# Patient Record
Sex: Male | Born: 1950 | Race: Black or African American | Hispanic: No | Smoking: Current every day smoker
Health system: Southern US, Community
[De-identification: ages and names within clinical notes are randomized; demographics above are authoritative.]

## PROBLEM LIST (undated history)

## (undated) DIAGNOSIS — M1990 Unspecified osteoarthritis, unspecified site: Secondary | ICD-10-CM

## (undated) DIAGNOSIS — I7389 Other specified peripheral vascular diseases: Secondary | ICD-10-CM

## (undated) DIAGNOSIS — C3490 Malignant neoplasm of unspecified part of unspecified bronchus or lung: Secondary | ICD-10-CM

## (undated) DIAGNOSIS — E119 Type 2 diabetes mellitus without complications: Secondary | ICD-10-CM

## (undated) DIAGNOSIS — D126 Benign neoplasm of colon, unspecified: Secondary | ICD-10-CM

## (undated) DIAGNOSIS — I10 Essential (primary) hypertension: Secondary | ICD-10-CM

## (undated) DIAGNOSIS — F17200 Nicotine dependence, unspecified, uncomplicated: Secondary | ICD-10-CM

## (undated) DIAGNOSIS — I351 Nonrheumatic aortic (valve) insufficiency: Secondary | ICD-10-CM

## (undated) DIAGNOSIS — I519 Heart disease, unspecified: Secondary | ICD-10-CM

## (undated) DIAGNOSIS — H269 Unspecified cataract: Secondary | ICD-10-CM

## (undated) DIAGNOSIS — K219 Gastro-esophageal reflux disease without esophagitis: Secondary | ICD-10-CM

## (undated) DIAGNOSIS — G454 Transient global amnesia: Secondary | ICD-10-CM

## (undated) DIAGNOSIS — G4733 Obstructive sleep apnea (adult) (pediatric): Principal | ICD-10-CM

## (undated) DIAGNOSIS — I4892 Unspecified atrial flutter: Secondary | ICD-10-CM

## (undated) DIAGNOSIS — E785 Hyperlipidemia, unspecified: Secondary | ICD-10-CM

## (undated) DIAGNOSIS — J449 Chronic obstructive pulmonary disease, unspecified: Secondary | ICD-10-CM

## (undated) DIAGNOSIS — A159 Respiratory tuberculosis unspecified: Secondary | ICD-10-CM

## (undated) DIAGNOSIS — L255 Unspecified contact dermatitis due to plants, except food: Secondary | ICD-10-CM

## (undated) HISTORY — DX: Unspecified cataract: H26.9

## (undated) HISTORY — PX: PARTIAL HIP ARTHROPLASTY: SHX733

## (undated) HISTORY — DX: Respiratory tuberculosis unspecified: A15.9

## (undated) HISTORY — DX: Transient global amnesia: G45.4

## (undated) HISTORY — DX: Benign neoplasm of colon, unspecified: D12.6

## (undated) HISTORY — DX: Unspecified osteoarthritis, unspecified site: M19.90

## (undated) HISTORY — DX: Unspecified atrial flutter: I48.92

## (undated) HISTORY — PX: POLYPECTOMY: SHX149

## (undated) HISTORY — PX: OTHER SURGICAL HISTORY: SHX169

## (undated) HISTORY — DX: Essential (primary) hypertension: I10

## (undated) HISTORY — DX: Chronic obstructive pulmonary disease, unspecified: J44.9

## (undated) HISTORY — DX: Hyperlipidemia, unspecified: E78.5

## (undated) HISTORY — DX: Other specified peripheral vascular diseases: I73.89

## (undated) HISTORY — DX: Nicotine dependence, unspecified, uncomplicated: F17.200

## (undated) HISTORY — DX: Nonrheumatic aortic (valve) insufficiency: I35.1

## (undated) HISTORY — DX: Obstructive sleep apnea (adult) (pediatric): G47.33

## (undated) HISTORY — DX: Unspecified contact dermatitis due to plants, except food: L25.5

## (undated) HISTORY — PX: COLONOSCOPY: SHX174

---

## 2000-01-20 HISTORY — PX: PARTIAL HIP ARTHROPLASTY: SHX733

## 2000-01-23 ENCOUNTER — Encounter: Payer: Self-pay | Admitting: Specialist

## 2000-01-28 ENCOUNTER — Encounter: Payer: Self-pay | Admitting: Specialist

## 2000-01-28 ENCOUNTER — Inpatient Hospital Stay (HOSPITAL_COMMUNITY): Admission: RE | Admit: 2000-01-28 | Discharge: 2000-01-31 | Payer: Self-pay | Admitting: Specialist

## 2000-01-31 ENCOUNTER — Encounter: Payer: Self-pay | Admitting: Specialist

## 2003-01-16 ENCOUNTER — Encounter: Payer: Self-pay | Admitting: Internal Medicine

## 2003-01-16 ENCOUNTER — Encounter: Admission: RE | Admit: 2003-01-16 | Discharge: 2003-01-16 | Payer: Self-pay | Admitting: Internal Medicine

## 2003-12-03 ENCOUNTER — Encounter: Admission: RE | Admit: 2003-12-03 | Discharge: 2003-12-03 | Payer: Self-pay | Admitting: Internal Medicine

## 2004-08-07 ENCOUNTER — Ambulatory Visit: Payer: Self-pay | Admitting: Internal Medicine

## 2004-08-11 ENCOUNTER — Ambulatory Visit: Payer: Self-pay | Admitting: Internal Medicine

## 2004-08-28 ENCOUNTER — Ambulatory Visit: Payer: Self-pay | Admitting: Cardiology

## 2004-09-19 ENCOUNTER — Ambulatory Visit: Payer: Self-pay | Admitting: Internal Medicine

## 2004-10-09 ENCOUNTER — Ambulatory Visit: Payer: Self-pay | Admitting: Gastroenterology

## 2004-10-23 ENCOUNTER — Ambulatory Visit: Payer: Self-pay | Admitting: Gastroenterology

## 2005-01-20 ENCOUNTER — Ambulatory Visit: Payer: Self-pay | Admitting: Cardiology

## 2005-05-29 ENCOUNTER — Ambulatory Visit: Payer: Self-pay | Admitting: Cardiology

## 2005-10-05 ENCOUNTER — Ambulatory Visit: Payer: Self-pay | Admitting: Cardiology

## 2005-10-19 ENCOUNTER — Ambulatory Visit: Payer: Self-pay | Admitting: Cardiology

## 2005-11-06 ENCOUNTER — Encounter (INDEPENDENT_AMBULATORY_CARE_PROVIDER_SITE_OTHER): Payer: Self-pay | Admitting: Family Medicine

## 2005-11-06 ENCOUNTER — Ambulatory Visit: Payer: Self-pay | Admitting: Internal Medicine

## 2005-11-06 LAB — CONVERTED CEMR LAB

## 2006-04-08 ENCOUNTER — Ambulatory Visit: Payer: Self-pay | Admitting: Cardiology

## 2006-08-02 ENCOUNTER — Ambulatory Visit: Payer: Self-pay | Admitting: Internal Medicine

## 2006-09-01 ENCOUNTER — Ambulatory Visit: Payer: Self-pay | Admitting: Cardiology

## 2006-11-25 ENCOUNTER — Ambulatory Visit: Payer: Self-pay | Admitting: Internal Medicine

## 2006-11-25 LAB — CONVERTED CEMR LAB
GFR calc Af Amer: 99 mL/min
GFR calc non Af Amer: 82 mL/min
Hgb A1c MFr Bld: 7.2 % — ABNORMAL HIGH (ref 4.6–6.0)
Potassium: 4.1 meq/L (ref 3.5–5.1)
Sodium: 142 meq/L (ref 135–145)

## 2006-12-06 ENCOUNTER — Ambulatory Visit: Payer: Self-pay | Admitting: Internal Medicine

## 2007-02-15 ENCOUNTER — Ambulatory Visit: Payer: Self-pay | Admitting: Internal Medicine

## 2007-02-15 LAB — CONVERTED CEMR LAB
Creatinine,U: 202.3 mg/dL
Hgb A1c MFr Bld: 6.8 % — ABNORMAL HIGH (ref 4.6–6.0)
Microalb Creat Ratio: 2.5 mg/g (ref 0.0–30.0)
Microalb, Ur: 0.5 mg/dL (ref 0.0–1.9)

## 2007-03-08 ENCOUNTER — Ambulatory Visit: Payer: Self-pay | Admitting: Cardiology

## 2007-03-08 LAB — CONVERTED CEMR LAB
AST: 18 units/L (ref 0–37)
Bilirubin, Direct: 0.1 mg/dL (ref 0.0–0.3)
HDL: 31.6 mg/dL — ABNORMAL LOW (ref >39.0)
LDL Cholesterol: 88 mg/dL (ref 0–99)
Triglycerides: 94 mg/dL (ref 0–149)
VLDL: 19 mg/dL (ref 0–40)

## 2007-03-18 ENCOUNTER — Ambulatory Visit: Payer: Self-pay | Admitting: Cardiology

## 2007-06-10 ENCOUNTER — Ambulatory Visit: Payer: Self-pay | Admitting: Internal Medicine

## 2007-06-10 LAB — CONVERTED CEMR LAB
Cholesterol, target level: 200 mg/dL
HDL goal, serum: 40 mg/dL

## 2007-06-12 LAB — CONVERTED CEMR LAB: Creatinine,U: 103.9 mg/dL

## 2007-06-13 ENCOUNTER — Encounter (INDEPENDENT_AMBULATORY_CARE_PROVIDER_SITE_OTHER): Payer: Self-pay | Admitting: *Deleted

## 2007-09-23 ENCOUNTER — Telehealth (INDEPENDENT_AMBULATORY_CARE_PROVIDER_SITE_OTHER): Payer: Self-pay | Admitting: *Deleted

## 2007-10-05 ENCOUNTER — Ambulatory Visit: Payer: Self-pay

## 2007-10-05 LAB — CONVERTED CEMR LAB
Alkaline Phosphatase: 52 units/L (ref 39–117)
Bilirubin, Direct: 0.1 mg/dL (ref 0.0–0.3)
Cholesterol: 130 mg/dL (ref 0–200)
LDL Cholesterol: 87 mg/dL (ref 0–99)
Total CHOL/HDL Ratio: 4.3
Total Protein: 7.1 g/dL (ref 6.0–8.3)

## 2007-10-25 ENCOUNTER — Telehealth (INDEPENDENT_AMBULATORY_CARE_PROVIDER_SITE_OTHER): Payer: Self-pay | Admitting: *Deleted

## 2007-10-26 ENCOUNTER — Encounter (INDEPENDENT_AMBULATORY_CARE_PROVIDER_SITE_OTHER): Payer: Self-pay | Admitting: Family Medicine

## 2007-11-16 ENCOUNTER — Encounter: Payer: Self-pay | Admitting: Internal Medicine

## 2007-11-20 HISTORY — PX: CYSTOSCOPY: SUR368

## 2007-11-22 ENCOUNTER — Ambulatory Visit: Payer: Self-pay | Admitting: Internal Medicine

## 2007-11-22 DIAGNOSIS — J441 Chronic obstructive pulmonary disease with (acute) exacerbation: Secondary | ICD-10-CM

## 2007-11-22 DIAGNOSIS — F17200 Nicotine dependence, unspecified, uncomplicated: Secondary | ICD-10-CM | POA: Insufficient documentation

## 2007-11-22 DIAGNOSIS — E785 Hyperlipidemia, unspecified: Secondary | ICD-10-CM

## 2007-11-24 ENCOUNTER — Encounter (INDEPENDENT_AMBULATORY_CARE_PROVIDER_SITE_OTHER): Payer: Self-pay | Admitting: *Deleted

## 2007-11-24 LAB — CONVERTED CEMR LAB
BUN: 7 mg/dL (ref 6–23)
Creatinine,U: 135.6 mg/dL
HCT: 44.3 % (ref 39.0–52.0)
Hgb A1c MFr Bld: 6.8 % — ABNORMAL HIGH (ref 4.6–6.0)
MCHC: 35.6 g/dL (ref 30.0–36.0)
Microalb Creat Ratio: 3.7 mg/g (ref 0.0–30.0)
Microalb, Ur: 0.5 mg/dL (ref 0.0–1.9)
WBC: 6.1 10*3/uL (ref 4.5–10.5)

## 2007-12-01 ENCOUNTER — Ambulatory Visit: Payer: Self-pay | Admitting: Internal Medicine

## 2007-12-05 ENCOUNTER — Encounter (INDEPENDENT_AMBULATORY_CARE_PROVIDER_SITE_OTHER): Payer: Self-pay | Admitting: *Deleted

## 2007-12-07 ENCOUNTER — Encounter: Payer: Self-pay | Admitting: Internal Medicine

## 2007-12-09 ENCOUNTER — Encounter (INDEPENDENT_AMBULATORY_CARE_PROVIDER_SITE_OTHER): Payer: Self-pay | Admitting: *Deleted

## 2007-12-09 ENCOUNTER — Ambulatory Visit: Payer: Self-pay | Admitting: Internal Medicine

## 2007-12-16 ENCOUNTER — Encounter: Payer: Self-pay | Admitting: Internal Medicine

## 2007-12-21 ENCOUNTER — Telehealth (INDEPENDENT_AMBULATORY_CARE_PROVIDER_SITE_OTHER): Payer: Self-pay | Admitting: *Deleted

## 2007-12-26 ENCOUNTER — Encounter: Payer: Self-pay | Admitting: Internal Medicine

## 2008-01-17 ENCOUNTER — Ambulatory Visit: Payer: Self-pay | Admitting: Internal Medicine

## 2008-01-24 ENCOUNTER — Encounter (INDEPENDENT_AMBULATORY_CARE_PROVIDER_SITE_OTHER): Payer: Self-pay | Admitting: *Deleted

## 2008-02-22 ENCOUNTER — Telehealth (INDEPENDENT_AMBULATORY_CARE_PROVIDER_SITE_OTHER): Payer: Self-pay | Admitting: *Deleted

## 2008-03-20 ENCOUNTER — Ambulatory Visit: Payer: Self-pay | Admitting: Internal Medicine

## 2008-03-27 ENCOUNTER — Encounter (INDEPENDENT_AMBULATORY_CARE_PROVIDER_SITE_OTHER): Payer: Self-pay | Admitting: *Deleted

## 2008-03-27 LAB — CONVERTED CEMR LAB: Hgb A1c MFr Bld: 6.7 % — ABNORMAL HIGH (ref 4.6–6.0)

## 2008-05-21 ENCOUNTER — Telehealth (INDEPENDENT_AMBULATORY_CARE_PROVIDER_SITE_OTHER): Payer: Self-pay | Admitting: *Deleted

## 2008-05-21 ENCOUNTER — Ambulatory Visit: Payer: Self-pay | Admitting: Internal Medicine

## 2008-05-21 DIAGNOSIS — I110 Hypertensive heart disease with heart failure: Secondary | ICD-10-CM

## 2008-05-21 DIAGNOSIS — I739 Peripheral vascular disease, unspecified: Secondary | ICD-10-CM | POA: Insufficient documentation

## 2008-05-21 LAB — CONVERTED CEMR LAB: LDL Goal: 70 mg/dL

## 2008-05-26 LAB — CONVERTED CEMR LAB
ALT: 11 units/L (ref 0–53)
AST: 12 units/L (ref 0–37)
Albumin: 3.9 g/dL (ref 3.5–5.2)
Alkaline Phosphatase: 50 units/L (ref 39–117)
Cholesterol: 177 mg/dL (ref 0–200)
Creatinine, Ser: 1.1 mg/dL (ref 0.4–1.5)
Creatinine,U: 318.5 mg/dL
HDL: 27.6 mg/dL — ABNORMAL LOW (ref >39.0)
TSH: 1.24 microintl units/mL (ref 0.35–5.50)
Triglycerides: 80 mg/dL (ref 0–149)

## 2008-05-29 ENCOUNTER — Telehealth (INDEPENDENT_AMBULATORY_CARE_PROVIDER_SITE_OTHER): Payer: Self-pay | Admitting: *Deleted

## 2008-05-31 ENCOUNTER — Telehealth (INDEPENDENT_AMBULATORY_CARE_PROVIDER_SITE_OTHER): Payer: Self-pay | Admitting: *Deleted

## 2008-06-29 ENCOUNTER — Telehealth (INDEPENDENT_AMBULATORY_CARE_PROVIDER_SITE_OTHER): Payer: Self-pay | Admitting: *Deleted

## 2008-08-27 ENCOUNTER — Ambulatory Visit: Payer: Self-pay | Admitting: Cardiovascular Disease

## 2008-08-30 ENCOUNTER — Ambulatory Visit: Payer: Self-pay | Admitting: Internal Medicine

## 2008-09-05 ENCOUNTER — Encounter (INDEPENDENT_AMBULATORY_CARE_PROVIDER_SITE_OTHER): Payer: Self-pay | Admitting: *Deleted

## 2008-09-05 LAB — CONVERTED CEMR LAB
ALT: 14 units/L (ref 0–53)
AST: 12 units/L (ref 0–37)
Bilirubin, Direct: 0.1 mg/dL (ref 0.0–0.3)
Cholesterol: 106 mg/dL (ref 0–200)
HDL: 28.2 mg/dL — ABNORMAL LOW (ref >39.0)
Hgb A1c MFr Bld: 6.5 % — ABNORMAL HIGH (ref 4.6–6.0)
Total Protein: 7.1 g/dL (ref 6.0–8.3)
VLDL: 14 mg/dL (ref 0–40)

## 2008-12-14 ENCOUNTER — Encounter: Payer: Self-pay | Admitting: Internal Medicine

## 2008-12-28 ENCOUNTER — Ambulatory Visit: Payer: Self-pay | Admitting: Family Medicine

## 2008-12-28 DIAGNOSIS — L255 Unspecified contact dermatitis due to plants, except food: Secondary | ICD-10-CM

## 2009-02-27 ENCOUNTER — Ambulatory Visit: Payer: Self-pay | Admitting: Internal Medicine

## 2009-03-03 LAB — CONVERTED CEMR LAB: Hgb A1c MFr Bld: 6.6 % — ABNORMAL HIGH (ref 4.6–6.5)

## 2009-03-04 ENCOUNTER — Encounter (INDEPENDENT_AMBULATORY_CARE_PROVIDER_SITE_OTHER): Payer: Self-pay | Admitting: *Deleted

## 2009-03-13 ENCOUNTER — Ambulatory Visit: Payer: Self-pay | Admitting: Internal Medicine

## 2009-05-06 ENCOUNTER — Ambulatory Visit: Payer: Self-pay | Admitting: Internal Medicine

## 2009-05-12 LAB — CONVERTED CEMR LAB
Creatinine,U: 65.5 mg/dL
Microalb Creat Ratio: 6.1 mg/g (ref 0.0–30.0)

## 2009-05-13 ENCOUNTER — Encounter (INDEPENDENT_AMBULATORY_CARE_PROVIDER_SITE_OTHER): Payer: Self-pay | Admitting: *Deleted

## 2009-05-24 ENCOUNTER — Encounter: Payer: Self-pay | Admitting: Internal Medicine

## 2009-08-19 ENCOUNTER — Ambulatory Visit: Payer: Self-pay | Admitting: Internal Medicine

## 2009-08-19 DIAGNOSIS — R21 Rash and other nonspecific skin eruption: Secondary | ICD-10-CM

## 2009-08-19 DIAGNOSIS — D485 Neoplasm of uncertain behavior of skin: Secondary | ICD-10-CM

## 2009-08-21 ENCOUNTER — Encounter (INDEPENDENT_AMBULATORY_CARE_PROVIDER_SITE_OTHER): Payer: Self-pay | Admitting: *Deleted

## 2009-08-21 LAB — CONVERTED CEMR LAB
Basophils Absolute: 0.1 10*3/uL (ref 0.0–0.1)
Basophils Relative: 1 % (ref 0.0–3.0)
Creatinine,U: 147.9 mg/dL
Eosinophils Relative: 3.4 % (ref 0.0–5.0)
HCT: 47.6 % (ref 39.0–52.0)
Hemoglobin: 15.9 g/dL (ref 13.0–17.0)
Lymphocytes Relative: 29.3 % (ref 12.0–46.0)
Lymphs Abs: 2.3 10*3/uL (ref 0.7–4.0)
Microalb Creat Ratio: 4.1 mg/g (ref 0.0–30.0)
Monocytes Relative: 14.9 % — ABNORMAL HIGH (ref 3.0–12.0)
Neutro Abs: 4 10*3/uL (ref 1.4–7.7)
RBC: 5.25 M/uL (ref 4.22–5.81)
RDW: 12.6 % (ref 11.5–14.6)

## 2009-08-27 ENCOUNTER — Encounter: Payer: Self-pay | Admitting: Internal Medicine

## 2009-09-25 ENCOUNTER — Encounter (INDEPENDENT_AMBULATORY_CARE_PROVIDER_SITE_OTHER): Payer: Self-pay | Admitting: *Deleted

## 2009-10-17 ENCOUNTER — Telehealth (INDEPENDENT_AMBULATORY_CARE_PROVIDER_SITE_OTHER): Payer: Self-pay | Admitting: *Deleted

## 2009-10-17 ENCOUNTER — Encounter (INDEPENDENT_AMBULATORY_CARE_PROVIDER_SITE_OTHER): Payer: Self-pay

## 2009-10-18 ENCOUNTER — Ambulatory Visit: Payer: Self-pay | Admitting: Gastroenterology

## 2009-10-21 ENCOUNTER — Telehealth: Payer: Self-pay | Admitting: Gastroenterology

## 2009-11-01 ENCOUNTER — Ambulatory Visit: Payer: Self-pay | Admitting: Gastroenterology

## 2009-11-01 LAB — HM COLONOSCOPY

## 2009-11-05 ENCOUNTER — Encounter: Payer: Self-pay | Admitting: Gastroenterology

## 2009-11-15 ENCOUNTER — Encounter (INDEPENDENT_AMBULATORY_CARE_PROVIDER_SITE_OTHER): Payer: Self-pay | Admitting: *Deleted

## 2009-12-26 ENCOUNTER — Emergency Department (HOSPITAL_COMMUNITY)
Admission: EM | Admit: 2009-12-26 | Discharge: 2009-12-27 | Payer: Self-pay | Source: Home / Self Care | Admitting: Emergency Medicine

## 2009-12-30 ENCOUNTER — Encounter (INDEPENDENT_AMBULATORY_CARE_PROVIDER_SITE_OTHER): Payer: Self-pay | Admitting: *Deleted

## 2009-12-30 ENCOUNTER — Ambulatory Visit: Payer: Self-pay | Admitting: Internal Medicine

## 2009-12-30 DIAGNOSIS — G454 Transient global amnesia: Secondary | ICD-10-CM

## 2009-12-30 DIAGNOSIS — E1159 Type 2 diabetes mellitus with other circulatory complications: Secondary | ICD-10-CM

## 2009-12-31 ENCOUNTER — Ambulatory Visit: Payer: Self-pay | Admitting: Internal Medicine

## 2010-01-02 ENCOUNTER — Ambulatory Visit: Payer: Self-pay | Admitting: Internal Medicine

## 2010-01-03 ENCOUNTER — Encounter: Payer: Self-pay | Admitting: Internal Medicine

## 2010-01-03 LAB — CONVERTED CEMR LAB
AST: 16 units/L (ref 0–37)
Alkaline Phosphatase: 56 units/L (ref 39–117)
Bilirubin, Direct: 0 mg/dL (ref 0.0–0.3)
HDL: 36.5 mg/dL — ABNORMAL LOW (ref >39.00)
Hgb A1c MFr Bld: 6.6 % — ABNORMAL HIGH (ref 4.6–6.5)
LDL Cholesterol: 54 mg/dL (ref 0–99)
Microalb Creat Ratio: 3.9 mg/g (ref 0.0–30.0)
TSH: 1.12 microintl units/mL (ref 0.35–5.50)
Total Bilirubin: 0.3 mg/dL (ref 0.3–1.2)
Total CHOL/HDL Ratio: 3
VLDL: 15 mg/dL (ref 0.0–40.0)

## 2010-01-07 ENCOUNTER — Ambulatory Visit: Payer: Self-pay | Admitting: Cardiovascular Disease

## 2010-01-14 ENCOUNTER — Encounter: Payer: Self-pay | Admitting: Internal Medicine

## 2010-01-29 ENCOUNTER — Encounter: Payer: Self-pay | Admitting: Internal Medicine

## 2010-02-05 ENCOUNTER — Encounter: Payer: Self-pay | Admitting: Internal Medicine

## 2010-02-19 ENCOUNTER — Ambulatory Visit: Payer: Self-pay

## 2010-02-19 ENCOUNTER — Encounter: Payer: Self-pay | Admitting: Cardiovascular Disease

## 2010-02-24 ENCOUNTER — Telehealth (INDEPENDENT_AMBULATORY_CARE_PROVIDER_SITE_OTHER): Payer: Self-pay | Admitting: *Deleted

## 2010-02-25 ENCOUNTER — Ambulatory Visit: Payer: Self-pay | Admitting: Internal Medicine

## 2010-02-25 DIAGNOSIS — R609 Edema, unspecified: Secondary | ICD-10-CM

## 2010-02-27 LAB — CONVERTED CEMR LAB
ALT: 13 units/L (ref 0–53)
AST: 14 units/L (ref 0–37)
BUN: 7 mg/dL (ref 6–23)

## 2010-03-06 ENCOUNTER — Ambulatory Visit: Payer: Self-pay | Admitting: Internal Medicine

## 2010-03-06 DIAGNOSIS — L0291 Cutaneous abscess, unspecified: Secondary | ICD-10-CM | POA: Insufficient documentation

## 2010-03-06 DIAGNOSIS — L0390 Cellulitis, unspecified: Secondary | ICD-10-CM

## 2010-03-06 DIAGNOSIS — L409 Psoriasis, unspecified: Secondary | ICD-10-CM

## 2010-03-18 ENCOUNTER — Ambulatory Visit: Payer: Self-pay | Admitting: Family Medicine

## 2010-03-18 ENCOUNTER — Ambulatory Visit: Payer: Self-pay | Admitting: Cardiovascular Disease

## 2010-03-18 DIAGNOSIS — J069 Acute upper respiratory infection, unspecified: Secondary | ICD-10-CM | POA: Insufficient documentation

## 2010-03-20 ENCOUNTER — Encounter: Payer: Self-pay | Admitting: Internal Medicine

## 2010-03-21 ENCOUNTER — Ambulatory Visit: Payer: Self-pay | Admitting: Cardiology

## 2010-03-21 HISTORY — PX: CARDIAC ELECTROPHYSIOLOGY MAPPING AND ABLATION: SHX1292

## 2010-03-25 ENCOUNTER — Ambulatory Visit: Payer: Self-pay | Admitting: Internal Medicine

## 2010-03-25 DIAGNOSIS — I519 Heart disease, unspecified: Secondary | ICD-10-CM | POA: Insufficient documentation

## 2010-03-25 LAB — CONVERTED CEMR LAB: POC INR: 1.7

## 2010-03-26 ENCOUNTER — Encounter: Payer: Self-pay | Admitting: Internal Medicine

## 2010-03-31 ENCOUNTER — Ambulatory Visit: Payer: Self-pay | Admitting: Internal Medicine

## 2010-03-31 ENCOUNTER — Ambulatory Visit: Payer: Self-pay | Admitting: Cardiology

## 2010-04-01 LAB — CONVERTED CEMR LAB
BUN: 13 mg/dL (ref 6–23)
Basophils Absolute: 0 10*3/uL (ref 0.0–0.1)
Basophils Relative: 0.5 % (ref 0.0–3.0)
Creatinine, Ser: 1.2 mg/dL (ref 0.4–1.5)
Eosinophils Absolute: 0.3 10*3/uL (ref 0.0–0.7)
GFR calc non Af Amer: 83.44 mL/min (ref >60)
Lymphocytes Relative: 20.7 % (ref 12.0–46.0)
MCHC: 33.4 g/dL (ref 30.0–36.0)
MCV: 88.6 fL (ref 78.0–100.0)
Monocytes Absolute: 0.9 10*3/uL (ref 0.1–1.0)
Neutro Abs: 5.3 10*3/uL (ref 1.4–7.7)
Neutrophils Relative %: 64.4 % (ref 43.0–77.0)
Potassium: 4.1 meq/L (ref 3.5–5.1)
Prothrombin Time: 14.4 s (ref 9.7–11.8)
RBC: 4.9 M/uL (ref 4.22–5.81)
RDW: 15.2 % — ABNORMAL HIGH (ref 11.5–14.6)
aPTT: 34.9 s (ref 21.7–28.8)

## 2010-04-02 ENCOUNTER — Other Ambulatory Visit: Payer: Self-pay | Admitting: Cardiology

## 2010-04-02 ENCOUNTER — Ambulatory Visit: Payer: Self-pay | Admitting: Internal Medicine

## 2010-04-02 ENCOUNTER — Ambulatory Visit: Payer: Self-pay | Admitting: Pulmonary Disease

## 2010-04-02 ENCOUNTER — Encounter: Payer: Self-pay | Admitting: Cardiology

## 2010-04-02 ENCOUNTER — Inpatient Hospital Stay (HOSPITAL_COMMUNITY)
Admission: RE | Admit: 2010-04-02 | Discharge: 2010-04-04 | Payer: Self-pay | Source: Home / Self Care | Admitting: Internal Medicine

## 2010-04-07 ENCOUNTER — Ambulatory Visit: Payer: Self-pay | Admitting: Cardiovascular Disease

## 2010-04-16 ENCOUNTER — Encounter: Payer: Self-pay | Admitting: Physician Assistant

## 2010-04-30 ENCOUNTER — Ambulatory Visit: Payer: Self-pay | Admitting: Cardiology

## 2010-04-30 ENCOUNTER — Encounter: Payer: Self-pay | Admitting: Physician Assistant

## 2010-05-09 ENCOUNTER — Ambulatory Visit: Payer: Self-pay | Admitting: Cardiology

## 2010-05-22 ENCOUNTER — Ambulatory Visit: Payer: Self-pay | Admitting: Internal Medicine

## 2010-06-05 ENCOUNTER — Ambulatory Visit: Payer: Self-pay | Admitting: Internal Medicine

## 2010-06-05 LAB — CONVERTED CEMR LAB: POC INR: 1.7

## 2010-06-23 ENCOUNTER — Telehealth: Payer: Self-pay | Admitting: Internal Medicine

## 2010-06-23 ENCOUNTER — Ambulatory Visit: Payer: Self-pay | Admitting: Cardiology

## 2010-06-23 ENCOUNTER — Encounter: Payer: Self-pay | Admitting: Internal Medicine

## 2010-06-23 ENCOUNTER — Ambulatory Visit: Payer: Self-pay | Admitting: Internal Medicine

## 2010-06-23 LAB — CONVERTED CEMR LAB: POC INR: 2

## 2010-08-08 ENCOUNTER — Ambulatory Visit: Payer: Self-pay | Admitting: Internal Medicine

## 2010-08-11 LAB — CONVERTED CEMR LAB
HDL: 33.1 mg/dL — ABNORMAL LOW (ref >39.00)
Triglycerides: 87 mg/dL (ref 0.0–149.0)
VLDL: 17.4 mg/dL (ref 0.0–40.0)

## 2010-10-23 NOTE — Assessment & Plan Note (Signed)
Summary: A Flutter  Medications Added VITAMIN E 400 UNIT CAPS (VITAMIN E) Take 1 capsule by mouth once a day CARDIZEM CD 240 MG XR24H-CAP (DILTIAZEM HCL COATED BEADS) take one capsule by mouth daily COUMADIN 5 MG TABS (WARFARIN SODIUM) take as directed        Visit Type:  Follow-up Primary Provider:  Marga Melnick MD  CC:  Atrial fibrillation.  History of Present Illness: 60 year-old male followed here for lower extremity extremity PAD, presenting today for evaluation of atrial flutter with RVR. He was seen by Dr Beverely Low this AM for sinus symptoms and was incidentally found to be in atrial flutter. He has felt 'jittery' for 3-4 days, but difficult to characterize his symptoms. Denies chest pain but has had some dyspnea with activity and reports cough as well.  No history of AF or other cardiac problems. He has chronic edema unchanged of late. No orthopnea or PND.    Current Medications (verified): 1)  Cilostazol 100 Mg  Tabs (Cilostazol) .[name].. 1 By Mouth Two Times A Day 2)  Fortamet 1000 Mg  Tb24 (Metformin Hcl) .[name].. 1 By Mouth Once Daily 3)  Advair Diskus 100-50 Mcg/dose  Misc (Fluticasone-Salmeterol) .... Bid 4)  Combivent 103-18 Mcg/act  Aero (Ipratropium-Albuterol) .[name].. 1-2 Sprays Q 4 Hours As Needed 5)  Gnp Flax Seed Oil 1000 Mg  Caps (Flaxseed (Linseed)) .... Daily 6)  Co Q-10 Vitamin E Fish Oil 60-90-25-200  Caps (Dha-Epa-Coenzyme Q10-Vitamin E) .... Take Daily 7)  Aspirin Ec 325 Mg Tbec (Aspirin) .... Take One Tablet By Mouth Daily 8)  Onetouch Ultra Test  Strp (Glucose Blood) .... Pt Test 4 Times A Day 9)  Crestor 20 Mg Tabs (Rosuvastatin Calcium) .[name].. 1 By Mouth Once Daily 10)  Onetouch Ultrasoft Lancets  Misc (Lancets) .... Test Twice Daily 11)  Ketoconazole 2 % Crea (Ketoconazole) .... Apply 2 Times Daily and Blow Dry 12)  Bactroban 2 % Crea (Mupirocin Calcium) .... Apply Two Times A Day After Cleansing 13)  Vitamin E 400 Unit Caps (Vitamin E) .... Take 1 Capsule By Mouth  Once A Day  Allergies: 1)  ! * Niaspan  Past History:  Past medical history reviewed for relevance to current acute and chronic problems.  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) RASH-NONVESICULAR (ICD-782.1) CONTACT DERMATITIS&OTHER ECZEMA DUE TO PLANTS (ICD-692.6) NONDEPENDENT TOBACCO USE DISORDER (ICD-305.1) BRONCHITIS, CHRONIC (ICD-491.9) Atrial flutter - onset 2011  Review of Systems       Negative except as per HPI   Vital Signs:  Patient profile:   60 year old male Height:      71 inches Weight:      142.75 pounds BMI:     19.98 Pulse rate:   139 / minute Pulse rhythm:   irregular Resp:     18 per minute BP sitting:   114 / 90  (left arm) Cuff size:   large  Vitals Entered By: Vikki Ports (March 18, 2010 2:33 PM)  Physical Exam  General:  Pt is alert and oriented, in no acute distress. HEENT: normal Neck: normal carotid upstrokes without bruits, JVP normal Lungs: CTA CV: tachycardic and regular without murmur or gallop Abd: soft, NT, positive BS, no bruit, no organomegaly Ext: 2+ bilateral pretibial edema. peripheral pulses 2+ and equal Skin: warm and  dry with chronic stasis changes, psoriatic patches both arms    EKG  Procedure date:  03/18/2010  Findings:      Atrial flutter with 2:1 block, ventricular rate 139 bpm.  Impression & Recommendations:  Problem # 1:  ATRIAL FLUTTER (ICD-427.32) The patient has new onset, symptomatic atrial flutter with RVR. This appears to be typical flutter, and may be amenable to catheter ablation. His risk factors for stroke include only vascular disease. I have recommended initiation of warfarin in  anticipation of either cardioversion or catheter ablation, initiation of long-acting diltiazem for an attempt at rate-control, and an EP consultation. I suspect his rate will be difficult to slow with AV nodal blocking agents.  We discussed warfarin in detail, including dietary and medication restrictions/interactions. He will be referred to the coumadin clinic.  His updated medication list for this problem includes:    Cilostazol 100 Mg Tabs (Cilostazol) .[name]... 1 by mouth two times a day    Aspirin Ec 325 Mg Tbec (Aspirin) .[name]... Take one tablet by mouth daily    Coumadin 5 Mg Tabs (Warfarin sodium) .[name]... Take as directed  Orders: EKG w/ Interpretation (93[address]) [facility] 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  Patient Instructions: 1)  Your physician has recommended you make the following change in your medication: START Cardizem CD 240mg  once a day, START Coumadin 5mg  once a day--please take this at the sametime everyday 2)  You have been referred to the Coumadin Clinic and to Dr Graciela Husbands  Prescriptions: CARDIZEM CD 240 MG XR24H-CAP (DILTIAZEM HCL COATED BEADS) take one capsule by mouth daily  #30 x 6   Entered by:   Julieta Gutting, RN, BSN   Authorized by:   Norva Karvonen, MD   Signed by:   Julieta Gutting, RN, BSN on 03/18/2010   Method used:   Electronically to        [facility] (retail)       [address]       [place], [place]  [id number]       Ph: [phone]       Fax: [phone]   RxID:   [id number]  10 tablets of Coumadin 5mg  (samples) were given to the pt during his office visit. Julieta Gutting, RN, BSN  March 18, 2010 3:28 PM

## 2010-10-23 NOTE — Miscellaneous (Signed)
Clinical Lists Changes  Observations: Added new observation of CXR RESULTS:  The lungs appear hyperinflated consistent with obstructive   pulmonary disease.    There is some atelectasis in the right middle lobe with inferior   position of minor fissure on PA image.     There is new patchy infiltrative density in the right perihilar   region extending into the right upper lobe.  No consolidation is   evident. This may reflect an area of bronchopneumonia.  Recommend   follow up imaging after therapy. (04/04/2010 11:06) Added new observation of ECHOINTERP:  - Left ventricle: The cavity size was normal. Wall thickness was     normal. Systolic function was borderline. The estimated ejection     fraction was in the range of 50% to 55%.   - Aortic valve: There was no stenosis. Trivial regurgitation.   - Aorta: Normal caliber aorta with mild descending thoracic aorta     plaque.   - Mitral valve: Trivial regurgitation.   - Left atrium: The atrium was mildly to moderately dilated. No     evidence of thrombus in the atrial cavity or appendage.   - Right ventricle: The cavity size was normal. Systolic function was     normal.   - Right atrium: The atrium was mildly dilated.   - Atrial septum: Small PFO with a few bubbles crossing with     Valsalva.   - Tricuspid valve: Peak RV-RA gradient: 28mm Hg (S).   Impressions:    - The patient was in rapid atrial flutter during this study. No LA     thrombus so may proceed with atrial flutter ablation.   Transesophageal echocardiography. 2D and color Doppler. Patient   status: Outpatient. Location: [facility] (04/02/2010 11:05)      Echocardiogram  Procedure date:  04/02/2010  Findings:       - Left ventricle: The cavity size was normal. Wall thickness was     normal. Systolic function was borderline. The estimated ejection     fraction was in the range of 50% to 55%.   - Aortic valve: There was no stenosis. Trivial regurgitation.   -  Aorta: Normal caliber aorta with mild descending thoracic aorta     plaque.   - Mitral valve: Trivial regurgitation.   - Left atrium: The atrium was mildly to moderately dilated. No     evidence of thrombus in the atrial cavity or appendage.   - Right ventricle: The cavity size was normal. Systolic function was     normal.   - Right atrium: The atrium was mildly dilated.   - Atrial septum: Small PFO with a few bubbles crossing with     Valsalva.   - Tricuspid valve: Peak RV-RA gradient: 28mm Hg (S).   Impressions:    - The patient was in rapid atrial flutter during this study. No LA     thrombus so may proceed with atrial flutter ablation.   Transesophageal echocardiography. 2D and color Doppler. Patient   status: Outpatient. Location: [facility]  CXR  Procedure date:  04/04/2010  Findings:       The lungs appear hyperinflated consistent with obstructive   pulmonary disease.    There is some atelectasis in the right middle lobe with inferior   position of minor fissure on PA image.     There is new patchy infiltrative density in the right perihilar   region extending into the right upper lobe.  No consolidation is  evident. This may reflect an area of bronchopneumonia.  Recommend   follow up imaging after therapy.

## 2010-10-23 NOTE — Progress Notes (Signed)
Summary: Refill Request  Phone Note Call from Patient   Caller: Patient Summary of Call: Medication concerns     Prescriptions: CILOSTAZOL 100 MG  TABS (CILOSTAZOL) 1 by mouth two times a day  #60 x 4   Entered by:   Shonna Chock   Authorized by:   Marga Melnick MD   Signed by:   Shonna Chock on 02/24/2010   Method used:   Electronically to        [facility] (retail)       [address]       [place], [place]  [id number]       Ph: [phone]       Fax: [phone]   RxID:   [id number]

## 2010-10-23 NOTE — Letter (Signed)
Summary: Out of Work  [facility] [employer]  [address] [place], [place] [address]   Phone: [phone]  Fax: [phone]    December 30, 2009   Employee:  Neil Carroll    To Whom It May Concern:   For Medical reasons, please excuse the above named employee from work for the following dates:  Start:   April 8,2011  End:   April 18,2011  If you need additional information, please feel free to contact our office.         Sincerely,    Chrae Malloy  Appended Document: Out of Work Faxed yesterday to [phone] per patient's request

## 2010-10-23 NOTE — Assessment & Plan Note (Signed)
Summary: BOTH ANKLES AND LEGS SWOLLEN////SPH   Vital Signs:  Patient profile:   60 year old male Weight:      256.6 pounds Temp:     99.0 degrees F oral Pulse rate:   76 / minute Resp:     17 per minute BP sitting:   128 / 72  (left arm) Cuff size:   large  Vitals Entered By: Shonna Chock (February 25, 2010 10:48 AM) CC: Ankle/Leg swelling X 2 weeks Comments REVIEWED MED LIST, PATIENT AGREED DOSE AND INSTRUCTION CORRECT    Primary Care Provider:  Marga Melnick MD  CC:  Ankle/Leg swelling X 2 weeks.  History of Present Illness: Edema X 2 weeks ; he is unsure as to increased  salt intake. He has been on  Amlodipine for 7-8 years w/o significant edema.  Allergies: 1)  ! * Niaspan  Review of Systems CV:  Complains of leg cramps with exertion and shortness of breath with exertion; denies bluish discoloration of lips or nails, difficulty breathing at night, difficulty breathing while lying down, and swelling of hands; DOE after walking 5-10 min. Resp:  Denies excessive snoring, hypersomnolence, and morning headaches.  Physical Exam  General:  in no acute distress; alert,appropriate and cooperative throughout examination; strong tobacco ordor present Lungs:  Normal respiratory effort, chest expands symmetrically. Lungs are clear to auscultation, no crackles or wheezes. Heart:  normal rate, regular rhythm, no gallop, no rub, no JVD, no HJR, and grade 1 /6 systolic murmur.   Abdomen:  Bowel sounds positive,abdomen soft and non-tender without masses, organomegaly or hernias noted. Pulses:  R and L dorsalis pedis and posterior tibial pulses are full and equal bilaterally Extremities:  2+ left pedal edema and 2+ right pedal edema.   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 1 by mouth two times a day 2)  Fortamet 1000 Mg Tb24 (Metformin hcl) .[name].. 1 by mouth once daily 3)  Advair Diskus 100-50 Mcg/dose Misc (Fluticasone-salmeterol) .... Bid 4)  Combivent 103-18 Mcg/act Aero (Ipratropium-albuterol) .[name].. 1-2 sprays q 4 hours as needed 5)  Gnp Flax Seed Oil 1000 Mg Caps (Flaxseed (linseed)) .... Daily 6)  Co Q-10 Vitamin E Fish Oil 60-90-25-200 Caps (Dha-epa-coenzyme q10-vitamin e) .... Take daily 7)  Aspirin Ec 325 Mg Tbec (Aspirin) .... Take one tablet by mouth daily 8)  Onetouch Ultra Test Strp (Glucose blood) .... Pt test 4 times a day 9)  Crestor 20 Mg Tabs (Rosuvastatin calcium) .[name].. 1 by mouth once daily 10)  Onetouch Ultrasoft Lancets Misc (Lancets) .... Test twice daily 11)  Ketoconazole 2 % Crea (Ketoconazole) .... Apply 2 times daily and blow dry  Patient Instructions: 1)  Limit your Sodium (Salt).Limit your Sodium (Salt) to less than 4 grams a day (slightly less than 1 teaspoon) to prevent fluid retention, swelling, or worsening or symptoms.Check your Blood Pressure  OFF Amlodipine regularly. If it is above:140/90 ON AVERAGE  you should make an appointment.

## 2010-10-23 NOTE — Consult Note (Signed)
Summary: Duard Larsen MD Dermatology  Duard Larsen MD Dermatology   Imported By: Lanelle Bal 11/04/2009 13:41:00  _____________________________________________________________________  External Attachment:    Type:   Image     Comment:   External Document

## 2010-10-23 NOTE — Medication Information (Signed)
Summary: Coumadin Clinic  Anticoagulant Therapy  Managed by: Inactive Referring MD: Excell Seltzer PCP: Marga Melnick MD Supervising MD: Gala Romney MD, Reuel Boom Indication 1: Atrial Flutter Lab Used: [facility] [place] Site: [address] INR RANGE 2.0-3.0          Comments: Coumadin d/c on 06/23/10 at [facility] with Dr Graciela Husbands. Cloyde Reams RN  June 23, 2010 4:16 PM   Allergies: 1)  ! * Niaspan  Anticoagulation Management History:      Positive risk factors for bleeding include presence of serious comorbidities.  Negative risk factors for bleeding include an age less than 18 years old.  The bleeding index is 'intermediate risk'.  Positive CHADS2 values include History of CHF, History of HTN, and History of Diabetes.  Negative CHADS2 values include Age > 57 years old.  His last INR was 1.346 ratio.  Anticoagulation responsible provider: Bensimhon MD, Reuel Boom.  Exp: 07/2011.    Anticoagulation Management Assessment/Plan:      The patient's current anticoagulation dose is Coumadin 5 mg tabs: take as directed.  The target INR is 2.0-3.0.  The next INR is due 07/14/2010.  Anticoagulation instructions were given to patient.  Results were reviewed/authorized by Inactive.         Prior Anticoagulation Instructions: INR 2.0  Start taking 1.5 tablets daily. Recheck in 3 weeks.

## 2010-10-23 NOTE — Medication Information (Signed)
Summary: rov/sp  Anticoagulant Therapy  Managed by: Cloyde Reams, RN, BSN Referring MD: Excell Seltzer PCP: Marga Melnick MD Supervising MD: Graciela Husbands MD, Viviann Spare Indication 1: Atrial Flutter Lab Used: [facility] [place] Site: [address] INR POC 1.7 INR RANGE 2.0-3.0     Recent/future hospitalizations: no    Any changes in medication regimen? yes       Details: Hasn't started Doxycycline      Allergies: 1)  ! * Niaspan  Anticoagulation Management History:      The patient is taking warfarin and comes in today for a routine follow up visit.  Positive risk factors for bleeding include presence of serious comorbidities.  Negative risk factors for bleeding include an age less than 43 years old.  The bleeding index is 'intermediate risk'.  Positive CHADS2 values include History of HTN and History of Diabetes.  Negative CHADS2 values include Age > 86 years old.  Anticoagulation responsible provider: Graciela Husbands MD, Viviann Spare.  INR POC: 1.7.  Cuvette Lot#: [id number].  Exp: 05/2011.    Anticoagulation Management Assessment/Plan:      The patient's current anticoagulation dose is Coumadin 5 mg tabs: take as directed.  The target INR is 2.0-3.0.  The next INR is due 04/01/2010.  Anticoagulation instructions were given to patient.  Results were reviewed/authorized by Cloyde Reams, RN, BSN.  He was notified by Cloyde Reams RN.         Prior Anticoagulation Instructions: INR 1.1  Take 1 1/2 tablets today and tomorrow then resume same dose of 1 tablet every day.    Current Anticoagulation Instructions: INR 1.7  Take 1.5 tablets today. Start taking 1 tablet daily except 1.5 tablets on Mondays and Fridays.  Recheck in 1 week.

## 2010-10-23 NOTE — Letter (Signed)
Summary: Moviprep Instructions  [facility]  [address]   [place], [place] [address]   Phone: [phone]  Fax: [phone]       Neil Carroll    June 26, 1951    MRN: [id number]        Procedure Day /Date: Friday 11-01-09       Arrival Time: 8:00 a.m.     Procedure Time: 9:00 a.m.     Location of Procedure:                    _x _  [facility] (4th Floor)   PREPARATION FOR COLONOSCOPY WITH MOVIPREP   Starting 5 days prior to your procedure 10-27-09 do not eat nuts, seeds, popcorn, corn, beans, peas,  salads, or any raw vegetables.  Do not take any fiber supplements (e.g. Metamucil, Citrucel, and Benefiber).  THE DAY BEFORE YOUR PROCEDURE         DATE: 10-31-09   DAY: Thursday  1.  Drink clear liquids the entire day-NO SOLID FOOD  2.  Do not drink anything colored red or purple.  Avoid juices with pulp.  No orange juice.  3.  Drink at least 64 oz. (8 glasses) of fluid/clear liquids during the day to prevent dehydration and help the prep work efficiently.  CLEAR LIQUIDS INCLUDE: Water Jello Ice Popsicles Tea (sugar ok, no milk/cream) Powdered fruit flavored drinks Coffee (sugar ok, no milk/cream) Gatorade Juice: apple, white grape, white cranberry  Lemonade Clear bullion, consomm, broth Carbonated beverages (any kind) Strained chicken noodle soup Hard Candy                             4.  In the morning, mix first dose of MoviPrep solution:    Empty 1 Pouch A and 1 Pouch B into the disposable container    Add lukewarm drinking water to the top line of the container. Mix to dissolve    Refrigerate (mixed solution should be used within 24 hrs)  5.  Begin drinking the prep at 5:00 p.m. The MoviPrep container is divided by 4 marks.   Every 15 minutes drink the solution down to the next mark (approximately 8 oz) until the full liter is complete.   6.  Follow completed prep with 16 oz of clear liquid of your choice (Nothing red or  purple).  Continue to drink clear liquids until bedtime.  7.  Before going to bed, mix second dose of MoviPrep solution:    Empty 1 Pouch A and 1 Pouch B into the disposable container    Add lukewarm drinking water to the top line of the container. Mix to dissolve    Refrigerate  THE DAY OF YOUR PROCEDURE      DATE: 11-01-09   DAY: Friday  Beginning at  4:00 a.m. (5 hours before procedure):         1. Every 15 minutes, drink the solution down to the next mark (approx 8 oz) until the full liter is complete.  2. Follow completed prep with 16 oz. of clear liquid of your choice.    3. You may drink clear liquids until   7:00 a.m. (2 HOURS BEFORE PROCEDURE).   MEDICATION INSTRUCTIONS  Unless otherwise instructed, you should take regular prescription medications with a small sip of water   as early as possible the morning of your procedure         OTHER INSTRUCTIONS  You will need a responsible adult at least 61 years of age to accompany you and drive you home.   This person must remain in the waiting room during your procedure.  Wear loose fitting clothing that is easily removed.  Leave jewelry and other valuables at home.  However, you may wish to bring a book to read or  an iPod/MP3 player to listen to music as you wait for your procedure to start.  Remove all body piercing jewelry and leave at home.  Total time from sign-in until discharge is approximately 2-3 hours.  You should go home directly after your procedure and rest.  You can resume normal activities the  day after your procedure.  The day of your procedure you should not:   Drive   Make legal decisions   Operate machinery   Drink alcohol   Return to work  You will receive specific instructions about eating, activities and medications before you leave.    The above instructions have been reviewed and explained to me by   Ulis Rias RN  October 18, 2009 9:09 A     I fully understand and can  verbalize these instructions _____________________________ Date _________

## 2010-10-23 NOTE — Medication Information (Signed)
Summary: rov/tm  Anticoagulant Therapy  Managed by: Reina Fuse, PharmD Referring MD: Excell Seltzer PCP: Marga Melnick MD Supervising MD: Shirlee Latch MD, Dalton Indication 1: Atrial Flutter Lab Used: [facility] [place] Site: [address] INR POC 1.2 INR RANGE 2.0-3.0  Dietary changes: no    Health status changes: no    Bleeding/hemorrhagic complications: no    Recent/future hospitalizations: no    Any changes in medication regimen? no    Recent/future dental: no  Any missed doses?: no       Is patient compliant with meds? yes       Current Medications (verified): 1)  Cilostazol 100 Mg  Tabs (Cilostazol) .[name].. 1 By Mouth Two Times A Day 2)  Fortamet 1000 Mg  Tb24 (Metformin Hcl) .[name].. 1 By Mouth Once Daily 3)  Advair Diskus 100-50 Mcg/dose  Misc (Fluticasone-Salmeterol) .... Bid 4)  Gnp Flax Seed Oil 1000 Mg  Caps (Flaxseed (Linseed)) .... Daily 5)  Co Q-10 Vitamin E Fish Oil 60-90-25-200  Caps (Dha-Epa-Coenzyme Q10-Vitamin E) .... Take Daily 6)  Aspirin Ec 325 Mg Tbec (Aspirin) .... Take One Tablet By Mouth Daily 7)  Onetouch Ultra Test  Strp (Glucose Blood) .... Pt Test 4 Times A Day 8)  Crestor 20 Mg Tabs (Rosuvastatin Calcium) .[name].. 1 By Mouth Once Daily 9)  Onetouch Ultrasoft Lancets  Misc (Lancets) .... Test Twice Daily 10)  Ketoconazole 2 % Crea (Ketoconazole) .... Apply 2 Times Daily and Blow Dry 11)  Bactroban 2 % Crea (Mupirocin Calcium) .... Apply Two Times A Day After Cleansing 12)  Vitamin E 400 Unit Caps (Vitamin E) .... Take 1 Capsule By Mouth Once A Day 13)  Cardizem Cd 240 Mg Xr24h-Cap (Diltiazem Hcl Coated Beads) .... Take One Capsule By Mouth Daily 14)  Coumadin 5 Mg Tabs (Warfarin Sodium) .... Take As Directed  Allergies (verified): 1)  ! * Niaspan  Anticoagulation Management History:      The patient is taking warfarin and comes in today for a routine follow up visit.  Positive risk factors for bleeding include presence of serious comorbidities.   Negative risk factors for bleeding include an age less than 11 years old.  The bleeding index is 'intermediate risk'.  Positive CHADS2 values include History of CHF, History of HTN, and History of Diabetes.  Negative CHADS2 values include Age > 81 years old.  His last INR was 1.346 ratio.  Anticoagulation responsible [name]: Shirlee Latch MD, Dalton.  INR POC: 1.2.  Cuvette Lot#: [id number].  Exp: 05/2011.    Anticoagulation Management Assessment/Plan:      The patient's current anticoagulation dose is Coumadin 5 mg tabs: take as directed.  The target INR is 2.0-3.0.  The next INR is due 05/09/2010.  Anticoagulation instructions were given to patient.  Results were reviewed/authorized by Reina Fuse, PharmD.  He was notified by Reina Fuse PharmD.         Prior Anticoagulation Instructions: INR 2.4 Continue 1 pill everyday except 1.5 pills on Mondays, Wednesdays and Fridays. Recheck in 3 weeks per Pharm D.   Current Anticoagulation Instructions: INR 1.2  Today, August 10th take Coumadin 2 tabs (10 mg). Tomorrow, August 11th take Coumadin 1.5 tabs (7.5 mg).  Then take Coumadin 1.5 tabs (7.5 mg) on Sun, Mon, Wed, Fri and Coumadin 1 tab (5 mg) on Tues, Thur, Sat. Return to clinic in 1 week.

## 2010-10-23 NOTE — Assessment & Plan Note (Signed)
Summary: eph/post ablation   Visit Type:  Post-hospital Primary Neil Carroll:  Marga Melnick MD  CC:  No complains.  History of Present Illness: This is a 60 year old male patient who recently underwent ablation for atrial flutter April 02, 2010. He did have some hemoptysis while on Coumadin and was seen by pulmonology. Repeat chest x-ray showed some scarring but no new infiltrates. Pulmonary felt the hemoptysis was related to trauma after the transesophageal echo guided ablation. His symptoms resolved.  The patient has done well since discharge. He denies chest pain, palpitations, dyspnea, dyspnea on exertion, dizziness, or presyncope. He does continue to smoke but has cut back from 2 packs a day down to three quarters of a pack a day.  Preventive Screening-Counseling & Management  Alcohol-Tobacco     Smoking Status: current     Smoking Cessation Counseling: yes     Smoke Cessation Stage: ready     Packs/Day: 0.75  Current Medications (verified): 1)  Cilostazol 100 Mg  Tabs (Cilostazol) .[name].. 1 By Mouth Two Times A Day 2)  Fortamet 1000 Mg  Tb24 (Metformin Hcl) .[name].. 1 By Mouth Once Daily 3)  Advair Diskus 100-50 Mcg/dose  Misc (Fluticasone-Salmeterol) .... Bid 4)  Gnp Flax Seed Oil 1000 Mg  Caps (Flaxseed (Linseed)) .... Daily 5)  Co Q-10 Vitamin E Fish Oil 60-90-25-200  Caps (Dha-Epa-Coenzyme Q10-Vitamin E) .... Take Daily 6)  Aspirin Ec 325 Mg Tbec (Aspirin) .... Take One Tablet By Mouth Daily 7)  Onetouch Ultra Test  Strp (Glucose Blood) .... Pt Test 4 Times A Day 8)  Crestor 20 Mg Tabs (Rosuvastatin Calcium) .[name].. 1 By Mouth Once Daily 9)  Onetouch Ultrasoft Lancets  Misc (Lancets) .... Test Twice Daily 10)  Ketoconazole 2 % Crea (Ketoconazole) .... Apply 2 Times Daily and Blow Dry 11)  Bactroban 2 % Crea (Mupirocin Calcium) .... Apply Two Times A Day After Cleansing 12)  Vitamin E 400 Unit Caps (Vitamin E) .... Take 1 Capsule By Mouth Once A Day 13)  Metoprolol Tartrate 50 Mg Tabs  (Metoprolol Tartrate) .... Take One Tablet By Mouth Twice A Day 14)  Coumadin 5 Mg Tabs (Warfarin Sodium) .... Take As Directed 15)  Soriatane 10 Mg Caps (Acitretin) .... Once A Day  Allergies: 1)  ! 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)- Bilateral lower extremity OTHER AND UNSPECIFIED HYPERLIPIDEMIA (ICD-272.4) UNSPECIFIED ESSENTIAL HYPERTENSION (ICD-401.9) HYPERLIPIDEMIA (ICD-272.4) DIABETES MELLITUS, TYPE II (ICD-250.00) AMNESIA, TRANSIENT GLOBAL (ICD-437.7) NEOPLASM OF UNCERTAIN BEHAVIOR OF SKIN (ICD-238.2) RASH-NONVESICULAR (ICD-782.1) CONTACT DERMATITIS&OTHER ECZEMA DUE TO PLANTS (ICD-692.6) NONDEPENDENT TOBACCO USE DISORDER (ICD-305.1) BRONCHITIS, CHRONIC (ICD-491.9) Atrial flutter - onset 2011  Social History: Packs/Day:  0.75  Review of Systems       see history of present illness  Vital Signs:  Patient profile:   60 year old male Height:      71 inches Weight:      238 pounds BMI:     33.31 Pulse rate:   60 / minute Pulse rhythm:   regular Resp:     18 per minute BP sitting:   144 / 80  (left arm) Cuff size:   large  Vitals Entered By: Vikki Ports (April 30, 2010 8:27 AM)  Physical Exam  General:   Well-nournished, in no acute distress. Neck: No JVD, HJR, Bruit, or thyroid enlargement Lungs: No tachypnea, clear without wheezing, rales, or rhonchi Cardiovascular: RRR, PMI not displaced, heart sounds normal, no  murmurs, gallops, bruit, thrill, or heave. Abdomen: BS normal. Soft without organomegaly, masses, lesions or tenderness. Extremities: Right and left groins without hematoma or hemorrhage, lower extremities without cyanosis, clubbing or edema. Good distal pulses bilateral SKin:Significant psoriasis over his entire body   Musculoskeletal: No deformities Neuro: no focal signs    EKG  Procedure date:  04/30/2010  Findings:      Sinus bradycardia 58  beats per minute otherwise normal  Impression & Recommendations:  Problem # 1:  ATRIAL FLUTTER (ICD-427.32) Patient had atrial flutter ablation April 02, 2010. He has maintained normal sinus rhythm and is doing well.The patient did have hemoptysis while on Coumadin in the hospital which was felt secondary to trauma due to the transesophageal echo. This has resolved.Continue Coumadin for now. His updated medication list for this problem includes:    Cilostazol 100 Mg Tabs (Cilostazol) .[name]... 1 by mouth two times a day    Aspirin Ec 325 Mg Tbec (Aspirin) .[name]... Take one tablet by mouth daily    Metoprolol Tartrate 50 Mg Tabs (Metoprolol tartrate) .[name]... Take one tablet by mouth twice a day    Coumadin 5 Mg Tabs (Warfarin sodium) .[name]... Take as directed  Orders: EKG w/ Interpretation (93000)  Problem # 2:  CHF (ICD-428.0) CHF has resolved ejection fraction 50-55% His updated medication list for this problem includes:    Cilostazol 100 Mg Tabs (Cilostazol) .[name]... 1 by mouth two times a day    Aspirin Ec 325 Mg Tbec (Aspirin) .[name]... Take one tablet by mouth daily    Metoprolol Tartrate 50 Mg Tabs (Metoprolol tartrate) .[name]... Take one tablet by mouth twice a day    Coumadin 5 Mg Tabs (Warfarin sodium) .[name]... Take as directed  Problem # 3:  NONDEPENDENT TOBACCO USE DISORDER (ICD-305.1) Patient continues to smoke three quarters of a pack of cigarettes a day. I did discuss with him the importance of smoking cessation.  Patient Instructions: 1)  Your physician recommends that you schedule a follow-up appointment in: 2 months with Dr. Graciela Husbands. 2)  Your MD recommeds for you to stop smoking.

## 2010-10-23 NOTE — Medication Information (Signed)
Summary: Nonadherence with Combivent & Advair/BCBS  Nonadherence with Combivent & Advair/BCBS   Imported By: Lanelle Bal 01/20/2010 11:39:56  _____________________________________________________________________  External Attachment:    Type:   Image     Comment:   External Document

## 2010-10-23 NOTE — Progress Notes (Signed)
Summary: refill  Phone Note Refill Request Message from:  Fax from Pharmacy on [facility] fax [phone]  Refills Requested: Medication #1:  CILOSTAZOL 100 MG  TABS 1 by mouth qd  Medication #2:  AMLODIPINE BESYLATE 5 MG  TABS 1 by mouth qd  Medication #3:  CRESTOR 20 MG TABS 1qd Initial call taken by: Barb Merino,  October 17, 2009 8:44 AM    Prescriptions: CRESTOR 20 MG TABS (ROSUVASTATIN CALCIUM) 1qd  #30 Tablet x 0   Entered by:   Shonna Chock   Authorized by:   Marga Melnick MD   Signed by:   Shonna Chock on 10/17/2009   Method used:   Electronically to        [facility] [phone]* (retail)       [address] [place], [place]  [address]       Ph: [phone]       Fax: [phone]   RxID:   [id number] AMLODIPINE BESYLATE 5 MG  TABS (AMLODIPINE BESYLATE) 1 by mouth qd  #30 Tablet x 2   Entered by:   Shonna Chock   Authorized by:   Marga Melnick MD   Signed by:   Shonna Chock on 10/17/2009   Method used:   Electronically to        [facility] [phone]* (retail)       [address] [place], [place]  [address]       Ph: [phone]       Fax: [phone]   RxID:   [id number] CILOSTAZOL 100 MG  TABS (CILOSTAZOL) 1 by mouth qd  #60 Tablet x 2   Entered by:   Shonna Chock   Authorized by:   Marga Melnick MD   Signed by:   Shonna Chock on 10/17/2009   Method used:   Electronically to        [facility] [phone]* (retail)       [address] [place], [place]  [address]       Ph: [phone]       Fax: [phone]   RxID:   [id number]

## 2010-10-23 NOTE — Progress Notes (Signed)
Summary: Pletal   ---- Converted from flag ---- ---- 10/19/2009 8:13 PM, Louis Meckel MD wrote: ok to stay on pletal  ---- 10/18/2009 5:58 PM, Ulis Rias RN wrote: Dr Arlyce Dice, This pt is on Cilostazol (Pletal) and is having a colonoscopy on 11/01/09 for hx of polyps.The pt doesn't know why he is taking the med.Do we need to hold the med for a number of days or contact his PCP regarding keeping him on it.Please advise. I will be out of the office until Tuesday 10/22/09 but I want to make sure this gets taken care of. I sent you a message earlier.Thanks. ------------------------------  Left message on pt. answering machine to call us back.  No ID on machine. Alvino Chapel  Appended Document: Pletal Pt called our office back.  I reviewed  the medication with pt and did advise he could stay on the Pletal prior to the colonoscopy,  per Dr Arlyce Dice. Pt states he understood.

## 2010-10-23 NOTE — Assessment & Plan Note (Signed)
Summary: POSSIBLE SINUS PROBLEMS//KN   Vital Signs:  Patient profile:   60 year old male Weight:      242 pounds Temp:     96.8 degrees F oral BP sitting:   130 / 80  (left arm)  Vitals Entered By: Doristine Devoid (March 18, 2010 12:05 PM) CC: sinus congestion and pressure also little drainage   History of Present Illness: 60 yo man here today for sinus congestion and pressure.  sxs started Friday evening w/ nasal congestion.  no fevers.  + cough this AM but none since, productive of brown sputum early in AM but then white.  no ear pain.  no facial pain or pressure.  no known sick contacts.  feeling improved today.  just completed abx course (augmentin) on Sunday.  Current Medications (verified): 1)  Cilostazol 100 Mg  Tabs (Cilostazol) .[name].. 1 By Mouth Two Times A Day 2)  Fortamet 1000 Mg  Tb24 (Metformin Hcl) .[name].. 1 By Mouth Once Daily 3)  Advair Diskus 100-50 Mcg/dose  Misc (Fluticasone-Salmeterol) .... Bid 4)  Combivent 103-18 Mcg/act  Aero (Ipratropium-Albuterol) .[name].. 1-2 Sprays Q 4 Hours As Needed 5)  Gnp Flax Seed Oil 1000 Mg  Caps (Flaxseed (Linseed)) .... Daily 6)  Co Q-10 Vitamin E Fish Oil 60-90-25-200  Caps (Dha-Epa-Coenzyme Q10-Vitamin E) .... Take Daily 7)  Aspirin Ec 325 Mg Tbec (Aspirin) .... Take One Tablet By Mouth Daily 8)  Onetouch Ultra Test  Strp (Glucose Blood) .... Pt Test 4 Times A Day 9)  Crestor 20 Mg Tabs (Rosuvastatin Calcium) .[name].. 1 By Mouth Once Daily 10)  Onetouch Ultrasoft Lancets  Misc (Lancets) .... Test Twice Daily 11)  Ketoconazole 2 % Crea (Ketoconazole) .... Apply 2 Times Daily and Blow Dry 12)  Bactroban 2 % Crea (Mupirocin Calcium) .... Apply Two Times A Day After Cleansing 13)  Amoxicillin-Pot Clavulanate 500-125 Mg Tabs (Amoxicillin-Pot Clavulanate) .[name].. 1 Every 12 Hrs With A Meal  Allergies (verified): 1)  ! 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  He drinks at least three beers per day.  He smokes one pack of cigarettes per day.   He is  working as a Water quality scientist      See HPI   Impression & Recommendations:  Problem # 1:  URI (ICD-465.9) Assessment New  His updated medication list for this problem includes:    Aspirin Ec 325 Mg Tbec (Aspirin) .[name]... Take one tablet by mouth daily  Problem # 2:  ATRIAL FIBRILLATION (ICD-427.31) Assessment: New  His updated medication list for this problem includes:    Cilostazol 100 Mg Tabs (Cilostazol) .[name]... 1 by mouth two times a day    Aspirin Ec 325 Mg Tbec (Aspirin) .[name]... Take one tablet by mouth daily  Orders: Cardiology Referral (Cardiology)  Complete Medication List: 1)  Cilostazol 100 Mg Tabs (Cilostazol) .[name].. 1 by mouth two times a day 2)  Fortamet 1000 Mg Tb24 (Metformin hcl) .[name].. 1 by mouth once daily 3)  Advair Diskus 100-50 Mcg/dose Misc (Fluticasone-salmeterol) .... Bid 4)  Combivent 103-18 Mcg/act  Aero (Ipratropium-albuterol) .[name].. 1-2 sprays q 4 hours as needed 5)  Gnp Flax Seed Oil 1000 Mg Caps (Flaxseed (linseed)) .... Daily 6)  Co Q-10 Vitamin E Fish Oil 60-90-25-200 Caps (Dha-epa-coenzyme q10-vitamin e) .... Take daily 7)  Aspirin Ec 325 Mg Tbec (Aspirin) .... Take one tablet by mouth daily 8)  Onetouch Ultra Test Strp (Glucose blood) .... Pt test 4 times a day 9)  Crestor 20 Mg Tabs (Rosuvastatin calcium) .[name].. 1 by mouth once  daily 10)  Onetouch Ultrasoft Lancets Misc (Lancets) .... Test twice daily 11)  Ketoconazole 2 % Crea (Ketoconazole) .... Apply 2 times daily and blow dry 12)  Bactroban 2 % Crea (Mupirocin calcium) .... Apply two times a day after cleansing 13)  Amoxicillin-pot Clavulanate 500-125 Mg Tabs (Amoxicillin-pot clavulanate) .[name].. 1 every 12 hrs with a meal  Patient Instructions: 1)  Your symptoms are consistent with a virus and should improve w/ time 2)  More concerning than your virus is the fact that your heart is beating irregularly- you need to go to Cardiology at 2:00pm for an appt w/ Dr Excell Seltzer 3)  Sherri Rad in there!  Appended Document: POSSIBLE SINUS PROBLEMS//KN     History of Present Illness: 60 yo man here today for sinus congestion and pressure.  sxs started Friday evening w/ nasal congestion.  no fevers.  + cough this AM but none since, productive of brown sputum early in AM but then white.  no ear pain.  no facial pain or pressure.  no known sick contacts.  feeling improved today.  just completed abx course (augmentin) on Sunday.  Preventive Screening-Counseling & Management  Alcohol-Tobacco     Smoking Status: current     Smoking Cessation Counseling: yes  Current Medications (verified): 1)  Cilostazol 100 Mg  Tabs (Cilostazol) .[name].. 1 By Mouth Two Times A Day 2)  Fortamet 1000 Mg  Tb24 (Metformin Hcl) .[name].. 1 By Mouth Once Daily 3)  Advair Diskus 100-50 Mcg/dose  Misc (Fluticasone-Salmeterol) .... Bid 4)  Combivent 103-18 Mcg/act  Aero (Ipratropium-Albuterol) .[name].. 1-2 Sprays Q 4 Hours As Needed 5)  Gnp Flax Seed Oil 1000 Mg  Caps (Flaxseed (Linseed)) .... Daily 6)  Co Q-10 Vitamin E Fish Oil 60-90-25-200  Caps (Dha-Epa-Coenzyme Q10-Vitamin E) .... Take Daily 7)  Aspirin Ec 325 Mg Tbec (Aspirin) .... Take One Tablet By Mouth Daily 8)  Onetouch Ultra Test  Strp (Glucose Blood) .... Pt Test 4 Times A Day 9)  Crestor 20 Mg Tabs (Rosuvastatin Calcium) .[name].. 1 By Mouth Once Daily 10)   Onetouch Ultrasoft Lancets  Misc (Lancets) .... Test Twice Daily 11)  Ketoconazole 2 % Crea (Ketoconazole) .... Apply 2 Times Daily and Blow Dry 12)  Bactroban 2 % Crea (Mupirocin Calcium) .... Apply Two Times A Day After Cleansing 13)  Amoxicillin-Pot Clavulanate 500-125 Mg Tabs (Amoxicillin-Pot Clavulanate) .[name].. 1 Every 12 Hrs With A Meal  Allergies (verified): 1)  ! 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  He drinks at least three beers per day.  He smokes one pack of cigarettes per day.   He  is  working as a Water quality scientist      See HPI  Physical Exam  General:  in no acute distress; alert,appropriate and cooperative throughout examination; strong tobacco odor present Head:  Normocephalic and atraumatic without obvious abnormalities. No apparent alopecia or balding.  no TTP over sinuses Eyes:  no injxn or inflammation Ears:  TMs WNL Nose:  External nasal examination shows no deformity or inflammation. Nasal mucosa are pink and moist without lesions or exudates. Septum to R Mouth:  Oral mucosa and oropharynx without lesions or exudates.  + PND Lungs:  Normal respiratory effort, chest expands symmetrically. Lungs are clear to auscultation, no crackles or wheezes. Heart:  irregularly irregular   Impression & Recommendations:  Problem # 1:  URI (ICD-465.9) pt's  sxs appear to be viral- no evidence of bacterial infxn on exam.  just completed abx course.  reviewed supportive care and red flags that should prompt return.  Pt expresses understanding and is in agreement w/ this plan. His updated medication list for this problem includes:    Aspirin Ec 325 Mg Tbec (Aspirin) .[name]... Take one tablet by mouth daily  Problem # 2:  ATRIAL FIBRILLATION (ICD-427.31) Assessment: Comment Only  EKG shows new onset Afib w/ RVR.  pt asymptomatic- denies CP, SOB, edema.  has seen Dr Excell Seltzer in the past.  Called cards and got pt an appt for 2pm, EKG faxed. His updated medication list for this problem includes:    Cilostazol 100 Mg Tabs (Cilostazol) .[name]... 1 by mouth two times a day    Aspirin Ec 325 Mg Tbec (Aspirin) .[name]... Take one tablet by mouth daily  Orders: EKG w/ Interpretation (93000)  Complete Medication List: 1)  Cilostazol 100 Mg Tabs (Cilostazol) .[name].. 1 by mouth two times a day 2)  Fortamet 1000 Mg Tb24 (Metformin hcl) .[name].. 1 by mouth once daily 3)  Advair Diskus 100-50 Mcg/dose Misc (Fluticasone-salmeterol) .... Bid 4)  Combivent 103-18 Mcg/act Aero (Ipratropium-albuterol) .[name].. 1-2 sprays q 4 hours as needed 5)  Gnp Flax Seed Oil 1000 Mg Caps (Flaxseed (linseed)) .... Daily 6)  Co Q-10 Vitamin E Fish Oil 60-90-25-200 Caps (Dha-epa-coenzyme q10-vitamin e) .... Take daily 7)  Aspirin Ec 325 Mg Tbec (Aspirin) .... Take one tablet by mouth daily 8)  Onetouch Ultra Test Strp (Glucose blood) .... Pt test 4 times a day 9)  Crestor 20 Mg Tabs (Rosuvastatin calcium) .[name].. 1 by mouth once daily 10)  Onetouch Ultrasoft Lancets Misc (Lancets) .... Test twice daily 11)  Ketoconazole 2 % Crea (Ketoconazole) .... Apply 2 times daily and blow dry 12)  Bactroban 2 % Crea (Mupirocin calcium) .... Apply two times a day after cleansing 13)  Amoxicillin-pot Clavulanate 500-125 Mg Tabs (Amoxicillin-pot clavulanate) .[name].. 1 every 12 hrs with a meal

## 2010-10-23 NOTE — Assessment & Plan Note (Signed)
Summary: f1y  Medications Added ASPIRIN EC 325 MG TBEC (ASPIRIN) Take one tablet by mouth daily      Allergies Added:   Visit Type:  Follow-up Primary Provider:  Marga Melnick MD   History of Present Illness: Neil Carroll is a 60 year old gentleman with bilateral lower extremity claudication.  He has total superficial femoral artery occlusions.  He is ABIs back in 2004 were 0.4 on the right and 0.5 on the left.  He has been managed conservatively with cilostazol and an exercise program.  He has stable bilateral calf aching at 150 feet of walking.  He has no symptoms at shorter distance.  He denies rest pain or ischemic ulcerations.   Pt has mild bilateral calf claudication, unchanged over several years. No rest pain or ischemic ulceration.   Pt down from 2ppd to 1ppd cigarettes, but unable to quit.  Current Medications (verified): 1)  Amlodipine Besylate 5 Mg  Tabs (Amlodipine Besylate) .[name].. 1 By Mouth Qd 2)  Cilostazol 100 Mg  Tabs (Cilostazol) .[name].. 1 By Mouth Two Times A Day 3)  Fortamet 1000 Mg  Tb24 (Metformin Hcl) .[name].. 1 By Mouth Once Daily 4)  Advair Diskus 100-50 Mcg/dose  Misc (Fluticasone-Salmeterol) .... Bid 5)  Combivent 103-18 Mcg/act  Aero (Ipratropium-Albuterol) .[name].. 1-2 Sprays Q 4 Hours As Needed 6)  Gnp Flax Seed Oil 1000 Mg  Caps (Flaxseed (Linseed)) .... Daily 7)  Co Q-10 Vitamin E Fish Oil 60-90-25-200  Caps (Dha-Epa-Coenzyme Q10-Vitamin E) .... Take Daily 8)  Aspirin Ec 325 Mg Tbec (Aspirin) .... Take One Tablet By Mouth Daily 9)  Onetouch Ultra Test  Strp (Glucose Blood) .... Pt Test 4 Times A Day 10)  Crestor 20 Mg Tabs (Rosuvastatin Calcium) .[name].. 1qd- Labs Due For Additional Refills 11)  Onetouch Ultrasoft Lancets  Misc (Lancets) .... Test Twice Daily  Allergies (verified): 1)  ! * Niaspan  Past History:  Past medical history reviewed for relevance to current acute and chronic problems.  Past Medical History: Reviewed history from 01/02/2010 and no  changes required. 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 Negative except as per HPI.  Vital Signs:  Patient profile:   60 year old male Height:      71 inches Weight:      251 pounds BMI:     35.13 Pulse rate:   92 / minute Resp:     14 per minute BP sitting:   132 / 64  (left arm)  Vitals Entered By: Laurance Flatten CMA (January 07, 2010 11:28 AM)  Physical Exam  General:  Pt is alert and oriented, in no acute distress. HEENT: normal Neck: normal carotid upstrokes without bruits, JVP normal Lungs: CTA CV: RRR without murmur or gallop Abd: soft, NT, positive BS, no bruit, no organomegaly Ext: no clubbing, cyanosis, or edema. pedal pulses not palpable, femoral pulses 2+ Skin: warm and dry - extensive posriatic patches on arms    EKG  Procedure date:  01/07/2010  Findings:      NSR, HR 92 bpm, within normal limits.  Impression & Recommendations:  Problem # 1:  PVD  WITH CLAUDICATION (ICD-443.89) Pt with stable intermittent claudication. Will reassess with ABI's. Continue medical management in setting of long SFA occlusions and stable symptoms. Tolerating cilostazol/ASA/Crestor. Reviewed once again the importance of tobacco cessation as it relates to his vascular disease.  Problem # 2:  HYPERLIPIDEMIA (ICD-272.4) Lipids at goal with LDL less than 70 mg/dL.  His updated medication list for this problem includes:     Crestor 20 Mg Tabs (Rosuvastatin calcium) .[name]... 1 by mouth once daily  CHOL: 105 (12/31/2009)   LDL: 54 (12/31/2009)   HDL: 36.50 (12/31/2009)   TG: 75.0 (12/31/2009) CHOL (goal): 200 (06/10/2007)   LDL (goal): 70 (05/21/2008)   HDL (goal): 40 (06/10/2007)   TG (goal): 150 (06/10/2007)  Other Orders: EKG w/ Interpretation (93000) Arterial Duplex Lower Extremity (Arterial Duplex Low)  Patient Instructions: 1)  Your physician wants you to follow-up in:  1 YEAR.  You will receive a reminder letter in the mail two months in advance. If you don't receive a letter, please call our office to schedule the follow-up appointment. 2)  Your physician has requested that you have an ankle brachial index (ABI). During this test an ultrasound and blood pressure cuff are used to evaluate the arteries that supply the arms and legs with blood. Allow thirty minutes for this exam. There are no restrictions or special instructions. 3)  Your physician recommends that you continue on your current medications as directed. Please refer to the Current Medication list given to you today.

## 2010-10-23 NOTE — Assessment & Plan Note (Signed)
Summary: cold and cough, no fever///sph   Vital Signs:  Patient profile:   60 year old male Weight:      254.2 pounds BMI:     35.58 Temp:     98.5 degrees F oral Pulse rate:   60 / minute Resp:     17 per minute BP sitting:   140 / 80  (left arm) Cuff size:   large  Vitals Entered By: Shonna Chock CMA (August 08, 2010 10:35 AM) CC: Cold and cough x 3-4 days , URI symptoms   Primary Care [place]:  Marga Melnick MD  CC:  Cold and cough x 3-4 days  and URI symptoms.  History of Present Illness: RTI  Symptoms      This is a 60 year old man who presents with RTI  symptoms since 11/14; onset  as dry  cough & then rhinitis.  The patient  now reports productive cough with white to brown sputum, but denies nasal congestion, purulent nasal discharge, sore throat, and earache.  Associated symptoms include dyspnea and wheezing.  The patient denies fever.  The patient denies headache.  The patient denies the following risk factors for Strep sinusitis: unilateral facial pain, tooth pain, and tender adenopathy. Rx: Tylenol. FBS < 130. Smoking 1 ppd (pathophysilogy of cardiopulmonary  risks discussed).  His cardiology evaluation ( S/P ablation for AF) & Neuro studies were reviewed with him @ his request.Copy of MRI was given to him.Significance of "atrophy & ischemic small vessel disease" was explained.  Current Medications (verified): 1)  Cilostazol 100 Mg  Tabs (Cilostazol) .[name].. 1 By Mouth Two Times A Day 2)  Fortamet 1000 Mg  Tb24 (Metformin Hcl) .[name].. 1 By Mouth Once Daily 3)  Advair Diskus 100-50 Mcg/dose  Misc (Fluticasone-Salmeterol) .... Bid 4)  Gnp Flax Seed Oil 1000 Mg  Caps (Flaxseed (Linseed)) .... Daily 5)  Co Q-10 Vitamin E Fish Oil 60-90-25-200  Caps (Dha-Epa-Coenzyme Q10-Vitamin E) .... Take Daily 6)  Aspirin Ec 325 Mg Tbec (Aspirin) .... Take One Tablet By Mouth Daily 7)  Onetouch Ultra Test  Strp (Glucose Blood) .... Pt Test 4 Times A Day 8)  Crestor 20 Mg Tabs (Rosuvastatin  Calcium) .[name].. 1 By Mouth Once Daily**labs Due** 9)  Onetouch Ultrasoft Lancets  Misc (Lancets) .... Test Twice Daily 10)  Ketoconazole 2 % Crea (Ketoconazole) .... Apply 2 Times Daily and Blow Dry 11)  Bactroban 2 % Crea (Mupirocin Calcium) .... Apply Two Times A Day After Cleansing 12)  Vitamin E 400 Unit Caps (Vitamin E) .... Take 1 Capsule By Mouth Once A Day 13)  Metoprolol Tartrate 50 Mg Tabs (Metoprolol Tartrate) .... Take One Tablet By Mouth Twice A Day 14)  Soriatane 10 Mg Caps (Acitretin) .... Once A Day  Allergies: 1)  ! * Niaspan  Physical Exam  General:  well-nourished,in no acute distress; alert,appropriate and cooperative throughout examination Ears:  External ear exam shows no significant lesions or deformities.  Otoscopic examination reveals clear canals, tympanic membranes are intact bilaterally without bulging, retraction, inflammation or discharge. Hearing is grossly normal bilaterally. Nose:  External nasal examination shows no deformity or inflammation. Nasal mucosa are erythematous without lesions or exudates.Septum to R. Hyponasal speech Mouth:  Oral mucosa and oropharynx without lesions or exudates.  Teeth in good repair. Hoarse Lungs:  Normal respiratory effort, chest expands symmetrically. Lungs: mild bibasialar  crackles  w/o  wheezes. Heart:  irregular rhythm due to occasional brief  pauses.   Cervical Nodes:  No lymphadenopathy noted Axillary Nodes:  No palpable lymphadenopathy   Impression & Recommendations:  Problem # 1:  BRONCHITIS-ACUTE (ICD-466.0)  His updated medication list for this problem includes:    Advair Diskus 100-50 Mcg/dose Misc (Fluticasone-salmeterol) ..... Bid    Azithromycin 250 Mg Tabs (Azithromycin) .[name]... As per pack  Problem # 2:  ATRIAL FLUTTER (ICD-427.32) S/P ablation The following medications were removed from the medication list:    Coumadin 5 Mg Tabs (Warfarin sodium) .[name]... Take as directed His updated medication list for  this problem includes:    Cilostazol 100 Mg Tabs (Cilostazol) .[name]... 1 by mouth two times a day    Aspirin Ec 325 Mg Tbec (Aspirin) .[name]... Take one tablet by mouth daily    Metoprolol Tartrate 50 Mg Tabs (Metoprolol tartrate) .[name]... Take one tablet by mouth twice a day  Problem # 3:  DIABETES MELLITUS, TYPE II (ICD-250.00)  His updated medication list for this problem includes:    Fortamet 1000 Mg Tb24 (Metformin hcl) .[name]... 1 by mouth once daily    Aspirin Ec 325 Mg Tbec (Aspirin) .[name]... Take one tablet by mouth daily  Orders: Venipuncture (14782) TLB-Lipid Panel (80061-LIPID) TLB-A1C / Hgb A1C (Glycohemoglobin) (83036-A1C) Specimen Handling (95621)  Complete Medication List: 1)  Cilostazol 100 Mg Tabs (Cilostazol) .[name].. 1 by mouth two times a day 2)  Fortamet 1000 Mg Tb24 (Metformin hcl) .[name].. 1 by mouth once daily 3)  Advair Diskus 100-50 Mcg/dose Misc (Fluticasone-salmeterol) .... Bid 4)  Gnp Flax Seed Oil 1000 Mg Caps (Flaxseed (linseed)) .... Daily 5)  Co Q-10 Vitamin E Fish Oil 60-90-25-200 Caps (Dha-epa-coenzyme q10-vitamin e) .... Take daily 6)  Aspirin Ec 325 Mg Tbec (Aspirin) .... Take one tablet by mouth daily 7)  Onetouch Ultra Test Strp (Glucose blood) .... Pt test 4 times a day 8)  Crestor 20 Mg Tabs (Rosuvastatin calcium) .[name].. 1 by mouth once daily**labs due** 9)  Onetouch Ultrasoft Lancets Misc (Lancets) .... Test twice daily 10)  Ketoconazole 2 % Crea (Ketoconazole) .... Apply 2 times daily and blow dry 11)  Bactroban 2 % Crea (Mupirocin calcium) .... Apply two times a day after cleansing 12)  Vitamin E 400 Unit Caps (Vitamin e) .... Take 1 capsule by mouth once a day 13)  Metoprolol Tartrate 50 Mg Tabs (Metoprolol tartrate) .... Take one tablet by mouth twice a day 14)  Soriatane 10 Mg Caps (Acitretin) .... Once a day 15)  Azithromycin 250 Mg Tabs (Azithromycin) .... As per pack  Other Orders: Admin 1st Vaccine (30865) Flu Vaccine 26yrs + (78469)  Patient  Instructions: 1)  Check your blood sugars regularly. If your readings are usually above : 150 or below 90 you should contact our office. 2)  See your eye doctor yearly to check for diabetic eye damage. 3)  Check your feet each night for sore areas, calluses or signs of infection. 4)  Check your Blood Pressure regularly. If it is above: 135/85 ON AVERAGE  you should make an appointment. 5)  Stop Smoking Tips: Choose a Quit date. Cut down before the Quit date. decide what you will do as a substitute when you feel the urge to smoke(gum,toothpick,exercise). Prescriptions: AZITHROMYCIN 250 MG TABS (AZITHROMYCIN) as per pack  #1 x 0   Entered and Authorized by:   Marga Melnick MD   Signed by:   Marga Melnick MD on 08/08/2010   Method used:   Faxed to ...       [facility] (  retail)       [address]       [place], [place]  [id number]       Ph: [phone]       Fax: [phone]   RxID:   [id number]    Orders Added: 1)  Est. Patient Level IV [24401] 2)  Venipuncture [02725] 3)  TLB-Lipid Panel [80061-LIPID] 4)  TLB-A1C / Hgb A1C (Glycohemoglobin) [83036-A1C] 5)  Admin 1st Vaccine [90471] 6)  Flu Vaccine 19yrs + [36644] 7)  Specimen Handling [99000] Flu Vaccine Consent Questions     Do you have a history of severe allergic reactions to this vaccine? no    Any prior history of allergic reactions to egg and/or gelatin? no    Do you have a sensitivity to the preservative Thimersol? no    Do you have a past history of Guillan-Barre Syndrome? no    Do you currently have an acute febrile illness? no    Have you ever had a severe reaction to latex? no    Vaccine information given and explained to patient? yes    Are you currently pregnant? no    Lot Number:[id number]   Exp Date:03/21/2011   Site Given  Left Deltoid IMnipuncture [36415] 3)  TLB-Lipid Panel [80061-LIPID] 4)  TLB-A1C / Hgb A1C (Glycohemoglobin) [83036-A1C] 5)  Admin 1st Vaccine [90471] 6)  Flu  Vaccine 2yrs + [03474]   .lbflu

## 2010-10-23 NOTE — Letter (Signed)
Summary: ELectrophysiology/Ablation Procedure Instructions  [facility], Main Office  [address]   [place], [place] [address]   Phone: [phone]  Fax: [phone]     Electrophysiology/Ablation Procedure Instructions    You are scheduled for a(n) TEE/FLUTTER ABLATION on [facility], April 02, 2010 at 2PM (TEE AT 10AM) with Drs Graciela Husbands and Shirlee Latch.  1.  Please come to the Short Stay Center at [facility] at 9 AM on the day of your procedure.  2.  Come prepared to stay overnight.   Please bring your insurance cards and a list of your medications.  3.  Come to the [place] office on March 31, 2010 for lab work.  The lab at [facility] is open from 8:30 AM to 1:30 PM and 2:30 PM to 5:00 PM.  The lab at [facility] is open from 7:30 AM to 5:30 PM.  You do not have to be fasting.  4.  Do not have anything to eat or drink after midnight the night before your procedure.      * Occasionally, EP studies and ablations can become lengthy.  Please make your family aware of this before your procedure starts.  Average time ranges from 2-8 hours for EP studies/ablations.  Your physician will locate your family after the procedure with the results.  * If you have any questions after you get home, please call the office at [phone].

## 2010-10-23 NOTE — Letter (Signed)
Summary: [facility]  [facility]   Imported By: Lanelle Bal 01/13/2010 11:09:27  _____________________________________________________________________  External Attachment:    Type:   Image     Comment:   External Document

## 2010-10-23 NOTE — Medication Information (Signed)
Summary: rov/sp  Anticoagulant Therapy  Managed by: Bethena Midget, RN, BSN Referring MD: Excell Seltzer PCP: Marga Melnick MD Supervising MD: Graciela Husbands MD, Viviann Spare Indication 1: Atrial Flutter Lab Used: [facility] [place] Site: [address] INR POC 1.8 INR RANGE 2.0-3.0  Dietary changes: no    Health status changes: no    Bleeding/hemorrhagic complications: no    Recent/future hospitalizations: no    Any changes in medication regimen? no    Recent/future dental: no  Any missed doses?: no       Is patient compliant with meds? yes       Allergies: 1)  ! * Niaspan  Anticoagulation Management History:      The patient is taking warfarin and comes in today for a routine follow up visit.  Positive risk factors for bleeding include presence of serious comorbidities.  Negative risk factors for bleeding include an age less than 32 years old.  The bleeding index is 'intermediate risk'.  Positive CHADS2 values include History of CHF, History of HTN, and History of Diabetes.  Negative CHADS2 values include Age > 63 years old.  His last INR was 1.346 ratio.  Anticoagulation responsible provider: Graciela Husbands MD, Viviann Spare.  INR POC: 1.8.  Cuvette Lot#: [id number].  Exp: 06/2011.    Anticoagulation Management Assessment/Plan:      The patient's current anticoagulation dose is Coumadin 5 mg tabs: take as directed.  The target INR is 2.0-3.0.  The next INR is due 06/05/2010.  Anticoagulation instructions were given to patient.  Results were reviewed/authorized by Bethena Midget, RN, BSN.  He was notified by Bethena Midget, RN, BSN.         Prior Anticoagulation Instructions: INR 1.7  Today take 2 tablets, then begin 1.5 tablets daily except 1 tablet Tue and Sat.  Return to clinic in 13 days.      Current Anticoagulation Instructions: INR 1.8 Today take 2 pills then change dose to 1.5 pills everyday except 1 pill on Tuesdays. Recheck in 2 weeks.

## 2010-10-23 NOTE — Assessment & Plan Note (Signed)
Summary: er fu/kdc   Vital Signs:  Patient profile:   60 year old male Weight:      255.4 pounds BMI:     35.75 Temp:     98.7 degrees F oral Pulse rate:   84 / minute Resp:     16 per minute BP sitting:   122 / 80  (left arm) Cuff size:   large  Vitals Entered By: Shonna Chock (December 30, 2009 2:07 PM) CC: 1.)  Emergency Room Follow-up:Symptoms still ongoing  2) Refill all meds and note to be out of work Comments REVIEWED MED LIST, PATIENT AGREED DOSE AND INSTRUCTION CORRECT    CC:  1.)  Emergency Room Follow-up:Symptoms still ongoing  2) Refill all meds and note to be out of work.  History of Present Illness: Still slightly unbalanced but 40-50 % improved since ER visit 12/26/2009. ER record reviewed:no significant  postural hypotension documented;glucose 167.No imaging done. He found himself in a [employer] parking lot @ 1:30 pm Thurs 12/26/2009 ; he believes he had amnesia for "1/2 minute".He was lightheaded 45 minutes later; this recurred over 3.5 hours. FBS average 120-130;  2 hr post meal glucoses ? 100. No hypoglycemia. Last A1c was 6.6% in 07/2009.Smoking 1 ppd; on ASA  81 mg once daily .  Preventive Screening-Counseling & Management  Alcohol-Tobacco     Smoking Cessation Counseling: yes  Allergies: 1)  ! * Niaspan  Past History:  Past Medical History: femoral bruit murmur hematuria, PMH of , Dr Annabell Howells Hyperlipidemia Hypertension Diabetes mellitus, type II  Past Surgical History: partial hip replacement left 1996 circumcision warts on buttocks removed 01/2000 R partial  hip replacement 10/2004-colon-polyp ; Cystoscopy 3/09 ,Dr Annabell Howells  Review of Systems General:  Complains of fatigue; denies weight loss. Eyes:  Denies blurring, double vision, and vision loss-both eyes; Last exam 2010; no retinopathy. CV:  Denies chest pain or discomfort and palpitations. Derm:  Denies poor wound healing. Neuro:  Complains of headaches, poor balance, and sensation of room  spinning; denies brief paralysis, difficulty with concentration, disturbances in coordination, numbness, tingling, and weakness; Frontal dull- sharp to pounding headache 04/07 & 08;it resolved over 36 hrs.Minor vertigo for a few seconds 04/07.. Endo:  Denies excessive hunger, excessive thirst, and excessive urination.  Physical Exam  General:  in no acute distress; alert,appropriate and cooperative throughout examination Eyes:  No corneal or conjunctival inflammation noted. EOMI. Perrla. Field of Vision grossly normal. Slight exotropia OS with accommodation Mouth:  No tongue deviation Heart:  Normal rate and regular rhythm. S1 and S2 normal without gallop, murmur, click, rub.S4 with slurring Pulses:  R and L carotid,radial,dorsalis pedis and posterior tibial pulses are full and equal bilaterally Extremities:  trace left pedal edema and trace right pedal edema.  Chronic toenail changes Neurologic:  alert & oriented X3, cranial nerves II-XII intact, strength normal in all extremities, sensation intact to light touch, DTRs symmetrical and normal, finger-to-nose normal, and Romberg negative.  No drift with pronation  Skin:  Psoriatic patches Psych:  memory intact for recent and remote, normally interactive, and good eye contact.     Impression & Recommendations:  Problem # 1:  AMNESIA, TRANSIENT GLOBAL (ICD-437.7)  Orders: Radiology Referral (Radiology) Neurology Referral (Neuro) Tobacco use cessation intermediate 3-10 minutes (64332)  Problem # 2:  DIABETES MELLITUS, TYPE II (ICD-250.00)  His updated medication list for this problem includes:    Fortamet 1000 Mg Tb24 (Metformin hcl) .[name]... 1 by mouth once daily    Adult  Aspirin Ec Low Strength 81 Mg Tbec (Aspirin) .[name]... Daily  Orders: Radiology Referral (Radiology)  Problem # 3:  HYPERTENSION (ICD-401.9) controlled His updated medication list for this problem includes:    Amlodipine Besylate 5 Mg Tabs (Amlodipine besylate) .[name]... 1 by  mouth qd  Problem # 4:  HYPERLIPIDEMIA (ICD-272.4)  His updated medication list for this problem includes:    Crestor 20 Mg Tabs (Rosuvastatin calcium) .[name]... 1qd- labs due for additional refills  Problem # 5:  NONDEPENDENT TOBACCO USE DISORDER (ICD-305.1) Pathophysiology of smoking induced cardiovascular events & 2-3 X increased risk of such  events discussed Orders: Tobacco use cessation intermediate 3-10 minutes (99406)  Complete Medication List: 1)  Amlodipine Besylate 5 Mg Tabs (Amlodipine besylate) .[name].. 1 by mouth qd 2)  Cilostazol 100 Mg Tabs (Cilostazol) .[name].. 1 by mouth two times a day 3)  Fortamet 1000 Mg Tb24 (Metformin hcl) .[name].. 1 by mouth once daily 4)  Advair Diskus 100-50 Mcg/dose Misc (Fluticasone-salmeterol) .... Bid 5)  Combivent 103-18 Mcg/act Aero (Ipratropium-albuterol) .[name].. 1-2 sprays q 4 hours as needed 6)  Gnp Flax Seed Oil 1000 Mg Caps (Flaxseed (linseed)) .... Daily 7)  Co Q-10 Vitamin E Fish Oil 60-90-25-200 Caps (Dha-epa-coenzyme q10-vitamin e) .... Take daily 8)  Adult Aspirin Ec Low Strength 81 Mg Tbec (Aspirin) .... Daily 9)  Budeprion Sr 150 Mg Xr12h-tab (Bupropion hcl) .[name].. 1 by mouth once daily 10)  Onetouch Ultra Test Strp (Glucose blood) .... Pt test 4 times a day 11)  Crestor 20 Mg Tabs (Rosuvastatin calcium) .[name].. 1qd- labs due for additional refills 12)  Onetouch Ultrasoft Lancets Misc (Lancets) .... Test twice daily  Patient Instructions: 1)  Fasting labs in am: 2)  Hepatic Panel; 3)  Lipid Panel ; 4)  TSH ; 5)  HbgA1C; 6)  Urine Microalbumin . Hold Fortamet until 2 days after CT scan completed . 7)  Stop Smoking Tips: Choose a Quit date. Cut down before the Quit date. decide what you will do as a substitute when you feel the urge to smoke(gum,toothpick,exercise). 8)  Recommended remaining out of work for UNLESS you are not driving. 9)  Tobacco is very bad for your health as we discussed.You Should stop smoking!. 10)  Take an Aspirin 325 mg   every day. Prescriptions: ADVAIR DISKUS 100-50 MCG/DOSE  MISC (FLUTICASONE-SALMETEROL) bid  #60 Each x 11   Entered and Authorized by:   Marga Melnick MD   Signed by:   Marga Melnick MD on 12/30/2009   Method used:   Print then Give to Patient   RxID:   [id number] COMBIVENT 103-18 MCG/ACT  AERO (IPRATROPIUM-ALBUTEROL) 1-2 sprays q 4 hours as needed  #14.7 x 11   Entered and Authorized by:   Marga Melnick MD   Signed by:   Marga Melnick MD on 12/30/2009   Method used:   Print then Give to Patient   RxID:   [id number] FORTAMET 1000 MG  TB24 (METFORMIN HCL) 1 by mouth once daily  #90 x 1   Entered and Authorized by:   Marga Melnick MD   Signed by:   Marga Melnick MD on 12/30/2009   Method used:   Print then Give to Patient   RxID:   [id number] AMLODIPINE BESYLATE 5 MG  TABS (AMLODIPINE BESYLATE) 1 by mouth qd  #90 x 3   Entered and Authorized by:   Marga Melnick MD   Signed by:   Marga Melnick MD on 12/30/2009   Method used:  Print then Give to Patient   RxID:   [id number]

## 2010-10-23 NOTE — Consult Note (Signed)
Summary: [facility]   Imported By: Lanelle Bal 03/31/2010 09:16:10  _____________________________________________________________________  External Attachment:    Type:   Image     Comment:   External Document

## 2010-10-23 NOTE — Medication Information (Signed)
Summary: rov/ln  Anticoagulant Therapy  Managed by: Bethena Midget, RN, BSN Referring MD: Excell Seltzer PCP: Marga Melnick MD Supervising MD: Eden Emms MD, Theron Arista Indication 1: Atrial Flutter Lab Used: [facility] [place] Site: [address] INR POC 2.4 INR RANGE 2.0-3.0  Dietary changes: no    Health status changes: no    Bleeding/hemorrhagic complications: no    Recent/future hospitalizations: no    Any changes in medication regimen? no    Recent/future dental: no  Any missed doses?: no       Is patient compliant with meds? yes       Allergies: 1)  ! * Niaspan  Anticoagulation Management History:      The patient is taking warfarin and comes in today for a routine follow up visit.  Positive risk factors for bleeding include presence of serious comorbidities.  Negative risk factors for bleeding include an age less than 64 years old.  The bleeding index is 'intermediate risk'.  Positive CHADS2 values include History of CHF, History of HTN, and History of Diabetes.  Negative CHADS2 values include Age > 68 years old.  His last INR was 1.346 ratio.  Anticoagulation responsible provider: Eden Emms MD, Theron Arista.  INR POC: 2.4.  Cuvette Lot#: [id number].  Exp: 05/2011.    Anticoagulation Management Assessment/Plan:      The patient's current anticoagulation dose is Coumadin 5 mg tabs: take as directed.  The target INR is 2.0-3.0.  The next INR is due 04/30/2010.  Anticoagulation instructions were given to patient.  Results were reviewed/authorized by Bethena Midget, RN, BSN.  He was notified by Bethena Midget, RN, BSN.         Prior Anticoagulation Instructions: INR 1.5  Take 2 tabs today.  Then change to 1.5 on Monday, Wednesday, and Friday and 1 tab all other days.  Current Anticoagulation Instructions: INR 2.4 Continue 1 pill everyday except 1.5 pills on Mondays, Wednesdays and Fridays. Recheck in 3 weeks per Pharm D.

## 2010-10-23 NOTE — Assessment & Plan Note (Signed)
Summary: atrial flutter/lwb      Allergies Added:   Primary Provider:  Marga Melnick MD  CC:  atrial flutter. Pt states he is feeling fine today.  History of Present Illness: Neil Carroll is seen at the request of Dr. Excell Carroll because of the identification of atrial flutter.  He was seen for peripheral vascular disease and was found to be in atrial flutter. Other thromboembolic  risk factors include hypertension and diabetes  the patient has had some symptoms of palpitations. He had problems with peripheral edema but these have improved since the discontinuation of his amlodipine. He did not notice significant problems with exercise intolerance.  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 He found himself in the wrong parking lot. There is no loss of postural tone. This was thought by Neil Carroll to be transient global amnesia. He had no syncope.       Current Medications (verified): 1)  Cilostazol 100 Mg  Tabs (Cilostazol) .[name].. 1 By Mouth Two Times A Day 2)  Fortamet 1000 Mg  Tb24 (Metformin Hcl) .[name].. 1 By Mouth Once Daily 3)  Advair Diskus 100-50 Mcg/dose  Misc (Fluticasone-Salmeterol) .... Bid 4)  Gnp Flax Seed Oil 1000 Mg  Caps (Flaxseed (Linseed)) .... Daily 5)  Co Q-10 Vitamin E Fish Oil 60-90-25-200  Caps (Dha-Epa-Coenzyme Q10-Vitamin E) .... Take Daily 6)  Aspirin Ec 325 Mg Tbec (Aspirin) .... Take One Tablet By Mouth Daily 7)  Onetouch Ultra Test  Strp (Glucose Blood) .... Pt Test 4 Times A Day 8)  Crestor 20 Mg Tabs (Rosuvastatin Calcium) .[name].. 1 By Mouth Once Daily 9)  Onetouch Ultrasoft Lancets  Misc (Lancets) .... Test Twice Daily 10)  Ketoconazole 2 % Crea (Ketoconazole) .... Apply 2 Times Daily and Blow Dry 11)  Bactroban 2 % Crea (Mupirocin Calcium) .... Apply Two Times A Day After Cleansing 12)  Vitamin E 400 Unit Caps (Vitamin E) .... Take 1 Capsule By Mouth Once A Day 13)  Cardizem Cd 240 Mg Xr24h-Cap (Diltiazem Hcl  Coated Beads) .... Take One Capsule By Mouth Daily 14)  Coumadin 5 Mg Tabs (Warfarin Sodium) .... Take As Directed  Allergies (verified): 1)  ! 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  Social History: Last updated: 01/02/2010  The patient is married.  He drinks at least three beers per day.  He smokes one pack of cigarettes per day.   He is  working as a Estate agent Signs:  Patient profile:   60 year old male Height:      71 inches Weight:      242 pounds Pulse rate:   135 / minute BP sitting:   136 / 94  (left arm) Cuff size:   large  Vitals Entered By: Judithe Modest CMA (March 25, 2010  3:46 PM)  Physical Exam  General:  Alert and oriented note the older African American male appearing his stated agein no acute distress. HEENT  normal . Neck veins were 787; carotids brisk and full without bruits. No lymphadenopathy. Back without kyphosis. Lungs clear. Heart sounds regular and rapid PMI nondisplaced. Abdomen soft with active bowel sounds without midline pulsation or hepatomegaly. Femoral pulses and distal pulses  intact. Extremities were without clubbing cyanosis or edemaSkin warm and dry with multiple psoriatic lesions spread diffusely. Neurological exam grossly normal; affect is engaged    Impression & Recommendations:  Problem # 1:  ATRIAL FLUTTER (ICD-427.32) The patient has atrial flutter with a subtherapeutic INR.  I am concerned about the development of tachycardia-induced cardiomyopathy given our inability to control the heart rate and it is unknown duration. we'll plan to undertake a TE guided flutter ablation as hopefully this will obviate the need for long-term oral anticoagulation.We have reviewed the potential benefits as well as potential risks including but not limited to death and pacemaker implantation he understands these risks and is willing to proceed His updated medication list for this problem includes:    Cilostazol 100 Mg Tabs (Cilostazol) .[name]... 1 by mouth two times a day    Aspirin Ec 325 Mg Tbec (Aspirin) .[name]... Take one tablet by mouth daily    Coumadin 5 Mg Tabs (Warfarin sodium) .[name]... Take as directed  Orders: EKG w/ Interpretation (93000)  Problem # 2:  PSORIASIS (ICD-696.1) the patient has no evidence of infected psoriasis at this point I think the risks for vascular access should be low  Problem # 3:  CHF (ICD-428.0) the patient has congestive failure manifested by edema and probably some degree of exercise intolerance. I think the most expeditious therapy for this would be to get his rate control which we will do by catheter ablation. He is not so symptomatic at this point that pushing his medications I think is indicated and the ability to control the flutter rate would be difficult in any case. By examination he does not have significant cardiomegaly; his symptoms are mild for dyspnea on exertion. Will plan to undertake an echo for LV function assessment. His updated medication list for this problem includes:    Cilostazol 100 Mg Tabs (Cilostazol) .[name]... 1 by mouth two  times a day    Aspirin Ec 325 Mg Tbec (Aspirin) .[name]... Take one tablet by mouth daily    Cardizem Cd 240 Mg Xr24h-cap (Diltiazem hcl coated beads) .[name]... Take one capsule by mouth daily    Coumadin 5 Mg Tabs (Warfarin sodium) .[name]... Take as directed

## 2010-10-23 NOTE — Medication Information (Signed)
Summary: rov/jaj  Anticoagulant Therapy  Managed by: Cloyde Reams, RN, BSN Referring MD: Excell Seltzer PCP: Marga Melnick MD Supervising MD: Myrtis Ser MD, Tinnie Gens Indication 1: Atrial Flutter Lab Used: [facility] [place] Site: [address] INR POC 2.0 INR RANGE 2.0-3.0  Dietary changes: no    Health status changes: no    Bleeding/hemorrhagic complications: no    Recent/future hospitalizations: no    Any changes in medication regimen? no    Recent/future dental: no  Any missed doses?: no       Is patient compliant with meds? yes       Allergies: 1)  ! * Niaspan  Anticoagulation Management History:      The patient is taking warfarin and comes in today for a routine follow up visit.  Positive risk factors for bleeding include presence of serious comorbidities.  Negative risk factors for bleeding include an age less than 90 years old.  The bleeding index is 'intermediate risk'.  Positive CHADS2 values include History of CHF, History of HTN, and History of Diabetes.  Negative CHADS2 values include Age > 37 years old.  His last INR was 1.346 ratio.  Anticoagulation responsible provider: Myrtis Ser MD, Tinnie Gens.  INR POC: 2.0.  Cuvette Lot#: [id number].  Exp: 07/2011.    Anticoagulation Management Assessment/Plan:      The patient's current anticoagulation dose is Coumadin 5 mg tabs: take as directed.  The target INR is 2.0-3.0.  The next INR is due 07/14/2010.  Anticoagulation instructions were given to patient.  Results were reviewed/authorized by Cloyde Reams, RN, BSN.  He was notified by Cloyde Reams RN.         Prior Anticoagulation Instructions: INR 1.7  Take 2 tablets today. Then change dose to 1 1/2 tablets everyday. Re-check INR in 2 weeks.       Current Anticoagulation Instructions: INR 2.0  Start taking 1.5 tablets daily. Recheck in 3 weeks.

## 2010-10-23 NOTE — Progress Notes (Signed)
Summary: D/C Coumadin   ---- Converted from flag ---- ---- 06/23/2010 11:52 AM, Claris Gladden RN wrote: pt is stopping coumadin and taking ASA. pls update your records. Claris Gladden, RN, BSN ------------------------------  Phone Note Other Incoming   Summary of Call: Dr Graciela Husbands stopped pt's Coumadin and started pt on ASA at [facility] on 06/23/10. Initial call taken by: Cloyde Reams RN,  June 23, 2010 4:15 PM

## 2010-10-23 NOTE — Procedures (Signed)
Summary: Colonoscopy  Patient: Neil Carroll Note: All result statuses are Final unless otherwise noted.  Tests: (1) Colonoscopy (COL)   COL Colonoscopy           DONE     [facility]     [address]     [place], [place]  [address]           COLONOSCOPY PROCEDURE REPORT           PATIENT:  Neil, Carroll  MR#:  [id number]     BIRTHDATE:  [birth year], 58 yrs. old  GENDER:  male           ENDOSCOPIST:  Barbette Hair. Arlyce Dice, MD     Referred by:           PROCEDURE DATE:  11/01/2009     PROCEDURE:  Colonoscopy with snare polypectomy     ASA CLASS:  Class II     INDICATIONS:  history of pre-cancerous (adenomatous) colon polyps                 MEDICATIONS:   Fentanyl 50 mcg IV, Versed 7 mg IV           DESCRIPTION OF PROCEDURE:   After the risks benefits and     alternatives of the procedure were thoroughly explained, informed     consent was obtained.  Digital rectal exam was performed and     revealed no abnormalities.   The [facility] [id number] endoscope     was introduced through the anus and advanced to the cecum, which     was identified by the ileocecal valve, without limitations.  The     quality of the prep was Moviprep fair.  The instrument was then     slowly withdrawn as the colon was fully examined.     <<PROCEDUREIMAGES>>           FINDINGS:  A sessile polyp was found in the sigmoid colon. It was     1 cm in size. It was found 25 cm from the point of entry. Polyp     was snared, then cauterized with monopolar cautery. Retrieval was     successful (see image7). snare polyp  A sessile polyp was found in     the sigmoid colon. It was 6 mm in size. It was found 20 cm from     the point of entry. Polyp was snared without cautery. Retrieval     was successful (see image9). snare polyp  There were multiple     polyps identified and removed. Multiple 1-32mm hyperplastic     appearing polyps in rectum and sigmoid below 25cm (see image8). 2     polyps  removed via cold polypectomy snare  This was otherwise a     normal examination of the colon (see image1, image2, image3,     image5, and image10).   Retroflexed views in the rectum revealed     no abnormalities.    The scope was then withdrawn from the patient     and the procedure completed.           COMPLICATIONS:  None           ENDOSCOPIC IMPRESSION:     1) 1 cm sessile polyp in the sigmoid colon     2) 6 mm sessile polyp in the sigmoid colon     3) Polyps, multiple     4) Otherwise normal examination  RECOMMENDATIONS:     1) colonoscopy 5 years           REPEAT EXAM:  In 5 year(s) for Colonoscopy.           ______________________________     Barbette Hair. Arlyce Dice, MD           CC:  Pecola Lawless, MD           n.     Rosalie Doctor:   Barbette Hair. Kaplan at 11/01/2009 09:48 AM           Dulce Sellar, [id number]  Note: An exclamation mark (!) indicates a result that was not dispersed into the flowsheet. Document Creation Date: 11/01/2009 9:48 AM _______________________________________________________________________  (1) Order result status: Final Collection or observation date-time: 11/01/2009 09:39 Requested date-time:  Receipt date-time:  Reported date-time:  Referring Physician:   Ordering Physician: Melvia Heaps [phone]) Specimen Source:  Source: Launa Grill Order Number: [id number] Lab site:   Appended Document: Colonoscopy 5 yr recall     Procedures Next Due Date:    Colonoscopy: 10/2014

## 2010-10-23 NOTE — Medication Information (Signed)
Summary: rov/ewj  Anticoagulant Therapy  Managed by: Cloyde Reams, RN, BSN Referring MD: Excell Seltzer PCP: Marga Melnick MD Supervising MD: Shirlee Latch MD, Leathia Farnell Indication 1: Atrial Flutter Lab Used: [facility]  Site: [address] INR POC 1.5 INR RANGE 2.0-3.0  Dietary changes: yes       Details: one salad since last week  Health status changes: no    Bleeding/hemorrhagic complications: no    Recent/future hospitalizations: no    Any changes in medication regimen? no    Recent/future dental: no  Any missed doses?: no       Is patient compliant with meds? yes       Allergies: 1)  ! * Niaspan  Anticoagulation Management History:      The patient is taking warfarin and comes in today for a routine follow up visit.  Positive risk factors for bleeding include presence of serious comorbidities.  Negative risk factors for bleeding include an age less than 43 years old.  The bleeding index is 'intermediate risk'.  Positive CHADS2 values include History of CHF, History of HTN, and History of Diabetes.  Negative CHADS2 values include Age > 37 years old.  Anticoagulation responsible provider: Shirlee Latch MD, Kaylina Cahue.  INR POC: 1.5.  Cuvette Lot#: [id number].  Exp: 05/2011.    Anticoagulation Management Assessment/Plan:      The patient's current anticoagulation dose is Coumadin 5 mg tabs: take as directed.  The target INR is 2.0-3.0.  The next INR is due 04/07/2010.  Anticoagulation instructions were given to patient.  Results were reviewed/authorized by Cloyde Reams, RN, BSN.  He was notified by Dillard Cannon.         Prior Anticoagulation Instructions: INR 1.7  Take 1.5 tablets today. Start taking 1 tablet daily except 1.5 tablets on Mondays and Fridays.  Recheck in 1 week.   Current Anticoagulation Instructions: INR 1.5  Take 2 tabs today.  Then change to 1.5 on Monday, Wednesday, and Friday and 1 tab all other days.

## 2010-10-23 NOTE — Medication Information (Signed)
Summary: rov/tm  Anticoagulant Therapy  Managed by: Weston Brass, PharmD Referring MD: Excell Seltzer PCP: Marga Melnick MD Supervising MD: Gala Romney MD, Reuel Boom Indication 1: Atrial Flutter Lab Used: [facility] [place] Site: [address] INR POC 1.7 INR RANGE 2.0-3.0  Dietary changes: no    Health status changes: no    Bleeding/hemorrhagic complications: no    Recent/future hospitalizations: no    Any changes in medication regimen? no    Recent/future dental: no  Any missed doses?: no       Is patient compliant with meds? yes       Allergies: 1)  ! * Niaspan  Anticoagulation Management History:      The patient is taking warfarin and comes in today for a routine follow up visit.  Positive risk factors for bleeding include presence of serious comorbidities.  Negative risk factors for bleeding include an age less than 31 years old.  The bleeding index is 'intermediate risk'.  Positive CHADS2 values include History of CHF, History of HTN, and History of Diabetes.  Negative CHADS2 values include Age > 40 years old.  His last INR was 1.346 ratio.  Anticoagulation responsible provider: Bensimhon MD, Reuel Boom.  INR POC: 1.7.  Cuvette Lot#: [id number].  Exp: 07/2011.    Anticoagulation Management Assessment/Plan:      The patient's current anticoagulation dose is Coumadin 5 mg tabs: take as directed.  The target INR is 2.0-3.0.  The next INR is due 06/23/2010.  Anticoagulation instructions were given to patient.  Results were reviewed/authorized by Weston Brass, PharmD.  He was notified by Harrel Carina, PharmD candidate.         Prior Anticoagulation Instructions: INR 1.8 Today take 2 pills then change dose to 1.5 pills everyday except 1 pill on Tuesdays. Recheck in 2 weeks.   Current Anticoagulation Instructions: INR 1.7  Take 2 tablets today. Then change dose to 1 1/2 tablets everyday. Re-check INR in 2 weeks.

## 2010-10-23 NOTE — Letter (Signed)
Summary: Primary Care Appointment Letter  [facility]  [address] [place], [place] [address]   Phone: [phone]  Fax: [phone]    11/15/2009 MRN: [id number]  Neil Carroll [address] [place], [place]  [address]  Dear Mr. STRADLING,   Your Primary Care Physician Marga Melnick MD has indicated that:    _______it is time to schedule an appointment.    _______you missed your appointment on______ and need to call and          reschedule.    __X_____you need to have lab work done.    _______you need to schedule an appointment discuss lab or test results.    _______you need to call to reschedule your appointment that is                       scheduled on _________.     Please call our office as soon as possible. Our phone number is [phone]. Please press option 1. Our office is open 8a-12noon and 1p-5p, Monday through Friday.     Thank you,     Primary Care Scheduler

## 2010-10-23 NOTE — Medication Information (Signed)
Summary: NEW AFIB/5MG /SAF  Anticoagulant Therapy  Managed by: Weston Brass, PharmD Referring MD: Excell Seltzer PCP: Marga Melnick MD Supervising MD: Juanda Chance MD, Virgilia Quigg Indication 1: Atrial Flutter Lab Used: [facility] [place] Site: [address] INR POC 1.1  Dietary changes: no    Health status changes: no    Bleeding/hemorrhagic complications: no    Recent/future hospitalizations: no    Any changes in medication regimen? yes       Details: psoriasis med--? doxycycline  Recent/future dental: no  Any missed doses?: no       Is patient compliant with meds? yes       Allergies: 1)  ! * Niaspan  Anticoagulation Management History:      The patient is taking warfarin and comes in today for a routine follow up visit.  Positive risk factors for bleeding include presence of serious comorbidities.  Negative risk factors for bleeding include an age less than 37 years old.  The bleeding index is 'intermediate risk'.  Positive CHADS2 values include History of HTN and History of Diabetes.  Negative CHADS2 values include Age > 61 years old.  Anticoagulation responsible provider: Juanda Chance MD, Smitty Cords.  INR POC: 1.1.  Cuvette Lot#: [id number].  Exp: 09/12.    Anticoagulation Management Assessment/Plan:      The patient's current anticoagulation dose is Coumadin 5 mg tabs: take as directed.  The target INR is 2.0-3.0.  The next INR is due 03/25/2010.  Anticoagulation instructions were given to patient.  Results were reviewed/authorized by Weston Brass, PharmD.  He was notified by Weston Brass PharmD.         Current Anticoagulation Instructions: INR 1.1  Take 1 1/2 tablets today and tomorrow then resume same dose of 1 tablet every day.

## 2010-10-23 NOTE — Letter (Signed)
Summary: Patient Notice- Polyp Results  [facility]  [address] [place], [place] [address]   Phone: [phone]  Fax: [phone]        November 05, 2009 MRN: [id number]    Neil Carroll [address] [place], [place]  [address]    Dear Mr. VOILES,  I am pleased to inform you that the colon polyp(s) removed during your recent colonoscopy was (were) found to be benign (no cancer detected) upon pathologic examination.  I recommend you have a repeat colonoscopy examination in 5_ years to look for recurrent polyps, as having colon polyps increases your risk for having recurrent polyps or even colon cancer in the future.  Should you develop new or worsening symptoms of abdominal pain, bowel habit changes or bleeding from the rectum or bowels, please schedule an evaluation with either your primary care physician or with me.  Additional information/recommendations:  __ No further action with gastroenterology is needed at this time. Please      follow-up with your primary care physician for your other healthcare      needs.  __ Please call [phone] to schedule a return visit to review your      situation.  __ Please keep your follow-up visit as already scheduled.  _x_ Continue treatment plan as outlined the day of your exam.  Please call us if you are having persistent problems or have questions about your condition that have not been fully answered at this time.  Sincerely,  Louis Meckel MD  This letter has been electronically signed by your physician.  Appended Document: Patient Notice- Polyp Results Letter mailed 2.16.11

## 2010-10-23 NOTE — Assessment & Plan Note (Signed)
Summary: per check out/sf   Visit Type:  f/u Primary Provider:  Marga Melnick MD  CC:  headaches in the mornings.  History of Present Illness: Neil Carroll is seen  following  ablation for atrial flutter April 02, 2010.  He did have some hemoptysis while on Coumadin and was seen by pulmonology. Repeat chest x-ray showed some scarring but no new infiltrates. Pulmonary felt the hemoptysis was related to trauma after the transesophageal echo guided ablation. His symptoms resolved. He has bveen kept on Coumadin here todate  He has had no recurrent palpitations  Review of his old chart __Transient lobal amnesia with a neurological eval incl CT >>no stroke-  no MRI   .  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 1 By Mouth Two Times A Day 2)  Fortamet 1000 Mg  Tb24 (Metformin Hcl) .[name].. 1 By Mouth Once Daily 3)  Advair Diskus 100-50 Mcg/dose  Misc (Fluticasone-Salmeterol) .... Bid 4)  Gnp Flax Seed Oil 1000 Mg  Caps (Flaxseed (Linseed)) .... Daily 5)  Co Q-10 Vitamin E Fish Oil 60-90-25-200  Caps (Dha-Epa-Coenzyme Q10-Vitamin E) .... Take Daily 6)  Aspirin Ec 325 Mg Tbec (Aspirin) .... Take One Tablet By Mouth Daily 7)  Onetouch Ultra Test  Strp  (Glucose Blood) .... Pt Test 4 Times A Day 8)  Crestor 20 Mg Tabs (Rosuvastatin Calcium) .[name].. 1 By Mouth Once Daily 9)  Onetouch Ultrasoft Lancets  Misc (Lancets) .... Test Twice Daily 10)  Ketoconazole 2 % Crea (Ketoconazole) .... Apply 2 Times Daily and Blow Dry 11)  Bactroban 2 % Crea (Mupirocin Calcium) .... Apply Two Times A Day After Cleansing 12)  Vitamin E 400 Unit Caps (Vitamin E) .... Take 1 Capsule By Mouth Once A Day 13)  Metoprolol Tartrate 50 Mg Tabs (Metoprolol Tartrate) .... Take One Tablet By Mouth Twice A Day 14)  Coumadin 5 Mg Tabs (Warfarin Sodium) .... Take As Directed 15)  Soriatane 10 Mg Caps (Acitretin) .... Once A Day  Allergies (verified): 1)  ! 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)- Bilateral lower extremity OTHER AND UNSPECIFIED HYPERLIPIDEMIA (ICD-272.4) UNSPECIFIED ESSENTIAL HYPERTENSION (ICD-401.9) HYPERLIPIDEMIA (ICD-272.4) DIABETES MELLITUS, TYPE II (ICD-250.00) AMNESIA, TRANSIENT GLOBAL (ICD-437.7) NEOPLASM OF UNCERTAIN BEHAVIOR OF SKIN (ICD-238.2) RASH-NONVESICULAR (ICD-782.1) CONTACT DERMATITIS&OTHER ECZEMA DUE TO PLANTS (ICD-692.6) NONDEPENDENT TOBACCO USE DISORDER (ICD-305.1) BRONCHITIS, CHRONIC (ICD-491.9) Atrial flutter - onset 2011  Social History: Last updated: 01/02/2010  The patient is married.  He drinks at least three beers per day.  He smokes one pack of cigarettes per day.   He is  working as a Estate agent Signs:  Patient profile:   60 year old male Height:      7  inches Weight:      242 pounds BMI:     33.87 Pulse rate:   66 / minute BP sitting:   136 / 74  (left arm)  Vitals Entered By: Caralee Ates CMA (June 23, 2010 8:39 AM)  Physical Exam  General:  The patient was alert and oriented in no acute distress. HEENT Normal.  Neck veins were flat, carotids were brisk.  Lungs were clear.  Heart sounds were regular  without murmurs or gallops.  Abdomen was soft with active bowel sounds. There is no clubbing cyanosis or edema. Skin Warm and dry    Impression & Recommendations:  Problem # 1:  ATRIAL FLUTTER (ICD-427.32) S/P RFCA with recurrence of arrhjtymia..  The issue related to anticoagulation is whether the transient neurological symptoms might be attributable toan old CVA.  Not seen on CT but may be evident by MRI  This was done [facility];  i have contacted them and the records are onthere way  we have reviewed an d no evidence of stroke If so then would not stop coumadin, otherwise we will   His updated medication list for this problem includes:    Cilostazol 100 Mg Tabs (Cilostazol) .[name]... 1 by mouth two times a day    Aspirin Ec 325 Mg Tbec (Aspirin) .[name]... Take one tablet by mouth daily    Metoprolol Tartrate 50 Mg Tabs (Metoprolol tartrate) .[name]... Take one tablet by mouth twice a day    Coumadin 5 Mg Tabs (Warfarin sodium) .[name]... Take as directed  Problem # 2:  CHF (ICD-428.0) resolved His updated medication list for this problem includes:    Cilostazol 100 Mg Tabs (Cilostazol) .[name]... 1 by mouth two times a day    Aspirin Ec 325 Mg Tbec (Aspirin) .[name]... Take one tablet by mouth daily    Metoprolol Tartrate 50 Mg Tabs (Metoprolol tartrate) .[name]... Take one tablet by mouth twice a day    Coumadin 5 Mg Tabs (Warfarin sodium) .[name]... Take as directed  Problem # 3:  AMNESIA, TRANSIENT GLOBAL (ICD-437.7) see a bove  Patient Instructions: 1)  Your physician recommends that you continue on your current medications as directed. Please refer to the Current Medication list given to you today. 2)  After speaking with your neurologist, Dr. Peggyann Shoals will determine whether to discontine your coumdin and notifity you at that time.

## 2010-10-23 NOTE — Medication Information (Signed)
Summary: rov/sl  Anticoagulant Therapy  Managed by: Weston Brass, PharmD Referring MD: Excell Seltzer PCP: Marga Melnick MD Supervising MD: Shirlee Latch MD, [place] Indication 1: Atrial Flutter Lab Used: [facility]  Site: [address] INR POC 1.7 INR RANGE 2.0-3.0  Dietary changes: no    Health status changes: no    Bleeding/hemorrhagic complications: no    Recent/future hospitalizations: no    Any changes in medication regimen? no    Recent/future dental: no  Any missed doses?: no       Is patient compliant with meds? yes       Allergies: 1)  ! * Niaspan  Anticoagulation Management History:      The patient is taking warfarin and comes in today for a routine follow up visit.  Positive risk factors for bleeding include presence of serious comorbidities.  Negative risk factors for bleeding include an age less than 57 years old.  The bleeding index is 'intermediate risk'.  Positive CHADS2 values include History of CHF, History of HTN, and History of Diabetes.  Negative CHADS2 values include Age > 51 years old.  His last INR was 1.346 ratio.  Anticoagulation responsible Neil Carroll: Shirlee Latch MD, [place].  INR POC: 1.7.  Cuvette Lot#: [id number].  Exp: 06/2011.    Anticoagulation Management Assessment/Plan:      The patient's current anticoagulation dose is Coumadin 5 mg tabs: take as directed.  The target INR is 2.0-3.0.  The next INR is due 05/22/2010.  Anticoagulation instructions were given to patient.  Results were reviewed/authorized by Weston Brass, PharmD.  He was notified by Liana Gerold, PharmD Candidate.         Prior Anticoagulation Instructions: INR 1.2  Today, August 10th take Coumadin 2 tabs (10 mg). Tomorrow, August 11th take Coumadin 1.5 tabs (7.5 mg).  Then take Coumadin 1.5 tabs (7.5 mg) on Sun, Mon, Wed, Fri and Coumadin 1 tab (5 mg) on Tues, Thur, Sat. Return to clinic in 1 week.       Current Anticoagulation Instructions: INR 1.7  Today take 2  tablets, then begin 1.5 tablets daily except 1 tablet Tue and Sat.  Return to clinic in 13 days.

## 2010-10-23 NOTE — Assessment & Plan Note (Signed)
Summary: FOR RIGHT HEAD AND NECK IS SWOLLEN   Vital Signs:  Patient profile:   60 year old male Weight:      253 pounds Temp:     99.5 degrees F oral Pulse rate:   124 / minute Resp:     15 per minute BP sitting:   120 / 78  (left arm) Cuff size:   large  Vitals Entered By: Shonna Chock (March 06, 2010 4:31 PM) CC: Swelling and pain since Sunday evening (facial/head area) Comments REVIEWED MED LIST, PATIENT AGREED DOSE AND INSTRUCTION CORRECT    Primary Care Provider:  Marga Melnick MD  CC:  Swelling and pain since Sunday evening (facial/head area).  History of Present Illness: Onset 03/02/2010 as pain in R periauricular area  & into R upper neck with swelling of the ear . He noted a knot  in R posterior lymph chain . It was tender but not now. Swelling L occiput as of this am. Rx: Tylenol . His Psorisis has been flaring  .  Allergies: 1)  ! * Niaspan  Review of Systems General:  Complains of chills; denies fever and sweats. Eyes:  Denies discharge, eye pain, red eye, and vision loss-both eyes. ENT:  Denies decreased hearing, ear discharge, and ringing in ears; no frontal headache, facial pain or purulence. Derm:  Complains of rash; denies itching.  Physical Exam  Eyes:  No corneal or conjunctival inflammation noted. EOMI. Perrla. . Vision grossly normal.OS deviates laterally with accommodation Ears:  Minimal tenderness R ear. TMs normal. Exfoliative changes  R ear. Nose:  External nasal examination shows no deformity or inflammation. Nasal mucosa are pink and moist without lesions or exudates. Septum to R Mouth:  Oral mucosa and oropharynx without lesions or exudates.  Teeth in good repair. Skin:  Scattered Psoriasis  Cervical Nodes:  Single tender LN R posterior change & L ant change Axillary Nodes:  No palpable lymphadenopathy   Impression & Recommendations:  Problem # 1:  CELLULITIS (ICD-682.9)  His updated medication list for this problem includes:  Amoxicillin-pot Clavulanate 500-125 Mg Tabs (Amoxicillin-pot clavulanate) .[name]... 1 every 12 hrs with a meal  Problem # 2:  PSORIASIS (ICD-696.1)  Complete Medication List: 1)  Cilostazol 100 Mg Tabs (Cilostazol) .[name].. 1 by mouth two times a day 2)  Fortamet 1000 Mg Tb24 (Metformin hcl) .[name].. 1 by mouth once daily 3)  Advair Diskus 100-50 Mcg/dose Misc (Fluticasone-salmeterol) .... Bid 4)  Combivent 103-18 Mcg/act Aero (Ipratropium-albuterol) .[name].. 1-2 sprays q 4 hours as needed 5)  Gnp Flax Seed Oil 1000 Mg Caps (Flaxseed (linseed)) .... Daily 6)  Co Q-10 Vitamin E Fish Oil 60-90-25-200 Caps (Dha-epa-coenzyme q10-vitamin e) .... Take daily 7)  Aspirin Ec 325 Mg Tbec (Aspirin) .... Take one tablet by mouth daily 8)  Onetouch Ultra Test Strp (Glucose blood) .... Pt test 4 times a day 9)  Crestor 20 Mg Tabs (Rosuvastatin calcium) .[name].. 1 by mouth once daily 10)  Onetouch Ultrasoft Lancets Misc (Lancets) .... Test twice daily 11)  Ketoconazole 2 % Crea (Ketoconazole) .... Apply 2 times daily and blow dry 12)  Bactroban 2 % Crea (Mupirocin calcium) .... Apply two times a day after cleansing 13)  Amoxicillin-pot Clavulanate 500-125 Mg Tabs (Amoxicillin-pot clavulanate) .[name].. 1 every 12 hrs with a meal  Patient Instructions: 1)  Report Warning Signs  as discussed.Take 650-1000mg  of Tylenol every 4-6 hours as needed for relief of pain or comfort of fever AVOID taking more than 4000mg   in  a 24 hour period (can cause liver damage in higher doses) or take 400-600mg  of Ibuprofen (Advil, Motrin) with food every 4-6 hours as needed for relief of pain or comfort of fever. Prescriptions: AMOXICILLIN-POT CLAVULANATE 500-125 MG TABS (AMOXICILLIN-POT CLAVULANATE) 1 every 12 hrs with a meal  #20 x 0   Entered and Authorized by:   Marga Melnick MD   Signed by:   Marga Melnick MD on 03/06/2010   Method used:   Faxed to ...       [facility] (retail)       [address]        [place], [place]  [id number]       Ph: [phone]       Fax: [phone]   RxID:   [id number] BACTROBAN 2 % CREA (MUPIROCIN CALCIUM) apply two times a day after cleansing  #15 grams x 1   Entered and Authorized by:   Marga Melnick MD   Signed by:   Marga Melnick MD on 03/06/2010   Method used:   Faxed to ...       [facility] (retail)       [address]       [place], [place]  [id number]       Ph: [phone]       Fax: [phone]   RxID:   [id number]

## 2010-10-23 NOTE — Letter (Signed)
Summary: Colonoscopy Letter  [facility]  [address] [place], [place] [address]   Phone: [phone]  Fax: [phone]      September 25, 2009 MRN: [id number]   Neil Carroll [address] [place], [place]  [address]   Dear [name],   According to your medical record, it is time for you to schedule a Colonoscopy. The American Cancer Society recommends this procedure as a method to detect early colon cancer. Patients with a family history of colon cancer, or a personal history of colon polyps or inflammatory bowel disease are at increased risk.  This letter has beeen generated based on the recommendations made at the time of your procedure. If you feel that in your particular situation this may no longer apply, please contact our office.  Please call our office at [phone] to schedule this appointment or to update your records at your earliest convenience.  Thank you for cooperating with [name] to provide you with the very best care possible.   Sincerely,  Barbette Hair. Arlyce Dice, M.D.  [facility] [phone]

## 2010-10-23 NOTE — Miscellaneous (Signed)
Summary: Lec previsit  Clinical Lists Changes  Medications: Added new medication of MOVIPREP 100 GM  SOLR (PEG-KCL-NACL-NASULF-NA ASC-C) As per prep instructions. - Signed Rx of MOVIPREP 100 GM  SOLR (PEG-KCL-NACL-NASULF-NA ASC-C) As per prep instructions.;  #1 x 0;  Signed;  Entered by: Ulis Rias RN;  Authorized by: Louis Meckel MD;  Method used: Electronically to [facility] [address], [place], [place]  [id number], Ph: [phone], Fax: [phone]    Prescriptions: MOVIPREP 100 GM  SOLR (PEG-KCL-NACL-NASULF-NA ASC-C) As per prep instructions.  #1 x 0   Entered by:   Ulis Rias RN   Authorized by:   Louis Meckel MD   Signed by:   Ulis Rias RN on 10/18/2009   Method used:   Electronically to        [facility] (retail)       [address]       [place], [place]  [id number]       Ph: [phone]       Fax: [phone]   RxID:   [id number]   Appended Document: Lec previsit Called pt back at home to clarify with him that he was on a medication for diabetes and to not take his Fortamet the am of the procedure. Pt states he understands!  Appended Document: Lec previsit Dr Ginger Carne pt came in today for a previsit and is on Cilostazol (Pletal 100mg  daily) as a blood thinner. He is scheduled for a colonoscopy on 11/01/09 with you for history of polyps. Do we need to hold his Pletal for several days or discuss this with his PCP.Please advise.Thanks

## 2010-10-23 NOTE — Consult Note (Signed)
Summary: [facility]  [facility]   Imported By: Lanelle Bal 02/03/2010 11:50:37  _____________________________________________________________________  External Attachment:    Type:   Image     Comment:   External Document

## 2010-12-06 LAB — CBC
MCH: 29.1 pg (ref 26.0–34.0)
Platelets: 149 10*3/uL — ABNORMAL LOW (ref 150–400)
RBC: 4.42 MIL/uL (ref 4.22–5.81)
WBC: 7.2 10*3/uL (ref 4.0–10.5)

## 2010-12-06 LAB — HEPARIN LEVEL (UNFRACTIONATED): Heparin Unfractionated: 0.49 IU/mL (ref 0.30–0.70)

## 2010-12-06 LAB — BASIC METABOLIC PANEL
Calcium: 8.5 mg/dL (ref 8.4–10.5)
Creatinine, Ser: 1.05 mg/dL (ref 0.4–1.5)
GFR calc Af Amer: >60 mL/min (ref >60)
GFR calc non Af Amer: >60 mL/min (ref >60)

## 2010-12-06 LAB — PROTIME-INR: INR: 2.22 — ABNORMAL HIGH (ref 0.00–1.49)

## 2010-12-06 LAB — GLUCOSE, CAPILLARY: Glucose-Capillary: 122 mg/dL — ABNORMAL HIGH (ref 70–99)

## 2010-12-07 LAB — CBC
MCH: 29 pg (ref 26.0–34.0)
Platelets: 141 10*3/uL — ABNORMAL LOW (ref 150–400)
RBC: 4.72 MIL/uL (ref 4.22–5.81)

## 2010-12-07 LAB — PROTIME-INR: Prothrombin Time: 20.9 seconds — ABNORMAL HIGH (ref 11.6–15.2)

## 2010-12-07 LAB — GLUCOSE, CAPILLARY
Glucose-Capillary: 102 mg/dL — ABNORMAL HIGH (ref 70–99)
Glucose-Capillary: 105 mg/dL — ABNORMAL HIGH (ref 70–99)
Glucose-Capillary: 112 mg/dL — ABNORMAL HIGH (ref 70–99)
Glucose-Capillary: 83 mg/dL (ref 70–99)

## 2010-12-07 LAB — HEPARIN LEVEL (UNFRACTIONATED)
Heparin Unfractionated: 0.42 IU/mL (ref 0.30–0.70)
Heparin Unfractionated: 0.47 IU/mL (ref 0.30–0.70)

## 2010-12-10 ENCOUNTER — Encounter: Payer: Self-pay | Admitting: Cardiovascular Disease

## 2010-12-10 LAB — BASIC METABOLIC PANEL
BUN: 6 mg/dL (ref 6–23)
CO2: 25 mEq/L (ref 19–32)
Chloride: 105 mEq/L (ref 96–112)
Creatinine, Ser: 0.9 mg/dL (ref 0.4–1.5)
Glucose, Bld: 167 mg/dL — ABNORMAL HIGH (ref 70–99)

## 2010-12-10 LAB — DIFFERENTIAL
Basophils Absolute: 0 10*3/uL (ref 0.0–0.1)
Basophils Relative: 0 % (ref 0–1)
Lymphocytes Relative: 11 % — ABNORMAL LOW (ref 12–46)
Monocytes Absolute: 0.7 10*3/uL (ref 0.1–1.0)
Neutro Abs: 7.5 10*3/uL (ref 1.7–7.7)
Neutrophils Relative %: 81 % — ABNORMAL HIGH (ref 43–77)

## 2010-12-10 LAB — CBC
MCHC: 32.8 g/dL (ref 30.0–36.0)
MCV: 88.5 fL (ref 78.0–100.0)
Platelets: 171 10*3/uL (ref 150–400)

## 2010-12-23 ENCOUNTER — Encounter: Payer: Self-pay | Admitting: Cardiovascular Disease

## 2010-12-23 ENCOUNTER — Ambulatory Visit (INDEPENDENT_AMBULATORY_CARE_PROVIDER_SITE_OTHER): Payer: Federal, State, Local not specified - PPO | Admitting: Cardiovascular Disease

## 2010-12-23 DIAGNOSIS — I7389 Other specified peripheral vascular diseases: Secondary | ICD-10-CM

## 2010-12-23 DIAGNOSIS — I4892 Unspecified atrial flutter: Secondary | ICD-10-CM

## 2010-12-23 DIAGNOSIS — I10 Essential (primary) hypertension: Secondary | ICD-10-CM

## 2010-12-23 MED ORDER — LOSARTAN POTASSIUM 100 MG PO TABS: 100.0000 mg | ORAL_TABLET | Freq: Every day | ORAL | Status: DC

## 2010-12-23 NOTE — Progress Notes (Signed)
HPI:  This is a 60 year old gentleman presenting for followup of peripheral arterial disease and atrial flutter. The patient underwent atrial flutter ablation last year. This was a successful procedure and he's had no recurrent palpitations or documented arrhythmias. He is no longer taking warfarin. 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 He complains of intermittent claudication of the calf muscles bilaterally. Both sides are affected equally. He denies resting foot or leg pain. He's had no foot ulcerations.  The patient denies chest pain or shortness of breath. He has not been engaged in regular exercise. He reports compliance with his medications.  Outpatient Encounter Prescriptions as of 12/23/2010  Medication Sig Dispense Refill  . acitretin (SORIATANE) 10 MG capsule Take 10 mg by mouth daily before breakfast.        . aspirin 325 MG tablet Take 325 mg by mouth daily.        . cilostazol (PLETAL) 100 MG tablet Take 100 mg by mouth 2 (two) times daily.        . Flaxseed, Linseed, (FLAX SEED OIL) 1000 MG CAPS Take 1 capsule by mouth daily.        . Fluticasone-Salmeterol (ADVAIR DISKUS) 100-50 MCG/DOSE AEPB Inhale 1 puff into the lungs every 12 (twelve) hours.        [name] ketoconazole (NIZORAL) 2 % cream Apply topically 2 (two) times daily.        . metformin (FORTAMET) 1000 MG (OSM) 24 hr tablet Take 1,000 mg by mouth daily with breakfast.        . metoprolol (LOPRESSOR) 50 MG tablet Take 50 mg by mouth 2 (two) times daily.        . mupirocin (BACTROBAN) 2 % cream Apply topically 2 (two) times daily.        . Omega-3 Fatty Acids (FISH OIL) 1000 MG CAPS Take 2 capsules by mouth daily.        . rosuvastatin (CRESTOR) 20 MG tablet Take 20 mg by mouth daily.        . DHA-EPA-Coenzyme Q10-Vitamin E 60-90-25-200 CAPS Take 1 capsule by mouth daily.        [name] losartan (COZAAR) 100 MG tablet Take 1 tablet (100 mg total) by mouth daily.  30 tablet  11  . vitamin E 400  UNIT capsule Take 400 Units by mouth daily.          Allergies  Allergen Reactions  . Niacin     REACTION: upset stomach    Past Medical History  Diagnosis Date  . Other peripheral vascular disease     bilateral lower extremity  . Hyperlipidemia   . Hypertension   . Diabetes mellitus     type II  . Transient global amnesia   . Neoplasm of uncertain behavior of skin   . Rash     nonvesicular  . Contact dermatitis and other eczema due to plants (except food)   . Tobacco use disorder     nondependent  . Atrial flutter     onset 2011    ROS: Negative except as per HPI  BP 150/90  Pulse 68  Resp 18  Ht 5\' 11"  (1.803 m)  Wt 259 lb 12.8 oz (117.845 kg)  BMI 36.23 kg/m2  PHYSICAL EXAM: Pt is alert and oriented, overweight male in NAD HEENT: normal Neck: JVP - normal, carotids 2+= without bruits Lungs: CTA bilaterally CV: RRR without murmur or gallop Abd: soft, NT, Positive BS, no hepatomegaly Ext:  no C/C/E, distal pulses nonpalpable Skin: warm/dry no rash  EKG:  Normal sinus rhythm at 63 beats per minute, nonspecific T-wave abnormality, otherwise within normal limits.  ASSESSMENT AND PLAN:

## 2010-12-23 NOTE — Assessment & Plan Note (Signed)
The patient remains in sinus rhythm. Anticoagulation is no longer warranted.

## 2010-12-23 NOTE — Assessment & Plan Note (Signed)
The patient has stable intermittent claudication. His ABIs have been in the range of 0.4-0.5 bilaterally. Unfortunately the patient continues to smoke cigarettes and he was again counseled regarding the importance of cessation. He is on appropriate antiplatelet therapy with aspirin and cilostazol. We also reviewed the importance of increased walking.

## 2010-12-23 NOTE — Assessment & Plan Note (Signed)
Blood pressure control is suboptimal. Recommend add losartan 100 mg daily. This should give secondary benefit in the presence of diabetes and peripheral arterial disease. Will plan on followup with the metabolic panel in 3-4 weeks.

## 2010-12-23 NOTE — Patient Instructions (Signed)
Your physician has recommended you make the following change in your medication: START Losartan 100mg  one tablet by mouth daily  Your physician wants you to follow-up in: 1 YEAR.  You will receive a reminder letter in the mail two months in advance. If you don't receive a letter, please call our office to schedule the follow-up appointment.  Your physician recommends that you return for lab work in: 3 WEEKS (BMP 401.1)

## 2010-12-24 ENCOUNTER — Ambulatory Visit (INDEPENDENT_AMBULATORY_CARE_PROVIDER_SITE_OTHER): Payer: Federal, State, Local not specified - PPO | Admitting: Internal Medicine

## 2010-12-24 VITALS — BP 136/88 | HR 72 | Temp 98.6°F | Wt 257.6 lb

## 2010-12-24 DIAGNOSIS — J3489 Other specified disorders of nose and nasal sinuses: Secondary | ICD-10-CM

## 2010-12-24 DIAGNOSIS — J340 Abscess, furuncle and carbuncle of nose: Secondary | ICD-10-CM

## 2010-12-24 MED ORDER — MUPIROCIN 2 % EX OINT: TOPICAL_OINTMENT | CUTANEOUS | Status: DC

## 2010-12-24 NOTE — Progress Notes (Signed)
  Subjective:    Patient ID: Neil Carroll, male    DOB: 04-Oct-1950, 60 y.o.   MRN: [id number]  HPI Mr. Camilli has had  a right nasal lesion with pain for several weeks. Additionally there's been similar symptoms on the left as well X 1 week.  This is actually a chronic, recurrent problem over several years.  He has no acute rhinosinusitis symptoms of nasal congestion/obstruction; nasal purulence; facial pain; anosmia; fatigue; fever; headache; halitosis; earache and dental pain. No fever , chills , sweats or weight loss.He has symptoms of chronic bronchitis in context of smoking 1 ppd.  Review of Systems     Objective:   Physical Exam Not acutely ill.Ears:  External ear exam shows no significant lesions or deformities.  Otoscopic examination reveals clear canals, tympanic membranes are intact bilaterally without bulging, retraction, inflammation or discharge. Hearing is grossly normal bilaterally.Nose:  External nasal examination shows no deformity or inflammation. Nasal mucosa are  Dry & slightly erythematous. There is a crusted  Lesion of the R septum. No septal dislocation or dislocation.No obstruction to airflow. No cervical or axillary LA is present.        Assessment & Plan:  #1 he has a crusted lesion of the right and air; this is a chronic recurrent problem.  Plan: #1 gentle cleansing with a mild soap was recommended  when he showers. Bactroban  ointment will be applied twice a day. He's been asked to report any warning signs. If the lesion fails to resolve an ENT consultation would be appropriate.

## 2010-12-24 NOTE — Patient Instructions (Signed)
Use a gentle shampoo such as baby shampoo to cleanse the nasal mucosa when you shower. Apply the ointment twice a day to the affected areas. Please report  fever, purulence,  increasing swelling or pain as discussed.

## 2011-01-02 ENCOUNTER — Other Ambulatory Visit: Payer: Self-pay | Admitting: Internal Medicine

## 2011-01-13 ENCOUNTER — Other Ambulatory Visit: Payer: Federal, State, Local not specified - PPO | Admitting: *Deleted

## 2011-01-19 ENCOUNTER — Other Ambulatory Visit: Payer: Self-pay | Admitting: Internal Medicine

## 2011-01-20 ENCOUNTER — Other Ambulatory Visit: Payer: Federal, State, Local not specified - PPO

## 2011-01-20 ENCOUNTER — Other Ambulatory Visit (INDEPENDENT_AMBULATORY_CARE_PROVIDER_SITE_OTHER): Payer: Federal, State, Local not specified - PPO

## 2011-01-20 ENCOUNTER — Other Ambulatory Visit: Payer: Self-pay | Admitting: Internal Medicine

## 2011-01-20 DIAGNOSIS — E119 Type 2 diabetes mellitus without complications: Secondary | ICD-10-CM

## 2011-01-20 LAB — HEMOGLOBIN A1C: Hgb A1c MFr Bld: 7 % — ABNORMAL HIGH (ref 4.6–6.5)

## 2011-01-21 ENCOUNTER — Other Ambulatory Visit (INDEPENDENT_AMBULATORY_CARE_PROVIDER_SITE_OTHER): Payer: Federal, State, Local not specified - PPO | Admitting: *Deleted

## 2011-01-21 DIAGNOSIS — I10 Essential (primary) hypertension: Secondary | ICD-10-CM

## 2011-01-21 LAB — BASIC METABOLIC PANEL
Calcium: 8.9 mg/dL (ref 8.4–10.5)
GFR: 81.57 mL/min (ref >60.00)
Glucose, Bld: 91 mg/dL (ref 70–99)
Potassium: 4.2 mEq/L (ref 3.5–5.1)
Sodium: 139 mEq/L (ref 135–145)

## 2011-01-29 ENCOUNTER — Other Ambulatory Visit: Payer: Self-pay | Admitting: Internal Medicine

## 2011-02-03 NOTE — Progress Notes (Signed)
[facility]                        PERIPHERAL VASCULAR OFFICE NOTE   NAME:ROGERSMusab, Wingard                 MRN:          [id number]  DATE:08/27/2008                            DOB:          Jul 15, 1951    REASON FOR VISIT:  Lower extremity peripheral arterial disease.   HISTORY OF PRESENT ILLNESS:  Mr. Stamper is a 60 year old gentleman with  bilateral lower extremity claudication.  He has total superficial  femoral artery occlusions.  He is ABIs back in 2004 were 0.4 on the  right and 0.5 on the left.  He has been managed conservatively with  cilostazol and an exercise program.  He has stable bilateral calf aching  at 150 feet of walking.  He has no symptoms at shorter distance.  He  denies rest pain or ischemic ulcerations.  He has had no change in his  symptoms over the last several years.  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   He has no chest pain, dyspnea, edema, or other cardiac symptoms.   MEDICATIONS:  1. Aspirin 81 mg daily.  2. Norvasc 5 mg daily.  3. Fish oil twice daily.  4. Flaxseed oil twice daily.  5. Multivitamin once daily.  6. Cilostazol 100 mg twice daily.  7. Crestor 20 mg daily.  8. Advair twice daily.  9. Combivent daily.   PHYSICAL EXAMINATION:  GENERAL:  The patient is alert and oriented in no  acute distress.  VITAL SIGNS:  Weight is 240 pounds, blood pressure 142/80, heart rate  84, respiratory rate 16.  HEENT:  Normal.  NECK:  Normal carotid upstrokes, no bruits.  JVP normal.  LUNGS:  Clear bilaterally.  HEART:  Regular rate and rhythm with grade 1/6 systolic ejection murmur  along the left sternal border.  ABDOMEN:  Soft, nontender, no organomegaly.  EXTREMITIES:  Femoral pulses are 2+ and easily palpable, popliteal  dorsalis pedis posterior tibial pulses are not palpable.  There is no  clubbing, cyanosis, or edema.  SKIN:  Warm and dry without rash.  There are no  areas of skin breakdown.   ASSESSMENT:  1. Lower extremity peripheral arterial disease with bilateral      superficial femoral artery occlusions.  The patient has stable      symptoms.  Continue current medical program that includes      antiplatelet therapy with aspirin and cilostazol, as well as      treatment of dyslipidemia and hypertension.  I would like to see      Mr. Ryans back in 18 months for followup.  He has had stable      symptoms over many years.  2. Hypertension.  Blood pressure borderline today.  His BP has been      excellent in the past with systolic readings in the low 120s and      diastolic readings in the 70s.  Continue to follow.  Treatment per      Dr. Alwyn Ren.  3. Dyslipidemia.  He remains on cilostazol.  He is also followed by      Dr. Alwyn Ren.  Per his report, his lipids were not as good on his      last check and he was changed to Crestor.  At that point his LDL      was up to 133.  4. For followup I will see Mr. Bradway back in 18 months.     Veverly Fells. Excell Seltzer, MD  Electronically Signed    MDC/MedQ  DD: 08/27/2008  DT: 08/27/2008  Job #: [id number]   cc:   Titus Dubin. Alwyn Ren, MD,FACP,FCCP

## 2011-02-03 NOTE — Assessment & Plan Note (Signed)
[facility]                            CARDIOLOGY OFFICE NOTE   Neil Carroll, Neil Carroll                 MRN:          [id number]  DATE:03/18/2007                            DOB:          08/13/1951    PERIPHERAL VASCULAR OFFICE NOTE   PRIMARY CARE PHYSICIAN:  Titus Dubin. Alwyn Ren, M.D., FACP, [facility].   HISTORY OF PRESENT ILLNESS:  Neil Carroll is a 60 year old gentleman with  bilateral lower extremity claudication.  He has bilateral SFA  occlusions.  in 2004, his ABIs were 0.39 on the right and 0.53 on the  left.  We have managed him conservatively with cilostazol and exercise.  His exercise tolerance has been stable for a number of years at  approximately 150 yards walking on level ground.  He does not find this  lifestyle limiting.  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   He has not had any chest discomfort, exertional dyspnea, palpitations,  syncope, pre-syncope.  He has had some minimal edema at the end of the  day.  He complained of some left arm discomfort.  He says this is  positional and/or related to use.  It is clearly not exertional.   CURRENT MEDICATIONS:  1. Aspirin 81 mg daily.  2. Norvasc 5 mg daily.  3. Fish oil 1000 mg daily.  4. Flax seed oil.  5. Multivitamin.  6. Pletal 100 mg twice daily.  7. He has recently run out of Vytorin 10/40 which he was taking once a      day.   HABITS:  Smokes 1 pack per day still.   PHYSICAL EXAM:  He is generally well-appearing in no distress.  Heart rate is 85, blood pressure 122/70 and equal bilaterally.  Weight  is 246 pounds.  He has no jugular venous distension, thyromegaly, or lymphadenopathy.  Respiratory effort is normal.  Lungs are clear to auscultation.  He has a nondisplaced point of maximal impulse.  There is a regular rate  and rhythm without murmur, rub, or gallop.  ABDOMEN:  Soft, nondistended, nontender.  There is no  hepatosplenomegaly.  Bowel  sounds are normal.  EXTREMITIES:  Warm without cyanosis, clubbing, or edema, or ulceration.  Carotid pulses are 2+ bilaterally without bruit.  Femoral pulses are 2+  bilaterally without bruit.  Popliteal, DP and PT pulses are not palpable  on either side.   STUDIES:  Fasting lipid profile dated September 01, 2006 remarkable for  an LDL of 55, HDL 39, AST 17, ALT 25.  He was taking Niaspan 2 g daily  and Vytorin 10/40 one daily when that was drawn.   IMPRESSION/RECOMMENDATIONS:  1. Lower extremity vascular disease:  Minimally lifestyle limiting.      Continue current recommendations for Pletal and exercise therapy.  2. Hypercholesterolemia:  Continue fish oil and Niaspan.  Switch      Vytorin to simvastatin 40 mg daily.  Check lipids and LFTs in 6      months with a copy to Dr. Alwyn Ren.  3. Tobacco abuse.  I again emphasized the importance of complete  cessation.  He actually expresses interest in doing so.  He says he      has not tolerated Chantix in the past.  Will therefore try      Wellbutrin 150 mg twice daily.  4. Disposition:  The patient will follow up with Dr. Excell Seltzer in 18      months.     Salvadore Farber, MD  Electronically Signed    WED/MedQ  DD: 03/18/2007  DT: 03/18/2007  Job #: [id number]

## 2011-02-06 NOTE — Discharge Summary (Signed)
[facility]  Patient:    Neil Carroll, Neil Carroll                   MRN: [id number] Adm. Date:  [id number] Disc. Date: [id number] Attending:  Montez Morita, Philips John Dictator:   Druscilla Brownie. Shela Nevin, P.A. CC:         Titus Dubin. Alwyn Ren, M.D. [facility]                           Discharge Summary  OPERATIONS:  On Jan 28, 2000, the patient underwent bipolar hemiarthroplasty, right hip.  Ronald A. Darrelyn Hillock, M.D., assisted.  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  DISCHARGE DIAGNOSES: 1. 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 2. Mild postoperative anemia.  CONSULTS:  None.  HISTORY OF PRESENT ILLNESS:  This is 60 year old gentleman successful underwent a bipolar hemiarthroplasty of the left hip in October of 1996 for aseptic necrosis.  He has gone on to develop collapse of the femoral head on the right secondary to the same diagnosis.  He is having extreme difficulty getting about.  Due to the pain, he has to walk with a cane and has a considerable painful lurch.  It was felt that this patient would benefit from surgical intervention and he was admitted for the above procedure.  HOSPITAL COURSE:  The patient tolerated the surgical procedure quite well and began early on working with physical therapy.  The patient had a mild rash on his back.  It was thought to be consistent with some of the pads used for the electrocardiography, etc., during surgery.  Benadryl was used.  He had no real pruritus.  The patient was not placed on Coumadin protocol postoperatively and Ascriptin one q.d. was used.  Due to the fact that he kept running elevated temperatures in the evening, prior to discharge we did a chest x-ray which showed "no acute disease."  We did a CBC which was normal.  The urinalysis was normal.  The patient will monitor his temperature at home.  On the day of discharge, his wound was dry.  On  the morning of discharge, he was afebrile.  It was felt that he could be maintained in his home environment.  Arrangements were made for discharge.  LABORATORY DATA:  Laboratory values in the hospital showed a preoperative hemoglobin of 12.6, a hematocrit of 38.7, and otherwise the CBC was normal. The final hemoglobin was 9.3 with a hematocrit of 28.6.  We did not use his autologous blood.  The preoperative urinalysis was negative.  His blood type was B+.  Blood chemistries were essentially normal.  The preoperative chest x-ray showed no active disease and the postoperative chest x-ray showed no active disease.  The electrocardiography showed sinus rhythm.  CONDITION ON DISCHARGE:  Improved and stable.  DISPOSITION:  The patient is discharged to his home in the care of his family.  ACTIVITY:  He is to continue with the touchdown weightbearing on the operative extremity.  DISCHARGE MEDICATIONS: 1. OxyContin 20 mg, #40, one b.i.d. 2. Percocet 5 mg, #50, one to two q.6h. p.r.n. breakthrough pain. 3. Robaxin 500 mg, #30, one q.6h. p.r.n. pain. 4. Trinsicon, #60, one b.i.d. 5. One Ascriptin a day. 6. Benadryl p.r.n.  FOLLOW-UP:  Return to the center in 10-14 days.  He is to monitor his temperature at home and call if its gets above 101 degrees and stays  there. DD:  01/31/00 TD:  02/03/00 Job: [id number]

## 2011-02-06 NOTE — H&P (Signed)
[facility]  Patient:    Neil Carroll, Neil Carroll            MRN: [id number] Adm. Date:  01/28/00 Attending:  Philips J. Montez Morita, M.D. Dictator:   Druscilla Brownie. Shela Nevin, P.A. CC:         Titus Dubin. Alwyn Ren, M.D. [facility]                         History and Physical  DATE OF BIRTH:  Jan 13, 1951.  CHIEF COMPLAINT:  "Pain in my right hip."  PRESENT ILLNESS:  This 60 year old male is having continuing problems concerning his right hip.  He successfully underwent a bipolar hemiarthroplasty to the left hip in October of 1996 for aseptic necrosis and collapse of the femoral head of the hip.  The left hemiarthroplasty was an uncemented bipolar.  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 and is finding more and more difficulty getting about due to the pain and discomfort.  He really is almost to the point of having his right hip interfering with his job activity.  After much discussion nd consideration and with the severe changes of the right femoral head on x-ray with collapse, it was felt this patient would benefit from surgical intervention with a bipolar hemiarthroplasty versus a total hip arthroplasty to the right hip.  The patient has been seen preoperatively and cleared by Dr. Titus Dubin. Hopper. He has donated two units of autologous blood for this procedure.  PAST MEDICAL HISTORY:  This gentleman has really been in excellent health throughout his lifetime and his only surgery has been a left hemiarthroplasty to the hip with a bipolar system and Press-Fit in October of 1996.  He denies any medical illnesses.  ALLERGIES:  He has no medical allergies.  MEDICATIONS:  He takes no medications.  SOCIAL HISTORY:  The patient is married.  He drinks at least three beers per day; has intake of one and a half packs of cigarettes per day.  (The patient is going to try to cut back or stop at this surgery.)  FAMILY HISTORY:  Positive for  stroke, hypertension and diabetes in the mother and lung cancer in the father.  REVIEW OF SYSTEMS:  CNS:  No seizures or paralysis, numbness or double vision.  RESPIRATORY:  No productive cough.  No hemoptysis.  No shortness of breath. CARDIOVASCULAR:  No chest pain.  No angina.  No orthopnea.  GASTROINTESTINAL: o nausea, vomiting, melena or bloody stools.  GENITOURINARY:  No discharge, dysuria or hematuria.  MUSCULOSKELETAL:  Primarily as in present illness with his right  hip.  PHYSICAL EXAMINATION:  GENERAL:  Alert, cooperative and friendly 60 year old black male whose vital signs are blood pressure 110/70, pulse 72, respirations 12.  HEENT:  Wears glasses.  PERRLA.  EOM intact.  Oropharynx is clear.  CHEST:  Clear to auscultation.  No rhonchi.  No rales.  HEART:  Regular rate and rhythm.  No murmurs are heard.  ABDOMEN:  Soft, nontender, obese.  Liver and spleen not felt.  GENITALIA AND RECTAL:  Not done; not pertinent to present illness.  EXTREMITIES:  The patient has painful lurch with ambulation of the right hip and pain with range of motion with flexion contracture.  ADMITTING DIAGNOSIS:  Right hip avascular necrosis with collapse of femoral head.  PLAN:  The patient will undergo right hip bipolar hemiarthroplasty versus total hip arthroplasty, depending on the condition of the acetabulum,  to be examined at the time of surgery.  Again, he has donated two units of autologous blood for this procedure and has been seen and cleared preoperatively by Dr. Marga Melnick. f we should have any medical problems during this patients hospitalization, we will certainly ask Dr. Alwyn Ren to give [name] his expertise. DD:  01/21/00 TD:  01/21/00 Job: [id number]

## 2011-02-06 NOTE — Op Note (Signed)
[facility]  Patient:    Neil Carroll, Neil Carroll                   MRN: [id number] Proc. Date: 01/28/00 Adm. Date:  [id number] Attending:  Rocky Crafts                           Operative Report  SURGEON:  Philips J. Montez Morita, M.D.  ASSISTANT:  Georges Lynch. Darrelyn Hillock, M.D.  POSTOPERATIVE DIAGNOSIS:  Aseptic necrosis, right hip.  POSTOPERATIVE DIAGNOSIS:  Aseptic necrosis, right hip.  OPERATION:  A hip replacement using an Osteonics hydroxyapatite-coated Omniflex hip stem size #12, with an 18.0 mm distal stem tip, a +0 C-tapered head, and a 52 Universal bipolar head.  This was the same size component as was used on the opposite hip six years ago, with the exception of a larger distal stem tip, moving up from a 16.0 mm to an 18.0 mm.  DESCRIPTION OF PROCEDURE:  After suitable general anesthesia, he was positioned in the left lateral decubitus, and the right hip was prepped and draped routinely. An Austin-Moore approach was utilized, and the gluteal attachment into the femur was released for easier exposure.  The reflected external rotators were used to protect the sciatic nerve.  The capsule was opened in a T-shaped manner.  The head was dislocated and amputated, and the femur was prepared successively up to an 11 reamer and an 11 rasp.  The distal tip was reamed for an 18.  We realized that t could go larger, particularly by going back into the trochanteric area, we were  able then to get a size 12 tapered reamer and a size 12 rasp in.  The acetabulum was cleaned of aggressive synovium.  The acetabulum looked fine and it was felt  that the bipolar would be satisfactory in lieu of his good result on the other side, rather than a total hip.  The size 52 seemed to be a perfect size as well. After a thorough irrigation and cleaning of the area, the hip was implanted. That was the Omniflex with an 18.0 mm distal stem tip, having been reamed to  that, with a 0 C-tapered head, and a 52 bipolar head.  Good stability was achieved.  The capsule was closed in the T-shape, but not really attached back to bone.  The external rotators were put back to do away with the head space.  The wound was hen closed over a Hemovac routinely using PVS suture on the fascia lata and the fascia gluteus maximus, #2-0 coated Vicryl in the subcutaneous, staples in the skin. e lost about 400 cc of blood.  He received none.  He went to the recovery room in  good condition. DD:  01/28/00 TD:  01/29/00 Job: [id number]

## 2011-02-19 ENCOUNTER — Other Ambulatory Visit: Payer: Self-pay | Admitting: Internal Medicine

## 2011-04-21 ENCOUNTER — Ambulatory Visit (INDEPENDENT_AMBULATORY_CARE_PROVIDER_SITE_OTHER): Payer: Federal, State, Local not specified - PPO | Admitting: Internal Medicine

## 2011-04-21 ENCOUNTER — Encounter: Payer: Self-pay | Admitting: Internal Medicine

## 2011-04-21 VITALS — BP 130/86 | HR 91 | Temp 98.5°F | Resp 14 | Ht 72.0 in | Wt 258.0 lb

## 2011-04-21 DIAGNOSIS — I10 Essential (primary) hypertension: Secondary | ICD-10-CM

## 2011-04-21 DIAGNOSIS — E785 Hyperlipidemia, unspecified: Secondary | ICD-10-CM

## 2011-04-21 DIAGNOSIS — F17200 Nicotine dependence, unspecified, uncomplicated: Secondary | ICD-10-CM

## 2011-04-21 DIAGNOSIS — Z23 Encounter for immunization: Secondary | ICD-10-CM

## 2011-04-21 DIAGNOSIS — E119 Type 2 diabetes mellitus without complications: Secondary | ICD-10-CM

## 2011-04-21 DIAGNOSIS — Z86010 Personal history of colonic polyps: Secondary | ICD-10-CM

## 2011-04-21 DIAGNOSIS — J42 Unspecified chronic bronchitis: Secondary | ICD-10-CM

## 2011-04-21 MED ORDER — TETANUS-DIPHTH-ACELL PERTUSSIS 5-2.5-18.5 LF-MCG/0.5 IM SUSP
0.5000 mL | Freq: Once | INTRAMUSCULAR | Status: AC
  Administered 2011-04-21: 0.5 mL via INTRAMUSCULAR

## 2011-04-21 MED ORDER — ROSUVASTATIN CALCIUM 20 MG PO TABS: 20.0000 mg | ORAL_TABLET | Freq: Every day | ORAL | Status: DC

## 2011-04-21 NOTE — Progress Notes (Signed)
Subjective:    Patient ID: ATTILA MCCARTHY, male    DOB: [birth year], 60 y.o.   MRN: [id number]  HPI  Mr Oleksy  is here for a physical; he has no acute issues.     Review of Systems  #1  HYPERTENSION: Disease Monitoring: Blood pressure range-not monitored  Chest pain, palpitations- no ( he had pleuritic  R chest pain over several weeks; it resolved 1 week ago) Claudication: yes when mowing        Dyspnea- no but inactive Medications: Compliance- yes  Lightheadedness,Syncope- no    Edema- no  #2 DIABETES: Disease Monitoring: Blood Sugar ranges-average 128-132  Polyuria/phagia/dipsia/ phagia- no       Visual problems- no; last exam 10/11( no retinopathy) Neuropathy : R great toe Medications: Compliance- yes  Hypoglycemic symptoms- no  #3 HYPERLIPIDEMIA: Disease Monitoring: See symptoms for Hypertension Medications: Compliance- yes  Abd pain, bowel changes- no   Muscle aches- no    ROS See HPI above                  Objective:   Physical Exam Gen.: Healthy and well-nourished in appearance. Alert, appropriate and cooperative throughout exam. Head: Normocephalic without obvious abnormalities;  pattern alopecia  Eyes: No corneal or conjunctival inflammation noted. Pupils equal round reactive to light and accommodation. Fundal exam is benign without hemorrhages, exudate, papilledema. Extraocular motion intact. Vision grossly norma with lenses. Ears: External  ear exam reveals no significant lesions or deformities. Canals clear .TMs normal. Hearing is grossly normal bilaterally. Nose: External nasal exam reveals no deformity or inflammation. Nasal mucosa are pink and moist. No lesions or exudates noted. Septum deviated to R  Mouth: Oral mucosa and oropharynx reveal no lesions or exudates. Teeth in good repair. Neck: No deformities, masses, or tenderness noted. Range of motion &. Thyroid normal. Lungs: Normal respiratory effort; chest expands symmetrically. Lungs:  scattered rales & rhonchi; no  increased work of breathing. Heart: Normal rate and rhythm. Normal S1 and S2. No gallop, click, or rub. Grade 1/2 systolic murmur. Abdomen: Bowel sounds normal; abdomen soft and nontender. No masses, organomegaly. Ventral  hernia noted. Genitalia/DRE:no BPH , nodules or induration. Normal  genitalia    .                                                                                   Musculoskeletal/extremities: No deformity or scoliosis noted of  the thoracic or lumbar spine. No clubbing, cyanosis,  or deformity noted.Trace pedal edema. Range of motion  normal .Tone & strength  normal.Joints normal. Nails bitten short; toenails irregular; L > R . Vascular: Carotid, radial artery, dorsalis pedis  pulses are full and equal. No bruits present. Decreased PTP. Neurologic: Alert and oriented x3. Deep tendon reflexes symmetrical and 1/2+ @ knees . Decreased light touch L great toe.        Skin: Intact without suspicious lesions or rashes.Stasis pigmentation over feet. Scattered Psoriatic  lesions Lymph: No cervical, axillary, or inguinal lymphadenopathy present. Psych: Mood and affect are normal. Normally interactive  Assessment & Plan:   #1 comprehensive physical exam; no acute findings #2 see Problem List with Assessments & Recommendations Plan: see Orders   Note: Serial EKGs compared. EKG 12/23/2010  by Dr. Excell Seltzer demonstrates some decrease in the T  voltage in leads 1, aVL, V5 and V6 compared to 04/30/2010 EKG.

## 2011-04-21 NOTE — Patient Instructions (Addendum)
Preventive Health Care: Exercise at least 30-45 minutes a day,  3-4 days a week.  Eat a low-fat diet with lots of fruits and vegetables, up to 7-9 servings per day.  Eye Doctor - have an eye exam @ least annually.                                                         Health Care Power of Attorney . Complete if not in place ; this  place you in charge of your health care decisions. Eat a low-fat diet with lots of fruits and vegetables, up to 7-9 servings per day. Avoid obesity; your goal is waist measurement < 40 inches.Consume less than 40 grams of sugar per day from foods & drinks with High Fructose Corn Sugar as #1,2,3 or # 4 on label. Follow the low carb nutrition program in [employer] as closely as possible to prevent Diabetes progression & complications. White carbohydrates (potatoes, rice, bread, and pasta) have a high spike of sugar and a high load of sugar. For example a  baked potato has a cup of sugar and a  french fry  2 teaspoons of sugar. Yams, wild  rice, whole grained bread &  wheat pasta have been much lower spike and load of  sugar. Portions should be the size of a deck of cards or your palm.  Please think about quitting smoking. Review the risks we discussed. Please call 1-800-QUIT-NOW 669-523-0554) for free smoking cessation counseling.  Please  schedule fasting Labs in 05/2011 : Lipids,  TSH, A1c , urine microalbumin.

## 2011-04-22 ENCOUNTER — Other Ambulatory Visit: Payer: Self-pay | Admitting: Internal Medicine

## 2011-05-23 ENCOUNTER — Other Ambulatory Visit: Payer: Self-pay | Admitting: Cardiovascular Disease

## 2011-06-23 ENCOUNTER — Other Ambulatory Visit: Payer: Self-pay | Admitting: Internal Medicine

## 2011-06-23 DIAGNOSIS — E785 Hyperlipidemia, unspecified: Secondary | ICD-10-CM

## 2011-06-24 ENCOUNTER — Other Ambulatory Visit (INDEPENDENT_AMBULATORY_CARE_PROVIDER_SITE_OTHER): Payer: Managed Care, Other (non HMO)

## 2011-06-24 DIAGNOSIS — E785 Hyperlipidemia, unspecified: Secondary | ICD-10-CM

## 2011-06-24 LAB — LIPID PANEL
Cholesterol: 98 mg/dL (ref 0–200)
HDL: 32.5 mg/dL — ABNORMAL LOW (ref >39.00)
Triglycerides: 105 mg/dL (ref 0.0–149.0)
VLDL: 21 mg/dL (ref 0.0–40.0)

## 2011-06-24 LAB — MICROALBUMIN / CREATININE URINE RATIO
Creatinine,U: 96.8 mg/dL
Microalb Creat Ratio: 2.4 mg/g (ref 0.0–30.0)

## 2011-06-24 LAB — TSH: TSH: 0.88 u[IU]/mL (ref 0.35–5.50)

## 2011-06-24 NOTE — Progress Notes (Signed)
Labs only

## 2011-07-23 ENCOUNTER — Other Ambulatory Visit: Payer: Self-pay | Admitting: Internal Medicine

## 2011-07-23 NOTE — Telephone Encounter (Signed)
Done

## 2011-12-21 ENCOUNTER — Ambulatory Visit (INDEPENDENT_AMBULATORY_CARE_PROVIDER_SITE_OTHER): Payer: Federal, State, Local not specified - PPO | Admitting: Internal Medicine

## 2011-12-21 ENCOUNTER — Other Ambulatory Visit: Payer: Self-pay | Admitting: Cardiovascular Disease

## 2011-12-21 VITALS — BP 138/98 | HR 106 | Temp 98.2°F | Wt 265.0 lb

## 2011-12-21 DIAGNOSIS — I10 Essential (primary) hypertension: Secondary | ICD-10-CM

## 2011-12-21 DIAGNOSIS — I4892 Unspecified atrial flutter: Secondary | ICD-10-CM

## 2011-12-21 DIAGNOSIS — J209 Acute bronchitis, unspecified: Secondary | ICD-10-CM

## 2011-12-21 MED ORDER — HYDROCODONE-HOMATROPINE 5-1.5 MG/5ML PO SYRP: 5.0000 mL | ORAL_SOLUTION | Freq: Four times a day (QID) | ORAL | Status: DC | PRN

## 2011-12-21 MED ORDER — SULFAMETHOXAZOLE-TRIMETHOPRIM 800-160 MG PO TABS: 1.0000 | ORAL_TABLET | Freq: Two times a day (BID) | ORAL | Status: DC

## 2011-12-21 NOTE — Assessment & Plan Note (Signed)
Blood pressure is suboptimally controlled here; he has been taking NyQuil which may contain a decongestant

## 2011-12-21 NOTE — Assessment & Plan Note (Addendum)
Metoprolol will be increased to 50 mg one & one  half twice a day pending cardiology evaluation. This was done in lieu of adding calcium channel blocker, such as verapamil or diltiazem. The beta blocker does not seem to be aggravating any reactive airways disease.

## 2011-12-21 NOTE — Patient Instructions (Addendum)
Plain Mucinex for thick secretions ;force NON dairy fluids. Use a Neti pot daily as needed for sinus congestion. Nasal cleansing in the shower as discussed. Make sure that all residual soap is removed to prevent irritation. To prevent rapid heart   avoid stimulants such as decongestants, diet pills, nicotine, or caffeine (coffee, tea, cola, or chocolate) to excess.  Consider  [facility]'s smoking cessation program @ www.[place].com or (416)193-0735.  Until seen by the cardiologist, increase metoprolol from 50 mg twice a day to 50 mg 1&1/2 pills twice a day.

## 2011-12-21 NOTE — Progress Notes (Signed)
  Subjective:    Patient ID: Neil Carroll, male    DOB: 11-Sep-1951, 61 y.o.   MRN: [id number]  HPI Respiratory tract infection Onset/symptoms:12/14/11 as sneezing & rhinitis Exposures (illness/environmental/extrinsic):grand daughter ill Progression of symptoms:to headcongestion Treatments/response:Nyquil, Tylenol with help Present symptoms: Fever/chills/sweats:no Frontal headache:no Facial pain:no Nasal purulence:slight Sore throat:no Dental pain:no Lymphadenopathy:no Wheezing/shortness of breath:all Cough/sputum/hemoptysis:brown - white Pleuritic pain:no Associated extrinsic/allergic symptoms:itchy eyes/ sneezing:not now  Smoking history:1.25 ppd           Review of Systems No GERD symptoms except rare dysphagia. Not on ACE-I; he is on an instance in receptor blocker.  Fasting blood sugars range from 132 to 156  Dr. Earmon Phoenix  Cardiology note 01/01/11 reviewed. Pulse at that time was 68     Objective:   Physical Exam General appearance:good health ;well nourished; no acute distress or increased work of breathing is present.  No  lymphadenopathy about the head, neck, or axilla noted.   Eyes: No conjunctival inflammation or lid edema is present.   Ears:  External ear exam shows no significant lesions or deformities.  Otoscopic examination reveals clear canals, tympanic membranes are intact bilaterally without bulging, retraction, inflammation or discharge.  Nose:  External nasal examination shows no deformity or inflammation. Nasal mucosa are pink and moist without lesions or exudates. No septal dislocation or deviation.No obstruction to airflow.   Oral exam: Dental hygiene is good; lips and gums are healthy appearing.There is mild  oropharyngeal erythema  w/o exudate noted.  Hoarse  Neck:  No deformities, thyromegaly, masses, or tenderness noted.   Heart:  Rapid irregular rhythm. Flow  Murmur w/o click, rub or other extra sounds.   Lungs:Chest clear to  auscultation; no wheezes, rhonchi,rales ,or rubs present.No increased work of breathing.    Extremities:  No cyanosis, edema, or clubbing  noted . Fingernails deformed   Skin: Warm & dry           Assessment & Plan:  #1 bronchitis; he describes wheezing. At this time I do not hear any active bronchospasm.

## 2011-12-22 ENCOUNTER — Telehealth: Payer: Self-pay | Admitting: Internal Medicine

## 2011-12-22 NOTE — Telephone Encounter (Signed)
He has atrial flutter with a rapid ventricular response; please see if he can be seen 4/3

## 2011-12-22 NOTE — Telephone Encounter (Signed)
In reference to Cardiology referral, patient is scheduled for this coming Monday, 12-28-11, at [facility] to see PA-Theseus Brion Aliment.  Patient is asking what if he doesn't make it that long?  Patient's understanding was that Dr. Alwyn Ren was very concerned and that this should be an Urgent referral appointment.  Patient states he should be seen by a Cardiologist tomorrow, 12-23-11, because he doesn't feel any better, and wants Dr. Alwyn Ren to be aware of this.  Please advise.

## 2011-12-22 NOTE — Telephone Encounter (Signed)
Dr.Hopper please advise, would you like for Renee (referral coor) to get patient a sooner appointment?

## 2011-12-23 ENCOUNTER — Encounter: Payer: Self-pay | Admitting: Internal Medicine

## 2011-12-23 ENCOUNTER — Encounter (HOSPITAL_COMMUNITY): Payer: Self-pay | Admitting: General Practice

## 2011-12-23 ENCOUNTER — Inpatient Hospital Stay (HOSPITAL_COMMUNITY)
Admission: AD | Admit: 2011-12-23 | Discharge: 2011-12-29 | DRG: 550 | Disposition: A | Discharge status: Home / Self Care | Payer: Federal, State, Local not specified - PPO | Source: Ambulatory Visit | Attending: Cardiology | Admitting: Cardiology

## 2011-12-23 ENCOUNTER — Inpatient Hospital Stay (HOSPITAL_COMMUNITY): Payer: Federal, State, Local not specified - PPO

## 2011-12-23 ENCOUNTER — Other Ambulatory Visit: Payer: Self-pay

## 2011-12-23 ENCOUNTER — Ambulatory Visit (INDEPENDENT_AMBULATORY_CARE_PROVIDER_SITE_OTHER): Payer: Federal, State, Local not specified - PPO | Admitting: Internal Medicine

## 2011-12-23 VITALS — BP 148/92 | HR 120 | Temp 98.2°F | Wt 263.8 lb

## 2011-12-23 DIAGNOSIS — F17200 Nicotine dependence, unspecified, uncomplicated: Secondary | ICD-10-CM | POA: Diagnosis present

## 2011-12-23 DIAGNOSIS — I7389 Other specified peripheral vascular diseases: Secondary | ICD-10-CM

## 2011-12-23 DIAGNOSIS — I4891 Unspecified atrial fibrillation: Secondary | ICD-10-CM

## 2011-12-23 DIAGNOSIS — E1159 Type 2 diabetes mellitus with other circulatory complications: Secondary | ICD-10-CM | POA: Diagnosis present

## 2011-12-23 DIAGNOSIS — J209 Acute bronchitis, unspecified: Secondary | ICD-10-CM

## 2011-12-23 DIAGNOSIS — I5022 Chronic systolic (congestive) heart failure: Secondary | ICD-10-CM | POA: Diagnosis present

## 2011-12-23 DIAGNOSIS — I509 Heart failure, unspecified: Secondary | ICD-10-CM | POA: Diagnosis present

## 2011-12-23 DIAGNOSIS — I4892 Unspecified atrial flutter: Principal | ICD-10-CM | POA: Diagnosis present

## 2011-12-23 DIAGNOSIS — R079 Chest pain, unspecified: Secondary | ICD-10-CM

## 2011-12-23 DIAGNOSIS — I110 Hypertensive heart disease with heart failure: Secondary | ICD-10-CM | POA: Diagnosis present

## 2011-12-23 DIAGNOSIS — R000 Tachycardia, unspecified: Secondary | ICD-10-CM

## 2011-12-23 DIAGNOSIS — Z7982 Long term (current) use of aspirin: Secondary | ICD-10-CM

## 2011-12-23 DIAGNOSIS — I739 Peripheral vascular disease, unspecified: Secondary | ICD-10-CM | POA: Diagnosis present

## 2011-12-23 DIAGNOSIS — R0789 Other chest pain: Secondary | ICD-10-CM

## 2011-12-23 DIAGNOSIS — I10 Essential (primary) hypertension: Secondary | ICD-10-CM | POA: Diagnosis present

## 2011-12-23 DIAGNOSIS — E119 Type 2 diabetes mellitus without complications: Secondary | ICD-10-CM | POA: Diagnosis present

## 2011-12-23 DIAGNOSIS — Z96649 Presence of unspecified artificial hip joint: Secondary | ICD-10-CM

## 2011-12-23 DIAGNOSIS — J441 Chronic obstructive pulmonary disease with (acute) exacerbation: Secondary | ICD-10-CM | POA: Diagnosis present

## 2011-12-23 DIAGNOSIS — E785 Hyperlipidemia, unspecified: Secondary | ICD-10-CM | POA: Diagnosis present

## 2011-12-23 DIAGNOSIS — I519 Heart disease, unspecified: Secondary | ICD-10-CM | POA: Insufficient documentation

## 2011-12-23 HISTORY — DX: Type 2 diabetes mellitus without complications: E11.9

## 2011-12-23 HISTORY — DX: Heart disease, unspecified: I51.9

## 2011-12-23 HISTORY — DX: Gastro-esophageal reflux disease without esophagitis: K21.9

## 2011-12-23 LAB — TSH: TSH: 0.643 u[IU]/mL (ref 0.350–4.500)

## 2011-12-23 LAB — COMPREHENSIVE METABOLIC PANEL
ALT: 17 U/L (ref 0–53)
AST: 16 U/L (ref 0–37)
Albumin: 3.9 g/dL (ref 3.5–5.2)
Alkaline Phosphatase: 61 U/L (ref 39–117)
BUN: 16 mg/dL (ref 6–23)
Chloride: 104 mEq/L (ref 96–112)
Potassium: 4 mEq/L (ref 3.5–5.1)
Sodium: 138 mEq/L (ref 135–145)
Total Bilirubin: 0.2 mg/dL — ABNORMAL LOW (ref 0.3–1.2)

## 2011-12-23 LAB — CARDIAC PANEL(CRET KIN+CKTOT+MB+TROPI)
CK, MB: 2.5 ng/mL (ref 0.3–4.0)
Relative Index: 0.8 (ref 0.0–2.5)
Troponin I: <0.3 ng/mL (ref <0.30)
Troponin I: <0.3 ng/mL (ref <0.30)

## 2011-12-23 LAB — CBC
HCT: 44.5 % (ref 39.0–52.0)
RDW: 13.9 % (ref 11.5–15.5)
WBC: 9.8 10*3/uL (ref 4.0–10.5)

## 2011-12-23 LAB — GLUCOSE, CAPILLARY
Glucose-Capillary: 81 mg/dL (ref 70–99)
Glucose-Capillary: 108 mg/dL — ABNORMAL HIGH (ref 70–99)

## 2011-12-23 LAB — DIFFERENTIAL
Basophils Absolute: 0 10*3/uL (ref 0.0–0.1)
Lymphocytes Relative: 19 % (ref 12–46)
Monocytes Absolute: 0.7 10*3/uL (ref 0.1–1.0)
Neutro Abs: 7.1 10*3/uL (ref 1.7–7.7)

## 2011-12-23 LAB — APTT: aPTT: 33 seconds (ref 24–37)

## 2011-12-23 LAB — PROTIME-INR: INR: 0.94 (ref 0.00–1.49)

## 2011-12-23 MED ORDER — KETOCONAZOLE 2 % EX CREA
TOPICAL_CREAM | Freq: Two times a day (BID) | CUTANEOUS | Status: DC
  Administered 2011-12-24: 1 via TOPICAL
  Administered 2011-12-24 – 2011-12-29 (×9): via TOPICAL
  Filled 2011-12-23: qty 15

## 2011-12-23 MED ORDER — ALPRAZOLAM 0.25 MG PO TABS: 0.2500 mg | ORAL_TABLET | Freq: Two times a day (BID) | ORAL | Status: DC | PRN

## 2011-12-23 MED ORDER — HALOBETASOL PROPIONATE 0.05 % EX CREA
TOPICAL_CREAM | Freq: Two times a day (BID) | CUTANEOUS | Status: DC
  Administered 2011-12-23 – 2011-12-26 (×6): via TOPICAL

## 2011-12-23 MED ORDER — PNEUMOCOCCAL VAC POLYVALENT 25 MCG/0.5ML IJ INJ
0.5000 mL | INJECTION | INTRAMUSCULAR | Status: AC
  Filled 2011-12-23: qty 0.5

## 2011-12-23 MED ORDER — METFORMIN HCL ER 500 MG PO TB24
1000.0000 mg | ORAL_TABLET | Freq: Every day | ORAL | Status: DC
  Administered 2011-12-24 – 2011-12-29 (×5): 1000 mg via ORAL
  Filled 2011-12-23 (×7): qty 2

## 2011-12-23 MED ORDER — ATORVASTATIN CALCIUM 40 MG PO TABS
40.0000 mg | ORAL_TABLET | Freq: Every day | ORAL | Status: DC
  Administered 2011-12-24 – 2011-12-28 (×5): 40 mg via ORAL
  Filled 2011-12-23 (×7): qty 1

## 2011-12-23 MED ORDER — CILOSTAZOL 100 MG PO TABS
100.0000 mg | ORAL_TABLET | Freq: Two times a day (BID) | ORAL | Status: DC
  Administered 2011-12-23 – 2011-12-25 (×5): 100 mg via ORAL
  Filled 2011-12-23 (×7): qty 1

## 2011-12-23 MED ORDER — HEPARIN BOLUS VIA INFUSION
4000.0000 [IU] | Freq: Once | INTRAVENOUS | Status: AC
  Administered 2011-12-23: 4000 [IU] via INTRAVENOUS
  Filled 2011-12-23: qty 4000

## 2011-12-23 MED ORDER — OMEGA-3-ACID ETHYL ESTERS 1 G PO CAPS
1.0000 g | ORAL_CAPSULE | Freq: Every day | ORAL | Status: DC
  Administered 2011-12-23 – 2011-12-29 (×6): 1 g via ORAL
  Filled 2011-12-23 (×7): qty 1

## 2011-12-23 MED ORDER — HYDROCODONE-HOMATROPINE 5-1.5 MG/5ML PO SYRP: 5.0000 mL | ORAL_SOLUTION | Freq: Four times a day (QID) | ORAL | Status: DC | PRN

## 2011-12-23 MED ORDER — CALCITRIOL 3 MCG/GM EX OINT
1.0000 "application " | TOPICAL_OINTMENT | Freq: Every day | CUTANEOUS | Status: DC
  Administered 2011-12-23 – 2011-12-27 (×5): 1 via TOPICAL

## 2011-12-23 MED ORDER — CENTRUM PO TABS: 1.0000 | ORAL_TABLET | Freq: Every day | ORAL | Status: DC

## 2011-12-23 MED ORDER — ACETAMINOPHEN 325 MG PO TABS: 650.0000 mg | ORAL_TABLET | ORAL | Status: DC | PRN

## 2011-12-23 MED ORDER — SULFAMETHOXAZOLE-TMP DS 800-160 MG PO TABS
1.0000 | ORAL_TABLET | Freq: Two times a day (BID) | ORAL | Status: DC
  Administered 2011-12-23 – 2011-12-24 (×2): 1 via ORAL
  Filled 2011-12-23 (×3): qty 1

## 2011-12-23 MED ORDER — ONDANSETRON HCL 4 MG/2ML IJ SOLN: 4.0000 mg | Freq: Four times a day (QID) | INTRAMUSCULAR | Status: DC | PRN

## 2011-12-23 MED ORDER — SULFAMETHOXAZOLE-TRIMETHOPRIM 800-160 MG PO TABS: 1.0000 | ORAL_TABLET | Freq: Two times a day (BID) | ORAL | Status: DC

## 2011-12-23 MED ORDER — HALOBETASOL PROPIONATE 0.05 % EX CREA
TOPICAL_CREAM | Freq: Two times a day (BID) | CUTANEOUS | Status: DC
  Filled 2011-12-23 (×15): qty 15

## 2011-12-23 MED ORDER — NITROGLYCERIN 0.4 MG SL SUBL: 0.4000 mg | SUBLINGUAL_TABLET | SUBLINGUAL | Status: DC | PRN

## 2011-12-23 MED ORDER — METOPROLOL TARTRATE 50 MG PO TABS
75.0000 mg | ORAL_TABLET | Freq: Two times a day (BID) | ORAL | Status: DC
  Administered 2011-12-23 – 2011-12-24 (×3): 75 mg via ORAL
  Filled 2011-12-23 (×5): qty 1

## 2011-12-23 MED ORDER — ZOLPIDEM TARTRATE 5 MG PO TABS: 5.0000 mg | ORAL_TABLET | Freq: Every evening | ORAL | Status: DC | PRN

## 2011-12-23 MED ORDER — LOSARTAN POTASSIUM 50 MG PO TABS
100.0000 mg | ORAL_TABLET | Freq: Every day | ORAL | Status: DC
  Administered 2011-12-24 – 2011-12-29 (×5): 100 mg via ORAL
  Filled 2011-12-23 (×6): qty 2

## 2011-12-23 MED ORDER — ACITRETIN 25 MG PO CAPS
25.0000 mg | ORAL_CAPSULE | Freq: Every day | ORAL | Status: DC
  Administered 2011-12-25: 25 mg via ORAL
  Filled 2011-12-23 (×7): qty 1

## 2011-12-23 MED ORDER — ADULT MULTIVITAMIN W/MINERALS CH
1.0000 | ORAL_TABLET | Freq: Every day | ORAL | Status: DC
  Administered 2011-12-24 – 2011-12-29 (×5): 1 via ORAL
  Filled 2011-12-23 (×6): qty 1

## 2011-12-23 MED ORDER — FLAX SEED OIL 1000 MG PO CAPS: 1.0000 | ORAL_CAPSULE | Freq: Every day | ORAL | Status: DC

## 2011-12-23 MED ORDER — METOPROLOL TARTRATE 50 MG PO TABS
50.0000 mg | ORAL_TABLET | Freq: Three times a day (TID) | ORAL | Status: DC
  Filled 2011-12-23 (×3): qty 1

## 2011-12-23 MED ORDER — ASPIRIN 325 MG PO TABS
325.0000 mg | ORAL_TABLET | Freq: Every day | ORAL | Status: DC
  Administered 2011-12-24 – 2011-12-27 (×4): 325 mg via ORAL
  Filled 2011-12-23 (×5): qty 1

## 2011-12-23 MED ORDER — DILTIAZEM LOAD VIA INFUSION
10.0000 mg | Freq: Once | INTRAVENOUS | Status: AC
  Administered 2011-12-23: 10 mg via INTRAVENOUS
  Filled 2011-12-23: qty 10

## 2011-12-23 MED ORDER — METOPROLOL TARTRATE 50 MG PO TABS
75.0000 mg | ORAL_TABLET | Freq: Three times a day (TID) | ORAL | Status: DC
  Filled 2011-12-23 (×3): qty 1

## 2011-12-23 MED ORDER — DILTIAZEM HCL 100 MG IV SOLR
10.0000 mg/h | INTRAVENOUS | Status: DC
  Administered 2011-12-23 – 2011-12-24 (×2): 10 mg/h via INTRAVENOUS
  Filled 2011-12-23 (×2): qty 100

## 2011-12-23 MED ORDER — HEPARIN (PORCINE) IN NACL 100-0.45 UNIT/ML-% IJ SOLN
1650.0000 [IU]/h | INTRAMUSCULAR | Status: DC
  Administered 2011-12-23 (×2): 1300 [IU]/h via INTRAVENOUS
  Administered 2011-12-24 – 2011-12-25 (×3): 1450 [IU]/h via INTRAVENOUS
  Filled 2011-12-23 (×5): qty 250

## 2011-12-23 MED ORDER — FLUTICASONE-SALMETEROL 100-50 MCG/DOSE IN AEPB
1.0000 | INHALATION_SPRAY | Freq: Two times a day (BID) | RESPIRATORY_TRACT | Status: DC
Start: 2011-12-23 — End: 2011-12-29
  Administered 2011-12-23 – 2011-12-29 (×12): 1 via RESPIRATORY_TRACT
  Filled 2011-12-23: qty 14

## 2011-12-23 NOTE — Progress Notes (Signed)
  Subjective:    Patient ID: Neil Carroll, male    DOB: [birth year], 61 y.o.   MRN: [id number]  HPI In retrospect he's noted intermittent tachycardia for approximately 1-1/2 months. He did not pursue followup as he thought  appointment with Dr. Excell Seltzer was pending  later this month. He had not heard in reference to that annual F/U, but he was offered an appointment 4/8 based on my note of 4/1. I asked him to return emergently to assess the response of his tachycardia to increase the beta blocker  Clinically he exhibited tachycardia when seen for acute bronchitis 12/21/11. The cough has improved with antibiotic. He questions whether the cough syrup might aggravate the tachycardia. He has avoided decongestants since that visit.  He mainly notes tachycardia at night when lying on his side at rest. He believes the increase in metoprolol to 75 mg twice a day has helped the tachycardia.  He did not subjectively note  tachycardia at the time his EKG was done. The pulse rate was 120. There were diffuse nonspecific ST-T changes.  EKG 12/23/10 revealed normal sinus rhythm with a rate of 63. T wave is flat in lead 1 and flat to inverted in lead 3. T wave was low and V6.    Review of Systems   He describes intermittent chest tightness with the tachycardia. He has been less active so has not noted exertional dyspnea or claudication. He has not experienced persistent paroxysmal nocturnal dyspnea.  He denies fever, chills,or sweats. The sputum character is improving     Objective:   Physical Exam  He is in no acute distress.  He has no lymphadenopathy about the neck or axilla.  Thyroid is normal to palpation.  Extraocular motion is normal without midline or proptosis  Breath sounds are decreased but there is no increased work of breathing or abnormal breath sounds. O2 sats are 95%  He exhibits a distant tachycardia. Pulse varies from 116-126 on  pulse oximetry  There is no neck vein distention  or hepatojugular reflux.  He has some deformities the nails without cyanosis or clubbing. There is 1/2+ pitting edema. Homans sign is negative        Assessment & Plan:  #1 tachycardia with pulse rates into the 120s with intermittent chest tightness. This is associated with nonspecific ST-T wave changes. The rate has increased despite increasing his beta blockers and avoiding decongestants.  He is aware of the tachycardia only intermittently; it's possible that the rate is even higher at times  Cardiology will be contacted as to dispensation

## 2011-12-23 NOTE — Patient Instructions (Signed)
.  Share results with  Cardiology

## 2011-12-23 NOTE — Progress Notes (Signed)
ANTICOAGULATION CONSULT NOTE - Initial Consult  Pharmacy Consult for heparin Indication: a flutter  Allergies  Allergen Reactions  . Niacin Other (See Comments)    REACTION: upset stomach    Patient Measurements:   Heparin Dosing Weight: ~90 kg  Vital Signs: Temp: 98.2 F (36.8 C) (04/03 1316) Temp src: Oral (04/03 1316) BP: 148/92 mmHg (04/03 1316) Pulse Rate: 120  (04/03 1316)  Labs: No results found for this basename: HGB:2,HCT:3,PLT:3,APTT:3,LABPROT:3,INR:3,HEPARINUNFRC:3,CREATININE:3,CKTOTAL:3,CKMB:3,TROPONINI:3 in the last 72 hours The CrCl is unknown because both a height and weight (above a minimum accepted value) are required for this calculation.  Medical History: Past Medical History  Diagnosis Date  . Other peripheral vascular disease     bilateral lower extremity  . Hyperlipidemia   . Hypertension   . Transient global amnesia   . Contact dermatitis and other eczema due to plants (except food)   . Tobacco use disorder     nondependent  . Atrial flutter     onset  2011  . Arrhythmia     tachycardia  . Type II diabetes mellitus   . Chronic bronchitis   . GERD (gastroesophageal reflux disease)     Medications:  Prescriptions prior to admission  Medication Sig Dispense Refill  . acitretin (SORIATANE) 25 MG capsule Take 25 mg by mouth daily before breakfast.      . ADVAIR DISKUS 100-50 MCG/DOSE AEPB inhale 1 dose by mouth twice a day  60 each  5  . ammonium lactate (AMLACTIN) 12 % cream Apply topically as needed.      [name] aspirin 325 MG tablet Take 325 mg by mouth daily.        . Calcitriol (VECTICAL) 3 MCG/GM cream Apply topically at bedtime.      . cilostazol (PLETAL) 100 MG tablet take 1 tablet by mouth twice a day  60 tablet  5  . CRESTOR 20 MG tablet take 1 tablet by mouth once daily  30 tablet  3  . Flaxseed, Linseed, (FLAX SEED OIL) 1000 MG CAPS Take 1 capsule by mouth daily.        . FORTAMET 1000 MG 24 hr tablet take 1 tablet by mouth once daily   90 tablet  1  . halobetasol (ULTRAVATE) 0.05 % cream Apply topically 2 (two) times daily.      [name] HYDROcodone-homatropine (HYDROMET) 5-1.5 MG/5ML syrup Take 5 mLs by mouth every 6 (six) hours as needed for cough.  120 mL  0  . ketoconazole (NIZORAL) 2 % cream Apply topically 2 (two) times daily.        [name] losartan (COZAAR) 100 MG tablet take 1 tablet by mouth once daily  30 tablet  1  . metoprolol (LOPRESSOR) 50 MG tablet take 1 tablet by mouth twice a day  60 tablet  1  . Multiple Vitamins-Minerals (CENTRUM PO) Take by mouth daily.        . mupirocin (BACTROBAN) 2 % ointment Apply to affected area 2 times daily  15 g  0  . Omega-3 Fatty Acids (FISH OIL) 1000 MG CAPS Take 2 capsules by mouth daily.        . rosuvastatin (CRESTOR) 20 MG tablet Take 1 tablet (20 mg total) by mouth daily.  30 tablet  11  . sulfamethoxazole-trimethoprim (BACTRIM DS,SEPTRA DS) 800-160 MG per tablet Take 1 tablet by mouth 2 (two) times daily.  20 tablet  0  . triamcinolone cream (KENALOG) 0.1 % Apply topically 2 (two) times daily.  Assessment: 61 yo man to start heparin for atrial flutter Goal of Therapy:  Heparin level 0.3-0.7 units/ml   Plan:  Heparin 4000 units bolus and drip at 1300 units/hr Check 8 hour heparin level and CBC then daily while on heparin.  Talbert Cage Poteet 12/23/2011,5:22 PM

## 2011-12-23 NOTE — Telephone Encounter (Signed)
Patient coming in at 1:00 today. I instructed him to bring all medication bottles. BC

## 2011-12-23 NOTE — Telephone Encounter (Signed)
Please contact patient for OV today, bring all medications

## 2011-12-23 NOTE — Progress Notes (Signed)
Addended by: Maurice Small on: 12/23/2011 02:26 PM   Modules accepted: Orders

## 2011-12-23 NOTE — Telephone Encounter (Signed)
Per my call to [facility], in order for patient to be seen today, Dr. Alwyn Ren will need to call and speak with Dr. Daleen Squibb, who is the doctor of the day.

## 2011-12-23 NOTE — H&P (Signed)
History and Physical  Patient ID: Neil Carroll Patient ID: Neil Carroll MRN: [id number], DOB/AGE: May 25, 1951 61 y.o. Date of Encounter: 12/23/2011  Primary Physician: Marga Melnick, MD, MD Primary Cardiologist: [facility]  Chief Complaint:  Atrial flutter  HPI: Neil Carroll is a 61 -year-old male with a history of atrial flutter. His saw Dr. Alwyn Ren on 12/21/2011 for bronchitis. He was having palpitations and was noted to be in atrial flutter with rapid ventricular response. His metoprolol was increased to 75 mg twice a day. Neil. Carroll tolerated this dose well but continued to have intermittent palpitations. He saw Dr. Clearance Coots again and was referred to [facility] for further evaluation and treatment.  Neil. Carroll history tachycardia palpitations the first about a month and a half ago. Since then, he has had repeated episodes, generally with exertion. When he exerts himself, he will fill tachycardia palpitations, then get short of breath, then get chest tightness and a 5/10. He will rest and the symptoms will gradually resolve, sometimes lasting about 30 minutes. He has not had any resting chest tightness. He is aware of the palpitations at times without exertion but only gets shortness of breath and chest tightness with exertion. He occasionally notices notices pedal edema. His exertion level has been significantly limited since the first episode of palpitations. With the recent upper respiratory illness, he has not had fevers or chills. He still coughs occasionally. After seeing Dr. Alwyn Ren on 4/1, he quit taking Nyquil.    Past Medical History  Diagnosis Date  . Other peripheral vascular disease     bilateral lower extremity  . Hyperlipidemia   . Hypertension   . Transient global amnesia   . Contact dermatitis and other eczema due to plants (except food)   . Tobacco use disorder     nondependent  . Atrial flutter     onset  2011  . Arrhythmia     tachycardia  . Type II diabetes  mellitus   . Chronic bronchitis   . GERD (gastroesophageal reflux disease)      Surgical History:  Past Surgical History  Procedure Date  . Partial hip arthroplasty 01/2000    "Right; replaced ball & stem"  . Partial hip arthroplasty 01/2000    Right  . Cystoscopy 11/2007    Dr Annabell Howells  . Joint replacement   . Cardiac electrophysiology mapping and ablation 03/2010     I have reviewed the patient's current medications. Medication Sig  . acitretin (SORIATANE) 25 MG capsule Take 25 mg by mouth daily before breakfast.  . ADVAIR DISKUS 100-50 MCG/DOSE AEPB inhale 1 dose by mouth twice a day  . ammonium lactate (AMLACTIN) 12 % cream Apply topically as needed.  [name] aspirin 325 MG tablet Take 325 mg by mouth daily.    . Calcitriol (VECTICAL) 3 MCG/GM cream Apply topically at bedtime.  . cilostazol (PLETAL) 100 MG tablet take 1 tablet by mouth twice a day  . CRESTOR 20 MG tablet take 1 tablet by mouth once daily  . Flaxseed, Linseed, (FLAX SEED OIL) 1000 MG CAPS Take 1 capsule by mouth daily.    . FORTAMET 1000 MG 24 hr tablet take 1 tablet by mouth once daily  . halobetasol (ULTRAVATE) 0.05 % cream Apply topically 2 (two) times daily.  [name] HYDROcodone-homatropine (HYDROMET) 5-1.5 MG/5ML syrup Take 5 mLs by mouth every 6 (six) hours as needed for cough.  [name] ketoconazole (NIZORAL) 2 % cream Apply topically 2 (two) times daily.    [name]  losartan (COZAAR) 100 MG tablet take 1 tablet by mouth once daily  . metoprolol (LOPRESSOR) 50 MG tablet take 1 tablet by mouth twice a day  . Multiple Vitamins-Minerals (CENTRUM PO) Take by mouth daily.    . mupirocin (BACTROBAN) 2 % ointment Apply to affected area 2 times daily  . Omega-3 Fatty Acids (FISH OIL) 1000 MG CAPS Take 2 capsules by mouth daily.    . rosuvastatin (CRESTOR) 20 MG tablet Take 1 tablet (20 mg total) by mouth daily.  [name] sulfamethoxazole-trimethoprim (BACTRIM DS,SEPTRA DS) 800-160 MG per tablet Take 1 tablet by mouth 2 (two) times daily.  [name]  triamcinolone cream (KENALOG) 0.1 % Apply topically 2 (two) times daily.    Allergies:  Allergies  Allergen Reactions  . Niacin Other (See Comments)    REACTION: upset stomach    History   Social History  . Marital Status: Single    Spouse Name: N/A    Number of Children: N/A  . Years of Education: N/A   Occupational History  . security guard    Social History Main Topics  . Smoking status: Current Everyday Smoker -- 1.5 packs/day for 44 years    Types: Cigarettes  . Smokeless tobacco: Never Used  . Alcohol Use: 0.0 oz/week     12/23/11 "2-3 drinks per year  . Drug Use: No  . Sexually Active: Not Currently   Family History  Problem Relation Age of Onset  . Stroke Mother 81  . Hypertension Mother   . Diabetes Mother   . Lung cancer Father   . Diabetes Paternal Grandmother   . Diabetes Brother     MI @ 37  . Diabetes Brother     Hepatitis C  . Throat cancer Paternal Uncle     1/2 uncle  . Heart attack Brother 67    Review of Systems: He has had no fevers or chills. The cough is nonproductive. He denies orthopnea or PND. Prior to the onset of palpitations, he did not have exertional chest tightness. He is not having any ongoing GI symptoms or problems. Full 14-point review of systems otherwise negative except as noted above.   Physical Exam:   General: Well developed, well nourished, male in no acute distress. Head: Normocephalic, atraumatic, sclera non-icteric, no xanthomas, nares are without discharge. Dentition: good Neck: No carotid bruits. JVD not elevated but difficult to assess secondary to body habitus. No thyromegally Lungs: Good expansion bilaterally, Rales are noted in the bases but no crackles or wheeze. Heart: Rapid and slightly irregular rate and rhythm with S1 S2. No S3 or S4.  No murmurs, no rubs, or gallops appreciated. Abdomen: Soft, non-tender, non-distended with normoactive bowel sounds. No hepatomegaly. No rebound/guarding. No obvious abdominal  masses. Msk:  Strength and tone appear normal for age. No joint deformities or effusions, no spine or costo-vertebral angle tenderness. Extremities: No clubbing or cyanosis. 1+ edema in the lower extremities, right slightly greater than left.  Distal pedal pulses are 2+ in both radials and 1+ in both lower extremities. Neuro: Alert and oriented X 3. Moves all extremities spontaneously. No focal deficits noted. Psych:  Responds to questions appropriately with a normal affect. Skin: No rashes or lesions noted  Labs: Pending  Radiology/Studies:  pending   Echo: 04/02/2010 Study Conclusions - Left ventricle: The cavity size was normal. Wall thickness was normal. Systolic function was borderline. The estimated ejection fraction was in the range of 50% to 55%. - Aortic valve: There was no stenosis.  Trivial regurgitation. - Aorta: Normal caliber aorta with mild descending thoracic aorta plaque. - Mitral valve: Trivial regurgitation. - Left atrium: The atrium was mildly to moderately dilated. No evidence of thrombus in the atrial cavity or appendage. - Right ventricle: The cavity size was normal. Systolic function was normal. - Right atrium: The atrium was mildly dilated. - Atrial septum: Small PFO with a few bubbles crossing with Valsalva. - Tricuspid valve: Peak RV-RA gradient: 28mm Hg (S). Impressions: - The patient was in rapid atrial flutter during this study. No LA thrombus so may proceed with atrial flutter ablation.  EKG: From Dr Alwyn Ren, 4/3  Atrial flutter, rate 120  -With rate variation  PRi = 100  cv = 12. -RSR(V1) -nondiagnostic.   -Old anterior infarct.   -Diffuse ST depression  -Nondiagnostic -possible digitalis effect, -consider subendocardial injury/ischemia.    ASSESSMENT AND PLAN:  Principal Problem:  *Atrial flutter - Will increase metoprolol to 75 mg tid for better rate control  Active Problems:  DIABETES MELLITUS, TYPE II - continue glucophage, ck A1c    Unspecified essential hypertension - watch on increased BB   CHF - hx and has some LE edema but no other signs of overload. DOE may be from arrhythmia, ck CXR/ BNP   Anticoag - heparin and ASA for now, decide on other Rx once more definite plan in place.  Chest pain - cycle enzymes, ck echo, decide on further eval once data reviewed. Cont ASA/BB/statin   Signed,  Theodore Demark PA-C 12/23/2011, 4:22 PM Patient examined, history reviewed. Patient noted tachypalpitations about 6 weeks ago and has been intermittently aware of them with exertion since then.  Not on anticoagulation.  Past history of successful ablation of flutter in July 2011 by Dr. Graciela Husbands. Exam reveals patient in no acute distress. Cardiac exam unremarkable except for rapid irregular rate.  Lungs reveal rhonchi consistent with his recent history of bronchitis and long history of smoking.  Extremities reveal changes of stasis dermatitis and mild pedal edema which is chronic. Plan as noted above. Begin IV heparin now.  He will need effective anticoagulation for 3-4 weeks before attempt at restoration of NSR.  For rate control will add IV cardizem. Consider EP consult to consider repeat ablation in the future. Will also work on smoking cessation.

## 2011-12-24 ENCOUNTER — Other Ambulatory Visit: Payer: Self-pay

## 2011-12-24 DIAGNOSIS — I059 Rheumatic mitral valve disease, unspecified: Secondary | ICD-10-CM

## 2011-12-24 LAB — CBC
HCT: 43.9 % (ref 39.0–52.0)
Hemoglobin: 15 g/dL (ref 13.0–17.0)
MCHC: 34.2 g/dL (ref 30.0–36.0)
WBC: 8 10*3/uL (ref 4.0–10.5)

## 2011-12-24 LAB — GLUCOSE, CAPILLARY
Glucose-Capillary: 125 mg/dL — ABNORMAL HIGH (ref 70–99)
Glucose-Capillary: 109 mg/dL — ABNORMAL HIGH (ref 70–99)
Glucose-Capillary: 90 mg/dL (ref 70–99)

## 2011-12-24 LAB — CARDIAC PANEL(CRET KIN+CKTOT+MB+TROPI): Relative Index: 0.7 (ref 0.0–2.5)

## 2011-12-24 MED ORDER — WARFARIN VIDEO
Freq: Once | Status: AC
  Administered 2011-12-25: 18:00:00

## 2011-12-24 MED ORDER — GUAIFENESIN ER 600 MG PO TB12
600.0000 mg | ORAL_TABLET | Freq: Two times a day (BID) | ORAL | Status: DC
  Administered 2011-12-24 – 2011-12-29 (×10): 600 mg via ORAL
  Filled 2011-12-24 (×12): qty 1

## 2011-12-24 MED ORDER — WARFARIN SODIUM 7.5 MG PO TABS
7.5000 mg | ORAL_TABLET | Freq: Once | ORAL | Status: AC
  Administered 2011-12-24: 7.5 mg via ORAL
  Filled 2011-12-24: qty 1

## 2011-12-24 MED ORDER — WARFARIN - PHARMACIST DOSING INPATIENT: Freq: Every day | Status: DC

## 2011-12-24 MED ORDER — CEFUROXIME AXETIL 500 MG PO TABS
500.0000 mg | ORAL_TABLET | Freq: Two times a day (BID) | ORAL | Status: DC
  Administered 2011-12-24 – 2011-12-29 (×9): 500 mg via ORAL
  Filled 2011-12-24 (×12): qty 1

## 2011-12-24 MED ORDER — DILTIAZEM HCL 60 MG PO TABS
60.0000 mg | ORAL_TABLET | Freq: Four times a day (QID) | ORAL | Status: DC
  Administered 2011-12-24 – 2011-12-25 (×5): 60 mg via ORAL
  Filled 2011-12-24 (×9): qty 1

## 2011-12-24 MED ORDER — PATIENT'S GUIDE TO USING COUMADIN BOOK
Freq: Once | Status: AC
  Administered 2011-12-25: 18:00:00
  Filled 2011-12-24: qty 1

## 2011-12-24 NOTE — Progress Notes (Signed)
ANTICOAGULATION CONSULT NOTE - Follow Up Consult  Pharmacy Consult for heparin Indication: aflutter  Allergies  Allergen Reactions  . Niacin Other (See Comments)    REACTION: upset stomach    Patient Measurements: Height: 5\' 11"  (180.3 cm) Weight: 262 lb 5.6 oz (119 kg) IBW/kg (Calculated) : 75.3  Heparin Dosing Weight: 101.5kg  Vital Signs: BP: 126/89 mmHg (04/04 0100)  Labs:  Basename 12/24/11 0910 12/24/11 0057 12/23/11 2233 12/23/11 1801  HGB 15.0 -- -- 15.0  HCT 43.9 -- -- 44.5  PLT 181 -- -- 181  APTT -- -- -- 33  LABPROT -- -- -- 12.8  INR -- -- -- 0.94  HEPARINUNFRC 0.28* 0.42 -- --  CREATININE -- -- -- 1.38*  CKTOTAL -- -- 316* 323*  CKMB -- -- 2.5 2.7  TROPONINI -- -- <0.30 <0.30   Estimated Creatinine Clearance: 73.8 ml/min (by C-G formula based on Cr of 1.38).   Medications:  Scheduled:    . acitretin  25 mg Oral QAC breakfast  . aspirin  325 mg Oral Daily  . atorvastatin  40 mg Oral q1800  . Calcitriol  1 application Topical QHS  . cilostazol  100 mg Oral BID  . diltiazem  10 mg Intravenous Once  . diltiazem  60 mg Oral Q6H  . Fluticasone-Salmeterol  1 puff Inhalation BID  . guaiFENesin  600 mg Oral BID  . halobetasol   Topical BID  . heparin  4,000 Units Intravenous Once  . ketoconazole   Topical BID  . losartan  100 mg Oral Daily  . metformin  1,000 mg Oral Q breakfast  . metoprolol tartrate  75 mg Oral BID  . mulitivitamin with minerals  1 tablet Oral Daily  . omega-3 acid ethyl esters  1 g Oral Daily  . pneumococcal 23 valent vaccine  0.5 mL Intramuscular Tomorrow-1000  . sulfamethoxazole-trimethoprim  1 tablet Oral Q12H  . DISCONTD: CENTRUM  1 tablet Oral Daily  . DISCONTD: Flax Seed Oil  1 capsule Oral Daily  . DISCONTD: halobetasol   Topical BID  . DISCONTD: metoprolol  50 mg Oral TID  . DISCONTD: metoprolol tartrate  75 mg Oral TID  . DISCONTD: sulfamethoxazole-trimethoprim  1 tablet Oral BID    Assessment: 60 YOM with aflutter  and RVR. Patient started of heparin gtt. Heparin level recheck reveals level slightly subtherapeutic.  CBC is stable and no bleeding is evident. Orders to start warfarin, patient noted to be on bactrim (continued from outpatient) for ? Sinusitis.   Goal of Therapy:  Heparin level 0.3-0.7 units/ml   Plan:  1. Increase heparin rate to 1450 units/hr 2. Check heparin level, INR and CBC in am 3. Coumadin 7.5mg  tonight and orders to change bactrim to ceftin  Suzette Battiest, Tad Moore 12/24/2011,10:51 AM

## 2011-12-24 NOTE — Progress Notes (Signed)
UR Completed. Simmons, Gurveer Colucci F [phone]  

## 2011-12-24 NOTE — Consult Note (Signed)
Pt smokes 2 ppd and is in action stage. Recommended to start with 42 mg patch. Discussed patch use instructions and how to taper. Pt verbalizes understanding. Referred to 1-800 quit now for f/u and support. Discussed oral fixation substitutes, second hand smoke and in home smoking policy. Reviewed and gave pt Written education/contact information.

## 2011-12-24 NOTE — Progress Notes (Signed)
ANTICOAGULATION CONSULT NOTE - Initial Consult  Pharmacy Consult for heparin Indication: a flutter  Allergies  Allergen Reactions  . Niacin Other (See Comments)    REACTION: upset stomach    Patient Measurements: Height: 5\' 11"  (180.3 cm) Weight: 262 lb 5.6 oz (119 kg) IBW/kg (Calculated) : 75.3  Heparin Dosing Weight: ~90 kg  Vital Signs: Temp: 98.4 F (36.9 C) (04/03 2052) Temp src: Oral (04/03 2052) BP: 149/93 mmHg (04/03 1858) Pulse Rate: 106  (04/03 2052)  Labs:  Basename 12/24/11 0057 12/23/11 2233 12/23/11 1801  HGB -- -- 15.0  HCT -- -- 44.5  PLT -- -- 181  APTT -- -- 33  LABPROT -- -- 12.8  INR -- -- 0.94  HEPARINUNFRC 0.42 -- --  CREATININE -- -- 1.38*  CKTOTAL -- 316* 323*  CKMB -- 2.5 2.7  TROPONINI -- <0.30 <0.30   Estimated Creatinine Clearance: 73.8 ml/min (by C-G formula based on Cr of 1.38).  Assessment: 61 yo man with atrial flutter for Heparin  Goal of Therapy:  Heparin level 0.3-0.7 units/ml   Plan:  Continue Heparin at current rate F/U AM Labs  Breannah Kratt, Gary Fleet 12/24/2011,1:48 AM

## 2011-12-24 NOTE — Progress Notes (Signed)
Subjective:  Remains in atrial flutter with rapid VR.  Not aware of any palpitations or chest pain. 2D echo pending  Objective:  Vital Signs in the last 24 hours: Temp:  [98.2 F (36.8 C)-98.8 F (37.1 C)] 98.4 F (36.9 C) (04/03 2052) Pulse Rate:  [106-122] 106  (04/03 2052) Resp:  [22] 22  (04/03 2052) BP: (126-149)/(89-93) 126/89 mmHg (04/04 0100) SpO2:  [95 %] 95 % (04/03 1858) Weight:  [119 kg (262 lb 5.6 oz)-119.659 kg (263 lb 12.8 oz)] 119 kg (262 lb 5.6 oz) (04/03 1858)  Intake/Output from previous day: 04/03 0701 - 04/04 0700 In: 981.7 [P.O.:720; I.V.:261.7] Out: 1600 [Urine:1600] Intake/Output from this shift:       . acitretin  25 mg Oral QAC breakfast  . aspirin  325 mg Oral Daily  . atorvastatin  40 mg Oral q1800  . Calcitriol  1 application Topical QHS  . cilostazol  100 mg Oral BID  . diltiazem  10 mg Intravenous Once  . Fluticasone-Salmeterol  1 puff Inhalation BID  . halobetasol   Topical BID  . heparin  4,000 Units Intravenous Once  . ketoconazole   Topical BID  . losartan  100 mg Oral Daily  . metformin  1,000 mg Oral Q breakfast  . metoprolol tartrate  75 mg Oral BID  . mulitivitamin with minerals  1 tablet Oral Daily  . omega-3 acid ethyl esters  1 g Oral Daily  . pneumococcal 23 valent vaccine  0.5 mL Intramuscular Tomorrow-1000  . sulfamethoxazole-trimethoprim  1 tablet Oral Q12H  . DISCONTD: CENTRUM  1 tablet Oral Daily  . DISCONTD: Flax Seed Oil  1 capsule Oral Daily  . DISCONTD: halobetasol   Topical BID  . DISCONTD: metoprolol  50 mg Oral TID  . DISCONTD: metoprolol tartrate  75 mg Oral TID  . DISCONTD: sulfamethoxazole-trimethoprim  1 tablet Oral BID      . diltiazem (CARDIZEM) infusion 10 mg/hr (12/24/11 0342)  . heparin 1,300 Units/hr (12/23/11 1955)    Physical Exam: The patient appears to be in no distress.  Head and neck exam reveals that the pupils are equal and reactive.  The extraocular movements are full.  There is no  scleral icterus.  Mouth and pharynx are benign.  No lymphadenopathy.  No carotid bruits.  The jugular venous pressure is normal.  Thyroid is not enlarged or tender.  Chest reveals rhonchi Heart reveals no abnormal lift or heave.  First and second heart sounds are normal.  There is no murmur gallop rub or click. Rhythm is irregular Extremities: trace edema  Neurologic exam is normal strength and no lateralizing weakness.  No sensory deficits.  Integument reveals no rash  Lab Results:  [facility] 12/23/11 1801  WBC 9.8  HGB 15.0  PLT 181    Basename 12/23/11 1801  NA 138  K 4.0  CL 104  CO2 22  GLUCOSE 142*  BUN 16  CREATININE 1.38*    Basename 12/23/11 2233 12/23/11 1801  TROPONINI <0.30 <0.30   Hepatic Function Panel  Basename 12/23/11 1801  PROT 7.6  ALBUMIN 3.9  AST 16  ALT 17  ALKPHOS 61  BILITOT 0.2*  BILIDIR --  IBILI --   No results found for this basename: CHOL in the last 72 hours No results found for this basename: PROTIME in the last 72 hours  Imaging: X-ray Chest Pa And Lateral  12/23/2011  *RADIOLOGY REPORT*  Clinical Data: Palpitations.  Upper respiratory infection.  CHEST -  2 VIEW  Comparison: The seventh 15-1011.  Findings: The heart is normal in size and stable.  The mediastinal and hilar contours are unchanged.  There are emphysematous changes and areas of pulmonary scarring.  Suspect right upper lobe bronchiectasis.  No acute overlying pulmonary process.  No pleural effusion.  The bony thorax is intact.  IMPRESSION:  1.  Emphysematous changes and pulmonary scarring with probable right upper lobe bronchiectasis. 2.  No acute overlying pulmonary process.  Original Report Authenticated By: P. Loralie Champagne, M.D.    Cardiac Studies:  Assessment/Plan:  Patient Active Hospital Problem List: Atrial flutter (03/18/2010)   Assessment: Rate control   Plan: Start warfarin.  Later outpatient visit with Dr. Graciela Husbands to discuss another ablation attempt. Switch  to oral cardizem 60 mg q6h.  Bronchitis    Chest xray okay.    Add Mucinex    LOS: 1 day    Cassell Clement 12/24/2011, 7:35 AM

## 2011-12-24 NOTE — Progress Notes (Signed)
  Echocardiogram 2D Echocardiogram has been performed.  Neil Carroll 12/24/2011, 11:30 AM

## 2011-12-24 NOTE — Progress Notes (Signed)
Clinical Social Work Department BRIEF PSYCHOSOCIAL ASSESSMENT 12/24/2011  Patient:  Neil Carroll, Neil Carroll     Account Number:  [id number]     Admit date:  12/23/2011  Clinical Social Worker:  Hulan Fray  Date/Time:  12/24/2011 10:23 AM  Referred by:  RN  Date Referred:  12/23/2011 Referred for  Advanced Directives   Other Referral:   Interview type:  Patient Other interview type:    PSYCHOSOCIAL DATA Living Status:  OTHER Admitted from facility:   Level of care:   Primary support name:  Lestine Box Primary support relationship to patient:  FAMILY Degree of support available:   supportive. Patient's ex wife lives in the same house.    CURRENT CONCERNS Current Concerns  Other - See comment   Other Concerns:   Patient requested advance directive documents    SOCIAL WORK ASSESSMENT / PLAN Clinical Social Worker received referral for advance directive request. CSW provided patient with advance directive packet and MOST form to be filled out with his physician. CSW answered patient's questions regarding the documents. Patient stated that he had knowledge of the advance directive documents. Patient recalled his experience with his brother who was in a coma and how he was making the medical decisions. Patient stated that he wished he was not the one that had that responsibility. CSW informed patient of places that can notarize the form outside the hospital if they are not notarized during this hospital admission. CSW will sign off as social work intervention is no longer needed.   Assessment/plan status:  No Further Intervention Required Other assessment/ plan:   Information/referral to community resources:   Engineer, production and MOST form.    PATIENT'S/FAMILY'S RESPONSE TO PLAN OF CARE: Patient was agreeable to receive the advance directive packet. Patient was appreciative of CSW's assistance.

## 2011-12-25 LAB — GLUCOSE, CAPILLARY
Glucose-Capillary: 131 mg/dL — ABNORMAL HIGH (ref 70–99)
Glucose-Capillary: 102 mg/dL — ABNORMAL HIGH (ref 70–99)
Glucose-Capillary: 111 mg/dL — ABNORMAL HIGH (ref 70–99)

## 2011-12-25 LAB — CBC
MCH: 28.8 pg (ref 26.0–34.0)
MCHC: 33.7 g/dL (ref 30.0–36.0)
Platelets: 191 10*3/uL (ref 150–400)

## 2011-12-25 LAB — HEPARIN LEVEL (UNFRACTIONATED): Heparin Unfractionated: 0.47 IU/mL (ref 0.30–0.70)

## 2011-12-25 MED ORDER — DILTIAZEM HCL 100 MG IV SOLR
10.0000 mg/h | INTRAVENOUS | Status: DC
  Filled 2011-12-25: qty 100

## 2011-12-25 MED ORDER — DILTIAZEM LOAD VIA INFUSION
10.0000 mg | Freq: Once | INTRAVENOUS | Status: DC
  Filled 2011-12-25: qty 10

## 2011-12-25 MED ORDER — DILTIAZEM HCL ER COATED BEADS 240 MG PO CP24
240.0000 mg | ORAL_CAPSULE | Freq: Every day | ORAL | Status: DC
Start: 2011-12-25 — End: 2011-12-25

## 2011-12-25 MED ORDER — METOPROLOL TARTRATE 100 MG PO TABS
100.0000 mg | ORAL_TABLET | Freq: Two times a day (BID) | ORAL | Status: DC
  Administered 2011-12-25 – 2011-12-29 (×8): 100 mg via ORAL
  Filled 2011-12-25 (×10): qty 1

## 2011-12-25 MED ORDER — WARFARIN SODIUM 10 MG PO TABS
10.0000 mg | ORAL_TABLET | Freq: Once | ORAL | Status: AC
  Administered 2011-12-25: 10 mg via ORAL
  Filled 2011-12-25: qty 1

## 2011-12-25 MED ORDER — DILTIAZEM HCL 100 MG IV SOLR
15.0000 mg/h | INTRAVENOUS | Status: DC
  Administered 2011-12-25 – 2011-12-26 (×3): 10 mg/h via INTRAVENOUS
  Administered 2011-12-26 – 2011-12-28 (×7): 15 mg/h via INTRAVENOUS
  Filled 2011-12-25 (×9): qty 100

## 2011-12-25 MED ORDER — DILTIAZEM LOAD VIA INFUSION
10.0000 mg | Freq: Once | INTRAVENOUS | Status: AC
  Administered 2011-12-25: 10 mg via INTRAVENOUS
  Filled 2011-12-25: qty 10

## 2011-12-25 NOTE — Progress Notes (Signed)
Patient Name: Neil Carroll Date of Encounter: 12/25/2011     Principal Problem:  *Atrial flutter Active Problems:  DIABETES MELLITUS, TYPE II  Unspecified essential hypertension  CHF  Chest pain    SUBJECTIVE: In good spirits. No complaints overnight. Specifically denies chest pain, palpitations, sob, lightheadedness. Cough productive of yellow sputum witnessed.    OBJECTIVE  Filed Vitals:   12/24/11 2046 12/24/11 2129 12/25/11 0507 12/25/11 0529  BP:  141/94 150/91   Pulse:  113 123 110  Temp:  98.2 F (36.8 C) 98.4 F (36.9 C)   TempSrc:  Oral Oral   Resp:  20 19   Height:      Weight:   116.1 kg (255 lb 15.3 oz)   SpO2: 95% 94% 90%     Intake/Output Summary (Last 24 hours) at 12/25/11 4782 Last data filed at 12/25/11 0500  Gross per 24 hour  Intake 2655.3 ml  Output   1600 ml  Net 1055.3 ml   Weight change: -2.9 kg (-6 lb 6.3 oz)  PHYSICAL EXAM  General: Well developed, well nourished, in no acute distress. Head: Normocephalic, atraumatic, sclera non-icteric, no xanthomas  Neck: Supple without bruits or JVD. Lungs:  Cough on deep inspiration. No increased respiratory effort. Central>peripheral rhonchi noted, cleared with cough. No wheezes or rales appreciated. Heart: Irregular, tachycardic, no s3, s4, or murmurs. Abdomen: Soft, non-tender, non-distended, BS + x 4.  Msk:  Strength and tone appears normal for age. Extremities: No clubbing, cyanosis, trace nonpitting bilateral LE edema. DP/PT/Radials 2+ and equal bilaterally. Neuro: Alert and oriented X 3. Moves all extremities spontaneously. Psych: Normal affect.  LABS:  Recent Labs  [facility] 12/24/11 0910 12/23/11 1801   WBC 8.0 9.8   HGB 15.0 15.0   HCT 43.9 44.5   MCV 85.9 85.6   PLT 181 181    Lab 12/23/11 1801  NA 138  K 4.0  CL 104  CO2 22  BUN 16  CREATININE 1.38*  CALCIUM 9.2  PROT 7.6  BILITOT 0.2*  ALKPHOS 61  ALT 17  AST 16  AMYLASE --  LIPASE --  GLUCOSE 142*    Recent Labs  Basename 12/23/11 1801   HGBA1C 6.8*   Recent Labs  Basename 12/24/11 0910 12/23/11 2233 12/23/11 1801   CKTOTAL 335* 316* 323*   CKMB 2.3 2.5 2.7   CKMBINDEX -- -- --   TROPONINI <0.30 <0.30 <0.30    Basename 12/23/11 1801  TSH 0.643  T4TOTAL --  T3FREE --  THYROIDAB --   TELE: atrial flutter, 2:1, 3:1 noted. 110-40 bpm. No ST changes.   Radiology/Studies:  X-ray Chest Pa And Lateral  12/23/2011  *RADIOLOGY REPORT*  Clinical Data: Palpitations.  Upper respiratory infection.  CHEST - 2 VIEW  Comparison: The seventh 15-1011.  Findings: The heart is normal in size and stable.  The mediastinal and hilar contours are unchanged.  There are emphysematous changes and areas of pulmonary scarring.  Suspect right upper lobe bronchiectasis.  No acute overlying pulmonary process.  No pleural effusion.  The bony thorax is intact.  IMPRESSION:  1.  Emphysematous changes and pulmonary scarring with probable right upper lobe bronchiectasis. 2.  No acute overlying pulmonary process.  Original Report Authenticated By: P. Loralie Champagne, M.D.   2D echocardiogram with contast- 12/24/11  Study Conclusions  - Left ventricle: Wall thickness was increased in a pattern of mild LVH. Systolic function was mildly reduced. The estimated ejection fraction was in the range of  45% to 50%. - Aortic valve: Trivial regurgitation. - Mitral valve: Mild regurgitation. - Left atrium: The atrium was mildly dilated. - Pulmonary arteries: PA peak pressure: 35mm Hg (S). Transthoracic echocardiography. M-mode, complete 2D, spectral Doppler, and color Doppler. Height: Height: 180.3cm. Height: 71in. Weight: Weight: 118.8kg. Weight: 261.5lb. Body mass index: BMI: 36.5kg/m^2. Body surface area: BSA: 2.92m^2. Blood pressure: 126/89. Patient status: Inpatient. Location:  Bedside.  ------------------------------------------------------------  ------------------------------------------------------------ Left ventricle: Wall thickness was increased in a pattern of mild LVH. Systolic function was mildly reduced. The estimated ejection fraction was in the range of 45% to 50%.  ------------------------------------------------------------ Aortic valve: Structurally normal valve. Cusp separation was normal. Doppler: Transvalvular velocity was within the normal range. There was no stenosis. Trivial regurgitation.  ------------------------------------------------------------ Mitral valve: Structurally normal valve. Leaflet separation was normal. Doppler: Transvalvular velocity was within the normal range. There was no evidence for stenosis. Mild regurgitation.  ------------------------------------------------------------ Left atrium: The atrium was mildly dilated.  ------------------------------------------------------------ Right ventricle: The cavity size was normal. Wall thickness was normal. Systolic function was normal.  ------------------------------------------------------------ Pulmonic valve: Doppler: No significant regurgitation.  ------------------------------------------------------------ Tricuspid valve: Doppler: Mild regurgitation.  ------------------------------------------------------------ Right atrium: The atrium was normal in size.  ------------------------------------------------------------ Pericardium: There was no pericardial effusion.  ------------------------------------------------------------ Systemic veins: Inferior vena cava: The vessel was normal in size; the respirophasic diameter changes were in the normal range (= 50%); findings are consistent with normal central venous pressure.  ------------------------------------------------------------  2D measurements Normal Doppler measurements Normal Left ventricle Main  pulmonary LVID ED, 45.7 mm 43-52 artery chord, Pressure, S 35 mm =30 PLAX Hg LVID ES, 35.5 mm 23-38 Aortic valve chord, Regurg PHT 252 ms ------ PLAX Tricuspid valve FS, chord, 22 % >29 Regurg peak 249 cm/s ------ PLAX vel LVPW, ED 12.8 mm ------ Peak RV-RA 25 mm ------ IVS/LVPW 1.25 <1.3 gradient, S Hg ratio, ED Max regurg 249 cm/s ------ Ventricular septum vel IVS, ED 16 mm ------ Systemic veins Aorta Estimated 10 mm ------ Root diam, 35 mm ------ CVP Hg ED Right ventricle Left atrium Pressure, S 35 mm <30 AP dim 45 mm ------ Hg AP dim 1.9 cm/m^2 <2.2 index Vol, S 68 ml ------ Vol index, 28.7 ml/m^2 ------ S Right ventricle RVID ED, 32.5 mm 19-38 PLAX    Current Medications:     . acitretin  25 mg Oral QAC breakfast  . aspirin  325 mg Oral Daily  . atorvastatin  40 mg Oral q1800  . Calcitriol  1 application Topical QHS  . cefUROXime  500 mg Oral BID WC  . cilostazol  100 mg Oral BID  . diltiazem  60 mg Oral Q6H  . Fluticasone-Salmeterol  1 puff Inhalation BID  . guaiFENesin  600 mg Oral BID  . halobetasol   Topical BID  . ketoconazole   Topical BID  . losartan  100 mg Oral Daily  . metformin  1,000 mg Oral Q breakfast  . metoprolol tartrate  75 mg Oral BID  . mulitivitamin with minerals  1 tablet Oral Daily  . omega-3 acid ethyl esters  1 g Oral Daily  . patient's guide to using coumadin book   Does not apply Once  . pneumococcal 23 valent vaccine  0.5 mL Intramuscular Tomorrow-1000  . warfarin  7.5 mg Oral ONCE-1800  . warfarin   Does not apply Once  . Warfarin - Pharmacist Dosing Inpatient   Does not apply q1800  . DISCONTD: sulfamethoxazole-trimethoprim  1 tablet Oral Q12H    ASSESSMENT AND PLAN:  Mr. Sada is a 61yo AA male with PMHx significant for atrial flutter (recurrent s/p atrial flutter ablation in 07/11), PVD, type 2 DM, HTN, HL, GERD and chronic bronchitis who was admitted to [facility] hospital on 12/23/11 for atrial flutter with rapid rates.   1.  Atrial flutter- persistent with rate variation from 110-140s. He was in NSR and appropriately off anticoagulation on follow-up earlier this month. Patient asymptomatic with this. 2D echo noted above revealed decreased EF at 45-50% from prior study in 2011, mild LVH, mild LA dilation.  Plan is to anticoagulate 3-4 weeks after admission date prior to elective DCCV for attempted restoration to NSR. Cardizem PO and Metoprolol PO for rate-control. Will need later outpatient EP follow-up for consideration of repeat ablation; however, with ongoing tobacco abuse and COPD, tobacco cessation would need to be addressed and committed to first.   - Continue Coumadin  - May have room to increase Lopressor for further rate and BP control, also in the setting of decreased EF  - Switch Cardizem PO to IV given persistent rapid rates  - EP follow-up appointment on discharge   2. Chronic systolic CHF- asymptomatic with this. Euvolemic on exam aside from chronic bilateral nonpitting LE edema. 2D echo ordered yesterday reveals decreased EF from prior study in 2011. Cardiac enzymes trended without elevations in CK-MB or TnI this admission. EKG without acute ischemic changes. ? Tachycardia-mediated, HTN. He certainly has CAD risk factors and may benefit from stress testing.   - Continue ARB, BB  - Will plan for outpatient stress Myoview   3. COPD- mild exacerbation. Acute flares are potentiated by continued tobacco abuse. Patient coughing yellow-tinged sputum. Central rhonchi noted on exam. No wheezes or rales appreciated. Afebrile without a leukocytosis. O2 sat's low-mid 90s.   - Continue antibiotics, Mucinex and Advair  4. Hypertension- BP mildly elevated this AM  - Continue ARB, BB, CCB  5. Hyperlipidemia  - Continue statin  6. Tobacco abuse- underwent cessation counseling. Nicotine 42mg  patch taper recommended.  7. PVD- stable. Recently followed-up with Dr. Excell Seltzer in the office for this. Smoking cessation  essential.   - Continue ASA, cilstazol  Signed, R. Hurman Horn, PA-C 12/25/2011, 6:32 AM  I have personally seen and examined this patient with Hurman Horn, PA-C. All available data has been reviewed.  I agree with the assessment and plan as outlined above. Atrial flutter with uncontrolled rate. Rate poorly controlled with po Diltiazem. Change back to IV Cardizem, increase po Lopressor. Continue coumadin and heparin. Will need outpt stress myoview given risk factors for CAD and slight decrease in LV systolic function on echo. Will need f/u with Dr. Graciela Husbands who has performed atrial flutter ablation in past.   Jerilynn Feldmeier,Gerlad 9:37 AM 12/25/2011

## 2011-12-25 NOTE — Progress Notes (Signed)
ANTICOAGULATION CONSULT NOTE - Follow Up Consult  Pharmacy Consult for heparin Indication: aflutter  Allergies  Allergen Reactions  . Niacin Other (See Comments)    REACTION: upset stomach    Patient Measurements: Height: 5\' 11"  (180.3 cm) Weight: 255 lb 15.3 oz (116.1 kg) IBW/kg (Calculated) : 75.3  Heparin Dosing Weight: 101.5kg  Vital Signs: Temp: 98.4 F (36.9 C) (04/05 0507) Temp src: Oral (04/05 0507) BP: 150/91 mmHg (04/05 0507) Pulse Rate: 110  (04/05 0529)  Labs:  Basename 12/25/11 0602 12/24/11 0910 12/24/11 0057 12/23/11 2233 12/23/11 1801  HGB 15.1 15.0 -- -- --  HCT 44.8 43.9 -- -- 44.5  PLT 191 181 -- -- 181  APTT -- -- -- -- 33  LABPROT 13.2 -- -- -- 12.8  INR 0.98 -- -- -- 0.94  HEPARINUNFRC 0.47 0.28* 0.42 -- --  CREATININE -- -- -- -- 1.38*  CKTOTAL -- 335* -- 316* 323*  CKMB -- 2.3 -- 2.5 2.7  TROPONINI -- <0.30 -- <0.30 <0.30   Estimated Creatinine Clearance: 72.8 ml/min (by C-G formula based on Cr of 1.38).   Medications:  Scheduled:     . acitretin  25 mg Oral QAC breakfast  . aspirin  325 mg Oral Daily  . atorvastatin  40 mg Oral q1800  . Calcitriol  1 application Topical QHS  . cefUROXime  500 mg Oral BID WC  . cilostazol  100 mg Oral BID  . diltiazem  10 mg Intravenous Once  . Fluticasone-Salmeterol  1 puff Inhalation BID  . guaiFENesin  600 mg Oral BID  . halobetasol   Topical BID  . ketoconazole   Topical BID  . losartan  100 mg Oral Daily  . metformin  1,000 mg Oral Q breakfast  . metoprolol tartrate  100 mg Oral BID  . mulitivitamin with minerals  1 tablet Oral Daily  . omega-3 acid ethyl esters  1 g Oral Daily  . patient's guide to using coumadin book   Does not apply Once  . pneumococcal 23 valent vaccine  0.5 mL Intramuscular Tomorrow-1000  . warfarin  7.5 mg Oral ONCE-1800  . warfarin   Does not apply Once  . Warfarin - Pharmacist Dosing Inpatient   Does not apply q1800  . DISCONTD: diltiazem  240 mg Oral Daily  .  DISCONTD: diltiazem  10 mg Intravenous Once  . DISCONTD: diltiazem  60 mg Oral Q6H  . DISCONTD: metoprolol tartrate  75 mg Oral BID    Assessment: 60 YOM with aflutter and RVR. Patient started of heparin gtt. Heparin level therapeutic this am.  CBC is stable and no bleeding is evident. INR is subtherapeutic at 0.98.   Goal of Therapy:  Heparin level 0.3-0.7 units/ml   Plan:  1. Continue heparin at 1450 units/hr 2. Check daily heparin level, INR and CBC in am 3. Coumadin 10mg  tonight  4. Suggest change ASA to 81mg  with coumadin being added (also on pletal)  Harmani Neto, Tad Moore 12/25/2011,11:41 AM

## 2011-12-26 DIAGNOSIS — I509 Heart failure, unspecified: Secondary | ICD-10-CM

## 2011-12-26 DIAGNOSIS — I7389 Other specified peripheral vascular diseases: Secondary | ICD-10-CM

## 2011-12-26 DIAGNOSIS — J209 Acute bronchitis, unspecified: Secondary | ICD-10-CM

## 2011-12-26 LAB — GLUCOSE, CAPILLARY
Glucose-Capillary: 127 mg/dL — ABNORMAL HIGH (ref 70–99)
Glucose-Capillary: 119 mg/dL — ABNORMAL HIGH (ref 70–99)

## 2011-12-26 LAB — CBC
MCH: 28.9 pg (ref 26.0–34.0)
MCV: 87.1 fL (ref 78.0–100.0)
Platelets: 190 10*3/uL (ref 150–400)
RBC: 5.26 MIL/uL (ref 4.22–5.81)
RDW: 14.1 % (ref 11.5–15.5)

## 2011-12-26 MED ORDER — DABIGATRAN ETEXILATE MESYLATE 150 MG PO CAPS
150.0000 mg | ORAL_CAPSULE | Freq: Two times a day (BID) | ORAL | Status: DC
  Administered 2011-12-26 – 2011-12-29 (×6): 150 mg via ORAL
  Filled 2011-12-26 (×8): qty 1

## 2011-12-26 NOTE — Progress Notes (Signed)
   Subjective: Cough somewhat better. No chest pain, no major sense of palpitations. No breathlessness at rest.   Objective: Temp:  [98.1 F (36.7 C)-98.5 F (36.9 C)] 98.5 F (36.9 C) (04/06 0500) Pulse Rate:  [115-130] 130  (04/06 0500) Resp:  [18] 18  (04/06 0500) BP: (107-125)/(75-81) 107/77 mmHg (04/06 0500) SpO2:  [91 %-100 %] 95 % (04/06 0748) Weight:  [255 lb 3.2 oz (115.758 kg)] 255 lb 3.2 oz (115.758 kg) (04/06 0500)  I/O last 3 completed shifts: In: 1430.8 [P.O.:920; I.V.:510.8] Out: 1950 [Urine:1950]  Telemetry - Atrial flutter with rapid response, almost consistently greater than 100 beats per minute  Exam -   General - NAD.  Lungs - Coarse but clear, no wheezing.  Cardiac - Irregular, no S3.  Abdomen - NABS.  Extremities - No pitting.  Testing -   Lab Results  Component Value Date   WBC 8.0 12/26/2011   HGB 15.2 12/26/2011   HCT 45.8 12/26/2011   MCV 87.1 12/26/2011   PLT 190 12/26/2011    Lab Results  Component Value Date   CREATININE 1.38* 12/23/2011   BUN 16 12/23/2011   NA 138 12/23/2011   K 4.0 12/23/2011   CL 104 12/23/2011   CO2 22 12/23/2011    Lab Results  Component Value Date   CKTOTAL 335* 12/24/2011   CKMB 2.3 12/24/2011   TROPONINI <0.30 12/24/2011    Current Medications    . acitretin  25 mg Oral QAC breakfast  . aspirin  325 mg Oral Daily  . atorvastatin  40 mg Oral q1800  . Calcitriol  1 application Topical QHS  . cefUROXime  500 mg Oral BID WC  . cilostazol  100 mg Oral BID  . diltiazem  10 mg Intravenous Once  . Fluticasone-Salmeterol  1 puff Inhalation BID  . guaiFENesin  600 mg Oral BID  . halobetasol   Topical BID  . ketoconazole   Topical BID  . losartan  100 mg Oral Daily  . metformin  1,000 mg Oral Q breakfast  . metoprolol tartrate  100 mg Oral BID  . mulitivitamin with minerals  1 tablet Oral Daily  . omega-3 acid ethyl esters  1 g Oral Daily  . patient's guide to using coumadin book   Does not apply Once  . warfarin  10 mg  Oral ONCE-1800  . warfarin   Does not apply Once  . Warfarin - Pharmacist Dosing Inpatient   Does not apply q1800    Assessment:  1. Recurrent atrial flutter with RVR. Status post ablation 7/11.  2. Mild LV systolic dysfunction, LVEF 45-50% by recent echocardiogram. Consider tachycardia mediated. Ultimately plan for outpatient followup Myoview.  3. COPD with mild exacerbations. Possibly also contributing to recurrent arrhythmias.  4. Tobacco abuse.  5. PAD, followed by Dr. Excell Seltzer.  6. Hypertension.  7. Hyperlipidemia, on statin therapy.  Plan:  Patient is symptomatically stable, heart rate is not well controlled, and this will be difficult in the setting of atrial flutter. I reviewed the situation with Dr. Graciela Husbands. Plan at this point will be to switch to Pradaxa, stop Coumadin and heparin (not therapeutic at this point), and anticipated TEE guided radiofrequency flutter ablation on Monday. Will make medication adjustments in the interim. Discussed with patient, and he is in agreement.  Jonelle Sidle, M.D., F.A.C.C.

## 2011-12-26 NOTE — Progress Notes (Signed)
ANTICOAGULATION CONSULT NOTE - Follow Up Consult  Pharmacy Consult for heparin Indication: aflutter  Allergies  Allergen Reactions  . Niacin Other (See Comments)    REACTION: upset stomach    Patient Measurements: Height: 5\' 11"  (180.3 cm) Weight: 255 lb 3.2 oz (115.758 kg) IBW/kg (Calculated) : 75.3  Heparin Dosing Weight: 101.5kg  Vital Signs: Temp: 98.5 F (36.9 C) (04/06 0500) Temp src: Oral (04/06 0500) BP: 107/77 mmHg (04/06 0500) Pulse Rate: 130  (04/06 0500)  Labs:  Basename 12/26/11 0730 12/25/11 0602 12/24/11 0910 12/23/11 2233 12/23/11 1801  HGB 15.2 15.1 -- -- --  HCT 45.8 44.8 43.9 -- --  PLT 190 191 181 -- --  APTT -- -- -- -- 33  LABPROT 14.2 13.2 -- -- 12.8  INR 1.08 0.98 -- -- 0.94  HEPARINUNFRC 0.23* 0.47 0.28* -- --  CREATININE -- -- -- -- 1.38*  CKTOTAL -- -- 335* 316* 323*  CKMB -- -- 2.3 2.5 2.7  TROPONINI -- -- <0.30 <0.30 <0.30   Estimated Creatinine Clearance: 72.8 ml/min (by C-G formula based on Cr of 1.38).   Medications:  Scheduled:     . acitretin  25 mg Oral QAC breakfast  . aspirin  325 mg Oral Daily  . atorvastatin  40 mg Oral q1800  . Calcitriol  1 application Topical QHS  . cefUROXime  500 mg Oral BID WC  . cilostazol  100 mg Oral BID  . diltiazem  10 mg Intravenous Once  . Fluticasone-Salmeterol  1 puff Inhalation BID  . guaiFENesin  600 mg Oral BID  . halobetasol   Topical BID  . ketoconazole   Topical BID  . losartan  100 mg Oral Daily  . metformin  1,000 mg Oral Q breakfast  . metoprolol tartrate  100 mg Oral BID  . mulitivitamin with minerals  1 tablet Oral Daily  . omega-3 acid ethyl esters  1 g Oral Daily  . patient's guide to using coumadin book   Does not apply Once  . pneumococcal 23 valent vaccine  0.5 mL Intramuscular Tomorrow-1000  . warfarin  10 mg Oral ONCE-1800  . warfarin   Does not apply Once  . Warfarin - Pharmacist Dosing Inpatient   Does not apply q1800  . DISCONTD: diltiazem  240 mg Oral Daily    . DISCONTD: diltiazem  10 mg Intravenous Once  . DISCONTD: diltiazem  60 mg Oral Q6H  . DISCONTD: metoprolol tartrate  75 mg Oral BID    Assessment: Atrial Fibrillation:  Receiving Heparin bridge to Coumadin.  Heparin level is subtherapeutic again this AM, and INR remains subtherapeutic.  Goal of Therapy:  Heparin level 0.3-0.7 units/ml   Plan:  1. Increase Heparin to 1650 units/hr 2. Recheck Heparin level in 6 hours 3. Coumadin 10mg  tonight  4. Suggest change ASA to 81mg  with coumadin being added (also on pletal)  Eduin Friedel, Thedore Mins 12/26/2011,8:45 AM

## 2011-12-27 ENCOUNTER — Other Ambulatory Visit: Payer: Self-pay

## 2011-12-27 LAB — GLUCOSE, CAPILLARY
Glucose-Capillary: 97 mg/dL (ref 70–99)
Glucose-Capillary: 141 mg/dL — ABNORMAL HIGH (ref 70–99)

## 2011-12-27 LAB — BASIC METABOLIC PANEL
BUN: 13 mg/dL (ref 6–23)
CO2: 22 mEq/L (ref 19–32)
Chloride: 104 mEq/L (ref 96–112)
Creatinine, Ser: 1.24 mg/dL (ref 0.50–1.35)
Glucose, Bld: 118 mg/dL — ABNORMAL HIGH (ref 70–99)

## 2011-12-27 MED ORDER — SODIUM CHLORIDE 0.9 % IV SOLN: 250.0000 mL | INTRAVENOUS | Status: DC | PRN

## 2011-12-27 MED ORDER — BENZOCAINE 20 % MT SOLN
1.0000 "application " | OROMUCOSAL | Status: DC | PRN
  Filled 2011-12-27: qty 57

## 2011-12-27 MED ORDER — SODIUM CHLORIDE 0.9 % IJ SOLN: 3.0000 mL | INTRAMUSCULAR | Status: DC | PRN

## 2011-12-27 MED ORDER — SODIUM CHLORIDE 0.9 % IJ SOLN: 3.0000 mL | Freq: Two times a day (BID) | INTRAMUSCULAR | Status: DC

## 2011-12-27 MED ORDER — MIDAZOLAM HCL 10 MG/2ML IJ SOLN: 10.0000 mg | Freq: Once | INTRAMUSCULAR | Status: DC

## 2011-12-27 MED ORDER — ACITRETIN 25 MG PO CAPS: 25.0000 mg | ORAL_CAPSULE | Freq: Every day | ORAL | Status: DC

## 2011-12-27 MED ORDER — SODIUM CHLORIDE 0.45 % IV SOLN
INTRAVENOUS | Status: DC
  Administered 2011-12-27: via INTRAVENOUS

## 2011-12-27 MED ORDER — OFF THE BEAT BOOK
Freq: Once | Status: AC
  Administered 2011-12-27: 01:00:00
  Filled 2011-12-27: qty 1

## 2011-12-27 MED ORDER — FENTANYL CITRATE 0.05 MG/ML IJ SOLN: 250.0000 ug | Freq: Once | INTRAMUSCULAR | Status: DC

## 2011-12-27 NOTE — Progress Notes (Signed)
   Subjective: No palpitations, breathing stable without progressive cough. No chest pain.   Objective: Temp:  [97.4 F (36.3 C)-98.4 F (36.9 C)] 97.4 F (36.3 C) (04/07 0459) Pulse Rate:  [85-127] 93  (04/07 0459) Resp:  [16-18] 18  (04/07 0459) BP: (120-124)/(68-89) 120/68 mmHg (04/07 0459) SpO2:  [90 %-96 %] 92 % (04/07 0757) FiO2 (%):  [21 %] 21 % (04/07 0757) Weight:  [255 lb 3.2 oz (115.758 kg)] 255 lb 3.2 oz (115.758 kg) (04/07 0459)  I/O last 3 completed shifts: In: 576 [P.O.:200; I.V.:376] Out: 650 [Urine:650]  Telemetry - Atrial flutter, some rates into the 90s at rest, typically 120.  Exam -   General - NAD.   Lungs - Coarse but clear, no wheezing.   Cardiac - Irregular, no S3.   Abdomen - NABS.   Extremities - No pitting.  Testing -  Lab Results  Component Value Date   WBC 8.0 12/26/2011   HGB 15.2 12/26/2011   HCT 45.8 12/26/2011   MCV 87.1 12/26/2011   PLT 190 12/26/2011    Lab Results  Component Value Date   CREATININE 1.24 12/27/2011   BUN 13 12/27/2011   NA 137 12/27/2011   K 4.7 12/27/2011   CL 104 12/27/2011   CO2 22 12/27/2011    Lab Results  Component Value Date   CKTOTAL 335* 12/24/2011   CKMB 2.3 12/24/2011   TROPONINI <0.30 12/24/2011    ECG - Atrial flutter at 91 with variable conduction.   Current Medications    . acitretin  25 mg Oral QAC breakfast  . aspirin  325 mg Oral Daily  . atorvastatin  40 mg Oral q1800  . Calcitriol  1 application Topical QHS  . cefUROXime  500 mg Oral BID WC  . dabigatran  150 mg Oral Q12H  . Fluticasone-Salmeterol  1 puff Inhalation BID  . guaiFENesin  600 mg Oral BID  . halobetasol   Topical BID  . ketoconazole   Topical BID  . losartan  100 mg Oral Daily  . metformin  1,000 mg Oral Q breakfast  . metoprolol tartrate  100 mg Oral BID  . mulitivitamin with minerals  1 tablet Oral Daily  . off the beat book   Does not apply Once  . omega-3 acid ethyl esters  1 g Oral Daily  . DISCONTD: acitretin  25 mg  Oral QAC breakfast  . DISCONTD: cilostazol  100 mg Oral BID  . DISCONTD: Warfarin - Pharmacist Dosing Inpatient   Does not apply q1800    Assessment:  1. Recurrent atrial flutter with RVR. Status post ablation 7/11. Heart rate down somewhat, although not optimally controlled despite medication titration. Tentative plan is for TEE guided flutter ablation tomorrow with Dr. Graciela Husbands. He is now on Pradaxa, started yesterday morning.  2. Mild LV systolic dysfunction, LVEF 45-50% by recent echocardiogram. Consider tachycardia mediated. Ultimately plan for outpatient followup Myoview.   3. COPD with mild exacerbations. Possibly also contributing to recurrent arrhythmias. Symptomatically improved.  4. Tobacco abuse.   5. PAD, followed by Dr. Excell Seltzer.   6. Hypertension.   7. Hyperlipidemia, on statin therapy.  Plan:  As noted, tentative plan for TEE guided flutter ablation tomorrow with Dr. Graciela Husbands. Will stop aspirin as he is now on Pradaxa. No definite CAD by history. Continue current rate control strategy. Labs for the morning.   Jonelle Sidle, M.D., F.A.C.C.

## 2011-12-27 NOTE — Progress Notes (Signed)
Patient given Off the Beat Book for patient education. Patient watched Atrial Fibrillation video.

## 2011-12-27 NOTE — Progress Notes (Signed)
  Patient Name: Neil Carroll      SUBJECTIVE:. Recurrent atrial flutter associated with shortness of breath and chest discomfort. He has a history of prior atrial flutter ablation July 2011.  He has multiple thromboembolic risk factors.  Past Medical History  Diagnosis Date  . Other peripheral vascular disease     bilateral lower extremity  . Hyperlipidemia   . Hypertension   . Transient global amnesia   . Contact dermatitis and other eczema due to plants (except food)   . Tobacco use disorder     nondependent  . Atrial flutter     onset  2011  . Arrhythmia     tachycardia  . Type II diabetes mellitus   . Chronic bronchitis   . GERD (gastroesophageal reflux disease)     PHYSICAL EXAM Filed Vitals:   12/26/11 1947 12/26/11 1956 12/27/11 0459 12/27/11 0757  BP:  124/89 120/68   Pulse:  86 93   Temp:   97.4 F (36.3 C)   TempSrc:  Oral Oral   Resp:  17 18   Height:      Weight:   255 lb 3.2 oz (115.758 kg)   SpO2: 96% 92% 90% 92%   Well developed and nourished in no acute distress HENT normal Neck supple with JVP-flat Carotids brisk and full without bruits Clear Irregularly irregular rate and rhythm withrapid ventricular response, no murmurs or gallops Abd-soft with active BS without hepatomegaly No Clubbing cyanosis edema Skin-warm and dry with psoriasis  A & Oriented  Grossly normal sensory and motor function  TELEMETRY: Reviewed telemetry pt in atrial flutter with a moderately rapid ventricular response:    Intake/Output Summary (Last 24 hours) at 12/27/11 1114 Last data filed at 12/27/11 0700  Gross per 24 hour  Intake    180 ml  Output      0 ml  Net    180 ml    LABS: Basic Metabolic Panel:  Lab 12/27/11 1610 12/23/11 1801  NA 137 138  K 4.7 4.0  CL 104 104  CO2 22 22  GLUCOSE 118* 142*  BUN 13 16  CREATININE 1.24 1.38*  CALCIUM 9.1 9.2  MG -- 2.1  PHOS -- --   Cardiac Enzymes: No results found for this basename:  CKTOTAL:3,CKMB:3,CKMBINDEX:3,TROPONINI:3 in the last 72 hours CBC:  Lab 12/26/11 0730 12/25/11 0602 12/24/11 0910 12/23/11 1801  WBC 8.0 8.0 8.0 9.8  NEUTROABS -- -- -- 7.1  HGB 15.2 15.1 15.0 15.0  HCT 45.8 44.8 43.9 44.5  MCV 87.1 85.3 85.9 85.6  PLT 190 191 181 181   PROTIME:  Basename 12/26/11 0730 12/25/11 0602  LABPROT 14.2 13.2  INR 1.08 0.98      ASSESSMENT AND PLAN:  Recurrent atrial flutter. Rate control has been difficult. He was in on arrival. He has been started on pradaxa. By tomorrow morning he should have had 5 doses. We have discussed transesophageal echo followed by catheter ablation of the atrial flutter substrate. He recalls the risks which we reviewed.  He is agreeable to proceeding.    Signed, Sherryl Manges MD  12/27/2011

## 2011-12-28 ENCOUNTER — Encounter (HOSPITAL_COMMUNITY): Payer: Self-pay

## 2011-12-28 ENCOUNTER — Encounter (HOSPITAL_COMMUNITY)
Admission: AD | Disposition: A | Discharge status: Home / Self Care | Payer: Self-pay | Source: Ambulatory Visit | Attending: Cardiology

## 2011-12-28 ENCOUNTER — Other Ambulatory Visit: Payer: Self-pay

## 2011-12-28 ENCOUNTER — Ambulatory Visit: Payer: Federal, State, Local not specified - PPO | Admitting: Nurse Practitioner

## 2011-12-28 DIAGNOSIS — I4892 Unspecified atrial flutter: Secondary | ICD-10-CM

## 2011-12-28 HISTORY — PX: A FLUTTER ABLATION: SHX5348

## 2011-12-28 HISTORY — PX: TEE WITHOUT CARDIOVERSION: SHX5443

## 2011-12-28 LAB — CBC
Hemoglobin: 15.4 g/dL (ref 13.0–17.0)
MCH: 28.8 pg (ref 26.0–34.0)
MCHC: 32.9 g/dL (ref 30.0–36.0)
RDW: 14.4 % (ref 11.5–15.5)

## 2011-12-28 LAB — PROTIME-INR
INR: 1.33 (ref 0.00–1.49)
Prothrombin Time: 16.7 seconds — ABNORMAL HIGH (ref 11.6–15.2)

## 2011-12-28 LAB — BASIC METABOLIC PANEL
BUN: 13 mg/dL (ref 6–23)
Calcium: 9.3 mg/dL (ref 8.4–10.5)
Creatinine, Ser: 1.24 mg/dL (ref 0.50–1.35)
GFR calc Af Amer: 71 mL/min — ABNORMAL LOW (ref >90)
GFR calc non Af Amer: 61 mL/min — ABNORMAL LOW (ref >90)
Glucose, Bld: 138 mg/dL — ABNORMAL HIGH (ref 70–99)
Potassium: 5.1 mEq/L (ref 3.5–5.1)

## 2011-12-28 LAB — GLUCOSE, CAPILLARY
Glucose-Capillary: 113 mg/dL — ABNORMAL HIGH (ref 70–99)
Glucose-Capillary: 109 mg/dL — ABNORMAL HIGH (ref 70–99)

## 2011-12-28 SURGERY — ABLATION, ARRHYTHMOGENIC FOCUS, FOR ATRIAL FLUTTER
Anesthesia: LOCAL | Wound class: Clean

## 2011-12-28 SURGERY — ECHOCARDIOGRAM, TRANSESOPHAGEAL
Anesthesia: Moderate Sedation

## 2011-12-28 MED ORDER — FENTANYL CITRATE 0.05 MG/ML IJ SOLN
INTRAMUSCULAR | Status: AC
  Filled 2011-12-28: qty 2

## 2011-12-28 MED ORDER — LOSARTAN POTASSIUM 50 MG PO TABS
100.0000 mg | ORAL_TABLET | Freq: Every day | ORAL | Status: DC
  Administered 2011-12-28: 100 mg via ORAL

## 2011-12-28 MED ORDER — SULFAMETHOXAZOLE-TMP DS 800-160 MG PO TABS: 1.0000 | ORAL_TABLET | Freq: Two times a day (BID) | ORAL | Status: DC

## 2011-12-28 MED ORDER — ACETAMINOPHEN 325 MG PO TABS: 650.0000 mg | ORAL_TABLET | ORAL | Status: DC | PRN

## 2011-12-28 MED ORDER — MIDAZOLAM HCL 5 MG/5ML IJ SOLN
INTRAMUSCULAR | Status: AC
  Filled 2011-12-28: qty 5

## 2011-12-28 MED ORDER — MUPIROCIN 2 % EX OINT
1.0000 "application " | TOPICAL_OINTMENT | Freq: Two times a day (BID) | CUTANEOUS | Status: DC
  Administered 2011-12-29: 1 via TOPICAL
  Filled 2011-12-28: qty 22

## 2011-12-28 MED ORDER — CILOSTAZOL 100 MG PO TABS
100.0000 mg | ORAL_TABLET | Freq: Two times a day (BID) | ORAL | Status: DC
  Administered 2011-12-28 – 2011-12-29 (×2): 100 mg via ORAL
  Filled 2011-12-28 (×3): qty 1

## 2011-12-28 MED ORDER — METFORMIN HCL ER 500 MG PO TB24: 1000.0000 mg | ORAL_TABLET | Freq: Every day | ORAL | Status: DC

## 2011-12-28 MED ORDER — HEPARIN (PORCINE) IN NACL 2-0.9 UNIT/ML-% IJ SOLN
INTRAMUSCULAR | Status: AC
  Filled 2011-12-28: qty 1000

## 2011-12-28 MED ORDER — ATORVASTATIN CALCIUM 40 MG PO TABS
40.0000 mg | ORAL_TABLET | Freq: Every day | ORAL | Status: DC
  Administered 2011-12-28: 40 mg via ORAL

## 2011-12-28 MED ORDER — SODIUM CHLORIDE 0.9 % IJ SOLN: 3.0000 mL | INTRAMUSCULAR | Status: DC | PRN

## 2011-12-28 MED ORDER — BUPIVACAINE HCL (PF) 0.25 % IJ SOLN
INTRAMUSCULAR | Status: AC
  Filled 2011-12-28: qty 30

## 2011-12-28 MED ORDER — FENTANYL CITRATE 0.05 MG/ML IJ SOLN
INTRAMUSCULAR | Status: DC | PRN
  Administered 2011-12-28 (×2): 25 ug via INTRAVENOUS

## 2011-12-28 MED ORDER — SODIUM CHLORIDE 0.9 % IJ SOLN
3.0000 mL | Freq: Two times a day (BID) | INTRAMUSCULAR | Status: DC
  Administered 2011-12-28: 3 mL via INTRAVENOUS

## 2011-12-28 MED ORDER — FLUTICASONE-SALMETEROL 100-50 MCG/DOSE IN AEPB: 1.0000 | INHALATION_SPRAY | Freq: Two times a day (BID) | RESPIRATORY_TRACT | Status: DC

## 2011-12-28 MED ORDER — FENTANYL CITRATE 0.05 MG/ML IJ SOLN
INTRAMUSCULAR | Status: AC
  Filled 2011-12-28: qty 4

## 2011-12-28 MED ORDER — METOPROLOL TARTRATE 50 MG PO TABS: 50.0000 mg | ORAL_TABLET | Freq: Two times a day (BID) | ORAL | Status: DC

## 2011-12-28 MED ORDER — ONDANSETRON HCL 4 MG/2ML IJ SOLN: 4.0000 mg | Freq: Four times a day (QID) | INTRAMUSCULAR | Status: DC | PRN

## 2011-12-28 MED ORDER — DIPHENHYDRAMINE HCL 50 MG/ML IJ SOLN
INTRAMUSCULAR | Status: AC
  Filled 2011-12-28: qty 1

## 2011-12-28 MED ORDER — MIDAZOLAM HCL 10 MG/2ML IJ SOLN
INTRAMUSCULAR | Status: AC
  Filled 2011-12-28: qty 4

## 2011-12-28 MED ORDER — SODIUM CHLORIDE 0.9 % IV SOLN: 250.0000 mL | INTRAVENOUS | Status: DC | PRN

## 2011-12-28 MED ORDER — MIDAZOLAM HCL 10 MG/2ML IJ SOLN
INTRAMUSCULAR | Status: DC | PRN
  Administered 2011-12-28 (×2): 2 mg via INTRAVENOUS

## 2011-12-28 MED ORDER — BUTAMBEN-TETRACAINE-BENZOCAINE 2-2-14 % EX AERO
INHALATION_SPRAY | CUTANEOUS | Status: DC | PRN
  Administered 2011-12-28: 2 via TOPICAL

## 2011-12-28 MED ORDER — ADULT MULTIVITAMIN W/MINERALS CH
1.0000 | ORAL_TABLET | Freq: Every day | ORAL | Status: DC
  Administered 2011-12-28: 1 via ORAL

## 2011-12-28 NOTE — Interval H&P Note (Signed)
History and Physical Interval Note:  12/28/2011 10:48 AM  Neil Carroll  has presented today for surgery, with the diagnosis of pre ablation/looking for clot  The various methods of treatment have been discussed with the patient and family. After consideration of risks, benefits and other options for treatment, the patient has consented to  Procedure(s) (LRB): TRANSESOPHAGEAL ECHOCARDIOGRAM (TEE) (N/A) as a surgical intervention .  The patients' history has been reviewed, patient examined, no change in status, stable for surgery.  I have reviewed the patients' chart and labs.  Questions were answered to the patient's satisfaction.     Neil Carroll

## 2011-12-28 NOTE — CV Procedure (Signed)
Procedure: TEE  Indication: Atrial flutter, pre-ablation  Sedation: Versed 4 mg IV, Fentanyl 50 mcg IV  No complications  Findings: See report in echo section for full report.  In brief, patient was in atrial flutter with rate in the 110s.  EF about 50%.  Mild to moderate MR, mild AI, no LAA thrombus.    OK to proceed to ablation.

## 2011-12-28 NOTE — Progress Notes (Signed)
Patient ID: Neil Carroll, male   DOB: 04/24/1951, 61 y.o.   MRN: [id number] Subjective:  Sleepy. I did not rest well last night.  Objective:  Vital Signs in the last 24 hours: Temp:  [97.6 F (36.4 C)-99.7 F (37.6 C)] 97.6 F (36.4 C) (04/08 0948) Pulse Rate:  [79-100] 90  (04/08 0948) Resp:  [12-23] 16  (04/08 1140) BP: (100-141)/(63-87) 117/72 mmHg (04/08 1140) SpO2:  [89 %-97 %] 93 % (04/08 1140)  Intake/Output from previous day: 04/07 0701 - 04/08 0700 In: 1485 [P.O.:720; I.V.:765] Out: 400 [Urine:400] Intake/Output from this shift:    Physical Exam: Well appearing NAD HEENT: Unremarkable Neck:  No JVD, no thyromegally Lymphatics:  No adenopathy Back:  No CVA tenderness Lungs:  Clear HEART:  Regular rate rhythm, no murmurs, no rubs, no clicks Abd:  Flat, positive bowel sounds, no organomegally, no rebound, no guarding Ext:  2 plus pulses, no edema, no cyanosis, no clubbing Skin:  No rashes no nodules Neuro:  CN II through XII intact, motor grossly intact  Lab Results:  Basename 12/28/11 0519 12/26/11 0730  WBC 11.1* 8.0  HGB 15.4 15.2  PLT 192 190    Basename 12/28/11 0519 12/27/11 0640  NA 137 137  K 5.1 4.7  CL 103 104  CO2 24 22  GLUCOSE 138* 118*  BUN 13 13  CREATININE 1.24 1.24   No results found for this basename: TROPONINI:2,CK,MB:2 in the last 72 hours Hepatic Function Panel No results found for this basename: PROT,ALBUMIN,AST,ALT,ALKPHOS,BILITOT,BILIDIR,IBILI in the last 72 hours No results found for this basename: CHOL in the last 72 hours No results found for this basename: PROTIME in the last 72 hours  Imaging: No results found.  Cardiac Studies: Tele - atrial flutter Assessment/Plan:  1. Atrial flutter - I have discussed the treatment options with the patient and the risks/benefits/goals/expectations of catheter ablation of atrial flutter have been reviewed and he wishes to proceed.  LOS: 5 days    Corianna Avallone 12/28/2011,  12:38 PM     

## 2011-12-28 NOTE — Progress Notes (Signed)
  Echocardiogram Echocardiogram Transesophageal has been performed.  Neil Carroll [facility] 12/28/2011, 11:20 AM

## 2011-12-28 NOTE — Interval H&P Note (Signed)
History and Physical Interval Note:  12/28/2011 12:40 PM  Neil Carroll  has presented today for surgery, with the diagnosis of aflutter  The various methods of treatment have been discussed with the patient and family. After consideration of risks, benefits and other options for treatment, the patient has consented to  Procedure(s) (LRB): ABLATION A FLUTTER (N/A) as a surgical intervention .  The patients' history has been reviewed, patient examined, no change in status, stable for surgery.  I have reviewed the patients' chart and labs.  Questions were answered to the patient's satisfaction.     Lewayne Bunting

## 2011-12-28 NOTE — Op Note (Signed)
EPS/RFA of atrial flutter performed without immediate complication. [id number].

## 2011-12-28 NOTE — H&P (View-Only) (Signed)
  Patient Name: Neil Carroll      SUBJECTIVE:. Recurrent atrial flutter associated with shortness of breath and chest discomfort. He has a history of prior atrial flutter ablation July 2011.  He has multiple thromboembolic risk factors.  Past Medical History  Diagnosis Date  . Other peripheral vascular disease     bilateral lower extremity  . Hyperlipidemia   . Hypertension   . Transient global amnesia   . Contact dermatitis and other eczema due to plants (except food)   . Tobacco use disorder     nondependent  . Atrial flutter     onset  2011  . Arrhythmia     tachycardia  . Type II diabetes mellitus   . Chronic bronchitis   . GERD (gastroesophageal reflux disease)     PHYSICAL EXAM Filed Vitals:   12/26/11 1947 12/26/11 1956 12/27/11 0459 12/27/11 0757  BP:  124/89 120/68   Pulse:  86 93   Temp:   97.4 F (36.3 C)   TempSrc:  Oral Oral   Resp:  17 18   Height:      Weight:   255 lb 3.2 oz (115.758 kg)   SpO2: 96% 92% 90% 92%   Well developed and nourished in no acute distress HENT normal Neck supple with JVP-flat Carotids brisk and full without bruits Clear Irregularly irregular rate and rhythm withrapid ventricular response, no murmurs or gallops Abd-soft with active BS without hepatomegaly No Clubbing cyanosis edema Skin-warm and dry with psoriasis  A & Oriented  Grossly normal sensory and motor function  TELEMETRY: Reviewed telemetry pt in atrial flutter with a moderately rapid ventricular response:    Intake/Output Summary (Last 24 hours) at 12/27/11 1114 Last data filed at 12/27/11 0700  Gross per 24 hour  Intake    180 ml  Output      0 ml  Net    180 ml    LABS: Basic Metabolic Panel:  Lab 12/27/11 0640 12/23/11 1801  NA 137 138  K 4.7 4.0  CL 104 104  CO2 22 22  GLUCOSE 118* 142*  BUN 13 16  CREATININE 1.24 1.38*  CALCIUM 9.1 9.2  MG -- 2.1  PHOS -- --   Cardiac Enzymes: No results found for this basename:  CKTOTAL:3,CKMB:3,CKMBINDEX:3,TROPONINI:3 in the last 72 hours CBC:  Lab 12/26/11 0730 12/25/11 0602 12/24/11 0910 12/23/11 1801  WBC 8.0 8.0 8.0 9.8  NEUTROABS -- -- -- 7.1  HGB 15.2 15.1 15.0 15.0  HCT 45.8 44.8 43.9 44.5  MCV 87.1 85.3 85.9 85.6  PLT 190 191 181 181   PROTIME:  Basename 12/26/11 0730 12/25/11 0602  LABPROT 14.2 13.2  INR 1.08 0.98      ASSESSMENT AND PLAN:  Recurrent atrial flutter. Rate control has been difficult. He was in on arrival. He has been started on pradaxa. By tomorrow morning he should have had 5 doses. We have discussed transesophageal echo followed by catheter ablation of the atrial flutter substrate. He recalls the risks which we reviewed.  He is agreeable to proceeding.    Signed, Jerald Villalona MD  12/27/2011   

## 2011-12-28 NOTE — H&P (View-Only) (Signed)
Patient ID: Neil Carroll, male   DOB: 24-Feb-1951, 61 y.o.   MRN: [id number] Subjective:  Sleepy. I did not rest well last night.  Objective:  Vital Signs in the last 24 hours: Temp:  [97.6 F (36.4 C)-99.7 F (37.6 C)] 97.6 F (36.4 C) (04/08 0948) Pulse Rate:  [79-100] 90  (04/08 0948) Resp:  [12-23] 16  (04/08 1140) BP: (100-141)/(63-87) 117/72 mmHg (04/08 1140) SpO2:  [89 %-97 %] 93 % (04/08 1140)  Intake/Output from previous day: 04/07 0701 - 04/08 0700 In: 1485 [P.O.:720; I.V.:765] Out: 400 [Urine:400] Intake/Output from this shift:    Physical Exam: Well appearing NAD HEENT: Unremarkable Neck:  No JVD, no thyromegally Lymphatics:  No adenopathy Back:  No CVA tenderness Lungs:  Clear HEART:  Regular rate rhythm, no murmurs, no rubs, no clicks Abd:  Flat, positive bowel sounds, no organomegally, no rebound, no guarding Ext:  2 plus pulses, no edema, no cyanosis, no clubbing Skin:  No rashes no nodules Neuro:  CN II through XII intact, motor grossly intact  Lab Results:  Basename 12/28/11 0519 12/26/11 0730  WBC 11.1* 8.0  HGB 15.4 15.2  PLT 192 190    Basename 12/28/11 0519 12/27/11 0640  NA 137 137  K 5.1 4.7  CL 103 104  CO2 24 22  GLUCOSE 138* 118*  BUN 13 13  CREATININE 1.24 1.24   No results found for this basename: TROPONINI:2,CK,MB:2 in the last 72 hours Hepatic Function Panel No results found for this basename: PROT,ALBUMIN,AST,ALT,ALKPHOS,BILITOT,BILIDIR,IBILI in the last 72 hours No results found for this basename: CHOL in the last 72 hours No results found for this basename: PROTIME in the last 72 hours  Imaging: No results found.  Cardiac Studies: Tele - atrial flutter Assessment/Plan:  1. Atrial flutter - I have discussed the treatment options with the patient and the risks/benefits/goals/expectations of catheter ablation of atrial flutter have been reviewed and he wishes to proceed.  LOS: 5 days    Lewayne Bunting 12/28/2011,  12:38 PM

## 2011-12-29 ENCOUNTER — Encounter (HOSPITAL_COMMUNITY): Payer: Self-pay | Admitting: Cardiology

## 2011-12-29 LAB — GLUCOSE, CAPILLARY: Glucose-Capillary: 120 mg/dL — ABNORMAL HIGH (ref 70–99)

## 2011-12-29 MED ORDER — DABIGATRAN ETEXILATE MESYLATE 150 MG PO CAPS: 150.0000 mg | ORAL_CAPSULE | Freq: Two times a day (BID) | ORAL | Status: DC

## 2011-12-29 MED ORDER — METOPROLOL TARTRATE 100 MG PO TABS: 100.0000 mg | ORAL_TABLET | Freq: Two times a day (BID) | ORAL | Status: DC

## 2011-12-29 MED ORDER — HYDROCODONE-HOMATROPINE 5-1.5 MG/5ML PO SYRP: 5.0000 mL | ORAL_SOLUTION | Freq: Four times a day (QID) | ORAL | Status: AC | PRN

## 2011-12-29 MED ORDER — AMMONIUM LACTATE 12 % EX CREA: TOPICAL_CREAM | CUTANEOUS | Status: DC | PRN

## 2011-12-29 NOTE — Op Note (Signed)
Neil Carroll, Neil Carroll          ACCOUNT NO.:  [id number]  MEDICAL RECORD NO.:  [id number]  LOCATION:  2019                         FACILITY:  [facility]  PHYSICIAN:  Doylene Canning. Ladona Ridgel, MD    DATE OF BIRTH:  [birth year]  DATE OF PROCEDURE:  12/28/2011 DATE OF DISCHARGE:                              OPERATIVE REPORT   SURGEON:  Doylene Canning. Ladona Ridgel, MD  PROCEDURE PERFORMED:  Electrophysiologic study and catheter ablation of atrial flutter.  INTRODUCTION:  The patient is a very pleasant 61 year old male with a history of atrial flutter, who underwent catheter ablation almost 2 years ago.  The patient developed recurrent tachypalpitations and shortness of breath, and was admitted to the hospital with recurrent atrial flutter.  His rate has been controlled with IV Cardizem.  He has been treated with a transesophageal echo, which demonstrated no left atrial appendage thrombus and is now referred for catheter ablation.  PROCEDURE:  After informed consent was obtained, the patient was taken to the Diagnostic EP Lab in the fasting state.  After usual preparation and draping, intravenous fentanyl and midazolam was given for sedation. A 6-French hexapolar catheter was inserted percutaneously into the right femoral vein and advanced into the coronary sinus.  A 5-French quadripolar catheter was inserted percutaneously in the right femoral vein and advanced to the His bundle region.  A 7-French quadripolar ablation catheter was inserted percutaneously into the right femoral vein and advanced to the right atrium.  Mapping was first carried out. Utilizing entrainment mapping, concealed entrainment was demonstrated with a postpacing interval only 5 milliseconds longer than the atrial flutter cycle length.  At this point, the ablation catheter was maneuvered into the usual atrial flutter isthmus.  During the second RF energy application, atrial flutter was terminated and sinus rhythm was restored.  At this  point, three additional RF energy applications were delivered resulting in the creation of bidirectional isthmus block. Finally, three Bonus RF energy applications were delivered and the patient was observed for approximately 20 minutes.  During that time, rapid ventricular pacing was carried out from the right ventricle demonstrating a VA Wenckebach cycle length of 320 milliseconds. Programmed ventricular stimulation was carried out from the right ventricle at a base drive cycle length of 161 milliseconds and the S1-S2 interval was stepwise decreased down to 230 milliseconds where ventricular refractoriness was observed.  During programmed ventricular stimulation, the atrial activation sequence was midline and decremental. Next, programmed atrial stimulation was carried out from the coronary sinus at a base drive cycle length of 096 milliseconds.  The S1-S2 interval was stepwise decreased down to 300 milliseconds with the AV node ERP was observed.  During programmed atrial stimulation, there was an AH jump and no echo beats, and no inducible SVT.  Finally, rapid atrial pacing was carried out from the coronary sinus and stepwise decreased down to 370 milliseconds where AV Wenckebach was observed. Additional decrements were carried out down to 210 milliseconds and there was no inducible atrial arrhythmias.  At this point, the catheters were removed.  Hemostasis was assured, and the patient was returned to his room in satisfactory condition.  COMPLICATIONS:  There were no immediate procedure complications.  RESULTS:  A.  Baseline ECG:  Baseline ECG demonstrates atrial flutter with a controlled ventricular response. B.  Baseline intervals:  The atrial flutter cycle length was 256 milliseconds, the HV interval was 25 milliseconds.  The QRS duration was 95 milliseconds. C.  Rapid ventricular pacing:  Rapid ventricular pacing following ablation demonstrated VA Wenckebach at 320 milliseconds.   During rapid ventricular pacing, the atrial activation was midline and decremental. D.  Programmed ventricular stimulation:  Programmed ventricular stimulation was carried out from the right ventricle at 500 milliseconds.  The S1-S2 interval was stepwise decreased down to 230 milliseconds where ventricular refractoriness was observed.  During programmed ventricular stimulation, the atrial activation sequence was midline and decremental. E.  Rapid atrial pacing:  Rapid atrial pacing was carried out from the coronary sinus demonstrating an AV Wenckebach cycle length of 370 milliseconds.  Additional rapid atrial pacing was carried out from the right atrium demonstrating bidirectional atrial flutter isthmus block. Finally, rapid atrial pacing was carried out down to 210 milliseconds following an ablation and there were no inducible arrhythmias. F.  Programmed atrial stimulation:  Programmed atrial stimulation was carried out from the coronary sinus and the right atrium base drive cycle length of 161 milliseconds.  The S1-S2 interval was stepwise decreased down to 300 milliseconds where the AV node ERP was observed. During programmed atrial stimulation, there were no AH jumps, no echo beats, no inducible SVT. G.  Arrhythmias observed: 1. Atrial flutter initiation was present at the time of EP study,     duration was sustained, cycle length 256 milliseconds.  Method of     termination was with catheter ablation.     a.     Mapping:  Mapping of the atrial flutter isthmus demonstrated      usual size and orientation.     b.     RF energy application:  Total of eight RF energy      applications were delivered resulting in the termination of atrial      flutter, restoration of sinus rhythm, and creation of      bidirectional atrial flutter isthmus block.  CONCLUSION:  This study demonstrates successful electrophysiologic study and RF catheter ablation of typical atrial flutter with a total of  eight RF energy applications resulting in termination of flutter, restoration of sinus rhythm, and creation of bidirectional block and atrial flutter isthmus.     Doylene Canning. Ladona Ridgel, MD     GWT/MEDQ  D:  12/28/2011  T:  12/29/2011  Job:  [id number]

## 2011-12-29 NOTE — Discharge Instructions (Signed)
   You have a Stress Test scheduled on 01/05/12 at [facility] in [place]. Your doctor has ordered this test to get a better idea of how your heart works.  Please arrive 15 minutes early for paperwork.   Location: [address], [address] [place], [place] [address] [phone]  Instructions:  No food/drink after midnight the night before.   No caffeine/decaf products 24 hours before, including medicines such as Excedrin or Goody Powders. Call if there are any questions.   Wear comfortable clothes and shoes.   It is OK to take your morning meds with a sip of water EXCEPT for those types of medicines listed below or otherwise instructed.  Special Medication Instructions:  Beta blockers such as metoprolol (Lopressor/Toprol XL), atenolol (Tenormin), carvedilol (Coreg), nebivolol (Bystolic), propranolol (Inderal) should not be taken for 24 hours before the test.  Calcium channel blockers such as diltiazem (Cardizem) or verapmil (Calan) should not be taken for 24 hours before the test.  Remove nitroglycerin patches and do not take nitrate preparations such as Imdur/isosorbide the day of your test.  No Persantine/Theophylline or Aggrenox medicines should be used within 24 hours of the test.   What To Expect: The whole test will take several hours. When you arrive in the lab, the technician will inject a small amount of radioactive tracer into your arm through an IV while you are resting quietly. This helps [name] to form pictures of your heart. You will likely only feel a sting from the IV. After a waiting period, resting pictures will be obtained under a big camera. These are the "before" pictures.  Next, you will be prepped for the stress portion of the test. This may include either walking on a treadmill or receiving a medicine that helps to dilate blood vessels in your heart to simulate the effect of exercise on your heart. If you are walking on a treadmill, you will walk at  different paces to try to get your heart rate to a goal number that is based on your age. If your doctor has chosen the pharmacologic test, then you will receive a medicine through your IV that may cause temporary nausea, flushing, shortness of breath and sometimes chest discomfort or vomiting. This is typically short-lived and usually resolves quickly. Your blood pressure and heart rate will be monitored, and we will be watching your EKG on a computer screen for any changes. During this portion of the test, the radiologist will inject another small amount of radioactive tracer into your IV. After a waiting period, you will undergo a second set of pictures. These are the "after" pictures.  The doctor reading the test will compare the before-and-after images to look for evidence of heart blockages or heart weakness. In certain instances, this test is done over 2 days but usually only takes 1 day to complete.

## 2011-12-29 NOTE — Discharge Summary (Signed)
Discharge Summary   Patient ID: Neil EGNOR MRN: [id number], DOB/AGE: May 09, 1951 61 y.o. Admit date: 12/23/2011 D/C date:     12/29/2011   Primary Discharge Diagnoses:  1. Atrial flutter s/p EPS/RFA this admission for uncontrolled rates - plan per Dr. Ladona Ridgel for 1 month of Pradaxa - initial onset 2011 2. LV dysfunction with EF 45-50%, possibly tachycardia mediated - for outpatient stress Myoview  3. Chronic bronchitis with possible mild COPD exacerbation this admission  Secondary Discharge Diagnoses:  1. HTN 2. HL 3. Transient global amnesia 4. Contact dermatitis and other eczema due to plants 5. Tobacco use disorder 6. Diabetes mellitus 7. GERD  Hospital Course: 61 y/o M with hx of atrial flutter & prior normal LV function. He saw Dr. Alwyn Ren on 12/21/2011 for bronchitis, was having palpitations and was noted to be in atrial flutter with rapid ventricular response. His metoprolol was increased to 75 mg twice a day. Mr. Redmann tolerated this dose well but continued to have intermittent palpitations. When he exerts himself, he will feel tachycardia, palpitations, then gets short of breath with chest tightness. He had been taking Septra as an outpatient and thus was changed to ceftin as inpatient for concern for mild COPD exacerbation. EKG in the ER on admission showed atrial flutter rate 120 with diffuse ST depression felt nondiagnostic. IV heparin was started along with IV cardizem, which was later changed to oral cardizem but rates were poorly controlled requiring transition back to IV. 2D echo was obtained demonstrating EF 45-50%, possibly felt tachycardia-mediated but it was thought he would need Myoview at some point. On 12/26/11, he was changed to Pradaxa with plan sfor TEE-guided flutter ablation. He underwent TEE 4/8 demonstrating mild to moderate MR, mild AI, no LAA thrombus. He then underwent EPS/RFA of atrial flutter the same day without immediate complication. He tolerated the  procedure well. Today he feels well. The patient was seen and examined today and felt stable for discharge by Dr. Ladona Ridgel. He would like the patient to continue Pradaxa x 1 month (available for pickup at our office) and finish out his course of Septra. His long-term anticoagulation may need to be discussed if his EF continues to be low, but this will be addressed as an outpatient.  Discharge Vitals: Blood pressure 121/76, pulse 80, temperature 98.1 F (36.7 C), temperature source Oral, resp. rate 19, height 5\' 11"  (1.803 m), weight 258 lb 14.4 oz (117.436 kg), SpO2 93.00%.  Labs: Lab Results  Component Value Date   WBC 11.1* 12/28/2011   HGB 15.4 12/28/2011   HCT 46.8 12/28/2011   MCV 87.5 12/28/2011   PLT 192 12/28/2011     Lab 12/28/11 0519 12/23/11 1801  NA 137 --  K 5.1 --  CL 103 --  CO2 24 --  BUN 13 --  CREATININE 1.24 --  CALCIUM 9.3 --  PROT -- 7.6  BILITOT -- 0.2*  ALKPHOS -- 61  ALT -- 17  AST -- 16  GLUCOSE 138* --    Lab Results  Component Value Date   CHOL 98 06/24/2011   HDL 32.50* 06/24/2011   LDLCALC 45 06/24/2011   TRIG 105.0 06/24/2011    Diagnostic Studies/Procedures   1. X-ray Chest Pa And Lateral 12/23/2011  *RADIOLOGY REPORT*  Clinical Data: Palpitations.  Upper respiratory infection.  CHEST - 2 VIEW  Comparison: The seventh 15-1011.  Findings: The heart is normal in size and stable.  The mediastinal and hilar contours are unchanged.  There are emphysematous  changes and areas of pulmonary scarring.  Suspect right upper lobe bronchiectasis.  No acute overlying pulmonary process.  No pleural effusion.  The bony thorax is intact.  IMPRESSION:  1.  Emphysematous changes and pulmonary scarring with probable right upper lobe bronchiectasis. 2.  No acute overlying pulmonary process.  Original Report Authenticated By: P. Loralie Champagne, M.D.   2. 2D Echo 12/24/11 Study Conclusions - Left ventricle: Wall thickness was increased in a pattern of mild LVH. Systolic function was  mildly reduced. The estimated ejection fraction was in the range of 45% to 50%. - Aortic valve: Trivial regurgitation. - Mitral valve: Mild regurgitation. - Left atrium: The atrium was mildly dilated. - Pulmonary arteries: PA peak pressure: 35mm Hg (S).   Discharge Medications   Medication List  As of 12/29/2011  9:59 AM   STOP taking these medications         ADVAIR DISKUS 100-50 MCG/DOSE Aepb      aspirin 325 MG tablet      CENTRUM PO      CRESTOR 20 MG tablet      FORTAMET 1000 MG (OSM) 24 hr tablet      metoprolol 50 MG tablet         TAKE these medications         acitretin 25 MG capsule   Commonly known as: SORIATANE   Take 25 mg by mouth daily before breakfast.      ammonium lactate 12 % cream   Commonly known as: AMLACTIN   Apply topically as needed.      cilostazol 100 MG tablet   Commonly known as: PLETAL   Take 100 mg by mouth 2 (two) times daily.      dabigatran 150 MG Caps   Commonly known as: PRADAXA   Take 1 capsule (150 mg total) by mouth every 12 (twelve) hours.      Fish Oil 1000 MG Caps   Take 2 capsules by mouth daily.      Flax Seed Oil 1000 MG Caps   Take 1 capsule by mouth daily.      Fluticasone-Salmeterol 100-50 MCG/DOSE Aepb   Commonly known as: ADVAIR   Inhale 1 puff into the lungs every 12 (twelve) hours.      halobetasol 0.05 % cream   Commonly known as: ULTRAVATE   Apply topically 2 (two) times daily.      HYDROcodone-homatropine 5-1.5 MG/5ML syrup   Commonly known as: HYCODAN   Take 5 mLs by mouth every 6 (six) hours as needed for cough.      ketoconazole 2 % cream   Commonly known as: NIZORAL   Apply topically 2 (two) times daily.      losartan 100 MG tablet   Commonly known as: COZAAR   Take 100 mg by mouth daily.      metFORMIN 1000 MG (MOD) 24 hr tablet   Commonly known as: GLUMETZA   Take 1,000 mg by mouth daily with breakfast.      metoprolol 100 MG tablet   Commonly known as: LOPRESSOR   Take 1 tablet  (100 mg total) by mouth 2 (two) times daily.      mulitivitamin with minerals Tabs   Take 1 tablet by mouth daily.      mupirocin ointment 2 %   Commonly known as: BACTROBAN   Apply 1 application topically 2 (two) times daily.      rosuvastatin 20 MG tablet   Commonly known as: CRESTOR  Take 20 mg by mouth daily.      sulfamethoxazole-trimethoprim 800-160 MG per tablet   Commonly known as: BACTRIM DS   Take 1 tablet by mouth 2 (two) times daily.      triamcinolone cream 0.1 %   Commonly known as: KENALOG   Apply topically 2 (two) times daily.      VECTICAL 3 MCG/GM cream   Generic drug: Calcitriol   Apply topically at bedtime.          The patient's med reconciliation contained many duplicates as entered by the pharmacy tech. New meds include Pradaxa, increased dose of metoprolol. Per Dr Ladona Ridgel stop ASA and continue pletal. He was instructed to finish out his course of septra.  Disposition   The patient will be discharged in stable condition to home. Discharge Orders    Future Appointments: Provider: Department: Dept Phone: Center:   01/05/2012 9:30 AM [facility] ([facility] Nuclear Med  None   02/03/2012 3:45 PM Marinus Maw, MD [facility] [phone] [facility]     Future Orders Please Complete By Expires   Diet - low sodium heart healthy      Increase activity slowly      Comments:   No driving for 2 days. No lifting over 5 lbs for 1 week. No sexual activity for 1 week. Keep procedure site clean & dry. If you notice increased pain, swelling, bleeding or pus, call/return!  You may shower, but no soaking baths/hot tubs/pools for 1 week.     Discharge instructions      Comments:   Doreatha Martin out your Septra antibiotic as previously prescribed.     Follow-up Information    Follow up with  [facility]. (Please pick up your one-month supply of Pradaxa at our office today before 4:30pm)    Contact information:   [address] [place] [address] [phone]      Follow up with [facility]. (Stress Test 01/05/12 at 9:30 am, see attached instructions)    Contact information:   [address] [place] [address]       Follow up with Lewayne Bunting, MD. (02/03/12 at 3:45pm)    Contact information:   [address] [place] [address] [phone]            Duration of Discharge Encounter: Greater than 30 minutes including physician and PA time.  Signed, Ronie Spies PA-C 12/29/2011, 9:59 AM

## 2012-01-05 ENCOUNTER — Ambulatory Visit (HOSPITAL_COMMUNITY): Payer: Federal, State, Local not specified - PPO | Attending: Cardiology | Admitting: Radiology

## 2012-01-05 VITALS — BP 124/60 | Ht 71.0 in | Wt 255.0 lb

## 2012-01-05 DIAGNOSIS — I471 Supraventricular tachycardia: Secondary | ICD-10-CM

## 2012-01-05 DIAGNOSIS — R61 Generalized hyperhidrosis: Secondary | ICD-10-CM | POA: Insufficient documentation

## 2012-01-05 DIAGNOSIS — R002 Palpitations: Secondary | ICD-10-CM | POA: Insufficient documentation

## 2012-01-05 DIAGNOSIS — I10 Essential (primary) hypertension: Secondary | ICD-10-CM | POA: Insufficient documentation

## 2012-01-05 DIAGNOSIS — E785 Hyperlipidemia, unspecified: Secondary | ICD-10-CM | POA: Insufficient documentation

## 2012-01-05 DIAGNOSIS — I4892 Unspecified atrial flutter: Secondary | ICD-10-CM | POA: Insufficient documentation

## 2012-01-05 DIAGNOSIS — R079 Chest pain, unspecified: Secondary | ICD-10-CM

## 2012-01-05 DIAGNOSIS — J449 Chronic obstructive pulmonary disease, unspecified: Secondary | ICD-10-CM | POA: Insufficient documentation

## 2012-01-05 DIAGNOSIS — R000 Tachycardia, unspecified: Secondary | ICD-10-CM | POA: Insufficient documentation

## 2012-01-05 DIAGNOSIS — R0602 Shortness of breath: Secondary | ICD-10-CM

## 2012-01-05 DIAGNOSIS — E669 Obesity, unspecified: Secondary | ICD-10-CM | POA: Insufficient documentation

## 2012-01-05 DIAGNOSIS — I4891 Unspecified atrial fibrillation: Secondary | ICD-10-CM

## 2012-01-05 DIAGNOSIS — E119 Type 2 diabetes mellitus without complications: Secondary | ICD-10-CM | POA: Insufficient documentation

## 2012-01-05 DIAGNOSIS — J4489 Other specified chronic obstructive pulmonary disease: Secondary | ICD-10-CM | POA: Insufficient documentation

## 2012-01-05 DIAGNOSIS — F17200 Nicotine dependence, unspecified, uncomplicated: Secondary | ICD-10-CM | POA: Insufficient documentation

## 2012-01-05 DIAGNOSIS — Z8249 Family history of ischemic heart disease and other diseases of the circulatory system: Secondary | ICD-10-CM | POA: Insufficient documentation

## 2012-01-05 MED ORDER — TECHNETIUM TC 99M TETROFOSMIN IV KIT
32.9000 | PACK | Freq: Once | INTRAVENOUS | Status: AC | PRN
  Administered 2012-01-05: 32.9 via INTRAVENOUS

## 2012-01-05 MED ORDER — TECHNETIUM TC 99M TETROFOSMIN IV KIT
11.0000 | PACK | Freq: Once | INTRAVENOUS | Status: AC | PRN
  Administered 2012-01-05: 11 via INTRAVENOUS

## 2012-01-05 NOTE — Progress Notes (Signed)
[facility] SITE 3 NUCLEAR MED [address] [place] [address] [phone]  Cardiology Nuclear Med Study  Neil Carroll is a 61 y.o. male     MRN : [id number]     DOB: Nov 10, 1950  Procedure Date: 01/05/2012  Nuclear Med Background Indication for Stress Test:  Evaluation for Ischemia and Post Hospital on 12/23/11 with A-Flutter-RVR,(-) enzymes and evaluate decreased EF History:  COPD and '03 [id number]:[id number], EF=63%; 12/28/11 A. Flutter Ablation; 12/24/11 Echo:EF=45-50% Cardiac Risk Factors: Family History - CAD, Hypertension, Lipids, NIDDM, Obesity and Smoker  Symptoms:  Chest Tightness with and without Exertion (last episode of chest discomfort:none since discharge), Diaphoresis, DOE/SOB Palpitations and Rapid HR    Nuclear Pre-Procedure Caffeine/Decaff Intake:  None NPO After: 9:30pm   Lungs:  Clear. IV 0.9% NS with Angio Cath:  22g  IV Site: R Hand  IV Started by:  Cathlyn Parsons, RN  Chest Size (in):  50 Cup Size: n/a  Height: 5\' 11"  (1.803 m)  Weight:  255 lb (115.667 kg)  BMI:  Body mass index is 35.57 kg/(m^2). Tech Comments:  Metoprolol held x 24 hrs    Nuclear Med Study 1 or 2 day study: 1 day  Stress Test Type:  Stress  Reading MD: Olga Millers, MD  Order Authorizing Provider:  Lewayne Bunting, MD  Resting Radionuclide: Technetium 18m Tetrofosmin  Resting Radionuclide Dose: 11.0 mCi   Stress Radionuclide:  Technetium 39m Tetrofosmin  Stress Radionuclide Dose: 32.9 mCi           Stress Protocol Rest HR: 89 Stress HR: 196  Rest BP: Sitting 124/60; Standing 92/74 Stress BP: 210/68  Exercise Time (min): 4:53 METS: 5.5   Predicted Max HR: 159 bpm % Max HR: 123.27 bpm Rate Pressure Product: 46962   Dose of Adenosine (mg):  n/a Dose of Lexiscan: n/a mg  Dose of Atropine (mg): n/a Dose of Dobutamine: n/a mcg/kg/min (at max HR)  Stress Test Technologist: Smiley Houseman, CMA-N  Nuclear Technologist:  Domenic Polite, CNMT     Rest Procedure:   Myocardial perfusion imaging was performed at rest 45 minutes following the intravenous administration of Technetium 61m Tetrofosmin.  Rest ECG: Nonspecific T-wave changes and occasional PAC's.  Stress Procedure:  The patient exercised on the treadmill utilizing the Bruce  Protocol for 4:53 minutes. He then stopped due to SVT; he also c/o a little chest tightness at peak exercise.  He had a hypertensive response to exercise and his O2 level dropped to 84% at peak exercise.  There were ST-T wave changes, runs of SVT, occasional PAC's and PVC's.  Technetium 43m Tetrofosmin was injected at peak exercise and myocardial perfusion imaging was performed after a brief delay.  Patient was in and out of SVT, but 11:29 minutes into recovery he sustained the SVT without recovery from valsalva movements and Dr. Ladona Ridgel was notified.  At the time Dr. Ladona Ridgel was present the patient's rhythm had slowed to 108 from 160.  Dr. Ladona Ridgel advised checking images and he will follow up with patient.  Stress ECG: Significant ST abnormalities consistent with ischemia.  QPS Raw Data Images:  Acquisition technically good; normal left ventricular size. Stress Images:  There is decreased uptake in the inferior wall. Rest Images:  There is decreased uptake in the inferior wall, less prominent compared to stress images. Subtraction (SDS):  These findings are consistent with inferior thinning and mild inferior ischemia. Transient Ischemic Dilatation (Normal <1.22):  0.97 Lung/Heart Ratio (Normal <0.45):  0.32  Quantitative Gated Spect Images QGS EDV:  NA QGS ESV: NA  Impression Exercise Capacity:  Poor exercise capacity. BP Response:  Hypertensive blood pressure response. Clinical Symptoms:  Patient developed chest tightness. ECG Impression:  Significant ST abnormalities consistent with ischemia; ST changes occurred with bursts of SVT that occurred in recovery. Comparison with Prior Nuclear Study: Inferior ischemia new  compared to previous  Overall Impression:  Abnormal stress nuclear study with a small, moderate intensity, partially reversible inferior defect consistent with inferior thinning and mild inferior ischemia.  LV Ejection Fraction: Study not gated.  LV Wall Motion:  NA  Olga Millers

## 2012-01-20 ENCOUNTER — Other Ambulatory Visit: Payer: Self-pay | Admitting: Internal Medicine

## 2012-02-03 ENCOUNTER — Ambulatory Visit (INDEPENDENT_AMBULATORY_CARE_PROVIDER_SITE_OTHER): Payer: Federal, State, Local not specified - PPO | Admitting: Internal Medicine

## 2012-02-03 VITALS — BP 122/80 | HR 76 | Ht 71.0 in | Wt 262.0 lb

## 2012-02-03 DIAGNOSIS — I4892 Unspecified atrial flutter: Secondary | ICD-10-CM

## 2012-02-03 DIAGNOSIS — I10 Essential (primary) hypertension: Secondary | ICD-10-CM

## 2012-02-03 NOTE — Progress Notes (Signed)
HPI Neil Carroll returns today for followup. He is a pleasant 61 yo man with a h/o atrial flutter s/p catheter ablation in 2011. He did well initially but developed recurrent atrial flutter and underwent redo ablation several months ago. He denies recurrent chest pain or sob. No syncope.  Allergies  Allergen Reactions  . Niacin Other (See Comments)    REACTION: upset stomach     Current Outpatient Prescriptions  Medication Sig Dispense Refill  . acitretin (SORIATANE) 25 MG capsule Take 25 mg by mouth daily before breakfast.      . ADVAIR DISKUS 100-50 MCG/DOSE AEPB inhale 1 dose by mouth twice a day  60 each  5  . ammonium lactate (AMLACTIN) 12 % cream Apply topically as needed.      . Calcitriol (VECTICAL) 3 MCG/GM cream Apply topically at bedtime.      . cilostazol (PLETAL) 100 MG tablet Take 100 mg by mouth 2 (two) times daily.      . cilostazol (PLETAL) 100 MG tablet take 1 tablet by mouth twice a day  60 tablet  5  . Flaxseed, Linseed, (FLAX SEED OIL) 1000 MG CAPS Take 1 capsule by mouth daily.        . halobetasol (ULTRAVATE) 0.05 % cream Apply topically 2 (two) times daily.      [name] ketoconazole (NIZORAL) 2 % cream Apply topically 2 (two) times daily.        [name] losartan (COZAAR) 100 MG tablet Take 100 mg by mouth daily.      . metformin (FORTAMET) 1000 MG (OSM) 24 hr tablet take 1 tablet by mouth once daily  90 tablet  1  . metoprolol (LOPRESSOR) 100 MG tablet Take 1 tablet (100 mg total) by mouth 2 (two) times daily.  60 tablet  6  . mupirocin ointment (BACTROBAN) 2 % Apply 1 application topically 2 (two) times daily.      . Omega-3 Fatty Acids (FISH OIL) 1000 MG CAPS Take 2 capsules by mouth daily.        . rosuvastatin (CRESTOR) 20 MG tablet Take 20 mg by mouth daily.      [name] triamcinolone cream (KENALOG) 0.1 % Apply topically 2 (two) times daily.      . dabigatran (PRADAXA) 150 MG CAPS Take 1 capsule (150 mg total) by mouth every 12 (twelve) hours.      . Fluticasone-Salmeterol  (ADVAIR) 100-50 MCG/DOSE AEPB Inhale 1 puff into the lungs every 12 (twelve) hours.      [name] DISCONTD: metFORMIN (GLUMETZA) 1000 MG (MOD) 24 hr tablet Take 1,000 mg by mouth daily with breakfast.         Past Medical History  Diagnosis Date  . Other peripheral vascular disease     bilateral lower extremity  . Hyperlipidemia   . Hypertension   . Transient global amnesia   . Contact dermatitis and other eczema due to plants (except food)   . Tobacco use disorder     nondependent  . Atrial flutter     onset  2011. s/p EPS/RFA 12/2011  . Type II diabetes mellitus   . Chronic bronchitis   . GERD (gastroesophageal reflux disease)   . LV dysfunction     EF 45-50% 12/2011    ROS:   All systems reviewed and negative except as noted in the HPI.   Past Surgical History  Procedure Date  . Partial hip arthroplasty 01/2000    "Right; replaced ball & stem"  . Partial hip arthroplasty  01/2000    Right  . Cystoscopy 11/2007    Dr Annabell Howells  . Joint replacement   . Cardiac electrophysiology mapping and ablation 03/2010  . Tee without cardioversion 12/28/2011    Procedure: TRANSESOPHAGEAL ECHOCARDIOGRAM (TEE);  Surgeon: Laurey Morale, MD;  Location: [facility] ENDOSCOPY;  Service: Cardiovascular;  Laterality: N/A;     Family History  Problem Relation Age of Onset  . Stroke Mother 54  . Hypertension Mother   . Diabetes Mother   . Lung cancer Father   . Diabetes Paternal Grandmother   . Diabetes Brother     MI @ 8  . Diabetes Brother     Hepatitis C  . Throat cancer Paternal Uncle     1/2 uncle  . Heart attack Brother 54     History   Social History  . Marital Status: Single    Spouse Name: N/A    Number of Children: N/A  . Years of Education: N/A   Occupational History  . security guard    Social History Main Topics  . Smoking status: Current Everyday Smoker -- 1.5 packs/day for 44 years    Types: Cigarettes  . Smokeless tobacco: Never Used  . Alcohol Use: 0.0 oz/week      12/23/11 "2-3 drinks per year  . Drug Use: No  . Sexually Active: Not Currently   Other Topics Concern  . Not on file   Social History Narrative  . No narrative on file     BP 122/80  Pulse 76  Ht 5\' 11"  (1.803 m)  Wt 262 lb (118.842 kg)  BMI 36.54 kg/m2  Physical Exam:  Well appearing middle age man, NAD HEENT: Unremarkable Neck:  No JVD, no thyromegally Lungs:  Clear with no wheezes HEART:  Regular rate rhythm, no murmurs, no rubs, no clicks Abd:  soft, positive bowel sounds, no organomegally, no rebound, no guarding Ext:  2 plus pulses, no edema, no cyanosis, no clubbing Skin:  No rashes no nodules Neuro:  CN II through XII intact, motor grossly intact  EKG NSR  Assess/Plan:

## 2012-02-03 NOTE — Patient Instructions (Signed)
Your physician recommends that you schedule a follow-up appointment in: 3 months with Dr. Excell Seltzer.  Dr. Ladona Ridgel will see you back on an as needed basis.

## 2012-02-10 ENCOUNTER — Encounter: Payer: Self-pay | Admitting: Internal Medicine

## 2012-02-10 NOTE — Assessment & Plan Note (Signed)
He appears to be maintaining NSR. No change in medical therapy;. 

## 2012-02-10 NOTE — Assessment & Plan Note (Signed)
His blood pressure is well controlled. He will continue his current medical therapy. A low sodium diet is recommended 

## 2012-02-20 ENCOUNTER — Other Ambulatory Visit: Payer: Self-pay | Admitting: Cardiovascular Disease

## 2012-04-21 ENCOUNTER — Other Ambulatory Visit: Payer: Self-pay | Admitting: Internal Medicine

## 2012-05-05 ENCOUNTER — Ambulatory Visit: Payer: Federal, State, Local not specified - PPO | Admitting: Cardiovascular Disease

## 2012-05-16 ENCOUNTER — Encounter: Payer: Self-pay | Admitting: Cardiovascular Disease

## 2012-05-16 ENCOUNTER — Ambulatory Visit (INDEPENDENT_AMBULATORY_CARE_PROVIDER_SITE_OTHER): Payer: Federal, State, Local not specified - PPO | Admitting: Cardiovascular Disease

## 2012-05-16 VITALS — BP 140/80 | HR 74 | Ht 71.0 in | Wt 274.0 lb

## 2012-05-16 DIAGNOSIS — I7389 Other specified peripheral vascular diseases: Secondary | ICD-10-CM

## 2012-05-16 DIAGNOSIS — I4892 Unspecified atrial flutter: Secondary | ICD-10-CM

## 2012-05-16 DIAGNOSIS — I5032 Chronic diastolic (congestive) heart failure: Secondary | ICD-10-CM

## 2012-05-16 DIAGNOSIS — R9439 Abnormal result of other cardiovascular function study: Secondary | ICD-10-CM | POA: Insufficient documentation

## 2012-05-16 NOTE — Assessment & Plan Note (Signed)
Maintaining sinus rhythm after atrial flutter ablation.

## 2012-05-16 NOTE — Assessment & Plan Note (Signed)
Stress nuclear study from earlier this year reviewed. This was abnormal but low risk. He is not limited by anginal symptoms. We'll continue his current medical program with focus on lifestyle modification. I will see him back in 12 months and he understands to contact us if symptoms worsen.

## 2012-05-16 NOTE — Assessment & Plan Note (Signed)
Stable symptoms with no progression to rest pain. Continue with medical management. He is on appropriate therapy. He was counseled about smoking cessation.

## 2012-05-16 NOTE — Patient Instructions (Addendum)
Your physician wants you to follow-up in: 1 YEAR with Dr Cooper.  You will receive a reminder letter in the mail two months in advance. If you don't receive a letter, please call our office to schedule the follow-up appointment.  Your physician recommends that you continue on your current medications as directed. Please refer to the Current Medication list given to you today.  

## 2012-05-16 NOTE — Assessment & Plan Note (Signed)
New [place] Heart Association class II symptoms. We discussed the importance of a low-sodium diet to help manage his blood pressure and prevent him from developing problems with heart failure. He is at risk of heart failure with ongoing cigarettes, diabetes, and hypertension.

## 2012-05-16 NOTE — Progress Notes (Signed)
HPI:  Neil Carroll presents for followup evaluation. He is a 61 year old gentleman with history of atrial flutter status post 2 past ablations. He is followed by Dr. Ladona Ridgel. The patient is also followed for peripheral arterial disease with long-standing intermittent calf claudication. He had a stress nuclear scan in April 2013 demonstrating a small, moderate intensity, partially reversible defect in the inferior wall consistent with inferior thinning and mild ischemia.  The patient complains of chronic cough and stable dyspnea with exertion. He continues to smoke one pack of cigarettes daily. He coughs at nighttime. He denies orthopnea or PND. He does admit to ankle edema. He's had no chest pain or palpitations. He does feel a pressure sensation across his chest when he gets short of breath and he relates this to his breathing.  Outpatient Encounter Prescriptions as of 05/16/2012  Medication Sig Dispense Refill  . acitretin (SORIATANE) 25 MG capsule Take 25 mg by mouth daily before breakfast.      . ADVAIR DISKUS 100-50 MCG/DOSE AEPB inhale 1 dose by mouth twice a day  60 each  5  . ammonium lactate (AMLACTIN) 12 % cream Apply topically as needed.      . Calcitriol (VECTICAL) 3 MCG/GM cream Apply topically at bedtime.      . cilostazol (PLETAL) 100 MG tablet take 1 tablet by mouth twice a day  60 tablet  5  . CRESTOR 20 MG tablet take 1 tablet by mouth once daily  30 tablet  1  . Flaxseed, Linseed, (FLAX SEED OIL) 1000 MG CAPS Take 1 capsule by mouth daily.        . halobetasol (ULTRAVATE) 0.05 % cream Apply topically 2 (two) times daily.      [name] ketoconazole (NIZORAL) 2 % cream Apply topically 2 (two) times daily.        [name] losartan (COZAAR) 100 MG tablet Take 100 mg by mouth daily.      [name] losartan (COZAAR) 100 MG tablet take 1 tablet by mouth once daily  30 tablet  3  . metformin (FORTAMET) 1000 MG (OSM) 24 hr tablet take 1 tablet by mouth once daily  90 tablet  1  . metoprolol (LOPRESSOR) 100  MG tablet Take 1 tablet (100 mg total) by mouth 2 (two) times daily.  60 tablet  6  . mupirocin ointment (BACTROBAN) 2 % Apply 1 application topically 2 (two) times daily.      . Omega-3 Fatty Acids (FISH OIL) 1000 MG CAPS Take 2 capsules by mouth daily.        . rosuvastatin (CRESTOR) 20 MG tablet Take 20 mg by mouth daily.      [name] triamcinolone cream (KENALOG) 0.1 % Apply topically 2 (two) times daily.        Allergies  Allergen Reactions  . Niacin Other (See Comments)    REACTION: upset stomach    Past Medical History  Diagnosis Date  . Other peripheral vascular disease     bilateral lower extremity  . Hyperlipidemia   . Hypertension   . Transient global amnesia   . Contact dermatitis and other eczema due to plants (except food)   . Tobacco use disorder     nondependent  . Atrial flutter     onset  2011. s/p EPS/RFA 12/2011  . Type II diabetes mellitus   . Chronic bronchitis   . GERD (gastroesophageal reflux disease)   . LV dysfunction     EF 45-50% 12/2011    ROS: Negative  except as per HPI  BP 140/80  Pulse 74  Ht 5\' 11"  (1.803 m)  Wt 274 lb (124.286 kg)  BMI 38.22 kg/m2  PHYSICAL EXAM: Pt is alert and oriented, pleasant overweight male in NAD HEENT: normal Neck: JVP - normal, carotids 2+= without bruits Lungs: CTA bilaterally CV: RRR without murmur or gallop Abd: soft, NT, Positive BS, obese Ext: no C/C/E, pedal pulses diminished Skin: warm/changes of psoriasis noted  EKG:  Normal sinus rhythm 75 beats per minute, incomplete right bundle branch block, nonspecific T wave abnormality.  ASSESSMENT AND PLAN:

## 2012-06-20 ENCOUNTER — Other Ambulatory Visit: Payer: Self-pay | Admitting: Internal Medicine

## 2012-06-21 ENCOUNTER — Other Ambulatory Visit: Payer: Self-pay | Admitting: Cardiovascular Disease

## 2012-07-21 ENCOUNTER — Other Ambulatory Visit: Payer: Self-pay | Admitting: Internal Medicine

## 2012-07-21 NOTE — Telephone Encounter (Signed)
A1C 250.00 DUE, appointment 2-3 days later with [facility]

## 2012-07-27 ENCOUNTER — Other Ambulatory Visit (INDEPENDENT_AMBULATORY_CARE_PROVIDER_SITE_OTHER): Payer: Federal, State, Local not specified - PPO

## 2012-07-27 DIAGNOSIS — L408 Other psoriasis: Secondary | ICD-10-CM

## 2012-07-27 DIAGNOSIS — E119 Type 2 diabetes mellitus without complications: Secondary | ICD-10-CM

## 2012-07-27 LAB — CBC WITH DIFFERENTIAL/PLATELET
Eosinophils Absolute: 0.2 10*3/uL (ref 0.0–0.7)
Eosinophils Relative: 1.9 % (ref 0.0–5.0)
HCT: 45.3 % (ref 39.0–52.0)
Lymphs Abs: 2 10*3/uL (ref 0.7–4.0)
MCHC: 32.3 g/dL (ref 30.0–36.0)
MCV: 88.9 fl (ref 78.0–100.0)
Monocytes Absolute: 0.8 10*3/uL (ref 0.1–1.0)
Neutrophils Relative %: 63.3 % (ref 43.0–77.0)
Platelets: 182 10*3/uL (ref 150.0–400.0)
RDW: 14.3 % (ref 11.5–14.6)

## 2012-07-27 LAB — COMPREHENSIVE METABOLIC PANEL
ALT: 26 U/L (ref 0–53)
AST: 22 U/L (ref 0–37)
Albumin: 3.8 g/dL (ref 3.5–5.2)
Alkaline Phosphatase: 54 U/L (ref 39–117)
Potassium: 4 mEq/L (ref 3.5–5.1)
Sodium: 139 mEq/L (ref 135–145)
Total Bilirubin: 0.4 mg/dL (ref 0.3–1.2)
Total Protein: 7.5 g/dL (ref 6.0–8.3)

## 2012-07-27 LAB — TRIGLYCERIDES: Triglycerides: 109 mg/dL (ref 0.0–149.0)

## 2012-07-27 LAB — CHOLESTEROL, TOTAL: Cholesterol: 113 mg/dL (ref 0–200)

## 2012-08-01 ENCOUNTER — Encounter: Payer: Self-pay | Admitting: Internal Medicine

## 2012-08-01 ENCOUNTER — Telehealth: Payer: Self-pay | Admitting: Internal Medicine

## 2012-08-01 ENCOUNTER — Ambulatory Visit (INDEPENDENT_AMBULATORY_CARE_PROVIDER_SITE_OTHER): Payer: Federal, State, Local not specified - PPO | Admitting: Internal Medicine

## 2012-08-01 VITALS — BP 136/88 | HR 73 | Temp 98.5°F | Wt 274.6 lb

## 2012-08-01 DIAGNOSIS — F17200 Nicotine dependence, unspecified, uncomplicated: Secondary | ICD-10-CM

## 2012-08-01 DIAGNOSIS — L608 Other nail disorders: Secondary | ICD-10-CM

## 2012-08-01 DIAGNOSIS — I10 Essential (primary) hypertension: Secondary | ICD-10-CM

## 2012-08-01 DIAGNOSIS — L601 Onycholysis: Secondary | ICD-10-CM

## 2012-08-01 DIAGNOSIS — E119 Type 2 diabetes mellitus without complications: Secondary | ICD-10-CM

## 2012-08-01 DIAGNOSIS — E785 Hyperlipidemia, unspecified: Secondary | ICD-10-CM

## 2012-08-01 MED ORDER — GLIMEPIRIDE 2 MG PO TABS: 2.0000 mg | ORAL_TABLET | Freq: Every day | ORAL | Status: DC

## 2012-08-01 MED ORDER — METFORMIN HCL ER (OSM) 1000 MG PO TB24: 1000.0000 mg | ORAL_TABLET | Freq: Every day | ORAL | Status: DC

## 2012-08-01 NOTE — Progress Notes (Signed)
Subjective:    Patient ID: Neil Carroll, male    DOB: Aug 15, 1951, 61 y.o.   MRN: [id number]  HPI The patient is here for followup of diabetes in context of  hyperlipidemia and hypertension.  The most recent A1c 07/27/12 was 8.2% , which correlates to an average sugar of 189 , and long-term risk of  64% . Fasting blood sugar range is 110-159   .  Highest two-hour postprandial glucose is ? (record @ home)  . No hypoglycemia Last ophthalmologic examination 12/12   revealed no retinopathy. No active podiatry assessment on record. Diet : low salt  . Exercise : no .  The most recent lipids 11/6   reveal TC-113  and triglycerides- 109   . There is medical compliance with the statin-yes  Blood pressure range  or average is-doesn't take it There is medical compliance with antihypertensive medications- yes        Review of Systems Constitutional: No fever, chills, significant weight change, fatigue, weakness or night sweats Eyes: No  blurred vision, double vision, or loss of vision Cardiovascular: no chest pain, palpitations, racing, irregular rhythm,syncope,nausea,sweating, claudication, or edema  Respiratory: No exertinal dyspnea, paroxysmal nocturnal dyspnea Gastrointestinal: No heartburn,dysphagia, diarrhea, significant constipation, rectal bleeding, melena Genitourinary: No dysuria,hematuria, pyuria, frequency,  incontinence, nocturia Musculoskeletal: No myalgias or muscle cramping , weakness, or cyanosis Dermatologic: non healing ulcer on left leg Neurologic: No  limb weakness,  numbness or tingling Endocrine: No change in hair/skin/ nails, excessive thirst, excessive hunger, excessive urination, or unexplained fatigue      Objective:   Physical Exam Gen.: well-nourished in appearance. Alert and cooperative throughout exam.  Eyes: No corneal or conjunctival inflammation noted.  Mouth: Oral mucosa and oropharynx reveal no lesions or exudates. Teeth in good repair. Neck:  No deformities, masses, or tenderness noted. Range of motion normal. Thyroid- normal Lungs: Normal respiratory effort; chest expands symmetrically. Lungs decreased BS  without rales, wheezes, or increased work of breathing. Heart: Normal rate and rhythm. Normal S1 and S2. No gallop, click, or rub. Grade 1/2 systolic murmur. Abdomen: Bowel sounds normal; abdomen soft and nontender. No masses, organomegaly or hernias noted.                                                                          Musculoskeletal/extremities:  No clubbing, cyanosis, or deformity noted. Range of motion  normal .Tone & strength  normal.Joints normal. Chronic nail changes. Vascular: Carotid, radial artery, dorsalis pedis and  posterior tibial pulses are full and equal. No bruits present. Neurologic:  Deep tendon reflexes symmetrical and normal. Decreased sensation in  L dorsal foot & R sole          Skin: Scattered Psoriatic lesions Lymph: No cervical, axillary lymphadenopathy present. Psych: Mood and affect are normal. Normally interactive  Assessment & Plan:  1). Diabetes- uncontrolled. A1C-8.2. Start  Glimiperide 2 mg  2). Onchymycosis- Podiatry referral.

## 2012-08-01 NOTE — Assessment & Plan Note (Signed)
Renal function normal; blood pressure control appears to be adequate.

## 2012-08-01 NOTE — Assessment & Plan Note (Signed)
Lipids are at goal; liver function tests are normal. No change in Crestor recommended

## 2012-08-01 NOTE — Assessment & Plan Note (Signed)
A1c has increased from 6.8-8.2%; glimepiride 2 mg will be added. Nutritional intervention stressed

## 2012-08-01 NOTE — Telephone Encounter (Signed)
I called this in...

## 2012-08-01 NOTE — Telephone Encounter (Signed)
LM ON TRIAGE LINE 254pm per pt pharmacy did not get RX for Glimepiride, but did get Metoprolol, can you resend to pharmacy

## 2012-08-01 NOTE — Assessment & Plan Note (Signed)
Risk discussed; program options recommended

## 2012-08-01 NOTE — Patient Instructions (Addendum)
Eat a low-fat diet with lots of fruits and vegetables, up to 7-9 servings per day. Consume less than 40 (preferably ZERO) grams of sugar per day from foods & drinks with High Fructose Corn Syrup (HFCS) sugar as #1,2,3 or # 4 on label.Whole Foods, [employer] & [employer] do not carry products with HFCS. Follow a  low carb nutrition program such as [place] or [employer]  to prevent Diabetes progression . White carbohydrates (potatoes, rice, bread, and pasta) have a high spike of sugar and a high load of sugar. For example a  baked potato has a cup of sugar and a  french fry  2 teaspoons of sugar. Yams, wild  rice, whole grained bread &  wheat pasta have been much lower spike and load of  sugar. Portions should be the size of a deck of cards or your palm.  Please think about quitting smoking. Review the risks we discussed. Please call 1-800-QUIT-NOW [phone]) for free smoking cessation counseling.      Please  schedule Labs in 3 months : BUN, creat,A1c, urine microalbumin. PLEASE BRING THESE INSTRUCTIONS TO FOLLOW UP  LAB APPOINTMENT.This will guarantee correct labs are drawn, eliminating need for repeat blood sampling ( needle sticks ! ). Diagnoses /Codes: 250.02.   If you activate My Chart; the results can be released to you as soon as they populate from the lab. If you choose not to use this program; the labs have to be reviewed, copied & mailed   causing a delay in getting the results to you.

## 2012-08-16 ENCOUNTER — Other Ambulatory Visit: Payer: Self-pay | Admitting: Physician Assistant

## 2012-09-09 ENCOUNTER — Other Ambulatory Visit (HOSPITAL_COMMUNITY): Payer: Self-pay | Admitting: Cardiovascular Disease

## 2012-09-09 DIAGNOSIS — R0989 Other specified symptoms and signs involving the circulatory and respiratory systems: Secondary | ICD-10-CM

## 2012-09-09 DIAGNOSIS — I739 Peripheral vascular disease, unspecified: Secondary | ICD-10-CM

## 2012-10-10 ENCOUNTER — Encounter (HOSPITAL_COMMUNITY): Payer: Federal, State, Local not specified - PPO

## 2012-10-14 ENCOUNTER — Ambulatory Visit (HOSPITAL_COMMUNITY)
Admission: RE | Admit: 2012-10-14 | Discharge: 2012-10-14 | Disposition: A | Discharge status: Home / Self Care | Payer: Federal, State, Local not specified - PPO | Source: Ambulatory Visit | Attending: Cardiovascular Disease | Admitting: Cardiovascular Disease

## 2012-10-14 DIAGNOSIS — R0989 Other specified symptoms and signs involving the circulatory and respiratory systems: Secondary | ICD-10-CM

## 2012-10-14 DIAGNOSIS — I739 Peripheral vascular disease, unspecified: Secondary | ICD-10-CM | POA: Insufficient documentation

## 2012-10-14 NOTE — Progress Notes (Signed)
BLE Arterial Duplex Completed. Keric Zehren D  

## 2012-10-14 NOTE — Progress Notes (Signed)
Carotid Duplex Completed. Neil Carroll D  

## 2012-10-25 ENCOUNTER — Other Ambulatory Visit (INDEPENDENT_AMBULATORY_CARE_PROVIDER_SITE_OTHER): Payer: Federal, State, Local not specified - PPO

## 2012-10-25 DIAGNOSIS — IMO0001 Reserved for inherently not codable concepts without codable children: Secondary | ICD-10-CM

## 2012-10-25 LAB — MICROALBUMIN / CREATININE URINE RATIO: Microalb, Ur: 7.6 mg/dL — ABNORMAL HIGH (ref 0.0–1.9)

## 2012-10-25 LAB — BUN: BUN: 11 mg/dL (ref 6–23)

## 2012-11-18 ENCOUNTER — Other Ambulatory Visit: Payer: Self-pay | Admitting: Internal Medicine

## 2012-12-20 ENCOUNTER — Other Ambulatory Visit: Payer: Self-pay | Admitting: Internal Medicine

## 2012-12-20 NOTE — Telephone Encounter (Signed)
Lipid/Hep 272.4/995.20  

## 2012-12-26 ENCOUNTER — Telehealth: Payer: Self-pay | Admitting: *Deleted

## 2012-12-26 NOTE — Telephone Encounter (Signed)
Pt states that his son is currently in prison about 2 [place]/2 hours away. Pt indicated that he has a issue with leg swelling when takes this ride. Pt would like to know if dr hopp is willing to write a letter attesting to this in order to have his son moved closer to him.Please advise

## 2012-12-27 ENCOUNTER — Other Ambulatory Visit (INDEPENDENT_AMBULATORY_CARE_PROVIDER_SITE_OTHER): Payer: Federal, State, Local not specified - PPO

## 2012-12-27 DIAGNOSIS — T887XXA Unspecified adverse effect of drug or medicament, initial encounter: Secondary | ICD-10-CM

## 2012-12-27 DIAGNOSIS — E785 Hyperlipidemia, unspecified: Secondary | ICD-10-CM

## 2012-12-27 LAB — HEPATIC FUNCTION PANEL
Alkaline Phosphatase: 60 U/L (ref 39–117)
Bilirubin, Direct: 0.1 mg/dL (ref 0.0–0.3)
Total Bilirubin: 0.4 mg/dL (ref 0.3–1.2)
Total Protein: 7.6 g/dL (ref 6.0–8.3)

## 2012-12-27 LAB — LIPID PANEL
Cholesterol: 116 mg/dL (ref 0–200)
LDL Cholesterol: 64 mg/dL (ref 0–99)
Total CHOL/HDL Ratio: 4

## 2012-12-28 NOTE — Telephone Encounter (Signed)
Let me review his labs & med record ; I'll probably get it done this weekend

## 2012-12-28 NOTE — Telephone Encounter (Signed)
Discuss with patient  

## 2013-01-02 ENCOUNTER — Encounter: Payer: Self-pay | Admitting: Internal Medicine

## 2013-01-19 ENCOUNTER — Other Ambulatory Visit: Payer: Self-pay | Admitting: Internal Medicine

## 2013-02-19 ENCOUNTER — Other Ambulatory Visit: Payer: Self-pay | Admitting: Internal Medicine

## 2013-02-20 NOTE — Telephone Encounter (Signed)
TSH 244.9, A1c/MALB 250.00

## 2013-02-21 ENCOUNTER — Other Ambulatory Visit (INDEPENDENT_AMBULATORY_CARE_PROVIDER_SITE_OTHER): Payer: Federal, State, Local not specified - PPO

## 2013-02-21 DIAGNOSIS — E039 Hypothyroidism, unspecified: Secondary | ICD-10-CM

## 2013-02-21 LAB — MICROALBUMIN / CREATININE URINE RATIO
Microalb Creat Ratio: 1.1 mg/g (ref 0.0–30.0)
Microalb, Ur: 0.8 mg/dL (ref 0.0–1.9)

## 2013-02-21 LAB — TSH: TSH: 1.09 u[IU]/mL (ref 0.35–5.50)

## 2013-03-02 ENCOUNTER — Ambulatory Visit (INDEPENDENT_AMBULATORY_CARE_PROVIDER_SITE_OTHER): Payer: Federal, State, Local not specified - PPO | Admitting: Internal Medicine

## 2013-03-02 ENCOUNTER — Encounter: Payer: Self-pay | Admitting: Internal Medicine

## 2013-03-02 VITALS — BP 158/80 | HR 84 | Wt 281.0 lb

## 2013-03-02 DIAGNOSIS — I7389 Other specified peripheral vascular diseases: Secondary | ICD-10-CM

## 2013-03-02 DIAGNOSIS — IMO0001 Reserved for inherently not codable concepts without codable children: Secondary | ICD-10-CM

## 2013-03-02 DIAGNOSIS — F17200 Nicotine dependence, unspecified, uncomplicated: Secondary | ICD-10-CM

## 2013-03-02 DIAGNOSIS — I10 Essential (primary) hypertension: Secondary | ICD-10-CM

## 2013-03-02 MED ORDER — INSULIN DETEMIR 100 UNIT/ML FLEXPEN: 12.0000 [IU] | PEN_INJECTOR | Freq: Every day | SUBCUTANEOUS | Status: DC

## 2013-03-02 MED ORDER — INSULIN PEN NEEDLE 32G X 6 MM MISC: Status: DC

## 2013-03-02 NOTE — Progress Notes (Signed)
Subjective:    Patient ID: Neil Carroll, male    DOB: 09-30-1950, 62 y.o.   MRN: [id number]  HPI Diabetes status assessment: checks blood sugar 2-3 times per week Fasting or morning glucose range from 168-186 Highest glucose 2 hours after any meal is 143 No hypoglycemia reported                                                                                                                 No regular exercise. Nutrition/diet- has cut out 2/3 of soda consumption.  Has cut sodium and fried food consumption by 50% per pt. Medication compliance is good. No medication adverse effects noted. Eye exam current.  Last was Dec. 2013 Foot care- last appointment was in April 2014, next is in Oct. 2014 Foot exam every 6 months  A1c/ urine microalbumin monitor  02/21/13 : A1c 8.8%/ 0.8 (average sugar 209 with long-term 76%  risk )  HYPERTENSION: Home blood pressure range average not monitored  Patient is compliant with medications  No adverse effects noted from medication  No exercise program  Smoking 1 ppd        Review of Systems  No excess thirst ;  excess hunger ; or excess urination reported                              No lightheadedness with standing reported Claudication present when walking over 2 city blocks.  No chest pain ; palpitations described .                                                                                                                             Healing ulcers present on bilateral shins. Occasional tingling of  feet.  No numbness or burning in feet described.  6 pound weight gain since last visit.  Pt unsure of why gaining weight No blurred,double, or loss of vision reported  .         Objective:   Physical Exam  Appears  Adequately nourished & in no acute distress  No carotid bruits are present.No neck  pain distention present at 10 - 15 degrees. Thyroid normal to palpation  Heart rhythm and rate are normal ; Grade 1/2- 1 over 6 systolic murmur.  Scattered low grade rales/ rhonchi with no increased work of breathing  There is no evidence of aortic aneurysm or renal artery bruits  Abdomen soft with no organomegaly or masses. No HJR  No clubbing, cyanosis or edema present. Chronic toenail changes  Pedal pulses are decreased  Scattered psoriatic lesions  Affect flat  No ischemic skin changes are present .   Oriented. Strength, tone, DTRs reflexes normal         Assessment & Plan:  #1 diabetes, severely uncontrolled in the context of suboptimally controlled blood pressure, smoking, dyslipidemia, and peripheral vascular disease.  Plan: Basal insulin ordered for detoxification. Detailed discussion of risks and options completed.

## 2013-03-02 NOTE — Patient Instructions (Addendum)
Average your fasting sugars each week; increase the daily insulin dose by 2 units each day each week until the average fasting blood sugar is 140-160. At that time stop increasing the dose & repeat the A1c and urine microalbumin. A1c in 12 weeks; Code: 250.02.

## 2013-03-03 ENCOUNTER — Ambulatory Visit: Payer: Federal, State, Local not specified - PPO | Admitting: Internal Medicine

## 2013-03-20 ENCOUNTER — Other Ambulatory Visit: Payer: Self-pay | Admitting: Internal Medicine

## 2013-03-20 NOTE — Telephone Encounter (Signed)
Refill done.  

## 2013-04-21 ENCOUNTER — Other Ambulatory Visit: Payer: Self-pay | Admitting: Cardiovascular Disease

## 2013-04-21 ENCOUNTER — Other Ambulatory Visit: Payer: Self-pay | Admitting: Internal Medicine

## 2013-05-16 ENCOUNTER — Encounter: Payer: Self-pay | Admitting: Cardiovascular Disease

## 2013-05-16 ENCOUNTER — Ambulatory Visit (INDEPENDENT_AMBULATORY_CARE_PROVIDER_SITE_OTHER): Payer: Federal, State, Local not specified - PPO | Admitting: Cardiovascular Disease

## 2013-05-16 VITALS — BP 154/88 | HR 73 | Ht 71.0 in | Wt 271.0 lb

## 2013-05-16 DIAGNOSIS — I7389 Other specified peripheral vascular diseases: Secondary | ICD-10-CM

## 2013-05-16 DIAGNOSIS — I5032 Chronic diastolic (congestive) heart failure: Secondary | ICD-10-CM

## 2013-05-16 DIAGNOSIS — I10 Essential (primary) hypertension: Secondary | ICD-10-CM

## 2013-05-16 DIAGNOSIS — E785 Hyperlipidemia, unspecified: Secondary | ICD-10-CM

## 2013-05-16 MED ORDER — METOPROLOL TARTRATE 100 MG PO TABS: ORAL_TABLET | ORAL | Status: DC

## 2013-05-16 MED ORDER — HYDROCHLOROTHIAZIDE 25 MG PO TABS: 25.0000 mg | ORAL_TABLET | Freq: Every day | ORAL | Status: DC

## 2013-05-16 NOTE — Patient Instructions (Addendum)
Your physician has recommended you make the following change in your medication: START HCTZ 25mg  take one by mouth daily  Your physician recommends that you return for lab work in: 2 WEEKS (BMP)  [facility] Ul[facility]  Your physician wants you to follow-up in: 1 YEAR with Dr Excell Seltzer.  You will receive a reminder letter in the mail two months in advance. If you don't receive a letter, please call our office to schedule the follow-up appointment.

## 2013-05-16 NOTE — Progress Notes (Signed)
HPI:  62 year old gentleman presenting for followup evaluation. The patient has a history of atrial flutter status post redo frequency ablation, peripheral arterial disease, presumed CAD based on an abnormal stress Myoview,and ongoing tobacco.a nuclear scan in 2013 was abnormal but low risk and medical therapy was recommended.a carotid duplex from January showed less than 50% carotid stenosis bilaterally. Lower extremity Doppler studies showed bilateral SFA occlusive disease with ABIs in the moderate range bilaterally. Last lipids from September showed a cholesterol of 116, trig was read 114, HDL 29, and LDL 64.  The patient reports no major changes clinically. He's had no chest pain or pressure. He has chronic dyspnea with exertion. He has intermittent claudication and is limited to about 100-150 feet of walking. He's had no rest pain. He has developed some ulcers on the left shin. He has chronic swelling in his legs. Denies foot ulceration or rest pain. No orthopnea or PND. He continues to smoke over one pack per day. He reports compliance with his medications.    Outpatient Encounter Prescriptions as of 05/16/2013  Medication Sig Dispense Refill  . acitretin (SORIATANE) 25 MG capsule Take 25 mg by mouth daily before breakfast.      . ADVAIR DISKUS 100-50 MCG/DOSE AEPB inhale 1 dose by mouth twice a day  60 each  3  . ammonium lactate (AMLACTIN) 12 % cream Apply topically as needed.      [name] aspirin 325 MG tablet Take 325 mg by mouth daily.      . Calcitriol (VECTICAL) 3 MCG/GM cream Apply topically at bedtime.      . cilostazol (PLETAL) 100 MG tablet take 1 tablet by mouth twice a day  60 tablet  5  . CRESTOR 20 MG tablet take 1 tablet by mouth once daily (LABS DUE)  90 tablet  1  . Flaxseed, Linseed, (FLAX SEED OIL) 1000 MG CAPS Take 1 capsule by mouth 2 (two) times daily.       . halobetasol (ULTRAVATE) 0.05 % cream Apply topically 2 (two) times daily.      . Insulin Detemir (LEVEMIR FLEXPEN)  100 UNIT/ML SOPN Inject 12 Units into the skin daily.  3 mL  2  . Insulin Pen Needle (NOVOFINE) 32G X 6 MM MISC Use 1 needle daily with Levimir pen DX: 250.02  100 each  3  . ketoconazole (NIZORAL) 2 % cream Apply topically 2 (two) times daily.        [name] losartan (COZAAR) 100 MG tablet take 1 tablet by mouth once daily  30 tablet  9  . metformin (FORTAMET) 1000 MG (OSM) 24 hr tablet take 1 tablet by mouth once daily WITH BREAKFAST  30 tablet  6  . metoprolol (LOPRESSOR) 100 MG tablet take 1 tablet by mouth twice a day  180 tablet  2  . mupirocin ointment (BACTROBAN) 2 % Apply 1 application topically 2 (two) times daily.      . Omega-3 Fatty Acids (FISH OIL) 1000 MG CAPS Take 2 capsules by mouth daily.        [name] triamcinolone cream (KENALOG) 0.1 % Apply topically 2 (two) times daily.       No facility-administered encounter medications on file as of 05/16/2013.    Allergies  Allergen Reactions  . Niacin Other (See Comments)    REACTION: upset stomach    Past Medical History  Diagnosis Date  . Other peripheral vascular disease(443.89)     bilateral lower extremity  . Hyperlipidemia   .  Hypertension   . Transient global amnesia   . Contact dermatitis and other eczema due to plants (except food)   . Tobacco use disorder     nondependent  . Atrial flutter     onset  2011. s/p EPS/RFA 12/2011  . Type II diabetes mellitus   . Chronic bronchitis   . GERD (gastroesophageal reflux disease)   . LV dysfunction     EF 45-50% 12/2011    ROS: Negative except as per HPI  BP 154/88  Pulse 73  Ht 5\' 11"  (1.803 m)  Wt 271 lb (122.925 kg)  BMI 37.81 kg/m2  SpO2 98%  PHYSICAL EXAM: Pt is alert and oriented, NAD HEENT: normal Neck: JVP - normal, carotids 2+= withsoft bilateral bruits Lungs: CTA bilaterally CV: RRR without murmur or gallop Abd: soft, NT, Positive BS, no hepatomegaly Ext: 2+ pretibial edema, pretibial well-circumscribed ulcerations on the left lower leg, pedal pulses  nonpalpable.  EKG:  Sinus rhythm with rare PACs, incomplete right bundle branch block, nonspecific T wave abnormality.  ASSESSMENT AND PLAN: 1. CAD, native vessel. Based on abnormal but low risk Myoview scan. Continue medical therapy. No anginal symptoms. LVEF is preserved.  2. Peripheral arterial disease with intermittent claudication. ABIs and lower extremity duplex studies reviewed with bilateral SFA occlusive disease. He has long-standing intermittent claudication with no change in pattern. Will continue with medical therapy. He understands the importance of smoking cessation.  3. Hypertension with suboptimal control. Add hydrochlorothiazide 25 mg daily. Check a metabolic panel in 2 weeks.  4. Venous stasis ulceration. Refer to the wound care center.  5. Tobacco. Lengthy discussion today. The patient has multiple cardiovascular problems related to cigarettes. He will continue to decline with ongoing cigarette use and he understands this.  6. Hyperlipidemia. Managed with Crestor 20 mg. Lipids are at goal with an LDL of 64.  Tonny Bollman 05/16/2013 9:26 AM

## 2013-05-19 ENCOUNTER — Other Ambulatory Visit: Payer: Self-pay | Admitting: Internal Medicine

## 2013-05-19 NOTE — Telephone Encounter (Signed)
Rx sent to the pharmacy by e-script.//AB/CMA 

## 2013-05-25 ENCOUNTER — Telehealth: Payer: Self-pay | Admitting: Internal Medicine

## 2013-05-25 ENCOUNTER — Other Ambulatory Visit (INDEPENDENT_AMBULATORY_CARE_PROVIDER_SITE_OTHER): Payer: Federal, State, Local not specified - PPO

## 2013-05-25 DIAGNOSIS — IMO0001 Reserved for inherently not codable concepts without codable children: Secondary | ICD-10-CM

## 2013-05-25 LAB — HEMOGLOBIN A1C: Hgb A1c MFr Bld: 10.2 % — ABNORMAL HIGH (ref 4.6–6.5)

## 2013-05-25 LAB — MICROALBUMIN / CREATININE URINE RATIO: Microalb Creat Ratio: 3 mg/g (ref 0.0–30.0)

## 2013-05-25 NOTE — Telephone Encounter (Signed)
Patient says that he needs a prescription for Lancets and test strips. He has a One Touch Ultra. He says that he has not ordered any since 2009. Patient uses [facility] on [address]

## 2013-05-26 ENCOUNTER — Encounter: Payer: Self-pay | Admitting: *Deleted

## 2013-05-29 ENCOUNTER — Other Ambulatory Visit (INDEPENDENT_AMBULATORY_CARE_PROVIDER_SITE_OTHER): Payer: Federal, State, Local not specified - PPO

## 2013-05-29 DIAGNOSIS — E785 Hyperlipidemia, unspecified: Secondary | ICD-10-CM

## 2013-05-29 DIAGNOSIS — I7389 Other specified peripheral vascular diseases: Secondary | ICD-10-CM

## 2013-05-29 DIAGNOSIS — I10 Essential (primary) hypertension: Secondary | ICD-10-CM

## 2013-05-29 DIAGNOSIS — I5032 Chronic diastolic (congestive) heart failure: Secondary | ICD-10-CM

## 2013-05-29 LAB — BASIC METABOLIC PANEL
CO2: 25 mEq/L (ref 19–32)
Glucose, Bld: 283 mg/dL — ABNORMAL HIGH (ref 70–99)
Potassium: 3.6 mEq/L (ref 3.5–5.1)
Sodium: 135 mEq/L (ref 135–145)

## 2013-05-30 MED ORDER — ONETOUCH LANCETS MISC: Status: DC

## 2013-05-30 MED ORDER — GLUCOSE BLOOD VI STRP: ORAL_STRIP | Status: DC

## 2013-05-30 NOTE — Telephone Encounter (Signed)
Rx sent to the pharmacy by e-script.//AB/CMA 

## 2013-05-31 ENCOUNTER — Encounter (HOSPITAL_BASED_OUTPATIENT_CLINIC_OR_DEPARTMENT_OTHER): Payer: Federal, State, Local not specified - PPO | Attending: General Surgery

## 2013-05-31 DIAGNOSIS — I739 Peripheral vascular disease, unspecified: Secondary | ICD-10-CM | POA: Insufficient documentation

## 2013-05-31 DIAGNOSIS — E1169 Type 2 diabetes mellitus with other specified complication: Secondary | ICD-10-CM | POA: Insufficient documentation

## 2013-05-31 DIAGNOSIS — I10 Essential (primary) hypertension: Secondary | ICD-10-CM | POA: Insufficient documentation

## 2013-05-31 DIAGNOSIS — L97809 Non-pressure chronic ulcer of other part of unspecified lower leg with unspecified severity: Secondary | ICD-10-CM | POA: Insufficient documentation

## 2013-05-31 DIAGNOSIS — Z96649 Presence of unspecified artificial hip joint: Secondary | ICD-10-CM | POA: Insufficient documentation

## 2013-05-31 DIAGNOSIS — I4891 Unspecified atrial fibrillation: Secondary | ICD-10-CM | POA: Insufficient documentation

## 2013-06-01 NOTE — Progress Notes (Signed)
[facility]  NAME:  Neil, Carroll NO.:  [id number]  MEDICAL RECORD NO.:  [id number]      DATE OF BIRTH:  05/07/1951  PHYSICIAN:  Ardath Sax, M.D.           VISIT DATE:                                  OFFICE VISIT   Neil Carroll is a 62 year old diabetic man, who comes to [name] with a 1 cm diabetic foot ulcer on the lateral aspect of his left leg.  We debrided this of the slough and were treating it with Profore Lite and silver alginate.  I believe that this will heal very nicely.  I rank it as a Educational psychologist 2.  He is otherwise reasonably healthy.  His vital signs were 131/77 blood pressure, 16 respirations, pulse 75, temperature 98.5.  He weighs 165 pounds.  His blood sugar, however, was 364 this morning.  His past history reveals that he has had peripheral vascular disease for some time.  He also has atrial fibrillation, type 2 diabetes, left ventricular dysfunction.  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  His medicines include Calcitron, Advair inhaler, Nizoral, Cozaar, Lopressor, omega-3, and Crestor.  He should respond very well to our treatments as this is a superficial ulcer and it shows no evidence of infection and is only a cm in diameter.  We will see him back here in a week.  His diagnosis is diabetic foot ulcer, left leg, [place] 2.  Other diagnoses are type 2 diabetes, hypertension, atrial fibrillation.     Ardath Sax, M.D.     PP/MEDQ  D:  05/31/2013  T:  05/31/2013  Job:  [id number]

## 2013-06-16 ENCOUNTER — Encounter: Payer: Self-pay | Admitting: Internal Medicine

## 2013-06-16 ENCOUNTER — Ambulatory Visit (INDEPENDENT_AMBULATORY_CARE_PROVIDER_SITE_OTHER): Payer: Federal, State, Local not specified - PPO | Admitting: Internal Medicine

## 2013-06-16 VITALS — BP 129/73 | HR 97 | Temp 98.6°F | Resp 18 | Wt 267.0 lb

## 2013-06-16 DIAGNOSIS — IMO0002 Reserved for concepts with insufficient information to code with codable children: Secondary | ICD-10-CM

## 2013-06-16 DIAGNOSIS — L97909 Non-pressure chronic ulcer of unspecified part of unspecified lower leg with unspecified severity: Secondary | ICD-10-CM

## 2013-06-16 DIAGNOSIS — F17200 Nicotine dependence, unspecified, uncomplicated: Secondary | ICD-10-CM

## 2013-06-16 DIAGNOSIS — E1165 Type 2 diabetes mellitus with hyperglycemia: Secondary | ICD-10-CM

## 2013-06-16 DIAGNOSIS — J42 Unspecified chronic bronchitis: Secondary | ICD-10-CM

## 2013-06-16 NOTE — Assessment & Plan Note (Signed)
Advair bid Smoking cessation stressed

## 2013-06-16 NOTE — Assessment & Plan Note (Signed)
Titrate insulin ; refer to Dr Lucianne Muss  For adequate ulcer healing; diabetes will need to be controlled. Other risks related to infection short-term and long-term risk of CV eventsdiscussed

## 2013-06-16 NOTE — Progress Notes (Signed)
Subjective:    Patient ID: Neil Carroll, male    DOB: November 22, 1950, 62 y.o.   MRN: [id number]  HPI  Diabetes status assessment: Fasting or morning glucose range 206-240. Highest glucose 2 hours after any meal is not monitored. No hypoglycemia reported                                                                                                                 No regular exercise described . No specific nutrition/diet followed Medication compliance is good. No medication adverse effects noted. Eye exam not current. Foot care  current  A1c/ urine microalbumin monitor  05/25/13: 10.2 %/7.5 (average sugar 246 with long-term 104 %  risk )     Review of Systems Excess thirst  &  excess urination reported                              No lightheadedness with standing reported No chest pain or palpitations . Claudication described  After walking 150 feet.                                                                                                                             Ulcer LLE being treated [facility] (Dr Jimmey Ralph) . Intermittent  numbness , tingling , & burning in feet described                                                                                                                                             Change in weight of  9 pound loss. No blurred,double, or loss of vision reported  .           Objective:   Physical Exam Gen.:  Adequately nourished in appearance. Alert, appropriate and cooperative throughout exam.  Eyes: No corneal or conjunctival inflammation  noted.  Nose: External nasal exam reveals no deformity or inflammation. Nasal mucosa are pink and moist. No lesions or exudates noted. Septum  Deviated to R  Mouth: Oral mucosa and oropharynx reveal no lesions or exudates. Teeth in good repair. Neck: No deformities, masses, or tenderness noted.  Thyroid normal. Lungs: Normal respiratory effort; chest expands symmetrically. Homogenous low grade rales,  wheezes, & rhonchi  increased work of breathing. Heart: Normal ratew/o and rhythm. Normal S1 and S2. No gallop, click, or rub. S4 w/o murmur. Abdomen: Bowel sounds normal; abdomen soft and nontender. No masses, organomegaly or hernias noted.                                  Musculoskeletal/extremities:  Slight clubbing w/o cyanosis.  1/2 + edema noted. Tone & strength  Normal. Joints normal . Chronic toe & fingernail deformities. Able to lie down & sit up w/o help.  Vascular: Carotid, radial artery, dorsalis pedis and  posterior tibial pulses are  equal.  Pedal pulses decreased.No bruits present. Neurologic: Alert and oriented x3. Deep tendon reflexes symmetrical and normal.          Skin: LLE in  wrap from [facility]. Lymph: No cervical, axillary lymphadenopathy present. Psych: Mood and affect are normal. Normally interactive                                                                                        Assessment & Plan:  See Current Assessment & Plan in Problem List under specific Diagnosis

## 2013-06-16 NOTE — Patient Instructions (Addendum)
Verify the average fasting blood sugar each week based on 7 daily measurements. Increase the daily dose of insulin by 5 units each day every Monday if the fasting blood sugars are  averaging over 160. Continue this titration by 5 units daily each week  up to a max of 50 units.  Please think about quitting smoking. Review the risks we discussed. Please call 1-800-QUIT-NOW (417-832-5898) for free smoking cessation counseling.   Eat a low-fat diet with lots of fruits and vegetables, up to 7-9 servings per day. Consume less than 40  Grams (preferably ZERO) of sugar per day from foods & drinks with High Fructose Corn Syrup (HFCS) sugar as #1,2,3 or # 4 on label.[employer], [employer] & [employer] do not carry products with HFCS. Follow a  low carb nutrition program such as [place] or [employer]  to prevent Diabetes progression . White carbohydrates (potatoes, rice, bread, and pasta) have a high spike of sugar and a high load of sugar. For example a  baked potato has a cup of sugar and a  french fry  2 teaspoons of sugar. Yams, wild  rice, whole grained bread &  wheat pasta have been much lower spike and load of  sugar. Portions should be the size of a deck of cards or your palm.

## 2013-06-16 NOTE — Assessment & Plan Note (Signed)
Risks discussed 

## 2013-06-21 ENCOUNTER — Encounter (HOSPITAL_BASED_OUTPATIENT_CLINIC_OR_DEPARTMENT_OTHER): Payer: Federal, State, Local not specified - PPO | Attending: General Surgery

## 2013-06-21 DIAGNOSIS — I87319 Chronic venous hypertension (idiopathic) with ulcer of unspecified lower extremity: Secondary | ICD-10-CM | POA: Insufficient documentation

## 2013-06-21 DIAGNOSIS — L97909 Non-pressure chronic ulcer of unspecified part of unspecified lower leg with unspecified severity: Secondary | ICD-10-CM | POA: Insufficient documentation

## 2013-06-22 ENCOUNTER — Encounter: Payer: Self-pay | Admitting: Endocrinology

## 2013-06-22 ENCOUNTER — Ambulatory Visit (INDEPENDENT_AMBULATORY_CARE_PROVIDER_SITE_OTHER): Payer: Federal, State, Local not specified - PPO | Admitting: Endocrinology

## 2013-06-22 ENCOUNTER — Other Ambulatory Visit: Payer: Self-pay | Admitting: *Deleted

## 2013-06-22 VITALS — BP 122/60 | HR 78 | Temp 98.4°F | Resp 14 | Ht 71.0 in | Wt 268.1 lb

## 2013-06-22 DIAGNOSIS — I10 Essential (primary) hypertension: Secondary | ICD-10-CM

## 2013-06-22 DIAGNOSIS — E1165 Type 2 diabetes mellitus with hyperglycemia: Secondary | ICD-10-CM

## 2013-06-22 DIAGNOSIS — E785 Hyperlipidemia, unspecified: Secondary | ICD-10-CM

## 2013-06-22 MED ORDER — INSULIN PEN NEEDLE 32G X 6 MM MISC: Status: DC

## 2013-06-22 MED ORDER — LIRAGLUTIDE 18 MG/3ML ~~LOC~~ SOPN: 1.2000 mg | PEN_INJECTOR | Freq: Every day | SUBCUTANEOUS | Status: DC

## 2013-06-22 NOTE — Patient Instructions (Signed)
Increased Levemir to 26 units daily in the morning  Start VICTOZA injection with the sample pen once daily at the same time of the day preferably at bedtime.  Dial the dose to 0.6 mg for the first week.  You may  experience nausea in the first few days which usually gets better the After 1 week increase the dose to 1.2mg  daily if no nausea.  You may inject in the stomach, thigh or arm.   You will feel fullness of the stomach with starting the medication and should try to keep portions of food small.    Increase metformin ER to twice a day at mealtimes  Avoid foods with high sugar or carbohydrate content, drinks with sugar and fried/greasy food  Start walking 10 minutes daily for the first few days then increase as tolerated up to 30 minutes daily  Please check blood sugars at least half the time about 2 hours after any meal and as directed on waking up.   Please bring blood sugar monitor to each visit  You will be scheduled for diabetes education

## 2013-06-22 NOTE — Progress Notes (Signed)
Patient ID: Neil Carroll, male   DOB: 10-Sep-1951, 62 y.o.   MRN: [id number]    Reason for Appointment : Consultation for Type 2 Diabetes  History of Present Illness          Diagnosis: Type 2 diabetes mellitus, date of diagnosis:    2008     Past history: He has been treated with metformin only for several years and had relatively good control until about a year ago. However had been taking only 1000 mg of metformin  Recent history: Because of his progressively higher blood sugars  he was instructed in 6/14 on using Levemir insulin once a day in the morning in addition to his low dose metformin. He has increased the dose gradually but his blood sugars are still mostly over 200. Checking blood sugars mostly before breakfast. He does not complain of any unusual thirst or excessive urination Because of his A1c going up further he has been referred for consultation now   INSULIN regimen is described as: 22 in am for 2 day pen  Glucose monitoring:  done one time a day         Glucometer: One Touch.      Blood Glucose readings from meter download: readings before breakfast: 206-262, PC breakfast 400, p.c. lunch 215         Hypoglycemia frequency: Never.           Self-care: The diet that the patient has been following is: None  Meals: 3 meals per day. He is eating out only occasionally, may go to fast food restaurants  Physical activity: exercise:.none, not motivated           Dietician visit: Most recent: Probably never                 Oral hypoglycemic drugs the patient is taking are: Metformin Side effects from medications have been: None Compliance with the medical regimen: Poor  Retinal exam: Most recent: 2 years ago.    Component     Latest Ref Rng 08/30/2008 02/27/2009 05/06/2009 08/19/2009 12/31/2009  Hemoglobin A1C     4.6 - 6.5 % 6.5 (H) 6.6 (H) 6.6 (H) 6.6 (H) 6.6 (H)   Component     Latest Ref Rng 08/08/2010 01/20/2011 06/24/2011 12/23/2011 07/27/2012  Hemoglobin A1C     4.6  - 6.5 % 6.8 (H) 7.0 (H) 7.2 (H) 6.8 (H) 8.2 (H)   Component     Latest Ref Rng 10/25/2012 02/21/2013 05/25/2013  Hemoglobin A1C     4.6 - 6.5 % 7.9 (H) 8.8 (H) 10.2 (H)   Lab Results  Component Value Date   HGBA1C 10.2* 05/25/2013   Lab Results  Component Value Date   MICROALBUR 7.5* 05/25/2013       Medication List       This list is accurate as of: 06/22/13  3:57 PM.  Always use your most recent med list.               acitretin 25 MG capsule  Commonly known as:  SORIATANE  Take 25 mg by mouth daily before breakfast.     ADVAIR DISKUS 100-50 MCG/DOSE Aepb  Generic drug:  Fluticasone-Salmeterol  inhale 1 dose by mouth twice a day     ammonium lactate 12 % cream  Commonly known as:  AMLACTIN  Apply topically as needed.     aspirin 325 MG tablet  Take 325 mg by mouth daily.     cilostazol 100  MG tablet  Commonly known as:  PLETAL  take 1 tablet by mouth twice a day     CRESTOR 20 MG tablet  Generic drug:  rosuvastatin  take 1 tablet by mouth once daily (LABS DUE)     Fish Oil 1000 MG Caps  Take 2 capsules by mouth daily.     Flax Seed Oil 1000 MG Caps  Take 1 capsule by mouth 2 (two) times daily.     glucose blood test strip  Commonly known as:  ONE TOUCH ULTRA TEST  Blood Sugar tested daily.  Dx:250.02     halobetasol 0.05 % cream  Commonly known as:  ULTRAVATE  Apply topically 2 (two) times daily.     hydrochlorothiazide 25 MG tablet  Commonly known as:  HYDRODIURIL  Take 1 tablet (25 mg total) by mouth daily.     Insulin Detemir 100 UNIT/ML Sopn  Inject 22 Units into the skin daily.     Insulin Pen Needle 32G X 6 MM Misc  Commonly known as:  NOVOFINE  Use 1 needle daily with Levimir pen DX: 250.02     ketoconazole 2 % cream  Commonly known as:  NIZORAL  Apply topically 2 (two) times daily.     losartan 100 MG tablet  Commonly known as:  COZAAR  take 1 tablet by mouth once daily     metformin 1000 MG (OSM) 24 hr tablet  Commonly known as:   FORTAMET  take 1 tablet by mouth once daily WITH BREAKFAST     metoprolol 100 MG tablet  Commonly known as:  LOPRESSOR  take 1 tablet by mouth twice a day     mupirocin ointment 2 %  Commonly known as:  BACTROBAN  Apply 1 application topically 2 (two) times daily.     ONE TOUCH LANCETS Misc  OneTouch Delica 30G Sterile Lancets     triamcinolone cream 0.1 %  Commonly known as:  KENALOG  Apply topically 2 (two) times daily.     VECTICAL 3 MCG/GM cream  Generic drug:  Calcitriol  Apply topically at bedtime.        Allergies:  Allergies  Allergen Reactions  . Niacin Other (See Comments)    REACTION: upset stomach    Past Medical History  Diagnosis Date  . Other peripheral vascular disease(443.89)     bilateral lower extremity  . Hyperlipidemia   . Hypertension   . Transient global amnesia   . Contact dermatitis and other eczema due to plants (except food)   . Tobacco use disorder     nondependent  . Atrial flutter     onset  2011. s/p EPS/RFA 12/2011  . Type II diabetes mellitus   . Chronic bronchitis   . GERD (gastroesophageal reflux disease)   . LV dysfunction     EF 45-50% 12/2011    Past Surgical History  Procedure Laterality Date  . Partial hip arthroplasty  01/2000    "Right; replaced ball & stem"  . Partial hip arthroplasty  01/2000    Right  . Cystoscopy  11/2007    Dr Annabell Howells  . Joint replacement    . Cardiac electrophysiology mapping and ablation  03/2010  . Tee without cardioversion  12/28/2011    Procedure: TRANSESOPHAGEAL ECHOCARDIOGRAM (TEE);  Surgeon: Laurey Morale, MD;  Location: [facility] ENDOSCOPY;  Service: Cardiovascular;  Laterality: N/A;    Family History  Problem Relation Age of Onset  . Stroke Mother 31  . Hypertension Mother   .  Diabetes Mother   . Lung cancer Father   . Diabetes Paternal Grandmother   . Diabetes Brother     MI @ 54  . Diabetes Brother     Hepatitis C  . Throat cancer Paternal Uncle     1/2 uncle  . Heart attack  Brother 51    Social History:  reports that he has been smoking Cigarettes.  He has a 44 pack-year smoking history. He has never used smokeless tobacco. He reports that  drinks alcohol. He reports that he does not use illicit drugs.    Review of Systems       Lipids: He has persistently low HDL, LDL well controlled, taking Crestor            Skin: No rash.  He has had an ulcer on his left leg area, being treated by wound care Center      Thyroid:  No  unusual fatigue.     The blood pressure has been controlled with 3 drugs     Has had significant swelling of feet, only slightly better with adding hydrochlorothiazide by cardiologist .     No shortness of breath on exertion.     Bowel habits: Normal.       No frequency of urination or nocturia       No joint  pains.           Has occasional  history of Numbness, tingling  in the right first or second toes, no  burning in feet    LABS:  No visits with results within 1 Week(s) from this visit. Latest known visit with results is:  Appointment on 05/31/2013  Component Date Value Range Status  . Glucose-Capillary 05/31/2013 364* 70 - 99 mg/dL Final    Physical Examination:  BP 122/60  Pulse 78  Temp(Src) 98.4 F (36.9 C)  Resp 14  Ht 5\' 11"  (1.803 m)  Wt 268 lb 1.6 oz (121.609 kg)  BMI 37.41 kg/m2  SpO2 94%  GENERAL:         Patient has generalized obesity.   HEENT:         Eye exam shows normal external appearance. Fundus exam shows no retinopathy. Oral exam shows normal mucosa .  NECK:         General:  Neck exam shows no lymphadenopathy. Carotids are normal to palpation and no bruit heard. Thyroid is not enlarged and no nodules felt.   LUNGS:         Chest is symmetrical. Lungs are clear to auscultation.[name]   HEART:         Heart sounds:  S1 and S2 are normal. No murmurs or clicks heard., no S3 or S4.   ABDOMEN:         General:  There is no distention present. Liver and spleen are not palpable. No other mass or  tenderness present.  EXTREMITIES:     There is 2+ pedal edema. Left lower leg ulcer not examined, currently wrapped  NEUROLOGICAL:        Vibration sense is moderately reduced in toes. Ankle jerks are absent bilaterally.          Diabetic foot exam:  as in the foot exam section MUSCULOSKELETAL:       There is no enlargement or deformity of the joints. Spine is normal to inspection.[name]   PEDAL pulses: Absent SKIN:  has patchy rash on lower abdomen       ASSESSMENT:  Diabetes type 2, uncontrolled - 250.02    He has had progressive worsening of his diabetes control over the last year, prior treatment with metformin 1000 mg only Has not done much better with starting basal insulin only as glucose levels in the mornings are still over 200 and has occasional postprandial readings of 400  Problems identified: He has not been consistently watching diet. Has minimal knowledge of diabetic meal planning Has not been motivated to exercise or lose weight Currently checking blood sugars mostly in the morning and no postprandial readings Obesity and insulin resistance, will require relatively large doses of insulin including mealtime coverage Taking only half maximum dose of metformin  Complications: Mild signs of neuropathy  HYPERTENSION: Well controlled  Hyperlipidemia: Well controlled on Crestor  Dependent pedal edema, etiology not clear as he had no proteinuria or overt heart failure, probably venous edema  PLAN:   Increase metformin to twice a day Victoza 0.6 mg for the first week and then 1.2 mg. Discussed in detail how the medication works, benefits on various aspects of diabetes physiology, weight loss benefits, doses titration, possible side effects, safety, injection sites. Have demonstrated the flex pen and how to use it today  Increase Levemir to 26 units daily. Consider adding mealtime insulin or even V-go pump if he has significant postprandial hyperglycemia despite using  Victoza Reduce carbohydrates at breakfast and increased protein Diabetes education to be scheduled  Followup review of blood sugars in 2 weeks  Annual eye exam, he will wait until his blood sugars are stabilized to make an appointment  Total visit time including counseling = 60 minutes  Mahki Spikes 06/22/2013, 3:57 PM

## 2013-06-23 ENCOUNTER — Other Ambulatory Visit: Payer: Self-pay | Admitting: *Deleted

## 2013-06-23 MED ORDER — METFORMIN HCL ER (OSM) 1000 MG PO TB24: ORAL_TABLET | ORAL | Status: DC

## 2013-07-07 ENCOUNTER — Ambulatory Visit (INDEPENDENT_AMBULATORY_CARE_PROVIDER_SITE_OTHER): Payer: Federal, State, Local not specified - PPO | Admitting: Endocrinology

## 2013-07-07 ENCOUNTER — Encounter: Payer: Self-pay | Admitting: Endocrinology

## 2013-07-07 VITALS — BP 136/72 | HR 84 | Temp 98.6°F | Resp 12 | Ht 71.0 in | Wt 267.7 lb

## 2013-07-07 DIAGNOSIS — E785 Hyperlipidemia, unspecified: Secondary | ICD-10-CM

## 2013-07-07 DIAGNOSIS — E1165 Type 2 diabetes mellitus with hyperglycemia: Secondary | ICD-10-CM

## 2013-07-07 NOTE — Patient Instructions (Addendum)
Please check blood sugars at least half the time about 2 hours after any meal and 3x per week on waking up.   Please bring blood sugar monitor to each visit  Reduce starchy or hi fat foods, small portions  WALK DAILY 15-30 MIN

## 2013-07-07 NOTE — Progress Notes (Signed)
Patient ID: Neil Carroll, male   DOB: 01-Mar-1951, 62 y.o.   MRN: [id number]   Reason for Appointment : Followup for Type 2 Diabetes  History of Present Illness          Diagnosis: Type 2 diabetes mellitus, date of diagnosis:  2008     Past history: He has been treated with metformin only for several years and had relatively good control until about a year ago. However had been taking only 1000 mg of metformin. Because of his progressively higher blood sugars he has been on Levemir insulin since 6/14.   Recent history:  On his last visit because of persistently poor control with Levemir and metformin he was started on Victoza also. Previously blood sugars were mostly over 200. Metformin was increased to 1000 mg twice a day also. Checking blood sugars mostly before breakfast again despite discussion on the last visit.  Since starting Victoza his blood sugars have improved dramatically and are near normal now in the morning He thinks blood sugars may be higher on the days he has had more starch or high fat meal   INSULIN regimen is described as: 26 units in am since last visit  Glucose monitoring:  done one time a day         Glucometer: One Touch.      Blood Glucose readings from meter download: readings before breakfast: 114-160 recently Hypoglycemia frequency: Never.           Self-care: The diet that the patient has been following is: None  Meals: 3 meals per day. He is eating out only occasionally, may go to fast food restaurants  Physical activity: exercise:.none, not motivated           Dietician visit: Most recent: Probably never                 Oral hypoglycemic drugs the patient is taking are: Metformin Side effects from medications have been: None Compliance with the medical regimen: Improved  Retinal exam: Most recent: 2 years ago.    Lab Results  Component Value Date   HGBA1C 10.2* 05/25/2013   HGBA1C 8.8* 02/21/2013   HGBA1C 7.9* 10/25/2012   Lab Results  Component  Value Date   MICROALBUR 7.5* 05/25/2013   LDLCALC 64 12/27/2012   CREATININE 1.2 05/29/2013       Medication List       This list is accurate as of: 07/07/13 11:59 PM.  Always use your most recent med list.               acitretin 25 MG capsule  Commonly known as:  SORIATANE  Take 25 mg by mouth daily before breakfast.     ADVAIR DISKUS 100-50 MCG/DOSE Aepb  Generic drug:  Fluticasone-Salmeterol  inhale 1 dose by mouth twice a day     ammonium lactate 12 % cream  Commonly known as:  AMLACTIN  Apply topically as needed.     aspirin 325 MG tablet  Take 325 mg by mouth daily.     cilostazol 100 MG tablet  Commonly known as:  PLETAL  take 1 tablet by mouth twice a day     CRESTOR 20 MG tablet  Generic drug:  rosuvastatin  take 1 tablet by mouth once daily (LABS DUE)     Fish Oil 1000 MG Caps  Take 2 capsules by mouth daily.     Flax Seed Oil 1000 MG Caps  Take 1 capsule by mouth  2 (two) times daily.     glucose blood test strip  Commonly known as:  ONE TOUCH ULTRA TEST  Blood Sugar tested daily.  Dx:250.02     halobetasol 0.05 % cream  Commonly known as:  ULTRAVATE  Apply topically 2 (two) times daily.     hydrochlorothiazide 25 MG tablet  Commonly known as:  HYDRODIURIL  Take 1 tablet (25 mg total) by mouth daily.     Insulin Detemir 100 UNIT/ML Sopn  Inject 26 Units into the skin daily.     Insulin Pen Needle 32G X 6 MM Misc  Commonly known as:  NOVOFINE  Use 2 needles daily with Levimir pen and Victoza pen  DX: 250.02     ketoconazole 2 % cream  Commonly known as:  NIZORAL  Apply topically 2 (two) times daily.     Liraglutide 18 MG/3ML Sopn  Inject 1.2 mg into the skin daily.     losartan 100 MG tablet  Commonly known as:  COZAAR  take 1 tablet by mouth once daily     metformin 1000 MG (OSM) 24 hr tablet  Commonly known as:  FORTAMET  take 1 tablet by mouth 2 times a day     metoprolol 100 MG tablet  Commonly known as:  LOPRESSOR  take 1  tablet by mouth twice a day     mupirocin ointment 2 %  Commonly known as:  BACTROBAN  Apply 1 application topically 2 (two) times daily.     ONE TOUCH LANCETS Misc  OneTouch Delica 30G Sterile Lancets     triamcinolone cream 0.1 %  Commonly known as:  KENALOG  Apply topically 2 (two) times daily.     VECTICAL 3 MCG/GM cream  Generic drug:  Calcitriol  Apply topically at bedtime.        Allergies:  Allergies  Allergen Reactions  . Niacin Other (See Comments)    REACTION: upset stomach    Past Medical History  Diagnosis Date  . Other peripheral vascular disease(443.89)     bilateral lower extremity  . Hyperlipidemia   . Hypertension   . Transient global amnesia   . Contact dermatitis and other eczema due to plants (except food)   . Tobacco use disorder     nondependent  . Atrial flutter     onset  2011. s/p EPS/RFA 12/2011  . Type II diabetes mellitus   . Chronic bronchitis   . GERD (gastroesophageal reflux disease)   . LV dysfunction     EF 45-50% 12/2011    Past Surgical History  Procedure Laterality Date  . Partial hip arthroplasty  01/2000    "Right; replaced ball & stem"  . Partial hip arthroplasty  01/2000    Right  . Cystoscopy  11/2007    Dr Annabell Howells  . Joint replacement    . Cardiac electrophysiology mapping and ablation  03/2010  . Tee without cardioversion  12/28/2011    Procedure: TRANSESOPHAGEAL ECHOCARDIOGRAM (TEE);  Surgeon: Laurey Morale, MD;  Location: [facility] ENDOSCOPY;  Service: Cardiovascular;  Laterality: N/A;    Family History  Problem Relation Age of Onset  . Stroke Mother 110  . Hypertension Mother   . Diabetes Mother   . Lung cancer Father   . Diabetes Paternal Grandmother   . Diabetes Brother     MI @ 5  . Diabetes Brother     Hepatitis C  . Throat cancer Paternal Uncle     1/2 uncle  .  Heart attack Brother 52    Social History:  reports that he has been smoking Cigarettes.  He has a 44 pack-year smoking history. He has never  used smokeless tobacco. He reports that he drinks alcohol. He reports that he does not use illicit drugs.    Review of Systems       Lipids: He has persistently low HDL, LDL well controlled, taking Crestor, last LDL 64                  Has occasional  history of Numbness, tingling  in the right first or second toes, no  burning in feet    LABS:  Office Visit on 07/07/2013  Component Date Value Range Status  . Glucose 07/07/2013 149   Final     Physical Examination:  BP 136/72  Pulse 84  Temp(Src) 98.6 F (37 C)  Resp 12  Ht 5\' 11"  (1.803 m)  Wt 267 lb 11.2 oz (121.428 kg)  BMI 37.35 kg/m2  SpO2 91%     ASSESSMENT:  Diabetes type 2, uncontrolled - 250.02    He has had improved blood sugar control since starting Victoza and increasing his metformin and insulin. Previously had blood sugars over 200 consistently  Problems identified: Has minimal knowledge of diabetic meal planning, although he has cut back on portions he can still do better with meal planning Has not been motivated to exercise  Currently checking blood sugars mostly in the morning and no postprandial readings  Complications: Mild signs of neuropathy   PLAN:   Consultation with dietitian Continue same regimen but may need to adjust insulin based on fasting readings Start monitoring some readings after meals Discussed importance of starting walking He will try to do better with his diet, basic principles discussed   Aliz Meritt 07/11/2013, 10:51 AM

## 2013-08-16 ENCOUNTER — Other Ambulatory Visit: Payer: Self-pay | Admitting: *Deleted

## 2013-08-16 ENCOUNTER — Telehealth: Payer: Self-pay | Admitting: Endocrinology

## 2013-08-16 MED ORDER — INSULIN PEN NEEDLE 32G X 6 MM MISC: Status: DC

## 2013-08-20 ENCOUNTER — Other Ambulatory Visit: Payer: Self-pay | Admitting: Internal Medicine

## 2013-08-21 NOTE — Telephone Encounter (Signed)
Advair refilled per protocol

## 2013-09-01 ENCOUNTER — Other Ambulatory Visit (INDEPENDENT_AMBULATORY_CARE_PROVIDER_SITE_OTHER): Payer: Federal, State, Local not specified - PPO

## 2013-09-01 DIAGNOSIS — E785 Hyperlipidemia, unspecified: Secondary | ICD-10-CM

## 2013-09-01 DIAGNOSIS — IMO0002 Reserved for concepts with insufficient information to code with codable children: Secondary | ICD-10-CM

## 2013-09-01 DIAGNOSIS — E1165 Type 2 diabetes mellitus with hyperglycemia: Secondary | ICD-10-CM

## 2013-09-01 LAB — COMPREHENSIVE METABOLIC PANEL
Albumin: 4.1 g/dL (ref 3.5–5.2)
BUN: 11 mg/dL (ref 6–23)
CO2: 26 mEq/L (ref 19–32)
Calcium: 9.1 mg/dL (ref 8.4–10.5)
Chloride: 106 mEq/L (ref 96–112)
Creatinine, Ser: 1.3 mg/dL (ref 0.4–1.5)
GFR: 72.26 mL/min (ref >60.00)
Glucose, Bld: 113 mg/dL — ABNORMAL HIGH (ref 70–99)
Total Bilirubin: 0.5 mg/dL (ref 0.3–1.2)

## 2013-09-01 LAB — HEMOGLOBIN A1C: Hgb A1c MFr Bld: 8.6 % — ABNORMAL HIGH (ref 4.6–6.5)

## 2013-09-01 LAB — MICROALBUMIN / CREATININE URINE RATIO
Microalb Creat Ratio: 1.1 mg/g (ref 0.0–30.0)
Microalb, Ur: 3 mg/dL — ABNORMAL HIGH (ref 0.0–1.9)

## 2013-09-01 LAB — LIPID PANEL
Cholesterol: 95 mg/dL (ref 0–200)
HDL: 28.2 mg/dL — ABNORMAL LOW (ref >39.00)
Triglycerides: 124 mg/dL (ref 0.0–149.0)
VLDL: 24.8 mg/dL (ref 0.0–40.0)

## 2013-09-06 ENCOUNTER — Ambulatory Visit (INDEPENDENT_AMBULATORY_CARE_PROVIDER_SITE_OTHER): Payer: Federal, State, Local not specified - PPO | Admitting: Endocrinology

## 2013-09-06 ENCOUNTER — Encounter: Payer: Self-pay | Admitting: Endocrinology

## 2013-09-06 VITALS — BP 138/60 | HR 81 | Temp 98.5°F | Resp 12 | Ht 71.0 in | Wt 267.4 lb

## 2013-09-06 DIAGNOSIS — E785 Hyperlipidemia, unspecified: Secondary | ICD-10-CM

## 2013-09-06 DIAGNOSIS — E1165 Type 2 diabetes mellitus with hyperglycemia: Secondary | ICD-10-CM

## 2013-09-06 DIAGNOSIS — Z23 Encounter for immunization: Secondary | ICD-10-CM

## 2013-09-06 DIAGNOSIS — I10 Essential (primary) hypertension: Secondary | ICD-10-CM

## 2013-09-06 NOTE — Progress Notes (Signed)
Patient ID: Neil Carroll, male   DOB: March 18, 1951, 62 y.o.   MRN: [id number]  Reason for Appointment : Followup for Type 2 Diabetes  History of Present Illness          Diagnosis: Type 2 diabetes mellitus, date of diagnosis:  2008     Past history: He has been treated with metformin only for several years and had relatively good control until about a year ago. However had been taking only 1000 mg of metformin. Because of his progressively higher blood sugars he had been started on Levemir insulin since 6/14.  However with this his A1c was still 10.2 in 9/14 and he was referred here  Recent history:  Because of having persistently poor control with Levemir and metformin he was started on Victoza in 10/14. Previously blood sugars were mostly over 200. Metformin was increased to 1000 mg twice a day also. Since starting Victoza his blood sugars had improved dramatically and were near normal  in the morning His A1c has improved somewhat but still significantly high Checking blood sugars only before breakfast again despite repeated reminders and these are looking fairly good He has not increased his insulin He was referred for nutritional counseling but has not been able to be scheduled as yet  INSULIN regimen is described as:  Levemir 26 units in am  Oral hypoglycemic drugs the patient is taking are: Metformin Side effects from medications have been: None Compliance with the medical regimen: Improved   Glucose monitoring:  done one time a day         Glucometer: One Touch.      Blood Glucose readings from meter download: Fasting 107-167 with median 133. No nonfasting readings  Hypoglycemia frequency: Never.           Self-care: The diet that the patient has been following is: None  Meals: 3 meals per day. He is eating out only occasionally, may go to fast food restaurants  Physical activity: exercise: a little walking          Dietician visit: Most recent: Probably never                 Retinal exam: Most recent: 2 years ago.   Wt Readings from Last 3 Encounters:  09/06/13 267 lb 6.4 oz (121.292 kg)  07/07/13 267 lb 11.2 oz (121.428 kg)  06/22/13 268 lb 1.6 oz (121.609 kg)       Lab Results  Component Value Date   HGBA1C 8.6* 09/01/2013   HGBA1C 10.2* 05/25/2013   HGBA1C 8.8* 02/21/2013   Lab Results  Component Value Date   MICROALBUR 3.0* 09/01/2013   LDLCALC 42 09/01/2013   CREATININE 1.3 09/01/2013       Medication List       This list is accurate as of: 09/06/13 11:13 AM.  Always use your most recent med list.               acitretin 25 MG capsule  Commonly known as:  SORIATANE  Take 25 mg by mouth daily before breakfast.     ADVAIR DISKUS 100-50 MCG/DOSE Aepb  Generic drug:  Fluticasone-Salmeterol  inhale 1 dose by mouth twice a day     ammonium lactate 12 % cream  Commonly known as:  AMLACTIN  Apply topically as needed.     aspirin 325 MG tablet  Take 325 mg by mouth daily.     cilostazol 100 MG tablet  Commonly known as:  PLETAL  take 1 tablet by mouth twice a day     CRESTOR 20 MG tablet  Generic drug:  rosuvastatin  take 1 tablet by mouth once daily (LABS DUE)     Fish Oil 1000 MG Caps  Take 2 capsules by mouth daily.     Flax Seed Oil 1000 MG Caps  Take 1 capsule by mouth 2 (two) times daily.     glucose blood test strip  Commonly known as:  ONE TOUCH ULTRA TEST  Blood Sugar tested daily.  Dx:250.02     halobetasol 0.05 % cream  Commonly known as:  ULTRAVATE  Apply topically 2 (two) times daily.     hydrochlorothiazide 25 MG tablet  Commonly known as:  HYDRODIURIL  Take 1 tablet (25 mg total) by mouth daily.     Insulin Detemir 100 UNIT/ML Sopn  Inject 26 Units into the skin daily.     Insulin Pen Needle 32G X 6 MM Misc  Commonly known as:  NOVOFINE  Use 2 needles daily with Levimir pen and Victoza pen  DX: 250.02     ketoconazole 2 % cream  Commonly known as:  NIZORAL  Apply topically 2 (two) times daily.      Liraglutide 18 MG/3ML Sopn  Inject 1.2 mg into the skin daily.     losartan 100 MG tablet  Commonly known as:  COZAAR  take 1 tablet by mouth once daily     metformin 1000 MG (OSM) 24 hr tablet  Commonly known as:  FORTAMET  take 1 tablet by mouth 2 times a day     metoprolol 100 MG tablet  Commonly known as:  LOPRESSOR  take 1 tablet by mouth twice a day     mupirocin ointment 2 %  Commonly known as:  BACTROBAN  Apply 1 application topically 2 (two) times daily.     ONE TOUCH LANCETS Misc  OneTouch Delica 30G Sterile Lancets     triamcinolone cream 0.1 %  Commonly known as:  KENALOG  Apply topically 2 (two) times daily.     VECTICAL 3 MCG/GM cream  Generic drug:  Calcitriol  Apply topically at bedtime.        Allergies:  Allergies  Allergen Reactions  . Niacin Other (See Comments)    REACTION: upset stomach    Past Medical History  Diagnosis Date  . Other peripheral vascular disease(443.89)     bilateral lower extremity  . Hyperlipidemia   . Hypertension   . Transient global amnesia   . Contact dermatitis and other eczema due to plants (except food)   . Tobacco use disorder     nondependent  . Atrial flutter     onset  2011. s/p EPS/RFA 12/2011  . Type II diabetes mellitus   . Chronic bronchitis   . GERD (gastroesophageal reflux disease)   . LV dysfunction     EF 45-50% 12/2011    Past Surgical History  Procedure Laterality Date  . Partial hip arthroplasty  01/2000    "Right; replaced ball & stem"  . Partial hip arthroplasty  01/2000    Right  . Cystoscopy  11/2007    Dr Annabell Howells  . Joint replacement    . Cardiac electrophysiology mapping and ablation  03/2010  . Tee without cardioversion  12/28/2011    Procedure: TRANSESOPHAGEAL ECHOCARDIOGRAM (TEE);  Surgeon: Laurey Morale, MD;  Location: [facility] ENDOSCOPY;  Service: Cardiovascular;  Laterality: N/A;    Family History  Problem Relation Age of  Onset  . Stroke Mother 67  . Hypertension Mother    . Diabetes Mother   . Lung cancer Father   . Diabetes Paternal Grandmother   . Diabetes Brother     MI @ 67  . Diabetes Brother     Hepatitis C  . Throat cancer Paternal Uncle     1/2 uncle  . Heart attack Brother 18    Social History:  reports that he has been smoking Cigarettes.  He has a 44 pack-year smoking history. He has never used smokeless tobacco. He reports that he drinks alcohol. He reports that he does not use illicit drugs.    Review of Systems       Lipids: He has persistently low HDL, LDL well controlled, taking Crestor, last LDL 64                  Has occasional  history of Numbness, tingling  in the right first or second toes, no  burning in feet    LABS:  Appointment on 09/01/2013  Component Date Value Range Status  . Sodium 09/01/2013 140  135 - 145 mEq/L Final  . Potassium 09/01/2013 4.2  3.5 - 5.1 mEq/L Final  . Chloride 09/01/2013 106  96 - 112 mEq/L Final  . CO2 09/01/2013 26  19 - 32 mEq/L Final  . Glucose, Bld 09/01/2013 113* 70 - 99 mg/dL Final  . BUN 16/06/9603 11  6 - 23 mg/dL Final  . Creatinine, Ser 09/01/2013 1.3  0.4 - 1.5 mg/dL Final  . Total Bilirubin 09/01/2013 0.5  0.3 - 1.2 mg/dL Final  . Alkaline Phosphatase 09/01/2013 47  39 - 117 U/L Final  . AST 09/01/2013 15  0 - 37 U/L Final  . ALT 09/01/2013 18  0 - 53 U/L Final  . Total Protein 09/01/2013 7.5  6.0 - 8.3 g/dL Final  . Albumin 54/05/8118 4.1  3.5 - 5.2 g/dL Final  . Calcium 14/78/2956 9.1  8.4 - 10.5 mg/dL Final  . GFR 21/30/8657 72.26  >60.00 mL/min Final  . Cholesterol 09/01/2013 95  0 - 200 mg/dL Final   ATP III Classification       Desirable:  < 200 mg/dL               Borderline High:  200 - 239 mg/dL          High:  > = 846 mg/dL  . Triglycerides 09/01/2013 124.0  0.0 - 149.0 mg/dL Final   Normal:  <962 mg/dLBorderline High:  150 - 199 mg/dL  . HDL 09/01/2013 28.20* >39.00 mg/dL Final  . VLDL 95/28/4132 24.8  0.0 - 40.0 mg/dL Final  . LDL Cholesterol 09/01/2013 42  0 -  99 mg/dL Final  . Total CHOL/HDL Ratio 09/01/2013 3   Final                  Men          Women1/2 Average Risk     3.4          3.3Average Risk          5.0          4.42X Average Risk          9.6          7.13X Average Risk          15.0          11.0                      .  Microalb, Ur 09/01/2013 3.0* 0.0 - 1.9 mg/dL Final  . Creatinine,U 16/06/9603 271.7   Final  . Microalb Creat Ratio 09/01/2013 1.1  0.0 - 30.0 mg/g Final  . Hemoglobin A1C 09/01/2013 8.6* 4.6 - 6.5 % Final   Glycemic Control Guidelines for People with Diabetes:Non Diabetic:  <6%Goal of Therapy: <7%Additional Action Suggested:  >8%      Physical Examination:  BP 138/60  Pulse 81  Temp(Src) 98.5 F (36.9 C)  Resp 12  Ht 5\' 11"  (1.803 m)  Wt 267 lb 6.4 oz (121.292 kg)  BMI 37.31 kg/m2  SpO2 95%     ASSESSMENT:  Diabetes type 2, uncontrolled - 250.02    He has had improved blood sugar control since starting Victoza and increasing his metformin and insulin. However he may be having postprandial hyperglycemia since his A1c is significantly high despite having fairly good morning readings  Problems identified: Has minimal knowledge of diabetic meal planning, although he has cut back on portions he needs formal counseling Has not been motivated to exercise regularly although is starting to do some now Currently checking blood sugars only in the morning and no postprandial readings  HYPERLIPIDEMIA: He does not have CAD but is on high dose statin treatment. LDL is only 42, may be able to reduce his dose of Crestor, deferred to PCP  Preventive care: He has not had a Pneumovax, discussed benefits especially with his having diabetes and will administer this today  PLAN:   Consultation with dietitian Continue same regimen but may need to consider mealtime insulin if having postprandial hyperglycemia, he will call Start monitoring some readings after meals especially supper  Elishua Radford 09/06/2013, 11:13 AM

## 2013-09-06 NOTE — Patient Instructions (Addendum)
Please check blood sugars at least half the time about 2 hours after any meal and as directed on waking up.  Call if after meal readings > 200  Please bring blood sugar monitor to each visit  Walk daily

## 2013-09-18 ENCOUNTER — Other Ambulatory Visit: Payer: Self-pay | Admitting: Internal Medicine

## 2013-09-18 NOTE — Telephone Encounter (Signed)
Crestor refilled per protocol. JG//CMA 

## 2013-09-26 ENCOUNTER — Encounter: Payer: Federal, State, Local not specified - PPO | Attending: Endocrinology | Admitting: *Deleted

## 2013-09-26 ENCOUNTER — Encounter: Payer: Self-pay | Admitting: *Deleted

## 2013-09-26 VITALS — Ht 71.0 in | Wt 269.3 lb

## 2013-09-26 DIAGNOSIS — E1165 Type 2 diabetes mellitus with hyperglycemia: Secondary | ICD-10-CM

## 2013-09-26 DIAGNOSIS — E1169 Type 2 diabetes mellitus with other specified complication: Secondary | ICD-10-CM

## 2013-09-26 DIAGNOSIS — IMO0002 Reserved for concepts with insufficient information to code with codable children: Secondary | ICD-10-CM

## 2013-09-26 DIAGNOSIS — IMO0001 Reserved for inherently not codable concepts without codable children: Secondary | ICD-10-CM | POA: Insufficient documentation

## 2013-09-26 DIAGNOSIS — Z713 Dietary counseling and surveillance: Secondary | ICD-10-CM | POA: Insufficient documentation

## 2013-09-26 NOTE — Patient Instructions (Signed)
Plan:  Aim for 4-5 Carb Choices per meal (60-75 grams) +/- 1 either way  Aim for 0-2 Carbs per snack if hungry  Consider reading food labels for Total Carbohydrate of foods Continue checking BG at alternate times per day as directed by MD  Consider taking Levemir insulin in early AM rather than 9 AM to try and bring your fasting BG into range

## 2013-09-26 NOTE — Progress Notes (Signed)
  Medical Nutrition Therapy:  Appt start time: 0915 end time:  7824.  Assessment:  Primary concerns today: diabetes nutrition education. Lives with male friend and his 64 yo grand daughter. They share the food shopping, and food preparation. No previous diabetes education. 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 He doesn't have anything to do now. Not much activity, states he is trying to walk or work in the yard some. SMBG once a day more or less, including post meal times. States he takes Levemir about 9 AM every day, Victoza at night. States he is consistent in taking these medications.  Preferred Learning Style:   No preference indicated   Learning Readiness:   Contemplating  MEDICATIONS: see list. Diabetes medications are Levemir, Metformin and Victoza    DIETARY INTAKE:  24-hr recall:  B (9 AM): 4-5 pkgs flavored oatmeal, banana, OR twice a week, eggs, with bacon or sausage, 2 slices plain wheat bread, coffee with cream  and 1 1/2 tsp sugar Snk ( AM): no  L ( 2 PM): left overs from night before, water or regular Soda Snk ( PM): no D ( 8 PM): meat, starch including beans or sweet potatoes, occasionally a vegetables, 3-4 pieces of bread, water or regular soda Snk ( PM): not usually unless peanut M&M's OR ice cream with chocolate syrup Beverages: coffee, water or regular soda   Usual physical activity: Not much activity, trying to walk or work in the yard some   Estimated energy needs: 2000 calories 225 g carbohydrates 150 g protein 56 g fat  Intervention:  Nutrition counseling and diabetes education initiated. Discussed basic physiology of diabetes, SMBG and rationale of checking BG at alternate times of day, A1c, Carb Counting and reading food labels, and benefits of increased activity.  Plan:  Aim for 4-5 Carb Choices per meal (60-75 grams) +/- 1 either way  Aim for 0-2 Carbs per snack if hungry  Consider reading food labels for Total Carbohydrate of  foods Continue checking BG at alternate times per day as directed by MD  Consider taking Levemir insulin in early AM rather than 9 AM to try and bring your fasting BG into range    Teaching Method Utilized: all of the following: Visual, Auditory, and Hands on  Handouts given during visit include: Living Well with Diabetes Carb Counting and Food Label handouts Meal Plan Card  Barriers to learning/adherence to lifestyle change: none at this time  Demonstrated degree of understanding via:  Teach Back   Monitoring/Evaluation:  Dietary intake, exercise, reading food labels, SMBG, and body weight in 6 month(s).

## 2013-10-04 ENCOUNTER — Encounter: Payer: Self-pay | Admitting: *Deleted

## 2013-10-19 ENCOUNTER — Encounter: Payer: Self-pay | Admitting: Internal Medicine

## 2013-10-21 ENCOUNTER — Other Ambulatory Visit: Payer: Self-pay | Admitting: Internal Medicine

## 2013-10-23 NOTE — Telephone Encounter (Signed)
Levemir flextouch pen refilled. JG//CMA

## 2013-11-18 ENCOUNTER — Other Ambulatory Visit: Payer: Self-pay | Admitting: Internal Medicine

## 2013-11-23 ENCOUNTER — Ambulatory Visit (INDEPENDENT_AMBULATORY_CARE_PROVIDER_SITE_OTHER): Payer: Federal, State, Local not specified - PPO | Admitting: Internal Medicine

## 2013-11-23 ENCOUNTER — Other Ambulatory Visit (INDEPENDENT_AMBULATORY_CARE_PROVIDER_SITE_OTHER): Payer: Federal, State, Local not specified - PPO

## 2013-11-23 ENCOUNTER — Encounter: Payer: Self-pay | Admitting: Internal Medicine

## 2013-11-23 ENCOUNTER — Ambulatory Visit (INDEPENDENT_AMBULATORY_CARE_PROVIDER_SITE_OTHER)
Admission: RE | Admit: 2013-11-23 | Discharge: 2013-11-23 | Disposition: A | Discharge status: Home / Self Care | Payer: Federal, State, Local not specified - PPO | Source: Ambulatory Visit | Attending: Internal Medicine | Admitting: Internal Medicine

## 2013-11-23 VITALS — BP 130/68 | HR 99 | Temp 99.6°F | Resp 15 | Ht 71.25 in | Wt 267.1 lb

## 2013-11-23 DIAGNOSIS — Z860100 Personal history of colon polyps, unspecified: Secondary | ICD-10-CM

## 2013-11-23 DIAGNOSIS — Z0000 Encounter for general adult medical examination without abnormal findings: Secondary | ICD-10-CM

## 2013-11-23 DIAGNOSIS — IMO0002 Reserved for concepts with insufficient information to code with codable children: Secondary | ICD-10-CM

## 2013-11-23 DIAGNOSIS — E1165 Type 2 diabetes mellitus with hyperglycemia: Secondary | ICD-10-CM

## 2013-11-23 DIAGNOSIS — E1169 Type 2 diabetes mellitus with other specified complication: Secondary | ICD-10-CM

## 2013-11-23 LAB — CBC WITH DIFFERENTIAL/PLATELET
BASOS ABS: 0 10*3/uL (ref 0.0–0.1)
BASOS PCT: 0.5 % (ref 0.0–3.0)
EOS ABS: 0.2 10*3/uL (ref 0.0–0.7)
Eosinophils Relative: 1.9 % (ref 0.0–5.0)
HEMATOCRIT: 48.1 % (ref 39.0–52.0)
HEMOGLOBIN: 15.9 g/dL (ref 13.0–17.0)
LYMPHS ABS: 2.3 10*3/uL (ref 0.7–4.0)
Lymphocytes Relative: 21.3 % (ref 12.0–46.0)
MCHC: 33 g/dL (ref 30.0–36.0)
MCV: 89.3 fl (ref 78.0–100.0)
MONO ABS: 1.1 10*3/uL — AB (ref 0.1–1.0)
Monocytes Relative: 10.5 % (ref 3.0–12.0)
NEUTROS ABS: 7.1 10*3/uL (ref 1.4–7.7)
Neutrophils Relative %: 65.8 % (ref 43.0–77.0)
Platelets: 205 10*3/uL (ref 150.0–400.0)
RBC: 5.39 Mil/uL (ref 4.22–5.81)
RDW: 14 % (ref 11.5–14.6)
WBC: 10.8 10*3/uL — ABNORMAL HIGH (ref 4.5–10.5)

## 2013-11-23 LAB — URINALYSIS
Hgb urine dipstick: NEGATIVE
Leukocytes, UA: NEGATIVE
Nitrite: NEGATIVE
PH: 6 (ref 5.0–8.0)
SPECIFIC GRAVITY, URINE: 1.025 (ref 1.000–1.030)
URINE GLUCOSE: NEGATIVE
Urobilinogen, UA: 0.2 (ref 0.0–1.0)

## 2013-11-23 NOTE — Progress Notes (Signed)
   Subjective:    Patient ID: Neil Carroll, male    DOB: 1951/06/24, 63 y.o.   MRN: [id number]  HPI  He is here for a physical;acute issues include LBP & frequency.  Review of Systems There is no definite trigger for low back pain. It typically can occur after he has been sitting or more inactive for a period time. Movement does actually improve it a does Tylenol. It is described as lumbosacral and bilateral & can be throbbing and last hours. There is no radiation into the lower extremities. There is no associated leg weakness, numbness, tingling he denies associated incontinence of urine or stool  He is seeing an Endocrinologist for his diabetes. His most recent A1c was 8.6% on 09/01/13. At that time his LDL was 42 and HDL 28.2.  The urinary frequency is not associated with dysuria, pyuria, or hematuria.No polyuria/dipsia/ or phagia. Nocturia every 2-3 hrs.     Objective:   Physical Exam  Gen.: well-nourished in appearance. Alert, appropriate and cooperative throughout exam.  Head: Normocephalic without obvious abnormalities;  pattern alopecia  Eyes: No corneal or conjunctival inflammation noted. Pupils equal round reactive to light and accommodation. Extraocular motion intact.  Ears: External  ear exam reveals no significant lesions or deformities. Canals clear .TMs normal. Hearing is grossly decreased on L Nose: External nasal exam reveals no deformity or inflammation. Nasal mucosa dry. No lesions or exudates noted. Septum to R  Mouth: Oral mucosa and oropharynx reveal no lesions or exudates. Teeth in good repair. Neck: No deformities, masses, or tenderness noted. Range of motion &. Thyroid normal. Lungs: Normal respiratory effort; chest expands symmetrically. Lungs reveal bibasilar rhonchi & rales w/o wheezes or increased work of breathing. Heart: Normal rate and rhythm. Normal S1 and S2. No gallop, click, or rub. S4 w/o murmur. Abdomen: Bowel sounds normal; abdomen soft and  nontender. No masses, or organomegaly . Ventral hernia noted. Genitalia: he wants to see Dr Jeffie Pollock                                 Musculoskeletal/extremities: No deformity or scoliosis noted of  the thoracic or lumbar spine.    Clubbing present.No cyanosis or significant extremity  deformity noted.Trace edema. Range of motion normal .Tone & strength normal. .  Chronic fingernail / toenail changes. Able to lie down & sit up w/o help. Negative SLR bilaterally Vascular: Carotid, radial artery, dorsalis pedis and  posterior tibial pulses are  equal. Decreased pedal pulses.No bruits present. Neurologic: Alert and oriented x3. Deep tendon reflexes symmetrical and normal.  Gait normal  .Skin: Intact without suspicious lesions or rashes. Lymph: No cervical, axillary lymphadenopathy present. Psych: Mood and affect are normal. Normally interactive                                                                                       Assessment & Plan:  #1 comprehensive physical exam; no acute findings #2 frequency , nocturia  Plan: see Orders  & Recommendations

## 2013-11-23 NOTE — Progress Notes (Signed)
Pre visit review using our clinic review tool, if applicable. No additional management support is needed unless otherwise documented below in the visit note. 

## 2013-11-23 NOTE — Patient Instructions (Signed)
Your next office appointment will be determined based upon review of your pending labs & x-rays. Those instructions will be transmitted to you  by mail. Followup as needed for your acute issue. Please report any significant change in your symptoms. Share results with Dr Jeffie Pollock. Please think about quitting smoking. Review the risks we discussed. Please call 1-800-QUIT-NOW [phone]) for free smoking cessation counseling.

## 2013-11-26 ENCOUNTER — Other Ambulatory Visit: Payer: Self-pay | Admitting: Internal Medicine

## 2013-11-26 DIAGNOSIS — J849 Interstitial pulmonary disease, unspecified: Secondary | ICD-10-CM | POA: Insufficient documentation

## 2013-11-28 ENCOUNTER — Encounter: Payer: Self-pay | Admitting: *Deleted

## 2013-12-05 ENCOUNTER — Other Ambulatory Visit: Payer: Federal, State, Local not specified - PPO

## 2013-12-05 ENCOUNTER — Other Ambulatory Visit (INDEPENDENT_AMBULATORY_CARE_PROVIDER_SITE_OTHER): Payer: Federal, State, Local not specified - PPO

## 2013-12-05 DIAGNOSIS — IMO0002 Reserved for concepts with insufficient information to code with codable children: Secondary | ICD-10-CM

## 2013-12-05 DIAGNOSIS — E1169 Type 2 diabetes mellitus with other specified complication: Principal | ICD-10-CM

## 2013-12-05 DIAGNOSIS — E1165 Type 2 diabetes mellitus with hyperglycemia: Secondary | ICD-10-CM

## 2013-12-05 LAB — BASIC METABOLIC PANEL
BUN: 12 mg/dL (ref 6–23)
CALCIUM: 9 mg/dL (ref 8.4–10.5)
CO2: 27 mEq/L (ref 19–32)
CREATININE: 1.1 mg/dL (ref 0.4–1.5)
Chloride: 102 mEq/L (ref 96–112)
GFR: 85.87 mL/min (ref >60.00)
GLUCOSE: 146 mg/dL — AB (ref 70–99)
Potassium: 4.1 mEq/L (ref 3.5–5.1)
Sodium: 137 mEq/L (ref 135–145)

## 2013-12-05 LAB — HEMOGLOBIN A1C: Hgb A1c MFr Bld: 7.8 % — ABNORMAL HIGH (ref 4.6–6.5)

## 2013-12-06 ENCOUNTER — Other Ambulatory Visit: Payer: Federal, State, Local not specified - PPO

## 2013-12-08 ENCOUNTER — Encounter: Payer: Self-pay | Admitting: Endocrinology

## 2013-12-08 ENCOUNTER — Ambulatory Visit (INDEPENDENT_AMBULATORY_CARE_PROVIDER_SITE_OTHER): Payer: Federal, State, Local not specified - PPO | Admitting: Endocrinology

## 2013-12-08 VITALS — BP 126/72 | HR 89 | Temp 98.1°F | Resp 14 | Ht 71.0 in | Wt 271.6 lb

## 2013-12-08 DIAGNOSIS — IMO0002 Reserved for concepts with insufficient information to code with codable children: Secondary | ICD-10-CM

## 2013-12-08 DIAGNOSIS — E785 Hyperlipidemia, unspecified: Secondary | ICD-10-CM

## 2013-12-08 DIAGNOSIS — E1165 Type 2 diabetes mellitus with hyperglycemia: Secondary | ICD-10-CM

## 2013-12-08 DIAGNOSIS — E1169 Type 2 diabetes mellitus with other specified complication: Principal | ICD-10-CM

## 2013-12-08 DIAGNOSIS — I10 Essential (primary) hypertension: Secondary | ICD-10-CM

## 2013-12-08 MED ORDER — LIRAGLUTIDE 18 MG/3ML ~~LOC~~ SOPN: 1.8000 mg | PEN_INJECTOR | Freq: Every day | SUBCUTANEOUS | Status: DC

## 2013-12-08 NOTE — Progress Notes (Signed)
Patient ID: Neil Carroll, male   DOB: 1951-01-27, 63 y.o.   MRN: [id number]   Reason for Appointment : Followup for Type 2 Diabetes  History of Present Illness          Diagnosis: Type 2 diabetes mellitus, date of diagnosis:  2008     Past history: He has been treated with metformin only for several years and had relatively good control until about a year ago. However had been taking only 1000 mg of metformin. Because of his progressively higher blood sugars he had been started on Levemir insulin since 6/14.  However with this his A1c was still 10.2 in 9/14 and he was referred here  Recent history:    He has had nutritional counseling about 2 months ago but still is not consistent with diet and has not lost any weight. He thinks he is not consistent with his diet and also may be getting some sweets periodically His A1c is slightly better but he is still not at target of 7% Despite reminders he is checking his blood sugars mostly in the morning and only sporadically later in the day He has about 3 readings nonfasting the last 2 months with only one high reading Fasting readings are fairly good with current regimen of Levemir Taking Victoza 1.2 mg consistently at night without any side effects   INSULIN regimen is described as:  Levemir 26 units in am  Oral hypoglycemic drugs the patient is taking are: Metformin Side effects from medications have been: None Compliance with the medical regimen: Fair   Glucose monitoring:  done one time a day         Glucometer: One Touch.      Blood Glucose readings from meter download: Fasting 87-154, median 123 P.c. lunch 191, at bedtime 87 Hypoglycemia frequency: Never.           Self-care:   Meals: 3 meals per day. He is eating out only occasionally, may go to fast food restaurants, may be getting some sweets snacks  Physical activity: exercise: Trying to walk about 5 minutes daily, limited by leg pain         Dietician visit: Most recent:  09/2013                Retinal exam: Most recent: 2 years ago.   Wt Readings from Last 3 Encounters:  12/08/13 271 lb 9.6 oz (123.197 kg)  11/23/13 267 lb 1.3 oz (121.147 kg)  10/04/13 269 lb 4.8 oz (122.154 kg)       Lab Results  Component Value Date   HGBA1C 7.8* 12/05/2013   HGBA1C 8.6* 09/01/2013   HGBA1C 10.2* 05/25/2013   Lab Results  Component Value Date   MICROALBUR 3.0* 09/01/2013   LDLCALC 42 09/01/2013   CREATININE 1.1 12/05/2013       Medication List       This list is accurate as of: 12/08/13 10:14 AM.  Always use your most recent med list.               ADVAIR DISKUS 100-50 MCG/DOSE Aepb  Generic drug:  Fluticasone-Salmeterol  inhale 1 dose by mouth twice a day     ammonium lactate 12 % cream  Commonly known as:  AMLACTIN  Apply topically as needed.     aspirin 325 MG tablet  Take 325 mg by mouth daily.     cilostazol 100 MG tablet  Commonly known as:  PLETAL  take 1 tablet by  mouth twice a day     CRESTOR 20 MG tablet  Generic drug:  rosuvastatin  take 1 tablet by mouth once daily     Fish Oil 1000 MG Caps  Take 2 capsules by mouth daily.     Flax Seed Oil 1000 MG Caps  Take 1 capsule by mouth 2 (two) times daily.     glucose blood test strip  Commonly known as:  ONE TOUCH ULTRA TEST  Blood Sugar tested daily.  Dx:250.02     halobetasol 0.05 % cream  Commonly known as:  ULTRAVATE  Apply topically 2 (two) times daily.     hydrochlorothiazide 25 MG tablet  Commonly known as:  HYDRODIURIL  Take 1 tablet (25 mg total) by mouth daily.     Insulin Pen Needle 32G X 6 MM Misc  Commonly known as:  NOVOFINE  Use 2 needles daily with Levimir pen and Victoza pen  DX: 250.02     ketoconazole 2 % cream  Commonly known as:  NIZORAL  Apply topically 2 (two) times daily.     LEVEMIR FLEXTOUCH 100 UNIT/ML Pen  Generic drug:  Insulin Detemir  INJECT 12 UNITS INTO THE SKIN EVERY DAY     Liraglutide 18 MG/3ML Sopn  Inject 1.2 mg into the skin  daily.     losartan 100 MG tablet  Commonly known as:  COZAAR  take 1 tablet by mouth once daily     metformin 1000 MG (OSM) 24 hr tablet  Commonly known as:  FORTAMET  take 1 tablet by mouth 2 times a day     metoprolol 100 MG tablet  Commonly known as:  LOPRESSOR  take 1 tablet by mouth twice a day     mupirocin ointment 2 %  Commonly known as:  BACTROBAN  Apply 1 application topically 2 (two) times daily.     ONE TOUCH LANCETS Misc  OneTouch Delica 62I Sterile Lancets     tamsulosin 0.4 MG Caps capsule  Commonly known as:  FLOMAX  Take 0.4 mg by mouth.     triamcinolone cream 0.1 %  Commonly known as:  KENALOG  Apply topically 2 (two) times daily.     VECTICAL 3 MCG/GM cream  Generic drug:  Calcitriol  Apply topically at bedtime.        Allergies:  Allergies  Allergen Reactions  . Niacin Other (See Comments)    REACTION: upset stomach    Past Medical History  Diagnosis Date  . Other peripheral vascular disease(443.89)     bilateral lower extremity  . Hyperlipidemia   . Hypertension   . Transient global amnesia   . Contact dermatitis and other eczema due to plants (except food)   . Tobacco use disorder     dependent  . Atrial flutter     onset  2011. s/p EPS/RFA 12/2011  . Type II diabetes mellitus   . Chronic bronchitis   . GERD (gastroesophageal reflux disease)   . LV dysfunction     EF 45-50% 12/2011    Past Surgical History  Procedure Laterality Date  . Partial hip arthroplasty  01/2000    "Right; replaced ball & stem"  . Partial hip arthroplasty  01/2000    Right  . Cystoscopy  11/2007    Dr Jeffie Pollock  . Joint replacement    . Cardiac electrophysiology mapping and ablation  03/2010  . Tee without cardioversion  12/28/2011    Procedure: TRANSESOPHAGEAL ECHOCARDIOGRAM (TEE);  Surgeon: Elby Showers  Aundra Dubin, MD;  Location: [facility] ENDOSCOPY;  Service: Cardiovascular;  Laterality: N/A;  . Colonoscopy with polypectomy  2006 & 2011    Dr [name]    Family  History  Problem Relation Age of Onset  . Stroke Mother 53  . Hypertension Mother   . Diabetes Mother   . Lung cancer Father     smoker  . Diabetes Paternal Grandmother   . Diabetes Brother     MI @ 47  . Diabetes Brother     Hepatitis C  . Throat cancer Paternal Uncle     1/2 uncle  . Heart attack Brother 67  . Colon cancer Neg Hx   . Crohn's disease Son     Social History:  reports that he has been smoking Cigarettes.  He has a 44 pack-year smoking history. He has never used smokeless tobacco. He reports that he drinks alcohol. He reports that he does not use illicit drugs.    Review of Systems       Lipids: He has persistently low HDL, LDL well controlled, taking Crestor  Lab Results  Component Value Date   CHOL 95 09/01/2013   HDL 28.20* 09/01/2013   LDLCALC 42 09/01/2013   TRIG 124.0 09/01/2013   CHOLHDL 3 09/01/2013              Has occasional  history of Numbness, tingling  in the right first or second toes, no  burning in feet     LABS:  Appointment on 12/05/2013  Component Date Value Ref Range Status  . Hemoglobin A1C 12/05/2013 7.8* 4.6 - 6.5 % Final   Glycemic Control Guidelines for People with Diabetes:Non Diabetic:  <6%Goal of Therapy: <7%Additional Action Suggested:  >8%   . Sodium 12/05/2013 137  135 - 145 mEq/L Final  . Potassium 12/05/2013 4.1  3.5 - 5.1 mEq/L Final  . Chloride 12/05/2013 102  96 - 112 mEq/L Final  . CO2 12/05/2013 27  19 - 32 mEq/L Final  . Glucose, Bld 12/05/2013 146* 70 - 99 mg/dL Final  . BUN 12/05/2013 12  6 - 23 mg/dL Final  . Creatinine, Ser 12/05/2013 1.1  0.4 - 1.5 mg/dL Final  . Calcium 12/05/2013 9.0  8.4 - 10.5 mg/dL Final  . GFR 12/05/2013 85.87  >60.00 mL/min Final     Physical Examination:  BP 126/72  Pulse 89  Temp(Src) 98.1 F (36.7 C)  Resp 14  Ht 5\' 11"  (1.803 m)  Wt 271 lb 9.6 oz (123.197 kg)  BMI 37.90 kg/m2  SpO2 95%     ASSESSMENT:  Diabetes type 2, uncontrolled - 250.02    He has had  improved blood sugar control since with Victoza his metformin and insulin; A1c is gradually improving. However his A1c has still not come down below 7 % and he may be having high postprandial readings. Despite repeated discussion he is not doing his blood sugars after meals as he forgets Also he has a tendency to having sweet snacks which may be raising his blood sugar Weight loss has also been difficult despite consultation with dietitian  However he may be having postprandial hyperglycemia since his A1c is significantly high despite having fairly good morning readings  PLAN:   Will need to get a better idea of his postprandial readings to confirm that he has higher readings in proportion to his A1c  Followup with dietitian as scheduled Increase the Victoza to 1.8 mg to provide better overall control especially postprandial Increase walking  as tolerated   Tomekia Helton 12/08/2013, 10:14 AM

## 2013-12-08 NOTE — Patient Instructions (Signed)
Please check blood sugars at least half the time about 2 hours after any meal and as directed on waking up. Please bring blood sugar monitor to each visit  Walk 2x daily  Less carbs, fats and sweets  Victoza 1.8mg  daily

## 2013-12-20 ENCOUNTER — Other Ambulatory Visit: Payer: Self-pay | Admitting: Internal Medicine

## 2014-01-22 ENCOUNTER — Other Ambulatory Visit: Payer: Self-pay | Admitting: *Deleted

## 2014-01-22 MED ORDER — METFORMIN HCL ER (OSM) 1000 MG PO TB24: ORAL_TABLET | ORAL | Status: DC

## 2014-02-19 ENCOUNTER — Other Ambulatory Visit: Payer: Self-pay | Admitting: Cardiovascular Disease

## 2014-03-05 ENCOUNTER — Other Ambulatory Visit (INDEPENDENT_AMBULATORY_CARE_PROVIDER_SITE_OTHER): Payer: Federal, State, Local not specified - PPO

## 2014-03-05 DIAGNOSIS — IMO0002 Reserved for concepts with insufficient information to code with codable children: Secondary | ICD-10-CM

## 2014-03-05 DIAGNOSIS — E1169 Type 2 diabetes mellitus with other specified complication: Principal | ICD-10-CM

## 2014-03-05 DIAGNOSIS — E1165 Type 2 diabetes mellitus with hyperglycemia: Secondary | ICD-10-CM

## 2014-03-05 LAB — COMPREHENSIVE METABOLIC PANEL
ALK PHOS: 43 U/L (ref 39–117)
ALT: 16 U/L (ref 0–53)
AST: 19 U/L (ref 0–37)
Albumin: 3.9 g/dL (ref 3.5–5.2)
BUN: 10 mg/dL (ref 6–23)
CO2: 25 mEq/L (ref 19–32)
Calcium: 8.8 mg/dL (ref 8.4–10.5)
Chloride: 106 mEq/L (ref 96–112)
Creatinine, Ser: 1.1 mg/dL (ref 0.4–1.5)
GFR: 83.2 mL/min (ref >60.00)
Glucose, Bld: 118 mg/dL — ABNORMAL HIGH (ref 70–99)
POTASSIUM: 3.9 meq/L (ref 3.5–5.1)
SODIUM: 139 meq/L (ref 135–145)
TOTAL PROTEIN: 7.3 g/dL (ref 6.0–8.3)
Total Bilirubin: 0.3 mg/dL (ref 0.2–1.2)

## 2014-03-05 LAB — LIPID PANEL
CHOL/HDL RATIO: 3
Cholesterol: 88 mg/dL (ref 0–200)
HDL: 30.5 mg/dL — ABNORMAL LOW (ref >39.00)
LDL CALC: 40 mg/dL (ref 0–99)
NonHDL: 57.5
TRIGLYCERIDES: 90 mg/dL (ref 0.0–149.0)
VLDL: 18 mg/dL (ref 0.0–40.0)

## 2014-03-05 LAB — HEMOGLOBIN A1C: Hgb A1c MFr Bld: 7 % — ABNORMAL HIGH (ref 4.6–6.5)

## 2014-03-05 LAB — MICROALBUMIN / CREATININE URINE RATIO
Creatinine,U: 242.3 mg/dL
Microalb Creat Ratio: 0.6 mg/g (ref 0.0–30.0)
Microalb, Ur: 1.4 mg/dL (ref 0.0–1.9)

## 2014-03-09 ENCOUNTER — Ambulatory Visit (INDEPENDENT_AMBULATORY_CARE_PROVIDER_SITE_OTHER): Payer: Federal, State, Local not specified - PPO | Admitting: Endocrinology

## 2014-03-09 ENCOUNTER — Encounter: Payer: Self-pay | Admitting: Endocrinology

## 2014-03-09 VITALS — BP 124/66 | HR 90 | Temp 98.2°F | Resp 16 | Ht 71.0 in | Wt 265.0 lb

## 2014-03-09 DIAGNOSIS — E785 Hyperlipidemia, unspecified: Secondary | ICD-10-CM

## 2014-03-09 DIAGNOSIS — E1165 Type 2 diabetes mellitus with hyperglycemia: Secondary | ICD-10-CM

## 2014-03-09 DIAGNOSIS — IMO0002 Reserved for concepts with insufficient information to code with codable children: Secondary | ICD-10-CM

## 2014-03-09 DIAGNOSIS — I10 Essential (primary) hypertension: Secondary | ICD-10-CM

## 2014-03-09 DIAGNOSIS — E1169 Type 2 diabetes mellitus with other specified complication: Principal | ICD-10-CM

## 2014-03-09 NOTE — Progress Notes (Signed)
Patient ID: Neil Carroll, male   DOB: 06-Jul-1951, 63 y.o.   MRN: [id number]   Reason for Appointment : Followup for Type 2 Diabetes  History of Present Illness          Diagnosis: Type 2 diabetes mellitus, date of diagnosis:  2008     Past history: He has been treated with metformin only for several years and had relatively good control until about a year ago. However had been taking only 1000 mg of metformin. Because of his progressively higher blood sugars he had been started on Levemir insulin since 6/14.  However with this his A1c was still 10.2 in 9/14 and he was referred here  Recent history:   With increasing Victoza to 1.8 mg in 3/15 his weight has started to come down and his blood sugars are overall better especially in the morning  He has had nutritional counseling in 1/15  And he thinks he is doing better especially with portion control He thinks he is not consistent with his diet and also may be getting some sweets periodically His A1c is slightly better and he is now at target of 7% Despite reminders he is checking his blood sugars mostly in the morning and only sporadically after dinner Blood sugars are averaging about 150 after dinner Fasting readings are fairly good with current regimen of Levemir However he says that even when he ran out of his Levemir for 4 days last month his blood sugars are not much higher in the morning   INSULIN regimen is described as:  Levemir 26 units in am   Oral hypoglycemic drugs the patient is taking are: Metformin Side effects from medications have been: None Compliance with the medical regimen: Fair   Glucose monitoring:  done one time a day         Glucometer: One Touch.      Blood Glucose readings from meter download:   PREMEAL Breakfast Lunch Dinner Bedtime Overall  Glucose range:    112-178   Mean/median:     117   Hypoglycemia frequency: Never.           Self-care:   Meals: 3 meals per day. He is eating out only  occasionally, may go to fast food restaurants Physical activity: exercise: Trying to walk about 5 minutes daily, limited by leg pain         Dietician visit: Most recent: 09/2013                Retinal exam: Most recent: 2 years ago.   Wt Readings from Last 3 Encounters:  03/09/14 265 lb (120.203 kg)  12/08/13 271 lb 9.6 oz (123.197 kg)  11/23/13 267 lb 1.3 oz (121.147 kg)       Lab Results  Component Value Date   HGBA1C 7.0* 03/05/2014   HGBA1C 7.8* 12/05/2013   HGBA1C 8.6* 09/01/2013   Lab Results  Component Value Date   MICROALBUR 1.4 03/05/2014   LDLCALC 40 03/05/2014   CREATININE 1.1 03/05/2014       Medication List       This list is accurate as of: 03/09/14 10:40 AM.  Always use your most recent med list.               ADVAIR DISKUS 100-50 MCG/DOSE Aepb  Generic drug:  Fluticasone-Salmeterol  inhale 1 dose by mouth twice a day     ammonium lactate 12 % cream  Commonly known as:  AMLACTIN  Apply topically  as needed.     aspirin 325 MG tablet  Take 325 mg by mouth daily.     cilostazol 100 MG tablet  Commonly known as:  PLETAL  take 1 tablet by mouth twice a day     CRESTOR 20 MG tablet  Generic drug:  rosuvastatin  take 1 tablet by mouth once daily     Fish Oil 1000 MG Caps  Take 2 capsules by mouth daily.     Flax Seed Oil 1000 MG Caps  Take 1 capsule by mouth 2 (two) times daily.     glucose blood test strip  Commonly known as:  ONE TOUCH ULTRA TEST  Blood Sugar tested daily.  Dx:250.02     halobetasol 0.05 % cream  Commonly known as:  ULTRAVATE  Apply topically 2 (two) times daily.     hydrochlorothiazide 25 MG tablet  Commonly known as:  HYDRODIURIL  Take 1 tablet (25 mg total) by mouth daily.     Insulin Pen Needle 32G X 6 MM Misc  Commonly known as:  NOVOFINE  Use 2 needles daily with Levimir pen and Victoza pen  DX: 250.02     ketoconazole 2 % cream  Commonly known as:  NIZORAL  Apply topically 2 (two) times daily.     LEVEMIR  FLEXTOUCH 100 UNIT/ML Pen  Generic drug:  Insulin Detemir  INJECT 26 UNITS INTO THE SKIN EVERY DAY     Liraglutide 18 MG/3ML Sopn  Inject 1.8 mg into the skin daily.     losartan 100 MG tablet  Commonly known as:  COZAAR  take 1 tablet by mouth once daily     metformin 1000 MG (OSM) 24 hr tablet  Commonly known as:  FORTAMET  take 1 tablet by mouth 2 times a day     metoprolol 100 MG tablet  Commonly known as:  LOPRESSOR  take 1 tablet by mouth twice a day     mupirocin ointment 2 %  Commonly known as:  BACTROBAN  Apply 1 application topically 2 (two) times daily.     ONE TOUCH LANCETS Misc  OneTouch Delica 40C Sterile Lancets     tamsulosin 0.4 MG Caps capsule  Commonly known as:  FLOMAX  Take 0.4 mg by mouth.     triamcinolone cream 0.1 %  Commonly known as:  KENALOG  Apply topically 2 (two) times daily.     VECTICAL 3 MCG/GM cream  Generic drug:  Calcitriol  Apply topically at bedtime.        Allergies:  Allergies  Allergen Reactions  . Niacin Other (See Comments)    REACTION: upset stomach    Past Medical History  Diagnosis Date  . Other peripheral vascular disease(443.89)     bilateral lower extremity  . Hyperlipidemia   . Hypertension   . Transient global amnesia   . Contact dermatitis and other eczema due to plants (except food)   . Tobacco use disorder     dependent  . Atrial flutter     onset  2011. s/p EPS/RFA 12/2011  . Type II diabetes mellitus   . Chronic bronchitis   . GERD (gastroesophageal reflux disease)   . LV dysfunction     EF 45-50% 12/2011    Past Surgical History  Procedure Laterality Date  . Partial hip arthroplasty  01/2000    "Right; replaced ball & stem"  . Partial hip arthroplasty  01/2000    Right  . Cystoscopy  11/2007  Dr Jeffie Pollock  . Joint replacement    . Cardiac electrophysiology mapping and ablation  03/2010  . Tee without cardioversion  12/28/2011    Procedure: TRANSESOPHAGEAL ECHOCARDIOGRAM (TEE);  Surgeon:  Larey Dresser, MD;  Location: [place];  Service: Cardiovascular;  Laterality: N/A;  . Colonoscopy with polypectomy  2006 & 2011    Dr [name]    Family History  Problem Relation Age of Onset  . Stroke Mother 77  . Hypertension Mother   . Diabetes Mother   . Lung cancer Father     smoker  . Diabetes Paternal Grandmother   . Diabetes Brother     MI @ 54  . Diabetes Brother     Hepatitis C  . Throat cancer Paternal Uncle     1/2 uncle  . Heart attack Brother 47  . Colon cancer Neg Hx   . Crohn's disease Son     Social History:  reports that he has been smoking Cigarettes.  He has a 44 pack-year smoking history. He has never used smokeless tobacco. He reports that he drinks alcohol. He reports that he does not use illicit drugs.    Review of Systems       Lipids: He has persistently low HDL, LDL well controlled, taking Crestor  Lab Results  Component Value Date   CHOL 88 03/05/2014   HDL 30.50* 03/05/2014   LDLCALC 40 03/05/2014   TRIG 90.0 03/05/2014   CHOLHDL 3 03/05/2014              Has occasional  history of Numbness, tingling  in the right first or second toes, no  burning in feet    He continues to have significant problem with his psoriasis, asking about his liver functions   LABS:  Appointment on 03/05/2014  Component Date Value Ref Range Status  . Hemoglobin A1C 03/05/2014 7.0* 4.6 - 6.5 % Final   Glycemic Control Guidelines for People with Diabetes:Non Diabetic:  <6%Goal of Therapy: <7%Additional Action Suggested:  >8%   . Sodium 03/05/2014 139  135 - 145 mEq/L Final  . Potassium 03/05/2014 3.9  3.5 - 5.1 mEq/L Final  . Chloride 03/05/2014 106  96 - 112 mEq/L Final  . CO2 03/05/2014 25  19 - 32 mEq/L Final  . Glucose, Bld 03/05/2014 118* 70 - 99 mg/dL Final  . BUN 03/05/2014 10  6 - 23 mg/dL Final  . Creatinine, Ser 03/05/2014 1.1  0.4 - 1.5 mg/dL Final  . Total Bilirubin 03/05/2014 0.3  0.2 - 1.2 mg/dL Final  . Alkaline Phosphatase 03/05/2014 43  39  - 117 U/L Final  . AST 03/05/2014 19  0 - 37 U/L Final  . ALT 03/05/2014 16  0 - 53 U/L Final  . Total Protein 03/05/2014 7.3  6.0 - 8.3 g/dL Final  . Albumin 03/05/2014 3.9  3.5 - 5.2 g/dL Final  . Calcium 03/05/2014 8.8  8.4 - 10.5 mg/dL Final  . GFR 03/05/2014 83.20  >60.00 mL/min Final  . Cholesterol 03/05/2014 88  0 - 200 mg/dL Final   ATP III Classification       Desirable:  < 200 mg/dL               Borderline High:  200 - 239 mg/dL          High:  > = 240 mg/dL  . Triglycerides 03/05/2014 90.0  0.0 - 149.0 mg/dL Final   Normal:  <150 mg/dLBorderline High:  150 -  199 mg/dL  . HDL 03/05/2014 30.50* >39.00 mg/dL Final  . VLDL 03/05/2014 18.0  0.0 - 40.0 mg/dL Final  . LDL Cholesterol 03/05/2014 40  0 - 99 mg/dL Final  . Total CHOL/HDL Ratio 03/05/2014 3   Final                  Men          Women1/2 Average Risk     3.4          3.3Average Risk          5.0          4.42X Average Risk          9.6          7.13X Average Risk          15.0          11.0                      . NonHDL 03/05/2014 57.50   Final  . Microalb, Ur 03/05/2014 1.4  0.0 - 1.9 mg/dL Final  . Creatinine,U 03/05/2014 242.3   Final  . Microalb Creat Ratio 03/05/2014 0.6  0.0 - 30.0 mg/g Final     Physical Examination:  BP 124/66  Pulse 90  Temp(Src) 98.2 F (36.8 C)  Resp 16  Ht 5\' 11"  (1.803 m)  Wt 265 lb (120.203 kg)  BMI 36.98 kg/m2  SpO2 90%    No pedal edema present  ASSESSMENT:  Diabetes type 2, uncontrolled     He has had improved blood sugar control since his Victoza was increased to 1.8 mg along with continuing his metformin and basal insulin; A1c is down to 7% and now He has lost a little weight but not sufficiently, limited by his inability to exercise much Again discussed checking blood sugars after meals more often although he has started doing better Discussed blood sugar targets both before and after meals Apparently his blood sugars are only about 140+ when he was out of his Levemir  insulin and may be fairly good control even with lower doses which may allow him to lose weight better He has already received dietary counseling and is doing better with portions with the help of Victoza Discussed his A1c results and made him understand that he postprandial component of this  HYPERCHOLESTEROLEMIA: LDL is very well controlled, on high-dose statin; no history of CAD  Complications: Mild neuropathic symptoms, no other evident complications  PLAN:   Continue 1.8 mg Victoza May reduce Levemir at 2-4 minutes as long as fasting readings are consistent More readings after breakfast and lunch and some after dinner also Increase walking as tolerated Followup eye exam  Counseling time over 50% of today's 25 minute visit   Arika Mainer 03/09/2014, 10:40 AM

## 2014-03-09 NOTE — Patient Instructions (Addendum)
Levemir 24 or 22 as long as sugar stays about 120 or less in am  Please check blood sugars at least half the time about 2 hours after any meal and 4-5 times per week on waking up. Please bring blood sugar monitor to each visit  Lab Results  Component Value Date   ALT 16 03/05/2014   AST 19 03/05/2014   ALKPHOS 43 03/05/2014   BILITOT 0.3 03/05/2014

## 2014-03-20 ENCOUNTER — Ambulatory Visit: Payer: Federal, State, Local not specified - PPO | Admitting: *Deleted

## 2014-03-21 ENCOUNTER — Other Ambulatory Visit: Payer: Self-pay | Admitting: Internal Medicine

## 2014-03-26 ENCOUNTER — Telehealth: Payer: Self-pay | Admitting: *Deleted

## 2014-03-26 ENCOUNTER — Telehealth: Payer: Self-pay | Admitting: Internal Medicine

## 2014-03-26 ENCOUNTER — Telehealth: Payer: Self-pay | Admitting: Endocrinology

## 2014-03-26 ENCOUNTER — Other Ambulatory Visit: Payer: Self-pay | Admitting: *Deleted

## 2014-03-26 MED ORDER — ROSUVASTATIN CALCIUM 20 MG PO TABS: ORAL_TABLET | ORAL | Status: DC

## 2014-03-26 MED ORDER — INSULIN DETEMIR 100 UNIT/ML FLEXPEN: PEN_INJECTOR | SUBCUTANEOUS | Status: DC

## 2014-03-26 NOTE — Telephone Encounter (Signed)
error 

## 2014-03-26 NOTE — Telephone Encounter (Signed)
rx sent

## 2014-03-26 NOTE — Telephone Encounter (Signed)
Patient would like his rx called in to [facility]  Levemir Flex Pen   Fax# [phone]  Thank You

## 2014-03-26 NOTE — Telephone Encounter (Signed)
Left smg on triage needing his crestor sent to [facility]. Inform pt we will send to mail service...Neil Carroll

## 2014-04-06 ENCOUNTER — Ambulatory Visit
Admission: RE | Admit: 2014-04-06 | Discharge: 2014-04-06 | Disposition: A | Discharge status: Home / Self Care | Payer: No Typology Code available for payment source | Source: Ambulatory Visit | Attending: Infectious Disease | Admitting: Infectious Disease

## 2014-04-06 ENCOUNTER — Other Ambulatory Visit: Payer: Self-pay | Admitting: Infectious Disease

## 2014-04-06 DIAGNOSIS — R7611 Nonspecific reaction to tuberculin skin test without active tuberculosis: Secondary | ICD-10-CM

## 2014-04-17 ENCOUNTER — Other Ambulatory Visit: Payer: Self-pay | Admitting: *Deleted

## 2014-04-17 MED ORDER — METFORMIN HCL ER (OSM) 1000 MG PO TB24: ORAL_TABLET | ORAL | Status: DC

## 2014-04-17 MED ORDER — LIRAGLUTIDE 18 MG/3ML ~~LOC~~ SOPN: 1.8000 mg | PEN_INJECTOR | Freq: Every day | SUBCUTANEOUS | Status: DC

## 2014-04-21 ENCOUNTER — Other Ambulatory Visit: Payer: Self-pay | Admitting: Internal Medicine

## 2014-04-23 ENCOUNTER — Other Ambulatory Visit: Payer: Self-pay

## 2014-04-23 MED ORDER — FLUTICASONE-SALMETEROL 100-50 MCG/DOSE IN AEPB: INHALATION_SPRAY | RESPIRATORY_TRACT | Status: DC

## 2014-05-09 ENCOUNTER — Telehealth: Payer: Self-pay | Admitting: *Deleted

## 2014-05-09 NOTE — Telephone Encounter (Signed)
Records needed for EMR

## 2014-05-09 NOTE — Telephone Encounter (Signed)
Left msg on triage stating wanting to inform md they received a referral from Dr. [name] pt tested positive for both blood & skin test for Tuberculosis. Pt has been seeing [name] he has had a cxr & spectrum test results came back negative for TB. He will start getting monthly labs for the next 8 months...Neil Carroll

## 2014-05-09 NOTE — Telephone Encounter (Signed)
Notified carolyn with md response. Will fax over records to [phone]...Neil Carroll

## 2014-05-16 ENCOUNTER — Other Ambulatory Visit: Payer: Self-pay | Admitting: Cardiovascular Disease

## 2014-05-21 ENCOUNTER — Other Ambulatory Visit: Payer: Self-pay | Admitting: Internal Medicine

## 2014-05-29 ENCOUNTER — Encounter: Payer: Federal, State, Local not specified - PPO | Admitting: Cardiovascular Disease

## 2014-06-05 NOTE — Progress Notes (Signed)
This encounter was created in error - please disregard.

## 2014-06-18 ENCOUNTER — Encounter: Payer: Self-pay | Admitting: Cardiovascular Disease

## 2014-06-18 ENCOUNTER — Ambulatory Visit (INDEPENDENT_AMBULATORY_CARE_PROVIDER_SITE_OTHER): Payer: Federal, State, Local not specified - PPO | Admitting: Cardiovascular Disease

## 2014-06-18 VITALS — BP 102/58 | HR 82 | Ht 71.0 in | Wt 260.0 lb

## 2014-06-18 DIAGNOSIS — I4892 Unspecified atrial flutter: Secondary | ICD-10-CM

## 2014-06-18 NOTE — Patient Instructions (Signed)
Your physician recommends that you continue on your current medications as directed. Please refer to the Current Medication list given to you today.  Your physician wants you to follow-up in: 1 year with Dr. Cooper.  You will receive a reminder letter in the mail two months in advance. If you don't receive a letter, please call our office to schedule the follow-up appointment.   

## 2014-06-18 NOTE — Progress Notes (Signed)
HPI:   63 year old gentleman presenting for followup evaluation. The patient has a history of atrial flutter status post redo frequency ablation, peripheral arterial disease, presumed CAD based on an abnormal stress Myoview,and ongoing tobacco.a nuclear scan in 2013 was abnormal but low risk and medical therapy was recommended. Acarotid duplex from January 2014 showed less than 50% carotid stenosis bilaterally. Lower extremity Doppler studies showed bilateral SFA occlusive disease with ABIs in the moderate range bilaterally. Last lipids from June 2015: Lipid Panel     Component Value Date/Time   CHOL 88 03/05/2014 0729   TRIG 90.0 03/05/2014 0729   HDL 30.50* 03/05/2014 0729   CHOLHDL 3 03/05/2014 0729   VLDL 18.0 03/05/2014 0729   LDLCALC 40 03/05/2014 0729   The patient reports no significant change in his symptoms. He is not limited by leg pain. He does experience bilateral calf pain when he walks a significant distance. He's been able to do some yard work without exertional symptoms of chest pain or pressure. He has chronic exertional dyspnea which is unchanged. He continues to smoke cigarettes. He denies orthopnea or PND. Other than some changes in his oral hypoglycemic medications, he reports no other medicine changes.   Outpatient Encounter Prescriptions as of 06/18/2014  Medication Sig  . ammonium lactate (AMLACTIN) 12 % cream Apply topically as needed.  [name] aspirin 325 MG tablet Take 325 mg by mouth daily.  . Calcitriol (VECTICAL) 3 MCG/GM cream Apply topically at bedtime.  . cilostazol (PLETAL) 100 MG tablet take 1 tablet by mouth twice a day  . Flaxseed, Linseed, (FLAX SEED OIL) 1000 MG CAPS Take 1 capsule by mouth 2 (two) times daily.   . Fluticasone-Salmeterol (ADVAIR DISKUS) 100-50 MCG/DOSE AEPB inhale 1 dose by mouth twice a day  . glucose blood (ONE TOUCH ULTRA TEST) test strip Blood Sugar tested daily.  Dx:250.02  . halobetasol (ULTRAVATE) 0.05 % cream Apply topically 2 (two)  times daily.  . hydrochlorothiazide (HYDRODIURIL) 25 MG tablet take 1 tablet by mouth once daily  . Insulin Detemir (LEVEMIR FLEXTOUCH) 100 UNIT/ML Pen INJECT 26 UNITS INTO THE SKIN EVERY DAY  . Insulin Pen Needle (NOVOFINE) 32G X 6 MM MISC Use 2 needles daily with Levimir pen and Victoza pen  DX: 250.02  . ketoconazole (NIZORAL) 2 % cream Apply topically 2 (two) times daily.    . Liraglutide 18 MG/3ML SOPN Inject 1.8 mg into the skin daily.  [name] losartan (COZAAR) 100 MG tablet take 1 tablet by mouth once daily  . metformin (FORTAMET) 1000 MG (OSM) 24 hr tablet take 1 tablet by mouth 2 times a day  . metoprolol (LOPRESSOR) 100 MG tablet take 1 tablet by mouth twice a day  . mupirocin ointment (BACTROBAN) 2 % Apply 1 application topically 2 (two) times daily.  . Omega-3 Fatty Acids (FISH OIL) 1000 MG CAPS Take 2 capsules by mouth daily.    . ONE TOUCH LANCETS MISC OneTouch Delica 76H Sterile Lancets  . Pyridoxine HCl (VITAMIN B6 PO) Take 25 mg by mouth daily.  . rosuvastatin (CRESTOR) 20 MG tablet take 1 tablet by mouth once daily  . tamsulosin (FLOMAX) 0.4 MG CAPS capsule Take 0.4 mg by mouth.  . triamcinolone cream (KENALOG) 0.1 % Apply topically 2 (two) times daily.    Allergies  Allergen Reactions  . Niacin Other (See Comments)    REACTION: upset stomach    Past Medical History  Diagnosis Date  . Other peripheral vascular disease(443.89)  bilateral lower extremity  . Hyperlipidemia   . Hypertension   . Transient global amnesia   . Contact dermatitis and other eczema due to plants (except food)   . Tobacco use disorder     dependent  . Atrial flutter     onset  2011. s/p EPS/RFA 12/2011  . Type II diabetes mellitus   . Chronic bronchitis   . GERD (gastroesophageal reflux disease)   . LV dysfunction     EF 45-50% 12/2011    ROS: Negative except as per HPI  BP 102/58  Pulse 82  Ht 5\' 11"  (1.803 m)  Wt 260 lb (117.935 kg)  BMI 36.28 kg/m2  PHYSICAL EXAM: Pt is alert  and oriented, pleasant obese male in NAD HEENT: normal Neck: JVP - normal, carotids 2+= without bruits Lungs: Diffuse expiratory rhonchi bilaterally CV: RRR without murmur or gallop Abd: soft, NT, Positive BS, no hepatomegaly Ext: Trace pretibial edema bilaterally  EKG:  Normal sinus rhythm 82 beats per minute, occasional PAC, nonspecific T wave abnormality.  ASSESSMENT AND PLAN: 1. Coronary artery disease, native vessel. The patient appears stable without symptoms of angina. He has had a low risk Myoview scan. He will on his current medical program which was carefully reviewed today.  2. Peripheral arterial disease with intermittent claudication. Stable, non-lifestyle limiting calf claudication. Resting ischemic symptoms. He continues on cilostazol.  3. Hypertension. Blood pressure is well controlled on a combination of hydrochlorothiazide, losartan, and metoprolol.  4. Tobacco abuse. Cessation counseling done. He is well educated and understands the ramifications of continued tobacco. He will do his best to quit or cut back.  5. Hyperlipidemia. Lipids are at goal. These were reviewed today. He remains on Crestor 20 mg daily.  6. Type 2 diabetes. Managed by Dr. Dwyane Dee. Reports improvement in glycemic control.  7. Atrial flutter. Maintaining sinus rhythm after RF ablation.  Sherren Mocha 06/18/2014 4:02 PM

## 2014-06-19 ENCOUNTER — Encounter: Payer: Self-pay | Admitting: Cardiovascular Disease

## 2014-06-21 ENCOUNTER — Other Ambulatory Visit: Payer: Self-pay | Admitting: Cardiovascular Disease

## 2014-06-25 ENCOUNTER — Ambulatory Visit (INDEPENDENT_AMBULATORY_CARE_PROVIDER_SITE_OTHER): Payer: Federal, State, Local not specified - PPO | Admitting: Internal Medicine

## 2014-06-25 ENCOUNTER — Other Ambulatory Visit (INDEPENDENT_AMBULATORY_CARE_PROVIDER_SITE_OTHER): Payer: Federal, State, Local not specified - PPO

## 2014-06-25 ENCOUNTER — Other Ambulatory Visit: Payer: Self-pay | Admitting: *Deleted

## 2014-06-25 ENCOUNTER — Encounter: Payer: Self-pay | Admitting: Internal Medicine

## 2014-06-25 VITALS — BP 130/80 | HR 76 | Temp 98.4°F | Resp 12 | Wt 262.2 lb

## 2014-06-25 DIAGNOSIS — M940 Chondrocostal junction syndrome [Tietze]: Secondary | ICD-10-CM

## 2014-06-25 DIAGNOSIS — E1165 Type 2 diabetes mellitus with hyperglycemia: Secondary | ICD-10-CM

## 2014-06-25 DIAGNOSIS — IMO0002 Reserved for concepts with insufficient information to code with codable children: Secondary | ICD-10-CM

## 2014-06-25 DIAGNOSIS — E785 Hyperlipidemia, unspecified: Secondary | ICD-10-CM

## 2014-06-25 DIAGNOSIS — E118 Type 2 diabetes mellitus with unspecified complications: Secondary | ICD-10-CM

## 2014-06-25 DIAGNOSIS — J4489 Other specified chronic obstructive pulmonary disease: Secondary | ICD-10-CM

## 2014-06-25 DIAGNOSIS — J449 Chronic obstructive pulmonary disease, unspecified: Secondary | ICD-10-CM

## 2014-06-25 DIAGNOSIS — I10 Essential (primary) hypertension: Secondary | ICD-10-CM

## 2014-06-25 LAB — BASIC METABOLIC PANEL
BUN: 11 mg/dL (ref 6–23)
CHLORIDE: 102 meq/L (ref 96–112)
CO2: 26 mEq/L (ref 19–32)
Calcium: 9.2 mg/dL (ref 8.4–10.5)
Creatinine, Ser: 1.2 mg/dL (ref 0.4–1.5)
GFR: 81.47 mL/min (ref >60.00)
GLUCOSE: 89 mg/dL (ref 70–99)
Potassium: 4.5 mEq/L (ref 3.5–5.1)
Sodium: 137 mEq/L (ref 135–145)

## 2014-06-25 LAB — HEMOGLOBIN A1C: Hgb A1c MFr Bld: 6.9 % — ABNORMAL HIGH (ref 4.6–6.5)

## 2014-06-25 MED ORDER — INSULIN PEN NEEDLE 32G X 6 MM MISC: Status: DC

## 2014-06-25 MED ORDER — FLUTICASONE-SALMETEROL 100-50 MCG/DOSE IN AEPB: INHALATION_SPRAY | RESPIRATORY_TRACT | Status: DC

## 2014-06-25 NOTE — Patient Instructions (Addendum)
   Please decrease the Crestor to one half pill daily and recheck your fasting cholesterol studies in 10-12 weeks.  Please think about quitting smoking. Review the risks we discussed. Please call 1-800-QUIT-NOW (502) [phone]) for free smoking cessation counseling. There are multiple options are to help you stop smoking. These include nicotine patches, nicotine gum, and the new "E cigarette".    Use an anti-inflammatory cream such as Aspercreme or Zostrix cream twice a day to the affected area as needed. In lieu of this warm moist compresses or  hot water bottle can be used. Do not apply ice .

## 2014-06-25 NOTE — Progress Notes (Signed)
Pre visit review using our clinic review tool, if applicable. No additional management support is needed unless otherwise documented below in the visit note. 

## 2014-06-25 NOTE — Progress Notes (Signed)
   Subjective:    Patient ID: Neil Carroll, male    DOB: 1951-05-02, 63 y.o.   MRN: [id number]  HPI  He is here to assess active health issues & conditions.  He does not monitor his blood pressure  . He does have occasional chest tightness at rest. He is followed by Dr. Burt Knack, cardiologist  He is on a low-salt diet ,not salt abstinent diet.  He'll exercise on a treadmill 2-3 times per week for 4-5 minutes at a time. His claudication compromises his performance. He feels that the Pletal has been of benefit for his claudication. No other CV symptoms of significance .  He has been on Crestor 20 mg daily; his LDL remains at 40. HDL is 30.5. Triglycerides have been within normal limits  He is followed Dr. Dwyane Dee for his diabetes. There is been dramatic improvement in his A1c. Values dropped from 8.6% to 7%.   He is requesting a refill of his Advair. Unfortunately he continues to smoke.   Review of Systems   Exertional chest pain, palpitations, tachycardia, exertional dyspnea, paroxysmal nocturnal dyspnea,  or edema are absent.  As of last week he's developed some right parasternal chest discomfort which is tender to palpation. There was no trigger such as repetitive motion or local trauma as a cause  He has a new girlfriend. ED is of some concern       Objective:   Physical Exam  Pertinent or positive findings include: Pattern alopecia is present. He is markedly hoarse Breath sounds are decreased; there is no increased work of breathing. He has localized tenderness over the right mid parasternal area. This is accentuated by posterior rotation of the upper extremities Abdomen is protuberant; is dullness to percussion right quadrant. Pedal pulses are decreased but equal. 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 Onycholysis and psoriatic changes in nails are present.  General appearance :adequately nourished; in no distress. Eyes: No conjunctival inflammation  or scleral icterus is present. Oral exam: Lips and gums are healthy appearing.There is no oropharyngeal erythema or exudate noted.  Heart:  Normal rate and regular rhythm. S1 and S2 normal without gallop, murmur, click, rub or other extra sounds   Lungs: no wheezes, rhonchi,rales ,or rubs present.No increased work of breathing.  Abdomen: bowel sounds normal, soft and non-tender without masses, organomegaly or hernias noted.  No guarding or rebound.  Vascular :  no bruits present. Skin:Warm & dry.  No jaundice or tenting Lymphatic: No lymphadenopathy is noted about the head, neck, axilla           Assessment & Plan:  #1 See Current Assessment & Plan in Problem List under specific Diagnosis #2 costochondritis #3 ED; role of smoking discussed

## 2014-06-25 NOTE — Assessment & Plan Note (Signed)
Advair renewed  Smoking risk discussed

## 2014-06-25 NOTE — Assessment & Plan Note (Signed)
Decrease Crestor 20 mg to one half pill daily  Recheck fasting lipids and liver panel in 10-12 weeks

## 2014-06-25 NOTE — Assessment & Plan Note (Signed)
Blood pressure appears controlled; no change in medicines

## 2014-06-26 ENCOUNTER — Telehealth: Payer: Self-pay | Admitting: Internal Medicine

## 2014-06-26 NOTE — Telephone Encounter (Signed)
ermmi mailed

## 2014-06-26 NOTE — Telephone Encounter (Signed)
emmi mailed  °

## 2014-06-29 ENCOUNTER — Ambulatory Visit (INDEPENDENT_AMBULATORY_CARE_PROVIDER_SITE_OTHER): Payer: Federal, State, Local not specified - PPO | Admitting: Endocrinology

## 2014-06-29 ENCOUNTER — Encounter: Payer: Self-pay | Admitting: Endocrinology

## 2014-06-29 VITALS — BP 124/62 | HR 73 | Temp 97.9°F | Resp 16 | Ht 71.0 in | Wt 263.6 lb

## 2014-06-29 DIAGNOSIS — E669 Obesity, unspecified: Secondary | ICD-10-CM | POA: Insufficient documentation

## 2014-06-29 DIAGNOSIS — E1169 Type 2 diabetes mellitus with other specified complication: Secondary | ICD-10-CM | POA: Insufficient documentation

## 2014-06-29 DIAGNOSIS — E119 Type 2 diabetes mellitus without complications: Secondary | ICD-10-CM

## 2014-06-29 NOTE — Patient Instructions (Addendum)
Keep portions of food small especially fried food  Walk on treadmill daily  Please check blood sugars at least half the time about 2 hours after any meal and 3 times per week on waking up. Please bring blood sugar monitor to each visit

## 2014-06-29 NOTE — Progress Notes (Signed)
Patient ID: Neil Carroll, male   DOB: 04-Oct-1950, 63 y.o.   MRN: [id number]   Reason for Appointment : Followup for Type 2 Diabetes  History of Present Illness          Diagnosis: Type 2 diabetes mellitus, date of diagnosis:  2008     Past history: He has been treated with metformin only for several years and had relatively good control until about a year ago. However had been taking only 1000 mg of metformin. Because of his progressively higher blood sugars he had been started on Levemir insulin since 6/14.  However with this his A1c was still 10.2 in 9/14 and he was referred here  Recent history:   With increasing Victoza to 1.8 mg in 3/15 his weight had come down and his blood sugars had improved   He has had nutritional counseling in 1/15  He does not always consistent with his diet and also may be getting some high fat meals at restaurants periodically Does not always control portions even with taking Victoza He has not lost any weight and has not exercised much His A1c is slightly better and he is again at target of under 7% Despite reminders he is checking his blood sugars only in the morning and only once in the afternoon Previously glucose readings were averaging about 150 after dinner Fasting readings are fairly good with current regimen of Levemir, has reduced it by 4 units since his last visit    INSULIN regimen is described as:  Levemir 22  units in am   Oral hypoglycemic drugs the patient is taking are: Metformin Side effects from medications have been: None Compliance with the medical regimen: Fair   Glucose monitoring:  done one time a day         Glucometer: One Touch.      Blood Glucose readings from meter download:   PREMEAL Breakfast Lunch Dinner Bedtime Overall  Glucose range: 99-163   121     Mean/median:  123     123   Hypoglycemia frequency: Never.           Self-care:   Meals: 3 meals per day. He is eating out only periodically may go to fast food  restaurants 2x weeky Physical activity: exercise: Trying to walk about 5 minutes daily, limited by leg pain         Dietician visit: Most recent: 09/2013                Retinal exam: Most recent: 2 years ago.   Wt Readings from Last 3 Encounters:  06/29/14 263 lb 9.6 oz (119.568 kg)  06/25/14 262 lb 4 oz (118.956 kg)  06/18/14 260 lb (117.935 kg)       Lab Results  Component Value Date   HGBA1C 6.9* 06/25/2014   HGBA1C 7.0* 03/05/2014   HGBA1C 7.8* 12/05/2013   Lab Results  Component Value Date   MICROALBUR 1.4 03/05/2014   LDLCALC 40 03/05/2014   CREATININE 1.2 06/25/2014       Medication List       This list is accurate as of: 06/29/14 10:37 AM.  Always use your most recent med list.               ammonium lactate 12 % cream  Commonly known as:  AMLACTIN  Apply topically as needed.     aspirin 325 MG tablet  Take 325 mg by mouth daily.     cilostazol 100  MG tablet  Commonly known as:  PLETAL  take 1 tablet by mouth twice a day     CRESTOR 20 MG tablet  Generic drug:  rosuvastatin  take 1/2  tablet by mouth once daily     Fish Oil 1000 MG Caps  Take 2 capsules by mouth daily.     Flax Seed Oil 1000 MG Caps  Take 1 capsule by mouth 2 (two) times daily.     Fluticasone-Salmeterol 100-50 MCG/DOSE Aepb  Commonly known as:  ADVAIR DISKUS  inhale 1 dose by mouth twice a day     glucose blood test strip  Commonly known as:  ONE TOUCH ULTRA TEST  Blood Sugar tested daily.  Dx:250.02     halobetasol 0.05 % cream  Commonly known as:  ULTRAVATE  Apply topically 2 (two) times daily.     hydrochlorothiazide 25 MG tablet  Commonly known as:  HYDRODIURIL  take 1 tablet by mouth once daily     Insulin Detemir 100 UNIT/ML Pen  Commonly known as:  LEVEMIR FLEXTOUCH  INJECT 26 UNITS INTO THE SKIN EVERY DAY     Insulin Pen Needle 32G X 6 MM Misc  Commonly known as:  NOVOFINE  Use 2 needles daily with Levimir pen and Victoza pen  DX: 250.02     isoniazid 300 MG  tablet  Commonly known as:  NYDRAZID  Take 300 mg by mouth.     ketoconazole 2 % cream  Commonly known as:  NIZORAL  Apply topically 2 (two) times daily.     Liraglutide 18 MG/3ML Sopn  Inject 1.8 mg into the skin daily.     losartan 100 MG tablet  Commonly known as:  COZAAR  take 1 tablet by mouth once daily     metformin 1000 MG (OSM) 24 hr tablet  Commonly known as:  FORTAMET  take 1 tablet by mouth 2 times a day     metoprolol 100 MG tablet  Commonly known as:  LOPRESSOR  take 1 tablet by mouth twice a day     mupirocin ointment 2 %  Commonly known as:  BACTROBAN  Apply 1 application topically 2 (two) times daily.     ONE TOUCH LANCETS Misc  OneTouch Delica 75Z Sterile Lancets     tamsulosin 0.4 MG Caps capsule  Commonly known as:  FLOMAX  Take 0.4 mg by mouth.     triamcinolone cream 0.1 %  Commonly known as:  KENALOG  Apply topically 2 (two) times daily.     VECTICAL 3 MCG/GM cream  Generic drug:  Calcitriol  Apply topically at bedtime.     VITAMIN B6 PO  Take 25 mg by mouth daily.        Allergies:  Allergies  Allergen Reactions  . Niacin Other (See Comments)    REACTION: upset stomach    Past Medical History  Diagnosis Date  . Other peripheral vascular disease(443.89)     bilateral lower extremity  . Hyperlipidemia   . Hypertension   . Transient global amnesia   . Contact dermatitis and other eczema due to plants (except food)   . Tobacco use disorder     dependent  . Atrial flutter     onset  2011. s/p EPS/RFA 12/2011  . Type II diabetes mellitus   . Chronic bronchitis   . GERD (gastroesophageal reflux disease)   . LV dysfunction     EF 45-50% 12/2011    Past Surgical History  Procedure  Laterality Date  . Partial hip arthroplasty  01/2000    "Right; replaced ball & stem"  . Partial hip arthroplasty  01/2000    Right  . Cystoscopy  11/2007    Dr Jeffie Pollock  . Joint replacement    . Cardiac electrophysiology mapping and ablation   03/2010  . Tee without cardioversion  12/28/2011    Procedure: TRANSESOPHAGEAL ECHOCARDIOGRAM (TEE);  Surgeon: Larey Dresser, MD;  Location: [facility];  Service: Cardiovascular;  Laterality: N/A;  . Colonoscopy with polypectomy  2006 & 2011    Dr [name]    Family History  Problem Relation Age of Onset  . Stroke Mother 65  . Hypertension Mother   . Diabetes Mother   . Lung cancer Father     smoker  . Diabetes Paternal Grandmother   . Diabetes Brother     MI @ 52  . Diabetes Brother     Hepatitis C  . Throat cancer Paternal Uncle     1/2 uncle  . Heart attack Brother 79  . Colon cancer Neg Hx   . Crohn's disease Son     Social History:  reports that he has been smoking Cigarettes.  He has a 44 pack-year smoking history. He has never used smokeless tobacco. He reports that he drinks alcohol. He reports that he does not use illicit drugs.    Review of Systems       Lipids: He has persistently low HDL, LDL well controlled, taking Crestor  Lab Results  Component Value Date   CHOL 88 03/05/2014   HDL 30.50* 03/05/2014   LDLCALC 40 03/05/2014   TRIG 90.0 03/05/2014   CHOLHDL 3 03/05/2014              Has occasional  history of Numbness, tingling in the right first or second toes, no burning in feet, on B6 Foot exam done in 10/15  He continues to have significant problem with his psoriasis, asking about his liver functions  Has had claudication-like symptoms but he thinks he can walk somewhat more on the treadmill now   LABS:  Appointment on 06/25/2014  Component Date Value Ref Range Status  . Hemoglobin A1C 06/25/2014 6.9* 4.6 - 6.5 % Final   Glycemic Control Guidelines for People with Diabetes:Non Diabetic:  <6%Goal of Therapy: <7%Additional Action Suggested:  >8%   . Sodium 06/25/2014 137  135 - 145 mEq/L Final  . Potassium 06/25/2014 4.5  3.5 - 5.1 mEq/L Final  . Chloride 06/25/2014 102  96 - 112 mEq/L Final  . CO2 06/25/2014 26  19 - 32 mEq/L Final  . Glucose, Bld  06/25/2014 89  70 - 99 mg/dL Final  . BUN 06/25/2014 11  6 - 23 mg/dL Final  . Creatinine, Ser 06/25/2014 1.2  0.4 - 1.5 mg/dL Final  . Calcium 06/25/2014 9.2  8.4 - 10.5 mg/dL Final  . GFR 06/25/2014 81.47  >60.00 mL/min Final     Physical Examination:  BP 124/62  Pulse 73  Temp(Src) 97.9 F (36.6 C)  Resp 16  Ht 5\' 11"  (1.803 m)  Wt 263 lb 9.6 oz (119.568 kg)  BMI 36.78 kg/m2  SpO2 97%    Trace pedal edema present  Diabetic foot exam shows normal monofilament sensation in the toes and plantar surfaces, no skin lesions or ulcers on the feet and absent pedal pulses   ASSESSMENT:  Diabetes type 2, uncontrolled     He has had fairly good blood sugar control since  his Victoza was increased to 1.8 mg along with continuing his metformin and basal insulin; A1c is down to 6.9 %  Now Blood sugars are fairly stable in the morning but he has not been any readings at night despite instructions   PLAN:   Continue 1.8 mg Victoza No change in Levemir and will continue 22 units as long as fasting readings are consistent More readings after meals and may alternate morning and after dinner readings Increase walking as tolerated on the treadmill Better control of portions and reduce amount of high fat foods especially when eating out Discussed need for weight loss Followup eye exam   Neil Carroll 06/29/2014, 10:37 AM

## 2014-07-20 ENCOUNTER — Other Ambulatory Visit: Payer: Self-pay | Admitting: Internal Medicine

## 2014-08-14 ENCOUNTER — Other Ambulatory Visit: Payer: Self-pay | Admitting: Cardiovascular Disease

## 2014-08-30 ENCOUNTER — Encounter (HOSPITAL_COMMUNITY): Payer: Self-pay | Admitting: Internal Medicine

## 2014-09-17 ENCOUNTER — Other Ambulatory Visit: Payer: Self-pay | Admitting: Endocrinology

## 2014-09-24 ENCOUNTER — Ambulatory Visit (INDEPENDENT_AMBULATORY_CARE_PROVIDER_SITE_OTHER): Payer: Federal, State, Local not specified - PPO | Admitting: Internal Medicine

## 2014-09-24 ENCOUNTER — Encounter: Payer: Self-pay | Admitting: Internal Medicine

## 2014-09-24 ENCOUNTER — Other Ambulatory Visit (INDEPENDENT_AMBULATORY_CARE_PROVIDER_SITE_OTHER): Payer: Federal, State, Local not specified - PPO

## 2014-09-24 VITALS — BP 132/76 | HR 75 | Temp 98.3°F | Ht 71.0 in | Wt 263.4 lb

## 2014-09-24 DIAGNOSIS — E785 Hyperlipidemia, unspecified: Secondary | ICD-10-CM

## 2014-09-24 DIAGNOSIS — I10 Essential (primary) hypertension: Secondary | ICD-10-CM

## 2014-09-24 LAB — LIPID PANEL
Cholesterol: 91 mg/dL (ref 0–200)
HDL: 28.2 mg/dL — ABNORMAL LOW (ref >39.00)
LDL CALC: 43 mg/dL (ref 0–99)
NonHDL: 62.8
Total CHOL/HDL Ratio: 3
Triglycerides: 97 mg/dL (ref 0.0–149.0)
VLDL: 19.4 mg/dL (ref 0.0–40.0)

## 2014-09-24 NOTE — Patient Instructions (Addendum)
Your next office appointment will be determined based upon review of your pending labs . Those instructions will be transmitted to you by mail. Minimal Blood Pressure Goal= AVERAGE < 140/90;  Ideal is an AVERAGE < 135/85. This AVERAGE should be calculated from @ least 5-7 BP readings taken @ different times of day on different days of week. You should not respond to isolated BP readings , but rather the AVERAGE for that week .Please bring your  blood pressure cuff to office visits to verify that it is reliable.It  can also be checked against the blood pressure device at the pharmacy. Finger or wrist cuffs are not dependable; an arm cuff is.  Please think about quitting smoking. Review the risks we discussed. Please call 1-800-QUIT-NOW (412)809-3642) for free smoking cessation counseling. There are multiple options are to help you stop smoking. These include nicotine patches, nicotine gum, and the new "E cigarette".

## 2014-09-24 NOTE — Progress Notes (Signed)
Pre visit review using our clinic review tool, if applicable. No additional management support is needed unless otherwise documented below in the visit note. 

## 2014-09-24 NOTE — Assessment & Plan Note (Signed)
Blood pressure goals reviewed.

## 2014-09-24 NOTE — Assessment & Plan Note (Signed)
Lipids 

## 2014-09-24 NOTE — Progress Notes (Signed)
   Subjective:    Patient ID: Neil Carroll, male    DOB: [birth year], 64 y.o.   MRN: [id number]  HPI  He has been compliant with his medicines without adverse effects. Crestor was decreased to 1/2 pill daily several months ago.    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  He does have occasional edema during the day which resolves overnight  He's exercising on a treadmill 2-3 times per week for 4-5 minutes. He has to stop because of some claudication symptoms in his legs.  Review of Systems  Significant headaches, epistaxis, chest pain, palpitations, exertional dyspnea,or paroxysmal nocturnal dyspnea absent. No GI symptoms , memory loss or myalgias        Objective:   Physical Exam  Pertinent or positive findings include: Pattern alopecia. Goatee. Hoarse voice. Teeth are tobacco stained He has coarse rhonchi in all lung fields Clubbing of the nailbeds suggested Pedal pulses are equal but decreased. The skin over the shins is shiny and somewhat sclerotic in appearance.  General appearance :adequately nourished; in no distress. Eyes: No conjunctival inflammation or scleral icterus is present. Oral exam:Lips and gums are healthy appearing.There is no oropharyngeal erythema or exudate noted.  Heart:  Normal rate and regular rhythm. S1 and S2 normal without gallop, murmur, click, rub or other extra sounds  Lungs:.No increased work of breathing.  Abdomen: bowel sounds normal, soft and non-tender without masses, organomegaly or hernias noted.  No guarding or rebound.  Vascular : all pulses equal ; no bruits present. Skin:Warm & dry.  Intact without suspicious lesions or rashes ; no tenting Lymphatic: No lymphadenopathy is noted about the head, neck, axilla          Assessment & Plan:  See Current Assessment & Plan in Problem List under specific Diagnosis

## 2014-10-30 ENCOUNTER — Other Ambulatory Visit: Payer: Federal, State, Local not specified - PPO

## 2014-10-30 ENCOUNTER — Other Ambulatory Visit (INDEPENDENT_AMBULATORY_CARE_PROVIDER_SITE_OTHER): Payer: Federal, State, Local not specified - PPO

## 2014-10-30 DIAGNOSIS — E669 Obesity, unspecified: Secondary | ICD-10-CM

## 2014-10-30 DIAGNOSIS — E119 Type 2 diabetes mellitus without complications: Secondary | ICD-10-CM

## 2014-10-30 LAB — HEMOGLOBIN A1C: Hgb A1c MFr Bld: 7.3 % — ABNORMAL HIGH (ref 4.6–6.5)

## 2014-10-30 LAB — COMPREHENSIVE METABOLIC PANEL
ALT: 33 U/L (ref 0–53)
AST: 25 U/L (ref 0–37)
Albumin: 4.1 g/dL (ref 3.5–5.2)
Alkaline Phosphatase: 53 U/L (ref 39–117)
BUN: 15 mg/dL (ref 6–23)
CHLORIDE: 105 meq/L (ref 96–112)
CO2: 26 mEq/L (ref 19–32)
CREATININE: 1.27 mg/dL (ref 0.40–1.50)
Calcium: 9.7 mg/dL (ref 8.4–10.5)
GFR: 73.3 mL/min (ref >60.00)
GLUCOSE: 143 mg/dL — AB (ref 70–99)
Potassium: 4.2 mEq/L (ref 3.5–5.1)
Sodium: 140 mEq/L (ref 135–145)
Total Bilirubin: 0.3 mg/dL (ref 0.2–1.2)
Total Protein: 7.5 g/dL (ref 6.0–8.3)

## 2014-11-02 ENCOUNTER — Encounter: Payer: Self-pay | Admitting: Endocrinology

## 2014-11-02 ENCOUNTER — Other Ambulatory Visit: Payer: Self-pay | Admitting: *Deleted

## 2014-11-02 ENCOUNTER — Ambulatory Visit (INDEPENDENT_AMBULATORY_CARE_PROVIDER_SITE_OTHER): Payer: Federal, State, Local not specified - PPO | Admitting: Endocrinology

## 2014-11-02 VITALS — BP 130/69 | HR 86 | Temp 98.0°F | Resp 14 | Ht 71.0 in | Wt 263.0 lb

## 2014-11-02 DIAGNOSIS — E1165 Type 2 diabetes mellitus with hyperglycemia: Secondary | ICD-10-CM

## 2014-11-02 DIAGNOSIS — IMO0002 Reserved for concepts with insufficient information to code with codable children: Secondary | ICD-10-CM

## 2014-11-02 MED ORDER — LIRAGLUTIDE 18 MG/3ML ~~LOC~~ SOPN: 1.8000 mg | PEN_INJECTOR | Freq: Every day | SUBCUTANEOUS | Status: DC

## 2014-11-02 NOTE — Patient Instructions (Addendum)
Levemir 24 units  Check some sugars at bedtime and call if sugar > 180 mostly  Exercise

## 2014-11-02 NOTE — Progress Notes (Signed)
Patient ID: Neil Carroll, male   DOB: June 04, 1951, 64 y.o.   MRN: [id number]   Reason for Appointment : Followup for Type 2 Diabetes  History of Present Illness          Diagnosis: Type 2 diabetes mellitus, date of diagnosis:  2008     Past history: He has been treated with metformin only for several years and had relatively good control until about a year ago. However had been taking only 1000 mg of metformin. Because of his progressively higher blood sugars he had been started on Levemir insulin since 6/14.  However with this his A1c was still 10.2 in 9/14 and he was referred here  Recent history:   With increasing Victoza to 1.8 mg in 3/15 his weight had come down and his blood sugars had improved  Although his A1c had been consistent in 2015 it is relatively higher at 7.3 now He has been checking his blood sugars only in the morning despite constant reminders about doing postprandial readings. Also does appear to have relatively higher icing readings compared to his previous visit without any changes in insulin or metformin He is still taking Victoza 1.8 mg without side effects  He is not always consistent with his diet, eating more meats.  Also tending to eat out more   He has not lost any weight and has not exercised much His A1c is slightly better and he is again at target of under 7% Despite reminders he is checking his blood sugars only in the morning and only once in the afternoon Previously glucose readings were averaging about 150 after dinner Fasting readings are fairly good with current regimen of Levemir, has reduced it by 4 units since his last visit    INSULIN regimen is described as:  Levemir 22  units in am   Oral hypoglycemic drugs the patient is taking are: Metformin 2g Side effects from medications have been: None Compliance with the medical regimen: Fair   Glucose monitoring:  done one time a day         Glucometer: One Touch.      Blood  Glucose readings from meter download:   PRE-MEAL Breakfast Lunch  2 PM Bedtime Overall  Glucose range:  102-158    90, 174     median:  135     137   Hypoglycemia frequency: Never.           Self-care:   Meals: 3 meals per day. He is eating out more regularly, may go to fast food restaurants    Physical activity: exercise: not able to walk; limited by leg pain         Dietician visit: Most recent: 09/2013                Retinal exam: Most recent: 2 years ago.   Wt Readings from Last 3 Encounters:  11/02/14 263 lb (119.296 kg)  09/24/14 263 lb 6 oz (119.466 kg)  06/29/14 263 lb 9.6 oz (119.568 kg)       Lab Results  Component Value Date   HGBA1C 7.3* 10/30/2014   HGBA1C 6.9* 06/25/2014   HGBA1C 7.0* 03/05/2014   Lab Results  Component Value Date   MICROALBUR 1.4 03/05/2014   LDLCALC 43 09/24/2014   CREATININE 1.27 10/30/2014   Appointment on 10/30/2014  Component Date Value Ref Range Status  . Hgb A1c MFr Bld 10/30/2014 7.3* 4.6 - 6.5 % Final   Glycemic Control  Guidelines for People with Diabetes:Non Diabetic:  <6%Goal of Therapy: <7%Additional Action Suggested:  >8%   . Sodium 10/30/2014 140  135 - 145 mEq/L Final  . Potassium 10/30/2014 4.2  3.5 - 5.1 mEq/L Final  . Chloride 10/30/2014 105  96 - 112 mEq/L Final  . CO2 10/30/2014 26  19 - 32 mEq/L Final  . Glucose, Bld 10/30/2014 143* 70 - 99 mg/dL Final  . BUN 10/30/2014 15  6 - 23 mg/dL Final  . Creatinine, Ser 10/30/2014 1.27  0.40 - 1.50 mg/dL Final  . Total Bilirubin 10/30/2014 0.3  0.2 - 1.2 mg/dL Final  . Alkaline Phosphatase 10/30/2014 53  39 - 117 U/L Final  . AST 10/30/2014 25  0 - 37 U/L Final  . ALT 10/30/2014 33  0 - 53 U/L Final  . Total Protein 10/30/2014 7.5  6.0 - 8.3 g/dL Final  . Albumin 10/30/2014 4.1  3.5 - 5.2 g/dL Final  . Calcium 10/30/2014 9.7  8.4 - 10.5 mg/dL Final  . GFR 10/30/2014 73.30  >60.00 mL/min Final       Medication List       This list is accurate as of: 11/02/14 10:09  AM.  Always use your most recent med list.               ammonium lactate 12 % cream  Commonly known as:  AMLACTIN  Apply topically as needed.     aspirin 325 MG tablet  Take 325 mg by mouth daily.     cilostazol 100 MG tablet  Commonly known as:  PLETAL  take 1 tablet by mouth twice a day     CRESTOR 20 MG tablet  Generic drug:  rosuvastatin  take 1/2  tablet by mouth once daily     Fish Oil 1000 MG Caps  Take 2 capsules by mouth daily.     Flax Seed Oil 1000 MG Caps  Take 1 capsule by mouth 2 (two) times daily.     Fluticasone-Salmeterol 100-50 MCG/DOSE Aepb  Commonly known as:  ADVAIR DISKUS  inhale 1 dose by mouth twice a day     glucose blood test strip  Commonly known as:  ONE TOUCH ULTRA TEST  Use to test blood sugar daily ICDE11 69     halobetasol 0.05 % cream  Commonly known as:  ULTRAVATE  Apply topically 2 (two) times daily.     hydrochlorothiazide 25 MG tablet  Commonly known as:  HYDRODIURIL  take 1 tablet by mouth once daily     Insulin Pen Needle 32G X 6 MM Misc  Commonly known as:  NOVOFINE  Use 2 needles daily with Levimir pen and Victoza pen  DX: 250.02     isoniazid 300 MG tablet  Commonly known as:  NYDRAZID  Take 300 mg by mouth.     ketoconazole 2 % cream  Commonly known as:  NIZORAL  Apply topically 2 (two) times daily.     LEVEMIR FLEXTOUCH 100 UNIT/ML Pen  Generic drug:  Insulin Detemir  INJECT 26 UNITS INTO THE   SKIN DAILY     Liraglutide 18 MG/3ML Sopn  Inject 1.8 mg into the skin daily.     losartan 100 MG tablet  Commonly known as:  COZAAR  take 1 tablet by mouth once daily     metformin 1000 MG (OSM) 24 hr tablet  Commonly known as:  FORTAMET  TAKE 1 TABLET TWICE A DAY     metoprolol 100  MG tablet  Commonly known as:  LOPRESSOR  take 1 tablet by mouth twice a day     mupirocin ointment 2 %  Commonly known as:  BACTROBAN  Apply 1 application topically 2 (two) times daily.     ONE TOUCH LANCETS Misc  OneTouch  Delica 40J Sterile Lancets     tamsulosin 0.4 MG Caps capsule  Commonly known as:  FLOMAX  Take 0.4 mg by mouth.     triamcinolone cream 0.1 %  Commonly known as:  KENALOG  Apply topically 2 (two) times daily.     VECTICAL 3 MCG/GM cream  Generic drug:  Calcitriol  Apply topically at bedtime.     VITAMIN B6 PO  Take 25 mg by mouth daily.        Allergies:  Allergies  Allergen Reactions  . Niacin Other (See Comments)    REACTION: upset stomach    Past Medical History  Diagnosis Date  . Other peripheral vascular disease(443.89)     bilateral lower extremity  . Hyperlipidemia   . Hypertension   . Transient global amnesia   . Contact dermatitis and other eczema due to plants (except food)   . Tobacco use disorder     dependent  . Atrial flutter     onset  2011. s/p EPS/RFA 12/2011  . Type II diabetes mellitus   . Chronic bronchitis   . GERD (gastroesophageal reflux disease)   . LV dysfunction     EF 45-50% 12/2011    Past Surgical History  Procedure Laterality Date  . Partial hip arthroplasty  01/2000    "Right; replaced ball & stem"  . Partial hip arthroplasty  01/2000    Right  . Cystoscopy  11/2007    Dr Jeffie Pollock  . Joint replacement    . Cardiac electrophysiology mapping and ablation  03/2010  . Tee without cardioversion  12/28/2011    Procedure: TRANSESOPHAGEAL ECHOCARDIOGRAM (TEE);  Surgeon: Larey Dresser, MD;  Location: [place];  Service: Cardiovascular;  Laterality: N/A;  . Colonoscopy with polypectomy  2006 & 2011    Dr [name]  . A flutter ablation N/A 12/28/2011    Procedure: ABLATION A FLUTTER;  Surgeon: Evans Lance, MD;  Location: [facility] C[facility];  Service: Cardiovascular;  Laterality: N/A;    Family History  Problem Relation Age of Onset  . Stroke Mother 72  . Hypertension Mother   . Diabetes Mother   . Lung cancer Father     smoker  . Diabetes Paternal Grandmother   . Diabetes Brother     MI @ 43  . Diabetes Brother     Hepatitis C  .  Throat cancer Paternal Uncle     1/2 uncle  . Heart attack Brother 26  . Colon cancer Neg Hx   . Crohn's disease Son     Social History:  reports that he has been smoking Cigarettes.  He has a 44 pack-year smoking history. He has never used smokeless tobacco. He reports that he drinks alcohol. He reports that he does not use illicit drugs.    Review of Systems       Lipids: He has persistently low HDL, LDL well controlled, taking Crestor  Lab Results  Component Value Date   CHOL 91 09/24/2014   HDL 28.20* 09/24/2014   LDLCALC 43 09/24/2014   TRIG 97.0 09/24/2014   CHOLHDL 3 09/24/2014              Has occasional  history of Numbness, tingling in the right first or second toes, no burning in feet, on B6 Foot exam done in 10/15 Diabetic foot exam showed normal monofilament sensation in the toes and plantar surfaces, no skin lesions or ulcers on the feet and absent pedal pulses     LABS:  Appointment on 10/30/2014  Component Date Value Ref Range Status  . Hgb A1c MFr Bld 10/30/2014 7.3* 4.6 - 6.5 % Final   Glycemic Control Guidelines for People with Diabetes:Non Diabetic:  <6%Goal of Therapy: <7%Additional Action Suggested:  >8%   . Sodium 10/30/2014 140  135 - 145 mEq/L Final  . Potassium 10/30/2014 4.2  3.5 - 5.1 mEq/L Final  . Chloride 10/30/2014 105  96 - 112 mEq/L Final  . CO2 10/30/2014 26  19 - 32 mEq/L Final  . Glucose, Bld 10/30/2014 143* 70 - 99 mg/dL Final  . BUN 10/30/2014 15  6 - 23 mg/dL Final  . Creatinine, Ser 10/30/2014 1.27  0.40 - 1.50 mg/dL Final  . Total Bilirubin 10/30/2014 0.3  0.2 - 1.2 mg/dL Final  . Alkaline Phosphatase 10/30/2014 53  39 - 117 U/L Final  . AST 10/30/2014 25  0 - 37 U/L Final  . ALT 10/30/2014 33  0 - 53 U/L Final  . Total Protein 10/30/2014 7.5  6.0 - 8.3 g/dL Final  . Albumin 10/30/2014 4.1  3.5 - 5.2 g/dL Final  . Calcium 10/30/2014 9.7  8.4 - 10.5 mg/dL Final  . GFR 10/30/2014 73.30  >60.00 mL/min Final     Physical  Examination:  BP 130/69 mmHg  Pulse 86  Temp(Src) 98 F (36.7 C)  Resp 14  Ht 5\' 11"  (1.803 m)  Wt 263 lb (119.296 kg)  BMI 36.70 kg/m2  SpO2 94%      ASSESSMENT:  Diabetes type 2, uncontrolled     He has had relatively high blood sugars with A1c over 7% and morning sugars somewhat higher. This is most likely related to his inconsistent diet and eating out more. Not able to exercise much Previously has Victoza was increased to 1.8 mg along with continuing his metformin and basal insulin;  Blood sugars are being checked mostly in the morning and he has not been any readings at night despite instructions   PLAN:   Better compliance with diet and exercise Levemir 24 units daily Consider mealtime insulin coverage if postprandial readings are consistently high  Patient Instructions   Levemir 24 units  Check some sugars at bedtime and call if sugar > 180 mostly  Exercise      Eriel Dunckel 11/02/2014, 10:09 AM

## 2014-11-12 ENCOUNTER — Other Ambulatory Visit: Payer: Self-pay | Admitting: Internal Medicine

## 2014-11-13 ENCOUNTER — Encounter: Payer: Self-pay | Admitting: Gastroenterology

## 2014-11-19 ENCOUNTER — Other Ambulatory Visit: Payer: Self-pay | Admitting: Internal Medicine

## 2014-11-23 ENCOUNTER — Encounter: Payer: Self-pay | Admitting: Gastroenterology

## 2015-01-11 ENCOUNTER — Telehealth: Payer: Self-pay | Admitting: *Deleted

## 2015-01-11 ENCOUNTER — Encounter: Payer: Self-pay | Admitting: Gastroenterology

## 2015-01-11 ENCOUNTER — Ambulatory Visit (INDEPENDENT_AMBULATORY_CARE_PROVIDER_SITE_OTHER): Payer: Federal, State, Local not specified - PPO | Admitting: Gastroenterology

## 2015-01-11 VITALS — BP 142/68 | HR 88 | Ht 70.5 in | Wt 269.0 lb

## 2015-01-11 DIAGNOSIS — R112 Nausea with vomiting, unspecified: Secondary | ICD-10-CM

## 2015-01-11 DIAGNOSIS — Z86010 Personal history of colonic polyps: Secondary | ICD-10-CM | POA: Diagnosis not present

## 2015-01-11 MED ORDER — NA SULFATE-K SULFATE-MG SULF 17.5-3.13-1.6 GM/177ML PO SOLN: 1.0000 | Freq: Once | ORAL | Status: DC

## 2015-01-11 NOTE — Assessment & Plan Note (Signed)
Intermittent postprandial nausea vomiting may be do to gastroparesis.  Recommendations #1 gastric emptying scan

## 2015-01-11 NOTE — Telephone Encounter (Signed)
  01/11/2015   RE: Neil Carroll DOB: May 15, 1951 MRN: [id number]   Dear  Linna Darner,    We have scheduled the above patient for an endoscopic procedure. Our records show that he is on anticoagulation therapy.   Please advise as to how long the patient may come off his therapy of Pletal prior to the procedure, which is scheduled for 03/04/2015.  Please fax back/ or route the completed form to [facility] at [phone].   Sincerely,    Genella Mech

## 2015-01-11 NOTE — Patient Instructions (Addendum)
You have been given a separate informational sheet regarding your tobacco use, the importance of quitting and local resources to help you quit.  You have been scheduled for a colonoscopy. Please follow written instructions given to you at your visit today.  Please pick up your prep supplies at the pharmacy within the next 1-3 days. If you use inhalers (even only as needed), please bring them with you on the day of your procedure. Your physician has requested that you go to www.startemmi.com and enter the access code given to you at your visit today. This web site gives a general overview about your procedure. However, you should still follow specific instructions given to you by our office regarding your preparation for the procedure.  You have been scheduled for a gastric emptying scan at [facility] on 01/30/2015 at 9:30am. Please arrive at least 15 minutes prior to your appointment for registration. Please make certain not to have anything to eat or drink after midnight the night before your test. Hold all stomach medications (ex: Zofran, phenergan, Reglan) 48 hours prior to your test. If you need to reschedule your appointment, please contact radiology scheduling at [phone]. _____________________________________________________________________ A gastric-emptying study measures how long it takes for food to move through your stomach. There are several ways to measure stomach emptying. In the most common test, you eat food that contains a small amount of radioactive material. A scanner that detects the movement of the radioactive material is placed over your abdomen to monitor the rate at which food leaves your stomach. This test normally takes about 2 hours to complete. _____________________________________________________________________

## 2015-01-11 NOTE — Telephone Encounter (Signed)
Stop Pletal when you need to & restart as possible post procedure. [employer]

## 2015-01-11 NOTE — Progress Notes (Signed)
_                                                                                                                History of Present Illness:  Mr. Neil Carroll is a 64 year old Afro-American male with diabetes, peripheral vascular disease, COPD, history of colon polyps, referred at the request of Dr. Linna Darner for follow-up of colon polyps.  He has a history of recurrent adenomatous polyps.  Last colonoscopy in 2011 again demonstrated polyps which were removed.  His main GI complaint is occasional postprandial fullness with nausea and vomiting.  Past Medical History  Diagnosis Date  . Other peripheral vascular disease(443.89)     bilateral lower extremity  . Hyperlipidemia   . Hypertension   . Transient global amnesia   . Contact dermatitis and other eczema due to plants (except food)   . Tobacco use disorder     dependent  . Atrial flutter     onset  2011. s/p EPS/RFA 12/2011  . Type II diabetes mellitus   . Chronic bronchitis   . GERD (gastroesophageal reflux disease)   . LV dysfunction     EF 45-50% 12/2011  . Adenomatous colon polyp   . COPD (chronic obstructive pulmonary disease)    Past Surgical History  Procedure Laterality Date  . Partial hip arthroplasty Right 01/2000    "Right; replaced ball & stem"  . Partial hip arthroplasty Left   . Cystoscopy  11/2007    Dr Jeffie Pollock  . Cardiac electrophysiology mapping and ablation  03/2010  . Tee without cardioversion  12/28/2011    Procedure: TRANSESOPHAGEAL ECHOCARDIOGRAM (TEE);  Surgeon: Larey Dresser, MD;  Location: [place];  Service: Cardiovascular;  Laterality: N/A;  . Colonoscopy with polypectomy  2006 & 2011    Dr [name]  . A flutter ablation N/A 12/28/2011    Procedure: ABLATION A FLUTTER;  Surgeon: Evans Lance, MD;  Location: [facility] CATH LAB;  Service: Cardiovascular;  Laterality: N/A;   family history includes Crohn's disease in his son; Diabetes in his brother, brother, mother, and paternal grandmother; Heart  attack (age of onset: 34) in his brother; Hypertension in his mother; Lung cancer in his father; Stroke (age of onset: 82) in his mother; Throat cancer in his paternal uncle. There is no history of Colon cancer. Current Outpatient Prescriptions  Medication Sig Dispense Refill  . ammonium lactate (AMLACTIN) 12 % cream Apply topically as needed.    [name] aspirin 325 MG tablet Take 325 mg by mouth daily.    . Calcitriol (VECTICAL) 3 MCG/GM cream Apply topically at bedtime.    . cilostazol (PLETAL) 100 MG tablet take 1 tablet by mouth twice a day 60 tablet 5  . etanercept (ENBREL) 50 MG/ML injection Inject 50 mg into the skin. Twice a week    . Flaxseed, Linseed, (FLAX SEED OIL) 1000 MG CAPS Take 1 capsule by mouth 2 (two) times daily.     . Fluticasone-Salmeterol (ADVAIR DISKUS) 100-50 MCG/DOSE AEPB inhale 1 dose by mouth  twice a day 180 each 3  . glucose blood (ONE TOUCH ULTRA TEST) test strip Use to test blood sugar daily ICDE11 69 100 each 3  . halobetasol (ULTRAVATE) 0.05 % cream Apply topically 2 (two) times daily.    . hydrochlorothiazide (HYDRODIURIL) 25 MG tablet take 1 tablet by mouth once daily 90 tablet 2  . Insulin Pen Needle (NOVOFINE) 32G X 6 MM MISC Use 2 needles daily with Levimir pen and Victoza pen  DX: 250.02 100 each 5  . isoniazid (NYDRAZID) 300 MG tablet Take 300 mg by mouth.    [name] ketoconazole (NIZORAL) 2 % cream Apply topically 2 (two) times daily.      [name] LEVEMIR FLEXTOUCH 100 UNIT/ML Pen INJECT 26 UNITS INTO THE   SKIN DAILY (Patient taking differently: INJECT 22 UNITS INTO THE   SKIN DAILY) 30 mL 1  . Liraglutide 18 MG/3ML SOPN Inject 0.3 mLs (1.8 mg total) into the skin daily. 9 pen 1  . losartan (COZAAR) 100 MG tablet take 1 tablet by mouth once daily 30 tablet 10  . metformin (FORTAMET) 1000 MG (OSM) 24 hr tablet TAKE 1 TABLET TWICE A DAY 180 tablet 1  . metoprolol (LOPRESSOR) 100 MG tablet take 1 tablet by mouth twice a day 180 tablet 2  . mupirocin ointment (BACTROBAN) 2 %  Apply 1 application topically 2 (two) times daily.    . Omega-3 Fatty Acids (FISH OIL) 1000 MG CAPS Take 2 capsules by mouth daily.      Glory Rosebush DELICA LANCETS FINE MISC ICD 10 E11 69 TEST BLOOD SUGAR DAILY 200 each 12  . Pyridoxine HCl (VITAMIN B6 PO) Take 25 mg by mouth daily.    . rosuvastatin (CRESTOR) 20 MG tablet take 1/2  tablet by mouth once daily 90 tablet 3  . tamsulosin (FLOMAX) 0.4 MG CAPS capsule Take 0.4 mg by mouth.    . triamcinolone cream (KENALOG) 0.1 % Apply topically 2 (two) times daily.     No current facility-administered medications for this visit.   Allergies as of 01/11/2015 - Review Complete 01/11/2015  Allergen Reaction Noted  . Niacin Other (See Comments) 03/18/2010    reports that he has been smoking Cigarettes.  He has a 44 pack-year smoking history. He has never used smokeless tobacco. He reports that he drinks alcohol. He reports that he does not use illicit drugs.   Review of Systems: Pertinent positive and negative review of systems were noted in the above HPI section. All other review of systems were otherwise negative.  Vital signs were reviewed in today's medical record Physical Exam: General: Well developed , well nourished, no acute distress Skin: anicteric Head: Normocephalic and atraumatic Eyes:  sclerae anicteric, EOMI Ears: Normal auditory acuity Mouth: No deformity or lesions Neck: Supple, no masses or thyromegaly Lymph Nodes: no lymphadenopathy Lungs: There are diffuse inspiratory and expiratory wheezes Heart: Regular rate and rhythm; no murmurs, rubs or bruits.  Gastroinestinal: Soft, non tender and non distended. No masses, hepatosplenomegaly or hernias noted. Normal Bowel sounds.  There is no succussion splash Rectal:deferred Musculoskeletal: Symmetrical with no gross deformities  Skin: No lesions on visible extremities Pulses:  Absent pedal pulses Extremities: No clubbing.  There are chronic venous stasis changes and 1+ pedal  edema  Neurological: Alert oriented x 4, grossly nonfocal Cervical Nodes:  No significant cervical adenopathy Inguinal Nodes: No significant inguinal adenopathy Psychological:  Alert and cooperative. Normal mood and affect  See Assessment and Plan under Problem List

## 2015-01-11 NOTE — Assessment & Plan Note (Signed)
Patient is due for colonoscopy.  We will check with his PCP whether Pletal can be held.

## 2015-01-11 NOTE — Telephone Encounter (Signed)
Dr [name] please advise how long you want to hold Pletal

## 2015-01-14 NOTE — Telephone Encounter (Signed)
Called patient to inform that he can hold Pletal 5 days.... Patient understands

## 2015-01-14 NOTE — Telephone Encounter (Signed)
5 days

## 2015-01-17 ENCOUNTER — Encounter: Payer: Self-pay | Admitting: Gastroenterology

## 2015-01-29 ENCOUNTER — Ambulatory Visit (INDEPENDENT_AMBULATORY_CARE_PROVIDER_SITE_OTHER): Payer: Federal, State, Local not specified - PPO | Admitting: Internal Medicine

## 2015-01-29 ENCOUNTER — Other Ambulatory Visit (INDEPENDENT_AMBULATORY_CARE_PROVIDER_SITE_OTHER): Payer: Federal, State, Local not specified - PPO

## 2015-01-29 ENCOUNTER — Ambulatory Visit (INDEPENDENT_AMBULATORY_CARE_PROVIDER_SITE_OTHER)
Admission: RE | Admit: 2015-01-29 | Discharge: 2015-01-29 | Disposition: A | Discharge status: Home / Self Care | Payer: Federal, State, Local not specified - PPO | Source: Ambulatory Visit | Attending: Internal Medicine | Admitting: Internal Medicine

## 2015-01-29 ENCOUNTER — Encounter: Payer: Self-pay | Admitting: Internal Medicine

## 2015-01-29 VITALS — BP 140/68 | HR 87 | Temp 98.3°F | Ht 71.0 in | Wt 268.8 lb

## 2015-01-29 DIAGNOSIS — R1011 Right upper quadrant pain: Secondary | ICD-10-CM

## 2015-01-29 DIAGNOSIS — R0789 Other chest pain: Secondary | ICD-10-CM

## 2015-01-29 LAB — CBC WITH DIFFERENTIAL/PLATELET
BASOS ABS: 0 10*3/uL (ref 0.0–0.1)
Basophils Relative: 0.1 % (ref 0.0–3.0)
EOS ABS: 0.2 10*3/uL (ref 0.0–0.7)
Eosinophils Relative: 2.7 % (ref 0.0–5.0)
HEMATOCRIT: 49 % (ref 39.0–52.0)
HEMOGLOBIN: 16.4 g/dL (ref 13.0–17.0)
LYMPHS ABS: 1.9 10*3/uL (ref 0.7–4.0)
Lymphocytes Relative: 21.4 % (ref 12.0–46.0)
MCHC: 33.5 g/dL (ref 30.0–36.0)
MCV: 88.2 fl (ref 78.0–100.0)
MONO ABS: 1 10*3/uL (ref 0.1–1.0)
Monocytes Relative: 11 % (ref 3.0–12.0)
NEUTROS ABS: 5.9 10*3/uL (ref 1.4–7.7)
Neutrophils Relative %: 64.8 % (ref 43.0–77.0)
Platelets: 168 10*3/uL (ref 150.0–400.0)
RBC: 5.55 Mil/uL (ref 4.22–5.81)
RDW: 14.2 % (ref 11.5–15.5)
WBC: 9 10*3/uL (ref 4.0–10.5)

## 2015-01-29 LAB — HEPATIC FUNCTION PANEL
ALT: 36 U/L (ref 0–53)
AST: 22 U/L (ref 0–37)
Albumin: 4 g/dL (ref 3.5–5.2)
Alkaline Phosphatase: 43 U/L (ref 39–117)
BILIRUBIN TOTAL: 0.3 mg/dL (ref 0.2–1.2)
Bilirubin, Direct: 0.1 mg/dL (ref 0.0–0.3)
Total Protein: 7.4 g/dL (ref 6.0–8.3)

## 2015-01-29 LAB — LIPASE: LIPASE: 72 U/L — AB (ref 11.0–59.0)

## 2015-01-29 LAB — AMYLASE: Amylase: 53 U/L (ref 27–131)

## 2015-01-29 NOTE — Progress Notes (Signed)
Pre visit review using our clinic review tool, if applicable. No additional management support is needed unless otherwise documented below in the visit note. 

## 2015-01-29 NOTE — Patient Instructions (Signed)
Use an anti-inflammatory cream such as Aspercreme or Zostrix cream twice a day to the affected area as needed. In lieu of this warm moist compresses or  hot water bottle can be used. Do not apply ice . Your next office appointment will be determined based upon review of your pending labs  and  xrays  Those instructions will be transmitted to you by mail for your records.  Critical results will be called.   Followup as needed for any active or acute issue. Please report any significant change in your symptoms.

## 2015-01-29 NOTE — Progress Notes (Signed)
   Subjective:    Patient ID: Neil Carroll, male    DOB: 06-19-1951, 64 y.o.   MRN: [id number]  HPI  He describes pain in the right upper quadrant/right inferior rib cage since the evening of 5/5 or the morning of 01/25/15. There was no predisposition. He had been riding a mower and working in the yard 3 days earlier. The pain lasts 1-2 seconds. It is aggravated by coughing, blowing his nose, position change, or straining at stool. It is not related to food intake, particularly fatty or greasy food.  The pain is improving without definitive intervention  He has chronic shortness of breath and wheezing which is unchanged. He has no other new upper respiratory tract infection or lower respiratory tract symptoms.  He also denies any GI symptoms other than some nausea for which he takes Tums. He has some imaging studies scheduled to rule out delayed gastric emptying. He also has a follow-up colonoscopy 03/04/15 because of a history of colon polyps 5 years ago.  He continues to smoke a pack a day.  Review of Systems Frontal headache, facial pain , nasal purulence, dental pain, sore throat , otic pain or otic discharge denied. No fever , chills or sweats. Extrinsic symptoms of itchy, watery eyes, sneezing, or angioedema are denied. There is no paroxysmal nocturnal dyspnea. Unexplained weight loss, significant dyspepsia, dysphagia, melena, rectal bleeding, or persistently small caliber stools are denied. Dysuria, pyuria, hematuria, frequency, nocturia or polyuria are denied.    Objective:   Physical Exam Pertinent or positive findings include: Facial plethora is present.  He has pattern alopecia.  There is mild erythema the left tympanic membrane.  He has coarse rhonchi at the bases, this is greater in the right lower lobe than the left.  There is tenderness to deep palpation of the right inferior rib cage.  He has an S4 with a grade 1 systolic murmur.  Abdomen is massive. He has some  tenderness to deep palpation in the right upper quadrant.  He has splotchy hyperpigmentation in an irregular pattern over the abdomen and also the shins.  He has tense edema lower extremities.  Pedal pulses are decreased.  General appearance :adequately nourished; in no distress. Eyes: No conjunctival inflammation or scleral icterus is present. Oral exam:  Lips and gums are healthy appearing.There is moderate oropharyngeal erythema ;no exudate noted.  Heart:  Normal rate and regular rhythm. S1 and S2 normal without gallop,  click, or rub.   Abdomen: bowel sounds normal, soft and non-tender without masses, organomegaly or hernias noted.  No guarding or rebound.  Vascular : all pulses equal ; no bruits present  Skin:Warm & dry.  Intact without suspicious lesions or rashes ; no tenting  Lymphatic: No lymphadenopathy is noted about the head, neck, axilla Neuro: Strength, tone & DTRs normal.         Assessment & Plan:  #1 chest wall pain   #2 right upper quadrant pain  Plan: See orders and recommendations

## 2015-01-30 ENCOUNTER — Ambulatory Visit (HOSPITAL_COMMUNITY)
Admission: RE | Admit: 2015-01-30 | Discharge: 2015-01-30 | Disposition: A | Discharge status: Home / Self Care | Payer: Federal, State, Local not specified - PPO | Source: Ambulatory Visit | Attending: Gastroenterology | Admitting: Gastroenterology

## 2015-01-30 DIAGNOSIS — R112 Nausea with vomiting, unspecified: Secondary | ICD-10-CM

## 2015-01-30 DIAGNOSIS — R109 Unspecified abdominal pain: Secondary | ICD-10-CM | POA: Insufficient documentation

## 2015-01-30 DIAGNOSIS — Z86010 Personal history of colonic polyps: Secondary | ICD-10-CM

## 2015-01-30 MED ORDER — TECHNETIUM TC 99M SULFUR COLLOID: 2.1000 | Freq: Once | INTRAVENOUS | Status: AC | PRN

## 2015-01-31 ENCOUNTER — Ambulatory Visit (INDEPENDENT_AMBULATORY_CARE_PROVIDER_SITE_OTHER): Payer: Federal, State, Local not specified - PPO | Admitting: Endocrinology

## 2015-01-31 ENCOUNTER — Encounter: Payer: Self-pay | Admitting: Endocrinology

## 2015-01-31 VITALS — BP 148/70 | HR 83 | Temp 98.1°F | Resp 14 | Ht 71.0 in | Wt 269.0 lb

## 2015-01-31 DIAGNOSIS — IMO0002 Reserved for concepts with insufficient information to code with codable children: Secondary | ICD-10-CM

## 2015-01-31 DIAGNOSIS — E1165 Type 2 diabetes mellitus with hyperglycemia: Secondary | ICD-10-CM

## 2015-01-31 LAB — COMPREHENSIVE METABOLIC PANEL
ALT: 34 U/L (ref 0–53)
AST: 21 U/L (ref 0–37)
Albumin: 4 g/dL (ref 3.5–5.2)
Alkaline Phosphatase: 42 U/L (ref 39–117)
BUN: 16 mg/dL (ref 6–23)
CO2: 30 mEq/L (ref 19–32)
CREATININE: 1.21 mg/dL (ref 0.40–1.50)
Calcium: 9.7 mg/dL (ref 8.4–10.5)
Chloride: 102 mEq/L (ref 96–112)
GFR: 77.45 mL/min (ref >60.00)
Glucose, Bld: 111 mg/dL — ABNORMAL HIGH (ref 70–99)
Potassium: 3.8 mEq/L (ref 3.5–5.1)
SODIUM: 137 meq/L (ref 135–145)
TOTAL PROTEIN: 7.3 g/dL (ref 6.0–8.3)
Total Bilirubin: 0.3 mg/dL (ref 0.2–1.2)

## 2015-01-31 LAB — URINALYSIS, ROUTINE W REFLEX MICROSCOPIC
Bilirubin Urine: NEGATIVE
Hgb urine dipstick: NEGATIVE
KETONES UR: NEGATIVE
LEUKOCYTES UA: NEGATIVE
Nitrite: NEGATIVE
RBC / HPF: NONE SEEN (ref 0–?)
Specific Gravity, Urine: 1.01 (ref 1.000–1.030)
Total Protein, Urine: NEGATIVE
URINE GLUCOSE: NEGATIVE
UROBILINOGEN UA: 0.2 (ref 0.0–1.0)
pH: 6 (ref 5.0–8.0)

## 2015-01-31 LAB — HEMOGLOBIN A1C: Hgb A1c MFr Bld: 7 % — ABNORMAL HIGH (ref 4.6–6.5)

## 2015-01-31 LAB — MICROALBUMIN / CREATININE URINE RATIO
Creatinine,U: 159.6 mg/dL
Microalb Creat Ratio: 2.6 mg/g (ref 0.0–30.0)
Microalb, Ur: 4.1 mg/dL — ABNORMAL HIGH (ref 0.0–1.9)

## 2015-01-31 NOTE — Patient Instructions (Addendum)
Walk daily  Check blood sugars on waking up ..3  .. times a week Also check blood sugars about 2 hours after a meal and do this after different meals by rotation Recommended blood sugar levels on waking up is 90-130 and about 2 hours after meal is 140-180 Please bring blood sugar monitor to each visit.  Check cost of Lantus or Tresiba insulin, call before refilling Levemir  Reduce fats and sugars in diet

## 2015-01-31 NOTE — Progress Notes (Signed)
Patient ID: Neil Carroll, male   DOB: [birth year], 64 y.o.   MRN: [id number]   Reason for Appointment : Followup for Type 2 Diabetes  History of Present Illness          Diagnosis: Type 2 diabetes mellitus, date of diagnosis:  2008     Past history: He has been treated with metformin only for several years and had relatively good control until about a year ago. However had been taking only 1000 mg of metformin. Because of his progressively higher blood sugars he had been started on Levemir insulin since 6/14.  However with this his A1c was still 10.2 in 9/14 and he was referred here With increasing Victoza to 1.8 mg in 3/15 his weight had come down and his blood sugars had improved   Recent history: Although his A1c had been consistent in 2015 it was  relatively higher at 7.3 in 2/16 No recent A1c available On his last visit he was advised to start watching his diet better and start walking for exercise He has not done much of this and has not lost any weight Glucose monitoring: Not clear how often he is checking his home readings but since he did not bring his monitor unable to assess his level of control also He thinks his blood sugars are fairly good both after breakfast and supper and only rarely high Fasting blood sugars: They are relatively high He is still taking Victoza 1.8 mg without side effects.  He takes this in the morning with his LEVEMIR   He is not always consistent with his diet with some higher fat meals and rarely drinks with sugar but he does not think his diet is off significantly     INSULIN regimen is described as:  Levemir 24  units in am   Oral hypoglycemic drugs the patient is taking are: Metformin 2g Side effects from medications have been: None Compliance with the medical regimen: Fair   Glucose monitoring:  done ? one time a day         Glucometer: One Touch.      Blood Glucose readings from  recall   PRE-MEAL Breakfast Lunch  2 PM pcs  Overall  Glucose range:  130s      <162   median:         Hypoglycemia frequency: Never.           Self-care:   Meals:  He is eating out less regularly   Physical activity: exercise: not motivated to walk         Dietician visit: Most recent: 09/2013                Retinal exam: Most recent: 2 years ago.   Wt Readings from Last 3 Encounters:  01/31/15 269 lb (122.018 kg)  01/29/15 268 lb 12 oz (121.904 kg)  01/11/15 269 lb (122.018 kg)       Lab Results  Component Value Date   HGBA1C 7.3* 10/30/2014   HGBA1C 6.9* 06/25/2014   HGBA1C 7.0* 03/05/2014   Lab Results  Component Value Date   MICROALBUR 1.4 03/05/2014   LDLCALC 43 09/24/2014   CREATININE 1.27 10/30/2014   Appointment on 01/29/2015  Component Date Value Ref Range Status  . Amylase 01/29/2015 53  27 - 131 U/L Final  . Lipase 01/29/2015 72.0* 11.0 - 59.0 U/L Final  . WBC 01/29/2015 9.0  4.0 - 10.5 K/uL Final  . RBC 01/29/2015 5.55  4.22 - 5.81 Mil/uL Final  . Hemoglobin 01/29/2015 16.4  13.0 - 17.0 g/dL Final  . HCT 01/29/2015 49.0  39.0 - 52.0 % Final  . MCV 01/29/2015 88.2  78.0 - 100.0 fl Final  . MCHC 01/29/2015 33.5  30.0 - 36.0 g/dL Final  . RDW 01/29/2015 14.2  11.5 - 15.5 % Final  . Platelets 01/29/2015 168.0  150.0 - 400.0 K/uL Final  . Neutrophils Relative % 01/29/2015 64.8  43.0 - 77.0 % Final  . Lymphocytes Relative 01/29/2015 21.4  12.0 - 46.0 % Final  . Monocytes Relative 01/29/2015 11.0  3.0 - 12.0 % Final  . Eosinophils Relative 01/29/2015 2.7  0.0 - 5.0 % Final  . Basophils Relative 01/29/2015 0.1  0.0 - 3.0 % Final  . Neutro Abs 01/29/2015 5.9  1.4 - 7.7 K/uL Final  . Lymphs Abs 01/29/2015 1.9  0.7 - 4.0 K/uL Final  . Monocytes Absolute 01/29/2015 1.0  0.1 - 1.0 K/uL Final  . Eosinophils Absolute 01/29/2015 0.2  0.0 - 0.7 K/uL Final  . Basophils Absolute 01/29/2015 0.0  0.0 - 0.1 K/uL Final  . Total Bilirubin 01/29/2015 0.3  0.2 - 1.2 mg/dL Final  . Bilirubin, Direct 01/29/2015 0.1  0.0 -  0.3 mg/dL Final  . Alkaline Phosphatase 01/29/2015 43  39 - 117 U/L Final  . AST 01/29/2015 22  0 - 37 U/L Final  . ALT 01/29/2015 36  0 - 53 U/L Final  . Total Protein 01/29/2015 7.4  6.0 - 8.3 g/dL Final  . Albumin 01/29/2015 4.0  3.5 - 5.2 g/dL Final       Medication List       This list is accurate as of: 01/31/15 11:02 AM.  Always use your most recent med list.               ammonium lactate 12 % cream  Commonly known as:  AMLACTIN  Apply topically as needed.     aspirin 325 MG tablet  Take 325 mg by mouth daily.     cilostazol 100 MG tablet  Commonly known as:  PLETAL  take 1 tablet by mouth twice a day     clobetasol 0.05 % topical foam  Commonly known as:  OLUX     CRESTOR 20 MG tablet  Generic drug:  rosuvastatin  take 1/2  tablet by mouth once daily     etanercept 50 MG/ML injection  Commonly known as:  ENBREL  Inject 50 mg into the skin. Twice a week     Fish Oil 1000 MG Caps  Take 2 capsules by mouth daily.     Flax Seed Oil 1000 MG Caps  Take 1 capsule by mouth 2 (two) times daily.     Fluticasone-Salmeterol 100-50 MCG/DOSE Aepb  Commonly known as:  ADVAIR DISKUS  inhale 1 dose by mouth twice a day     glucose blood test strip  Commonly known as:  ONE TOUCH ULTRA TEST  Use to test blood sugar daily ICDE11 69     halobetasol 0.05 % cream  Commonly known as:  ULTRAVATE  Apply topically 2 (two) times daily.     hydrochlorothiazide 25 MG tablet  Commonly known as:  HYDRODIURIL  take 1 tablet by mouth once daily     Insulin Pen Needle 32G X 6 MM Misc  Commonly known as:  NOVOFINE  Use 2 needles daily with Levimir pen and Victoza pen  DX: 250.02     isoniazid  300 MG tablet  Commonly known as:  NYDRAZID  Take 300 mg by mouth.     ketoconazole 2 % cream  Commonly known as:  NIZORAL  Apply topically 2 (two) times daily.     LEVEMIR FLEXTOUCH 100 UNIT/ML Pen  Generic drug:  Insulin Detemir  INJECT 26 UNITS INTO THE   SKIN DAILY      Liraglutide 18 MG/3ML Sopn  Inject 0.3 mLs (1.8 mg total) into the skin daily.     losartan 100 MG tablet  Commonly known as:  COZAAR  take 1 tablet by mouth once daily     metformin 1000 MG (OSM) 24 hr tablet  Commonly known as:  FORTAMET  TAKE 1 TABLET TWICE A DAY     metoprolol 100 MG tablet  Commonly known as:  LOPRESSOR  take 1 tablet by mouth twice a day     mupirocin ointment 2 %  Commonly known as:  BACTROBAN  Apply 1 application topically 2 (two) times daily.     Na Sulfate-K Sulfate-Mg Sulf Soln  Commonly known as:  SUPREP BOWEL PREP  Take 1 kit by mouth once.     ONETOUCH DELICA LANCETS FINE Misc  ICD 10 E11 69 TEST BLOOD SUGAR DAILY     tamsulosin 0.4 MG Caps capsule  Commonly known as:  FLOMAX  Take 0.4 mg by mouth.     triamcinolone cream 0.1 %  Commonly known as:  KENALOG  Apply topically 2 (two) times daily.     VECTICAL 3 MCG/GM cream  Generic drug:  Calcitriol  Apply topically at bedtime.     VITAMIN B6 PO  Take 25 mg by mouth daily.        Allergies:  Allergies  Allergen Reactions  . Niacin Other (See Comments)    REACTION: upset stomach    Past Medical History  Diagnosis Date  . Other peripheral vascular disease(443.89)     bilateral lower extremity  . Hyperlipidemia   . Hypertension   . Transient global amnesia   . Contact dermatitis and other eczema due to plants (except food)   . Tobacco use disorder     dependent  . Atrial flutter     onset  2011. s/p EPS/RFA 12/2011  . Type II diabetes mellitus   . Chronic bronchitis   . GERD (gastroesophageal reflux disease)   . LV dysfunction     EF 45-50% 12/2011  . Adenomatous colon polyp   . COPD (chronic obstructive pulmonary disease)     Past Surgical History  Procedure Laterality Date  . Partial hip arthroplasty Right 01/2000    "Right; replaced ball & stem"  . Partial hip arthroplasty Left   . Cystoscopy  11/2007    Dr Jeffie Pollock  . Cardiac electrophysiology mapping and ablation   03/2010  . Tee without cardioversion  12/28/2011    Procedure: TRANSESOPHAGEAL ECHOCARDIOGRAM (TEE);  Surgeon: Larey Dresser, MD;  Location: [facility];  Service: Cardiovascular;  Laterality: N/A;  . Colonoscopy with polypectomy  2006 & 2011    Dr [name]  . A flutter ablation N/A 12/28/2011    Procedure: ABLATION A FLUTTER;  Surgeon: Evans Lance, MD;  Location: [facility] CATH LAB;  Service: Cardiovascular;  Laterality: N/A;    Family History  Problem Relation Age of Onset  . Stroke Mother 3  . Hypertension Mother   . Diabetes Mother   . Lung cancer Father     smoker  . Diabetes Paternal Grandmother   .  Diabetes Brother     MI @ 28  . Diabetes Brother     Hepatitis C  . Throat cancer Paternal Uncle     1/2 uncle  . Heart attack Brother 35  . Colon cancer Neg Hx   . Crohn's disease Son     Social History:  reports that he has been smoking Cigarettes.  He has a 44 pack-year smoking history. He has never used smokeless tobacco. He reports that he drinks alcohol. He reports that he does not use illicit drugs.    Review of Systems       Lipids: He has persistently low HDL, LDL well controlled with, taking Crestor  Lab Results  Component Value Date   CHOL 91 09/24/2014   HDL 28.20* 09/24/2014   LDLCALC 43 09/24/2014   TRIG 97.0 09/24/2014   CHOLHDL 3 09/24/2014              Has occasional  history of Numbness, tingling in the right first or second toes, no burning in feet, on B6 vitamins OTC Foot exam done in 10/15 Diabetic foot exam showed normal monofilament sensation in the toes and plantar surfaces, no skin lesions or ulcers on the feet and absent pedal pulses    Physical Examination:  BP 148/70 mmHg  Pulse 83  Temp(Src) 98.1 F (36.7 C)  Resp 14  Ht '5\' 11"'  (1.803 m)  Wt 269 lb (122.018 kg)  BMI 37.53 kg/m2  SpO2 92%     No pedal edema  ASSESSMENT:  Diabetes type 2, uncontrolled     He has not been consistently compliant with instructions on starting an  exercise regimen, being consistent with diet for his diabetes management Not able to lose weight Unable to assess his control level as A1c is not available today Although he has checked his readings after meals also on this visit he does not report excessively high readings; previously A1c over 7%  He does need to work on his diet but he does not want to see the dietitian Currently on maximum dose of Victoza and metformin However he may do better with using Lantus or a longer acting insulin to control his fasting readings better  PLAN:   Better compliance with diet and exercise Encouraged him to walk daily Levemir  unchanged at 24 units daily Consider mealtime insulin coverage if postprandial readings are consistently high A1c to be tested today   Patient Instructions  Walk daily  Check blood sugars on waking up ..3  .. times a week Also check blood sugars about 2 hours after a meal and do this after different meals by rotation Recommended blood sugar levels on waking up is 90-130 and about 2 hours after meal is 140-180 Please bring blood sugar monitor to each visit.  Check cost of Lantus or Tresiba insulin, call before refilling Levemir  Reduce fats and sugars in diet      Satrina Magallanes 01/31/2015, 11:02 AM   Note: This office note was prepared with Estate agent. Any transcriptional errors that result from this process are unintentional.

## 2015-01-31 NOTE — Progress Notes (Signed)
Quick Note:  Please inform the patient that GES was normal and to continue current plan of action ______ 

## 2015-02-23 ENCOUNTER — Other Ambulatory Visit: Payer: Self-pay | Admitting: Endocrinology

## 2015-03-04 ENCOUNTER — Ambulatory Visit (AMBULATORY_SURGERY_CENTER): Payer: Federal, State, Local not specified - PPO | Admitting: Gastroenterology

## 2015-03-04 ENCOUNTER — Encounter: Payer: Self-pay | Admitting: Gastroenterology

## 2015-03-04 VITALS — BP 164/86 | HR 69 | Temp 97.8°F | Resp 23 | Ht 70.5 in | Wt 269.0 lb

## 2015-03-04 DIAGNOSIS — Z86010 Personal history of colonic polyps: Secondary | ICD-10-CM

## 2015-03-04 LAB — GLUCOSE, CAPILLARY
GLUCOSE-CAPILLARY: 80 mg/dL (ref 65–99)
Glucose-Capillary: 101 mg/dL — ABNORMAL HIGH (ref 65–99)

## 2015-03-04 MED ORDER — SODIUM CHLORIDE 0.9 % IV SOLN: 500.0000 mL | INTRAVENOUS | Status: DC

## 2015-03-04 NOTE — Patient Instructions (Addendum)
Resume Pletal today   Discharge  instructions given. Normal exam. Resume previous medications. YOU HAD AN ENDOSCOPIC PROCEDURE TODAY AT [facility]:   Refer to the procedure report that was given to you for any specific questions about what was found during the examination.  If the procedure report does not answer your questions, please call your gastroenterologist to clarify.  If you requested that your care partner not be given the details of your procedure findings, then the procedure report has been included in a sealed envelope for you to review at your convenience later.  YOU SHOULD EXPECT: Some feelings of bloating in the abdomen. Passage of more gas than usual.  Walking can help get rid of the air that was put into your GI tract during the procedure and reduce the bloating. If you had a lower endoscopy (such as a colonoscopy or flexible sigmoidoscopy) you may notice spotting of blood in your stool or on the toilet paper. If you underwent a bowel prep for your procedure, you may not have a normal bowel movement for a few days.  Please Note:  You might notice some irritation and congestion in your nose or some drainage.  This is from the oxygen used during your procedure.  There is no need for concern and it should clear up in a day or so.  SYMPTOMS TO REPORT IMMEDIATELY:   Following lower endoscopy (colonoscopy or flexible sigmoidoscopy):  Excessive amounts of blood in the stool  Significant tenderness or worsening of abdominal pains  Swelling of the abdomen that is new, acute  Fever of 100F or higher   For urgent or emergent issues, a gastroenterologist can be reached at any hour by calling [phone].   DIET: Your first meal following the procedure should be a small meal and then it is ok to progress to your normal diet. Heavy or fried foods are harder to digest and may make you feel nauseous or bloated.  Likewise, meals heavy in dairy and vegetables can  increase bloating.  Drink plenty of fluids but you should avoid alcoholic beverages for 24 hours.  ACTIVITY:  You should plan to take it easy for the rest of today and you should NOT DRIVE or use heavy machinery until tomorrow (because of the sedation medicines used during the test).    FOLLOW UP: Our staff will call the number listed on your records the next business day following your procedure to check on you and address any questions or concerns that you may have regarding the information given to you following your procedure. If we do not reach you, we will leave a message.  However, if you are feeling well and you are not experiencing any problems, there is no need to return our call.  We will assume that you have returned to your regular daily activities without incident.  If any biopsies were taken you will be contacted by phone or by letter within the next 1-3 weeks.  Please call us at [phone] if you have not heard about the biopsies in 3 weeks.    SIGNATURES/CONFIDENTIALITY: You and/or your care partner have signed paperwork which will be entered into your electronic medical record.  These signatures attest to the fact that that the information above on your After Visit Summary has been reviewed and is understood.  Full responsibility of the confidentiality of this discharge information lies with you and/or your care-partner.

## 2015-03-04 NOTE — Progress Notes (Signed)
A/ox3, pleased with MAC, report to RN 

## 2015-03-04 NOTE — Op Note (Addendum)
[facility]  [address] [place], [address]   COLONOSCOPY PROCEDURE REPORT  PATIENT: Neil Carroll, Neil Carroll  MR#: [id number] BIRTHDATE: [birth year] , 74  yrs. old GENDER: male ENDOSCOPIST: Inda Castle, MD REFERRED [id number] Linna Darner, M.D. PROCEDURE DATE:  03/04/2015 PROCEDURE:   Colonoscopy, surveillance First Screening Colonoscopy - Avg.  risk and is 50 yrs.  old or older - No.  Prior Negative Screening - Now for repeat screening. N/A  History of Adenoma - Now for follow-up colonoscopy & has been > or = to 3 yrs.  Yes hx of adenoma.  Has been 3 or more years since last colonoscopy.  Recommend repeat exam, <10 yrs? No ASA CLASS:   Class II INDICATIONS:PH Colon Adenoma. 2011 MEDICATIONS: Monitored anesthesia care and Propofol 230 mg IV  DESCRIPTION OF PROCEDURE:   After the risks benefits and alternatives of the procedure were thoroughly explained, informed consent was obtained.  The digital rectal exam revealed no abnormalities of the rectum.   The LB Olympus Loaner [id number] endoscope was introduced through the anus and advanced to the ileum. No adverse events experienced.   The quality of the prep was (Suprep was used) adequate  The instrument was then slowly withdrawn as the colon was fully examined. Estimated blood loss is zero unless otherwise noted in this procedure report.      COLON FINDINGS: A normal appearing cecum, ileocecal valve, and appendiceal orifice were identified.  The ascending, transverse, descending, sigmoid colon, and rectum appeared unremarkable. Retroflexed views revealed no abnormalities. The time to cecum = 2.6 Withdrawal time = 9.1   The scope was withdrawn and the procedure completed. COMPLICATIONS: There were no immediate complications.  ENDOSCOPIC IMPRESSION: Normal colonoscopy  RECOMMENDATIONS: Repeat Colonscopy in 7 years.  eSigned:  Inda Castle, MD 03/04/2015 3:36 PM Revised: 03/04/2015 3:36  PM  cc:

## 2015-03-05 ENCOUNTER — Telehealth: Payer: Self-pay | Admitting: *Deleted

## 2015-03-05 NOTE — Telephone Encounter (Signed)
  Follow up Call-  Call back number 03/04/2015  Post procedure Call Back phone  # [phone] hm  Permission to leave phone message Yes     Patient questions:  Do you have a fever, pain , or abdominal swelling? No. Pain Score  0 *  Have you tolerated food without any problems? Yes.    Have you been able to return to your normal activities? Yes.    Do you have any questions about your discharge instructions: Diet   No. Medications  No. Follow up visit  No.  Do you have questions or concerns about your Care? No.  Actions: * If pain score is 4 or above: No action needed, pain <4.

## 2015-03-20 ENCOUNTER — Encounter: Payer: Self-pay | Admitting: Gastroenterology

## 2015-03-28 DIAGNOSIS — L409 Psoriasis, unspecified: Secondary | ICD-10-CM | POA: Diagnosis not present

## 2015-04-22 ENCOUNTER — Other Ambulatory Visit: Payer: Self-pay | Admitting: *Deleted

## 2015-04-22 MED ORDER — LIRAGLUTIDE 18 MG/3ML ~~LOC~~ SOPN: 1.8000 mg | PEN_INJECTOR | Freq: Every day | SUBCUTANEOUS | Status: DC

## 2015-04-25 ENCOUNTER — Other Ambulatory Visit: Payer: Self-pay | Admitting: Emergency Medicine

## 2015-04-25 DIAGNOSIS — J449 Chronic obstructive pulmonary disease, unspecified: Secondary | ICD-10-CM

## 2015-04-25 MED ORDER — FLUTICASONE-SALMETEROL 100-50 MCG/DOSE IN AEPB: INHALATION_SPRAY | RESPIRATORY_TRACT | Status: DC

## 2015-05-03 ENCOUNTER — Ambulatory Visit: Payer: Federal, State, Local not specified - PPO | Admitting: Endocrinology

## 2015-05-17 ENCOUNTER — Other Ambulatory Visit: Payer: Self-pay | Admitting: *Deleted

## 2015-05-17 ENCOUNTER — Other Ambulatory Visit: Payer: Self-pay | Admitting: Cardiovascular Disease

## 2015-05-17 MED ORDER — INSULIN PEN NEEDLE 32G X 6 MM MISC: Status: DC

## 2015-05-21 ENCOUNTER — Other Ambulatory Visit (INDEPENDENT_AMBULATORY_CARE_PROVIDER_SITE_OTHER): Payer: Medicare Other

## 2015-05-21 DIAGNOSIS — E1165 Type 2 diabetes mellitus with hyperglycemia: Secondary | ICD-10-CM

## 2015-05-21 DIAGNOSIS — IMO0002 Reserved for concepts with insufficient information to code with codable children: Secondary | ICD-10-CM

## 2015-05-21 LAB — BASIC METABOLIC PANEL
BUN: 13 mg/dL (ref 6–23)
CHLORIDE: 104 meq/L (ref 96–112)
CO2: 30 meq/L (ref 19–32)
Calcium: 9.2 mg/dL (ref 8.4–10.5)
Creatinine, Ser: 1.15 mg/dL (ref 0.40–1.50)
GFR: 82.06 mL/min (ref >60.00)
Glucose, Bld: 120 mg/dL — ABNORMAL HIGH (ref 70–99)
POTASSIUM: 4 meq/L (ref 3.5–5.1)
SODIUM: 141 meq/L (ref 135–145)

## 2015-05-21 LAB — HEMOGLOBIN A1C: Hgb A1c MFr Bld: 6.7 % — ABNORMAL HIGH (ref 4.6–6.5)

## 2015-05-23 ENCOUNTER — Ambulatory Visit (INDEPENDENT_AMBULATORY_CARE_PROVIDER_SITE_OTHER): Payer: Medicare Other | Admitting: Endocrinology

## 2015-05-23 ENCOUNTER — Encounter: Payer: Self-pay | Admitting: Endocrinology

## 2015-05-23 ENCOUNTER — Other Ambulatory Visit: Payer: Self-pay | Admitting: Internal Medicine

## 2015-05-23 ENCOUNTER — Other Ambulatory Visit: Payer: Self-pay | Admitting: Cardiovascular Disease

## 2015-05-23 VITALS — BP 132/76 | HR 84 | Temp 98.3°F | Resp 16 | Ht 71.0 in | Wt 269.0 lb

## 2015-05-23 DIAGNOSIS — R600 Localized edema: Secondary | ICD-10-CM | POA: Diagnosis not present

## 2015-05-23 DIAGNOSIS — E119 Type 2 diabetes mellitus without complications: Secondary | ICD-10-CM | POA: Diagnosis not present

## 2015-05-23 DIAGNOSIS — E1169 Type 2 diabetes mellitus with other specified complication: Secondary | ICD-10-CM

## 2015-05-23 DIAGNOSIS — E669 Obesity, unspecified: Secondary | ICD-10-CM

## 2015-05-23 NOTE — Telephone Encounter (Signed)
Please advise 

## 2015-05-23 NOTE — Telephone Encounter (Signed)
OK X3 mos  My retirement date is 09/21/2015; but I will be in office on a limited schedule Oct-Dec. To guarantee continuity of care you should transition your care to another PCP by Oct 1,2016.

## 2015-05-23 NOTE — Progress Notes (Signed)
Patient ID: Neil Carroll, male   DOB: [birth year], 64 y.o.   MRN: [id number]   Reason for Appointment : Followup for Type 2 Diabetes  History of Present Illness          Diagnosis: Type 2 diabetes mellitus, date of diagnosis:  2008     Past history: He has been treated with metformin only for several years and had relatively good control until about a year ago. However had been taking only 1000 mg of metformin. Because of his progressively higher blood sugars he had been started on Levemir insulin since 6/14.  However with this his A1c was still 10.2 in 9/14 and he was referred here With increasing Victoza to 1.8 mg in 3/15 his weight had come down and his blood sugars had improved   Recent history:  INSULIN regimen is described as:  Levemir 24 units at 5 pm  Although his A1c had been consistent in 2015 it was  relatively higher at 7.3 in 2/16 Since then however his A1c has gradually improved, probably from better diet. He thinks he is eating out less and eliminating more regular drinks He has not done any exercise and has not lost any weight   Fasting blood sugars: They are slightly high, 120 in the lab and 132 at home but he has checked only one reading   He is still taking Victoza 1.8 mg without side effects.   He takes in the late afternoon with his LEVEMIR.  He thinks he remembers to take the injections better in the afternoon    Oral hypoglycemic drugs the patient is taking are: Metformin 2g Side effects from medications have been: None Compliance with the medical regimen: Fair   Glucose monitoring: Irregular        Glucometer: One Touch.      Blood Glucose readings from  Download: Has only 8 readings in the last month      PRE-MEAL Breakfast  1 PM   3-6 PM  9 PM Overall  Glucose range:  132  84, 111     92-110   87    median:       103    Hypoglycemia frequency: Never.           Self-care:   Meals: 2/day. 11 am 6 pm.  He is eating out less regularly     Physical activity: exercise: not  walking         Dietician visit: Most recent: 09/2013                Retinal exam: Most recent: 2 years ago.   Wt Readings from Last 3 Encounters:  05/23/15 269 lb (122.018 kg)  03/04/15 269 lb (122.018 kg)  01/31/15 269 lb (122.018 kg)       Lab Results  Component Value Date   HGBA1C 6.7* 05/21/2015   HGBA1C 7.0* 01/31/2015   HGBA1C 7.3* 10/30/2014   Lab Results  Component Value Date   MICROALBUR 4.1* 01/31/2015   LDLCALC 43 09/24/2014   CREATININE 1.15 05/21/2015   Appointment on 05/21/2015  Component Date Value Ref Range Status  . Hgb A1c MFr Bld 05/21/2015 6.7* 4.6 - 6.5 % Final   Glycemic Control Guidelines for People with Diabetes:Non Diabetic:  <6%Goal of Therapy: <7%Additional Action Suggested:  >8%   . Sodium 05/21/2015 141  135 - 145 mEq/L Final  . Potassium 05/21/2015 4.0  3.5 - 5.1 mEq/L Final  . Chloride 05/21/2015 104  96 - 112 mEq/L Final  . CO2 05/21/2015 30  19 - 32 mEq/L Final  . Glucose, Bld 05/21/2015 120* 70 - 99 mg/dL Final  . BUN 05/21/2015 13  6 - 23 mg/dL Final  . Creatinine, Ser 05/21/2015 1.15  0.40 - 1.50 mg/dL Final  . Calcium 05/21/2015 9.2  8.4 - 10.5 mg/dL Final  . GFR 05/21/2015 82.06  >60.00 mL/min Final       Medication List       This list is accurate as of: 05/23/15  4:08 PM.  Always use your most recent med list.               ammonium lactate 12 % cream  Commonly known as:  AMLACTIN  Apply topically as needed.     aspirin 325 MG tablet  Take 325 mg by mouth daily.     cilostazol 100 MG tablet  Commonly known as:  PLETAL  take 1 tablet by mouth twice a day     clobetasol 0.05 % topical foam  Commonly known as:  OLUX     CRESTOR 20 MG tablet  Generic drug:  rosuvastatin  take 1/2  tablet by mouth once daily     etanercept 50 MG/ML injection  Commonly known as:  ENBREL  Inject 50 mg into the skin. Twice a week     Fish Oil 1000 MG Caps  Take 2 capsules by mouth daily.      Flax Seed Oil 1000 MG Caps  Take 1 capsule by mouth 2 (two) times daily.     Fluticasone-Salmeterol 100-50 MCG/DOSE Aepb  Commonly known as:  ADVAIR DISKUS  inhale 1 dose by mouth twice a day     glucose blood test strip  Commonly known as:  ONE TOUCH ULTRA TEST  Use to test blood sugar daily ICDE11 69     halobetasol 0.05 % cream  Commonly known as:  ULTRAVATE  Apply topically 2 (two) times daily.     hydrochlorothiazide 25 MG tablet  Commonly known as:  HYDRODIURIL  take 1 tablet by mouth once daily     Insulin Pen Needle 32G X 6 MM Misc  Commonly known as:  NOVOFINE  Use 2 needles daily with Levimir pen and Victoza pen  DX: E11.69     ketoconazole 2 % cream  Commonly known as:  NIZORAL  Apply topically 2 (two) times daily.     LEVEMIR FLEXTOUCH 100 UNIT/ML Pen  Generic drug:  Insulin Detemir  INJECT 26 UNITS INTO THE   SKIN DAILY     Liraglutide 18 MG/3ML Sopn  Inject 0.3 mLs (1.8 mg total) into the skin daily.     losartan 100 MG tablet  Commonly known as:  COZAAR  take 1 tablet by mouth once daily     metformin 1000 MG (OSM) 24 hr tablet  Commonly known as:  FORTAMET  TAKE 1 TABLET TWICE A DAY     metoprolol 100 MG tablet  Commonly known as:  LOPRESSOR  take 1 tablet by mouth twice a day     mupirocin ointment 2 %  Commonly known as:  BACTROBAN  Apply 1 application topically 2 (two) times daily.     ONETOUCH DELICA LANCETS FINE Misc  ICD 10 E11 69 TEST BLOOD SUGAR DAILY     tamsulosin 0.4 MG Caps capsule  Commonly known as:  FLOMAX  Take 0.4 mg by mouth.     triamcinolone cream 0.1 %  Commonly known  as:  KENALOG  Apply topically 2 (two) times daily.     VECTICAL 3 MCG/GM cream  Generic drug:  Calcitriol  Apply topically at bedtime.     VITAMIN B6 PO  Take 25 mg by mouth daily.        Allergies:  Allergies  Allergen Reactions  . Niacin Other (See Comments)    REACTION: upset stomach    Past Medical History  Diagnosis Date  . Other  peripheral vascular disease(443.89)     bilateral lower extremity  . Hyperlipidemia   . Hypertension   . Transient global amnesia   . Contact dermatitis and other eczema due to plants (except food)   . Tobacco use disorder     dependent  . Atrial flutter     onset  2011. s/p EPS/RFA 12/2011  . Type II diabetes mellitus   . Chronic bronchitis   . GERD (gastroesophageal reflux disease)   . LV dysfunction     EF 45-50% 12/2011  . Adenomatous colon polyp   . COPD (chronic obstructive pulmonary disease)   . Arthritis     left hip replacement  . Cataract   . Tuberculosis     Past Surgical History  Procedure Laterality Date  . Partial hip arthroplasty Right 01/2000    "Right; replaced ball & stem"  . Partial hip arthroplasty Left   . Cystoscopy  11/2007    Dr Jeffie Pollock  . Cardiac electrophysiology mapping and ablation  03/2010  . Tee without cardioversion  12/28/2011    Procedure: TRANSESOPHAGEAL ECHOCARDIOGRAM (TEE);  Surgeon: Larey Dresser, MD;  Location: [facility];  Service: Cardiovascular;  Laterality: N/A;  . Colonoscopy with polypectomy  2006 & 2011    Dr [name]  . A flutter ablation N/A 12/28/2011    Procedure: ABLATION A FLUTTER;  Surgeon: Evans Lance, MD;  Location: [facility] CATH LAB;  Service: Cardiovascular;  Laterality: N/A;  . Colonoscopy    . Polypectomy      Family History  Problem Relation Age of Onset  . Stroke Mother 44  . Hypertension Mother   . Diabetes Mother   . Lung cancer Father     smoker  . Diabetes Paternal Grandmother   . Diabetes Brother     MI @ 77  . Diabetes Brother     Hepatitis C  . Throat cancer Paternal Uncle     1/2 uncle  . Heart attack Brother 37  . Colon cancer Neg Hx   . Esophageal cancer Neg Hx   . Rectal cancer Neg Hx   . Stomach cancer Neg Hx   . Crohn's disease Son     Social History:  reports that he has been smoking Cigarettes.  He has a 44 pack-year smoking history. He has never used smokeless tobacco. He reports that he  drinks alcohol. He reports that he does not use illicit drugs.    Review of Systems       Lipids: He has persistently low HDL, LDL well controlled with, taking Crestor  Lab Results  Component Value Date   CHOL 91 09/24/2014   HDL 28.20* 09/24/2014   LDLCALC 43 09/24/2014   TRIG 97.0 09/24/2014   CHOLHDL 3 09/24/2014              Has occasional  history of Numbness, tingling in the right first or second toes, no burning in feet, on B6 vitamins OTC Foot exam done in 10/15 Diabetic foot exam showed normal monofilament sensation  in the toes and plantar surfaces, no skin lesions or ulcers on the feet and absent pedal pulses    Physical Examination:  BP 132/76 mmHg  Pulse 84  Temp(Src) 98.3 F (36.8 C)  Resp 16  Ht '5\' 11"'$  (1.803 m)  Wt 269 lb (122.018 kg)  BMI 37.53 kg/m2  SpO2 96%    He has 2+ ankle edema present Pedal pulses not palpable but sensation is normal, diabetic foot exam done  ASSESSMENT:  Diabetes type 2, uncontrolled     Currently on maximum dose of Victoza and metformin along with basal insulin  His control is somewhat better as A1c is now below 7% again This is despite his not being motivated to exercise despite frequent reminders His weight is still about the same although he does appear to have more edema now  His blood sugars are fairly good at home and not higher after meals; however has not been checking them after his main meal in the evening Fasting blood sugars tend to be minimally increased consistently  PLAN:   Better compliance with  exercise, he can use his treadmill at home Start monitoring more readings after evening meal Levemir  unchanged at 24 units daily Follow-up in 4 months Discuss edema with his cardiologist or PCP  Patient Instructions  Check blood sugars on waking up .[name] 2-3 .[name] times a week Also check blood sugars about 2 hours after a meal and do this after different meals by rotation  Recommended blood sugar levels on  waking up is 90-130 and about 2 hours after meal is 140-180 Please bring blood sugar monitor to each visit.  Get on treadmill daily       Cecilia Nishikawa 05/23/2015, 4:08 PM   Note: This office note was prepared with Dragon voice recognition system technology. Any transcriptional errors that result from this process are unintentional.

## 2015-05-23 NOTE — Patient Instructions (Addendum)
Check blood sugars on waking up .[name] 2-3 .[name] times a week Also check blood sugars about 2 hours after a meal and do this after different meals by rotation  Recommended blood sugar levels on waking up is 90-130 and about 2 hours after meal is 140-180 Please bring blood sugar monitor to each visit.  Get on treadmill daily

## 2015-05-24 ENCOUNTER — Other Ambulatory Visit: Payer: Self-pay | Admitting: Emergency Medicine

## 2015-05-24 MED ORDER — CILOSTAZOL 100 MG PO TABS: 100.0000 mg | ORAL_TABLET | Freq: Two times a day (BID) | ORAL | Status: DC

## 2015-05-29 ENCOUNTER — Ambulatory Visit (INDEPENDENT_AMBULATORY_CARE_PROVIDER_SITE_OTHER): Payer: Medicare Other | Admitting: Internal Medicine

## 2015-05-29 ENCOUNTER — Encounter: Payer: Self-pay | Admitting: Internal Medicine

## 2015-05-29 ENCOUNTER — Other Ambulatory Visit (INDEPENDENT_AMBULATORY_CARE_PROVIDER_SITE_OTHER): Payer: Medicare Other

## 2015-05-29 VITALS — BP 130/76 | HR 78 | Temp 98.4°F | Resp 18 | Ht 71.0 in | Wt 269.0 lb

## 2015-05-29 DIAGNOSIS — E1151 Type 2 diabetes mellitus with diabetic peripheral angiopathy without gangrene: Secondary | ICD-10-CM | POA: Diagnosis not present

## 2015-05-29 DIAGNOSIS — E1159 Type 2 diabetes mellitus with other circulatory complications: Secondary | ICD-10-CM

## 2015-05-29 DIAGNOSIS — Z720 Tobacco use: Secondary | ICD-10-CM | POA: Diagnosis not present

## 2015-05-29 DIAGNOSIS — E785 Hyperlipidemia, unspecified: Secondary | ICD-10-CM

## 2015-05-29 DIAGNOSIS — F17200 Nicotine dependence, unspecified, uncomplicated: Secondary | ICD-10-CM

## 2015-05-29 DIAGNOSIS — Z23 Encounter for immunization: Secondary | ICD-10-CM | POA: Diagnosis not present

## 2015-05-29 DIAGNOSIS — I10 Essential (primary) hypertension: Secondary | ICD-10-CM | POA: Diagnosis not present

## 2015-05-29 DIAGNOSIS — L409 Psoriasis, unspecified: Secondary | ICD-10-CM

## 2015-05-29 LAB — LIPID PANEL
CHOLESTEROL: 110 mg/dL (ref 0–200)
HDL: 38.2 mg/dL — ABNORMAL LOW (ref >39.00)
LDL Cholesterol: 53 mg/dL (ref 0–99)
NONHDL: 72.07
Total CHOL/HDL Ratio: 3
Triglycerides: 96 mg/dL (ref 0.0–149.0)
VLDL: 19.2 mg/dL (ref 0.0–40.0)

## 2015-05-29 LAB — TSH: TSH: 1.31 u[IU]/mL (ref 0.35–4.50)

## 2015-05-29 NOTE — Progress Notes (Signed)
   Subjective:    Patient ID: Neil Carroll, male    DOB: 1951/09/13, 64 y.o.   MRN: [id number]  HPI The patient is here to assess status of active health conditions.  PMH, FH, & Social History reviewed & updated.No change in [place] as recorded.  He is not on a heart healthy diet; he eats red meat, fried foods, and salt. He has no exercise program except for yardwork. He has been compliant with his medicines without adverse effects. He does not check blood pressures.  His diabetes is followed by Dr. Dwyane Dee. A1c was 6.7% on 8/30; chemistries and renal function were normal at that time.[name] His fasting blood sugar was 128 this morning. He denies any hypoglycemia. Ophthalmologic exam is due 9/19. He sees his Podiatrist on a regular basis.  Lipids were performed 09/24/14 and were at [place] fact they were extremely low.LFTs WNL 01/31/15.  He smokes a pack per day. He rarely drinks alcohol.  His colonoscopy is up-to-date;in fact this was done this year. He denies any active GI symptoms.  He is on immunosuppressive for his Psorisis from Dr. Allyson Sabal, Dermatologist.   Exertional dyspnea is chronic.He is on Pletal for claudication.Edema is also chronic.  He also has nocturia 3 nightly.    Review of Systems Chest pain, palpitations, tachycardia, or paroxysmal nocturnal dyspnea  are absent. No unexplained weight loss, abdominal pain, significant dyspepsia, dysphagia, melena, rectal bleeding, or persistently small caliber stools. Dysuria, pyuria, hematuria, frequency, or polyuria are denied. Change in hair, skin, nails denied. No bowel changes of constipation or diarrhea. No intolerance to heat or cold.    Objective:   Physical Exam Pertinent or positive findings include: Pattern alopecia and small mustache present. He is markedly hoarse. Teeth are tobacco stained. The nasal septum is deviated to the right. He has an intermittent grade 1/2 systolic murmur. He has scattered, homogenous juicy  rhonchi in all lung fields. He also has loose, nonproductive cough. There are very faint scattered irregular hyperpigmentation changes over the extremities and thorax. No pustules or vesicles present. He has 1/2+ edema.  General appearance :adequately nourished; in no distress. Strong tobacco odor noted in the room.  Eyes: No conjunctival inflammation or scleral icterus is present.  Oral exam:  Lips and gums are healthy appearing.There is  oropharyngeal erythema but no exudate noted.   Heart:  Normal rate and regular rhythm. S1 and S2 normal without gallop, click, rub or other extra sounds      Abdomen: bowel sounds normal, soft and non-tender without masses, organomegaly or hernias noted.  No guarding or rebound.   Vascular : all pulses equal ; no bruits present.  Skin:Warm & dry.  Intact without suspicious lesions or rashes ; no tenting   Lymphatic: No lymphadenopathy is noted about the head, neck, axilla  Neuro: Strength, tone & DTRs normal.     Assessment & Plan:  See Current Assessment & Plan in Problem List under specific Diagnosis

## 2015-05-29 NOTE — Assessment & Plan Note (Addendum)
Risk and options discussed

## 2015-05-29 NOTE — Progress Notes (Signed)
Pre visit review using our clinic review tool, if applicable. No additional management support is needed unless otherwise documented below in the visit note. 

## 2015-05-29 NOTE — Patient Instructions (Addendum)
  Your next office appointment will be determined based upon review of your pending labs  and  xrays  Those written interpretation of the lab results and instructions will be transmitted to you by mail for your records.  Critical results will be called.   Followup as needed for any active or acute issue. Please report any significant change in your symptoms.  Minimal Blood Pressure Goal= AVERAGE < 140/90;  Ideal is an AVERAGE < 135/85. This AVERAGE should be calculated from @ least 5-7 BP readings taken @ different times of day on different days of week. You should not respond to isolated BP readings , but rather the AVERAGE for that week .Please bring your  blood pressure cuff to office visits to verify that it is reliable.It  can also be checked against the blood pressure device at the pharmacy. Finger or wrist cuffs are not dependable; an arm cuff is.  Please think about quitting smoking. Review the risks we discussed. Please call 1-800-QUIT-NOW 986-224-5511) for free smoking cessation counseling. There are multiple options are to help you stop smoking. These include nicotine patches, nicotine gum, and the new "E cigarette".

## 2015-05-29 NOTE — Assessment & Plan Note (Signed)
Blood pressure goals reviewed. BMET reviewed 

## 2015-05-29 NOTE — Assessment & Plan Note (Addendum)
Last lipids low in 09/2014 Lipids;TSH

## 2015-05-29 NOTE — Assessment & Plan Note (Signed)
A1c is 6.7   Ophthalmologic and Podiatry monitor are current

## 2015-06-05 DIAGNOSIS — E1151 Type 2 diabetes mellitus with diabetic peripheral angiopathy without gangrene: Secondary | ICD-10-CM | POA: Diagnosis not present

## 2015-06-05 DIAGNOSIS — S91302A Unspecified open wound, left foot, initial encounter: Secondary | ICD-10-CM | POA: Diagnosis not present

## 2015-06-05 DIAGNOSIS — L602 Onychogryphosis: Secondary | ICD-10-CM | POA: Diagnosis not present

## 2015-06-05 DIAGNOSIS — M2042 Other hammer toe(s) (acquired), left foot: Secondary | ICD-10-CM | POA: Diagnosis not present

## 2015-06-05 DIAGNOSIS — L97829 Non-pressure chronic ulcer of other part of left lower leg with unspecified severity: Secondary | ICD-10-CM | POA: Diagnosis not present

## 2015-06-10 DIAGNOSIS — E119 Type 2 diabetes mellitus without complications: Secondary | ICD-10-CM | POA: Diagnosis not present

## 2015-06-10 LAB — HM DIABETES EYE EXAM

## 2015-06-14 ENCOUNTER — Encounter: Payer: Self-pay | Admitting: Internal Medicine

## 2015-06-17 DIAGNOSIS — L97829 Non-pressure chronic ulcer of other part of left lower leg with unspecified severity: Secondary | ICD-10-CM | POA: Diagnosis not present

## 2015-06-17 DIAGNOSIS — E1151 Type 2 diabetes mellitus with diabetic peripheral angiopathy without gangrene: Secondary | ICD-10-CM | POA: Diagnosis not present

## 2015-06-17 DIAGNOSIS — S91302A Unspecified open wound, left foot, initial encounter: Secondary | ICD-10-CM | POA: Diagnosis not present

## 2015-06-17 DIAGNOSIS — L602 Onychogryphosis: Secondary | ICD-10-CM | POA: Diagnosis not present

## 2015-06-26 ENCOUNTER — Other Ambulatory Visit: Payer: Self-pay | Admitting: *Deleted

## 2015-06-26 MED ORDER — INSULIN DETEMIR 100 UNIT/ML FLEXPEN
PEN_INJECTOR | SUBCUTANEOUS | Status: DC
Start: 2015-06-26 — End: 2015-07-24

## 2015-06-28 ENCOUNTER — Telehealth: Payer: Self-pay | Admitting: Endocrinology

## 2015-06-28 NOTE — Telephone Encounter (Signed)
[facility] calling about levemir pen pt had this filled yesterday at [facility].

## 2015-07-01 DIAGNOSIS — L97821 Non-pressure chronic ulcer of other part of left lower leg limited to breakdown of skin: Secondary | ICD-10-CM | POA: Diagnosis not present

## 2015-07-01 DIAGNOSIS — E1151 Type 2 diabetes mellitus with diabetic peripheral angiopathy without gangrene: Secondary | ICD-10-CM | POA: Diagnosis not present

## 2015-07-04 ENCOUNTER — Encounter: Payer: Self-pay | Admitting: Cardiovascular Disease

## 2015-07-04 ENCOUNTER — Ambulatory Visit (INDEPENDENT_AMBULATORY_CARE_PROVIDER_SITE_OTHER): Payer: Medicare Other | Admitting: Cardiovascular Disease

## 2015-07-04 VITALS — BP 150/70 | HR 81 | Ht 71.0 in | Wt 269.0 lb

## 2015-07-04 DIAGNOSIS — I208 Other forms of angina pectoris: Secondary | ICD-10-CM | POA: Diagnosis not present

## 2015-07-04 DIAGNOSIS — R079 Chest pain, unspecified: Secondary | ICD-10-CM

## 2015-07-04 LAB — BASIC METABOLIC PANEL
BUN: 11 mg/dL (ref 7–25)
CHLORIDE: 104 mmol/L (ref 98–110)
CO2: 27 mmol/L (ref 20–31)
Calcium: 9.3 mg/dL (ref 8.6–10.3)
Creat: 1.17 mg/dL (ref 0.70–1.25)
GLUCOSE: 110 mg/dL — AB (ref 65–99)
POTASSIUM: 4.3 mmol/L (ref 3.5–5.3)
SODIUM: 139 mmol/L (ref 135–146)

## 2015-07-04 LAB — CBC
HEMATOCRIT: 49.8 % (ref 39.0–52.0)
HEMOGLOBIN: 17.1 g/dL — AB (ref 13.0–17.0)
MCH: 31 pg (ref 26.0–34.0)
MCHC: 34.3 g/dL (ref 30.0–36.0)
MCV: 90.4 fL (ref 78.0–100.0)
MPV: 8.9 fL (ref 8.6–12.4)
PLATELETS: 140 10*3/uL — AB (ref 150–400)
RBC: 5.51 MIL/uL (ref 4.22–5.81)
RDW: 14.4 % (ref 11.5–15.5)
WBC: 7.2 10*3/uL (ref 4.0–10.5)

## 2015-07-04 LAB — PROTIME-INR
INR: 0.9 (ref <1.50)
PROTHROMBIN TIME: 12.2 s (ref 11.6–15.2)

## 2015-07-04 NOTE — Progress Notes (Signed)
.   Cardiology Office Note Date:  07/04/2015   ID:  Neil Carroll, DOB September 21, 1951, MRN [id number]  PCP:  Unice Cobble, MD  Cardiologist:  Sherren Mocha, MD    Chief Complaint  Patient presents with  . Chest Pain   History of Present Illness: Neil Carroll is a 64 y.o. male who presents for followup evaluation. The patient has a history of atrial flutter status post redo frequency ablation, peripheral arterial disease, presumed CAD based on an abnormal stress Myoview, and ongoing tobacco abuse. A nuclear scan in 2013 was abnormal but low risk and medical therapy was recommended. He has known SFA occlusive disease bilaterally.   He reports occasional episodes of resting chest pressure, 'uneasiness,' last occurring one week ago. Symptoms last 15-30 minutes.  Has also had episodes of shortness of breath with activity, using a trimmer in his yard.  Admits to orthopnea and PND, leg swelling. Chest pressure occurs with exertion as well. Describes symptoms as non-radiating. No clear exacerbating or alleviating factors. He has never taken NTG.   Past Medical History  Diagnosis Date  . Other peripheral vascular disease(443.89)     bilateral lower extremity  . Hyperlipidemia   . Hypertension   . Transient global amnesia   . Contact dermatitis and other eczema due to plants (except food)   . Tobacco use disorder     dependent  . Atrial flutter ([place])     onset  2011. s/p EPS/RFA 12/2011  . Type II diabetes mellitus ([place])   . Chronic bronchitis   . GERD (gastroesophageal reflux disease)   . LV dysfunction     EF 45-50% 12/2011  . Adenomatous colon polyp   . COPD (chronic obstructive pulmonary disease) ([place])   . Arthritis     left hip replacement  . Cataract   . Tuberculosis     Past Surgical History  Procedure Laterality Date  . Partial hip arthroplasty Right 01/2000    "Right; replaced ball & stem"  . Partial hip arthroplasty Left   . Cystoscopy  11/2007    Dr Jeffie Pollock   . Cardiac electrophysiology mapping and ablation  03/2010  . Tee without cardioversion  12/28/2011    Procedure: TRANSESOPHAGEAL ECHOCARDIOGRAM (TEE);  Surgeon: Larey Dresser, MD;  Location: [facility];  Service: Cardiovascular;  Laterality: N/A;  . Colonoscopy with polypectomy  2006 & 2011    Dr [name]  . A flutter ablation N/A 12/28/2011    Procedure: ABLATION A FLUTTER;  Surgeon: Evans Lance, MD;  Location: [facility] CATH LAB;  Service: Cardiovascular;  Laterality: N/A;  . Colonoscopy    . Polypectomy      Current Outpatient Prescriptions  Medication Sig Dispense Refill  . ammonium lactate (AMLACTIN) 12 % cream Apply topically as needed for dry skin.    [name] aspirin 325 MG tablet Take 325 mg by mouth daily.    . Calcitriol (VECTICAL) 3 MCG/GM cream Apply topically at bedtime.     . cilostazol (PLETAL) 100 MG tablet Take 1 tablet (100 mg total) by mouth 2 (two) times daily. 60 tablet 2  . clobetasol (OLUX) 0.05 % topical foam Apply topically 2 (two) times daily.   0  . etanercept (ENBREL) 50 MG/ML injection Inject 50 mg into the skin. Twice a week    . Flaxseed, Linseed, (FLAX SEED OIL) 1000 MG CAPS Take 1 capsule by mouth 2 (two) times daily.     . Fluticasone-Salmeterol (ADVAIR DISKUS) 100-50 MCG/DOSE AEPB inhale 1  dose by mouth twice a day 180 each 2  . glucose blood (ONE TOUCH ULTRA TEST) test strip Use to test blood sugar daily ICDE11 69 100 each 3  . halobetasol (ULTRAVATE) 0.05 % cream Apply topically 2 (two) times daily.    . hydrochlorothiazide (HYDRODIURIL) 25 MG tablet take 1 tablet by mouth once daily 90 tablet 0  . Insulin Detemir (LEVEMIR FLEXTOUCH) 100 UNIT/ML Pen INJECT 24 UNITS INTO THE   SKIN DAILY 30 mL 1  . Insulin Pen Needle (NOVOFINE) 32G X 6 MM MISC Use 2 needles daily with Levimir pen and Victoza pen  DX: E11.69 100 each 5  . ketoconazole (NIZORAL) 2 % cream Apply topically 2 (two) times daily.      . Liraglutide 18 MG/3ML SOPN Inject 0.3 mLs (1.8 mg total) into the  skin daily. 9 pen 1  . losartan (COZAAR) 100 MG tablet take 1 tablet by mouth once daily 30 tablet 1  . metformin (FORTAMET) 1000 MG (OSM) 24 hr tablet TAKE 1 TABLET TWICE A DAY 180 tablet 1  . metoprolol (LOPRESSOR) 100 MG tablet take 1 tablet by mouth twice a day 180 tablet 0  . mupirocin ointment (BACTROBAN) 2 % Apply 1 application topically 2 (two) times daily.    . Omega-3 Fatty Acids (FISH OIL) 1000 MG CAPS Take 2 capsules by mouth 2 (two) times daily.     Glory Rosebush DELICA LANCETS FINE MISC ICD 10 E11 69 TEST BLOOD SUGAR DAILY 200 each 12  . rosuvastatin (CRESTOR) 20 MG tablet take 1/2  tablet by mouth once daily 90 tablet 3  . tamsulosin (FLOMAX) 0.4 MG CAPS capsule Take 0.4 mg by mouth daily.     [name] triamcinolone cream (KENALOG) 0.1 % Apply topically 2 (two) times daily.     No current facility-administered medications for this visit.    Allergies:   Niacin   Social History:  The patient  reports that he has been smoking Cigarettes.  He has a 44 pack-year smoking history. He has never used smokeless tobacco. He reports that he drinks alcohol. He reports that he does not use illicit drugs.   Family History:  The patient's  family history includes Crohn's disease in his son; Diabetes in his brother, brother, mother, and paternal grandmother; Heart attack (age of onset: 33) in his brother; Hypertension in his mother; Lung cancer in his father; Stroke (age of onset: 13) in his mother; Throat cancer in his paternal uncle. There is no history of Colon cancer, Esophageal cancer, Rectal cancer, or Stomach cancer.    ROS:  Please see the history of present illness.  Otherwise, review of systems is positive for leg swelling, waking up at night short of breath, cough, irregular heart beats, wheezing, and headaches.  All other systems are reviewed and negative.    PHYSICAL EXAM: VS:  BP 150/70 mmHg  Pulse 81  Ht '5\' 11"'$  (1.803 m)  Wt 269 lb (122.018 kg)  BMI 37.53 kg/m2 , BMI Body mass index  is 37.53 kg/(m^2). GEN: Well nourished, well developed, overweight male in no acute distress HEENT: normal Neck: no JVD, no masses. No carotid bruits Cardiac: RRR without murmur or gallop                Respiratory:  clear to auscultation bilaterally, normal work of breathing GI: soft, nontender, nondistended, + BS MS: no deformity or atrophy Ext: Trace pretibial edema bilaterally Skin: warm and dry, there is an ulceration on the left  lower leg, mid-tibial region Neuro:  Strength and sensation are intact Psych: euthymic mood, full affect  EKG:  EKG is ordered today. The ekg ordered today shows NSR with premature supraventricular contractions, nonspecific T wave changes  Recent Labs: 01/29/2015: Hemoglobin 16.4; Platelets 168.0 01/31/2015: ALT 34 05/21/2015: BUN 13; Creatinine, Ser 1.15; Potassium 4.0; Sodium 141 05/29/2015: TSH 1.31   Lipid Panel     Component Value Date/Time   CHOL 110 05/29/2015 1116   TRIG 96.0 05/29/2015 1116   HDL 38.20* 05/29/2015 1116   CHOLHDL 3 05/29/2015 1116   VLDL 19.2 05/29/2015 1116   LDLCALC 53 05/29/2015 1116      Wt Readings from Last 3 Encounters:  07/04/15 269 lb (122.018 kg)  05/29/15 269 lb (122.018 kg)  05/23/15 269 lb (122.018 kg)     Cardiac Studies Reviewed: 2D Echo 12/28/2011: Study Conclusions  - Left ventricle: Systolic function was low normal to mildly reduced. The estimated ejection fraction was in the range of 50% to 55%. - Aortic valve: There was no stenosis. Mild regurgitation. - Aorta: Normal caliber aorta with minimal plaque. - Mitral valve: Mild to moderate regurgitation. - Left atrium: The atrium was mildly dilated. No evidence of thrombus in the atrial cavity or appendage. - Right ventricle: The cavity size was normal. Systolic function was normal. - Right atrium: The atrium was mildly dilated. - Atrial septum: No defect or patent foramen ovale was identified. Echo contrast study showed no  right-to-left atrial level shunt, at baseline or with provocation. - Tricuspid valve: Peak RV-RA gradient: 70m Hg (S).  ASSESSMENT AND PLAN: 1.  Coronary artery disease, native vessel. Pt now having symptoms of angina at rest and with activity. He had an abnormal stress Myoview 3 years ago with medical therapy recommended. He has multiple ongoing risk factors for Coronary artery disease, including tobacco abuse, diabetes, and HTN. We discussed diagnostic options and favor definitive evaluation with cardiac catheterization and possible PCI considering his high-risk profile and worsening symptoms.   I have reviewed the risks, indications, and alternatives to cardiac catheterization and possible PCI with the patient. Risks include but are not limited to bleeding, infection, vascular injury, stroke, myocardial infection, arrhythmia, kidney injury, radiation-related injury in the case of prolonged fluoroscopy use, emergency cardiac surgery, and death. The patient understands the risks of serious complication is low (<<0%.   He will continue ASA, crestor, metoprolol, and losartan. He is not inclined to start another medication at this point, so I decided not to start a long-acting nitrate. Further plans pending cath results.  2. Peripheral arterial disease with intermittent claudication. Stable, non-lifestyle limiting calf claudication. He is undergoing treatment for a right leg ulceration. He continues on cilostazol.  3. Hypertension. Blood pressure is elevated today but stable on review of multiple office visits. He will continue on a combination of hydrochlorothiazide, losartan, and metoprolol.  4. Tobacco abuse. Cessation counseling done. He is well educated and understands the ramifications of continued tobacco. He will do his best to quit or cut back.  5. Hyperlipidemia. Lipids are at goal. These were reviewed today. He remains on Crestor 20 mg daily.  6. Type 2 diabetes. Managed by Dr. KDwyane Dee    7. Atrial flutter. Maintaining sinus rhythm after RF ablation   Current medicines are reviewed with the patient today.  The patient does not have concerns regarding medicines.  Labs/ tests ordered today include:  No orders of the defined types were placed in this encounter.   Disposition:  FU one year  Signed, Sherren Mocha, MD  07/04/2015 9:24 AM    [facility] [place], [place], [place]  [address] Phone: [phone]; Fax: [phone]

## 2015-07-04 NOTE — Patient Instructions (Signed)
Medication Instructions:  Your physician recommends that you continue on your current medications as directed. Please refer to the Current Medication list given to you today.  Labwork: Your physician recommends that you have lab work today: BMP, CBC and PT/INR  Testing/Procedures: Your physician has requested that you have a cardiac catheterization. Cardiac catheterization is used to diagnose and/or treat various heart conditions. Doctors may recommend this procedure for a number of different reasons. The most common reason is to evaluate chest pain. Chest pain can be a symptom of coronary artery disease (CAD), and cardiac catheterization can show whether plaque is narrowing or blocking your heart's arteries. This procedure is also used to evaluate the valves, as well as measure the blood flow and oxygen levels in different parts of your heart. For further information please visit [email or web address]. Please follow instruction sheet, as given.  Follow-Up: We will arrange further follow-up after cardiac catheterization.   Any Other Special Instructions Will Be Listed Below (If Applicable).

## 2015-07-09 ENCOUNTER — Other Ambulatory Visit: Payer: Self-pay | Admitting: Cardiovascular Disease

## 2015-07-09 DIAGNOSIS — I208 Other forms of angina pectoris: Secondary | ICD-10-CM

## 2015-07-09 DIAGNOSIS — I209 Angina pectoris, unspecified: Secondary | ICD-10-CM

## 2015-07-10 ENCOUNTER — Encounter (HOSPITAL_COMMUNITY)
Admission: RE | Disposition: A | Discharge status: Home / Self Care | Payer: Self-pay | Source: Ambulatory Visit | Attending: Cardiovascular Disease

## 2015-07-10 ENCOUNTER — Ambulatory Visit (HOSPITAL_COMMUNITY)
Admission: RE | Admit: 2015-07-10 | Discharge: 2015-07-10 | Disposition: A | Discharge status: Home / Self Care | Payer: Medicare Other | Source: Ambulatory Visit | Attending: Cardiovascular Disease | Admitting: Cardiovascular Disease

## 2015-07-10 DIAGNOSIS — E119 Type 2 diabetes mellitus without complications: Secondary | ICD-10-CM | POA: Insufficient documentation

## 2015-07-10 DIAGNOSIS — I739 Peripheral vascular disease, unspecified: Secondary | ICD-10-CM | POA: Diagnosis not present

## 2015-07-10 DIAGNOSIS — F17210 Nicotine dependence, cigarettes, uncomplicated: Secondary | ICD-10-CM | POA: Diagnosis not present

## 2015-07-10 DIAGNOSIS — I2582 Chronic total occlusion of coronary artery: Secondary | ICD-10-CM | POA: Diagnosis not present

## 2015-07-10 DIAGNOSIS — Z96642 Presence of left artificial hip joint: Secondary | ICD-10-CM | POA: Insufficient documentation

## 2015-07-10 DIAGNOSIS — K219 Gastro-esophageal reflux disease without esophagitis: Secondary | ICD-10-CM | POA: Insufficient documentation

## 2015-07-10 DIAGNOSIS — G454 Transient global amnesia: Secondary | ICD-10-CM | POA: Insufficient documentation

## 2015-07-10 DIAGNOSIS — I4892 Unspecified atrial flutter: Secondary | ICD-10-CM | POA: Insufficient documentation

## 2015-07-10 DIAGNOSIS — Z794 Long term (current) use of insulin: Secondary | ICD-10-CM | POA: Diagnosis not present

## 2015-07-10 DIAGNOSIS — I209 Angina pectoris, unspecified: Secondary | ICD-10-CM

## 2015-07-10 DIAGNOSIS — M1990 Unspecified osteoarthritis, unspecified site: Secondary | ICD-10-CM | POA: Insufficient documentation

## 2015-07-10 DIAGNOSIS — Z7984 Long term (current) use of oral hypoglycemic drugs: Secondary | ICD-10-CM | POA: Insufficient documentation

## 2015-07-10 DIAGNOSIS — Z7982 Long term (current) use of aspirin: Secondary | ICD-10-CM | POA: Diagnosis not present

## 2015-07-10 DIAGNOSIS — I10 Essential (primary) hypertension: Secondary | ICD-10-CM | POA: Diagnosis not present

## 2015-07-10 DIAGNOSIS — R079 Chest pain, unspecified: Secondary | ICD-10-CM | POA: Diagnosis present

## 2015-07-10 DIAGNOSIS — J449 Chronic obstructive pulmonary disease, unspecified: Secondary | ICD-10-CM | POA: Diagnosis not present

## 2015-07-10 DIAGNOSIS — E785 Hyperlipidemia, unspecified: Secondary | ICD-10-CM | POA: Diagnosis not present

## 2015-07-10 DIAGNOSIS — I2510 Atherosclerotic heart disease of native coronary artery without angina pectoris: Secondary | ICD-10-CM | POA: Insufficient documentation

## 2015-07-10 DIAGNOSIS — I208 Other forms of angina pectoris: Secondary | ICD-10-CM

## 2015-07-10 HISTORY — PX: CARDIAC CATHETERIZATION: SHX172

## 2015-07-10 LAB — GLUCOSE, CAPILLARY: GLUCOSE-CAPILLARY: 101 mg/dL — AB (ref 65–99)

## 2015-07-10 SURGERY — LEFT HEART CATH AND CORONARY ANGIOGRAPHY

## 2015-07-10 MED ORDER — VERAPAMIL HCL 2.5 MG/ML IV SOLN
INTRAVENOUS | Status: AC
  Filled 2015-07-10: qty 2

## 2015-07-10 MED ORDER — MIDAZOLAM HCL 2 MG/2ML IJ SOLN
INTRAMUSCULAR | Status: DC | PRN
  Administered 2015-07-10: 2 mg via INTRAVENOUS

## 2015-07-10 MED ORDER — HEPARIN (PORCINE) IN NACL 2-0.9 UNIT/ML-% IJ SOLN
INTRAMUSCULAR | Status: AC
  Filled 2015-07-10: qty 1500

## 2015-07-10 MED ORDER — ACETAMINOPHEN 325 MG PO TABS: 650.0000 mg | ORAL_TABLET | ORAL | Status: DC | PRN

## 2015-07-10 MED ORDER — SODIUM CHLORIDE 0.9 % IJ SOLN: 3.0000 mL | Freq: Two times a day (BID) | INTRAMUSCULAR | Status: DC

## 2015-07-10 MED ORDER — SODIUM CHLORIDE 0.9 % IV SOLN: INTRAVENOUS | Status: DC

## 2015-07-10 MED ORDER — SODIUM CHLORIDE 0.9 % IJ SOLN: 3.0000 mL | INTRAMUSCULAR | Status: DC | PRN

## 2015-07-10 MED ORDER — SODIUM CHLORIDE 0.9 % IV SOLN: 250.0000 mL | INTRAVENOUS | Status: DC | PRN

## 2015-07-10 MED ORDER — LIDOCAINE HCL (PF) 1 % IJ SOLN
INTRAMUSCULAR | Status: AC
  Filled 2015-07-10: qty 30

## 2015-07-10 MED ORDER — VERAPAMIL HCL 2.5 MG/ML IV SOLN
INTRAVENOUS | Status: DC | PRN
  Administered 2015-07-10: 10 mL via INTRA_ARTERIAL

## 2015-07-10 MED ORDER — ASPIRIN 81 MG PO CHEW: 81.0000 mg | CHEWABLE_TABLET | ORAL | Status: DC

## 2015-07-10 MED ORDER — LIDOCAINE HCL (PF) 1 % IJ SOLN
INTRAMUSCULAR | Status: DC | PRN
  Administered 2015-07-10: 5 mL

## 2015-07-10 MED ORDER — HEPARIN (PORCINE) IN NACL 2-0.9 UNIT/ML-% IJ SOLN
INTRAMUSCULAR | Status: DC | PRN
  Administered 2015-07-10: 500 mL

## 2015-07-10 MED ORDER — IOHEXOL 350 MG/ML SOLN
INTRAVENOUS | Status: DC | PRN
  Administered 2015-07-10: 85 mL via INTRA_ARTERIAL

## 2015-07-10 MED ORDER — FENTANYL CITRATE (PF) 100 MCG/2ML IJ SOLN
INTRAMUSCULAR | Status: AC
  Filled 2015-07-10: qty 4

## 2015-07-10 MED ORDER — ONDANSETRON HCL 4 MG/2ML IJ SOLN: 4.0000 mg | Freq: Four times a day (QID) | INTRAMUSCULAR | Status: DC | PRN

## 2015-07-10 MED ORDER — MIDAZOLAM HCL 2 MG/2ML IJ SOLN
INTRAMUSCULAR | Status: AC
  Filled 2015-07-10: qty 4

## 2015-07-10 MED ORDER — FENTANYL CITRATE (PF) 100 MCG/2ML IJ SOLN
INTRAMUSCULAR | Status: DC | PRN
  Administered 2015-07-10: 25 ug via INTRAVENOUS

## 2015-07-10 MED ORDER — SODIUM CHLORIDE 0.9 % IV SOLN
INTRAVENOUS | Status: DC
Start: 2015-07-10 — End: 2015-07-10
  Administered 2015-07-10: 10:00:00 via INTRAVENOUS

## 2015-07-10 MED ORDER — NITROGLYCERIN 1 MG/10 ML FOR IR/CATH LAB
INTRA_ARTERIAL | Status: AC
  Filled 2015-07-10: qty 10

## 2015-07-10 MED ORDER — HEPARIN SODIUM (PORCINE) 1000 UNIT/ML IJ SOLN
INTRAMUSCULAR | Status: AC
  Filled 2015-07-10: qty 1

## 2015-07-10 SURGICAL SUPPLY — 11 items

## 2015-07-10 NOTE — H&P (View-Only) (Signed)
.   Cardiology Office Note Date:  07/04/2015   ID:  Neil Carroll, DOB [birth year], MRN [id number]  PCP:  Unice Cobble, MD  Cardiologist:  Sherren Mocha, MD    Chief Complaint  Patient presents with  . Chest Pain   History of Present Illness: Neil Carroll is a 64 y.o. male who presents for followup evaluation. The patient has a history of atrial flutter status post redo frequency ablation, peripheral arterial disease, presumed CAD based on an abnormal stress Myoview, and ongoing tobacco abuse. A nuclear scan in 2013 was abnormal but low risk and medical therapy was recommended. He has known SFA occlusive disease bilaterally.   He reports occasional episodes of resting chest pressure, 'uneasiness,' last occurring one week ago. Symptoms last 15-30 minutes.  Has also had episodes of shortness of breath with activity, using a trimmer in his yard.  Admits to orthopnea and PND, leg swelling. Chest pressure occurs with exertion as well. Describes symptoms as non-radiating. No clear exacerbating or alleviating factors. He has never taken NTG.   Past Medical History  Diagnosis Date  . Other peripheral vascular disease(443.89)     bilateral lower extremity  . Hyperlipidemia   . Hypertension   . Transient global amnesia   . Contact dermatitis and other eczema due to plants (except food)   . Tobacco use disorder     dependent  . Atrial flutter ([place])     onset  2011. s/p EPS/RFA 12/2011  . Type II diabetes mellitus ([place])   . Chronic bronchitis   . GERD (gastroesophageal reflux disease)   . LV dysfunction     EF 45-50% 12/2011  . Adenomatous colon polyp   . COPD (chronic obstructive pulmonary disease) ([place])   . Arthritis     left hip replacement  . Cataract   . Tuberculosis     Past Surgical History  Procedure Laterality Date  . Partial hip arthroplasty Right 01/2000    "Right; replaced ball & stem"  . Partial hip arthroplasty Left   . Cystoscopy  11/2007    Dr Jeffie Pollock   . Cardiac electrophysiology mapping and ablation  03/2010  . Tee without cardioversion  12/28/2011    Procedure: TRANSESOPHAGEAL ECHOCARDIOGRAM (TEE);  Surgeon: Larey Dresser, MD;  Location: [facility];  Service: Cardiovascular;  Laterality: N/A;  . Colonoscopy with polypectomy  2006 & 2011    Dr [name]  . A flutter ablation N/A 12/28/2011    Procedure: ABLATION A FLUTTER;  Surgeon: Evans Lance, MD;  Location: [facility] CATH LAB;  Service: Cardiovascular;  Laterality: N/A;  . Colonoscopy    . Polypectomy      Current Outpatient Prescriptions  Medication Sig Dispense Refill  . ammonium lactate (AMLACTIN) 12 % cream Apply topically as needed for dry skin.    [name] aspirin 325 MG tablet Take 325 mg by mouth daily.    . Calcitriol (VECTICAL) 3 MCG/GM cream Apply topically at bedtime.     . cilostazol (PLETAL) 100 MG tablet Take 1 tablet (100 mg total) by mouth 2 (two) times daily. 60 tablet 2  . clobetasol (OLUX) 0.05 % topical foam Apply topically 2 (two) times daily.   0  . etanercept (ENBREL) 50 MG/ML injection Inject 50 mg into the skin. Twice a week    . Flaxseed, Linseed, (FLAX SEED OIL) 1000 MG CAPS Take 1 capsule by mouth 2 (two) times daily.     . Fluticasone-Salmeterol (ADVAIR DISKUS) 100-50 MCG/DOSE AEPB inhale 1  dose by mouth twice a day 180 each 2  . glucose blood (ONE TOUCH ULTRA TEST) test strip Use to test blood sugar daily ICDE11 69 100 each 3  . halobetasol (ULTRAVATE) 0.05 % cream Apply topically 2 (two) times daily.    . hydrochlorothiazide (HYDRODIURIL) 25 MG tablet take 1 tablet by mouth once daily 90 tablet 0  . Insulin Detemir (LEVEMIR FLEXTOUCH) 100 UNIT/ML Pen INJECT 24 UNITS INTO THE   SKIN DAILY 30 mL 1  . Insulin Pen Needle (NOVOFINE) 32G X 6 MM MISC Use 2 needles daily with Levimir pen and Victoza pen  DX: E11.69 100 each 5  . ketoconazole (NIZORAL) 2 % cream Apply topically 2 (two) times daily.      . Liraglutide 18 MG/3ML SOPN Inject 0.3 mLs (1.8 mg total) into the  skin daily. 9 pen 1  . losartan (COZAAR) 100 MG tablet take 1 tablet by mouth once daily 30 tablet 1  . metformin (FORTAMET) 1000 MG (OSM) 24 hr tablet TAKE 1 TABLET TWICE A DAY 180 tablet 1  . metoprolol (LOPRESSOR) 100 MG tablet take 1 tablet by mouth twice a day 180 tablet 0  . mupirocin ointment (BACTROBAN) 2 % Apply 1 application topically 2 (two) times daily.    . Omega-3 Fatty Acids (FISH OIL) 1000 MG CAPS Take 2 capsules by mouth 2 (two) times daily.     Glory Rosebush DELICA LANCETS FINE MISC ICD 10 E11 69 TEST BLOOD SUGAR DAILY 200 each 12  . rosuvastatin (CRESTOR) 20 MG tablet take 1/2  tablet by mouth once daily 90 tablet 3  . tamsulosin (FLOMAX) 0.4 MG CAPS capsule Take 0.4 mg by mouth daily.     [name] triamcinolone cream (KENALOG) 0.1 % Apply topically 2 (two) times daily.     No current facility-administered medications for this visit.    Allergies:   Niacin   Social History:  The patient  reports that he has been smoking Cigarettes.  He has a 44 pack-year smoking history. He has never used smokeless tobacco. He reports that he drinks alcohol. He reports that he does not use illicit drugs.   Family History:  The patient's  family history includes Crohn's disease in his son; Diabetes in his brother, brother, mother, and paternal grandmother; Heart attack (age of onset: 66) in his brother; Hypertension in his mother; Lung cancer in his father; Stroke (age of onset: 31) in his mother; Throat cancer in his paternal uncle. There is no history of Colon cancer, Esophageal cancer, Rectal cancer, or Stomach cancer.    ROS:  Please see the history of present illness.  Otherwise, review of systems is positive for leg swelling, waking up at night short of breath, cough, irregular heart beats, wheezing, and headaches.  All other systems are reviewed and negative.    PHYSICAL EXAM: VS:  BP 150/70 mmHg  Pulse 81  Ht '5\' 11"'$  (1.803 m)  Wt 269 lb (122.018 kg)  BMI 37.53 kg/m2 , BMI Body mass index  is 37.53 kg/(m^2). GEN: Well nourished, well developed, overweight male in no acute distress HEENT: normal Neck: no JVD, no masses. No carotid bruits Cardiac: RRR without murmur or gallop                Respiratory:  clear to auscultation bilaterally, normal work of breathing GI: soft, nontender, nondistended, + BS MS: no deformity or atrophy Ext: Trace pretibial edema bilaterally Skin: warm and dry, there is an ulceration on the left  lower leg, mid-tibial region Neuro:  Strength and sensation are intact Psych: euthymic mood, full affect  EKG:  EKG is ordered today. The ekg ordered today shows NSR with premature supraventricular contractions, nonspecific T wave changes  Recent Labs: 01/29/2015: Hemoglobin 16.4; Platelets 168.0 01/31/2015: ALT 34 05/21/2015: BUN 13; Creatinine, Ser 1.15; Potassium 4.0; Sodium 141 05/29/2015: TSH 1.31   Lipid Panel     Component Value Date/Time   CHOL 110 05/29/2015 1116   TRIG 96.0 05/29/2015 1116   HDL 38.20* 05/29/2015 1116   CHOLHDL 3 05/29/2015 1116   VLDL 19.2 05/29/2015 1116   LDLCALC 53 05/29/2015 1116      Wt Readings from Last 3 Encounters:  07/04/15 269 lb (122.018 kg)  05/29/15 269 lb (122.018 kg)  05/23/15 269 lb (122.018 kg)     Cardiac Studies Reviewed: 2D Echo 12/28/2011: Study Conclusions  - Left ventricle: Systolic function was low normal to mildly reduced. The estimated ejection fraction was in the range of 50% to 55%. - Aortic valve: There was no stenosis. Mild regurgitation. - Aorta: Normal caliber aorta with minimal plaque. - Mitral valve: Mild to moderate regurgitation. - Left atrium: The atrium was mildly dilated. No evidence of thrombus in the atrial cavity or appendage. - Right ventricle: The cavity size was normal. Systolic function was normal. - Right atrium: The atrium was mildly dilated. - Atrial septum: No defect or patent foramen ovale was identified. Echo contrast study showed no  right-to-left atrial level shunt, at baseline or with provocation. - Tricuspid valve: Peak RV-RA gradient: 81m Hg (S).  ASSESSMENT AND PLAN: 1.  Coronary artery disease, native vessel. Pt now having symptoms of angina at rest and with activity. He had an abnormal stress Myoview 3 years ago with medical therapy recommended. He has multiple ongoing risk factors for Coronary artery disease, including tobacco abuse, diabetes, and HTN. We discussed diagnostic options and favor definitive evaluation with cardiac catheterization and possible PCI considering his high-risk profile and worsening symptoms.   I have reviewed the risks, indications, and alternatives to cardiac catheterization and possible PCI with the patient. Risks include but are not limited to bleeding, infection, vascular injury, stroke, myocardial infection, arrhythmia, kidney injury, radiation-related injury in the case of prolonged fluoroscopy use, emergency cardiac surgery, and death. The patient understands the risks of serious complication is low (<<8%.   He will continue ASA, crestor, metoprolol, and losartan. He is not inclined to start another medication at this point, so I decided not to start a long-acting nitrate. Further plans pending cath results.  2. Peripheral arterial disease with intermittent claudication. Stable, non-lifestyle limiting calf claudication. He is undergoing treatment for a right leg ulceration. He continues on cilostazol.  3. Hypertension. Blood pressure is elevated today but stable on review of multiple office visits. He will continue on a combination of hydrochlorothiazide, losartan, and metoprolol.  4. Tobacco abuse. Cessation counseling done. He is well educated and understands the ramifications of continued tobacco. He will do his best to quit or cut back.  5. Hyperlipidemia. Lipids are at goal. These were reviewed today. He remains on Crestor 20 mg daily.  6. Type 2 diabetes. Managed by Dr. KDwyane Dee    7. Atrial flutter. Maintaining sinus rhythm after RF ablation   Current medicines are reviewed with the patient today.  The patient does not have concerns regarding medicines.  Labs/ tests ordered today include:  No orders of the defined types were placed in this encounter.   Disposition:  FU one year  Signed, Sherren Mocha, MD  07/04/2015 9:24 AM    [facility] [place], [place],   [address] Phone: [phone]; Fax: [phone]

## 2015-07-10 NOTE — Discharge Instructions (Signed)
Radial Site Care Refer to this sheet in the next few weeks. These instructions provide you with information about caring for yourself after your procedure. Your health care provider may also give you more specific instructions. Your treatment has been planned according to current medical practices, but problems sometimes occur. Call your health care provider if you have any problems or questions after your procedure. WHAT TO EXPECT AFTER THE PROCEDURE After your procedure, it is typical to have the following:  Bruising at the radial site that usually fades within 1-2 weeks.  Blood collecting in the tissue (hematoma) that may be painful to the touch. It should usually decrease in size and tenderness within 1-2 weeks. HOME CARE INSTRUCTIONS  Take medicines only as directed by your health care provider.  You may shower 24-48 hours after the procedure or as directed by your health care provider. Remove the bandage (dressing) and gently wash the site with plain soap and water. Pat the area dry with a clean towel. Do not rub the site, because this may cause bleeding.  Do not take baths, swim, or use a hot tub until your health care provider approves.  Check your insertion site every day for redness, swelling, or drainage.  Do not apply powder or lotion to the site.  Do not flex or bend the affected arm for 24 hours or as directed by your health care provider.  Do not push or pull heavy objects with the affected arm for 24 hours or as directed by your health care provider.  Do not lift over 10 lb (4.5 kg) for 5 days after your procedure or as directed by your health care provider.  Ask your health care provider when it is okay to:  Return to work or school.  Resume usual physical activities or sports.  Resume sexual activity.  Do not drive home if you are discharged the same day as the procedure. Have someone else drive you.  You may drive 24 hours after the procedure unless otherwise  instructed by your health care provider.  Do not operate machinery or power tools for 24 hours after the procedure.  If your procedure was done as an outpatient procedure, which means that you went home the same day as your procedure, a responsible adult should be with you for the first 24 hours after you arrive home.  Keep all follow-up visits as directed by your health care provider. This is important. SEEK MEDICAL CARE IF:  You have a fever.  You have chills.  You have increased bleeding from the radial site. Hold pressure on the site. SEEK IMMEDIATE MEDICAL CARE IF:  You have unusual pain at the radial site.  You have redness, warmth, or swelling at the radial site.  You have drainage (other than a small amount of blood on the dressing) from the radial site.  The radial site is bleeding, hold steady pressure on the site and call 911.  Your arm or hand becomes pale, cool, tingly, or numb.   This information is not intended to replace advice given to you by your health care provider. Make sure you discuss any questions you have with your health care provider.   Document Released: 10/10/2010 Document Revised: 09/28/2014 Document Reviewed: 03/26/2014 Elsevier Interactive Patient Education [employer].

## 2015-07-10 NOTE — Interval H&P Note (Signed)
Cath Lab Visit (complete for each Cath Lab visit)  Clinical Evaluation Leading to the Procedure:   ACS: No.  Non-ACS:    Anginal Classification: CCS III  Anti-ischemic medical therapy: Minimal Therapy (1 class of medications)  Non-Invasive Test Results: No non-invasive testing performed  Prior CABG: No previous CABG      History and Physical Interval Note:  07/10/2015 11:58 AM  Georgann Housekeeper Sr.  has presented today for surgery, with the diagnosis of cp  The various methods of treatment have been discussed with the patient and family. After consideration of risks, benefits and other options for treatment, the patient has consented to  Procedure(s): Left Heart Cath and Coronary Angiography (N/A) as a surgical intervention .  The patient's history has been reviewed, patient examined, no change in status, stable for surgery.  I have reviewed the patient's chart and labs.  Questions were answered to the patient's satisfaction.     Sherren Mocha

## 2015-07-11 ENCOUNTER — Encounter (HOSPITAL_COMMUNITY): Payer: Self-pay | Admitting: Cardiovascular Disease

## 2015-07-15 DIAGNOSIS — E1151 Type 2 diabetes mellitus with diabetic peripheral angiopathy without gangrene: Secondary | ICD-10-CM | POA: Diagnosis not present

## 2015-07-15 DIAGNOSIS — I83028 Varicose veins of left lower extremity with ulcer other part of lower leg: Secondary | ICD-10-CM | POA: Diagnosis not present

## 2015-07-15 DIAGNOSIS — S91302D Unspecified open wound, left foot, subsequent encounter: Secondary | ICD-10-CM | POA: Diagnosis not present

## 2015-07-16 ENCOUNTER — Telehealth: Payer: Self-pay | Admitting: Endocrinology

## 2015-07-16 NOTE — Telephone Encounter (Signed)
Elmyra Ricks from [facility] need a verbal for Glucose test strips, phone # [phone]

## 2015-07-16 NOTE — Telephone Encounter (Signed)
Denied, this company is fishing for an rx.  Patient does not use this pharmacy.

## 2015-07-19 ENCOUNTER — Telehealth: Payer: Self-pay | Admitting: Endocrinology

## 2015-07-19 NOTE — Telephone Encounter (Signed)
[facility] calling [phone] for verbal orders for test strips please

## 2015-07-19 NOTE — Telephone Encounter (Signed)
Patient does not use this pharmacy, it's a "fishing" company.

## 2015-07-24 ENCOUNTER — Other Ambulatory Visit: Payer: Self-pay | Admitting: *Deleted

## 2015-07-24 ENCOUNTER — Other Ambulatory Visit: Payer: Self-pay | Admitting: Cardiovascular Disease

## 2015-07-24 ENCOUNTER — Telehealth: Payer: Self-pay | Admitting: Endocrinology

## 2015-07-24 MED ORDER — INSULIN DETEMIR 100 UNIT/ML FLEXPEN: PEN_INJECTOR | SUBCUTANEOUS | Status: DC

## 2015-07-24 NOTE — Telephone Encounter (Signed)
Pt needs [name] to call in the insulin levemir to [facility] for him he has enough for a few weeks but he has to order it

## 2015-07-29 DIAGNOSIS — E1151 Type 2 diabetes mellitus with diabetic peripheral angiopathy without gangrene: Secondary | ICD-10-CM | POA: Diagnosis not present

## 2015-07-29 DIAGNOSIS — R600 Localized edema: Secondary | ICD-10-CM | POA: Diagnosis not present

## 2015-07-29 DIAGNOSIS — L97829 Non-pressure chronic ulcer of other part of left lower leg with unspecified severity: Secondary | ICD-10-CM | POA: Diagnosis not present

## 2015-07-29 DIAGNOSIS — L97919 Non-pressure chronic ulcer of unspecified part of right lower leg with unspecified severity: Secondary | ICD-10-CM | POA: Diagnosis not present

## 2015-08-12 DIAGNOSIS — I83018 Varicose veins of right lower extremity with ulcer other part of lower leg: Secondary | ICD-10-CM | POA: Diagnosis not present

## 2015-08-12 DIAGNOSIS — S81801D Unspecified open wound, right lower leg, subsequent encounter: Secondary | ICD-10-CM | POA: Diagnosis not present

## 2015-08-12 DIAGNOSIS — E1151 Type 2 diabetes mellitus with diabetic peripheral angiopathy without gangrene: Secondary | ICD-10-CM | POA: Diagnosis not present

## 2015-08-20 ENCOUNTER — Other Ambulatory Visit: Payer: Self-pay | Admitting: Cardiovascular Disease

## 2015-08-26 DIAGNOSIS — I70293 Other atherosclerosis of native arteries of extremities, bilateral legs: Secondary | ICD-10-CM | POA: Diagnosis not present

## 2015-08-26 DIAGNOSIS — E1351 Other specified diabetes mellitus with diabetic peripheral angiopathy without gangrene: Secondary | ICD-10-CM | POA: Diagnosis not present

## 2015-08-26 DIAGNOSIS — L602 Onychogryphosis: Secondary | ICD-10-CM | POA: Diagnosis not present

## 2015-08-29 ENCOUNTER — Other Ambulatory Visit: Payer: Self-pay | Admitting: Emergency Medicine

## 2015-08-29 ENCOUNTER — Telehealth: Payer: Self-pay | Admitting: Internal Medicine

## 2015-08-29 MED ORDER — CILOSTAZOL 100 MG PO TABS: 100.0000 mg | ORAL_TABLET | Freq: Two times a day (BID) | ORAL | Status: DC

## 2015-08-29 NOTE — Telephone Encounter (Signed)
Pt requesting refill for cilostazol (PLETAL) 100 MG tablet [[id number] Pharmacy is [facility] on [address]

## 2015-09-12 DIAGNOSIS — E1351 Other specified diabetes mellitus with diabetic peripheral angiopathy without gangrene: Secondary | ICD-10-CM | POA: Diagnosis not present

## 2015-09-12 DIAGNOSIS — E1151 Type 2 diabetes mellitus with diabetic peripheral angiopathy without gangrene: Secondary | ICD-10-CM | POA: Diagnosis not present

## 2015-09-12 DIAGNOSIS — S81801D Unspecified open wound, right lower leg, subsequent encounter: Secondary | ICD-10-CM | POA: Diagnosis not present

## 2015-09-12 DIAGNOSIS — I83018 Varicose veins of right lower extremity with ulcer other part of lower leg: Secondary | ICD-10-CM | POA: Diagnosis not present

## 2015-09-12 DIAGNOSIS — I70293 Other atherosclerosis of native arteries of extremities, bilateral legs: Secondary | ICD-10-CM | POA: Diagnosis not present

## 2015-09-19 ENCOUNTER — Other Ambulatory Visit (INDEPENDENT_AMBULATORY_CARE_PROVIDER_SITE_OTHER): Payer: Medicare Other

## 2015-09-19 DIAGNOSIS — E119 Type 2 diabetes mellitus without complications: Secondary | ICD-10-CM | POA: Diagnosis not present

## 2015-09-19 DIAGNOSIS — E669 Obesity, unspecified: Secondary | ICD-10-CM | POA: Diagnosis not present

## 2015-09-19 DIAGNOSIS — E1169 Type 2 diabetes mellitus with other specified complication: Secondary | ICD-10-CM

## 2015-09-19 LAB — COMPREHENSIVE METABOLIC PANEL
ALT: 18 U/L (ref 0–53)
AST: 15 U/L (ref 0–37)
Albumin: 4.1 g/dL (ref 3.5–5.2)
Alkaline Phosphatase: 45 U/L (ref 39–117)
BILIRUBIN TOTAL: 0.3 mg/dL (ref 0.2–1.2)
BUN: 16 mg/dL (ref 6–23)
CALCIUM: 9.4 mg/dL (ref 8.4–10.5)
CHLORIDE: 104 meq/L (ref 96–112)
CO2: 27 meq/L (ref 19–32)
Creatinine, Ser: 1.34 mg/dL (ref 0.40–1.50)
GFR: 68.71 mL/min (ref >60.00)
Glucose, Bld: 117 mg/dL — ABNORMAL HIGH (ref 70–99)
Potassium: 4.1 mEq/L (ref 3.5–5.1)
Sodium: 140 mEq/L (ref 135–145)
Total Protein: 7.5 g/dL (ref 6.0–8.3)

## 2015-09-19 LAB — HEMOGLOBIN A1C: Hgb A1c MFr Bld: 7 % — ABNORMAL HIGH (ref 4.6–6.5)

## 2015-09-24 ENCOUNTER — Encounter: Payer: Self-pay | Admitting: Endocrinology

## 2015-09-24 ENCOUNTER — Ambulatory Visit (INDEPENDENT_AMBULATORY_CARE_PROVIDER_SITE_OTHER): Payer: Medicare Other | Admitting: Endocrinology

## 2015-09-24 ENCOUNTER — Other Ambulatory Visit: Payer: Self-pay | Admitting: Endocrinology

## 2015-09-24 VITALS — BP 130/66 | HR 86 | Temp 98.2°F | Resp 16 | Ht 71.0 in | Wt 275.8 lb

## 2015-09-24 DIAGNOSIS — R600 Localized edema: Secondary | ICD-10-CM | POA: Diagnosis not present

## 2015-09-24 DIAGNOSIS — E1169 Type 2 diabetes mellitus with other specified complication: Secondary | ICD-10-CM

## 2015-09-24 DIAGNOSIS — E669 Obesity, unspecified: Secondary | ICD-10-CM

## 2015-09-24 DIAGNOSIS — E119 Type 2 diabetes mellitus without complications: Secondary | ICD-10-CM | POA: Diagnosis not present

## 2015-09-24 NOTE — Progress Notes (Signed)
k          Patient ID: Neil Mam., male   DOB: 05/10/1951, 65 y.o.   MRN: [id number]   Reason for Appointment : Followup for Type 2 Diabetes  History of Present Illness          Diagnosis: Type 2 diabetes mellitus, date of diagnosis:  2008     Past history: He has been treated with metformin only for several years and had relatively good control until about a year ago. However had been taking only 1000 mg of metformin. Because of his progressively higher blood sugars he had been started on Levemir insulin since 6/14.  However with this his A1c was still 10.2 in 9/14 and he was referred here With increasing Victoza to 1.8 mg in 3/15 his weight had come down and his blood sugars had improved   Recent history:   INSULIN regimen is described as:  Levemir 24 units at 5 pm  Although his A1c had been  Down to 6.7 it is relatively higher at 7% now   He has gained weight and likely is not being consistent on the diet recently  Also has still not been motivated to exercise even though he has a treadmill at home   He is using a prodigy monitor currently sent by his mail-order company Fasting blood sugars:  117 in the lab and 134 at home today   He is still taking Victoza 1.8 mg without side effects.   He takes  Victoza in the late afternoon with his LEVEMIR.  He thinks he remembers to take the injections better in the afternoon    Oral hypoglycemic drugs the patient is taking are: Metformin 2g Side effects from medications have been: None Compliance with the medical regimen: Fair   Glucose monitoring: Irregular        Glucometer: One Touch.      Blood Glucose readings from Review show average 122 for the last 4 weeks , checking blood sugar mostly in the afternoon and some at night  Hypoglycemia frequency: Never.           Self-care:   Meals: 2/day. 11 am 6 pm.  He is eating out less regularly   Physical activity: exercise: not walking         Dietician visit: Most recent:  09/2013                Retinal exam: Most recent: 2 years ago.   Wt Readings from Last 3 Encounters:  09/24/15 275 lb 12.8 oz (125.102 kg)  07/10/15 269 lb (122.018 kg)  07/04/15 269 lb (122.018 kg)       Lab Results  Component Value Date   HGBA1C 7.0* 09/19/2015   HGBA1C 6.7* 05/21/2015   HGBA1C 7.0* 01/31/2015   Lab Results  Component Value Date   MICROALBUR 4.1* 01/31/2015   LDLCALC 53 05/29/2015   CREATININE 1.34 09/19/2015   Appointment on 09/19/2015  Component Date Value Ref Range Status  . Hgb A1c MFr Bld 09/19/2015 7.0* 4.6 - 6.5 % Final   Glycemic Control Guidelines for People with Diabetes:Non Diabetic:  <6%Goal of Therapy: <7%Additional Action Suggested:  >8%   . Sodium 09/19/2015 140  135 - 145 mEq/L Final  . Potassium 09/19/2015 4.1  3.5 - 5.1 mEq/L Final  . Chloride 09/19/2015 104  96 - 112 mEq/L Final  . CO2 09/19/2015 27  19 - 32 mEq/L Final  . Glucose, Bld 09/19/2015 117* 70 -  99 mg/dL Final  . BUN 09/19/2015 16  6 - 23 mg/dL Final  . Creatinine, Ser 09/19/2015 1.34  0.40 - 1.50 mg/dL Final  . Total Bilirubin 09/19/2015 0.3  0.2 - 1.2 mg/dL Final  . Alkaline Phosphatase 09/19/2015 45  39 - 117 U/L Final  . AST 09/19/2015 15  0 - 37 U/L Final  . ALT 09/19/2015 18  0 - 53 U/L Final  . Total Protein 09/19/2015 7.5  6.0 - 8.3 g/dL Final  . Albumin 09/19/2015 4.1  3.5 - 5.2 g/dL Final  . Calcium 09/19/2015 9.4  8.4 - 10.5 mg/dL Final  . GFR 09/19/2015 68.71  >60.00 mL/min Final       Medication List       This list is accurate as of: 09/24/15  9:28 AM.  Always use your most recent med list.               acetaminophen 650 MG CR tablet  Commonly known as:  TYLENOL  Take 650 mg by mouth every 8 (eight) hours as needed for pain.     aspirin 325 MG tablet  Take 325 mg by mouth daily.     cilostazol 100 MG tablet  Commonly known as:  PLETAL  Take 1 tablet (100 mg total) by mouth 2 (two) times daily.     clobetasol 0.05 % topical foam  Commonly  known as:  OLUX  Apply topically 2 (two) times daily.     CRESTOR 20 MG tablet  Generic drug:  rosuvastatin  Take 10 mg by mouth daily. take 1/2  tablet by mouth once daily     etanercept 50 MG/ML injection  Commonly known as:  ENBREL  Inject 50 mg into the skin 2 (two) times a week. Twice a week     Fish Oil 1000 MG Caps  Take 2 capsules by mouth 2 (two) times daily.     Flax Seed Oil 1000 MG Caps  Take 1 capsule by mouth 2 (two) times daily.     Fluticasone-Salmeterol 100-50 MCG/DOSE Aepb  Commonly known as:  ADVAIR DISKUS  inhale 1 dose by mouth twice a day     hydrochlorothiazide 25 MG tablet  Commonly known as:  HYDRODIURIL  take 1 tablet by mouth once daily     Insulin Detemir 100 UNIT/ML Pen  Commonly known as:  LEVEMIR FLEXTOUCH  INJECT 24 UNITS INTO THE   SKIN DAILY     Insulin Pen Needle 32G X 6 MM Misc  Commonly known as:  NOVOFINE  Use 2 needles daily with Levimir pen and Victoza pen  DX: E11.69     Liraglutide 18 MG/3ML Sopn  Inject 0.3 mLs (1.8 mg total) into the skin daily.     losartan 100 MG tablet  Commonly known as:  COZAAR  take 1 tablet by mouth once daily     metformin 1000 MG (OSM) 24 hr tablet  Commonly known as:  FORTAMET  TAKE 1 TABLET TWICE A DAY     metoprolol 100 MG tablet  Commonly known as:  LOPRESSOR  take 1 tablet by mouth twice a day     mupirocin ointment 2 %  Commonly known as:  BACTROBAN  Apply 1 application topically 2 (two) times daily as needed (affected areas).     tamsulosin 0.4 MG Caps capsule  Commonly known as:  FLOMAX  Take 0.4 mg by mouth daily.     triamcinolone cream 0.1 %  Commonly known as:  KENALOG  Apply 1 application topically 2 (two) times daily as needed (affected area).        Allergies:  Allergies  Allergen Reactions  . Niacin Other (See Comments)    REACTION: upset stomach    Past Medical History  Diagnosis Date  . Other peripheral vascular disease(443.89)     bilateral lower extremity    . Hyperlipidemia   . Hypertension   . Transient global amnesia   . Contact dermatitis and other eczema due to plants (except food)   . Tobacco use disorder     dependent  . Atrial flutter ([place])     onset  2011. s/p EPS/RFA 12/2011  . Type II diabetes mellitus ([place])   . Chronic bronchitis   . GERD (gastroesophageal reflux disease)   . LV dysfunction     EF 45-50% 12/2011  . Adenomatous colon polyp   . COPD (chronic obstructive pulmonary disease) ([place])   . Arthritis     left hip replacement  . Cataract   . Tuberculosis     Past Surgical History  Procedure Laterality Date  . Partial hip arthroplasty Right 01/2000    "Right; replaced ball & stem"  . Partial hip arthroplasty Left   . Cystoscopy  11/2007    Dr Jeffie Pollock  . Cardiac electrophysiology mapping and ablation  03/2010  . Tee without cardioversion  12/28/2011    Procedure: TRANSESOPHAGEAL ECHOCARDIOGRAM (TEE);  Surgeon: Larey Dresser, MD;  Location: [facility];  Service: Cardiovascular;  Laterality: N/A;  . Colonoscopy with polypectomy  2006 & 2011    Dr [name]  . A flutter ablation N/A 12/28/2011    Procedure: ABLATION A FLUTTER;  Surgeon: Evans Lance, MD;  Location[facility] C[facility] LAB;  Service: Cardiovascular;  Laterality: N/A;  . Colonoscopy    . Polypectomy    . Cardiac catheterization N/A 07/10/2015    Procedure: Left Heart Cath and Coronary Angiography;  Surgeon: Sherren Mocha, MD;  Location: [facility] CV LAB;  Service: Cardiovascular;  Laterality: N/A;    Family History  Problem Relation Age of Onset  . Stroke Mother 58  . Hypertension Mother   . Diabetes Mother   . Lung cancer Father     smoker  . Diabetes Paternal Grandmother   . Diabetes Brother     MI @ 32  . Diabetes Brother     Hepatitis C  . Throat cancer Paternal Uncle     1/2 uncle  . Heart attack Brother 36  . Colon cancer Neg Hx   . Esophageal cancer Neg Hx   . Rectal cancer Neg Hx   . Stomach cancer Neg Hx   . Crohn's disease Son      Social History:  reports that he has been smoking Cigarettes.  He has a 44 pack-year smoking history. He has never used smokeless tobacco. He reports that he drinks alcohol. He reports that he does not use illicit drugs.    Review of Systems       Lipids: He has persistently low HDL, LDL well controlled with, taking Crestor  Lab Results  Component Value Date   CHOL 110 05/29/2015   HDL 38.20* 05/29/2015   LDLCALC 53 05/29/2015   TRIG 96.0 05/29/2015   CHOLHDL 3 05/29/2015              Has occasional  history of Numbness, tingling in the right first or second toes, no burning in feet, on B6 vitamins OTC Foot exam done  in 10/15 Diabetic foot exam in 9/16 showed normal monofilament sensation in the toes and plantar surfaces, no skin lesions or ulcers on the feet and absent pedal pulses   Physical Examination:  BP 130/66 mmHg  Pulse 86  Temp(Src) 98.2 F (36.8 C)  Resp 16  Ht '5\' 11"'$  (1.803 m)  Wt 275 lb 12.8 oz (125.102 kg)  BMI 38.48 kg/m2  SpO2 95%    He has 2+ ankle edema present    ASSESSMENT:  Diabetes type 2, uncontrolled     Currently on maximum dose of Victoza and metformin along with basal insulin  His control is fair with A1c 7% although was better on the last visit He has gained weight and not exercising  His blood sugars are fairly good at home and not significantly higher after meals Fasting blood sugars tend to be minimally increased but has not checked enough  PLAN:   Better compliance with  exercise, he can use his treadmill at home Start monitoring more readings at various times as discussed Levemir  unchanged at 24 units daily Emphasized the need for weight loss for prevention of long-term progression of his diabetes  Follow-up in 4 months  Discuss edema with his cardiologist or PCP as he likely needs Lasix instead of HCTZ  Patient Instructions  Check blood sugars on waking up  2-3  times a week Also check blood sugars about 2 hours  after a meal and do this after different meals by rotation  Recommended blood sugar levels on waking up is 90-130 and about 2 hours after meal is 130-160  Please bring your blood sugar monitor to each visit, thank you  Walk on treadmill  Use One touch meter      Dejon Lukas 09/24/2015, 9:28 AM   Note: This office note was prepared with Dragon voice recognition system technology. Any transcriptional errors that result from this process are unintentional.

## 2015-09-24 NOTE — Patient Instructions (Addendum)
Check blood sugars on waking up  2-3  times a week Also check blood sugars about 2 hours after a meal and do this after different meals by rotation  Recommended blood sugar levels on waking up is 90-130 and about 2 hours after meal is 130-160  Please bring your blood sugar monitor to each visit, thank you  Walk on treadmill  Use One touch meter

## 2015-09-26 DIAGNOSIS — L400 Psoriasis vulgaris: Secondary | ICD-10-CM | POA: Diagnosis not present

## 2015-09-30 ENCOUNTER — Ambulatory Visit (INDEPENDENT_AMBULATORY_CARE_PROVIDER_SITE_OTHER): Payer: Medicare Other | Admitting: Internal Medicine

## 2015-09-30 ENCOUNTER — Encounter: Payer: Self-pay | Admitting: Internal Medicine

## 2015-09-30 VITALS — BP 158/86 | HR 84 | Temp 98.1°F | Resp 18 | Ht 71.0 in | Wt 275.0 lb

## 2015-09-30 DIAGNOSIS — I10 Essential (primary) hypertension: Secondary | ICD-10-CM | POA: Diagnosis not present

## 2015-09-30 DIAGNOSIS — Z720 Tobacco use: Secondary | ICD-10-CM

## 2015-09-30 DIAGNOSIS — E785 Hyperlipidemia, unspecified: Secondary | ICD-10-CM

## 2015-09-30 DIAGNOSIS — J449 Chronic obstructive pulmonary disease, unspecified: Secondary | ICD-10-CM

## 2015-09-30 DIAGNOSIS — I739 Peripheral vascular disease, unspecified: Secondary | ICD-10-CM

## 2015-09-30 DIAGNOSIS — F17200 Nicotine dependence, unspecified, uncomplicated: Secondary | ICD-10-CM

## 2015-09-30 MED ORDER — FUROSEMIDE 40 MG PO TABS: 40.0000 mg | ORAL_TABLET | Freq: Every day | ORAL | Status: DC

## 2015-09-30 NOTE — Assessment & Plan Note (Signed)
Edema is stable, will change hctz to lasix to help more with edema in his legs. Has not taken his meds today. Continue losartan and metoprolol.

## 2015-09-30 NOTE — Assessment & Plan Note (Signed)
Does have some mild wheezing on exam, he denies worsening SOB. Will not treat with steroids given his tenuous control of his diabetes. He is taking the advair daily and will send in albuterol since he does not have any at home.

## 2015-09-30 NOTE — Progress Notes (Signed)
   Subjective:    Patient ID: Neil Mam., male    DOB: 1951/07/28, 65 y.o.   MRN: [id number]  HPI The patient is a 65 YO man coming in for follow up of his medical problems: his blood pressure (BP mildly elevated today with hctz, losartan, metoprolol, tamsulosin, complicated by PVD), his smoking (still smoking and has thought about quitting, tried years ago without success), and his PVD (taking pletal everyday, still smoking, some mild claudication symptoms 3-4 minutes on treadmill). No new symptoms. He would like to switch his hctz to a better fluid pill per the advice of his endocrinologist.   Review of Systems  Constitutional: Positive for activity change. Negative for fever, chills, appetite change, fatigue and unexpected weight change.  HENT: Positive for congestion and rhinorrhea. Negative for ear discharge, ear pain, sinus pressure, sore throat and trouble swallowing.   Eyes: Negative.   Respiratory: Positive for cough. Negative for chest tightness, shortness of breath and wheezing.   Cardiovascular: Positive for leg swelling. Negative for chest pain and palpitations.  Gastrointestinal: Negative for nausea, abdominal pain, diarrhea, constipation and abdominal distention.  Musculoskeletal: Negative for myalgias, back pain and arthralgias.  Skin: Positive for color change. Negative for rash.  Neurological: Negative.   Psychiatric/Behavioral: Negative.       Objective:   Physical Exam  Constitutional: He is oriented to person, place, and time. He appears well-developed and well-nourished.  Overweight  HENT:  Head: Normocephalic and atraumatic.  Eyes: EOM are normal.  Neck: Normal range of motion. No JVD present.  Cardiovascular: Normal rate and regular rhythm.   Pulmonary/Chest: Effort normal. No respiratory distress. He has wheezes. He has no rales.  Expiratory wheeze, right greater than left  Abdominal: Soft. Bowel sounds are normal. He exhibits no distension. There  is no tenderness. There is no rebound.  Musculoskeletal: He exhibits edema.  1-2 + edema in the legs, with color change consistent with venous stasis.   Lymphadenopathy:    He has no cervical adenopathy.  Neurological: He is alert and oriented to person, place, and time. Coordination normal.  Skin: Skin is warm and dry.   Filed Vitals:   09/30/15 1437  BP: 158/86  Pulse: 84  Temp: 98.1 F (36.7 C)  TempSrc: Oral  Resp: 18  Height: '5\' 11"'$  (1.803 m)  Weight: 275 lb (124.739 kg)  SpO2: 90%      Assessment & Plan:

## 2015-09-30 NOTE — Assessment & Plan Note (Signed)
Taking 1/2 pill crestor daily, last lipid panel at goal. Continue. No side effects from medicine.

## 2015-09-30 NOTE — Progress Notes (Signed)
Pre visit review using our clinic review tool, if applicable. No additional management support is needed unless otherwise documented below in the visit note. 

## 2015-09-30 NOTE — Assessment & Plan Note (Signed)
He is taking the pletal daily which helped some. He does still have claudication with 3 minutes of activity. He tries to avoid activity and I talked to him about working to build his stamina with graduated exercise. He is not sure he wants to do that. His focus is on personal comfort.

## 2015-09-30 NOTE — Patient Instructions (Signed)
We have sent in the new fluid pill that you can take when you run out of the hctz.   It is called lasix (furosemide) and you take 1 pill a day. This should help with fluid to keep your legs less swollen.   The other thing you can do is to keep the feet up.

## 2015-09-30 NOTE — Assessment & Plan Note (Signed)
Talked to him about smoking cessation, he is not interested at this time. Reminded him about the significant risk to his health with ongoing tobacco abuse.

## 2015-10-10 DIAGNOSIS — E1151 Type 2 diabetes mellitus with diabetic peripheral angiopathy without gangrene: Secondary | ICD-10-CM | POA: Diagnosis not present

## 2015-10-10 DIAGNOSIS — S81801D Unspecified open wound, right lower leg, subsequent encounter: Secondary | ICD-10-CM | POA: Diagnosis not present

## 2015-10-10 DIAGNOSIS — I83018 Varicose veins of right lower extremity with ulcer other part of lower leg: Secondary | ICD-10-CM | POA: Diagnosis not present

## 2015-10-10 DIAGNOSIS — I70293 Other atherosclerosis of native arteries of extremities, bilateral legs: Secondary | ICD-10-CM | POA: Diagnosis not present

## 2015-10-16 DIAGNOSIS — N401 Enlarged prostate with lower urinary tract symptoms: Secondary | ICD-10-CM | POA: Diagnosis not present

## 2015-10-23 ENCOUNTER — Other Ambulatory Visit: Payer: Self-pay | Admitting: Endocrinology

## 2015-10-23 DIAGNOSIS — Z0000 Encounter for general adult medical examination without abnormal findings: Secondary | ICD-10-CM | POA: Diagnosis not present

## 2015-10-23 DIAGNOSIS — N138 Other obstructive and reflux uropathy: Secondary | ICD-10-CM | POA: Diagnosis not present

## 2015-10-23 DIAGNOSIS — N401 Enlarged prostate with lower urinary tract symptoms: Secondary | ICD-10-CM | POA: Diagnosis not present

## 2015-10-23 DIAGNOSIS — N5201 Erectile dysfunction due to arterial insufficiency: Secondary | ICD-10-CM | POA: Diagnosis not present

## 2015-10-23 DIAGNOSIS — R351 Nocturia: Secondary | ICD-10-CM | POA: Diagnosis not present

## 2015-10-23 DIAGNOSIS — N3941 Urge incontinence: Secondary | ICD-10-CM | POA: Diagnosis not present

## 2015-10-25 ENCOUNTER — Other Ambulatory Visit: Payer: Self-pay

## 2015-10-25 DIAGNOSIS — E785 Hyperlipidemia, unspecified: Secondary | ICD-10-CM

## 2015-10-25 MED ORDER — CILOSTAZOL 100 MG PO TABS: 100.0000 mg | ORAL_TABLET | Freq: Two times a day (BID) | ORAL | Status: DC

## 2015-10-25 MED ORDER — ROSUVASTATIN CALCIUM 20 MG PO TABS: 10.0000 mg | ORAL_TABLET | Freq: Every day | ORAL | Status: DC

## 2015-10-25 NOTE — Telephone Encounter (Signed)
Pt called into Triage requesting 90 day supply of Crestor to [facility], and 90 day of Pletal to [facility]. Both medications refilled, pt aware

## 2015-10-28 ENCOUNTER — Telehealth: Payer: Self-pay | Admitting: *Deleted

## 2015-10-28 MED ORDER — ROSUVASTATIN CALCIUM 10 MG PO TABS: 10.0000 mg | ORAL_TABLET | Freq: Every day | ORAL | Status: DC

## 2015-10-28 NOTE — Addendum Note (Signed)
Addended by: Earnstine Regal on: 10/28/2015 02:34 PM   Modules accepted: Orders

## 2015-10-28 NOTE — Telephone Encounter (Signed)
Called [facility] inform md sent new script for Crestor 10 mg. He stated they haven't receive yet. Per chart md sent to local inform them I will resend script. Resent script to [facility]...Johny Chess

## 2015-10-28 NOTE — Telephone Encounter (Signed)
Rep left msg on triage stating receive script for Crestor 20 mg directions states to take 1/2 daily. Medication shoulf not be split in half must be taken whole. Ref# [id number]...Johny Chess

## 2015-10-28 NOTE — Telephone Encounter (Signed)
Have sent in rx for 10 mg take 1 pill daily crestor.

## 2015-11-05 ENCOUNTER — Ambulatory Visit (INDEPENDENT_AMBULATORY_CARE_PROVIDER_SITE_OTHER): Payer: Medicare Other | Admitting: Adult Health

## 2015-11-05 ENCOUNTER — Other Ambulatory Visit: Payer: Self-pay | Admitting: Adult Health

## 2015-11-05 ENCOUNTER — Telehealth: Payer: Self-pay | Admitting: Internal Medicine

## 2015-11-05 ENCOUNTER — Ambulatory Visit (INDEPENDENT_AMBULATORY_CARE_PROVIDER_SITE_OTHER)
Admission: RE | Admit: 2015-11-05 | Discharge: 2015-11-05 | Disposition: A | Discharge status: Home / Self Care | Payer: Medicare Other | Source: Ambulatory Visit | Attending: Adult Health | Admitting: Adult Health

## 2015-11-05 ENCOUNTER — Encounter: Payer: Self-pay | Admitting: Adult Health

## 2015-11-05 ENCOUNTER — Ambulatory Visit: Payer: Self-pay | Admitting: Adult Health

## 2015-11-05 VITALS — BP 120/68 | HR 86 | Temp 99.1°F | Ht 71.0 in | Wt 275.0 lb

## 2015-11-05 DIAGNOSIS — R05 Cough: Secondary | ICD-10-CM | POA: Diagnosis not present

## 2015-11-05 DIAGNOSIS — R058 Other specified cough: Secondary | ICD-10-CM

## 2015-11-05 DIAGNOSIS — J449 Chronic obstructive pulmonary disease, unspecified: Secondary | ICD-10-CM | POA: Diagnosis not present

## 2015-11-05 DIAGNOSIS — R0602 Shortness of breath: Secondary | ICD-10-CM | POA: Diagnosis not present

## 2015-11-05 LAB — BASIC METABOLIC PANEL
BUN: 9 mg/dL (ref 6–23)
CALCIUM: 9 mg/dL (ref 8.4–10.5)
CO2: 28 meq/L (ref 19–32)
CREATININE: 1.37 mg/dL (ref 0.40–1.50)
Chloride: 104 mEq/L (ref 96–112)
GFR: 66.95 mL/min (ref >60.00)
GLUCOSE: 114 mg/dL — AB (ref 70–99)
Potassium: 4 mEq/L (ref 3.5–5.1)
SODIUM: 140 meq/L (ref 135–145)

## 2015-11-05 LAB — CBC WITH DIFFERENTIAL/PLATELET
BASOS PCT: 0.2 % (ref 0.0–3.0)
Basophils Absolute: 0 10*3/uL (ref 0.0–0.1)
EOS PCT: 0.3 % (ref 0.0–5.0)
Eosinophils Absolute: 0 10*3/uL (ref 0.0–0.7)
HEMATOCRIT: 48.8 % (ref 39.0–52.0)
HEMOGLOBIN: 16.1 g/dL (ref 13.0–17.0)
Lymphocytes Relative: 22.3 % (ref 12.0–46.0)
Lymphs Abs: 1.4 10*3/uL (ref 0.7–4.0)
MCHC: 33.1 g/dL (ref 30.0–36.0)
MCV: 91.1 fl (ref 78.0–100.0)
Monocytes Absolute: 1.2 10*3/uL — ABNORMAL HIGH (ref 0.1–1.0)
Neutro Abs: 3.8 10*3/uL (ref 1.4–7.7)
Neutrophils Relative %: 59 % (ref 43.0–77.0)
Platelets: 136 10*3/uL — ABNORMAL LOW (ref 150.0–400.0)
RBC: 5.36 Mil/uL (ref 4.22–5.81)
RDW: 14.2 % (ref 11.5–15.5)
WBC: 6.5 10*3/uL (ref 4.0–10.5)

## 2015-11-05 MED ORDER — IPRATROPIUM-ALBUTEROL 0.5-2.5 (3) MG/3ML IN SOLN
3.0000 mL | Freq: Once | RESPIRATORY_TRACT | Status: AC
  Administered 2015-11-05: 3 mL via RESPIRATORY_TRACT

## 2015-11-05 MED ORDER — DOXYCYCLINE HYCLATE 100 MG PO CAPS: 100.0000 mg | ORAL_CAPSULE | Freq: Two times a day (BID) | ORAL | Status: DC

## 2015-11-05 MED ORDER — PREDNISONE 10 MG PO TABS: ORAL_TABLET | ORAL | Status: DC

## 2015-11-05 MED ORDER — HYDROCODONE-HOMATROPINE 5-1.5 MG/5ML PO SYRP
5.0000 mL | ORAL_SOLUTION | Freq: Three times a day (TID) | ORAL | Status: DC | PRN
Start: 2015-11-05 — End: 2015-12-30

## 2015-11-05 NOTE — Telephone Encounter (Signed)
Updated patient on results of blood work and chest xray

## 2015-11-05 NOTE — Patient Instructions (Addendum)
It was great meeting you today!  I would like you to get a chest xray at the [facility] office. I will follow up with you regarding your labs and chest xray.   Follow up with Dr. Sharlet Salina if needed

## 2015-11-05 NOTE — Progress Notes (Signed)
Pre visit review using our clinic review tool, if applicable. No additional management support is needed unless otherwise documented below in the visit note. 

## 2015-11-05 NOTE — Telephone Encounter (Signed)
Patient Name: Neil Carroll  DOB: Jan 25, 1951    Initial Comment Caller states has runny nose/blood in mucus nose; nasal congestion; not sleeping well; cough; heart beating fast; ache/tingle in left arm and shoulder; urg per charge;    Nurse Assessment  Nurse: Mallie Mussel, RN, Alveta Heimlich Date/Time (Eastern Time): 11/05/2015 11:08:54 AM  Confirm and document reason for call. If symptomatic, describe symptoms. You must click the next button to save text entered. ---Caller states that he has a rapid heart beat which he has dealt with for years. Denies heart beating rapidly at present. He states that he did have some tingling in his left arm and shoulder, but denies it at present. He has had some dark red mucus from his cough and nasal discharge. He has pressure in his forehead with this. He thinks he has "a little bit" of a fever. Denies difficulty breathing.  Has the patient traveled out of the country within the last 30 days? ---No  Does the patient have any new or worsening symptoms? ---Yes  Will a triage be completed? ---Yes  Related visit to physician within the last 2 weeks? ---No  Does the PT have any chronic conditions? (i.e. diabetes, asthma, etc.) ---Yes  List chronic conditions. ---Diabetes, HTN  Is this a behavioral health or substance abuse call? ---No     Guidelines    Guideline Title Affirmed Question Affirmed Notes  Cough - Acute Productive [1] Coughed up blood AND [2] > 1 tablespoon (15 ml) (Exception: blood-tinged sputum)    Final Disposition User   See Physician within 4 Hours (or PCP triage) Mallie Mussel, RN, Alveta Heimlich    Comments  No appointments available at [facility] for today. I was able to schedule him to be seen by Sallee Provencal FNP for 3:00pm today at [facility].   Disagree/Comply: Comply

## 2015-11-05 NOTE — Progress Notes (Signed)
Subjective:    Patient ID: Neil Mam., male    DOB: 1951-06-12, 65 y.o.   MRN: [id number]  HPI  65 year old male, patient of Dr. Sharlet Salina who  has a past medical history of Other peripheral vascular disease(443.89); Hyperlipidemia; Hypertension; Transient global amnesia; Contact dermatitis and other eczema due to plants (except food); Tobacco use disorder; Atrial flutter ([place]); Type II diabetes mellitus ([place]); Chronic bronchitis; GERD (gastroesophageal reflux disease); LV dysfunction; Adenomatous colon polyp; COPD (chronic obstructive pulmonary disease) ([place]); Arthritis; Cataract; and Tuberculosis.  He presents today with productive cough, congestion, and rhinorrhea since Sunday. He reports that he became more congested yesterday as the day went on, by evening time he was unable to go to sleep because of the cough and congestion. When he blew his nose he noticed blood in his mucus. Today he has had episodes of blood tinged sputum. He endorses periods of shortness of breath and wheezing. Has COPD and was using his inhalers.   Low grade fever in the office.   Continues to smoke 1.5 packs per day.    Review of Systems  Constitutional: Positive for fever, activity change and fatigue.  HENT: Positive for congestion, postnasal drip and rhinorrhea. Negative for ear discharge, ear pain, nosebleeds and sinus pressure.   Respiratory: Positive for cough, shortness of breath and wheezing.   Cardiovascular: Negative.   Neurological: Negative.   All other systems reviewed and are negative.  Past Medical History  Diagnosis Date  . Other peripheral vascular disease(443.89)     bilateral lower extremity  . Hyperlipidemia   . Hypertension   . Transient global amnesia   . Contact dermatitis and other eczema due to plants (except food)   . Tobacco use disorder     dependent  . Atrial flutter ([place])     onset  2011. s/p EPS/RFA 12/2011  . Type II diabetes mellitus ([place])   . Chronic  bronchitis   . GERD (gastroesophageal reflux disease)   . LV dysfunction     EF 45-50% 12/2011  . Adenomatous colon polyp   . COPD (chronic obstructive pulmonary disease) ([place])   . Arthritis     left hip replacement  . Cataract   . Tuberculosis     Social History   Social History  . Marital Status: Widowed    Spouse Name: N/A  . Number of Children: 5  . Years of Education: N/A   Occupational History  . security guard    Social History Main Topics  . Smoking status: Current Every Day Smoker -- 1.00 packs/day for 44 years    Types: Cigarettes  . Smokeless tobacco: Never Used     Comment: started @ age 79, up to 2 ppd;1.25-1.5 ppd as of 11/23/13  . Alcohol Use: 0.0 oz/week    0 Standard drinks or equivalent per week     Comment:  11/23/13 "2-3 drinks per year"  . Drug Use: No  . Sexual Activity: Not Currently   Other Topics Concern  . Not on file   Social History Narrative    Past Surgical History  Procedure Laterality Date  . Partial hip arthroplasty Right 01/2000    "Right; replaced ball & stem"  . Partial hip arthroplasty Left   . Cystoscopy  11/2007    Dr Jeffie Pollock  . Cardiac electrophysiology mapping and ablation  03/2010  . Tee without cardioversion  12/28/2011    Procedure: TRANSESOPHAGEAL ECHOCARDIOGRAM (TEE);  Surgeon: Larey Dresser, MD;  Location: [facility] ENDOSCOPY;  Service: Cardiovascular;  Laterality: N/A;  . Colonoscopy with polypectomy  2006 & 2011    Dr [name]  . A flutter ablation N/A 12/28/2011    Procedure: ABLATION A FLUTTER;  Surgeon: Evans Lance, MD;  Location: [facility] CATH LAB;  Service: Cardiovascular;  Laterality: N/A;  . Colonoscopy    . Polypectomy    . Cardiac catheterization N/A 07/10/2015    Procedure: Left Heart Cath and Coronary Angiography;  Surgeon: Sherren Mocha, MD;  Location: [place] CV LAB;  Service: Cardiovascular;  Laterality: N/A;    Family History  Problem Relation Age of Onset  . Stroke Mother 47  . Hypertension Mother   .  Diabetes Mother   . Lung cancer Father     smoker  . Diabetes Paternal Grandmother   . Diabetes Brother     MI @ 59  . Diabetes Brother     Hepatitis C  . Throat cancer Paternal Uncle     1/2 uncle  . Heart attack Brother 55  . Colon cancer Neg Hx   . Esophageal cancer Neg Hx   . Rectal cancer Neg Hx   . Stomach cancer Neg Hx   . Crohn's disease Son     Allergies  Allergen Reactions  . Niacin Other (See Comments)    REACTION: upset stomach    Current Outpatient Prescriptions on File Prior to Visit  Medication Sig Dispense Refill  . acetaminophen (TYLENOL) 650 MG CR tablet Take 650 mg by mouth every 8 (eight) hours as needed for pain.    [name] aspirin 325 MG tablet Take 325 mg by mouth daily.    . cilostazol (PLETAL) 100 MG tablet Take 1 tablet (100 mg total) by mouth 2 (two) times daily. 180 tablet 1  . clobetasol (OLUX) 0.05 % topical foam Apply topically 2 (two) times daily.   0  . etanercept (ENBREL) 50 MG/ML injection Inject 50 mg into the skin 2 (two) times a week. Twice a week    . Flaxseed, Linseed, (FLAX SEED OIL) 1000 MG CAPS Take 1 capsule by mouth 2 (two) times daily.     . Fluticasone-Salmeterol (ADVAIR DISKUS) 100-50 MCG/DOSE AEPB inhale 1 dose by mouth twice a day (Patient taking differently: Inhale 1 puff into the lungs 2 (two) times daily. inhale 1 dose by mouth twice a day) 180 each 2  . furosemide (LASIX) 40 MG tablet Take 1 tablet (40 mg total) by mouth daily. 90 tablet 3  . hydrochlorothiazide (HYDRODIURIL) 25 MG tablet take 1 tablet by mouth once daily 90 tablet 3  . Insulin Detemir (LEVEMIR FLEXTOUCH) 100 UNIT/ML Pen INJECT 24 UNITS INTO THE   SKIN DAILY 30 mL 1  . Insulin Pen Needle (NOVOFINE) 32G X 6 MM MISC Use 2 needles daily with Levimir pen and Victoza pen  DX: E11.69 100 each 5  . losartan (COZAAR) 100 MG tablet take 1 tablet by mouth once daily 30 tablet 10  . metformin (FORTAMET) 1000 MG (OSM) 24 hr tablet TAKE 1 TABLET TWICE A DAY 180 tablet 1  .  metoprolol (LOPRESSOR) 100 MG tablet take 1 tablet by mouth twice a day 180 tablet 3  . mupirocin ointment (BACTROBAN) 2 % Apply 1 application topically 2 (two) times daily as needed (affected areas).     . Omega-3 Fatty Acids (FISH OIL) 1000 MG CAPS Take 2 capsules by mouth 2 (two) times daily.     . rosuvastatin (CRESTOR) 10 MG tablet Take  1 tablet (10 mg total) by mouth daily. 90 tablet 3  . tamsulosin (FLOMAX) 0.4 MG CAPS capsule Take 0.4 mg by mouth daily.     [name] triamcinolone cream (KENALOG) 0.1 % Apply 1 application topically 2 (two) times daily as needed (affected area).     [name] VICTOZA 18 MG/3ML SOPN INJECT 0.3ML (=1.'8MG'$ )      SUBCUTANEOUSLY DAILY 27 mL 1   No current facility-administered medications on file prior to visit.    BP 120/68 mmHg  Pulse 86  Temp(Src) 99.1 F (37.3 C) (Oral)  Ht '5\' 11"'$  (1.803 m)  Wt 275 lb (124.739 kg)  BMI 38.37 kg/m2  SpO2 93%       Objective:   Physical Exam  Constitutional: He is oriented to person, place, and time. He appears well-developed and well-nourished. No distress.  HENT:  Head: Normocephalic and atraumatic.  Right Ear: External ear normal.  Left Ear: External ear normal.  Nose: Nose normal.  Mouth/Throat: Oropharynx is clear and moist. No oropharyngeal exudate.  Neck: Normal range of motion. Neck supple.  Cardiovascular: Normal rate, regular rhythm, normal heart sounds and intact distal pulses.  Exam reveals no gallop and no friction rub.   No murmur heard. Pulmonary/Chest: Effort normal. He has wheezes in the right upper field, the right middle field, the right lower field, the left upper field, the left middle field and the left lower field. He has rhonchi in the right upper field, the right middle field, the right lower field, the left upper field, the left middle field and the left lower field. He has no rales. He exhibits no tenderness.  Musculoskeletal: Normal range of motion. He exhibits no edema or tenderness.    Lymphadenopathy:    He has no cervical adenopathy.  Neurological: He is alert and oriented to person, place, and time.  Skin: Skin is warm and dry. No rash noted. He is not diaphoretic. No erythema. No pallor.  Psychiatric: He has a normal mood and affect. His behavior is normal. Judgment and thought content normal.  Nursing note and vitals reviewed.      Assessment & Plan:  1. Productive cough - Prior to neb, patients oxygen saturation was 84-85%, he reports that he usually runs in the low 90's. After nebulizer treatment his oxygen saturation bounced around 90's-95% - DG Chest 2 View; Future - CBC with Differential/Platelet - Basic metabolic panel - ipratropium-albuterol (DUONEB) 0.5-2.5 (3) MG/3ML nebulizer solution 3 mL; Take 3 mLs by nebulization once. - Need to r/o pneumonia vs COPD flare.  - Advised to quit smoking - Will treat once X ray is back.

## 2015-11-06 ENCOUNTER — Ambulatory Visit: Payer: Medicare Other | Admitting: Internal Medicine

## 2015-11-10 ENCOUNTER — Inpatient Hospital Stay (HOSPITAL_COMMUNITY)
Admission: EM | Admit: 2015-11-10 | Discharge: 2015-11-12 | DRG: 190 | Disposition: A | Discharge status: Home / Self Care | Payer: Medicare Other | Attending: Internal Medicine | Admitting: Internal Medicine

## 2015-11-10 ENCOUNTER — Emergency Department (HOSPITAL_COMMUNITY): Payer: Medicare Other

## 2015-11-10 ENCOUNTER — Encounter (HOSPITAL_COMMUNITY): Payer: Self-pay | Admitting: *Deleted

## 2015-11-10 DIAGNOSIS — J9601 Acute respiratory failure with hypoxia: Secondary | ICD-10-CM | POA: Diagnosis present

## 2015-11-10 DIAGNOSIS — I272 Other secondary pulmonary hypertension: Secondary | ICD-10-CM | POA: Diagnosis not present

## 2015-11-10 DIAGNOSIS — I2510 Atherosclerotic heart disease of native coronary artery without angina pectoris: Secondary | ICD-10-CM | POA: Diagnosis present

## 2015-11-10 DIAGNOSIS — I4892 Unspecified atrial flutter: Secondary | ICD-10-CM | POA: Diagnosis present

## 2015-11-10 DIAGNOSIS — E785 Hyperlipidemia, unspecified: Secondary | ICD-10-CM | POA: Diagnosis present

## 2015-11-10 DIAGNOSIS — E1169 Type 2 diabetes mellitus with other specified complication: Secondary | ICD-10-CM | POA: Diagnosis present

## 2015-11-10 DIAGNOSIS — I25709 Atherosclerosis of coronary artery bypass graft(s), unspecified, with unspecified angina pectoris: Secondary | ICD-10-CM | POA: Diagnosis present

## 2015-11-10 DIAGNOSIS — F17210 Nicotine dependence, cigarettes, uncomplicated: Secondary | ICD-10-CM | POA: Diagnosis not present

## 2015-11-10 DIAGNOSIS — I739 Peripheral vascular disease, unspecified: Secondary | ICD-10-CM | POA: Diagnosis present

## 2015-11-10 DIAGNOSIS — I519 Heart disease, unspecified: Secondary | ICD-10-CM | POA: Diagnosis present

## 2015-11-10 DIAGNOSIS — R0902 Hypoxemia: Secondary | ICD-10-CM

## 2015-11-10 DIAGNOSIS — J849 Interstitial pulmonary disease, unspecified: Secondary | ICD-10-CM | POA: Diagnosis present

## 2015-11-10 DIAGNOSIS — E1159 Type 2 diabetes mellitus with other circulatory complications: Secondary | ICD-10-CM | POA: Diagnosis present

## 2015-11-10 DIAGNOSIS — E1151 Type 2 diabetes mellitus with diabetic peripheral angiopathy without gangrene: Secondary | ICD-10-CM | POA: Diagnosis not present

## 2015-11-10 DIAGNOSIS — Z794 Long term (current) use of insulin: Secondary | ICD-10-CM

## 2015-11-10 DIAGNOSIS — J441 Chronic obstructive pulmonary disease with (acute) exacerbation: Secondary | ICD-10-CM | POA: Diagnosis not present

## 2015-11-10 DIAGNOSIS — J449 Chronic obstructive pulmonary disease, unspecified: Secondary | ICD-10-CM | POA: Diagnosis not present

## 2015-11-10 DIAGNOSIS — T380X5A Adverse effect of glucocorticoids and synthetic analogues, initial encounter: Secondary | ICD-10-CM | POA: Diagnosis present

## 2015-11-10 DIAGNOSIS — I110 Hypertensive heart disease with heart failure: Secondary | ICD-10-CM | POA: Diagnosis present

## 2015-11-10 DIAGNOSIS — D696 Thrombocytopenia, unspecified: Secondary | ICD-10-CM | POA: Diagnosis not present

## 2015-11-10 DIAGNOSIS — L409 Psoriasis, unspecified: Secondary | ICD-10-CM | POA: Diagnosis present

## 2015-11-10 DIAGNOSIS — F17200 Nicotine dependence, unspecified, uncomplicated: Secondary | ICD-10-CM | POA: Diagnosis present

## 2015-11-10 DIAGNOSIS — I5022 Chronic systolic (congestive) heart failure: Secondary | ICD-10-CM | POA: Diagnosis present

## 2015-11-10 LAB — BASIC METABOLIC PANEL
ANION GAP: 13 (ref 5–15)
BUN: 11 mg/dL (ref 6–20)
CALCIUM: 8.8 mg/dL — AB (ref 8.9–10.3)
CO2: 20 mmol/L — ABNORMAL LOW (ref 22–32)
CREATININE: 1.13 mg/dL (ref 0.61–1.24)
Chloride: 102 mmol/L (ref 101–111)
Glucose, Bld: 122 mg/dL — ABNORMAL HIGH (ref 65–99)
Potassium: 3.9 mmol/L (ref 3.5–5.1)
SODIUM: 135 mmol/L (ref 135–145)

## 2015-11-10 LAB — CBC WITH DIFFERENTIAL/PLATELET
BASOS ABS: 0 10*3/uL (ref 0.0–0.1)
Basophils Relative: 0 %
EOS ABS: 0 10*3/uL (ref 0.0–0.7)
Eosinophils Relative: 0 %
HCT: 49 % (ref 39.0–52.0)
Hemoglobin: 16.6 g/dL (ref 13.0–17.0)
LYMPHS ABS: 2.4 10*3/uL (ref 0.7–4.0)
Lymphocytes Relative: 29 %
MCH: 30.9 pg (ref 26.0–34.0)
MCHC: 33.9 g/dL (ref 30.0–36.0)
MCV: 91.1 fL (ref 78.0–100.0)
MONO ABS: 1 10*3/uL (ref 0.1–1.0)
Monocytes Relative: 12 %
NEUTROS PCT: 59 %
Neutro Abs: 5 10*3/uL (ref 1.7–7.7)
PLATELETS: 129 10*3/uL — AB (ref 150–400)
RBC: 5.38 MIL/uL (ref 4.22–5.81)
RDW: 13.5 % (ref 11.5–15.5)
WBC: 8.4 10*3/uL (ref 4.0–10.5)

## 2015-11-10 LAB — BRAIN NATRIURETIC PEPTIDE: B NATRIURETIC PEPTIDE 5: 52.1 pg/mL (ref 0.0–100.0)

## 2015-11-10 LAB — GLUCOSE, CAPILLARY: Glucose-Capillary: 247 mg/dL — ABNORMAL HIGH (ref 65–99)

## 2015-11-10 LAB — CBG MONITORING, ED: GLUCOSE-CAPILLARY: 255 mg/dL — AB (ref 65–99)

## 2015-11-10 MED ORDER — ONDANSETRON HCL 4 MG PO TABS: 4.0000 mg | ORAL_TABLET | Freq: Four times a day (QID) | ORAL | Status: DC | PRN

## 2015-11-10 MED ORDER — INSULIN DETEMIR 100 UNIT/ML ~~LOC~~ SOLN
24.0000 [IU] | Freq: Every day | SUBCUTANEOUS | Status: DC
  Administered 2015-11-10: 24 [IU] via SUBCUTANEOUS
  Filled 2015-11-10 (×2): qty 0.24

## 2015-11-10 MED ORDER — FUROSEMIDE 40 MG PO TABS
40.0000 mg | ORAL_TABLET | Freq: Every day | ORAL | Status: DC
  Administered 2015-11-11 – 2015-11-12 (×2): 40 mg via ORAL
  Filled 2015-11-10 (×2): qty 1

## 2015-11-10 MED ORDER — IPRATROPIUM-ALBUTEROL 0.5-2.5 (3) MG/3ML IN SOLN: 3.0000 mL | Freq: Four times a day (QID) | RESPIRATORY_TRACT | Status: DC

## 2015-11-10 MED ORDER — METHYLPREDNISOLONE SODIUM SUCC 125 MG IJ SOLR
125.0000 mg | Freq: Once | INTRAMUSCULAR | Status: AC
  Administered 2015-11-10: 125 mg via INTRAVENOUS
  Filled 2015-11-10: qty 2

## 2015-11-10 MED ORDER — FLAX SEED OIL 1000 MG PO CAPS: 1.0000 | ORAL_CAPSULE | Freq: Two times a day (BID) | ORAL | Status: DC

## 2015-11-10 MED ORDER — ONDANSETRON HCL 4 MG/2ML IJ SOLN: 4.0000 mg | Freq: Four times a day (QID) | INTRAMUSCULAR | Status: DC | PRN

## 2015-11-10 MED ORDER — ENOXAPARIN SODIUM 40 MG/0.4ML ~~LOC~~ SOLN
40.0000 mg | SUBCUTANEOUS | Status: DC
  Administered 2015-11-10 – 2015-11-11 (×2): 40 mg via SUBCUTANEOUS
  Filled 2015-11-10 (×2): qty 0.4

## 2015-11-10 MED ORDER — INSULIN ASPART 100 UNIT/ML ~~LOC~~ SOLN
0.0000 [IU] | Freq: Every day | SUBCUTANEOUS | Status: DC
  Administered 2015-11-10 – 2015-11-11 (×2): 2 [IU] via SUBCUTANEOUS

## 2015-11-10 MED ORDER — LOSARTAN POTASSIUM 50 MG PO TABS
100.0000 mg | ORAL_TABLET | Freq: Every day | ORAL | Status: DC
  Administered 2015-11-11 – 2015-11-12 (×2): 100 mg via ORAL
  Filled 2015-11-10 (×2): qty 2

## 2015-11-10 MED ORDER — BENZONATATE 100 MG PO CAPS
100.0000 mg | ORAL_CAPSULE | Freq: Three times a day (TID) | ORAL | Status: DC
  Administered 2015-11-10 – 2015-11-12 (×6): 100 mg via ORAL
  Filled 2015-11-10 (×6): qty 1

## 2015-11-10 MED ORDER — LIRAGLUTIDE 18 MG/3ML ~~LOC~~ SOPN
1.8000 mg | PEN_INJECTOR | Freq: Every day | SUBCUTANEOUS | Status: DC
  Administered 2015-11-12: 1.8 mg via INTRAMUSCULAR

## 2015-11-10 MED ORDER — METHYLPREDNISOLONE SODIUM SUCC 125 MG IJ SOLR
60.0000 mg | Freq: Four times a day (QID) | INTRAMUSCULAR | Status: DC
  Administered 2015-11-10 – 2015-11-12 (×7): 60 mg via INTRAVENOUS
  Filled 2015-11-10 (×7): qty 2

## 2015-11-10 MED ORDER — INSULIN DETEMIR 100 UNIT/ML FLEXPEN: 24.0000 [IU] | PEN_INJECTOR | Freq: Every day | SUBCUTANEOUS | Status: DC

## 2015-11-10 MED ORDER — ACETAMINOPHEN 325 MG PO TABS: 650.0000 mg | ORAL_TABLET | Freq: Four times a day (QID) | ORAL | Status: DC | PRN

## 2015-11-10 MED ORDER — TAMSULOSIN HCL 0.4 MG PO CAPS
0.4000 mg | ORAL_CAPSULE | Freq: Every day | ORAL | Status: DC
  Administered 2015-11-11 – 2015-11-12 (×2): 0.4 mg via ORAL
  Filled 2015-11-10 (×2): qty 1

## 2015-11-10 MED ORDER — ASPIRIN 325 MG PO TABS
325.0000 mg | ORAL_TABLET | Freq: Every day | ORAL | Status: DC
  Administered 2015-11-11 – 2015-11-12 (×2): 325 mg via ORAL
  Filled 2015-11-10 (×2): qty 1

## 2015-11-10 MED ORDER — LEVALBUTEROL HCL 0.63 MG/3ML IN NEBU
0.6300 mg | INHALATION_SOLUTION | Freq: Three times a day (TID) | RESPIRATORY_TRACT | Status: DC
  Administered 2015-11-10 (×2): 0.63 mg via RESPIRATORY_TRACT
  Filled 2015-11-10 (×3): qty 3

## 2015-11-10 MED ORDER — METHYLPREDNISOLONE SODIUM SUCC 125 MG IJ SOLR: 60.0000 mg | Freq: Four times a day (QID) | INTRAMUSCULAR | Status: DC

## 2015-11-10 MED ORDER — CENTRUM ADULTS PO TABS: 1.0000 | ORAL_TABLET | Freq: Every day | ORAL | Status: DC

## 2015-11-10 MED ORDER — DOXYCYCLINE HYCLATE 100 MG PO TABS
100.0000 mg | ORAL_TABLET | Freq: Two times a day (BID) | ORAL | Status: DC
  Administered 2015-11-10 (×2): 100 mg via ORAL
  Filled 2015-11-10 (×2): qty 1

## 2015-11-10 MED ORDER — ACETAMINOPHEN 650 MG RE SUPP: 650.0000 mg | Freq: Four times a day (QID) | RECTAL | Status: DC | PRN

## 2015-11-10 MED ORDER — ROSUVASTATIN CALCIUM 10 MG PO TABS
10.0000 mg | ORAL_TABLET | Freq: Every day | ORAL | Status: DC
  Administered 2015-11-11 – 2015-11-12 (×2): 10 mg via ORAL
  Filled 2015-11-10 (×2): qty 1

## 2015-11-10 MED ORDER — INSULIN ASPART 100 UNIT/ML ~~LOC~~ SOLN
0.0000 [IU] | Freq: Three times a day (TID) | SUBCUTANEOUS | Status: DC
  Administered 2015-11-10: 8 [IU] via SUBCUTANEOUS
  Administered 2015-11-11 (×2): 2 [IU] via SUBCUTANEOUS
  Administered 2015-11-11: 3 [IU] via SUBCUTANEOUS
  Administered 2015-11-12: 8 [IU] via SUBCUTANEOUS
  Filled 2015-11-10: qty 1

## 2015-11-10 MED ORDER — IPRATROPIUM-ALBUTEROL 0.5-2.5 (3) MG/3ML IN SOLN
3.0000 mL | Freq: Once | RESPIRATORY_TRACT | Status: AC
  Administered 2015-11-10: 3 mL via RESPIRATORY_TRACT
  Filled 2015-11-10: qty 3

## 2015-11-10 MED ORDER — HYDROCHLOROTHIAZIDE 25 MG PO TABS
25.0000 mg | ORAL_TABLET | Freq: Every day | ORAL | Status: DC
  Administered 2015-11-11 – 2015-11-12 (×2): 25 mg via ORAL
  Filled 2015-11-10 (×2): qty 1

## 2015-11-10 MED ORDER — METOPROLOL TARTRATE 100 MG PO TABS
100.0000 mg | ORAL_TABLET | Freq: Two times a day (BID) | ORAL | Status: DC
  Administered 2015-11-10 – 2015-11-12 (×4): 100 mg via ORAL
  Filled 2015-11-10: qty 1
  Filled 2015-11-10: qty 2
  Filled 2015-11-10 (×2): qty 1

## 2015-11-10 MED ORDER — LOSARTAN POTASSIUM 50 MG PO TABS: 100.0000 mg | ORAL_TABLET | Freq: Every day | ORAL | Status: DC

## 2015-11-10 MED ORDER — CILOSTAZOL 100 MG PO TABS
100.0000 mg | ORAL_TABLET | Freq: Two times a day (BID) | ORAL | Status: DC
  Administered 2015-11-10 – 2015-11-12 (×4): 100 mg via ORAL
  Filled 2015-11-10 (×4): qty 1

## 2015-11-10 MED ORDER — CETYLPYRIDINIUM CHLORIDE 0.05 % MT LIQD
7.0000 mL | Freq: Two times a day (BID) | OROMUCOSAL | Status: DC
  Administered 2015-11-10 – 2015-11-11 (×3): 7 mL via OROMUCOSAL

## 2015-11-10 MED ORDER — ADULT MULTIVITAMIN W/MINERALS CH
1.0000 | ORAL_TABLET | Freq: Every day | ORAL | Status: DC
  Administered 2015-11-11 – 2015-11-12 (×2): 1 via ORAL
  Filled 2015-11-10 (×2): qty 1

## 2015-11-10 MED ORDER — OMEGA-3-ACID ETHYL ESTERS 1 G PO CAPS
1.0000 g | ORAL_CAPSULE | Freq: Every day | ORAL | Status: DC
  Administered 2015-11-11 – 2015-11-12 (×2): 1 g via ORAL
  Filled 2015-11-10 (×2): qty 1

## 2015-11-10 NOTE — H&P (Signed)
[facility] History and Physical                                                                                    Neil Carroll, is a 65 y.o. male  MRN: [id number]   DOB - [birth year]  Admit Date - 11/10/2015  Outpatient Primary MD for the patient is Hoyt Koch, MD  Referring MD: Rex Kras / ER  PMH: Past Medical History  Diagnosis Date  . Other peripheral vascular disease(443.89)     bilateral lower extremity  . Hyperlipidemia   . Hypertension   . Transient global amnesia   . Contact dermatitis and other eczema due to plants (except food)   . Tobacco use disorder     dependent  . Atrial flutter ([place])     onset  2011. s/p EPS/RFA 12/2011  . Type II diabetes mellitus ([place])   . Chronic bronchitis   . GERD (gastroesophageal reflux disease)   . LV dysfunction     EF 45-50% 12/2011  . Adenomatous colon polyp   . COPD (chronic obstructive pulmonary disease) ([place])   . Arthritis     left hip replacement  . Cataract   . Tuberculosis       PSH: Past Surgical History  Procedure Laterality Date  . Partial hip arthroplasty Right 01/2000    "Right; replaced ball & stem"  . Partial hip arthroplasty Left   . Cystoscopy  11/2007    Dr Jeffie Pollock  . Cardiac electrophysiology mapping and ablation  03/2010  . Tee without cardioversion  12/28/2011    Procedure: TRANSESOPHAGEAL ECHOCARDIOGRAM (TEE);  Surgeon: Larey Dresser, MD;  Location: [facility];  Service: Cardiovascular;  Laterality: N/A;  . Colonoscopy with polypectomy  2006 & 2011    Dr [name]  . A flutter ablation N/A 12/28/2011    Procedure: ABLATION A FLUTTER;  Surgeon: Evans Lance, MD;  Location[facility] CATH LAB;  Service: Cardiovascular;  Laterality: N/A;  . Colonoscopy    . Polypectomy    . Cardiac catheterization N/A 07/10/2015    Procedure: Left Heart Cath and Coronary Angiography;  Surgeon: Sherren Mocha, MD;  Location: [facility] CV LAB;  Service: Cardiovascular;  Laterality: N/A;     CC:  Chief  Complaint  Patient presents with  . Shortness of Breath     HPI: 65 year old male patient with history of ongoing tobacco abuse and associated COPD/ILD, CAD with recent catheterization October 2016 currently being medically managed, atrial flutter maintaining sinus rhythm post-ablation, diabetes on insulin, peripheral vascular disease, dyslipidemia, psoriasis on Enbrel who presents to the ER with anxiety and difficulty sleeping while laying flat. Patient reports that on 2/14 he went to his doctor to seek treatment for a few days of upper respiratory symptoms including significant nasal congestion and cough. He was started on prednisone taper and doxycycline. For the past 2 nights he's had difficulty sleeping. He denies actual difficulty breathing but states he is unable to lay flat because of anxiousness and restlessness. Patient does not have albuterol rescue inhaler at home; because of his underlying atrial flutter his cardiologist stopped that medication. He does use his Advair regularly. He continues  to smoke. He has not had any fevers, nausea, vomiting, nasal congestion has improved after outpatient therapies as described above.  ER Evaluation and treatment: Temperature 98.3, BP 160/96, pulse 84 and regular, room air saturations were 92% but patient with increased work of breathing so oxygen was applied and patient has had decreased work of breathing. EKG: Sinus rhythm with ventricular rate 74 bpm, QTC 451 ms, no ischemic changes. 2 View CXR: No acute abnormality, stable COPD with interstitial fibrosis Laboratory data: Na 135, K 4.0, CO2 20, BUN 11, Cr on 0.13, glucose 122, BNP 52, WBCs 8400, hemoglobin 16.6, platelets 129,000 Solu-Medrol 125 mg IV 1 DuoNeb treatment 1  Review of Systems   In addition to the HPI above,  No Fever-chills, myalgias or other constitutional symptoms No Headache, changes with Vision or hearing, new weakness, tingling, numbness in any extremity, No problems  swallowing food or Liquids, indigestion/reflux No Chest pain, palpitations, or DOE No Abdominal pain, N/V; no melena or hematochezia, no dark tarry stools, Bowel movements are regular, No dysuria, hematuria or flank pain No new skin rashes, lesions, masses or bruises, No new joints pains-aches No recent weight gain or loss No polyuria, polydypsia or polyphagia,  *A full 10 point Review of Systems was done, except as stated above, all other Review of Systems were negative.  Social History Social History  Substance Use Topics  . Smoking status: Current Every Day Smoker -- 1.00 packs/day for 44 years    Types: Cigarettes  . Smokeless tobacco: Never Used     Comment: started @ age 77, up to 2 ppd;1.25-1.5 ppd as of 11/23/13  . Alcohol Use: 0.0 oz/week    0 Standard drinks or equivalent per week     Comment:  11/23/13 "2-3 drinks per year"    Resides at: Private residence  Lives with: Alone  Ambulatory status: Without assistive devices   Family History Family History  Problem Relation Age of Onset  . Stroke Mother 6  . Hypertension Mother   . Diabetes Mother   . Lung cancer Father     smoker  . Diabetes Paternal Grandmother   . Diabetes Brother     MI @ 58  . Diabetes Brother     Hepatitis C  . Throat cancer Paternal Uncle     1/2 uncle  . Heart attack Brother 60  . Colon cancer Neg Hx   . Esophageal cancer Neg Hx   . Rectal cancer Neg Hx   . Stomach cancer Neg Hx   . Crohn's disease Son      Prior to Admission medications   Medication Sig Start Date End Date Taking? Authorizing Provider  acetaminophen (TYLENOL) 650 MG CR tablet Take 650 mg by mouth every 8 (eight) hours as needed for pain.   Yes Historical Provider, MD  aspirin 325 MG tablet Take 325 mg by mouth daily.   Yes Historical Provider, MD  cilostazol (PLETAL) 100 MG tablet Take 1 tablet (100 mg total) by mouth 2 (two) times daily. 10/25/15  Yes Hoyt Koch, MD  clobetasol (OLUX) 0.05 % topical foam  Apply topically 2 (two) times daily.  12/27/14  Yes Historical Provider, MD  doxycycline (VIBRAMYCIN) 100 MG capsule Take 1 capsule (100 mg total) by mouth 2 (two) times daily. 11/05/15  Yes Dorothyann Peng, NP  etanercept (ENBREL) 50 MG/ML injection Inject 50 mg into the skin 2 (two) times a week. Twice a week   Yes Historical Provider, MD  Flaxseed, Linseed, (FLAX  SEED OIL) 1000 MG CAPS Take 1 capsule by mouth 2 (two) times daily.    Yes Historical Provider, MD  Fluticasone-Salmeterol (ADVAIR DISKUS) 100-50 MCG/DOSE AEPB inhale 1 dose by mouth twice a day Patient taking differently: Inhale 1 puff into the lungs 2 (two) times daily. inhale 1 dose by mouth twice a day 04/25/15  Yes Hendricks Limes, MD  furosemide (LASIX) 40 MG tablet Take 1 tablet (40 mg total) by mouth daily. 09/30/15  Yes Hoyt Koch, MD  hydrochlorothiazide (HYDRODIURIL) 25 MG tablet take 1 tablet by mouth once daily 08/21/15  Yes Sherren Mocha, MD  Insulin Detemir (LEVEMIR FLEXTOUCH) 100 UNIT/ML Pen INJECT 24 UNITS INTO THE   SKIN DAILY 07/24/15  Yes Elayne Snare, MD  Insulin Pen Needle (NOVOFINE) 32G X 6 MM MISC Use 2 needles daily with Levimir pen and Victoza pen  DX: E11.69 05/17/15  Yes Elayne Snare, MD  losartan (COZAAR) 100 MG tablet take 1 tablet by mouth once daily 07/24/15  Yes Sherren Mocha, MD  metformin (FORTAMET) 1000 MG (OSM) 24 hr tablet TAKE 1 TABLET TWICE A DAY 09/24/15  Yes Elayne Snare, MD  metoprolol (LOPRESSOR) 100 MG tablet take 1 tablet by mouth twice a day 08/21/15  Yes Sherren Mocha, MD  Multiple Vitamins-Minerals (CENTRUM ADULTS) TABS Take 1 tablet by mouth daily.   Yes Historical Provider, MD  mupirocin ointment (BACTROBAN) 2 % Apply 1 application topically 2 (two) times daily as needed (affected areas).    Yes Historical Provider, MD  Omega-3 Fatty Acids (FISH OIL) 1000 MG CAPS Take 2 capsules by mouth 2 (two) times daily.    Yes Historical Provider, MD  predniSONE (DELTASONE) 10 MG tablet 30 MG X 3 DAYS, 20  MG X 3 DAYS, 10 MG X 3 DAYS. 11/05/15  Yes Dorothyann Peng, NP  rosuvastatin (CRESTOR) 10 MG tablet Take 1 tablet (10 mg total) by mouth daily. 10/28/15  Yes Hoyt Koch, MD  tamsulosin (FLOMAX) 0.4 MG CAPS capsule Take 0.4 mg by mouth daily.    Yes Historical Provider, MD  triamcinolone cream (KENALOG) 0.1 % Apply 1 application topically 2 (two) times daily as needed (affected area).    Yes Historical Provider, MD  VICTOZA 18 MG/3ML SOPN INJECT 0.3ML (=1.'8MG'$ )      SUBCUTANEOUSLY DAILY 10/23/15  Yes Elayne Snare, MD  HYDROcodone-homatropine [facility]) 5-1.5 MG/5ML syrup Take 5 mLs by mouth every 8 (eight) hours as needed for cough. Patient not taking: Reported on 11/10/2015 11/05/15   Dorothyann Peng, NP    Allergies  Allergen Reactions  . Niacin Nausea Only    REACTION: upset stomach    Physical Exam  Vitals  Blood pressure 159/87, pulse 84, temperature 98 F (36.7 C), temperature source Oral, resp. rate 21, height '5\' 11"'$  (1.803 m), weight 267 lb (121.11 kg), SpO2 96 %.   General:  In no acute distress, appears healthy and well nourished-seated on side of stretcher upright leaning over psych table stating this helps him breathe better  Psych:  Normal affect, Denies Suicidal or Homicidal ideations, Awake Alert, Oriented X 3. Speech and thought patterns are clear and appropriate, no apparent short term memory deficits  Neuro:   No focal neurological deficits, CN II through XII intact, Strength 5/5 all 4 extremities, Sensation intact all 4 extremities.  ENT:  Ears and Eyes appear Normal, Conjunctivae clear, PER. Moist oral mucosa without erythema or exudates.  Neck:  Supple, No lymphadenopathy appreciated  Respiratory:  Symmetrical chest wall movement, decreased  air movement bilaterally, expiratory crackles in the bases. 2 L  Cardiac:  RRR, No Murmurs, no LE edema noted, no JVD, No carotid bruits, peripheral pulses palpable at 2+  Abdomen:  Positive bowel sounds, Soft, Non tender, Non  distended,  No masses appreciated, no obvious hepatosplenomegaly  Skin:  No Cyanosis, Normal Skin Turgor, No Skin Rash or Bruise.  Extremities: Symmetrical without obvious trauma or injury,  no effusions.  Data Review  CBC  Recent Labs Lab 11/05/15 1518 11/10/15 0920  WBC 6.5 8.4  HGB 16.1 16.6  HCT 48.8 49.0  PLT 136.0* 129*  MCV 91.1 91.1  MCH  --  30.9  MCHC 33.1 33.9  RDW 14.2 13.5  LYMPHSABS 1.4 2.4  MONOABS 1.2* 1.0  EOSABS 0.0 0.0  BASOSABS 0.0 0.0    Chemistries   Recent Labs Lab 11/05/15 1518 11/10/15 0920  NA 140 135  K 4.0 3.9  CL 104 102  CO2 28 20*  GLUCOSE 114* 122*  BUN 9 11  CREATININE 1.37 1.13  CALCIUM 9.0 8.8*    estimated creatinine clearance is 86.3 mL/min (by C-G formula based on Cr of 1.13).  No results for input(s): TSH, T4TOTAL, T3FREE, THYROIDAB in the last 72 hours.  Invalid input(s): FREET3  Coagulation profile No results for input(s): INR, PROTIME in the last 168 hours.  No results for input(s): DDIMER in the last 72 hours.  Cardiac Enzymes No results for input(s): CKMB, TROPONINI, MYOGLOBIN in the last 168 hours.  Invalid input(s): CK  Invalid input(s): POCBNP  Urinalysis    Component Value Date/Time   COLORURINE YELLOW 01/31/2015 [place] 01/31/2015 1025   LABSPEC 1.010 01/31/2015 1025   PHURINE 6.0 01/31/2015 1025   GLUCOSEU NEGATIVE 01/31/2015 1025   HGBUR NEGATIVE 01/31/2015 1025   [place] 01/31/2015 1025   [place] 01/31/2015 1025   UROBILINOGEN 0.2 01/31/2015 1025   NITRITE NEGATIVE 01/31/2015 1025   LEUKOCYTESUR NEGATIVE 01/31/2015 1025    Imaging results:   Dg Chest 2 View  11/10/2015  CLINICAL DATA:  Hypoxia.  Smoker. EXAM: CHEST  2 VIEW COMPARISON:  11/05/2015. FINDINGS: Borderline enlarged cardiac silhouette with a mild decrease in size. No significant change in diffuse prominence of the interstitial markings and right lateral scarring. The hemidiaphragms  remain flattened. Moderate right AC joint degenerative changes. IMPRESSION: 1. No acute abnormality. 2. Stable changes of COPD with interstitial fibrosis. Electronically Signed   By: Claudie Revering M.D.   On: 11/10/2015 10:02   Dg Chest 2 View  11/05/2015  CLINICAL DATA:  Cough and congestion and shortness of breath for 2 days. Diabetes. COPD. Heart palpitation. EXAM: CHEST  2 VIEW COMPARISON:  01/29/2015. FINDINGS: Cardiomegaly. Calcified tortuous aorta. Chronic changes throughout both lung fields, interstitial thickening, worse on the RIGHT, With stable previously noted pleural-based density in the RIGHT upper lobe related to scarring. No active infiltrates or overt failure. No effusion or pneumothorax. No osseous findings. IMPRESSION: Chronic changes as described. Cardiomegaly. No active infiltrates or failure. Electronically Signed   By: Staci Righter M.D.   On: 11/05/2015 16:19     EKG: (Independently reviewed)  Sinus rhythm with ventricular rate 74 bpm, QTC 451 ms, no ischemic changes.   Assessment & Plan  Principal Problem:    Acute respiratory failure with hypoxia /COPD exacerbation/  Interstitial lung disease  -Failed outpatient therapy with antibiotics and oral steroids and suspect has a degree of acute bronchitis-no focal infiltrate on chest x-ray -Medical floor/Obs -Continue  doxycycline -Change prednisone to Solu-Medrol and taper accordingly -Xopenex nebs in setting of history of atrial flutter; have asked case manager to check on co-pay and/or preauthorization for Xopenex MDI since cardiologist told patient to stop using albuterol for rescue inhaler -Supportive care with oxygen and antitussive agents  Active Problems:   Chronic mild systolic dysfunction/pulmonary hypertension -Last echocardiogram was in 2013 and demonstrated mild systolic function with EF 45-50%, mild LVH, mild to moderate pulmonary hypertension with mild mitral regurgitation-EF had improved to normal ranges by  cardiac catheterization performed in October 2016 -Currently clinically compensated without evidence of heart failure      Type 2 diabetes mellitus with vascular disease -Currently appears controlled but monitor with initiation of IV Solu-Medrol -At home takes metformin, Lantus as well as Victoza (hold metformin during acute illness) -SSI -Hemoglobin A1c -Followed by Dr. Dwyane Dee as an outpatient    HTN  -Moderately controlled -Continue preadmission Lasix, hydrochlorothiazide (has issues with recurrent lower extremity edema ), and Cozaar as well as Lopressor    Atrial flutter maintaining NSR post ablation -Remains in sinus rhythm on beta blocker -Not on anticoagulation since has been maintaining sinus rhythm post-ablation-followed by cardiology as an outpatient -CHADVASc = 4    CAD  -Currently asymptomatic -Cardiac catheterization October 2016 with severe distal LCFx stenosis recommended medical therapy, mild nonobstructive stenosis of large wraparound LAD and total occlusion small codominant RCA -Continue beta blocker and statin as well as aspirin    Thrombocytopenia  -Seems to be a progressive problem beginning October 2016 -Unclear etiology recommend continue to follow platelets -Not have associated anemia -May be medication related (? Enbrel, Lasix or Pletal)    Hyperlipidemia -Continue statin    Smoker -Tobacco cessation counseling at discharge    PVD with claudication  -Continue Pletal    Psoriasis -Continue Enbrel    DVT Prophylaxis: Lovenox  Family Communication:   No family at bedside  Code Status:  Full code  Condition:  Stable  Discharge disposition: Anticipate discharge back to previous home environment wants COPD exacerbation symptoms better controlled  Time spent in minutes : 60      Lucy Boardman L. ANP on 11/10/2015 at 12:58 PM  You may contact me by going to [email or web address] - password [email or web address]  I am available from 7a-7p but please confirm I am on  the schedule by going to Amion as above.   After 7p please contact night coverage person covering me after hours  [facility]

## 2015-11-10 NOTE — ED Provider Notes (Signed)
CSN: [id number]     Arrival date & time 11/10/15  7026 History   First MD Initiated Contact with Patient 11/10/15 [phone]     Chief Complaint  Patient presents with  . Nasal Congestion     (Consider location/radiation/quality/duration/timing/severity/associated sxs/prior Treatment) HPI Comments: 65yo M w/ PMH including HTN, HLD,T2DM, COPD, PVD, LV dysfunction who p/w nasal congestion and SOB. Pt was evaluated on 2/14 for nasal congestion and cough. He was started on doxycycline and prednisone course which he has been taking. He reports that his cough has improved but over the past 3 nights, he has had difficulty sleeping because when he lays flat he becomes restless and feels like he cannot breathe. When he sits up, this feeling improved. He denies any significant shortness of breath at rest or with exertion. No chest pain. He reports some head pressure but no severe headache. No fevers, vomiting, diarrhea, or abdominal pain. He has chronic lower extremity edema.  The history is provided by the patient.    Past Medical History  Diagnosis Date  . Other peripheral vascular disease(443.89)     bilateral lower extremity  . Hyperlipidemia   . Hypertension   . Transient global amnesia   . Contact dermatitis and other eczema due to plants (except food)   . Tobacco use disorder     dependent  . Atrial flutter ([place])     onset  2011. s/p EPS/RFA 12/2011  . Type II diabetes mellitus ([place])   . Chronic bronchitis   . GERD (gastroesophageal reflux disease)   . LV dysfunction     EF 45-50% 12/2011  . Adenomatous colon polyp   . COPD (chronic obstructive pulmonary disease) ([place])   . Arthritis     left hip replacement  . Cataract   . Tuberculosis    Past Surgical History  Procedure Laterality Date  . Partial hip arthroplasty Right 01/2000    "Right; replaced ball & stem"  . Partial hip arthroplasty Left   . Cystoscopy  11/2007    Dr Jeffie Pollock  . Cardiac electrophysiology mapping and ablation   03/2010  . Tee without cardioversion  12/28/2011    Procedure: TRANSESOPHAGEAL ECHOCARDIOGRAM (TEE);  Surgeon: Larey Dresser, MD;  Location: [facility];  Service: Cardiovascular;  Laterality: N/A;  . Colonoscopy with polypectomy  2006 & 2011    Dr [name]  . A flutter ablation N/A 12/28/2011    Procedure: ABLATION A FLUTTER;  Surgeon: Evans Lance, MD;  Location: [facility] CATH LAB;  Service: Cardiovascular;  Laterality: N/A;  . Colonoscopy    . Polypectomy    . Cardiac catheterization N/A 07/10/2015    Procedure: Left Heart Cath and Coronary Angiography;  Surgeon: Sherren Mocha, MD;  Location: [facility] CV LAB;  Service: Cardiovascular;  Laterality: N/A;   Family History  Problem Relation Age of Onset  . Stroke Mother 68  . Hypertension Mother   . Diabetes Mother   . Lung cancer Father     smoker  . Diabetes Paternal Grandmother   . Diabetes Brother     MI @ 64  . Diabetes Brother     Hepatitis C  . Throat cancer Paternal Uncle     1/2 uncle  . Heart attack Brother 58  . Colon cancer Neg Hx   . Esophageal cancer Neg Hx   . Rectal cancer Neg Hx   . Stomach cancer Neg Hx   . Crohn's disease Son    Social History  Substance Use  Topics  . Smoking status: Current Every Day Smoker -- 1.00 packs/day for 44 years    Types: Cigarettes  . Smokeless tobacco: Never Used     Comment: started @ age 52, up to 2 ppd;1.25-1.5 ppd as of 11/23/13  . Alcohol Use: 0.0 oz/week    0 Standard drinks or equivalent per week     Comment:  11/23/13 "2-3 drinks per year"    Review of Systems  10 Systems reviewed and are negative for acute change except as noted in the HPI.   Allergies  Niacin  Home Medications   Prior to Admission medications   Medication Sig Start Date End Date Taking? Authorizing Provider  acetaminophen (TYLENOL) 650 MG CR tablet Take 650 mg by mouth every 8 (eight) hours as needed for pain.    Historical Provider, MD  aspirin 325 MG tablet Take 325 mg by mouth daily.     Historical Provider, MD  cilostazol (PLETAL) 100 MG tablet Take 1 tablet (100 mg total) by mouth 2 (two) times daily. 10/25/15   Hoyt Koch, MD  clobetasol (OLUX) 0.05 % topical foam Apply topically 2 (two) times daily.  12/27/14   Historical Provider, MD  doxycycline (VIBRAMYCIN) 100 MG capsule Take 1 capsule (100 mg total) by mouth 2 (two) times daily. 11/05/15   Dorothyann Peng, NP  etanercept (ENBREL) 50 MG/ML injection Inject 50 mg into the skin 2 (two) times a week. Twice a week    Historical Provider, MD  Flaxseed, Linseed, (FLAX SEED OIL) 1000 MG CAPS Take 1 capsule by mouth 2 (two) times daily.     Historical Provider, MD  Fluticasone-Salmeterol (ADVAIR DISKUS) 100-50 MCG/DOSE AEPB inhale 1 dose by mouth twice a day Patient taking differently: Inhale 1 puff into the lungs 2 (two) times daily. inhale 1 dose by mouth twice a day 04/25/15   Hendricks Limes, MD  furosemide (LASIX) 40 MG tablet Take 1 tablet (40 mg total) by mouth daily. 09/30/15   Hoyt Koch, MD  hydrochlorothiazide (HYDRODIURIL) 25 MG tablet take 1 tablet by mouth once daily 08/21/15   Sherren Mocha, MD  HYDROcodone-homatropine [facility]) 5-1.5 MG/5ML syrup Take 5 mLs by mouth every 8 (eight) hours as needed for cough. 11/05/15   Dorothyann Peng, NP  Insulin Detemir (LEVEMIR FLEXTOUCH) 100 UNIT/ML Pen INJECT 24 UNITS INTO THE   SKIN DAILY 07/24/15   Elayne Snare, MD  Insulin Pen Needle (NOVOFINE) 32G X 6 MM MISC Use 2 needles daily with Levimir pen and Victoza pen  DX: E11.69 05/17/15   Elayne Snare, MD  losartan (COZAAR) 100 MG tablet take 1 tablet by mouth once daily 07/24/15   Sherren Mocha, MD  metformin (FORTAMET) 1000 MG (OSM) 24 hr tablet TAKE 1 TABLET TWICE A DAY 09/24/15   Elayne Snare, MD  metoprolol (LOPRESSOR) 100 MG tablet take 1 tablet by mouth twice a day 08/21/15   Sherren Mocha, MD  mupirocin ointment (BACTROBAN) 2 % Apply 1 application topically 2 (two) times daily as needed (affected areas).     Historical  Provider, MD  Omega-3 Fatty Acids (FISH OIL) 1000 MG CAPS Take 2 capsules by mouth 2 (two) times daily.     Historical Provider, MD  predniSONE (DELTASONE) 10 MG tablet 30 MG X 3 DAYS, 20 MG X 3 DAYS, 10 MG X 3 DAYS. 11/05/15   Dorothyann Peng, NP  rosuvastatin (CRESTOR) 10 MG tablet Take 1 tablet (10 mg total) by mouth daily. 10/28/15   Real Cons  Sharlet Salina, MD  tamsulosin (FLOMAX) 0.4 MG CAPS capsule Take 0.4 mg by mouth daily.     Historical Provider, MD  triamcinolone cream (KENALOG) 0.1 % Apply 1 application topically 2 (two) times daily as needed (affected area).     Historical Provider, MD  VICTOZA 18 MG/3ML SOPN INJECT 0.3ML (=1.'8MG'$ )      SUBCUTANEOUSLY DAILY 10/23/15   Elayne Snare, MD   BP 168/96 mmHg  Pulse 79  Temp(Src) 98 F (36.7 C) (Oral)  Resp 18  SpO2 92% Physical Exam  Constitutional: He is oriented to person, place, and time. He appears well-developed and well-nourished. No distress.  HENT:  Head: Normocephalic and atraumatic.  Moist mucous membranes  Eyes: Conjunctivae are normal. Pupils are equal, round, and reactive to light.  Neck: Neck supple.  Cardiovascular: Normal rate, regular rhythm and normal heart sounds.   No murmur heard. Pulmonary/Chest: Effort normal.  Course breath sounds bilaterally  Abdominal: Soft. Bowel sounds are normal. He exhibits no distension. There is no tenderness.  Musculoskeletal: He exhibits edema.  2+ pitting edema BL  Neurological: He is alert and oriented to person, place, and time.  Fluent speech  Skin: Skin is warm and dry.  Psychiatric: He has a normal mood and affect. Judgment normal.  Nursing note and vitals reviewed.   ED Course  Procedures (including critical care time) Labs Review Labs Reviewed  BASIC METABOLIC PANEL - Abnormal; Notable for the following:    CO2 20 (*)    Glucose, Bld 122 (*)    Calcium 8.8 (*)    All other components within normal limits  CBC WITH DIFFERENTIAL/PLATELET - Abnormal; Notable for the  following:    Platelets 129 (*)    All other components within normal limits  BRAIN NATRIURETIC PEPTIDE  HEMOGLOBIN A1C    Imaging Review No results found. I have personally reviewed and evaluated these lab results as part of my medical decision-making.   EKG Interpretation   Date/Time:  Sunday November 10 2015 09:11:01 EST Ventricular Rate:  74 PR Interval:  175 QRS Duration: 94 QT Interval:  407 QTC Calculation: 451 R Axis:   9 Text Interpretation:  Sinus rhythm ST elevation, consider inferior injury  No significant change since last tracing Confirmed by Antasia Haider MD, Aayla Marrocco  [phone]) on 11/10/2015 9:26:47 AM     Medications  methylPREDNISolone sodium succinate (SOLU-MEDROL) 125 mg/2 mL injection 60 mg (not administered)  benzonatate (TESSALON) capsule 100 mg (not administered)  doxycycline (VIBRA-TABS) tablet 100 mg (not administered)  levalbuterol (XOPENEX) nebulizer solution 0.63 mg (not administered)  insulin aspart (novoLOG) injection 0-15 Units (not administered)  insulin aspart (novoLOG) injection 0-5 Units (not administered)  ipratropium-albuterol (DUONEB) 0.5-2.5 (3) MG/3ML nebulizer solution 3 mL (3 mLs Nebulization Given 11/10/15 0908)  methylPREDNISolone sodium succinate (SOLU-MEDROL) 125 mg/2 mL injection 125 mg (125 mg Intravenous Given 11/10/15 1233)    MDM   Final diagnoses:  COPD exacerbation (HCC)  Hypoxia   Pt p/w shortness of breath laying flat at night for the past 3 nights in the setting of recent cough/cold symptoms. He was well-appearing at presentation. Vital signs notable for BP 168/96, O2 sat 89-92% on room air. Coarse breath sounds bilaterally but normal work of breathing. Obtained above lab work including BNP. EKG with no significant change from previous. Chest x-ray shows COPD changes but no acute abnormalities. Lab work unremarkable and a BNP normal.  Gave patient a DuoNeb and placed on supplemental oxygen for his mild hypoxia. Also gave  solumedrol.  Reexamination, the patient was comfortable but continued to have oxygen requirement of 3 L to maintain saturations in mid 90s. He continued to have wheezing on exam but with normal work of breathing. Because of his new hypoxia, discussed admission with Bryson Ha, [facility] NP, and pt admitted for further treatment.    Sharlett Iles, MD 11/10/15 1341

## 2015-11-10 NOTE — ED Notes (Signed)
p states that he was seen recently for nasal congestion and head pressure. Pt states he has had difficulty sleeping due to this.

## 2015-11-10 NOTE — ED Notes (Signed)
CM/HH lunch tray ordered.[name]

## 2015-11-11 DIAGNOSIS — I272 Other secondary pulmonary hypertension: Secondary | ICD-10-CM | POA: Diagnosis not present

## 2015-11-11 DIAGNOSIS — E785 Hyperlipidemia, unspecified: Secondary | ICD-10-CM | POA: Diagnosis present

## 2015-11-11 DIAGNOSIS — E1159 Type 2 diabetes mellitus with other circulatory complications: Secondary | ICD-10-CM

## 2015-11-11 DIAGNOSIS — T380X5A Adverse effect of glucocorticoids and synthetic analogues, initial encounter: Secondary | ICD-10-CM | POA: Diagnosis present

## 2015-11-11 DIAGNOSIS — J849 Interstitial pulmonary disease, unspecified: Secondary | ICD-10-CM | POA: Diagnosis present

## 2015-11-11 DIAGNOSIS — J9601 Acute respiratory failure with hypoxia: Secondary | ICD-10-CM

## 2015-11-11 DIAGNOSIS — E1169 Type 2 diabetes mellitus with other specified complication: Secondary | ICD-10-CM | POA: Diagnosis present

## 2015-11-11 DIAGNOSIS — I5022 Chronic systolic (congestive) heart failure: Secondary | ICD-10-CM | POA: Diagnosis present

## 2015-11-11 DIAGNOSIS — Z794 Long term (current) use of insulin: Secondary | ICD-10-CM | POA: Diagnosis not present

## 2015-11-11 DIAGNOSIS — I10 Essential (primary) hypertension: Secondary | ICD-10-CM | POA: Diagnosis not present

## 2015-11-11 DIAGNOSIS — J441 Chronic obstructive pulmonary disease with (acute) exacerbation: Principal | ICD-10-CM

## 2015-11-11 DIAGNOSIS — D696 Thrombocytopenia, unspecified: Secondary | ICD-10-CM

## 2015-11-11 DIAGNOSIS — E1151 Type 2 diabetes mellitus with diabetic peripheral angiopathy without gangrene: Secondary | ICD-10-CM | POA: Diagnosis present

## 2015-11-11 DIAGNOSIS — F17210 Nicotine dependence, cigarettes, uncomplicated: Secondary | ICD-10-CM | POA: Diagnosis present

## 2015-11-11 DIAGNOSIS — I110 Hypertensive heart disease with heart failure: Secondary | ICD-10-CM | POA: Diagnosis present

## 2015-11-11 DIAGNOSIS — L409 Psoriasis, unspecified: Secondary | ICD-10-CM | POA: Diagnosis present

## 2015-11-11 DIAGNOSIS — I2510 Atherosclerotic heart disease of native coronary artery without angina pectoris: Secondary | ICD-10-CM | POA: Diagnosis present

## 2015-11-11 DIAGNOSIS — R0902 Hypoxemia: Secondary | ICD-10-CM | POA: Diagnosis not present

## 2015-11-11 DIAGNOSIS — I4892 Unspecified atrial flutter: Secondary | ICD-10-CM | POA: Diagnosis not present

## 2015-11-11 LAB — CBC
HCT: 49.5 % (ref 39.0–52.0)
HEMOGLOBIN: 16.3 g/dL (ref 13.0–17.0)
MCH: 30.6 pg (ref 26.0–34.0)
MCHC: 32.9 g/dL (ref 30.0–36.0)
MCV: 92.9 fL (ref 78.0–100.0)
Platelets: 148 10*3/uL — ABNORMAL LOW (ref 150–400)
RBC: 5.33 MIL/uL (ref 4.22–5.81)
RDW: 13.6 % (ref 11.5–15.5)
WBC: 10.6 10*3/uL — AB (ref 4.0–10.5)

## 2015-11-11 LAB — GLUCOSE, CAPILLARY
GLUCOSE-CAPILLARY: 149 mg/dL — AB (ref 65–99)
Glucose-Capillary: 170 mg/dL — ABNORMAL HIGH (ref 65–99)
Glucose-Capillary: 131 mg/dL — ABNORMAL HIGH (ref 65–99)
Glucose-Capillary: 245 mg/dL — ABNORMAL HIGH (ref 65–99)

## 2015-11-11 LAB — BASIC METABOLIC PANEL
ANION GAP: 9 (ref 5–15)
BUN: 13 mg/dL (ref 6–20)
CALCIUM: 9.1 mg/dL (ref 8.9–10.3)
CO2: 26 mmol/L (ref 22–32)
Chloride: 104 mmol/L (ref 101–111)
Creatinine, Ser: 1.14 mg/dL (ref 0.61–1.24)
GFR calc Af Amer: >60 mL/min (ref >60)
GLUCOSE: 172 mg/dL — AB (ref 65–99)
Potassium: 4.7 mmol/L (ref 3.5–5.1)
Sodium: 139 mmol/L (ref 135–145)

## 2015-11-11 LAB — HEMOGLOBIN A1C
HEMOGLOBIN A1C: 7 % — AB (ref 4.8–5.6)
Mean Plasma Glucose: 154 mg/dL

## 2015-11-11 MED ORDER — INSULIN DETEMIR 100 UNIT/ML ~~LOC~~ SOLN
20.0000 [IU] | Freq: Two times a day (BID) | SUBCUTANEOUS | Status: DC
  Administered 2015-11-11 – 2015-11-12 (×3): 20 [IU] via SUBCUTANEOUS
  Filled 2015-11-11 (×4): qty 0.2

## 2015-11-11 MED ORDER — LEVALBUTEROL HCL 0.63 MG/3ML IN NEBU
0.6300 mg | INHALATION_SOLUTION | Freq: Two times a day (BID) | RESPIRATORY_TRACT | Status: DC
  Administered 2015-11-11 – 2015-11-12 (×3): 0.63 mg via RESPIRATORY_TRACT
  Filled 2015-11-11 (×3): qty 3

## 2015-11-11 MED ORDER — DOXYCYCLINE HYCLATE 100 MG PO TABS
100.0000 mg | ORAL_TABLET | Freq: Two times a day (BID) | ORAL | Status: DC
  Administered 2015-11-11 – 2015-11-12 (×2): 100 mg via ORAL
  Filled 2015-11-11 (×2): qty 1

## 2015-11-11 MED ORDER — DOXYCYCLINE HYCLATE 100 MG IV SOLR
100.0000 mg | Freq: Two times a day (BID) | INTRAVENOUS | Status: AC
Start: 2015-11-11 — End: 2015-11-11
  Administered 2015-11-11: 100 mg via INTRAVENOUS
  Filled 2015-11-11 (×2): qty 100

## 2015-11-11 NOTE — Progress Notes (Signed)
Spoke with pharmacy regarding patient's Victoza. Patient used his home Victoza in room and was not comfortable letting me take it to pharmacy for [name] to [place]. Pharmacist to speak with patient about this and to put in [facility] a place to chart that patient took home med. Will pass on to oncoming RN as well.

## 2015-11-11 NOTE — Progress Notes (Signed)
[facility] PROGRESS NOTE    Progress Note   Yer Olivencia DTO:[id number] DOB: 16-Feb-1951 DOA: 11/10/2015 PCP: Hoyt Koch, MD   Brief Narrative:   Neil Housekeeper Sr. is an 65 y.o. male past medical history of tobacco with COPD non-oxygen dependent with peripheral vascular disease that comes in for shortness of breath and productive cough for the last several days. He is saw his primary care doctor on the 15th who prescribed a course of prednisone and Doxy but has not improved.  Assessment/Plan:   Acute respiratory failure with hypoxia (HCC) COPD exacerbation ([place]): Failed outpatient treatment, I agree with IV doxycycline and Solu-Medrol. Titrations remained greater than 90% on 3 L. Ambulate on oxygen in the morning.  Chronic systolic heart failure: Last echo in 2013 showed an EF of 45% with moderate pulmonary hypertension. Currently seems euvolemic we'll continue current regimen.  Type 2 diabetes mellitus with vascular disease ([place]) Blood glucose mildly high today likely due to steroids. We'll start him on long-acting insulin plus sliding scale insulin. Hemoglobin A1c is pending.  Hyperlipidemia Cont statins  Essential HTN (hypertension): BP stable continue current regimen.  Atrial flutter maintaining NSR post ablation Now in sinus rhythm, on beta blocker documented ventilation at this time since has been in sinus rhythm post ablation. CHADVASc = 4  PVD (peripheral vascular disease) with claudication (HCC) Continue Plethal.  Psoriasis Continue Enbrel.  CAD (coronary artery disease)  Thrombocytopenia ([place]): Follow-up with hematology as an outpatient.    DVT Prophylaxis - Lovenox ordered.  Family Communication: none Disposition Plan: Home in am Code Status:     Code Status Orders        Start     Ordered   11/10/15 1639  Full code   Continuous     11/10/15 1638    Code Status History    Date Active Date Inactive Code  Status Order ID Comments User Context   07/10/2015 12:59 PM 07/10/2015  7:04 PM Full Code [id number]  Sherren Mocha, MD Inpatient        IV Access:    Peripheral IV   Procedures and diagnostic studies:   Dg Chest 2 View  11/10/2015  CLINICAL DATA:  Hypoxia.  Smoker. EXAM: CHEST  2 VIEW COMPARISON:  11/05/2015. FINDINGS: Borderline enlarged cardiac silhouette with a mild decrease in size. No significant change in diffuse prominence of the interstitial markings and right lateral scarring. The hemidiaphragms remain flattened. Moderate right AC joint degenerative changes. IMPRESSION: 1. No acute abnormality. 2. Stable changes of COPD with interstitial fibrosis. Electronically Signed   By: Claudie Revering M.D.   On: 11/10/2015 10:02     Medical Consultants:    None.  Anti-Infectives:   Anti-infectives    Start     Dose/Rate Route Frequency Ordered Stop   11/10/15 1230  doxycycline (VIBRA-TABS) tablet 100 mg     100 mg Oral Every 12 hours 11/10/15 1222        Subjective:    Neil Housekeeper Sr. his breathing is unchanged. He does relate he was able to sleep last night.  Objective:    Filed Vitals:   11/10/15 2040 11/10/15 2141 11/11/15 0532 11/11/15 0759  BP: 146/60  137/69   Pulse: 85  65   Temp: 98.4 F (36.9 C)  98 F (36.7 C)   TempSrc: Oral  Oral   Resp: 18  18   Height:      Weight:      SpO2: 94%  95% 96% 94%    Intake/Output Summary (Last 24 hours) at 11/11/15 0839 Last data filed at 11/11/15 0530  Gross per 24 hour  Intake    480 ml  Output   1865 ml  Net  -1385 ml   Filed Weights   11/10/15 0946 11/10/15 1702  Weight: 121.11 kg (267 lb) 121.201 kg (267 lb 3.2 oz)    Exam: Gen:  NAD Cardiovascular:  RRR, No M/R/G Chest and lungs:   Moderate air movement with wheezing bilaterally. Abdomen:  Abdomen soft, NT/ND, + BS Extremities:  No edema   Data Reviewed:    Labs: Basic Metabolic Panel:  Recent Labs Lab 11/05/15 1518 11/10/15 0920  11/11/15 0634  NA 140 135 139  K 4.0 3.9 4.7  CL 104 102 104  CO2 28 20* 26  GLUCOSE 114* 122* 172*  BUN '9 11 13  '$ CREATININE 1.37 1.13 1.14  CALCIUM 9.0 8.8* 9.1   GFR Estimated Creatinine Clearance: 85.6 mL/min (by C-G formula based on Cr of 1.14). Liver Function Tests: No results for input(s): AST, ALT, ALKPHOS, BILITOT, PROT, ALBUMIN in the last 168 hours. No results for input(s): LIPASE, AMYLASE in the last 168 hours. No results for input(s): AMMONIA in the last 168 hours. Coagulation profile No results for input(s): INR, PROTIME in the last 168 hours.  CBC:  Recent Labs Lab 11/05/15 1518 11/10/15 0920 11/11/15 0634  WBC 6.5 8.4 10.6*  NEUTROABS 3.8 5.0  --   HGB 16.1 16.6 16.3  HCT 48.8 49.0 49.5  MCV 91.1 91.1 92.9  PLT 136.0* 129* 148*   Cardiac Enzymes: No results for input(s): CKTOTAL, CKMB, CKMBINDEX, TROPONINI in the last 168 hours. BNP (last 3 results) No results for input(s): PROBNP in the last 8760 hours. CBG:  Recent Labs Lab 11/10/15 1423 11/10/15 2037  GLUCAP 255* 247*   D-Dimer: No results for input(s): DDIMER in the last 72 hours. Hgb A1c: No results for input(s): HGBA1C in the last 72 hours. Lipid Profile: No results for input(s): CHOL, HDL, LDLCALC, TRIG, CHOLHDL, LDLDIRECT in the last 72 hours. Thyroid function studies: No results for input(s): TSH, T4TOTAL, T3FREE, THYROIDAB in the last 72 hours.  Invalid input(s): FREET3 Anemia work up: No results for input(s): VITAMINB12, FOLATE, FERRITIN, TIBC, IRON, RETICCTPCT in the last 72 hours. Sepsis Labs:  Recent Labs Lab 11/05/15 1518 11/10/15 0920 11/11/15 0634  WBC 6.5 8.4 10.6*   Microbiology No results found for this or any previous visit (from the past 240 hour(s)).   Medications:   . antiseptic oral rinse  7 mL Mouth Rinse BID  . aspirin  325 mg Oral Daily  . benzonatate  100 mg Oral TID  . cilostazol  100 mg Oral BID  . doxycycline  100 mg Oral Q12H  . enoxaparin  (LOVENOX) injection  40 mg Subcutaneous Q24H  . furosemide  40 mg Oral Daily  . hydrochlorothiazide  25 mg Oral Daily  . insulin aspart  0-15 Units Subcutaneous TID WC  . insulin aspart  0-5 Units Subcutaneous QHS  . insulin detemir  24 Units Subcutaneous QHS  . levalbuterol  0.63 mg Nebulization BID  . Liraglutide  1.8 mg Injection Daily  . losartan  100 mg Oral Daily  . methylPREDNISolone (SOLU-MEDROL) injection  60 mg Intravenous Q6H  . metoprolol  100 mg Oral BID  . multivitamin with minerals  1 tablet Oral Daily  . omega-3 acid ethyl esters  1 g Oral Daily  . rosuvastatin  10 mg Oral Daily  . tamsulosin  0.4 mg Oral Daily   Continuous Infusions:   Time spent: 25 min     Charlynne Cousins  [facility] Pager [phone]  *Please refer to [email or web address], password [email or web address] to get updated schedule on who will round on this patient, as hospitalists switch teams weekly. If 7PM-7AM, please contact night-coverage at [email or web address], password [email or web address] for any overnight needs.  11/11/2015, 8:39 AM

## 2015-11-11 NOTE — Progress Notes (Addendum)
CM received consult:Would like to prescribe Xopenex 0.63 MDI for rescue inhaler at dc. OP Cardiologist stopped albuterol 2/2 atrial fib and risk for tachy arrhythmia--please check on co-pay and/or pre authorization.  S/W CHEVELLE C. @ [facility] SCRIPT # [phone]   BRAND :   XOPENEX 0 .63 MDI RESCULE INHALER EVERY 8 HRS   COVER- YES  CO-PAY- $ 24.00  TIER- 2 DRUG  PRIOR APPROVAL - YES # [phone]  PHARMACY : [facility]   GENERIC:  LEVALBQTEVONA     COVER- YES  CO-PAY- $ 10.00  TIER- 1 DRUG  PRIOR APPROVAL - NO  CM to f/u with d/c needs. Whitman Hero RN,BSN,CM [phone] PHARMACY: SAME AS ABOVE

## 2015-11-12 DIAGNOSIS — I4892 Unspecified atrial flutter: Secondary | ICD-10-CM

## 2015-11-12 LAB — GLUCOSE, CAPILLARY: Glucose-Capillary: 257 mg/dL — ABNORMAL HIGH (ref 65–99)

## 2015-11-12 MED ORDER — LEVALBUTEROL HCL 0.63 MG/3ML IN NEBU: 0.6300 mg | INHALATION_SOLUTION | Freq: Four times a day (QID) | RESPIRATORY_TRACT | Status: DC | PRN

## 2015-11-12 MED ORDER — PREDNISONE 10 MG PO TABS: ORAL_TABLET | ORAL | Status: DC

## 2015-11-12 MED ORDER — DOXYCYCLINE HYCLATE 100 MG PO TABS: 100.0000 mg | ORAL_TABLET | Freq: Two times a day (BID) | ORAL | Status: DC

## 2015-11-12 NOTE — Discharge Summary (Signed)
Physician Discharge Summary  Neil Carroll RSW:[id number] DOB: 10-14-1950 DOA: 11/10/2015  PCP: Hoyt Koch, MD  Admit date: 11/10/2015 Discharge date: 11/12/2015  Time spent: 58mnutes  Recommendations for Outpatient Follow-up:  1. Follow up Primary care doctor as needed.   Discharge Diagnoses:  Principal Problem:   COPD exacerbation ([facility] Active Problems:   Type 2 diabetes mellitus with vascular disease ([facility]   Hyperlipidemia   Smoker   HTN (hypertension)   Atrial flutter maintaining NSR post ablation   Left ventricular systolic dysfunction   PVD (peripheral vascular disease) with claudication (HCC)   Psoriasis   Interstitial lung disease (HCC)   Acute respiratory failure with hypoxia (HCC)   CAD (coronary artery disease)   Thrombocytopenia ([facility]   Pulmonary HTN ([facility]   Discharge Condition: stable  Diet recommendation: regular  Filed Weights   11/10/15 0946 11/10/15 1702  Weight: 121.11 kg (267 lb) 121.201 kg (267 lb 3.2 oz)    History of present illness:  65year old male with past medical history of ongoing tobacco abuse COPD peripheral vascular disease of percent CBD complaining of shortness of breath and productive cough that started a few days prior to admission. He relates he went and saw his primary care doctor given the cycle of prednisone and doxycycline but the cough and shortness of breath did not improve.  Hospital Course:  Acute respiratory failure with hypoxia due to COPD exacerbation: Failed outpatient treatment he was started on IV doxyciclyine Solu-Medrol. He had to be put on oxygen to maintain saturations greater than 90% on 3 L. After 2 days of treatment is improved he will go home on prednisone and steroid as a taper.  Chronic systolic heart failure: Euvolemic no changes were made to his medication.  Diabetes mellitus type 2 with vascular disease: No changes were made to his medication.  Essential hypertension: No changes  made.  Atrial flutter status post ablation now in sinus rhythm: No changes were made, CHADVASc = 4   Procedures:  CXR  Consultations:  none  Discharge Exam: Filed Vitals:   11/12/15 0824 11/12/15 1002  BP:  136/62  Pulse: 75 82  Temp:    Resp: 18     General: A&O x3 Cardiovascular: RRR Respiratory: good air movement CTA B/L  Discharge Instructions   Discharge Instructions    Diet - low sodium heart healthy    Complete by:  As directed      Increase activity slowly    Complete by:  As directed           Current Discharge Medication List    START taking these medications   Details  doxycycline (VIBRA-TABS) 100 MG tablet Take 1 tablet (100 mg total) by mouth every 12 (twelve) hours. Qty: 10 tablet, Refills: 0      CONTINUE these medications which have CHANGED   Details  predniSONE (DELTASONE) 10 MG tablet 30 MG X 3 DAYS, 20 MG X 3 DAYS, 10 MG X 3 DAYS. Qty: 18 tablet, Refills: 0      CONTINUE these medications which have NOT CHANGED   Details  acetaminophen (TYLENOL) 650 MG CR tablet Take 650 mg by mouth every 8 (eight) hours as needed for pain.    aspirin 325 MG tablet Take 325 mg by mouth daily.    cilostazol (PLETAL) 100 MG tablet Take 1 tablet (100 mg total) by mouth 2 (two) times daily. Qty: 180 tablet, Refills: 1    clobetasol (OLUX) 0.05 % topical foam Apply  topically 2 (two) times daily.  Refills: 0    Flaxseed, Linseed, (FLAX SEED OIL) 1000 MG CAPS Take 1 capsule by mouth 2 (two) times daily.     Fluticasone-Salmeterol (ADVAIR DISKUS) 100-50 MCG/DOSE AEPB inhale 1 dose by mouth twice a day Qty: 180 each, Refills: 2   Associated Diagnoses: Obstructive chronic bronchitis without exacerbation (HCC)    furosemide (LASIX) 40 MG tablet Take 1 tablet (40 mg total) by mouth daily. Qty: 90 tablet, Refills: 3    hydrochlorothiazide (HYDRODIURIL) 25 MG tablet take 1 tablet by mouth once daily Qty: 90 tablet, Refills: 3    Insulin Detemir (LEVEMIR  FLEXTOUCH) 100 UNIT/ML Pen INJECT 24 UNITS INTO THE   SKIN DAILY Qty: 30 mL, Refills: 1    Insulin Pen Needle (NOVOFINE) 32G X 6 MM MISC Use 2 needles daily with Levimir pen and Victoza pen  DX: E11.69 Qty: 100 each, Refills: 5    losartan (COZAAR) 100 MG tablet take 1 tablet by mouth once daily Qty: 30 tablet, Refills: 10    metformin (FORTAMET) 1000 MG (OSM) 24 hr tablet TAKE 1 TABLET TWICE A DAY Qty: 180 tablet, Refills: 1    metoprolol (LOPRESSOR) 100 MG tablet take 1 tablet by mouth twice a day Qty: 180 tablet, Refills: 3    Multiple Vitamins-Minerals (CENTRUM ADULTS) TABS Take 1 tablet by mouth daily.    mupirocin ointment (BACTROBAN) 2 % Apply 1 application topically 2 (two) times daily as needed (affected areas).     Omega-3 Fatty Acids (FISH OIL) 1000 MG CAPS Take 2 capsules by mouth 2 (two) times daily.     rosuvastatin (CRESTOR) 10 MG tablet Take 1 tablet (10 mg total) by mouth daily. Qty: 90 tablet, Refills: 3    tamsulosin (FLOMAX) 0.4 MG CAPS capsule Take 0.4 mg by mouth daily.     triamcinolone cream (KENALOG) 0.1 % Apply 1 application topically 2 (two) times daily as needed (affected area).     VICTOZA 18 MG/3ML SOPN INJECT 0.3ML (=1.'8MG'$ )      SUBCUTANEOUSLY DAILY Qty: 27 mL, Refills: 1    HYDROcodone-homatropine (HYCODAN) 5-1.5 MG/5ML syrup Take 5 mLs by mouth every 8 (eight) hours as needed for cough. Qty: 120 mL, Refills: 0      STOP taking these medications     doxycycline (VIBRAMYCIN) 100 MG capsule      etanercept (ENBREL) 50 MG/ML injection        Allergies  Allergen Reactions  . Niacin Nausea Only    REACTION: upset stomach   Follow-up Information    Follow up with Hoyt Koch, MD In 2 weeks.   Specialty:  Internal Medicine   Contact information:   [place] [id number] [phone]        The results of significant diagnostics from this hospitalization (including imaging, microbiology, ancillary and  laboratory) are listed below for reference.    Significant Diagnostic Studies: Dg Chest 2 View  11/10/2015  CLINICAL DATA:  Hypoxia.  Smoker. EXAM: CHEST  2 VIEW COMPARISON:  11/05/2015. FINDINGS: Borderline enlarged cardiac silhouette with a mild decrease in size. No significant change in diffuse prominence of the interstitial markings and right lateral scarring. The hemidiaphragms remain flattened. Moderate right AC joint degenerative changes. IMPRESSION: 1. No acute abnormality. 2. Stable changes of COPD with interstitial fibrosis. Electronically Signed   By: Claudie Revering M.D.   On: 11/10/2015 10:02   Dg Chest 2 View  11/05/2015  CLINICAL DATA:  Cough  and congestion and shortness of breath for 2 days. Diabetes. COPD. Heart palpitation. EXAM: CHEST  2 VIEW COMPARISON:  01/29/2015. FINDINGS: Cardiomegaly. Calcified tortuous aorta. Chronic changes throughout both lung fields, interstitial thickening, worse on the RIGHT, With stable previously noted pleural-based density in the RIGHT upper lobe related to scarring. No active infiltrates or overt failure. No effusion or pneumothorax. No osseous findings. IMPRESSION: Chronic changes as described. Cardiomegaly. No active infiltrates or failure. Electronically Signed   By: Staci Righter M.D.   On: 11/05/2015 16:19    Microbiology: No results found for this or any previous visit (from the past 240 hour(s)).   Labs: Basic Metabolic Panel:  Recent Labs Lab 11/05/15 1518 11/10/15 0920 11/11/15 0634  NA 140 135 139  K 4.0 3.9 4.7  CL 104 102 104  CO2 28 20* 26  GLUCOSE 114* 122* 172*  BUN '9 11 13  '$ CREATININE 1.37 1.13 1.14  CALCIUM 9.0 8.8* 9.1   Liver Function Tests: No results for input(s): AST, ALT, ALKPHOS, BILITOT, PROT, ALBUMIN in the last 168 hours. No results for input(s): LIPASE, AMYLASE in the last 168 hours. No results for input(s): AMMONIA in the last 168 hours. CBC:  Recent Labs Lab 11/05/15 1518 11/10/15 0920 11/11/15 0634   WBC 6.5 8.4 10.6*  NEUTROABS 3.8 5.0  --   HGB 16.1 16.6 16.3  HCT 48.8 49.0 49.5  MCV 91.1 91.1 92.9  PLT 136.0* 129* 148*   Cardiac Enzymes: No results for input(s): CKTOTAL, CKMB, CKMBINDEX, TROPONINI in the last 168 hours. BNP: BNP (last 3 results)  Recent Labs  11/10/15 0920  BNP 52.1    ProBNP (last 3 results) No results for input(s): PROBNP in the last 8760 hours.  CBG:  Recent Labs Lab 11/11/15 0806 11/11/15 1210 11/11/15 1714 11/11/15 2058 11/12/15 0804  GLUCAP 149* 170* 131* 245* 257*       Signed:  Charlynne Cousins MD.  [facility] 11/12/2015, 11:23 AM

## 2015-11-14 ENCOUNTER — Telehealth: Payer: Self-pay | Admitting: Internal Medicine

## 2015-11-14 ENCOUNTER — Encounter (HOSPITAL_COMMUNITY): Payer: Self-pay | Admitting: *Deleted

## 2015-11-14 ENCOUNTER — Telehealth: Payer: Self-pay | Admitting: *Deleted

## 2015-11-14 ENCOUNTER — Emergency Department (HOSPITAL_COMMUNITY): Payer: Medicare Other

## 2015-11-14 ENCOUNTER — Emergency Department (HOSPITAL_COMMUNITY)
Admission: EM | Admit: 2015-11-14 | Discharge: 2015-11-14 | Disposition: A | Discharge status: Home / Self Care | Payer: Medicare Other | Attending: Emergency Medicine | Admitting: Emergency Medicine

## 2015-11-14 DIAGNOSIS — F17210 Nicotine dependence, cigarettes, uncomplicated: Secondary | ICD-10-CM | POA: Insufficient documentation

## 2015-11-14 DIAGNOSIS — E119 Type 2 diabetes mellitus without complications: Secondary | ICD-10-CM | POA: Insufficient documentation

## 2015-11-14 DIAGNOSIS — M79602 Pain in left arm: Secondary | ICD-10-CM | POA: Insufficient documentation

## 2015-11-14 DIAGNOSIS — I10 Essential (primary) hypertension: Secondary | ICD-10-CM | POA: Diagnosis not present

## 2015-11-14 DIAGNOSIS — J449 Chronic obstructive pulmonary disease, unspecified: Secondary | ICD-10-CM | POA: Diagnosis not present

## 2015-11-14 DIAGNOSIS — R0902 Hypoxemia: Secondary | ICD-10-CM | POA: Diagnosis not present

## 2015-11-14 LAB — BASIC METABOLIC PANEL
Anion gap: 11 (ref 5–15)
BUN: 18 mg/dL (ref 6–20)
CALCIUM: 9.3 mg/dL (ref 8.9–10.3)
CHLORIDE: 106 mmol/L (ref 101–111)
CO2: 22 mmol/L (ref 22–32)
CREATININE: 1.33 mg/dL — AB (ref 0.61–1.24)
GFR calc non Af Amer: 55 mL/min — ABNORMAL LOW (ref >60)
Glucose, Bld: 234 mg/dL — ABNORMAL HIGH (ref 65–99)
Potassium: 4.8 mmol/L (ref 3.5–5.1)
Sodium: 139 mmol/L (ref 135–145)

## 2015-11-14 LAB — CBC
HCT: 52.8 % — ABNORMAL HIGH (ref 39.0–52.0)
Hemoglobin: 17.1 g/dL — ABNORMAL HIGH (ref 13.0–17.0)
MCH: 29.7 pg (ref 26.0–34.0)
MCHC: 32.4 g/dL (ref 30.0–36.0)
MCV: 91.8 fL (ref 78.0–100.0)
PLATELETS: 173 10*3/uL (ref 150–400)
RBC: 5.75 MIL/uL (ref 4.22–5.81)
RDW: 13.7 % (ref 11.5–15.5)
WBC: 11.1 10*3/uL — ABNORMAL HIGH (ref 4.0–10.5)

## 2015-11-14 LAB — I-STAT TROPONIN, ED: TROPONIN I, POC: 0.01 ng/mL (ref 0.00–0.08)

## 2015-11-14 NOTE — Telephone Encounter (Signed)
Agree with ER.

## 2015-11-14 NOTE — Telephone Encounter (Signed)
Patient Name: Neil Carroll  DOB: 08/05/1951    Initial Comment Caller states was in the hosp- COPD was acting up- currently pressure around head and anxious feeling tightness and ache in left arm *Not having a hard time breathing at this moment   Nurse Assessment  Nurse: Mallie Mussel, RN, Alveta Heimlich Date/Time Eilene Ghazi Time): 11/14/2015 2:39:36 PM  Confirm and document reason for call. If symptomatic, describe symptoms. You must click the next button to save text entered. ---Caller states that he laid down to get a nap and developed some pressure around his head. He states that it is like a sinus headache. He has "an uneasiness" around his head. He has tightness and an achy feeling beginning in his left arm. It keeps getting worse. It tends to go into his shoulder. The left side of his neck is stiff. Denies difficulty breathing. He has a history of AFib.  Has the patient traveled out of the country within the last 30 days? ---No  Does the patient have any new or worsening symptoms? ---Yes  Will a triage be completed? ---Yes  Related visit to physician within the last 2 weeks? ---No  Does the PT have any chronic conditions? (i.e. diabetes, asthma, etc.) ---Yes  List chronic conditions. ---COPD, HTN, AFib  Is this a behavioral health or substance abuse call? ---No     Guidelines    Guideline Title Affirmed Question Affirmed Notes  Arm Pain [1] Age > 40 AND [2] no obvious cause AND [3] pain even when not moving the arm (Exception: pain is clearly made worse by moving arm or bending neck)    Final Disposition User   Go to ED Now Mallie Mussel, RN, [place] states that he has arm pain when not moving the arm. Moving the arm helps the pain to subside.   Referrals  [facility] - ED   Disagree/Comply: Comply

## 2015-11-14 NOTE — ED Provider Notes (Signed)
Patient eloped from the emergency department before provider evaluation. I did not see or evaluate this patient. Triage note states he presents for left arm pain which radiates to the left neck and head.  It also states that he was recently discharged after a CPOD exacerbation.    Pt was never placed in a room for me to evaluate.      Jarrett Soho Nathian Stencil, PA-C 11/14/15 [place], MD 11/14/15 2239

## 2015-11-14 NOTE — Telephone Encounter (Signed)
Called pt to get him set-up for TCM appt no answer LMOM RTC...Johny Chess

## 2015-11-14 NOTE — ED Notes (Signed)
PT here for L arm pain that radiates to L neck and head.  Neck pain increases with movement.  Was recently discharged for COPD exacerbation.

## 2015-11-14 NOTE — ED Notes (Signed)
Pt decided to leave AMA.  Advised pt he needed to stay but he was adamit he wanted to leave.  Pt stated his s/s had resolved and he would go to the doctor in the morning.

## 2015-11-15 ENCOUNTER — Ambulatory Visit (INDEPENDENT_AMBULATORY_CARE_PROVIDER_SITE_OTHER): Payer: Medicare Other | Admitting: Cardiology

## 2015-11-15 ENCOUNTER — Encounter: Payer: Self-pay | Admitting: Cardiology

## 2015-11-15 VITALS — BP 140/78 | HR 75 | Ht 71.0 in | Wt 271.0 lb

## 2015-11-15 DIAGNOSIS — I480 Paroxysmal atrial fibrillation: Secondary | ICD-10-CM | POA: Diagnosis not present

## 2015-11-15 DIAGNOSIS — I4891 Unspecified atrial fibrillation: Secondary | ICD-10-CM | POA: Diagnosis not present

## 2015-11-15 LAB — TSH: TSH: 0.84 m[IU]/L (ref 0.40–4.50)

## 2015-11-15 LAB — MAGNESIUM: Magnesium: 2.1 mg/dL (ref 1.5–2.5)

## 2015-11-15 NOTE — Telephone Encounter (Signed)
Pt return call back completed TCM call below.../lmb  Transition Care Management Follow-up Telephone Call   Date discharged? 11/12/15   How have you been since you were released from the hospital? Pt states he is feeling ok   Do you understand why you were in the hospital? YES   Do you understand the discharge instructions? YES   Where were you discharged to? Home   Items Reviewed:  Medications reviewed: YES  Allergies reviewed: YES  Dietary changes reviewed:NO  Referrals reviewed: No referral needed   Functional Questionnaire:   Activities of Daily Living (ADLs):   He states he are independent in the following: ambulation, bathing and hygiene, feeding, continence, grooming, toileting and dressing States he doesn't require assistance    Any transportation issues/concerns?: NO   Any patient concerns? NO   Confirmed importance and date/time of follow-up visits scheduled: appt 11/21/15  Provider Appointment booked with Terri Piedra  Confirmed with patient if condition begins to worsen call PCP or go to the ER.  Patient was given the office number and encouraged to call back with question or concerns.  : YES

## 2015-11-15 NOTE — Patient Instructions (Signed)
Medication Instructions:  Your physician recommends that you continue on your current medications as directed. Please refer to the Current Medication list given to you today.   Labwork: TODAY MAGNESIUM LEVEL, TSH  Testing/Procedures: Your physician has recommended that you wear an event monitor. Event monitors are medical devices that record the heart's electrical activity. Doctors most often [name] these monitors to diagnose arrhythmias. Arrhythmias are problems with the speed or rhythm of the heartbeat. The monitor is a small, portable device. You can wear one while you do your normal daily activities. This is usually used to diagnose what is causing palpitations/syncope (passing out).   Follow-Up: 1 MONTH WITH DR. Caryl Comes OR EP APP  Any Other Special Instructions Will Be Listed Below (If Applicable).  If you need a refill on your cardiac medications before your next appointment, please call your pharmacy.

## 2015-11-15 NOTE — Progress Notes (Signed)
11/15/2015 Neil Housekeeper Sr.   February 19, 1951  [id number]  Primary Physician Hoyt Koch, MD Primary Cardiologist: Dr. Burt Knack  Reason for Visit/CC: Palpitations and Dyspnea  HPI:  65 y/o male followed by Dr. Burt Knack, who has a history of atrial flutter status post redo frequency ablation, peripheral arterial disease with known SFA occlusive disease bilaterally, HTN, DM, COPD, tobacco use and CAD. He recently underwent a Bath by Dr. Burt Knack 06/2015. This showed severe distal left circumflex stenosis - recommend medical therapy as small amount of myocardium supplied, mild nonobstructive stenosis of a large, wrap around LAD and total occlusion of a small, codominant RCA. Dr. Burt Knack did not feel that PCI would significantly impact symptoms or overall clinical outcome, thus medical therapy was elected. He has been maintained on Plavix, metoprolol, Crestor and Losartan. He also takes Pletal for his PVD.   He was recently admitted to [facility] by Internal Medicine. He presented with complaint of dyspnea. BNP was normal at 52. He was admitted for presumed COPD exacerbation and treated with prednisone and antibiotics. He was discharged from the hospital on 11/10/15. He presented back to the ED last night with a complaint of palpitations. EKG showed atrial flutter with a RVR of 134 bpm. CBC was negative for anemia. BMP showed normal K and renal function. K was 4.8. He reports that he left the ED before being seen by an ED physician as his symptoms resolved and he was feeling better.  He presents to clinic today for f/u. EKG shows NSR. HR is 76 bpm. QT/QTc is WNL at 394/443. He denies any symptoms at this moment.      Current Outpatient Prescriptions  Medication Sig Dispense Refill  . acetaminophen (TYLENOL) 650 MG CR tablet Take 650 mg by mouth every 8 (eight) hours as needed for pain.    [name] aspirin 325 MG tablet Take 325 mg by mouth daily.    . cilostazol (PLETAL) 100 MG tablet Take 1 tablet (100 mg  total) by mouth 2 (two) times daily. 180 tablet 1  . clobetasol (OLUX) 0.05 % topical foam Apply topically 2 (two) times daily.   0  . doxycycline (VIBRA-TABS) 100 MG tablet Take 1 tablet (100 mg total) by mouth every 12 (twelve) hours. 10 tablet 0  . Flaxseed, Linseed, (FLAX SEED OIL) 1000 MG CAPS Take 1 capsule by mouth 2 (two) times daily.     . Fluticasone-Salmeterol (ADVAIR DISKUS) 100-50 MCG/DOSE AEPB inhale 1 dose by mouth twice a day (Patient taking differently: Inhale 1 puff into the lungs 2 (two) times daily. inhale 1 dose by mouth twice a day) 180 each 2  . furosemide (LASIX) 40 MG tablet Take 1 tablet (40 mg total) by mouth daily. 90 tablet 3  . hydrochlorothiazide (HYDRODIURIL) 25 MG tablet take 1 tablet by mouth once daily 90 tablet 3  . HYDROcodone-homatropine (HYCODAN) 5-1.5 MG/5ML syrup Take 5 mLs by mouth every 8 (eight) hours as needed for cough. 120 mL 0  . Insulin Detemir (LEVEMIR FLEXTOUCH) 100 UNIT/ML Pen INJECT 24 UNITS INTO THE   SKIN DAILY 30 mL 1  . Insulin Pen Needle (NOVOFINE) 32G X 6 MM MISC Use 2 needles daily with Levimir pen and Victoza pen  DX: E11.69 100 each 5  . losartan (COZAAR) 100 MG tablet take 1 tablet by mouth once daily 30 tablet 10  . metformin (FORTAMET) 1000 MG (OSM) 24 hr tablet TAKE 1 TABLET TWICE A DAY 180 tablet 1  . metoprolol (LOPRESSOR) 100  MG tablet take 1 tablet by mouth twice a day 180 tablet 3  . Multiple Vitamins-Minerals (CENTRUM ADULTS) TABS Take 1 tablet by mouth daily.    . mupirocin ointment (BACTROBAN) 2 % Apply 1 application topically 2 (two) times daily as needed (affected areas).     . Omega-3 Fatty Acids (FISH OIL) 1000 MG CAPS Take 2 capsules by mouth 2 (two) times daily.     . predniSONE (DELTASONE) 10 MG tablet 30 MG X 3 DAYS, 20 MG X 3 DAYS, 10 MG X 3 DAYS. 18 tablet 0  . rosuvastatin (CRESTOR) 10 MG tablet Take 1 tablet (10 mg total) by mouth daily. 90 tablet 3  . tamsulosin (FLOMAX) 0.4 MG CAPS capsule Take 0.4 mg by mouth  daily.     [name] triamcinolone cream (KENALOG) 0.1 % Apply 1 application topically 2 (two) times daily as needed (affected area).     [name] VICTOZA 18 MG/3ML SOPN INJECT 0.3ML (=1.'8MG'$ )      SUBCUTANEOUSLY DAILY 27 mL 1   No current facility-administered medications for this visit.    Allergies  Allergen Reactions  . Niacin Nausea Only    REACTION: upset stomach    Social History   Social History  . Marital Status: Widowed    Spouse Name: N/A  . Number of Children: 5  . Years of Education: N/A   Occupational History  . security guard    Social History Main Topics  . Smoking status: Current Every Day Smoker -- 1.00 packs/day for 44 years    Types: Cigarettes  . Smokeless tobacco: Never Used     Comment: started @ age 75, up to 2 ppd;1.25-1.5 ppd as of 11/23/13  . Alcohol Use: 0.0 oz/week    0 Standard drinks or equivalent per week     Comment:  11/23/13 "2-3 drinks per year"  . Drug Use: No  . Sexual Activity: Not Currently   Other Topics Concern  . Not on file   Social History Narrative     Review of Systems: General: negative for chills, fever, night sweats or weight changes.  Cardiovascular: negative for chest pain, dyspnea on exertion, edema, orthopnea, palpitations, paroxysmal nocturnal dyspnea or shortness of breath Dermatological: negative for rash Respiratory: negative for cough or wheezing Urologic: negative for hematuria Abdominal: negative for nausea, vomiting, diarrhea, bright red blood per rectum, melena, or hematemesis Neurologic: negative for visual changes, syncope, or dizziness All other systems reviewed and are otherwise negative except as noted above.    Blood pressure 140/78, pulse 75, height '5\' 11"'$  (1.803 m), weight 271 lb (122.925 kg), SpO2 95 %.  General appearance: alert, cooperative and no distress Neck: no carotid bruit and no JVD Lungs: bilateral wheezing diffusely, no rales Heart: regular rate and rhythm, S1, S2 normal, no murmur, click, rub or  gallop Extremities: 1+ LEE L>R Pulses: 2+ and symmetric Skin: warm and dry Neurologic: Grossly normal  EKG NSR. 76 bpm. QT/QTc 394/443 ms  ASSESSMENT AND PLAN:   1. Paroxsymal Atrial Flutter: he has a h/o flutter ablation x 2. He was in NSR in 06/2015 when he saw Dr. Burt Knack. He was symptomatic last PM and ED EKG showed atrial flutter with RVR. Patient noted symptoms resolved in ED and he left prior to seeing physician. EKG today shows NSR. HR is in the 70s. He denies any further symptoms. CBC and BMP in the ED were both unremarkable. We will check a Mg and TSH today. We will also have him wear  a 30 day heart monitor to assess for recurrent atrial flutter/fibrillation. If recurrence, will referr back to EP for further treatment recommendations given he has already had 2 ablations. Patient advised to avoid caffieen and ETOH to reduce recurrence. Continue metoprolol. If monitor shows frequent recurrence, will need to readdress anticoagulation. He is currently on ASA and Pletal for CAD/PVD.   2. CAD:  Outlined in detail above. Treated medically. Stable w/o chest pain. Continue medical therapy.   3. HTN: mildly elevated in the 040E systolic but has not yet taken morning meds.   4. HLD: on statin therapy with Crestor.   5. DM: followed by PCP.   6. COPD: breathing is stable. Still with mild wheezing on exam. Continue steroids and antibiotics. He has f/u with PCP next week. Smoking cessation advised.   PLAN  30 day heart monitor to assess for recurrent atrial flutter. Check Mg and TSH today. F/u with EP in 1 month  SIMMONS, BRITTAINY PA-C 11/15/2015 10:28 AM

## 2015-11-19 DIAGNOSIS — E1351 Other specified diabetes mellitus with diabetic peripheral angiopathy without gangrene: Secondary | ICD-10-CM | POA: Diagnosis not present

## 2015-11-19 DIAGNOSIS — L602 Onychogryphosis: Secondary | ICD-10-CM | POA: Diagnosis not present

## 2015-11-19 DIAGNOSIS — S80811A Abrasion, right lower leg, initial encounter: Secondary | ICD-10-CM | POA: Diagnosis not present

## 2015-11-19 DIAGNOSIS — I70293 Other atherosclerosis of native arteries of extremities, bilateral legs: Secondary | ICD-10-CM | POA: Diagnosis not present

## 2015-11-19 DIAGNOSIS — S80812A Abrasion, left lower leg, initial encounter: Secondary | ICD-10-CM | POA: Diagnosis not present

## 2015-11-20 ENCOUNTER — Telehealth: Payer: Self-pay | Admitting: *Deleted

## 2015-11-20 ENCOUNTER — Ambulatory Visit (INDEPENDENT_AMBULATORY_CARE_PROVIDER_SITE_OTHER): Payer: Medicare Other

## 2015-11-20 DIAGNOSIS — I480 Paroxysmal atrial fibrillation: Secondary | ICD-10-CM | POA: Diagnosis not present

## 2015-11-20 NOTE — Telephone Encounter (Signed)
[employer] called at 2:17 pm to report AFib w/ rate of 130. This turns out to be baseline transmission.  Patient was having monitor put on at the time. Patient has hx of AFib/AFL. Will continue to monitor.

## 2015-11-21 ENCOUNTER — Encounter: Payer: Self-pay | Admitting: Family

## 2015-11-21 ENCOUNTER — Ambulatory Visit (INDEPENDENT_AMBULATORY_CARE_PROVIDER_SITE_OTHER): Payer: Medicare Other | Admitting: Family

## 2015-11-21 VITALS — BP 134/80 | HR 85 | Temp 97.9°F | Resp 16 | Ht 71.0 in | Wt 274.0 lb

## 2015-11-21 DIAGNOSIS — J441 Chronic obstructive pulmonary disease with (acute) exacerbation: Secondary | ICD-10-CM | POA: Diagnosis not present

## 2015-11-21 NOTE — Progress Notes (Signed)
Subjective:    Patient ID: Neil Mam., male    DOB: October 22, 1950, 65 y.o.   MRN: [id number]  Chief Complaint  Patient presents with  . Hospitalization Follow-up    Pt states he feels better since being out of the hospital, wants you to take a look at his tongue    HPI:  Neil Munguia. is a 65 y.o. male who  has a past medical history of Other peripheral vascular disease(443.89); Hyperlipidemia; Hypertension; Transient global amnesia; Contact dermatitis and other eczema due to plants (except food); Tobacco use disorder; Atrial flutter ([place]); Type II diabetes mellitus ([place]); Chronic bronchitis; GERD (gastroesophageal reflux disease); LV dysfunction; Adenomatous colon polyp; COPD (chronic obstructive pulmonary disease) ([place]); Arthritis; Cataract; and Tuberculosis. and presents today for a hospital follow up.  Recently evaluated in the emergency department and admitted to the hospital for nasal congestion and shortness of breath. He was initially seen on 2/14 for nasal congestion and cough and started on doxycycline and prednisone, however he experienced dyspnea when lying flat which was improved with sitting up. He had no fevers, vomiting, or diarrhea. Physical exam with coarse breath sounds bilaterally and 2+ pitting edema. His EKG was consistent with sinus rhythm and ST elevation. He was also noted to be hypertensive at 168/96 and oxygen saturation of 89-92% on room air. Chest x-ray showed COPD changes but no acute abnormalities. He was treated with a DuoNeb and placed on supplemental oxygen. He continued to have oxygen requirements of 3 L to maintain saturation mid 90s. He was diagnosed with COPD exacerbation and treated with IV doxycycline and Solu-Medrol. After 2 days of treatment he had significant improvement and was placed on a prednisone taper as he was discharged. No changes are made to his type 2 diabetes with vascular disease or essential hypertension. He is advised to  follow-up with primary care. All hospital records, labs, and imaging were reviewed in detail.  Since leaving the hospital he reports that he has been feeling better. COPD is currently managed on Advair. Reports taking the medication as prescribed and denies adverse side effects. Does continue to have a moderate cough but notes that his symptoms are improved. He is able to complete his activity at baseline. No fevers or episodes of increased shortness of breath. Notes that his tongue generally has a slight white film over it and notes that it is sensitive to heat and there has been some redness. Reports those symptoms are improved.  Allergies  Allergen Reactions  . Niacin Nausea Only    REACTION: upset stomach     Current Outpatient Prescriptions on File Prior to Visit  Medication Sig Dispense Refill  . acetaminophen (TYLENOL) 650 MG CR tablet Take 650 mg by mouth every 8 (eight) hours as needed for pain.    [name] aspirin 325 MG tablet Take 325 mg by mouth daily.    . cilostazol (PLETAL) 100 MG tablet Take 1 tablet (100 mg total) by mouth 2 (two) times daily. 180 tablet 1  . clobetasol (OLUX) 0.05 % topical foam Apply topically 2 (two) times daily.   0  . doxycycline (VIBRA-TABS) 100 MG tablet Take 1 tablet (100 mg total) by mouth every 12 (twelve) hours. 10 tablet 0  . Flaxseed, Linseed, (FLAX SEED OIL) 1000 MG CAPS Take 1 capsule by mouth 2 (two) times daily.     . Fluticasone-Salmeterol (ADVAIR DISKUS) 100-50 MCG/DOSE AEPB inhale 1 dose by mouth twice a day (Patient taking differently: Inhale 1  puff into the lungs 2 (two) times daily. inhale 1 dose by mouth twice a day) 180 each 2  . furosemide (LASIX) 40 MG tablet Take 1 tablet (40 mg total) by mouth daily. 90 tablet 3  . hydrochlorothiazide (HYDRODIURIL) 25 MG tablet take 1 tablet by mouth once daily 90 tablet 3  . HYDROcodone-homatropine (HYCODAN) 5-1.5 MG/5ML syrup Take 5 mLs by mouth every 8 (eight) hours as needed for cough. 120 mL 0  .  Insulin Detemir (LEVEMIR FLEXTOUCH) 100 UNIT/ML Pen INJECT 24 UNITS INTO THE   SKIN DAILY 30 mL 1  . Insulin Pen Needle (NOVOFINE) 32G X 6 MM MISC Use 2 needles daily with Levimir pen and Victoza pen  DX: E11.69 100 each 5  . losartan (COZAAR) 100 MG tablet take 1 tablet by mouth once daily 30 tablet 10  . metformin (FORTAMET) 1000 MG (OSM) 24 hr tablet TAKE 1 TABLET TWICE A DAY 180 tablet 1  . metoprolol (LOPRESSOR) 100 MG tablet take 1 tablet by mouth twice a day 180 tablet 3  . Multiple Vitamins-Minerals (CENTRUM ADULTS) TABS Take 1 tablet by mouth daily.    . mupirocin ointment (BACTROBAN) 2 % Apply 1 application topically 2 (two) times daily as needed (affected areas).     . Omega-3 Fatty Acids (FISH OIL) 1000 MG CAPS Take 2 capsules by mouth 2 (two) times daily.     . predniSONE (DELTASONE) 10 MG tablet 30 MG X 3 DAYS, 20 MG X 3 DAYS, 10 MG X 3 DAYS. 18 tablet 0  . rosuvastatin (CRESTOR) 10 MG tablet Take 1 tablet (10 mg total) by mouth daily. 90 tablet 3  . tamsulosin (FLOMAX) 0.4 MG CAPS capsule Take 0.4 mg by mouth daily.     [name] triamcinolone cream (KENALOG) 0.1 % Apply 1 application topically 2 (two) times daily as needed (affected area).     [name] VICTOZA 18 MG/3ML SOPN INJECT 0.3ML (=1.'8MG'$ )      SUBCUTANEOUSLY DAILY 27 mL 1   No current facility-administered medications on file prior to visit.     Past Surgical History  Procedure Laterality Date  . Partial hip arthroplasty Right 01/2000    "Right; replaced ball & stem"  . Partial hip arthroplasty Left   . Cystoscopy  11/2007    Dr Jeffie Pollock  . Cardiac electrophysiology mapping and ablation  03/2010  . Tee without cardioversion  12/28/2011    Procedure: TRANSESOPHAGEAL ECHOCARDIOGRAM (TEE);  Surgeon: Larey Dresser, MD;  Location: [facility];  Service: Cardiovascular;  Laterality: N/A;  . Colonoscopy with polypectomy  2006 & 2011    Dr [name]  . A flutter ablation N/A 12/28/2011    Procedure: ABLATION A FLUTTER;  Surgeon: Evans Lance, MD;  Location: [facility] CATH LAB;  Service: Cardiovascular;  Laterality: N/A;  . Colonoscopy    . Polypectomy    . Cardiac catheterization N/A 07/10/2015    Procedure: Left Heart Cath and Coronary Angiography;  Surgeon: Sherren Mocha, MD;  Location: [facility] CV LAB;  Service: Cardiovascular;  Laterality: N/A;     Past Medical History  Diagnosis Date  . Other peripheral vascular disease(443.89)     bilateral lower extremity  . Hyperlipidemia   . Hypertension   . Transient global amnesia   . Contact dermatitis and other eczema due to plants (except food)   . Tobacco use disorder     dependent  . Atrial flutter ([place])     onset  2011. s/p EPS/RFA 12/2011  .  Type II diabetes mellitus ([place])   . Chronic bronchitis   . GERD (gastroesophageal reflux disease)   . LV dysfunction     EF 45-50% 12/2011  . Adenomatous colon polyp   . COPD (chronic obstructive pulmonary disease) ([place])   . Arthritis     left hip replacement  . Cataract   . Tuberculosis      Review of Systems  Constitutional: Negative for fever and chills.  Respiratory: Positive for cough. Negative for chest tightness and shortness of breath.   Cardiovascular: Negative for chest pain, palpitations and leg swelling.      Objective:    BP 134/80 mmHg  Pulse 85  Temp(Src) 97.9 F (36.6 C) (Oral)  Resp 16  Ht '5\' 11"'$  (1.803 m)  Wt 274 lb (124.286 kg)  BMI 38.23 kg/m2  SpO2 93% Nursing note and vital signs reviewed.  Physical Exam  Constitutional: He is oriented to person, place, and time. He appears well-developed and well-nourished. No distress.  Cardiovascular: Normal rate, regular rhythm, normal heart sounds and intact distal pulses.   Pulmonary/Chest: Effort normal. He has wheezes.  Neurological: He is alert and oriented to person, place, and time.  Skin: Skin is warm and dry.  Psychiatric: He has a normal mood and affect. His behavior is normal. Judgment and thought content normal.       Assessment &  Plan:   Problem List Items Addressed This Visit      Respiratory   COPD exacerbation ([place]) - Primary    Symptoms of COPD exacerbation appear to be adequately controlled with previous treatment. Continue current dosage of Advair as prescribed. Consider possible Xopenex as needed for wheezing. Encouraged to quit smoking. Follow up if symptoms worsen or are no longer controlled.

## 2015-11-21 NOTE — Assessment & Plan Note (Signed)
Symptoms of COPD exacerbation appear to be adequately controlled with previous treatment. Continue current dosage of Advair as prescribed. Consider possible Xopenex as needed for wheezing. Encouraged to quit smoking. Follow up if symptoms worsen or are no longer controlled.

## 2015-11-21 NOTE — Patient Instructions (Signed)
Thank you for choosing [facility].  Summary/Instructions:  Please continue to take your medications as prescribed.   Your prescription(s) have been submitted to your pharmacy or been printed and provided for you. Please take as directed and contact our office if you believe you are having problem(s) with the medication(s) or have any questions.  If your symptoms worsen or fail to improve, please contact our office for further instruction, or in case of emergency go directly to the emergency room at the closest medical facility.

## 2015-11-29 DIAGNOSIS — I83028 Varicose veins of left lower extremity with ulcer other part of lower leg: Secondary | ICD-10-CM | POA: Diagnosis not present

## 2015-11-29 DIAGNOSIS — L03119 Cellulitis of unspecified part of limb: Secondary | ICD-10-CM | POA: Diagnosis not present

## 2015-11-29 DIAGNOSIS — I83018 Varicose veins of right lower extremity with ulcer other part of lower leg: Secondary | ICD-10-CM | POA: Diagnosis not present

## 2015-11-29 DIAGNOSIS — R600 Localized edema: Secondary | ICD-10-CM | POA: Diagnosis not present

## 2015-12-02 DIAGNOSIS — R600 Localized edema: Secondary | ICD-10-CM | POA: Diagnosis not present

## 2015-12-02 DIAGNOSIS — I83018 Varicose veins of right lower extremity with ulcer other part of lower leg: Secondary | ICD-10-CM | POA: Diagnosis not present

## 2015-12-02 DIAGNOSIS — I83028 Varicose veins of left lower extremity with ulcer other part of lower leg: Secondary | ICD-10-CM | POA: Diagnosis not present

## 2015-12-11 DIAGNOSIS — R600 Localized edema: Secondary | ICD-10-CM | POA: Diagnosis not present

## 2015-12-11 DIAGNOSIS — E1151 Type 2 diabetes mellitus with diabetic peripheral angiopathy without gangrene: Secondary | ICD-10-CM | POA: Diagnosis not present

## 2015-12-11 DIAGNOSIS — I83018 Varicose veins of right lower extremity with ulcer other part of lower leg: Secondary | ICD-10-CM | POA: Diagnosis not present

## 2015-12-11 DIAGNOSIS — I83028 Varicose veins of left lower extremity with ulcer other part of lower leg: Secondary | ICD-10-CM | POA: Diagnosis not present

## 2015-12-18 ENCOUNTER — Encounter: Payer: Self-pay | Admitting: Cardiology

## 2015-12-18 DIAGNOSIS — I83018 Varicose veins of right lower extremity with ulcer other part of lower leg: Secondary | ICD-10-CM | POA: Diagnosis not present

## 2015-12-18 DIAGNOSIS — E1151 Type 2 diabetes mellitus with diabetic peripheral angiopathy without gangrene: Secondary | ICD-10-CM | POA: Diagnosis not present

## 2015-12-18 DIAGNOSIS — R600 Localized edema: Secondary | ICD-10-CM | POA: Diagnosis not present

## 2015-12-18 DIAGNOSIS — I83028 Varicose veins of left lower extremity with ulcer other part of lower leg: Secondary | ICD-10-CM | POA: Diagnosis not present

## 2015-12-24 ENCOUNTER — Encounter: Payer: Medicare Other | Admitting: Cardiology

## 2015-12-30 ENCOUNTER — Encounter: Payer: Self-pay | Admitting: Internal Medicine

## 2015-12-30 ENCOUNTER — Ambulatory Visit (INDEPENDENT_AMBULATORY_CARE_PROVIDER_SITE_OTHER): Payer: Medicare Other | Admitting: Internal Medicine

## 2015-12-30 VITALS — BP 132/78 | HR 101 | Temp 98.2°F | Resp 20 | Ht 71.0 in | Wt 271.0 lb

## 2015-12-30 DIAGNOSIS — R600 Localized edema: Secondary | ICD-10-CM | POA: Diagnosis not present

## 2015-12-30 DIAGNOSIS — I83028 Varicose veins of left lower extremity with ulcer other part of lower leg: Secondary | ICD-10-CM | POA: Diagnosis not present

## 2015-12-30 DIAGNOSIS — Z720 Tobacco use: Secondary | ICD-10-CM

## 2015-12-30 DIAGNOSIS — I10 Essential (primary) hypertension: Secondary | ICD-10-CM

## 2015-12-30 DIAGNOSIS — F17200 Nicotine dependence, unspecified, uncomplicated: Secondary | ICD-10-CM

## 2015-12-30 DIAGNOSIS — I83018 Varicose veins of right lower extremity with ulcer other part of lower leg: Secondary | ICD-10-CM | POA: Diagnosis not present

## 2015-12-30 DIAGNOSIS — E1151 Type 2 diabetes mellitus with diabetic peripheral angiopathy without gangrene: Secondary | ICD-10-CM | POA: Diagnosis not present

## 2015-12-30 DIAGNOSIS — J449 Chronic obstructive pulmonary disease, unspecified: Secondary | ICD-10-CM

## 2015-12-30 MED ORDER — HYDROCODONE-HOMATROPINE 5-1.5 MG/5ML PO SYRP: 5.0000 mL | ORAL_SOLUTION | Freq: Three times a day (TID) | ORAL | Status: DC | PRN

## 2015-12-30 NOTE — Assessment & Plan Note (Signed)
BP at goal on lasix, losartan, metoprolol. Last CMP within goals and no indication for recheck today.

## 2015-12-30 NOTE — Assessment & Plan Note (Signed)
Lungs are with some wheezing and new rhonchi on the left. He does not wish to initiate treatment now as his breathing is good. Rx for hycodan for the coughing and if breathing worsens or does not improve he will call and we will initiate antibiotics.

## 2015-12-30 NOTE — Progress Notes (Signed)
   Subjective:    Patient ID: Neil Mam., male    DOB: August 05, 1951, 65 y.o.   MRN: [id number]  HPI The patient is a 65 YO man coming in for swelling in his feet. We had switched him from hctz to lasix at last visit 3 months ago. He states that he made the switch and is urinating more. Swelling overall improved. He is getting his legs wrapped regularly which has helped the most. No pain in his legs. Still some swelling and he does have some chronic ulcers which are unchanged. No redness or rash on his legs. Coughing extra today and breathing is about normal. He does not usually do too much though so does not exert himself much.   Review of Systems  Constitutional: Positive for activity change. Negative for fever, chills, appetite change, fatigue and unexpected weight change.  HENT: Positive for congestion. Negative for ear discharge, ear pain, rhinorrhea, sinus pressure, sore throat and trouble swallowing.   Eyes: Negative.   Respiratory: Positive for cough and shortness of breath. Negative for chest tightness and wheezing.   Cardiovascular: Positive for leg swelling. Negative for chest pain and palpitations.  Gastrointestinal: Negative for nausea, abdominal pain, diarrhea, constipation and abdominal distention.  Musculoskeletal: Negative for myalgias, back pain and arthralgias.  Skin: Positive for color change. Negative for rash.  Neurological: Negative.   Psychiatric/Behavioral: Negative.       Objective:   Physical Exam  Constitutional: He is oriented to person, place, and time. He appears well-developed and well-nourished.  Overweight  HENT:  Head: Normocephalic and atraumatic.  Eyes: EOM are normal.  Neck: Normal range of motion. No JVD present.  Cardiovascular: Normal rate and regular rhythm.   Pulmonary/Chest: Effort normal. No respiratory distress. He has wheezes. He has no rales.  Expiratory wheeze, mild rhonchi on the left  Abdominal: Soft. Bowel sounds are normal.  He exhibits no distension. There is no tenderness. There is no rebound.  Musculoskeletal: He exhibits edema.  1-2 + edema in the legs, with color change consistent with venous stasis. Several ulcers which are not open.  Lymphadenopathy:    He has no cervical adenopathy.  Neurological: He is alert and oriented to person, place, and time. Coordination normal.  Skin: Skin is warm and dry.   Filed Vitals:   12/30/15 1329  BP: 132/78  Pulse: 101  Temp: 98.2 F (36.8 C)  TempSrc: Oral  Resp: 20  Height: '5\' 11"'$  (1.803 m)  Weight: 271 lb (122.925 kg)  SpO2: 92%      Assessment & Plan:

## 2015-12-30 NOTE — Patient Instructions (Signed)
We have given you another prescription for the cough syrup to use if needed.   If the breathing does not improve this week or worsens please call the office and we will treat you with antibiotics.   Work on quitting smoking as they are damaging your lungs and can cause you worsening problems.

## 2015-12-30 NOTE — Assessment & Plan Note (Signed)
Have reminded him to quit in order to avoid worsening health. He is not sure he wants to do that now.

## 2015-12-30 NOTE — Progress Notes (Signed)
Pre visit review using our clinic review tool, if applicable. No additional management support is needed unless otherwise documented below in the visit note. 

## 2016-01-02 NOTE — Progress Notes (Signed)
Electrophysiology Office Note   Date:  01/03/2016   ID:  Neil Granillo., DOB [birth year], MRN [id number]  PCP:  Hoyt Koch, MD  Cardiologist:  Burt Knack Primary Electrophysiologist:  Neil Cimino Meredith Leeds, MD    Chief Complaint  Patient presents with  . Atrial Fibrillation     History of Present Illness: Neil Aydelott. is a 66 y.o. male who presents today for electrophysiology evaluation.   She has a history of atrial flutter status post redo frequency ablation, peripheral arterial disease with known SFA occlusive disease bilaterally, HTN, DM, COPD, tobacco use and CAD. He recently underwent a [place] by Dr. Burt Knack 06/2015. This showed severe distal left circumflex stenosis - recommend medical therapy as small amount of myocardium supplied, mild nonobstructive stenosis of a large, wrap around LAD and total occlusion of a small, codominant RCA. Dr. Burt Knack did not feel that PCI would significantly impact symptoms or overall clinical outcome, thus medical therapy was elected. He has been maintained on Plavix, metoprolol, Crestor and Losartan. He also takes Pletal for his PVD.   He presented back to the ED 2/23 with a complaint of palpitations. EKG showed atrial flutter with a RVR of 134 bpm. CBC was negative for anemia. BMP showed normal K and renal function. K was 4.8. He reports that he left the ED before being seen by an ED physician as his symptoms resolved and he was feeling better.  His EKG at the time showed likely atypical atrial flutter. Since that time, he is worn a cardiac monitor which showed episodes of atrial fibrillation. He does say that he has been fatigued, but this is been going on for many years. He did not notice palpitations at the time of his event monitor.  Today, he denies symptoms of palpitations, orthopnea, PND, lower extremity edema, claudication, dizziness, presyncope, syncope, bleeding, or neurologic sequela. The patient is tolerating medications  without difficulties and is otherwise without complaint today.    Past Medical History  Diagnosis Date  . Other peripheral vascular disease(443.89)     bilateral lower extremity  . Hyperlipidemia   . Hypertension   . Transient global amnesia   . Contact dermatitis and other eczema due to plants (except food)   . Tobacco use disorder     dependent  . Atrial flutter ([place])     onset  2011. s/p EPS/RFA 12/2011  . Type II diabetes mellitus ([place])   . Chronic bronchitis   . GERD (gastroesophageal reflux disease)   . LV dysfunction     EF 45-50% 12/2011  . Adenomatous colon polyp   . COPD (chronic obstructive pulmonary disease) ([place])   . Arthritis     left hip replacement  . Cataract   . Tuberculosis    Past Surgical History  Procedure Laterality Date  . Partial hip arthroplasty Right 01/2000    "Right; replaced ball & stem"  . Partial hip arthroplasty Left   . Cystoscopy  11/2007    Dr Jeffie Pollock  . Cardiac electrophysiology mapping and ablation  03/2010  . Tee without cardioversion  12/28/2011    Procedure: TRANSESOPHAGEAL ECHOCARDIOGRAM (TEE);  Surgeon: Larey Dresser, MD;  Location: [facility];  Service: Cardiovascular;  Laterality: N/A;  . Colonoscopy with polypectomy  2006 & 2011    Dr [name]  . A flutter ablation N/A 12/28/2011    Procedure: ABLATION A FLUTTER;  Surgeon: Evans Lance, MD;  Location: [facility] CATH LAB;  Service: Cardiovascular;  Laterality: N/A;  .  Colonoscopy    . Polypectomy    . Cardiac catheterization N/A 07/10/2015    Procedure: Left Heart Cath and Coronary Angiography;  Surgeon: Sherren Mocha, MD;  Location: [facility] CV LAB;  Service: Cardiovascular;  Laterality: N/A;     Current Outpatient Prescriptions  Medication Sig Dispense Refill  . acetaminophen (TYLENOL) 650 MG CR tablet Take 650 mg by mouth every 8 (eight) hours as needed for pain.    [name] aspirin 325 MG tablet Take 325 mg by mouth daily.    . cilostazol (PLETAL) 100 MG tablet Take 1 tablet (100 mg  total) by mouth 2 (two) times daily. 180 tablet 1  . clobetasol (OLUX) 0.05 % topical foam Apply topically 2 (two) times daily.   0  . Flaxseed, Linseed, (FLAX SEED OIL) 1000 MG CAPS Take 1 capsule by mouth 2 (two) times daily.     . Fluticasone-Salmeterol (ADVAIR) 100-50 MCG/DOSE AEPB Inhale 1 puff into the lungs 2 (two) times daily.    . furosemide (LASIX) 40 MG tablet Take 1 tablet (40 mg total) by mouth daily. 90 tablet 3  . HYDROcodone-homatropine (HYCODAN) 5-1.5 MG/5ML syrup Take 5 mLs by mouth every 8 (eight) hours as needed for cough. 120 mL 0  . Insulin Detemir (LEVEMIR FLEXTOUCH) 100 UNIT/ML Pen INJECT 24 UNITS INTO THE   SKIN DAILY 30 mL 1  . Insulin Pen Needle (NOVOFINE) 32G X 6 MM MISC Use 2 needles daily with Levimir pen and Victoza pen  DX: E11.69 100 each 5  . losartan (COZAAR) 100 MG tablet take 1 tablet by mouth once daily 30 tablet 10  . metformin (FORTAMET) 1000 MG (OSM) 24 hr tablet TAKE 1 TABLET TWICE A DAY 180 tablet 1  . metoprolol (LOPRESSOR) 100 MG tablet take 1 tablet by mouth twice a day 180 tablet 3  . Multiple Vitamins-Minerals (CENTRUM ADULTS) TABS Take 1 tablet by mouth daily.    . mupirocin ointment (BACTROBAN) 2 % Apply 1 application topically 2 (two) times daily as needed (affected areas).     . Omega-3 Fatty Acids (FISH OIL) 1000 MG CAPS Take 2 capsules by mouth 2 (two) times daily.     . rosuvastatin (CRESTOR) 10 MG tablet Take 1 tablet (10 mg total) by mouth daily. 90 tablet 3  . tamsulosin (FLOMAX) 0.4 MG CAPS capsule Take 0.4 mg by mouth daily.     [name] triamcinolone cream (KENALOG) 0.1 % Apply 1 application topically 2 (two) times daily as needed (affected area).     [name] VICTOZA 18 MG/3ML SOPN INJECT 0.3ML (=1.'8MG'$ )      SUBCUTANEOUSLY DAILY 27 mL 1  . rivaroxaban (XARELTO) 20 MG TABS tablet Take 1 tablet (20 mg total) by mouth daily with supper. 30 tablet 11   No current facility-administered medications for this visit.    Allergies:   Niacin   Social  History:  The patient  reports that he has been smoking Cigarettes.  He has a 44 pack-year smoking history. He has never used smokeless tobacco. He reports that he drinks alcohol. He reports that he does not use illicit drugs.   Family History:  The patient's family history includes Crohn's disease in his son; Diabetes in his brother, brother, mother, and paternal grandmother; Heart attack (age of onset: 57) in his brother; Hypertension in his mother; Lung cancer in his father; Stroke (age of onset: 62) in his mother; Throat cancer in his paternal uncle. There is no history of Colon cancer, Esophageal cancer, Rectal  cancer, or Stomach cancer.    ROS:  Please see the history of present illness.   Otherwise, review of systems is positive for leg swelling, SOB, chest pressure, anxiety, snoring, wheezing.   All other systems are reviewed and negative.    PHYSICAL EXAM: VS:  BP 128/84 mmHg  Pulse 84  Ht 5' 10.5" (1.791 m)  Wt 275 lb 8 oz (124.966 kg)  BMI 38.96 kg/m2 , BMI Body mass index is 38.96 kg/(m^2). GEN: Well nourished, well developed, in no acute distress HEENT: normal Neck: no JVD, carotid bruits, or masses Cardiac: RRR; no murmurs, rubs, or gallops,no edema  Respiratory:  Course throughout GI: soft, nontender, nondistended, + BS MS: no deformity or atrophy Skin: warm and dry Neuro:  Strength and sensation are intact Psych: euthymic mood, full affect  EKG:  EKG is ordered today. The ekg ordered today shows sinus rhythm, incomplete RBBB, rate 85  Recent Labs: 09/19/2015: ALT 18 11/10/2015: B Natriuretic Peptide 52.1 11/14/2015: BUN 18; Creatinine, Ser 1.33*; Hemoglobin 17.1*; Platelets 173; Potassium 4.8; Sodium 139 11/15/2015: Magnesium 2.1; TSH 0.84    Lipid Panel     Component Value Date/Time   CHOL 110 05/29/2015 1116   TRIG 96.0 05/29/2015 1116   HDL 38.20* 05/29/2015 1116   CHOLHDL 3 05/29/2015 1116   VLDL 19.2 05/29/2015 1116   LDLCALC 53 05/29/2015 1116     Wt  Readings from Last 3 Encounters:  01/03/16 275 lb 8 oz (124.966 kg)  12/30/15 271 lb (122.925 kg)  11/21/15 274 lb (124.286 kg)      Other studies Reviewed: Additional studies/ records that were reviewed today include: 30 day monitor 11/20/15  Review of the above records today demonstrates:  Sinus rhythm and atrial fibrillation No SVT or VT noted.   ASSESSMENT AND PLAN:  1.  Atrial fibrillation/atypical atrial flutter: Has had both atrial fibrillation and what appears to be atypical atrial flutter over the last few months. He is currently not anticoagulated and therefore we'll start him on rivaroxaban today. He has not had an echo since 2013, and we'll order one today. He also has a history of snoring, and therefore we'll order a sleep study to see if he has sleep apnea.He is still smoking at this time does not have any interest in quitting. I discussed with him the options of medical management for his atrial fibrillation. He is not able to take flecainide, but would be able to take amiodarone or dofetilide. He says that he would rather discuss this 3 months down the road.   This patients CHA2DS2-VASc Score and unadjusted Ischemic Stroke Rate (% per year) is equal to 3.2 % stroke rate/year from a score of 3  Above score calculated as 1 point each if present [CHF, HTN, DM, Vascular=MI/PAD/Aortic Plaque, Age if 65-74, or Male] Above score calculated as 2 points each if present [Age > 75, or Stroke/TIA/TE]  2. Hypertension: well controlled  3. Hyperlipidemia: on Crestor  Current medicines are reviewed at length with the patient today.   The patient does not have concerns regarding his medicines.  The following changes were made today:  Xarelto  Labs/ tests ordered today include:  Orders Placed This Encounter  Procedures  . EKG 12-Lead  . ECHOCARDIOGRAM COMPLETE  . Split night study     Disposition:   FU with Neil Carroll 3 months  Signed, Neil Pokorny Meredith Leeds, MD  01/03/2016  11:06 AM     [facility] [address]  [place] [address] [phone] (office) [phone] (fax)

## 2016-01-03 ENCOUNTER — Ambulatory Visit (INDEPENDENT_AMBULATORY_CARE_PROVIDER_SITE_OTHER): Payer: Medicare Other | Admitting: Cardiology

## 2016-01-03 ENCOUNTER — Encounter: Payer: Self-pay | Admitting: Cardiology

## 2016-01-03 VITALS — BP 128/84 | HR 84 | Ht 70.5 in | Wt 275.5 lb

## 2016-01-03 DIAGNOSIS — I480 Paroxysmal atrial fibrillation: Secondary | ICD-10-CM

## 2016-01-03 MED ORDER — RIVAROXABAN 20 MG PO TABS: 20.0000 mg | ORAL_TABLET | Freq: Every day | ORAL | Status: DC

## 2016-01-03 NOTE — Patient Instructions (Addendum)
Medication Instructions:  Your physician recommends that you schedule a follow-up appointment in:  1) Start Xarelto 20 mg daily 2) Stop Aspirin 325 mg    Labwork: None ordered   Testing/Procedures: Your physician has requested that you have an echocardiogram. Echocardiography is a painless test that uses sound waves to create images of your heart. It provides your doctor with information about the size and shape of your heart and how well your heart's chambers and valves are working. This procedure takes approximately one hour. There are no restrictions for this procedure.  Your physician has recommended that you have a sleep study. This test records several body functions during sleep, including: brain activity, eye movement, oxygen and carbon dioxide blood levels, heart rate and rhythm, breathing rate and rhythm, the flow of air through your mouth and nose, snoring, body muscle movements, and chest and belly movement.    Follow-Up: Your physician recommends that you schedule a follow-up appointment in: 3 months with Dr Curt Bears   Any Other Special Instructions Will Be Listed Below (If Applicable).     If you need a refill on your cardiac medications before your next appointment, please call your pharmacy.

## 2016-01-13 DIAGNOSIS — I83018 Varicose veins of right lower extremity with ulcer other part of lower leg: Secondary | ICD-10-CM | POA: Diagnosis not present

## 2016-01-13 DIAGNOSIS — E1151 Type 2 diabetes mellitus with diabetic peripheral angiopathy without gangrene: Secondary | ICD-10-CM | POA: Diagnosis not present

## 2016-01-13 DIAGNOSIS — I83028 Varicose veins of left lower extremity with ulcer other part of lower leg: Secondary | ICD-10-CM | POA: Diagnosis not present

## 2016-01-13 DIAGNOSIS — R600 Localized edema: Secondary | ICD-10-CM | POA: Diagnosis not present

## 2016-01-20 DIAGNOSIS — R600 Localized edema: Secondary | ICD-10-CM | POA: Diagnosis not present

## 2016-01-20 DIAGNOSIS — I83018 Varicose veins of right lower extremity with ulcer other part of lower leg: Secondary | ICD-10-CM | POA: Diagnosis not present

## 2016-01-20 DIAGNOSIS — E1151 Type 2 diabetes mellitus with diabetic peripheral angiopathy without gangrene: Secondary | ICD-10-CM | POA: Diagnosis not present

## 2016-01-20 DIAGNOSIS — I83028 Varicose veins of left lower extremity with ulcer other part of lower leg: Secondary | ICD-10-CM | POA: Diagnosis not present

## 2016-01-21 ENCOUNTER — Ambulatory Visit (HOSPITAL_COMMUNITY): Payer: Medicare Other | Attending: Internal Medicine

## 2016-01-21 ENCOUNTER — Other Ambulatory Visit: Payer: Self-pay

## 2016-01-21 ENCOUNTER — Other Ambulatory Visit (INDEPENDENT_AMBULATORY_CARE_PROVIDER_SITE_OTHER): Payer: Medicare Other

## 2016-01-21 DIAGNOSIS — I340 Nonrheumatic mitral (valve) insufficiency: Secondary | ICD-10-CM | POA: Insufficient documentation

## 2016-01-21 DIAGNOSIS — I071 Rheumatic tricuspid insufficiency: Secondary | ICD-10-CM | POA: Insufficient documentation

## 2016-01-21 DIAGNOSIS — E669 Obesity, unspecified: Secondary | ICD-10-CM | POA: Diagnosis not present

## 2016-01-21 DIAGNOSIS — I358 Other nonrheumatic aortic valve disorders: Secondary | ICD-10-CM | POA: Insufficient documentation

## 2016-01-21 DIAGNOSIS — Z6839 Body mass index (BMI) 39.0-39.9, adult: Secondary | ICD-10-CM | POA: Diagnosis not present

## 2016-01-21 DIAGNOSIS — I10 Essential (primary) hypertension: Secondary | ICD-10-CM | POA: Insufficient documentation

## 2016-01-21 DIAGNOSIS — I4892 Unspecified atrial flutter: Secondary | ICD-10-CM | POA: Diagnosis not present

## 2016-01-21 DIAGNOSIS — I480 Paroxysmal atrial fibrillation: Secondary | ICD-10-CM | POA: Insufficient documentation

## 2016-01-21 DIAGNOSIS — E119 Type 2 diabetes mellitus without complications: Secondary | ICD-10-CM | POA: Insufficient documentation

## 2016-01-21 DIAGNOSIS — E785 Hyperlipidemia, unspecified: Secondary | ICD-10-CM | POA: Diagnosis not present

## 2016-01-21 DIAGNOSIS — E1169 Type 2 diabetes mellitus with other specified complication: Secondary | ICD-10-CM

## 2016-01-21 DIAGNOSIS — I4891 Unspecified atrial fibrillation: Secondary | ICD-10-CM | POA: Diagnosis present

## 2016-01-21 DIAGNOSIS — I351 Nonrheumatic aortic (valve) insufficiency: Secondary | ICD-10-CM | POA: Insufficient documentation

## 2016-01-21 LAB — BASIC METABOLIC PANEL
BUN: 10 mg/dL (ref 6–23)
CALCIUM: 9.2 mg/dL (ref 8.4–10.5)
CO2: 27 mEq/L (ref 19–32)
Chloride: 108 mEq/L (ref 96–112)
Creatinine, Ser: 1.24 mg/dL (ref 0.40–1.50)
GFR: 75.07 mL/min (ref >60.00)
GLUCOSE: 126 mg/dL — AB (ref 70–99)
Potassium: 4.3 mEq/L (ref 3.5–5.1)
SODIUM: 143 meq/L (ref 135–145)

## 2016-01-21 LAB — MICROALBUMIN / CREATININE URINE RATIO
CREATININE, U: 299.3 mg/dL
MICROALB/CREAT RATIO: 17.1 mg/g (ref 0.0–30.0)
Microalb, Ur: 51.3 mg/dL — ABNORMAL HIGH (ref 0.0–1.9)

## 2016-01-21 LAB — HEMOGLOBIN A1C: HEMOGLOBIN A1C: 7.1 % — AB (ref 4.6–6.5)

## 2016-01-23 ENCOUNTER — Encounter: Payer: Self-pay | Admitting: Cardiology

## 2016-01-23 NOTE — Telephone Encounter (Signed)
New Message:  Pt is calling back to speak with Sherri about the results to his Echo. Please f/u with pt

## 2016-01-24 ENCOUNTER — Ambulatory Visit (INDEPENDENT_AMBULATORY_CARE_PROVIDER_SITE_OTHER): Payer: Medicare Other | Admitting: Endocrinology

## 2016-01-24 ENCOUNTER — Encounter: Payer: Self-pay | Admitting: Endocrinology

## 2016-01-24 VITALS — BP 128/62 | HR 110 | Temp 98.7°F | Ht 70.5 in | Wt 270.0 lb

## 2016-01-24 DIAGNOSIS — Z794 Long term (current) use of insulin: Secondary | ICD-10-CM

## 2016-01-24 DIAGNOSIS — E1165 Type 2 diabetes mellitus with hyperglycemia: Secondary | ICD-10-CM

## 2016-01-24 NOTE — Progress Notes (Signed)
Patient ID: Neil Tagliaferro., male   DOB: 1951/02/25, 65 y.o.   MRN: [id number]   Reason for Appointment : Followup for Type 2 Diabetes  History of Present Illness          Diagnosis: Type 2 diabetes mellitus, date of diagnosis:  2008     Past history: He has been treated with metformin only for several years and had relatively good control until about a year ago. However had been taking only 1000 mg of metformin. Because of his progressively higher blood sugars he had been started on Levemir insulin since 6/14.  However with this his A1c was still 10.2 in 9/14 and he was referred here With increasing Victoza to 1.8 mg in 3/15 his weight had come down and his blood sugars had improved   Recent history:   INSULIN regimen is described as:  Levemir 24 units at 5 pm  His A1c is about the same at 7.1 although has been as low as 6.7 and 2016  Current management, blood sugar patterns and problems identified:  He has had some intercurrent illnesses including bronchitis and he says that his sugars were very high with prednisone in February but he did not call or increase his insulin  Although he has lost weight this is likely to be from less edema.  He is not exercising now and has not been motivated  He thinks his diet is fairly good usually  He does not check his sugars after his evening meal usually does not appear to have high postprandial readings even with not taking any mealtime coverage He is still taking Victoza 1.8 mg without side effects.   He takes  Victoza in the late afternoon with his LEVEMIR.  He thinks he remembers to take the injections better in the afternoon    Oral hypoglycemic drugs the patient is taking are: Metformin 2g Side effects from medications have been: None Compliance with the medical regimen: Fair   Glucose monitoring: Irregular        Glucometer: One Touch.      Blood Glucose readings   Mean values apply above for all meters except  median for One Touch  PRE-MEAL Fasting Lunch Dinner Bedtime Overall  Glucose range: 114-157   103-133     Mean/median:     126    POST-MEAL PC Breakfast PC Lunch PC Dinner  Glucose range:   136   Mean/median:      Hypoglycemia frequency: Never.           Self-care:   Meals: 2/day. 11 am 6 pm.  He is eating out rarely Physical activity: exercise: none        Dietician visit: Most recent: 09/2013                Retinal exam: Most recent: 2 years ago.   Wt Readings from Last 3 Encounters:  01/24/16 270 lb (122.471 kg)  01/03/16 275 lb 8 oz (124.966 kg)  12/30/15 271 lb (122.925 kg)       Lab Results  Component Value Date   HGBA1C 7.1* 01/21/2016   HGBA1C 7.0* 11/10/2015   HGBA1C 7.0* 09/19/2015   Lab Results  Component Value Date   MICROALBUR 51.3* 01/21/2016   LDLCALC 53 05/29/2015   CREATININE 1.24 01/21/2016   Appointment on 01/21/2016  Component Date Value Ref Range Status  . Hgb A1c MFr Bld 01/21/2016 7.1* 4.6 - 6.5 %  Final   Glycemic Control Guidelines for People with Diabetes:Non Diabetic:  <6%Goal of Therapy: <7%Additional Action Suggested:  >8%   . Sodium 01/21/2016 143  135 - 145 mEq/L Final  . Potassium 01/21/2016 4.3  3.5 - 5.1 mEq/L Final  . Chloride 01/21/2016 108  96 - 112 mEq/L Final  . CO2 01/21/2016 27  19 - 32 mEq/L Final  . Glucose, Bld 01/21/2016 126* 70 - 99 mg/dL Final  . BUN 01/21/2016 10  6 - 23 mg/dL Final  . Creatinine, Ser 01/21/2016 1.24  0.40 - 1.50 mg/dL Final  . Calcium 01/21/2016 9.2  8.4 - 10.5 mg/dL Final  . GFR 01/21/2016 75.07  >60.00 mL/min Final  . Microalb, Ur 01/21/2016 51.3* 0.0 - 1.9 mg/dL Final  . Creatinine,U 01/21/2016 299.3   Final  . Microalb Creat Ratio 01/21/2016 17.1  0.0 - 30.0 mg/g Final       Medication List       This list is accurate as of: 01/24/16 11:24 AM.  Always use your most recent med list.               acetaminophen 650 MG CR tablet  Commonly known as:  TYLENOL  Take 650 mg by mouth every 8  (eight) hours as needed for pain.     CENTRUM ADULTS Tabs  Take 1 tablet by mouth daily.     cilostazol 100 MG tablet  Commonly known as:  PLETAL  Take 1 tablet (100 mg total) by mouth 2 (two) times daily.     clobetasol 0.05 % topical foam  Commonly known as:  OLUX  Apply topically 2 (two) times daily.     Fish Oil 1000 MG Caps  Take 2 capsules by mouth 2 (two) times daily.     Flax Seed Oil 1000 MG Caps  Take 1 capsule by mouth 2 (two) times daily.     Fluticasone-Salmeterol 100-50 MCG/DOSE Aepb  Commonly known as:  ADVAIR  Inhale 1 puff into the lungs 2 (two) times daily.     furosemide 40 MG tablet  Commonly known as:  LASIX  Take 1 tablet (40 mg total) by mouth daily.     HYDROcodone-homatropine 5-1.5 MG/5ML syrup  Commonly known as:  HYCODAN  Take 5 mLs by mouth every 8 (eight) hours as needed for cough.     Insulin Detemir 100 UNIT/ML Pen  Commonly known as:  LEVEMIR FLEXTOUCH  INJECT 24 UNITS INTO THE   SKIN DAILY     Insulin Pen Needle 32G X 6 MM Misc  Commonly known as:  NOVOFINE  Use 2 needles daily with Levimir pen and Victoza pen  DX: E11.69     losartan 100 MG tablet  Commonly known as:  COZAAR  take 1 tablet by mouth once daily     metformin 1000 MG (OSM) 24 hr tablet  Commonly known as:  FORTAMET  TAKE 1 TABLET TWICE A DAY     metoprolol 100 MG tablet  Commonly known as:  LOPRESSOR  take 1 tablet by mouth twice a day     mupirocin ointment 2 %  Commonly known as:  BACTROBAN  Apply 1 application topically 2 (two) times daily as needed (affected areas).     rivaroxaban 20 MG Tabs tablet  Commonly known as:  XARELTO  Take 1 tablet (20 mg total) by mouth daily with supper.     rosuvastatin 10 MG tablet  Commonly known as:  CRESTOR  Take 1 tablet (  10 mg total) by mouth daily.     tamsulosin 0.4 MG Caps capsule  Commonly known as:  FLOMAX  Take 0.4 mg by mouth daily.     triamcinolone cream 0.1 %  Commonly known as:  KENALOG  Apply 1  application topically 2 (two) times daily as needed (affected area).     VICTOZA 18 MG/3ML Sopn  Generic drug:  Liraglutide  INJECT 0.3ML (=1.'8MG'$ )      SUBCUTANEOUSLY DAILY        Allergies:  Allergies  Allergen Reactions  . Niacin Nausea Only    REACTION: upset stomach    Past Medical History  Diagnosis Date  . Other peripheral vascular disease(443.89)     bilateral lower extremity  . Hyperlipidemia   . Hypertension   . Transient global amnesia   . Contact dermatitis and other eczema due to plants (except food)   . Tobacco use disorder     dependent  . Atrial flutter ([place])     onset  2011. s/p EPS/RFA 12/2011  . Type II diabetes mellitus ([place])   . Chronic bronchitis   . GERD (gastroesophageal reflux disease)   . LV dysfunction     EF 45-50% 12/2011  . Adenomatous colon polyp   . COPD (chronic obstructive pulmonary disease) ([place])   . Arthritis     left hip replacement  . Cataract   . Tuberculosis     Past Surgical History  Procedure Laterality Date  . Partial hip arthroplasty Right 01/2000    "Right; replaced ball & stem"  . Partial hip arthroplasty Left   . Cystoscopy  11/2007    Dr Jeffie Pollock  . Cardiac electrophysiology mapping and ablation  03/2010  . Tee without cardioversion  12/28/2011    Procedure: TRANSESOPHAGEAL ECHOCARDIOGRAM (TEE);  Surgeon: Larey Dresser, MD;  Location: [facility];  Service: Cardiovascular;  Laterality: N/A;  . Colonoscopy with polypectomy  2006 & 2011    Dr [name]  . A flutter ablation N/A 12/28/2011    Procedure: ABLATION A FLUTTER;  Surgeon: Evans Lance, MD;  Location: [facility] CATH LAB;  Service: Cardiovascular;  Laterality: N/A;  . Colonoscopy    . Polypectomy    . Cardiac catheterization N/A 07/10/2015    Procedure: Left Heart Cath and Coronary Angiography;  Surgeon: Sherren Mocha, MD;  Location: [facility] CV LAB;  Service: Cardiovascular;  Laterality: N/A;    Family History  Problem Relation Age of Onset  . Stroke Mother 39    . Hypertension Mother   . Diabetes Mother   . Lung cancer Father     smoker  . Diabetes Paternal Grandmother   . Diabetes Brother     MI @ 77  . Diabetes Brother     Hepatitis C  . Throat cancer Paternal Uncle     1/2 uncle  . Heart attack Brother 40  . Colon cancer Neg Hx   . Esophageal cancer Neg Hx   . Rectal cancer Neg Hx   . Stomach cancer Neg Hx   . Crohn's disease Son     Social History:  reports that he has been smoking Cigarettes.  He has a 44 pack-year smoking history. He has never used smokeless tobacco. He reports that he drinks alcohol. He reports that he does not use illicit drugs.    Review of Systems       Lipids: He has persistently low HDL, LDL well controlled with, taking Crestor  Lab Results  Component Value  Date   CHOL 110 05/29/2015   HDL 38.20* 05/29/2015   LDLCALC 53 05/29/2015   TRIG 96.0 05/29/2015   CHOLHDL 3 05/29/2015              Has occasional  history of Numbness, tingling in the right first or second toes, no burning in feet, on B6 vitamins OTC Foot exam done in 10/15 Diabetic foot exam in 9/16 showed normal monofilament sensation in the toes and plantar surfaces, no skin lesions or ulcers on the feet and absent pedal pulses   Physical Examination:  BP 128/62 mmHg  Pulse 110  Temp(Src) 98.7 F (37.1 C) (Oral)  Ht 5' 10.5" (1.791 m)  Wt 270 lb (122.471 kg)  BMI 38.18 kg/m2  SpO2 92%     Trace ankle edema present    ASSESSMENT:  Diabetes type 2, uncontrolled    See history of present illness for detailed discussion of current diabetes management, blood sugar patterns and problems identified  Currently on maximum dose of Victoza and metformin along with basal insulin  His control is fair with A1c 7% He has difficulty losing weight Discussed need for regular exercise and he is wanting to start walking with a friend now  Fasting blood sugars tend to be  increased but more recently under 130 He has a few readings in the  afternoon but usually not after supper  PLAN:   No change in insulin More readings after supper Start walking as above Discussed glucose monitoring at various times Continue Victoza  Follow-up in 4 months     Patient Instructions  Check blood sugars on waking up 2-3  times a week  Also check blood sugars about 2 hours after a meal and do this after different meals by rotation  Recommended blood sugar levels on waking up is 90-130 and about 2 hours after meal is 130-160  Please bring your blood sugar monitor to each visit, thank you  Start walking daily         [facility] 01/24/2016, 11:24 AM   Note: This office note was prepared with Dragon voice recognition system technology. Any transcriptional errors that result from this process are unintentional.

## 2016-01-24 NOTE — Patient Instructions (Signed)
Check blood sugars on waking up 2-3  times a week  Also check blood sugars about 2 hours after a meal and do this after different meals by rotation  Recommended blood sugar levels on waking up is 90-130 and about 2 hours after meal is 130-160  Please bring your blood sugar monitor to each visit, thank you  Start walking daily

## 2016-01-24 NOTE — Telephone Encounter (Signed)
This encounter was created in error - please disregard.

## 2016-02-03 DIAGNOSIS — L84 Corns and callosities: Secondary | ICD-10-CM | POA: Diagnosis not present

## 2016-02-03 DIAGNOSIS — E1351 Other specified diabetes mellitus with diabetic peripheral angiopathy without gangrene: Secondary | ICD-10-CM | POA: Diagnosis not present

## 2016-02-03 DIAGNOSIS — I70293 Other atherosclerosis of native arteries of extremities, bilateral legs: Secondary | ICD-10-CM | POA: Diagnosis not present

## 2016-02-03 DIAGNOSIS — L602 Onychogryphosis: Secondary | ICD-10-CM | POA: Diagnosis not present

## 2016-02-20 ENCOUNTER — Other Ambulatory Visit: Payer: Self-pay | Admitting: Internal Medicine

## 2016-02-20 NOTE — Telephone Encounter (Signed)
Patient's a1c was checked by Endo---was abnormal---was seen at Endo for uncontrolled diabetes---are you ok with refilling this request?   Please advise, thanks

## 2016-02-25 ENCOUNTER — Ambulatory Visit (INDEPENDENT_AMBULATORY_CARE_PROVIDER_SITE_OTHER): Payer: Medicare Other | Admitting: Internal Medicine

## 2016-02-25 ENCOUNTER — Encounter (HOSPITAL_COMMUNITY): Payer: Self-pay | Admitting: Emergency Medicine

## 2016-02-25 ENCOUNTER — Inpatient Hospital Stay (HOSPITAL_COMMUNITY)
Admission: EM | Admit: 2016-02-25 | Discharge: 2016-02-29 | DRG: 291 | Disposition: A | Discharge status: Home / Self Care | Payer: Medicare Other | Attending: Internal Medicine | Admitting: Internal Medicine

## 2016-02-25 ENCOUNTER — Emergency Department (HOSPITAL_COMMUNITY): Payer: Medicare Other

## 2016-02-25 ENCOUNTER — Encounter: Payer: Self-pay | Admitting: Internal Medicine

## 2016-02-25 VITALS — BP 160/90 | HR 112 | Temp 97.5°F | Ht 70.5 in | Wt 268.0 lb

## 2016-02-25 DIAGNOSIS — Z833 Family history of diabetes mellitus: Secondary | ICD-10-CM

## 2016-02-25 DIAGNOSIS — I4891 Unspecified atrial fibrillation: Secondary | ICD-10-CM | POA: Diagnosis not present

## 2016-02-25 DIAGNOSIS — Z7901 Long term (current) use of anticoagulants: Secondary | ICD-10-CM | POA: Diagnosis not present

## 2016-02-25 DIAGNOSIS — I272 Other secondary pulmonary hypertension: Secondary | ICD-10-CM | POA: Diagnosis not present

## 2016-02-25 DIAGNOSIS — R600 Localized edema: Secondary | ICD-10-CM

## 2016-02-25 DIAGNOSIS — I2510 Atherosclerotic heart disease of native coronary artery without angina pectoris: Secondary | ICD-10-CM | POA: Diagnosis not present

## 2016-02-25 DIAGNOSIS — Z801 Family history of malignant neoplasm of trachea, bronchus and lung: Secondary | ICD-10-CM

## 2016-02-25 DIAGNOSIS — J9600 Acute respiratory failure, unspecified whether with hypoxia or hypercapnia: Secondary | ICD-10-CM | POA: Diagnosis present

## 2016-02-25 DIAGNOSIS — H269 Unspecified cataract: Secondary | ICD-10-CM | POA: Diagnosis present

## 2016-02-25 DIAGNOSIS — I771 Stricture of artery: Secondary | ICD-10-CM | POA: Diagnosis present

## 2016-02-25 DIAGNOSIS — L97819 Non-pressure chronic ulcer of other part of right lower leg with unspecified severity: Secondary | ICD-10-CM | POA: Diagnosis present

## 2016-02-25 DIAGNOSIS — I482 Chronic atrial fibrillation: Secondary | ICD-10-CM | POA: Diagnosis present

## 2016-02-25 DIAGNOSIS — Z823 Family history of stroke: Secondary | ICD-10-CM

## 2016-02-25 DIAGNOSIS — E785 Hyperlipidemia, unspecified: Secondary | ICD-10-CM | POA: Diagnosis present

## 2016-02-25 DIAGNOSIS — E1151 Type 2 diabetes mellitus with diabetic peripheral angiopathy without gangrene: Secondary | ICD-10-CM | POA: Diagnosis present

## 2016-02-25 DIAGNOSIS — I83028 Varicose veins of left lower extremity with ulcer other part of lower leg: Secondary | ICD-10-CM | POA: Diagnosis present

## 2016-02-25 DIAGNOSIS — L03119 Cellulitis of unspecified part of limb: Secondary | ICD-10-CM | POA: Diagnosis not present

## 2016-02-25 DIAGNOSIS — F17210 Nicotine dependence, cigarettes, uncomplicated: Secondary | ICD-10-CM | POA: Diagnosis present

## 2016-02-25 DIAGNOSIS — K219 Gastro-esophageal reflux disease without esophagitis: Secondary | ICD-10-CM | POA: Diagnosis present

## 2016-02-25 DIAGNOSIS — J441 Chronic obstructive pulmonary disease with (acute) exacerbation: Secondary | ICD-10-CM

## 2016-02-25 DIAGNOSIS — G4733 Obstructive sleep apnea (adult) (pediatric): Secondary | ICD-10-CM | POA: Diagnosis present

## 2016-02-25 DIAGNOSIS — J9601 Acute respiratory failure with hypoxia: Secondary | ICD-10-CM | POA: Diagnosis present

## 2016-02-25 DIAGNOSIS — N400 Enlarged prostate without lower urinary tract symptoms: Secondary | ICD-10-CM | POA: Diagnosis present

## 2016-02-25 DIAGNOSIS — I519 Heart disease, unspecified: Secondary | ICD-10-CM | POA: Diagnosis not present

## 2016-02-25 DIAGNOSIS — I83018 Varicose veins of right lower extremity with ulcer other part of lower leg: Secondary | ICD-10-CM | POA: Diagnosis present

## 2016-02-25 DIAGNOSIS — Z96643 Presence of artificial hip joint, bilateral: Secondary | ICD-10-CM | POA: Diagnosis present

## 2016-02-25 DIAGNOSIS — Z888 Allergy status to other drugs, medicaments and biological substances status: Secondary | ICD-10-CM

## 2016-02-25 DIAGNOSIS — I5033 Acute on chronic diastolic (congestive) heart failure: Secondary | ICD-10-CM | POA: Diagnosis present

## 2016-02-25 DIAGNOSIS — Z6837 Body mass index (BMI) 37.0-37.9, adult: Secondary | ICD-10-CM

## 2016-02-25 DIAGNOSIS — Z794 Long term (current) use of insulin: Secondary | ICD-10-CM

## 2016-02-25 DIAGNOSIS — I509 Heart failure, unspecified: Secondary | ICD-10-CM

## 2016-02-25 DIAGNOSIS — Z79899 Other long term (current) drug therapy: Secondary | ICD-10-CM | POA: Diagnosis not present

## 2016-02-25 DIAGNOSIS — R0602 Shortness of breath: Secondary | ICD-10-CM | POA: Diagnosis not present

## 2016-02-25 DIAGNOSIS — G4734 Idiopathic sleep related nonobstructive alveolar hypoventilation: Secondary | ICD-10-CM | POA: Diagnosis not present

## 2016-02-25 DIAGNOSIS — I872 Venous insufficiency (chronic) (peripheral): Secondary | ICD-10-CM | POA: Diagnosis present

## 2016-02-25 DIAGNOSIS — Z8249 Family history of ischemic heart disease and other diseases of the circulatory system: Secondary | ICD-10-CM | POA: Diagnosis not present

## 2016-02-25 DIAGNOSIS — L0390 Cellulitis, unspecified: Secondary | ICD-10-CM | POA: Insufficient documentation

## 2016-02-25 DIAGNOSIS — I4892 Unspecified atrial flutter: Secondary | ICD-10-CM | POA: Diagnosis present

## 2016-02-25 DIAGNOSIS — E669 Obesity, unspecified: Secondary | ICD-10-CM | POA: Diagnosis present

## 2016-02-25 DIAGNOSIS — E1159 Type 2 diabetes mellitus with other circulatory complications: Secondary | ICD-10-CM | POA: Diagnosis present

## 2016-02-25 DIAGNOSIS — Z808 Family history of malignant neoplasm of other organs or systems: Secondary | ICD-10-CM | POA: Diagnosis not present

## 2016-02-25 DIAGNOSIS — I4821 Permanent atrial fibrillation: Secondary | ICD-10-CM | POA: Diagnosis present

## 2016-02-25 DIAGNOSIS — Z7984 Long term (current) use of oral hypoglycemic drugs: Secondary | ICD-10-CM

## 2016-02-25 DIAGNOSIS — I4819 Other persistent atrial fibrillation: Secondary | ICD-10-CM | POA: Diagnosis present

## 2016-02-25 DIAGNOSIS — L97829 Non-pressure chronic ulcer of other part of left lower leg with unspecified severity: Secondary | ICD-10-CM | POA: Diagnosis present

## 2016-02-25 DIAGNOSIS — I110 Hypertensive heart disease with heart failure: Secondary | ICD-10-CM | POA: Diagnosis not present

## 2016-02-25 DIAGNOSIS — I25709 Atherosclerosis of coronary artery bypass graft(s), unspecified, with unspecified angina pectoris: Secondary | ICD-10-CM | POA: Diagnosis present

## 2016-02-25 DIAGNOSIS — I739 Peripheral vascular disease, unspecified: Secondary | ICD-10-CM | POA: Diagnosis present

## 2016-02-25 LAB — CBC
HCT: 45 % (ref 39.0–52.0)
HEMOGLOBIN: 14.8 g/dL (ref 13.0–17.0)
MCH: 30.1 pg (ref 26.0–34.0)
MCHC: 32.9 g/dL (ref 30.0–36.0)
MCV: 91.5 fL (ref 78.0–100.0)
PLATELETS: 157 10*3/uL (ref 150–400)
RBC: 4.92 MIL/uL (ref 4.22–5.81)
RDW: 14 % (ref 11.5–15.5)
WBC: 7.6 10*3/uL (ref 4.0–10.5)

## 2016-02-25 LAB — BASIC METABOLIC PANEL
ANION GAP: 7 (ref 5–15)
BUN: 12 mg/dL (ref 6–20)
CALCIUM: 8.8 mg/dL — AB (ref 8.9–10.3)
CO2: 22 mmol/L (ref 22–32)
Chloride: 112 mmol/L — ABNORMAL HIGH (ref 101–111)
Creatinine, Ser: 1.18 mg/dL (ref 0.61–1.24)
Glucose, Bld: 110 mg/dL — ABNORMAL HIGH (ref 65–99)
Potassium: 3.8 mmol/L (ref 3.5–5.1)
SODIUM: 141 mmol/L (ref 135–145)

## 2016-02-25 LAB — I-STAT TROPONIN, ED: TROPONIN I, POC: 0.01 ng/mL (ref 0.00–0.08)

## 2016-02-25 LAB — BRAIN NATRIURETIC PEPTIDE: B NATRIURETIC PEPTIDE 5: 226.9 pg/mL — AB (ref 0.0–100.0)

## 2016-02-25 LAB — GLUCOSE, CAPILLARY
Glucose-Capillary: 107 mg/dL — ABNORMAL HIGH (ref 65–99)
Glucose-Capillary: 95 mg/dL (ref 65–99)

## 2016-02-25 MED ORDER — ADULT MULTIVITAMIN W/MINERALS CH
1.0000 | ORAL_TABLET | Freq: Every day | ORAL | Status: DC
  Administered 2016-02-26 – 2016-02-29 (×4): 1 via ORAL
  Filled 2016-02-25 (×8): qty 1

## 2016-02-25 MED ORDER — FUROSEMIDE 10 MG/ML IJ SOLN
60.0000 mg | Freq: Two times a day (BID) | INTRAMUSCULAR | Status: DC
  Administered 2016-02-25 – 2016-02-26 (×2): 60 mg via INTRAVENOUS
  Filled 2016-02-25 (×2): qty 6

## 2016-02-25 MED ORDER — RIVAROXABAN 20 MG PO TABS
20.0000 mg | ORAL_TABLET | Freq: Every day | ORAL | Status: DC
  Administered 2016-02-26 – 2016-02-28 (×3): 20 mg via ORAL
  Filled 2016-02-25 (×3): qty 1

## 2016-02-25 MED ORDER — ALBUTEROL SULFATE (2.5 MG/3ML) 0.083% IN NEBU
5.0000 mg | INHALATION_SOLUTION | Freq: Once | RESPIRATORY_TRACT | Status: AC
  Administered 2016-02-25: 5 mg via RESPIRATORY_TRACT
  Filled 2016-02-25: qty 6

## 2016-02-25 MED ORDER — MUPIROCIN 2 % EX OINT: 1.0000 "application " | TOPICAL_OINTMENT | Freq: Two times a day (BID) | CUTANEOUS | Status: DC | PRN

## 2016-02-25 MED ORDER — CILOSTAZOL 100 MG PO TABS
100.0000 mg | ORAL_TABLET | Freq: Two times a day (BID) | ORAL | Status: DC
  Administered 2016-02-25 – 2016-02-29 (×8): 100 mg via ORAL
  Filled 2016-02-25 (×10): qty 1

## 2016-02-25 MED ORDER — LOSARTAN POTASSIUM 50 MG PO TABS
100.0000 mg | ORAL_TABLET | Freq: Every day | ORAL | Status: DC
  Administered 2016-02-26 – 2016-02-28 (×3): 100 mg via ORAL
  Filled 2016-02-25 (×3): qty 2

## 2016-02-25 MED ORDER — SODIUM CHLORIDE 0.9% FLUSH
3.0000 mL | INTRAVENOUS | Status: DC | PRN
Start: 2016-02-25 — End: 2016-02-29

## 2016-02-25 MED ORDER — ONDANSETRON HCL 4 MG/2ML IJ SOLN
4.0000 mg | Freq: Four times a day (QID) | INTRAMUSCULAR | Status: DC | PRN
Start: 2016-02-25 — End: 2016-02-29

## 2016-02-25 MED ORDER — MOMETASONE FURO-FORMOTEROL FUM 100-5 MCG/ACT IN AERO
2.0000 | INHALATION_SPRAY | Freq: Two times a day (BID) | RESPIRATORY_TRACT | Status: DC
  Administered 2016-02-25 – 2016-02-29 (×8): 2 via RESPIRATORY_TRACT
  Filled 2016-02-25: qty 8.8

## 2016-02-25 MED ORDER — METOPROLOL TARTRATE 50 MG PO TABS
100.0000 mg | ORAL_TABLET | Freq: Two times a day (BID) | ORAL | Status: DC
  Administered 2016-02-25 – 2016-02-29 (×8): 100 mg via ORAL
  Filled 2016-02-25 (×8): qty 2

## 2016-02-25 MED ORDER — TAMSULOSIN HCL 0.4 MG PO CAPS
0.4000 mg | ORAL_CAPSULE | Freq: Every day | ORAL | Status: DC
  Administered 2016-02-26 – 2016-02-29 (×4): 0.4 mg via ORAL
  Filled 2016-02-25 (×4): qty 1

## 2016-02-25 MED ORDER — ROSUVASTATIN CALCIUM 10 MG PO TABS
10.0000 mg | ORAL_TABLET | Freq: Every day | ORAL | Status: DC
  Administered 2016-02-26 – 2016-02-29 (×4): 10 mg via ORAL
  Filled 2016-02-25 (×4): qty 1

## 2016-02-25 MED ORDER — FUROSEMIDE 10 MG/ML IJ SOLN
60.0000 mg | Freq: Once | INTRAMUSCULAR | Status: AC
  Administered 2016-02-25: 60 mg via INTRAVENOUS
  Filled 2016-02-25: qty 8

## 2016-02-25 MED ORDER — SODIUM CHLORIDE 0.9 % IV SOLN: 250.0000 mL | INTRAVENOUS | Status: DC | PRN

## 2016-02-25 MED ORDER — ACETAMINOPHEN 325 MG PO TABS: 650.0000 mg | ORAL_TABLET | Freq: Three times a day (TID) | ORAL | Status: DC | PRN

## 2016-02-25 MED ORDER — CLOBETASOL PROPIONATE 0.05 % EX FOAM: Freq: Two times a day (BID) | CUTANEOUS | Status: DC

## 2016-02-25 MED ORDER — SODIUM CHLORIDE 0.9% FLUSH
3.0000 mL | Freq: Two times a day (BID) | INTRAVENOUS | Status: DC
Start: 2016-02-25 — End: 2016-02-29
  Administered 2016-02-26 – 2016-02-29 (×8): 3 mL via INTRAVENOUS

## 2016-02-25 MED ORDER — FLAX SEED OIL 1000 MG PO CAPS: 1.0000 | ORAL_CAPSULE | Freq: Two times a day (BID) | ORAL | Status: DC

## 2016-02-25 MED ORDER — INSULIN DETEMIR 100 UNIT/ML ~~LOC~~ SOLN
24.0000 [IU] | Freq: Every day | SUBCUTANEOUS | Status: DC
  Administered 2016-02-25 – 2016-02-28 (×4): 24 [IU] via SUBCUTANEOUS
  Filled 2016-02-25 (×5): qty 0.24

## 2016-02-25 NOTE — ED Provider Notes (Signed)
CSN: [id number]     Arrival date & time 02/25/16  1155 History   First MD Initiated Contact with Patient 02/25/16 1231     Chief Complaint  Patient presents with  . weeping legs    . Shortness of Breath     (Consider location/radiation/quality/duration/timing/severity/associated sxs/prior Treatment) HPI Comments: 65 y.o. Male with history of hyperlipidemia, HTN, atrial flutter on Xarelto, COPD, CHF presents for shortness of breath and leg swelling.  Patient reports that over the last 3 weeks he has had increasing shortness of breath is especially with exertion. He also reports that he has been uncomfortable at night when he tries to lay down to sleep. He has had to be up on multiple pillows to feel comfortable. He is also noted over the same period of time that his legs have been increasingly more swollen. He denies fevers or chills. He reports coughing up clearish whitish sputum. He saw his primary care physician this morning and was noted to be hypoxic at 83% on room air was placed on supplemental O2 oxygen. He does not usually use oxygen at home.   Past Medical History  Diagnosis Date  . Other peripheral vascular disease(443.89)     bilateral lower extremity  . Hyperlipidemia   . Hypertension   . Transient global amnesia   . Contact dermatitis and other eczema due to plants (except food)   . Tobacco use disorder     dependent  . Atrial flutter ([place])     onset  2011. s/p EPS/RFA 12/2011  . Type II diabetes mellitus ([place])   . Chronic bronchitis   . GERD (gastroesophageal reflux disease)   . LV dysfunction     EF 45-50% 12/2011  . Adenomatous colon polyp   . COPD (chronic obstructive pulmonary disease) ([place])   . Arthritis     left hip replacement  . Cataract   . Tuberculosis    Past Surgical History  Procedure Laterality Date  . Partial hip arthroplasty Right 01/2000    "Right; replaced ball & stem"  . Partial hip arthroplasty Left   . Cystoscopy  11/2007    Dr Jeffie Pollock  .  Cardiac electrophysiology mapping and ablation  03/2010  . Tee without cardioversion  12/28/2011    Procedure: TRANSESOPHAGEAL ECHOCARDIOGRAM (TEE);  Surgeon: Larey Dresser, MD;  Location: [facility];  Service: Cardiovascular;  Laterality: N/A;  . Colonoscopy with polypectomy  2006 & 2011    Dr [name]  . A flutter ablation N/A 12/28/2011    Procedure: ABLATION A FLUTTER;  Surgeon: Evans Lance, MD;  Location: [facility] CATH LAB;  Service: Cardiovascular;  Laterality: N/A;  . Colonoscopy    . Polypectomy    . Cardiac catheterization N/A 07/10/2015    Procedure: Left Heart Cath and Coronary Angiography;  Surgeon: Sherren Mocha, MD;  Location: [facility] CV LAB;  Service: Cardiovascular;  Laterality: N/A;   Family History  Problem Relation Age of Onset  . Stroke Mother 69  . Hypertension Mother   . Diabetes Mother   . Lung cancer Father     smoker  . Diabetes Paternal Grandmother   . Diabetes Brother     MI @ 67  . Diabetes Brother     Hepatitis C  . Throat cancer Paternal Uncle     1/2 uncle  . Heart attack Brother 65  . Colon cancer Neg Hx   . Esophageal cancer Neg Hx   . Rectal cancer Neg Hx   . Stomach  cancer Neg Hx   . Crohn's disease Son    Social History  Substance Use Topics  . Smoking status: Current Every Day Smoker -- 1.00 packs/day for 44 years    Types: Cigarettes  . Smokeless tobacco: Never Used     Comment: started @ age 21, up to 2 ppd;1.25-1.5 ppd as of 11/23/13  . Alcohol Use: 0.0 oz/week    0 Standard drinks or equivalent per week     Comment:  11/23/13 "2-3 drinks per year"    Review of Systems  Constitutional: Positive for fatigue. Negative for fever and chills.  HENT: Negative for congestion, postnasal drip, rhinorrhea and sinus pressure.   Eyes: Negative for visual disturbance.  Respiratory: Positive for cough and shortness of breath. Negative for chest tightness and wheezing.   Cardiovascular: Positive for leg swelling. Negative for chest pain and  palpitations.  Gastrointestinal: Negative for nausea, vomiting, abdominal pain and diarrhea.  Genitourinary: Negative for dysuria, urgency and frequency.  Musculoskeletal: Negative for myalgias and back pain.  Skin: Positive for wound. Negative for rash.  Neurological: Negative for dizziness, weakness, light-headedness and headaches.  Hematological: Bruises/bleeds easily.      Allergies  Niacin  Home Medications   Prior to Admission medications   Medication Sig Start Date End Date Taking? Authorizing Provider  acetaminophen (TYLENOL) 650 MG CR tablet Take 650 mg by mouth every 8 (eight) hours as needed for pain.    Historical Provider, MD  cilostazol (PLETAL) 100 MG tablet Take 1 tablet (100 mg total) by mouth 2 (two) times daily. 10/25/15   Hoyt Koch, MD  clobetasol (OLUX) 0.05 % topical foam Apply topically 2 (two) times daily.  12/27/14   Historical Provider, MD  Flaxseed, Linseed, (FLAX SEED OIL) 1000 MG CAPS Take 1 capsule by mouth 2 (two) times daily.     Historical Provider, MD  Fluticasone-Salmeterol (ADVAIR) 100-50 MCG/DOSE AEPB Inhale 1 puff into the lungs 2 (two) times daily.    Historical Provider, MD  furosemide (LASIX) 40 MG tablet Take 1 tablet (40 mg total) by mouth daily. 09/30/15   Hoyt Koch, MD  HYDROcodone-homatropine [facility]) 5-1.5 MG/5ML syrup Take 5 mLs by mouth every 8 (eight) hours as needed for cough. 12/30/15   Hoyt Koch, MD  Insulin Detemir (LEVEMIR FLEXTOUCH) 100 UNIT/ML Pen INJECT 24 UNITS INTO THE   SKIN DAILY 07/24/15   Elayne Snare, MD  Insulin Pen Needle (NOVOFINE) 32G X 6 MM MISC Use 2 needles daily with Levimir pen and Victoza pen  DX: E11.69 05/17/15   Elayne Snare, MD  losartan (COZAAR) 100 MG tablet take 1 tablet by mouth once daily 07/24/15   Sherren Mocha, MD  metformin (FORTAMET) 1000 MG (OSM) 24 hr tablet TAKE 1 TABLET TWICE A DAY 09/24/15   Elayne Snare, MD  metoprolol (LOPRESSOR) 100 MG tablet take 1 tablet by mouth twice a  day 08/21/15   Sherren Mocha, MD  Multiple Vitamins-Minerals (CENTRUM ADULTS) TABS Take 1 tablet by mouth daily.    Historical Provider, MD  mupirocin ointment (BACTROBAN) 2 % Apply 1 application topically 2 (two) times daily as needed (affected areas).     Historical Provider, MD  Omega-3 Fatty Acids (FISH OIL) 1000 MG CAPS Take 2 capsules by mouth 2 (two) times daily.     Historical Provider, MD  ONE TOUCH ULTRA TEST test strip TEST once daily 02/20/16   Hoyt Koch, MD  rivaroxaban (XARELTO) 20 MG TABS tablet Take 1 tablet (20  mg total) by mouth daily with supper. 01/03/16   Will Meredith Leeds, MD  rosuvastatin (CRESTOR) 10 MG tablet Take 1 tablet (10 mg total) by mouth daily. 10/28/15   Hoyt Koch, MD  tamsulosin (FLOMAX) 0.4 MG CAPS capsule Take 0.4 mg by mouth daily.     Historical Provider, MD  triamcinolone cream (KENALOG) 0.1 % Apply 1 application topically 2 (two) times daily as needed (affected area).     Historical Provider, MD  VICTOZA 18 MG/3ML SOPN INJECT 0.3ML (=1.'8MG'$ )      SUBCUTANEOUSLY DAILY 10/23/15   Elayne Snare, MD   BP 124/96 mmHg  Pulse 91  Temp(Src) 98.1 F (36.7 C) (Oral)  Resp 21  SpO2 94% Physical Exam  Constitutional: He is oriented to person, place, and time. He appears well-developed and well-nourished. No distress.  HENT:  Head: Normocephalic and atraumatic.  Right Ear: External ear normal.  Left Ear: External ear normal.  Mouth/Throat: Oropharynx is clear and moist. No oropharyngeal exudate.  Eyes: EOM are normal. Pupils are equal, round, and reactive to light.  Neck: Normal range of motion. Neck supple.  Cardiovascular: Normal rate, regular rhythm and intact distal pulses.   Pulmonary/Chest: Effort normal. No respiratory distress. He has no wheezes. He has rales (bilateral, diffuse, symmetric).  Abdominal: Soft. He exhibits no distension. There is no tenderness.  Musculoskeletal: He exhibits edema (3+ pitting edema, symmetric of the lower  extremities with wheeping and areas of open skin).  Neurological: He is alert and oriented to person, place, and time.  Skin: Skin is warm and dry. No rash noted. He is not diaphoretic. No erythema.  Vitals reviewed.   ED Course  Procedures (including critical care time) Labs Review Labs Reviewed  BASIC METABOLIC PANEL - Abnormal; Notable for the following:    Chloride 112 (*)    Glucose, Bld 110 (*)    Calcium 8.8 (*)    All other components within normal limits  BRAIN NATRIURETIC PEPTIDE - Abnormal; Notable for the following:    B Natriuretic Peptide 226.9 (*)    All other components within normal limits  CBC  I-STAT TROPOININ, ED    Imaging Review Dg Chest 2 View  02/25/2016  CLINICAL DATA:  Shortness of breath and lower extremity swelling EXAM: CHEST  2 VIEW COMPARISON:  November 14, 2015 FINDINGS: The heart size and mediastinal contours are stable. There is pulmonary edema. There is no focal pneumonia pneumonia or pleural effusion. There is scarring of the lateral right upper lobe unchanged. The visualized skeletal structures are stable. IMPRESSION: Pulmonary edema. Electronically Signed   By: Abelardo Diesel M.D.   On: 02/25/2016 14:01   I have personally reviewed and evaluated these images and lab results as part of my medical decision-making.   EKG Interpretation   Date/Time:  Tuesday February 25 2016 12:17:10 EDT Ventricular Rate:  103 PR Interval:    QRS Duration: 93 QT Interval:  360 QTC Calculation: 471 R Axis:   71 Text Interpretation:  Atrial fibrillation Ventricular premature complex No  significant change since last tracing Confirmed by NGUYEN, EMILY ([id number])  on 02/25/2016 12:21:44 PM      MDM  Patient was seen and evaluated in stable condition on supplemental oxygen. Physical examination not consistent with cellulitis in my opinion. History and physical most concerning for CHF at this time. Chest x-ray with pulmonary edema. BNP elevated. Patient was given 60 mg of  IV Lasix. He continued to require supplemental nasal cannula. Tried hospitalists were  consulted who agreed with admission. Patient admitted to telemetry for further diuresis. Final diagnoses:  None    1. Acute CHF    Harvel Quale, MD 02/25/16 1526

## 2016-02-25 NOTE — Assessment & Plan Note (Signed)
Oxygen level 78% sat in the clinic which improved to 91% on 2-3 L oxygen. He was advised to go straight to the ER which he agrees to do. Suspect COPD exacerbation given changes to lung exam noted.

## 2016-02-25 NOTE — Progress Notes (Signed)
Pre visit review using our clinic review tool, if applicable. No additional management support is needed unless otherwise documented below in the visit note. 

## 2016-02-25 NOTE — ED Notes (Signed)
Pt can go to floor at 15:45.

## 2016-02-25 NOTE — Assessment & Plan Note (Signed)
Hard to tell if he is in flare but I tend to think that this is exacerbation of his lung disease and cellulitis with swelling over heart failure exacerbation. Weight is stable but he is having more problems with breathing at night time. He will go to ER with his hypoxia.

## 2016-02-25 NOTE — Progress Notes (Signed)
Notified admitting MD of patient arrival to the unit, waiting on orders.

## 2016-02-25 NOTE — ED Notes (Signed)
Patient sent from PCP for swelling in legs with weeping fluid that has gotten worse over past several weeks.  Patient was 83% on room air and put on O2 and sent over for breathing treatments.

## 2016-02-25 NOTE — Progress Notes (Signed)
PHARMACIST - PHYSICIAN ORDER COMMUNICATION  CONCERNING: P&T Medication Policy on Herbal Medications  DESCRIPTION:  This patient's order for:  Flax seed  has been noted.  This product(s) is classified as an "herbal" or natural product. Due to a lack of definitive safety studies or FDA approval, nonstandard manufacturing practices, plus the potential risk of unknown drug-drug interactions while on inpatient medications, the Pharmacy and Therapeutics Committee does not permit the use of "herbal" or natural products of this type within [facility].   ACTION TAKEN: The pharmacy department is unable to verify this order at this time and your patient has been informed of this safety policy. Please reevaluate patient's clinical condition at discharge and address if the herbal or natural product(s) should be resumed at that time.  Doreene Eland, PharmD, BCPS.   Pager: [phone] 02/25/2016 7:22 PM

## 2016-02-25 NOTE — Progress Notes (Signed)
   Subjective:    Patient ID: Buck Mam., male    DOB: 07-16-1951, 65 y.o.   MRN: [id number]  HPI The patient is a 65 YO man coming in for several concerns. He is having more problems breathing at night time and is sleeping sitting up for some time. This is causing him to be smoking more due to having more time. He is having a lot more SOB with exertion as well during the day. Using his inhaler more. He has eaten some more salt lately but has overall been watching lately. Legs more swollen. His oxygen levels were severly low on clinic visit and put on oxygen. Does not typically use oxygen at home.  Other concern is leg swelling with blisters and redness. He is a diabetic and does take insulin daily. Sugars have been about the same. Legs are sore and hurt much of the time. He does have toenail fungus on his toes. He is having chills but no fevers.   Review of Systems  Constitutional: Positive for chills and activity change. Negative for fever, appetite change, fatigue and unexpected weight change.  HENT: Positive for congestion. Negative for ear discharge, ear pain, rhinorrhea, sinus pressure, sore throat and trouble swallowing.   Eyes: Negative.   Respiratory: Positive for cough, shortness of breath and wheezing. Negative for chest tightness.   Cardiovascular: Positive for leg swelling. Negative for chest pain and palpitations.  Gastrointestinal: Negative for nausea, abdominal pain, diarrhea, constipation and abdominal distention.  Musculoskeletal: Negative for myalgias, back pain and arthralgias.  Skin: Positive for color change, rash and wound.  Neurological: Negative.   Psychiatric/Behavioral: Negative.        Objective:   Physical Exam  Constitutional: He is oriented to person, place, and time. He appears well-developed and well-nourished.  Overweight  HENT:  Head: Normocephalic and atraumatic.  Eyes: EOM are normal.  Neck: Normal range of motion.  Cardiovascular:  Regular rhythm.   Tachycardic  Pulmonary/Chest: Effort normal. No respiratory distress. He has wheezes. He has no rales.  Severe wheezing bilaterally with coarse rhonchi  Abdominal: Soft. Bowel sounds are normal. He exhibits no distension. There is no tenderness. There is no rebound.  Musculoskeletal: He exhibits edema.  2-3+ edema in the legs, with color change different from his typical venous stasis including redness in his feet and pinkness around his swelling. Several ulcers which are open and weeping fluid. Also with several fluid filled blisters.   Neurological: He is alert and oriented to person, place, and time. Coordination normal.  Skin: Skin is warm and dry.   Filed Vitals:   02/25/16 1028  BP: 160/90  Pulse: 112  Temp: 97.5 F (36.4 C)  TempSrc: Oral  Height: 5' 10.5" (1.791 m)  Weight: 268 lb (121.564 kg)  SpO2: 78%      Assessment & Plan:

## 2016-02-25 NOTE — Assessment & Plan Note (Signed)
Pulse ox 78% with minimal exertion in the office which normalized on 2-3 L oxygen. Recommended evaluation immediately in the ER with treatment. He agrees to go and refuses ambulance ride.

## 2016-02-25 NOTE — Assessment & Plan Note (Signed)
Strong suspicion with color change of his edema, as well as redness of his feet. Mycotic toenails and diabetic. He is tachycardic even at rest during the visit (monitored on pulse ox).

## 2016-02-25 NOTE — Patient Instructions (Signed)
WE will have you go to the ER at [facility] long to be seen and given some breathing treatments and attention to your legs.

## 2016-02-25 NOTE — H&P (Signed)
History and Physical  Neil Carroll CZY:[id number] DOB: Feb 04, 1951 DOA: 02/25/2016  Referring physician: ER Physician PCP: Hoyt Koch, MD  Outpatient Specialists: Cardiology  Patient coming from: Home  Chief Complaint: SOB and DOE  HPI: 65 year old male with history of chronic atrial fibrillation, COPD, obesity, PVD, HL, pulmonary hypertension and HTN. Patient may also have likely undiagnosed diastolic dysfunction. ECHO done on 01/2016 revealed normal EF, but diastolic function could not be assessed due to atrial fibrillation. Patient is a poor historian. Patient was seen alongside significant other. Patient presents with 2-4 weeks of progressive shortness of breath, dyspnea on exertion, orthopnea and bilateral leg edema. CXR done on presentation revealed pulmonary edema. BNP is elevated. No chest pain, no headache, no neck pain, no fever or chills, no GI symptoms and no urinary symptoms. On presentation, patient was in atrial fibrillation with rapid ventricular response. Patient will be admitted for further assessment and management.  ED Course: Patient was given IV Lasix '60mg'$  X 1. Pertinent labs: Elevated BNP. EKG: Independently reviewed.  Imaging: independently reviewed.   Review of Systems: As in HPI. 12 systems reviewed. Negative for fever, visual changes, sore throat, rash, new muscle aches, chest pain, dysuria, bleeding, n/v/abdominal pain.  Past Medical History  Diagnosis Date  . Other peripheral vascular disease(443.89)     bilateral lower extremity  . Hyperlipidemia   . Hypertension   . Transient global amnesia   . Contact dermatitis and other eczema due to plants (except food)   . Tobacco use disorder     dependent  . Atrial flutter ([place])     onset  2011. s/p EPS/RFA 12/2011  . Type II diabetes mellitus ([place])   . Chronic bronchitis   . GERD (gastroesophageal reflux disease)   . LV dysfunction     EF 45-50% 12/2011  . Adenomatous colon polyp   . COPD  (chronic obstructive pulmonary disease) ([place])   . Arthritis     left hip replacement  . Cataract   . Tuberculosis     Past Surgical History  Procedure Laterality Date  . Partial hip arthroplasty Right 01/2000    "Right; replaced ball & stem"  . Partial hip arthroplasty Left   . Cystoscopy  11/2007    Dr Jeffie Pollock  . Cardiac electrophysiology mapping and ablation  03/2010  . Tee without cardioversion  12/28/2011    Procedure: TRANSESOPHAGEAL ECHOCARDIOGRAM (TEE);  Surgeon: Larey Dresser, MD;  Location: [facility];  Service: Cardiovascular;  Laterality: N/A;  . Colonoscopy with polypectomy  2006 & 2011    Dr [name]  . A flutter ablation N/A 12/28/2011    Procedure: ABLATION A FLUTTER;  Surgeon: Evans Lance, MD;  Location: [facility] CATH LAB;  Service: Cardiovascular;  Laterality: N/A;  . Colonoscopy    . Polypectomy    . Cardiac catheterization N/A 07/10/2015    Procedure: Left Heart Cath and Coronary Angiography;  Surgeon: Sherren Mocha, MD;  Location: [place] CV LAB;  Service: Cardiovascular;  Laterality: N/A;     reports that he has been smoking Cigarettes.  He has a 44 pack-year smoking history. He has never used smokeless tobacco. He reports that he drinks alcohol. He reports that he does not use illicit drugs.  Allergies  Allergen Reactions  . Niacin Nausea Only    REACTION: upset stomach    Family History  Problem Relation Age of Onset  . Stroke Mother 22  . Hypertension Mother   . Diabetes Mother   .  Lung cancer Father     smoker  . Diabetes Paternal Grandmother   . Diabetes Brother     MI @ 36  . Diabetes Brother     Hepatitis C  . Throat cancer Paternal Uncle     1/2 uncle  . Heart attack Brother 57  . Colon cancer Neg Hx   . Esophageal cancer Neg Hx   . Rectal cancer Neg Hx   . Stomach cancer Neg Hx   . Crohn's disease Son      Prior to Admission medications   Medication Sig Start Date End Date Taking? Authorizing Provider  acetaminophen (TYLENOL) 650  MG CR tablet Take 650 mg by mouth every 8 (eight) hours as needed for pain.   Yes Historical Provider, MD  cilostazol (PLETAL) 100 MG tablet Take 1 tablet (100 mg total) by mouth 2 (two) times daily. 10/25/15  Yes Hoyt Koch, MD  clobetasol (OLUX) 0.05 % topical foam Apply topically 2 (two) times daily.  12/27/14  Yes Historical Provider, MD  Flaxseed, Linseed, (FLAX SEED OIL) 1000 MG CAPS Take 1 capsule by mouth 2 (two) times daily.    Yes Historical Provider, MD  Fluticasone-Salmeterol (ADVAIR) 100-50 MCG/DOSE AEPB Inhale 1 puff into the lungs 2 (two) times daily.   Yes Historical Provider, MD  furosemide (LASIX) 40 MG tablet Take 1 tablet (40 mg total) by mouth daily. 09/30/15  Yes Hoyt Koch, MD  Insulin Detemir (LEVEMIR FLEXTOUCH) 100 UNIT/ML Pen INJECT 24 UNITS INTO THE   SKIN DAILY 07/24/15  Yes Elayne Snare, MD  losartan (COZAAR) 100 MG tablet take 1 tablet by mouth once daily 07/24/15  Yes Sherren Mocha, MD  metformin (FORTAMET) 1000 MG (OSM) 24 hr tablet TAKE 1 TABLET TWICE A DAY 09/24/15  Yes Elayne Snare, MD  metoprolol (LOPRESSOR) 100 MG tablet take 1 tablet by mouth twice a day 08/21/15  Yes Sherren Mocha, MD  Multiple Vitamins-Minerals (CENTRUM ADULTS) TABS Take 1 tablet by mouth daily.   Yes Historical Provider, MD  Omega-3 Fatty Acids (FISH OIL) 1000 MG CAPS Take 2 capsules by mouth 2 (two) times daily.    Yes Historical Provider, MD  rivaroxaban (XARELTO) 20 MG TABS tablet Take 1 tablet (20 mg total) by mouth daily with supper. Patient taking differently: Take 20 mg by mouth daily.  01/03/16  Yes Will Meredith Leeds, MD  rosuvastatin (CRESTOR) 10 MG tablet Take 1 tablet (10 mg total) by mouth daily. 10/28/15  Yes Hoyt Koch, MD  tamsulosin (FLOMAX) 0.4 MG CAPS capsule Take 0.4 mg by mouth daily.    Yes Historical Provider, MD  VICTOZA 18 MG/3ML SOPN INJECT 0.3ML (=1.'8MG'$ )      SUBCUTANEOUSLY DAILY 10/23/15  Yes Elayne Snare, MD  Insulin Pen Needle (NOVOFINE) 32G X 6 MM  MISC Use 2 needles daily with Levimir pen and Victoza pen  DX: E11.69 05/17/15   Elayne Snare, MD  mupirocin ointment (BACTROBAN) 2 % Apply 1 application topically 2 (two) times daily as needed (affected areas).     Historical Provider, MD  ONE TOUCH ULTRA TEST test strip TEST once daily 02/20/16   Hoyt Koch, MD  triamcinolone cream (KENALOG) 0.1 % Apply 1 application topically 2 (two) times daily as needed (affected area).     Historical Provider, MD    Physical Exam: Filed Vitals:   02/25/16 1207 02/25/16 1209 02/25/16 1400 02/25/16 1603  BP: 132/97  124/96 118/78  Pulse: 98  131 110  Temp: 98.1  F (36.7 C)   98 F (36.7 C)  TempSrc: Oral   Oral  Resp: '20  16 18  '$ Height:    '5\' 11"'$  (1.803 m)  Weight:    121.065 kg (266 lb 14.4 oz)  SpO2: 91% 91% 93% 96%   Constitutional:  . Obese. Not in distress. Eyes:  . PERRL and irises appear normal . Normal conjunctivae and lids ENMT:  . external ears, nose appear normal  Neck:  Elevated JVD. Supple.   Respiratory:  Mild wheeze bilaterally  Cardiovascular:  . S1S2, irregular.  Abdomen:  . Obese, non tender. Organs are difficult to assess.  Musculoskeletal:  . Leg edema, with superficial ulcerations (likely from significant edema of the lower extremities). Skin:  . No rashes. Superficial ulcers .   Neurologic:  . CN 2-12 intact . Sensation all 4 extremities intact  Wt Readings from Last 3 Encounters:  02/25/16 121.065 kg (266 lb 14.4 oz)  02/25/16 121.564 kg (268 lb)  01/24/16 122.471 kg (270 lb)    I have personally reviewed following labs and imaging studies  Labs on Admission:  CBC:  Recent Labs Lab 02/25/16 1227  WBC 7.6  HGB 14.8  HCT 45.0  MCV 91.5  PLT 300   Basic Metabolic Panel:  Recent Labs Lab 02/25/16 1227  NA 141  K 3.8  CL 112*  CO2 22  GLUCOSE 110*  BUN 12  CREATININE 1.18  CALCIUM 8.8*   Liver Function Tests: No results for input(s): AST, ALT, ALKPHOS, BILITOT, PROT, ALBUMIN  in the last 168 hours. No results for input(s): LIPASE, AMYLASE in the last 168 hours. No results for input(s): AMMONIA in the last 168 hours. Coagulation Profile: No results for input(s): INR, PROTIME in the last 168 hours. Cardiac Enzymes: No results for input(s): CKTOTAL, CKMB, CKMBINDEX, TROPONINI in the last 168 hours. BNP (last 3 results) No results for input(s): PROBNP in the last 8760 hours. HbA1C: No results for input(s): HGBA1C in the last 72 hours. CBG:  Recent Labs Lab 02/25/16 1659  GLUCAP 107*   Lipid Profile: No results for input(s): CHOL, HDL, LDLCALC, TRIG, CHOLHDL, LDLDIRECT in the last 72 hours. Thyroid Function Tests: No results for input(s): TSH, T4TOTAL, FREET4, T3FREE, THYROIDAB in the last 72 hours. Anemia Panel: No results for input(s): VITAMINB12, FOLATE, FERRITIN, TIBC, IRON, RETICCTPCT in the last 72 hours. Urine analysis:    Component Value Date/Time   COLORURINE YELLOW 01/31/2015 [place] 01/31/2015 1025   LABSPEC 1.010 01/31/2015 1025   PHURINE 6.0 01/31/2015 1025   GLUCOSEU NEGATIVE 01/31/2015 1025   HGBUR NEGATIVE 01/31/2015 1025   [place] 01/31/2015 1025   KETONESUR NEGATIVE 01/31/2015 1025   UROBILINOGEN 0.2 01/31/2015 1025   NITRITE NEGATIVE 01/31/2015 1025   LEUKOCYTESUR NEGATIVE 01/31/2015 1025   Sepsis Labs: '@LABRCNTIP'$ (procalcitonin:4,lacticidven:4) )No results found for this or any previous visit (from the past 240 hour(s)).    Radiological Exams on Admission: Dg Chest 2 View  02/25/2016  CLINICAL DATA:  Shortness of breath and lower extremity swelling EXAM: CHEST  2 VIEW COMPARISON:  November 14, 2015 FINDINGS: The heart size and mediastinal contours are stable. There is pulmonary edema. There is no focal pneumonia pneumonia or pleural effusion. There is scarring of the lateral right upper lobe unchanged. The visualized skeletal structures are stable. IMPRESSION: Pulmonary edema. Electronically Signed    By: Abelardo Diesel M.D.   On: 02/25/2016 14:01    EKG: Independently reviewed.   Active Problems:  Acute CHF (congestive heart failure) (HCC)   Acute exacerbation of CHF (congestive heart failure) (HCC)   Assessment/Plan 1. Acute versus acute on chronic CHF exacerbation, possibly diastolic versus combined systolic and diastolic. 2. Atrial fibrillation with RVR (though, lower 100's) 3. Pulmonary Hypertension 4. Possibly undiagnosed OSA/OHS 5. COPD 6. DM 7. Hypertension 8. Obesity   Admit patient for further assessment and management  IV lasix '60mg'$  Q12  Monitor renal function and electrolytes  Low threshold to consult Cardiology  Control heart rate  Continue ARB and beta blockers  Optimize BP control  Optimize blood sugar  Supplemental oxygen as needed  Manage COPD expectantly  EHCO  Low threshold for ischemic work up  DVT prophylaxis:Already on oral anticoagulants Code Status: Full Family Communication: Significant other Disposition Plan: Eventually home, depending on hospital course   Consults called: None  Admission status: Inpatient    Time spent: 60 minutes  Dana Allan, MD  [facility] Pager #: [phone] 7PM-7AM contact night coverage as above  02/25/2016, 7:15 PM

## 2016-02-26 ENCOUNTER — Inpatient Hospital Stay (HOSPITAL_COMMUNITY): Payer: Medicare Other

## 2016-02-26 DIAGNOSIS — R600 Localized edema: Secondary | ICD-10-CM

## 2016-02-26 DIAGNOSIS — I272 Other secondary pulmonary hypertension: Secondary | ICD-10-CM

## 2016-02-26 DIAGNOSIS — E1159 Type 2 diabetes mellitus with other circulatory complications: Secondary | ICD-10-CM

## 2016-02-26 DIAGNOSIS — I5033 Acute on chronic diastolic (congestive) heart failure: Secondary | ICD-10-CM

## 2016-02-26 DIAGNOSIS — G4734 Idiopathic sleep related nonobstructive alveolar hypoventilation: Secondary | ICD-10-CM

## 2016-02-26 DIAGNOSIS — I739 Peripheral vascular disease, unspecified: Secondary | ICD-10-CM

## 2016-02-26 DIAGNOSIS — J9601 Acute respiratory failure with hypoxia: Secondary | ICD-10-CM

## 2016-02-26 DIAGNOSIS — I4819 Other persistent atrial fibrillation: Secondary | ICD-10-CM | POA: Diagnosis present

## 2016-02-26 DIAGNOSIS — E669 Obesity, unspecified: Secondary | ICD-10-CM

## 2016-02-26 DIAGNOSIS — I509 Heart failure, unspecified: Secondary | ICD-10-CM

## 2016-02-26 LAB — BASIC METABOLIC PANEL
Anion gap: 8 (ref 5–15)
BUN: 11 mg/dL (ref 6–20)
CO2: 27 mmol/L (ref 22–32)
Calcium: 8.6 mg/dL — ABNORMAL LOW (ref 8.9–10.3)
Chloride: 104 mmol/L (ref 101–111)
Creatinine, Ser: 1.22 mg/dL (ref 0.61–1.24)
GFR calc Af Amer: >60 mL/min (ref >60)
GFR calc non Af Amer: >60 mL/min (ref >60)
Glucose, Bld: 129 mg/dL — ABNORMAL HIGH (ref 65–99)
Potassium: 3.8 mmol/L (ref 3.5–5.1)
Sodium: 139 mmol/L (ref 135–145)

## 2016-02-26 LAB — ECHOCARDIOGRAM LIMITED
AVPHT: 845 ms
E/e' ratio: 7.5
EWDT: 190 ms
FS: 27 % — AB (ref 28–44)
HEIGHTINCHES: 71 in
IV/PV OW: 0.84
LA diam end sys: 54 mm
LA diam index: 2.27 cm/m2
LASIZE: 54 mm
LV E/e' medial: 7.5
LVEEAVG: 7.5
LVELAT: 10.8 cm/s
MV Dec: 190
MV Peak grad: 3 mmHg
MV pk E vel: 81 m/s
MVPKAVEL: 30.6 m/s
PW: 15.4 mm — AB (ref 0.6–1.1)
TDI e' lateral: 10.8
TDI e' medial: 5.87
WEIGHTICAEL: 4270.4 [oz_av]

## 2016-02-26 MED ORDER — DILTIAZEM HCL 30 MG PO TABS
30.0000 mg | ORAL_TABLET | Freq: Three times a day (TID) | ORAL | Status: DC
  Administered 2016-02-26 – 2016-02-27 (×3): 30 mg via ORAL
  Filled 2016-02-26 (×3): qty 1

## 2016-02-26 MED ORDER — LEVALBUTEROL HCL 0.63 MG/3ML IN NEBU: 0.6300 mg | INHALATION_SOLUTION | Freq: Four times a day (QID) | RESPIRATORY_TRACT | Status: DC

## 2016-02-26 MED ORDER — IPRATROPIUM BROMIDE 0.02 % IN SOLN: 0.5000 mg | Freq: Four times a day (QID) | RESPIRATORY_TRACT | Status: DC

## 2016-02-26 MED ORDER — LEVALBUTEROL HCL 0.63 MG/3ML IN NEBU
0.6300 mg | INHALATION_SOLUTION | Freq: Three times a day (TID) | RESPIRATORY_TRACT | Status: DC
  Administered 2016-02-26 – 2016-02-27 (×4): 0.63 mg via RESPIRATORY_TRACT
  Filled 2016-02-26 (×4): qty 3

## 2016-02-26 MED ORDER — IPRATROPIUM BROMIDE 0.02 % IN SOLN
0.5000 mg | Freq: Three times a day (TID) | RESPIRATORY_TRACT | Status: DC
  Administered 2016-02-26 – 2016-02-27 (×4): 0.5 mg via RESPIRATORY_TRACT
  Filled 2016-02-26 (×4): qty 2.5

## 2016-02-26 MED ORDER — FUROSEMIDE 10 MG/ML IJ SOLN
40.0000 mg | Freq: Two times a day (BID) | INTRAMUSCULAR | Status: DC
  Administered 2016-02-26 – 2016-02-29 (×6): 40 mg via INTRAVENOUS
  Filled 2016-02-26 (×6): qty 4

## 2016-02-26 MED ORDER — COLLAGENASE 250 UNIT/GM EX OINT
TOPICAL_OINTMENT | Freq: Every day | CUTANEOUS | Status: DC
  Administered 2016-02-26 – 2016-02-29 (×4): via TOPICAL
  Filled 2016-02-26: qty 30

## 2016-02-26 NOTE — Progress Notes (Signed)
[facility] Progress Note  Patient: Neil Carroll WEX:[id number]   PCP: Hoyt Koch, MD DOB: Mar 28, 1951   DOA: 02/25/2016   DOS: 02/26/2016   Date of Service: the patient was seen and examined on 02/26/2016  Subjective: Patient mentions his breathing is significantly better. His leg swelling is also getting better. No PND, orthopnea getting better. No chest pain. Nausea and vomiting and abdominal pain. Nutrition: Tolerating oral diet  Brief hospital course: Patient was admitted on 02/25/2016, with complaint of shortness of breath as well as leg swelling, was found to have acute on chronic diastolic dysfunction. Currently further plan is continue IV Lasix.  Assessment and Plan: 1. Acute on chronic diastolic heart failure (HCC) Continue IV Lasix, dose reduced from 60 mg twice a day to 40 mg twice a day. The patient currently consulted. Next and continue pedal broke her for rate control. Next I'll monitor on telemetry. Strict ins and outs and daily weight. Limited echocardiogram to check for ejection fraction.  2. Type 2 diabetes mellitus with peripheral vascular disease.  Sugars are reasonably controlled. We will continue with Levemir 24 units daily at bedtime as well as sliding scale insulin.  3. History of chronic A. fib. Currently continue rate control. On the vertex of).  4. Peripheral vascular disease. Continue Pletal.  5. Essential hypertension. Continue losartan. Continue Lopressor. Next and cardiology has started the patient on Cardizem.  6. BPH. Continue Flomax.  7. History of COPD.  Due to bilateral expiratory wheezing I will add DuoNeb's. Next and monitor for improvement. It does not improve at antibiotic.  Pain management: When necessary Tylenol Activity: Consulted physical therapy Bowel regimen: last BM prior to admission Diet: Cardiac and carb modified diet DVT Prophylaxis: on therapeutic anticoagulation.  Advance goals of care discussion:  Full code  Family Communication: no family was present at bedside, at the time of interview.   Disposition:  Discharge to home, likely home health Expected discharge date: 02/28/2016  Consultants: Cardiology Procedures: Echocardiogram pending  Antibiotics: Anti-infectives    None        Intake/Output Summary (Last 24 hours) at 02/26/16 1805 Last data filed at 02/26/16 1700  Gross per 24 hour  Intake   1680 ml  Output   4775 ml  Net  -3095 ml   Filed Weights   02/25/16 1603 02/26/16 1517  Weight: 121.065 kg (266 lb 14.4 oz) 119.069 kg (262 lb 8 oz)    Objective: Physical Exam: Filed Vitals:   02/26/16 0523 02/26/16 0808 02/26/16 1509 02/26/16 1517  BP: 131/71   112/62  Pulse: 102 113 110 60  Temp: 98.7 F (37.1 C)     TempSrc: Oral     Resp: '18 18 20 20  '$ Height:      Weight:    119.069 kg (262 lb 8 oz)  SpO2: 93% 92% 93% 97%    General: Alert, Awake and Oriented to Time, Place and Person. Appear in moderate distress Eyes: PERRL, Conjunctiva normal ENT: Oral Mucosa clear moist. Neck: difficult to assess JVD, no Abnormal Mass Or lumps Cardiovascular: S1 and S2 Present, aortic systolic Murmur, Peripheral Pulses Present Respiratory: Bilateral Air entry equal and Decreased, bilateral Crackles, bilateral wheezes Abdomen: Bowel Sound present, Soft and no tenderness Skin: no redness, no Rash  Extremities: bilateral Pedal edema, no calf tenderness Neurologic: Grossly no focal neuro deficit. Bilaterally Equal motor strength   Data Reviewed: CBC:  Recent Labs Lab 02/25/16 1227  WBC 7.6  HGB 14.8  HCT  45.0  MCV 91.5  PLT 903   Basic Metabolic Panel:  Recent Labs Lab 02/25/16 1227 02/26/16 0522  NA 141 139  K 3.8 3.8  CL 112* 104  CO2 22 27  GLUCOSE 110* 129*  BUN 12 11  CREATININE 1.18 1.22  CALCIUM 8.8* 8.6*    Liver Function Tests: No results for input(s): AST, ALT, ALKPHOS, BILITOT, PROT, ALBUMIN in the last 168 hours. No results for  input(s): LIPASE, AMYLASE in the last 168 hours. No results for input(s): AMMONIA in the last 168 hours. Coagulation Profile: No results for input(s): INR, PROTIME in the last 168 hours. Cardiac Enzymes: No results for input(s): CKTOTAL, CKMB, CKMBINDEX, TROPONINI in the last 168 hours. BNP (last 3 results) No results for input(s): PROBNP in the last 8760 hours.  CBG:  Recent Labs Lab 02/25/16 1659 02/25/16 2150  GLUCAP 107* 95    Studies: No results found.   Scheduled Meds: . cilostazol  100 mg Oral BID  . clobetasol   Topical BID  . collagenase   Topical Daily  . diltiazem  30 mg Oral Q8H  . furosemide  40 mg Intravenous BID  . insulin detemir  24 Units Subcutaneous QHS  . ipratropium  0.5 mg Nebulization TID  . levalbuterol  0.63 mg Nebulization TID  . losartan  100 mg Oral Daily  . metoprolol  100 mg Oral BID  . mometasone-formoterol  2 puff Inhalation BID  . multivitamin with minerals  1 tablet Oral Daily  . rivaroxaban  20 mg Oral Q supper  . rosuvastatin  10 mg Oral Daily  . sodium chloride flush  3 mL Intravenous Q12H  . tamsulosin  0.4 mg Oral Daily   Continuous Infusions:  PRN Meds: sodium chloride, acetaminophen, mupirocin ointment, ondansetron (ZOFRAN) IV, sodium chloride flush  Time spent: 30 minutes  Author: Berle Mull, MD [facility] Pager: [phone] 02/26/2016 6:05 PM  If 7PM-7AM, please contact night-coverage at [email or web address], password [email or web address]

## 2016-02-26 NOTE — Consult Note (Signed)
Patient ID: Neil Housekeeper Sr. MRN: [id number], DOB/AGE: 66-Aug-1952   Admit date: 02/25/2016   Reason for Consult: CHF and Atrial Fibrillation w/ RVR Requesting MD: Dr. Marthenia Rolling, Internal Medicine   Primary Physician: Hoyt Koch, MD Primary Cardiologist: Dr. Burt Knack Electrophysiologist: Dr. Curt Bears  Pt. Profile:  65 y/o AA male with h/o CAD, PVD, paroxysmal atrial flutter/fibrillation with 2 failed ablations, chronic anticoagulation therapy with Xarelto, chronic diastolic HF, HTN, HLD, DM, COPD and ongoing tobacco abuse, admitted for acute on chronic diastolic CHF and atrial firbrillation with RVR.   Problem List  Past Medical History  Diagnosis Date  . Other peripheral vascular disease(443.89)     bilateral lower extremity  . Hyperlipidemia   . Hypertension   . Transient global amnesia   . Contact dermatitis and other eczema due to plants (except food)   . Tobacco use disorder     dependent  . Atrial flutter ([place])     onset  2011. s/p EPS/RFA 12/2011  . Type II diabetes mellitus ([place])   . Chronic bronchitis   . GERD (gastroesophageal reflux disease)   . LV dysfunction     EF 45-50% 12/2011  . Adenomatous colon polyp   . COPD (chronic obstructive pulmonary disease) ([place])   . Arthritis     left hip replacement  . Cataract   . Tuberculosis     Past Surgical History  Procedure Laterality Date  . Partial hip arthroplasty Right 01/2000    "Right; replaced ball & stem"  . Partial hip arthroplasty Left   . Cystoscopy  11/2007    Dr Jeffie Pollock  . Cardiac electrophysiology mapping and ablation  03/2010  . Tee without cardioversion  12/28/2011    Procedure: TRANSESOPHAGEAL ECHOCARDIOGRAM (TEE);  Surgeon: Larey Dresser, MD;  Location: [facility];  Service: Cardiovascular;  Laterality: N/A;  . Colonoscopy with polypectomy  2006 & 2011    Dr [name]  . A flutter ablation N/A 12/28/2011    Procedure: ABLATION A FLUTTER;  Surgeon: Evans Lance, MD;  Location: [facility] CATH  LAB;  Service: Cardiovascular;  Laterality: N/A;  . Colonoscopy    . Polypectomy    . Cardiac catheterization N/A 07/10/2015    Procedure: Left Heart Cath and Coronary Angiography;  Surgeon: Sherren Mocha, MD;  Location: [facility] CV LAB;  Service: Cardiovascular;  Laterality: N/A;     Allergies  Allergies  Allergen Reactions  . Niacin Nausea Only    REACTION: upset stomach    HPI  65 y/o male followed by Dr. Burt Knack, who has a history of atrial flutter status post redo frequency ablation, peripheral arterial disease with known SFA occlusive disease bilaterally, HTN, DM, COPD, tobacco use and CAD. He recently underwent a [place] by Dr. Burt Knack 06/2015. This showed severe distal left circumflex stenosis - recommend medical therapy as small amount of myocardium supplied, mild nonobstructive stenosis of a large, wrap around LAD and total occlusion of a small, codominant RCA. Dr. Burt Knack did not feel that PCI would significantly impact symptoms or overall clinical outcome, thus medical therapy was elected. He has been maintained on Plavix, metoprolol, Crestor and Losartan. He also takes Pletal for his PVD.   He was admitted in February of this year to [facility] by Internal Medicine. He presented with complaint of dyspnea. BNP was normal at 52. He was admitted for presumed COPD exacerbation and treated with prednisone and antibiotics. He was discharged from the hospital on 11/10/15. He presented back to the ED  2/23 with a complaint of palpitations. EKG showed atrial flutter with a RVR of 134 bpm. CBC was negative for anemia. BMP showed normal K and renal function. K was 4.8. He left the ED before being seen by an ED physician as his symptoms resolved and he was feeling better and he was tired of waiting. He was seen in cardiology clinic the following day on 2/24 for ED f/u.  EKG showed NSR. HR was controlled at 76 bpm. QT/QTc was WNL at 394/443 ms. He was asymptomatic. Mg was checked and was stable at 2.1. TSH  was also normal. He was ordered to wear a 30 day monitor to assess afib burden given his detection of atrial flutter on ED EKG. He was observed for a total duration of 707 hr. Afib burden was 31%. No SVT or VT noted. He was referred back to EP clinic, given afib/flutter recurrence despite 2 ablations. He was seen by Dr. Curt Bears. He reviewed his monitor and felt that his atrial flutter was atypical. He recommended anticoagulation and patient was started on Xarelto for stroke prophylaxis. He also recommended AAD therapy with either Amiodarone or Tikosyn for rhythm control, however the patient declined. Dr. Curt Bears also ordered a 2D echo which showed normal LVEF of 60-65%. Normal wall motion and no significant valve abnormalities. Dr. Curt Bears also ordered a sleep study but the patient has not had this done yet. He has EP f/u scheduled next month.   The patient presented to [facility] ED [facility]/25/16 with a complaint of a 3-4 week h/o progressive dyspnea, orthopnea and bilateral LEE. On presentation, patient was also noted to be in atrial fibrillation with a RVR of 103 bpm. CXR shows pulmonary edema. BNP is elevated at 226. BMP suggest normal renal function with SCr at 1.22 and BUN at 11. He was admitted to telemetry by IM. He is on IV lasix, 40 mg BID. Good UOP with -3L out thus far. Telemetry shows afib with Vrate in the 90s. He's on metoprolol 100 mg BID. His Xarelto has been continued. He denies any recent CP.   Home Medications  Prior to Admission medications   Medication Sig Start Date End Date Taking? Authorizing Provider  acetaminophen (TYLENOL) 650 MG CR tablet Take 650 mg by mouth every 8 (eight) hours as needed for pain.   Yes Historical Provider, MD  cilostazol (PLETAL) 100 MG tablet Take 1 tablet (100 mg total) by mouth 2 (two) times daily. 10/25/15  Yes Hoyt Koch, MD  clobetasol (OLUX) 0.05 % topical foam Apply topically 2 (two) times daily.  12/27/14  Yes Historical Provider, MD  Flaxseed,  Linseed, (FLAX SEED OIL) 1000 MG CAPS Take 1 capsule by mouth 2 (two) times daily.    Yes Historical Provider, MD  Fluticasone-Salmeterol (ADVAIR) 100-50 MCG/DOSE AEPB Inhale 1 puff into the lungs 2 (two) times daily.   Yes Historical Provider, MD  furosemide (LASIX) 40 MG tablet Take 1 tablet (40 mg total) by mouth daily. 09/30/15  Yes Hoyt Koch, MD  Insulin Detemir (LEVEMIR FLEXTOUCH) 100 UNIT/ML Pen INJECT 24 UNITS INTO THE   SKIN DAILY 07/24/15  Yes Elayne Snare, MD  losartan (COZAAR) 100 MG tablet take 1 tablet by mouth once daily 07/24/15  Yes Sherren Mocha, MD  metformin (FORTAMET) 1000 MG (OSM) 24 hr tablet TAKE 1 TABLET TWICE A DAY 09/24/15  Yes Elayne Snare, MD  metoprolol (LOPRESSOR) 100 MG tablet take 1 tablet by mouth twice a day 08/21/15  Yes Sherren Mocha,  MD  Multiple Vitamins-Minerals (CENTRUM ADULTS) TABS Take 1 tablet by mouth daily.   Yes Historical Provider, MD  Omega-3 Fatty Acids (FISH OIL) 1000 MG CAPS Take 2 capsules by mouth 2 (two) times daily.    Yes Historical Provider, MD  rivaroxaban (XARELTO) 20 MG TABS tablet Take 1 tablet (20 mg total) by mouth daily with supper. Patient taking differently: Take 20 mg by mouth daily.  01/03/16  Yes Will Meredith Leeds, MD  rosuvastatin (CRESTOR) 10 MG tablet Take 1 tablet (10 mg total) by mouth daily. 10/28/15  Yes Hoyt Koch, MD  tamsulosin (FLOMAX) 0.4 MG CAPS capsule Take 0.4 mg by mouth daily.    Yes Historical Provider, MD  VICTOZA 18 MG/3ML SOPN INJECT 0.3ML (=1.8MG)      SUBCUTANEOUSLY DAILY 10/23/15  Yes Elayne Snare, MD  Insulin Pen Needle (NOVOFINE) 32G X 6 MM MISC Use 2 needles daily with Levimir pen and Victoza pen  DX: E11.69 05/17/15   Elayne Snare, MD  mupirocin ointment (BACTROBAN) 2 % Apply 1 application topically 2 (two) times daily as needed (affected areas).     Historical Provider, MD  ONE TOUCH ULTRA TEST test strip TEST once daily 02/20/16   Hoyt Koch, MD  triamcinolone cream (KENALOG) 0.1 % Apply  1 application topically 2 (two) times daily as needed (affected area).     Historical Provider, MD   Hospital Meds . cilostazol  100 mg Oral BID  . clobetasol   Topical BID  . collagenase   Topical Daily  . furosemide  40 mg Intravenous BID  . insulin detemir  24 Units Subcutaneous QHS  . ipratropium  0.5 mg Nebulization TID  . levalbuterol  0.63 mg Nebulization TID  . losartan  100 mg Oral Daily  . metoprolol  100 mg Oral BID  . mometasone-formoterol  2 puff Inhalation BID  . multivitamin with minerals  1 tablet Oral Daily  . rivaroxaban  20 mg Oral Q supper  . rosuvastatin  10 mg Oral Daily  . sodium chloride flush  3 mL Intravenous Q12H  . tamsulosin  0.4 mg Oral Daily     Family History  Family History  Problem Relation Age of Onset  . Stroke Mother 41  . Hypertension Mother   . Diabetes Mother   . Lung cancer Father     smoker  . Diabetes Paternal Grandmother   . Diabetes Brother     MI @ 57  . Diabetes Brother     Hepatitis C  . Throat cancer Paternal Uncle     1/2 uncle  . Heart attack Brother 32  . Colon cancer Neg Hx   . Esophageal cancer Neg Hx   . Rectal cancer Neg Hx   . Stomach cancer Neg Hx   . Crohn's disease Son     Social History  Social History   Social History  . Marital Status: Widowed    Spouse Name: N/A  . Number of Children: 5  . Years of Education: N/A   Occupational History  . security guard    Social History Main Topics  . Smoking status: Current Every Day Smoker -- 1.00 packs/day for 44 years    Types: Cigarettes  . Smokeless tobacco: Never Used     Comment: started @ age 31, up to 2 ppd;1.25-1.5 ppd as of 11/23/13  . Alcohol Use: 0.0 oz/week    0 Standard drinks or equivalent per week     Comment:  11/23/13 "  2-3 drinks per year"  . Drug Use: No  . Sexual Activity: Not Currently   Other Topics Concern  . Not on file   Social History Narrative     Review of Systems General:  No chills, fever, night sweats or weight  changes.  Cardiovascular:  No chest pain, dyspnea on exertion, edema, orthopnea, palpitations, paroxysmal nocturnal dyspnea. Dermatological: No rash, lesions/masses Respiratory: No cough, dyspnea Urologic: No hematuria, dysuria Abdominal:   No nausea, vomiting, diarrhea, bright red blood per rectum, melena, or hematemesis Neurologic:  No visual changes, wkns, changes in mental status. All other systems reviewed and are otherwise negative except as noted above.  Physical Exam  Blood pressure 131/71, pulse 113, temperature 98.7 F (37.1 C), temperature source Oral, resp. rate 18, height _0  (1.803 m), weight 266 lb 14.4 oz (121.065 kg), SpO2 92 %.  General: Pleasant, NAD, moderately obese Psych: Normal affect. Neuro: Alert and oriented X 3. Moves all extremities spontaneously. HEENT: Normal  Neck: Supple without bruits or JVD. Lungs:  Decreased BS at the bases with mild diffuse expiratory wheezing bilaterally Heart:  irregularly irregular, regular rate no s3, s4, or murmurs. Abdomen: Soft, non-tender, non-distended, BS + x 4.  Extremities: No clubbing, cyanosis. DP/PT/Radials 2+ and equal bilaterally. 2+ bilateral LEE  Labs  Troponin (Point of Care Test)  Recent Labs  02/25/16 1334  TROPIPOC 0.01   No results for input(s): CKTOTAL, CKMB, TROPONINI in the last 72 hours. Lab Results  Component Value Date   WBC 7.6 02/25/2016   HGB 14.8 02/25/2016   HCT 45.0 02/25/2016   MCV 91.5 02/25/2016   PLT 157 02/25/2016    Hepatic Function Latest Ref Rng 09/19/2015 01/31/2015 01/29/2015  Total Protein 6.0 - 8.3 g/dL 7.5 7.3 7.4  Albumin 3.5 - 5.2 g/dL 4.1 4.0 4.0  AST 0 - 37 U/L _1 ALT 0 - 53 U/L 18 34 36  Alk Phosphatase 39 - 117 U/L 45 42 43  Total Bilirubin 0.2 - 1.2 mg/dL 0.3 0.3 0.3  Bilirubin, Direct 0.0 - 0.3 mg/dL - - 0.1     Recent Labs Lab 02/26/16 0522  NA 139  K 3.8  CL 104  CO2 27  BUN 11  CREATININE 1.22  CALCIUM 8.6*  GLUCOSE 129*   Lab Results   Component Value Date   CHOL 110 05/29/2015   HDL 38.20* 05/29/2015   LDLCALC 53 05/29/2015   TRIG 96.0 05/29/2015   No results found for: DDIMER   Radiology/Studies  Dg Chest 2 View  02/25/2016  CLINICAL DATA:  Shortness of breath and lower extremity swelling EXAM: CHEST  2 VIEW COMPARISON:  November 14, 2015 FINDINGS: The heart size and mediastinal contours are stable. There is pulmonary edema. There is no focal pneumonia pneumonia or pleural effusion. There is scarring of the lateral right upper lobe unchanged. The visualized skeletal structures are stable. IMPRESSION: Pulmonary edema. Electronically Signed   By: Abelardo Diesel M.D.   On: 02/25/2016 14:01    ECG  Admit EKG: Atrial fibrillation, HR 103 bpm Current Telemetry: atrial fibrillation, HR in the 90s.   ASSESSMENT AND PLAN  Active Problems:   Acute CHF (congestive heart failure) (HCC)   Acute exacerbation of CHF (congestive heart failure) (HCC)   Atrial fibrillation ([place])   1. Acute on Chronic Diastolic HF: recent 2D echo 01/21/2016 showed normal LVEF of 60-65%. He is volume overloaded with significant 2+ bilateral LEE pitting edema. Renal function is normal. He has  had good UOP thus far with IV lasix. I/Os net negative 3L since admit yesterday. Continue IV Lasix for further diuresis, 40 mg BID. Continue to monitor strict I/Os and daily weights. Low sodium diet. Monitor renal function, BP and electrolytes closely as we diuresis. Continue BB and ARB for BP control. Acute exacerbation is likely secondary to poorly controlled atrial arrhthymias and also likely dietary indiscretion with sodium.   2. Paroxsymal Atrial Fibrillation/Flutter: patient has had 2 failed afib ablations in the past. He is currently followed by Dr. Curt Bears. He is currently on rate control therapy but may need to consider rhythm strategy with either Amiodarone or Tikosyn. His rate is currently in the 90s on metoprolol, 100 mg BID. Given normal LVEF, can also  consider the addition of Cardizem for additional rate control. Continue Xarelto for a/c given CHA2DS2 VASc score of 5 (CHF, HTN, DM, Age 7-74 and Vascular disease). Will check a TSH level. Agree that he needs a sleep study to f/o OSA. This is scheduled for 7/14.   3. HTN: BP is currently well controlled on current regimen. Losartan, metoprolol and lasix.  4. DM: on insulin. Management per IM.   5. CAD: He recently underwent a [place] by Dr. Burt Knack 06/2015. This showed severe distal left circumflex stenosis - recommend medical therapy as small amount of myocardium supplied, mild nonobstructive stenosis of a large, wrap around LAD and total occlusion of a small, codominant RCA. Dr. Burt Knack did not feel that PCI would significantly impact symptoms or overall clinical outcome, thus medical therapy was elected. He has been maintained on metoprolol, Crestor and Losartan. Plavix discontinued after start of Xarelto. He denies any recent CP. Continue medical therapy.    Signed, Lyda Jester, PA-C 02/26/2016, 11:15 AM   Patient seen and examined. Agree with assessment and plan.  Neil Carroll is a 65 year old African-American male who has a history of obesity, hypertension, diabetes mellitus, COPD, CAD, atrial flutter and atrial fibrillation status post ablation 2 and ongoing tobacco use.  The patient has developed progressive symptoms of increasing shortness of breath.  He had recently been seen by Dr. Curt Bears and has had recurrent atrial flutter and also atrial fibrillation.  He was started on Xarelto anticoagulation.  A 2-D echo Doppler study revealed normal systolic function with normal wall motion; aortic valve sclerosis without stenosis,upper normal atrial size and coronary hypertension with an estimated PA pressure 39 mm.  There has been some discussion in the past of possibly instituting amiodarone or Tikosyn for his atrial arrhythmias.  He had previously undergone cardiac catheterization by Dr.  Burt Knack and medical therapy was recommended.  The patient admits to progressive lower extremity edema , and yesterday was found to have oxygen desaturation by his primary physician leading to his hospitalization.  Presently, he denies any chest pain.  He is mildly short of breath.  His blood pressure has been variable and ranging from 751-025 systolically with diastolic pressures in the 80s.  He is in atrial fibrillation with a ventricular rate 95-105.  HEENT is notable for a Mallinpatti scale of 3.  There is mild JVD increase of 8 cm.  He had diffuse rhonchi throughout all lung fields without audible wheezes.  Rhythm was irregularly irregular at 100 bpm with a 1/6 systolic murmur.  There was moderate central adiposity.  He did not have hepatojugular reflux.  He has 2+ lower extremity edema in his legs are wrapped.  Neurologic exam is grossly nonfocal.  His ECG independently reviewed by me  reveals atrial fibrillation at 103 bpm with nonspecific ST changes.  QTc interval is 472 ms.  Troponin is negative.  Renal function is normal with a creatinine of 1.22 with a GFR greater than 60.  BMP is mildly increased at 226, compatible with diastolic heart failure.  At present, agree with IV furosemide.  With normal systolic function, will add oral Cardizem for additional rate control benefit.  With his lung disease amiodarone may be be an issue for future antiarrhythmic therapy.  I discussed the importance of smoking cessation.  I suspect he may have obstructive sleep apnea, which may contribute to oxygen desaturation nocturnally.  He already is scheduled for sleep study as an outpatient on 04/03/2016.  Continue Xarelto for anticoagulation long term with a CHA2DS2 VASc score of 5.   Troy Sine, MD, [facility] 02/26/2016 2:20 PM

## 2016-02-26 NOTE — Progress Notes (Signed)
Pt did not want to take Neb Tx while RT was in room to administer it.  Pt took Dulera inhaler at 1919 and stated he would do the neb Tx later.  RT to monitor and assess as needed.

## 2016-02-26 NOTE — Consult Note (Signed)
WOC wound consult note Reason for Consult: Chronic mixed venous and peripheral vascular disease.  Lower extremity ulcer to right medial lower leg x 2.  Generalized edema present, patient has compression garments at home but states he only occasionally wears them.   Wound type: Chronic venous/arterial insufficiency with ulceration.  Pressure Ulcer POA:N/A Measurement:Distal lower leg 2 cm x 2.4 cm x 0.2 cm  Proximal lower leg 1.4 cm x 0.5 cm x 0.2 cm  Left posterior leg with scabbed lesion 0.5 cm in diameter.  Wound BSJ:GGEZM red Generalized edema to bilateral lower legs.  Currently diuresing and indicates his urinary output has increased.   Drainage (amount, consistency, odor) Minimal serosanguinous  No odor.  Periwound:Edema, tenderness.  Dressing procedure/placement/frequency:Cleanse bilateral lower legs with soap and water.  Santyl ointment to open lesions on left and right lower leg.  Cover with 4x4 gauze, kerlix and tape.  Will not follow at this time.  Please re-consult if needed.  Domenic Moras RN BSN [place] Pager [phone]

## 2016-02-26 NOTE — Progress Notes (Signed)
*  PRELIMINARY RESULTS* Echocardiogram 2D Echocardiogram has been performed.  Leavy Cella 02/26/2016, 3:12 PM

## 2016-02-26 NOTE — Progress Notes (Signed)
*  PRELIMINARY RESULTS* Echocardiogram Echo has been performed.  Neil Carroll 02/26/2016, 3:11 PM

## 2016-02-27 ENCOUNTER — Inpatient Hospital Stay (HOSPITAL_COMMUNITY): Payer: Medicare Other

## 2016-02-27 DIAGNOSIS — I4821 Permanent atrial fibrillation: Secondary | ICD-10-CM | POA: Diagnosis present

## 2016-02-27 DIAGNOSIS — Z7901 Long term (current) use of anticoagulants: Secondary | ICD-10-CM

## 2016-02-27 DIAGNOSIS — R600 Localized edema: Secondary | ICD-10-CM

## 2016-02-27 DIAGNOSIS — I2510 Atherosclerotic heart disease of native coronary artery without angina pectoris: Secondary | ICD-10-CM

## 2016-02-27 DIAGNOSIS — I4891 Unspecified atrial fibrillation: Secondary | ICD-10-CM

## 2016-02-27 DIAGNOSIS — I482 Chronic atrial fibrillation: Secondary | ICD-10-CM

## 2016-02-27 LAB — BASIC METABOLIC PANEL
ANION GAP: 9 (ref 5–15)
BUN: 14 mg/dL (ref 6–20)
CHLORIDE: 102 mmol/L (ref 101–111)
CO2: 28 mmol/L (ref 22–32)
Calcium: 8.6 mg/dL — ABNORMAL LOW (ref 8.9–10.3)
Creatinine, Ser: 1.18 mg/dL (ref 0.61–1.24)
GFR calc Af Amer: >60 mL/min (ref >60)
GLUCOSE: 135 mg/dL — AB (ref 65–99)
POTASSIUM: 3.9 mmol/L (ref 3.5–5.1)
Sodium: 139 mmol/L (ref 135–145)

## 2016-02-27 LAB — CBC
HEMATOCRIT: 43.8 % (ref 39.0–52.0)
HEMOGLOBIN: 14.6 g/dL (ref 13.0–17.0)
MCH: 29.7 pg (ref 26.0–34.0)
MCHC: 33.3 g/dL (ref 30.0–36.0)
MCV: 89 fL (ref 78.0–100.0)
Platelets: 157 10*3/uL (ref 150–400)
RBC: 4.92 MIL/uL (ref 4.22–5.81)
RDW: 13.8 % (ref 11.5–15.5)
WBC: 8.1 10*3/uL (ref 4.0–10.5)

## 2016-02-27 LAB — MAGNESIUM: Magnesium: 2.2 mg/dL (ref 1.7–2.4)

## 2016-02-27 LAB — GLUCOSE, CAPILLARY: Glucose-Capillary: 131 mg/dL — ABNORMAL HIGH (ref 65–99)

## 2016-02-27 MED ORDER — LEVALBUTEROL HCL 0.63 MG/3ML IN NEBU
0.6300 mg | INHALATION_SOLUTION | Freq: Two times a day (BID) | RESPIRATORY_TRACT | Status: DC
  Administered 2016-02-27 – 2016-02-29 (×4): 0.63 mg via RESPIRATORY_TRACT
  Filled 2016-02-27 (×4): qty 3

## 2016-02-27 MED ORDER — IPRATROPIUM BROMIDE 0.02 % IN SOLN
0.5000 mg | Freq: Two times a day (BID) | RESPIRATORY_TRACT | Status: DC
  Administered 2016-02-27 – 2016-02-29 (×4): 0.5 mg via RESPIRATORY_TRACT
  Filled 2016-02-27 (×4): qty 2.5

## 2016-02-27 MED ORDER — ETANERCEPT 50 MG/ML ~~LOC~~ SOSY
50.0000 mg | PREFILLED_SYRINGE | SUBCUTANEOUS | Status: DC
  Administered 2016-02-27: 50 mg via SUBCUTANEOUS

## 2016-02-27 MED ORDER — DILTIAZEM HCL 60 MG PO TABS
60.0000 mg | ORAL_TABLET | Freq: Three times a day (TID) | ORAL | Status: DC
  Administered 2016-02-27 – 2016-02-28 (×3): 60 mg via ORAL
  Filled 2016-02-27 (×3): qty 1

## 2016-02-27 NOTE — Progress Notes (Signed)
VASCULAR LAB PRELIMINARY  PRELIMINARY  PRELIMINARY  PRELIMINARY  Bilateral lower extremity venous duplex completed.     Bilateral:  No evidence of DVT, superficial thrombosis, or Baker's Cyst.    Janifer Adie, RVT, RDMS 02/27/2016, 1:59 PM

## 2016-02-27 NOTE — Progress Notes (Signed)
[facility] Progress Note  Patient: Neil Carroll [id number]:[id number]   PCP: Hoyt Koch, MD DOB: 07-20-1951   DOA: 02/25/2016   DOS: 02/27/2016   Date of Service: the patient was seen and examined on 02/27/2016  Subjective: Shortness of breath as well as leg swelling is getting better. No chest pain or palpitation. Continue Serevent tachycardic on telemetry. No constipation no nausea vomiting Nutrition: Tolerating oral diet  Brief hospital course: Patient was admitted on 02/25/2016, with complaint of shortness of breath as well as leg swelling, was found to have acute on chronic diastolic dysfunction. Currently further plan is continue IV Lasix.  Assessment and Plan: 1. Acute on chronic diastolic heart failure (HCC) Continue IV Lasix, 40 mg twice a day. Appreciate cartilage input Strict ins and outs and daily weight. echocardiogram shows normal ejection fraction with diastolic dysfunction. Likely from poor rate controlled  2. Type 2 diabetes mellitus with peripheral vascular disease.  Sugars are reasonably controlled. We will continue with Levemir 24 units daily at bedtime as well as sliding scale insulin.  3. History of chronic A. Fib. CHA2DS2-VASc Score 5 Currently continue rate control. Patient on metoprolol at home, started on Cardizem here by cardiology and the dose will be increased today due to poor rate controlled. TSH normal. Continue Xarelto  4. Peripheral vascular disease. Continue Pletal.  5. Essential hypertension. Continue losartan. Continue Lopressor.  cardiology has started the patient on Cardizem.  6. BPH. Continue Flomax.  7. History of COPD.  Due to bilateral expiratory wheezing I will add DuoNeb's. monitor for improvement.  Pain management: When necessary Tylenol Activity: Consulted physical therapy Bowel regimen: last BM prior to admission Diet: Cardiac and carb modified diet DVT Prophylaxis: on therapeutic  anticoagulation.  Advance goals of care discussion: Full code  Family Communication: no family was present at bedside, at the time of interview.   Disposition:  Discharge to home, likely home health Expected discharge date: 02/29/2016  Consultants: Cardiology Procedures: Echocardiogram   Antibiotics: Anti-infectives    None        Intake/Output Summary (Last 24 hours) at 02/27/16 1257 Last data filed at 02/27/16 0823  Gross per 24 hour  Intake   1320 ml  Output   2250 ml  Net   -930 ml   Filed Weights   02/25/16 1603 02/26/16 1517 02/27/16 0651  Weight: 121.065 kg (266 lb 14.4 oz) 119.069 kg (262 lb 8 oz) 118.343 kg (260 lb 14.4 oz)    Objective: Physical Exam: Filed Vitals:   02/27/16 0423 02/27/16 0651 02/27/16 0830 02/27/16 0944  BP: 110/80   148/84  Pulse: 99  93 98  Temp: 98.2 F (36.8 C)     TempSrc: Oral     Resp: 18  20   Height:      Weight:  118.343 kg (260 lb 14.4 oz)    SpO2: 94%  93%     General: Alert, Awake and Oriented to Time, Place and Person. Appear in moderate distress Eyes: PERRL, Conjunctiva normal ENT: Oral Mucosa clear moist. Neck: difficult to assess JVD, no Abnormal Mass Or lumps Cardiovascular: S1 and S2 Present, aortic systolic Murmur, Peripheral Pulses Present Respiratory: Bilateral Air entry equal and Decreased, bilateral Crackles, No wheezes Abdomen: Bowel Sound present, Soft and no tenderness Skin: no redness, no Rash  Extremities: bilateral Pedal edema, no calf tenderness Neurologic: Grossly no focal neuro deficit. Bilaterally Equal motor strength   Data Reviewed: CBC:  Recent Labs Lab 02/25/16 1227 02/27/16 0417  WBC 7.6 8.1  HGB 14.8 14.6  HCT 45.0 43.8  MCV 91.5 89.0  PLT 157 484   Basic Metabolic Panel:  Recent Labs Lab 02/25/16 1227 02/26/16 0522 02/27/16 0417  NA 141 139 139  K 3.8 3.8 3.9  CL 112* 104 102  CO2 '22 27 28  '$ GLUCOSE 110* 129* 135*  BUN '12 11 14  '$ CREATININE 1.18 1.22 1.18  CALCIUM  8.8* 8.6* 8.6*  MG  --   --  2.2   CBG:  Recent Labs Lab 02/25/16 1659 02/25/16 2150  GLUCAP 107* 95    Studies: No results found.   Scheduled Meds: . cilostazol  100 mg Oral BID  . collagenase   Topical Daily  . diltiazem  60 mg Oral Q8H  . furosemide  40 mg Intravenous BID  . insulin detemir  24 Units Subcutaneous QHS  . ipratropium  0.5 mg Nebulization TID  . levalbuterol  0.63 mg Nebulization TID  . losartan  100 mg Oral Daily  . metoprolol  100 mg Oral BID  . mometasone-formoterol  2 puff Inhalation BID  . multivitamin with minerals  1 tablet Oral Daily  . rivaroxaban  20 mg Oral Q supper  . rosuvastatin  10 mg Oral Daily  . sodium chloride flush  3 mL Intravenous Q12H  . tamsulosin  0.4 mg Oral Daily   Continuous Infusions:  PRN Meds: sodium chloride, acetaminophen, mupirocin ointment, ondansetron (ZOFRAN) IV, sodium chloride flush  Time spent: 30 minutes  Author: Berle Mull, MD [facility] Pager: [phone] 02/27/2016 12:57 PM  If 7PM-7AM, please contact night-coverage at [email or web address], password [facility]

## 2016-02-27 NOTE — Progress Notes (Signed)
Subjective:  SOB improving  Objective:  Vital Signs in the last 24 hours: Temp:  [98.2 F (36.8 C)-98.9 F (37.2 C)] 98.2 F (36.8 C) (06/08 0423) Pulse Rate:  [60-112] 93 (06/08 0830) Resp:  [18-20] 20 (06/08 0830) BP: (109-112)/(62-80) 110/80 mmHg (06/08 0423) SpO2:  [93 %-97 %] 93 % (06/08 0830) Weight:  [260 lb 14.4 oz (118.343 kg)-262 lb 8 oz (119.069 kg)] 260 lb 14.4 oz (118.343 kg) (06/08 0651)  Intake/Output from previous day:  Intake/Output Summary (Last 24 hours) at 02/27/16 0910 Last data filed at 02/27/16 5993  Gross per 24 hour  Intake   1320 ml  Output   2975 ml  Net  -1655 ml    Physical Exam: General appearance: alert, cooperative, no distress, moderately obese and on O2 Lungs: diffuse rhonchi Heart: regular rate and rhythm Extremities: chronic venous insufficiency with ulcers- dressings on both LE Neurologic: Grossly normal   Rate: 100-130  Rhythm: atrial fibrillation  Lab Results:  Recent Labs  02/25/16 1227 02/27/16 0417  WBC 7.6 8.1  HGB 14.8 14.6  PLT 157 157    Recent Labs  02/26/16 0522 02/27/16 0417  NA 139 139  K 3.8 3.9  CL 104 102  CO2 27 28  GLUCOSE 129* 135*  BUN 11 14  CREATININE 1.22 1.18   No results for input(s): TROPONINI in the last 72 hours.  Invalid input(s): CK, MB No results for input(s): INR in the last 72 hours.  Scheduled Meds: . cilostazol  100 mg Oral BID  . clobetasol   Topical BID  . collagenase   Topical Daily  . diltiazem  30 mg Oral Q8H  . furosemide  40 mg Intravenous BID  . insulin detemir  24 Units Subcutaneous QHS  . ipratropium  0.5 mg Nebulization TID  . levalbuterol  0.63 mg Nebulization TID  . losartan  100 mg Oral Daily  . metoprolol  100 mg Oral BID  . mometasone-formoterol  2 puff Inhalation BID  . multivitamin with minerals  1 tablet Oral Daily  . rivaroxaban  20 mg Oral Q supper  . rosuvastatin  10 mg Oral Daily  . sodium chloride flush  3 mL Intravenous Q12H  .  tamsulosin  0.4 mg Oral Daily   Continuous Infusions:  PRN Meds:.sodium chloride, acetaminophen, mupirocin ointment, ondansetron (ZOFRAN) IV, sodium chloride flush   Imaging: Imaging results have been reviewed  Cardiac Studies:  Echo 02/26/16 Study Conclusions  - Left ventricle: Global longitudinal LV strain is calculated at  -11.7% but is inaccurate due to poor tracking. The cavity size  was normal. There was moderate concentric hypertrophy. Systolic  function was normal. The estimated ejection fraction was in the  range of 55% to 60%. Wall motion was normal; there were no  regional wall motion abnormalities. Normal sinus rhythm was  absent. The study is not technically sufficient to allow  evaluation of LV diastolic function. - Aortic valve: Poorly visualized. Trileaflet; normal thickness,  mildly calcified leaflets. There was mild regurgitation. - Mitral valve: Calcified annulus. There was mild regurgitation. - Tricuspid valve: There was mild regurgitation.  Assessment/Plan:  65 y/o AA male with h/o CAD, PVD, paroxysmal atrial flutter/fibrillation with 2 failed ablations, chronic anticoagulation therapy with Xarelto, chronic diastolic HF, HTN, HLD, DM, COPD and ongoing tobacco abuse, admitted for acute on chronic diastolic CHF and atrial firbrillation with RVR. He admits to eating "SPAM sandwiches".   Principal Problem:   Acute on chronic diastolic heart failure (  [place] Active Problems:   Acute respiratory failure (HCC)   Type 2 diabetes mellitus with vascular disease ([place])   HTN (hypertension)   Atrial fibrillation with RVR (HCC)   Chronic anticoagulation-Xarelto (CHADs VASc=5)   Permanent AF- failed RFA   PVD (peripheral vascular disease) with claudication ([place])   CAD- total small RCA Oct 2016   Pulmonary HTN ([place])   PLAN: Diuresing well, HR still poorly controlled. Increase Diltiazem.   Kerin Ransom PA-C 02/27/2016, 9:10 AM [phone]  Patient seen and  examined. Agree with assessment and plan. I/O since admission -[id number].  HR is now  better in the 90s.  Will further titrate cardizem to 60 mg q 8 hrs. BP stable; continue present dose of metoprolol and losartan.   Troy Sine, MD, [facility] 02/27/2016 4:56 PM

## 2016-02-27 NOTE — Care Management Note (Signed)
Case Management Note  Patient Details  Name: Neil PIGEON Sr. MRN: [id number] Date of Birth: August 30, 1951  Subjective/Objective: 65 y/o m admitted w/CHF. From home.                   Action/Plan:d/c plan home.   Expected Discharge Date:   (UNKNOWN)               Expected Discharge Plan:  Home/Self Care  In-House Referral:     Discharge planning Services  CM Consult  Post Acute Care Choice:    Choice offered to:     DME Arranged:    DME Agency:     HH Arranged:    HH Agency:     Status of Service:  In process, will continue to follow  Medicare Important Message Given:    Date Medicare IM Given:    Medicare IM give by:    Date Additional Medicare IM Given:    Additional Medicare Important Message give by:     If discussed at [place] of Stay Meetings, dates discussed:    Additional Comments:  Dessa Phi, RN 02/27/2016, 1:38 PM

## 2016-02-28 LAB — BASIC METABOLIC PANEL
Anion gap: 8 (ref 5–15)
BUN: 15 mg/dL (ref 6–20)
CO2: 27 mmol/L (ref 22–32)
Calcium: 8.3 mg/dL — ABNORMAL LOW (ref 8.9–10.3)
Chloride: 103 mmol/L (ref 101–111)
Creatinine, Ser: 1.16 mg/dL (ref 0.61–1.24)
GFR calc Af Amer: >60 mL/min (ref >60)
GFR calc non Af Amer: >60 mL/min (ref >60)
Glucose, Bld: 140 mg/dL — ABNORMAL HIGH (ref 65–99)
Potassium: 3.8 mmol/L (ref 3.5–5.1)
Sodium: 138 mmol/L (ref 135–145)

## 2016-02-28 LAB — GLUCOSE, CAPILLARY: Glucose-Capillary: 193 mg/dL — ABNORMAL HIGH (ref 65–99)

## 2016-02-28 LAB — MAGNESIUM: MAGNESIUM: 2.4 mg/dL (ref 1.7–2.4)

## 2016-02-28 MED ORDER — DILTIAZEM HCL ER COATED BEADS 180 MG PO CP24
180.0000 mg | ORAL_CAPSULE | Freq: Every day | ORAL | Status: DC
  Administered 2016-02-28 – 2016-02-29 (×2): 180 mg via ORAL
  Filled 2016-02-28 (×2): qty 1

## 2016-02-28 MED ORDER — LOSARTAN POTASSIUM 50 MG PO TABS
75.0000 mg | ORAL_TABLET | Freq: Every day | ORAL | Status: DC
  Administered 2016-02-29: 75 mg via ORAL
  Filled 2016-02-28: qty 2

## 2016-02-28 MED ORDER — LOSARTAN POTASSIUM 50 MG PO TABS: 50.0000 mg | ORAL_TABLET | Freq: Every day | ORAL | Status: DC

## 2016-02-28 MED ORDER — LOSARTAN POTASSIUM 50 MG PO TABS: 75.0000 mg | ORAL_TABLET | Freq: Every day | ORAL | Status: DC

## 2016-02-28 MED ORDER — METOLAZONE 5 MG PO TABS
5.0000 mg | ORAL_TABLET | Freq: Once | ORAL | Status: AC
  Administered 2016-02-28: 5 mg via ORAL
  Filled 2016-02-28: qty 1

## 2016-02-28 MED ORDER — METOLAZONE 5 MG PO TABS
5.0000 mg | ORAL_TABLET | Freq: Once | ORAL | Status: DC
  Filled 2016-02-28: qty 1

## 2016-02-28 NOTE — Progress Notes (Signed)
[facility] Progress Note  Patient: Neil Carroll [id number]:[id number]   PCP: Hoyt Koch, MD DOB: [birth year]   DOA: 02/25/2016   DOS: 02/28/2016   Date of Service: the patient was seen and examined on 02/28/2016  Subjective: Patient is presenting is getting significantly better. Denies any chest pain or shortness of breath. He is also getting better. No diarrhea or constipation. Nutrition: Tolerating oral diet  Brief hospital course: Patient was admitted on 02/25/2016, with complaint of shortness of breath as well as leg swelling, was found to have acute on chronic diastolic dysfunction. Currently further plan is continue IV Lasix.  Assessment and Plan: 1. Acute on chronic diastolic heart failure (HCC) Continue IV Lasix, 40 mg twice a day. As per request from cardiology and will order pharmacy, [facility] today. Appreciate cardiology input Strict ins and outs and daily weight. echocardiogram shows normal ejection fraction with diastolic dysfunction. Likely from poor rate controlled  2. Type 2 diabetes mellitus with peripheral vascular disease.  Sugars are reasonably controlled. We will continue with Levemir 24 units daily at bedtime as well as sliding scale insulin.  3. History of chronic A. Fib. CHA2DS2-VASc Score 5 Currently continue rate control. Patient on metoprolol at home, started on Cardizem here by cardiology and the dose will be increased today due to poor rate controlled. TSH normal. Continue Xarelto  4. Peripheral vascular disease. Continue Pletal.  5. Essential hypertension. Continue losartan. Dose will be reduced to 50 mg 100 mg. Continue Lopressor.  cardiology has started the patient on Cardizem.  6. BPH. Continue Flomax.  7. History of COPD.  Due to bilateral expiratory wheezing continue DuoNeb's. monitor for improvement.  Pain management: When necessary Tylenol Activity: physical therapy recommends no PT follow-up Bowel regimen: last BM  02/27/2016 Diet: Cardiac and carb modified diet DVT Prophylaxis: on therapeutic anticoagulation.  Advance goals of care discussion: Full code  Family Communication: no family was present at bedside, at the time of interview.   Disposition:  Discharge to home, likely home health Expected discharge date: 02/29/2016  Consultants: Cardiology Procedures: Echocardiogram   Antibiotics: Anti-infectives    None        Intake/Output Summary (Last 24 hours) at 02/28/16 1654 Last data filed at 02/28/16 1350  Gross per 24 hour  Intake    960 ml  Output   1875 ml  Net   -915 ml   Filed Weights   02/26/16 1517 02/27/16 0651 02/28/16 0500  Weight: 119.069 kg (262 lb 8 oz) 118.343 kg (260 lb 14.4 oz) 118.3 kg (260 lb 12.9 oz)    Objective: Physical Exam: Filed Vitals:   02/28/16 0646 02/28/16 0817 02/28/16 0932 02/28/16 1348  BP:   116/82 98/34  Pulse: 88  91 93  Temp:    98.9 F (37.2 C)  TempSrc:    Oral  Resp:    18  Height:      Weight:      SpO2:  92%  95%    General: Alert, Awake and Oriented to Time, Place and Person. Appear in moderate distress Eyes: PERRL, Conjunctiva normal ENT: Oral Mucosa clear moist. Neck: difficult to assess JVD, no Abnormal Mass Or lumps Cardiovascular: S1 and S2 Present, aortic systolic Murmur, Peripheral Pulses Present Respiratory: Bilateral Air entry equal and Decreased, bilateral Crackles, No wheezes Abdomen: Bowel Sound present, Soft and no tenderness Skin: no redness, no Rash  Extremities: bilateral Pedal edema, no calf tenderness Neurologic: Grossly no focal neuro deficit. Bilaterally Equal motor  strength   Data Reviewed: CBC:  Recent Labs Lab 02/25/16 1227 02/27/16 0417  WBC 7.6 8.1  HGB 14.8 14.6  HCT 45.0 43.8  MCV 91.5 89.0  PLT 157 836   Basic Metabolic Panel:  Recent Labs Lab 02/25/16 1227 02/26/16 0522 02/27/16 0417 02/28/16 0442  NA 141 139 139 138  K 3.8 3.8 3.9 3.8  CL 112* 104 102 103  CO2 '22 27 28  27  '$ GLUCOSE 110* 129* 135* 140*  BUN '12 11 14 15  '$ CREATININE 1.18 1.22 1.18 1.16  CALCIUM 8.8* 8.6* 8.6* 8.3*  MG  --   --  2.2 2.4   CBG:  Recent Labs Lab 02/25/16 1659 02/25/16 2150 02/27/16 2249  GLUCAP 107* 95 131*    Studies: No results found.   Scheduled Meds: . cilostazol  100 mg Oral BID  . collagenase   Topical Daily  . diltiazem  180 mg Oral Daily  . etanercept (ENBREL) SQ  50 mg Subcutaneous Once per day on Mon Thu  . furosemide  40 mg Intravenous BID  . insulin detemir  24 Units Subcutaneous QHS  . ipratropium  0.5 mg Nebulization BID  . levalbuterol  0.63 mg Nebulization BID  . [START ON 02/29/2016] losartan  75 mg Oral Daily  . metolazone  5 mg Oral Once  . metoprolol  100 mg Oral BID  . mometasone-formoterol  2 puff Inhalation BID  . multivitamin with minerals  1 tablet Oral Daily  . rivaroxaban  20 mg Oral Q supper  . rosuvastatin  10 mg Oral Daily  . sodium chloride flush  3 mL Intravenous Q12H  . tamsulosin  0.4 mg Oral Daily   Continuous Infusions:  PRN Meds: sodium chloride, acetaminophen, mupirocin ointment, ondansetron (ZOFRAN) IV, sodium chloride flush  Time spent: 30 minutes  Author: Berle Mull, MD [facility] Pager: [phone] 02/28/2016 4:54 PM  If 7PM-7AM, please contact night-coverage at [email or web address], password [facility]

## 2016-02-28 NOTE — Progress Notes (Signed)
Subjective:  SOB improving  Objective:  Vital Signs in the last 24 hours: Temp:  [98.2 F (36.8 C)-99 F (37.2 C)] 98.4 F (36.9 C) (06/09 0451) Pulse Rate:  [60-101] 88 (06/09 0646) Resp:  [20-22] 20 (06/09 0451) BP: (100-148)/(61-84) 117/82 mmHg (06/09 0641) SpO2:  [92 %-96 %] 92 % (06/09 0817) Weight:  [260 lb 12.9 oz (118.3 kg)] 260 lb 12.9 oz (118.3 kg) (06/09 0500)  Intake/Output from previous day:  Intake/Output Summary (Last 24 hours) at 02/28/16 0855 Last data filed at 02/28/16 0700  Gross per 24 hour  Intake   1200 ml  Output   1500 ml  Net   -300 ml    Physical Exam: General appearance: alert, cooperative, no distress, moderately obese and on O2 Lungs: decreased BS, no rhonchi Heart: regular rate and rhythm Extremities: chronic venous insufficiency with ulcers- dressings on both LE, edema appears improved c/w yesterday Neurologic: Grossly normal   Rate: 80-100  Rhythm: atrial fibrillation  Lab Results:  Recent Labs  02/25/16 1227 02/27/16 0417  WBC 7.6 8.1  HGB 14.8 14.6  PLT 157 157    Recent Labs  02/27/16 0417 02/28/16 0442  NA 139 138  K 3.9 3.8  CL 102 103  CO2 28 27  GLUCOSE 135* 140*  BUN 14 15  CREATININE 1.18 1.16   No results for input(s): TROPONINI in the last 72 hours.  Invalid input(s): CK, MB No results for input(s): INR in the last 72 hours.   BNP (last 3 results)  Recent Labs  11/10/15 0920 02/25/16 1249  BNP 52.1 226.9*    ProBNP (last 3 results) No results for input(s): PROBNP in the last 8760 hours.   Scheduled Meds: . cilostazol  100 mg Oral BID  . collagenase   Topical Daily  . diltiazem  60 mg Oral Q8H  . etanercept (ENBREL) SQ  50 mg Subcutaneous Once per day on Mon Thu  . furosemide  40 mg Intravenous BID  . insulin detemir  24 Units Subcutaneous QHS  . ipratropium  0.5 mg Nebulization BID  . levalbuterol  0.63 mg Nebulization BID  . losartan  100 mg Oral Daily  . metoprolol  100 mg Oral BID   . mometasone-formoterol  2 puff Inhalation BID  . multivitamin with minerals  1 tablet Oral Daily  . rivaroxaban  20 mg Oral Q supper  . rosuvastatin  10 mg Oral Daily  . sodium chloride flush  3 mL Intravenous Q12H  . tamsulosin  0.4 mg Oral Daily   Continuous Infusions:  PRN Meds:.sodium chloride, acetaminophen, mupirocin ointment, ondansetron (ZOFRAN) IV, sodium chloride flush   Imaging: Imaging results have been reviewed  Cardiac Studies:  Echo 02/26/16 Study Conclusions  - Left ventricle: Global longitudinal LV strain is calculated at  -11.7% but is inaccurate due to poor tracking. The cavity size  was normal. There was moderate concentric hypertrophy. Systolic  function was normal. The estimated ejection fraction was in the  range of 55% to 60%. Wall motion was normal; there were no  regional wall motion abnormalities. Normal sinus rhythm was  absent. The study is not technically sufficient to allow  evaluation of LV diastolic function. - Aortic valve: Poorly visualized. Trileaflet; normal thickness,  mildly calcified leaflets. There was mild regurgitation. - Mitral valve: Calcified annulus. There was mild regurgitation. - Tricuspid valve: There was mild regurgitation.  Assessment/Plan:  65 y/o AA male with h/o CAD, PVD, paroxysmal atrial flutter/fibrillation with 2 failed  ablations, chronic anticoagulation therapy with Xarelto, chronic diastolic HF, HTN, HLD, DM, COPD and ongoing tobacco abuse, admitted for acute on chronic diastolic CHF and atrial firbrillation with RVR. He admits to eating "SPAM sandwiches".   Principal Problem:   Acute on chronic diastolic heart failure (HCC) Active Problems:   Acute respiratory failure (HCC)   Type 2 diabetes mellitus with vascular disease (HCC)   HTN (hypertension)   Atrial fibrillation with RVR (HCC)   Chronic anticoagulation-Xarelto (CHADs VASc=5)   Permanent AF- failed RFA   PVD (peripheral vascular disease) with  claudication (HCC)   CAD- total small RCA Oct 2016   Pulmonary HTN ([place])   PLAN: Diuresis has tapered off-wgt unchanged from yesterday.   Consider a dose or two of metolazone- will discuss with MD.   Change Diltiazem to 180 mg po daily. B/P trending down, we may have to decrease Cozaar to 50 mg at discharge.   Kerin Ransom PA-C 02/28/2016, 8:55 AM [phone]   Patient seen and examined. Agree with assessment and plan. I/O -4710 since admission.  Wt 266 -> 260, although pt says at home wt had increased to 278 at admission.  With BP on low side, will decrease losartan to 75 mg daily.   Troy Sine, MD, [facility] 02/28/2016 4:06 PM

## 2016-02-28 NOTE — Care Management Important Message (Signed)
Important Message  Patient Details  Name: Neil SHELTON Sr. MRN: [id number] Date of Birth: 1951-07-06   Medicare Important Message Given:  Yes    Camillo Flaming 02/28/2016, 10:35 AMImportant Message  Patient Details  Name: Neil STEEN Sr. MRN: [id number] Date of Birth: 1951-07-09   Medicare Important Message Given:  Yes    Camillo Flaming 02/28/2016, 10:34 AM

## 2016-02-28 NOTE — Evaluation (Signed)
Physical Therapy Evaluation-1x Patient Details Name: Neil RACZKOWSKI Sr. MRN: [id number] DOB: 10/31/1950 Today's Date: 02/28/2016   History of Present Illness  65 yo male admitted with acute on chronic heart failure. Hx of HTN, A flutter, COPD, CHF, DM, PVD, obesity, pulm HTN  Clinical Impression  On eval, pt was Ind with all mobility. No LOB during session. O2 sats dropped to 86% on RA-made RN aware. Nursing may need to reassess O2 saturaton during ambulation again on tomorrow prior to d/c. Will sign off.     Follow Up Recommendations No PT follow up    Equipment Recommendations  None recommended by PT    Recommendations for Other Services       Precautions / Restrictions Precautions Precaution Comments: monitor O2 sats Restrictions Weight Bearing Restrictions: No      Mobility  Bed Mobility               General bed mobility comments: sitting EOB  Transfers Overall transfer level: Modified independent                  Ambulation/Gait Ambulation/Gait assistance: Modified independent (Device/Increase time) Ambulation Distance (Feet): 1000 Feet Assistive device: None Gait Pattern/deviations: WFL(Within Functional Limits)     General Gait Details: O2 sats down as low as 86% on RA. VCs for pursed lip breathing-sats up to 90% but return to mid 80s once ambulation resumes  Stairs            Wheelchair Mobility    Modified Rankin (Stroke Patients Only)       Balance Overall balance assessment: Independent                                           Pertinent Vitals/Pain Pain Assessment: No/denies pain    Home Living Family/patient expects to be discharged to:: Private residence Living Arrangements: Alone             Home Equipment: None      Prior Function Level of Independence: Independent               Hand Dominance        Extremity/Trunk Assessment   Upper Extremity Assessment: Overall WFL for  tasks assessed           Lower Extremity Assessment: Overall WFL for tasks assessed      Cervical / Trunk Assessment: Normal  Communication   Communication: No difficulties  Cognition Arousal/Alertness: Awake/alert Behavior During Therapy: WFL for tasks assessed/performed Overall Cognitive Status: Within Functional Limits for tasks assessed                      General Comments      Exercises        Assessment/Plan    PT Assessment Patent does not need any further PT services  PT Diagnosis     PT Problem List    PT Treatment Interventions     PT Goals (Current goals can be found in the Care Plan section) Acute Rehab PT Goals Patient Stated Goal: home PT Goal Formulation: All assessment and education complete, DC therapy    Frequency     Barriers to discharge        Co-evaluation               End of Session   Activity Tolerance: Patient tolerated treatment  well Patient left: in bed;with call bell/phone within reach           Time: 1401-1418 PT Time Calculation (min) (ACUTE ONLY): 17 min   Charges:   PT Evaluation $PT Eval Low Complexity: 1 Procedure     PT G Codes:        Weston Anna, MPT Pager: [phone]

## 2016-02-29 DIAGNOSIS — I10 Essential (primary) hypertension: Secondary | ICD-10-CM

## 2016-02-29 LAB — BASIC METABOLIC PANEL
Anion gap: 7 (ref 5–15)
BUN: 17 mg/dL (ref 6–20)
CO2: 29 mmol/L (ref 22–32)
Calcium: 8.6 mg/dL — ABNORMAL LOW (ref 8.9–10.3)
Chloride: 101 mmol/L (ref 101–111)
Creatinine, Ser: 1.24 mg/dL (ref 0.61–1.24)
GFR calc Af Amer: >60 mL/min (ref >60)
GFR calc non Af Amer: 59 mL/min — ABNORMAL LOW (ref >60)
Glucose, Bld: 150 mg/dL — ABNORMAL HIGH (ref 65–99)
Potassium: 4 mmol/L (ref 3.5–5.1)
Sodium: 137 mmol/L (ref 135–145)

## 2016-02-29 LAB — GLUCOSE, CAPILLARY: Glucose-Capillary: 125 mg/dL — ABNORMAL HIGH (ref 65–99)

## 2016-02-29 MED ORDER — DILTIAZEM HCL ER COATED BEADS 180 MG PO CP24: 180.0000 mg | ORAL_CAPSULE | Freq: Every day | ORAL | Status: DC

## 2016-02-29 MED ORDER — FUROSEMIDE 40 MG PO TABS: 40.0000 mg | ORAL_TABLET | Freq: Every day | ORAL | Status: DC

## 2016-02-29 MED ORDER — COLLAGENASE 250 UNIT/GM EX OINT
TOPICAL_OINTMENT | Freq: Every day | CUTANEOUS | Status: DC
Start: 2016-02-29 — End: 2016-04-28

## 2016-02-29 MED ORDER — INSULIN ASPART 100 UNIT/ML ~~LOC~~ SOLN
0.0000 [IU] | Freq: Three times a day (TID) | SUBCUTANEOUS | Status: DC
  Administered 2016-02-29: 1 [IU] via SUBCUTANEOUS

## 2016-02-29 MED ORDER — LOSARTAN POTASSIUM 25 MG PO TABS: 75.0000 mg | ORAL_TABLET | Freq: Every day | ORAL | Status: DC

## 2016-02-29 MED ORDER — INSULIN ASPART 100 UNIT/ML ~~LOC~~ SOLN: 0.0000 [IU] | Freq: Every day | SUBCUTANEOUS | Status: DC

## 2016-02-29 NOTE — Progress Notes (Signed)
Completed D/C teaching. Answered all questions. Pt will be D/C home with family in stable condition. 

## 2016-02-29 NOTE — Progress Notes (Signed)
Primary cardiologist: Dr. Burt Knack and Dr. Curt Bears  Seen for followup: Acute on chronic diastolic heart failure  Subjective:    Feels better, has been any living in the hall. No chest pain or palpitations. Would like to go home.  Objective:   Temp:  [98.2 F (36.8 C)-98.9 F (37.2 C)] 98.2 F (36.8 C) (06/10 0514) Pulse Rate:  [50-97] 97 (06/10 0822) Resp:  [18-20] 20 (06/10 0514) BP: (98-141)/(34-95) 112/66 mmHg (06/10 0822) SpO2:  [87 %-98 %] 95 % (06/10 0802) Weight:  [258 lb 3.2 oz (117.119 kg)] 258 lb 3.2 oz (117.119 kg) (06/10 0514) Last BM Date: 02/28/16  Filed Weights   02/27/16 0651 02/28/16 0500 02/29/16 0514  Weight: 260 lb 14.4 oz (118.343 kg) 260 lb 12.9 oz (118.3 kg) 258 lb 3.2 oz (117.119 kg)    Intake/Output Summary (Last 24 hours) at 02/29/16 0917 Last data filed at 02/29/16 0858  Gross per 24 hour  Intake    960 ml  Output   1825 ml  Net   -865 ml    Telemetry: Atrial fibrillation.  Exam:  General: Overweight male, no distress.  Lungs: Decreased breath sounds, no crackles.  Cardiac: Irregularly irregular.  Abdomen: NABS.  Extremities: Improved leg edema.  Lab Results:  Basic Metabolic Panel:  Recent Labs Lab 02/27/16 0417 02/28/16 0442 02/29/16 0502  NA 139 138 137  K 3.9 3.8 4.0  CL 102 103 101  CO2 '28 27 29  '$ GLUCOSE 135* 140* 150*  BUN '14 15 17  '$ CREATININE 1.18 1.16 1.24  CALCIUM 8.6* 8.3* 8.6*  MG 2.2 2.4  --     CBC:  Recent Labs Lab 02/25/16 1227 02/27/16 0417  WBC 7.6 8.1  HGB 14.8 14.6  HCT 45.0 43.8  MCV 91.5 89.0  PLT 157 157   Echocardiogram 02/26/2016: Study Conclusions  - Left ventricle: Global longitudinal LV strain is calculated at  -11.7% but is inaccurate due to poor tracking. The cavity size  was normal. There was moderate concentric hypertrophy. Systolic  function was normal. The estimated ejection fraction was in the  range of 55% to 60%. Wall motion was normal; there were no  regional  wall motion abnormalities. Normal sinus rhythm was  absent. The study is not technically sufficient to allow  evaluation of LV diastolic function. - Aortic valve: Poorly visualized. Trileaflet; normal thickness,  mildly calcified leaflets. There was mild regurgitation. - Mitral valve: Calcified annulus. There was mild regurgitation. - Tricuspid valve: There was mild regurgitation.   Medications:   Scheduled Medications: . cilostazol  100 mg Oral BID  . collagenase   Topical Daily  . diltiazem  180 mg Oral Daily  . etanercept (ENBREL) SQ  50 mg Subcutaneous Once per day on Mon Thu  . furosemide  40 mg Intravenous BID  . insulin aspart  0-5 Units Subcutaneous QHS  . insulin aspart  0-9 Units Subcutaneous TID WC  . insulin detemir  24 Units Subcutaneous QHS  . ipratropium  0.5 mg Nebulization BID  . levalbuterol  0.63 mg Nebulization BID  . losartan  75 mg Oral Daily  . metoprolol  100 mg Oral BID  . mometasone-formoterol  2 puff Inhalation BID  . multivitamin with minerals  1 tablet Oral Daily  . rivaroxaban  20 mg Oral Q supper  . rosuvastatin  10 mg Oral Daily  . sodium chloride flush  3 mL Intravenous Q12H  . tamsulosin  0.4 mg Oral Daily  PRN Medications:  sodium chloride, acetaminophen, mupirocin ointment, ondansetron (ZOFRAN) IV, sodium chloride flush   Assessment:   1. Acute on chronic diastolic heart failure with volume overload, clinically improved. LVEF 55-60% by follow-up echocardiogram. Weight down about 8 pounds, he is diuresed approximately 5500 cc. Patient admits to slow weight gain as an outpatient and dietary indiscretion.  2. Chronic atrial fibrillation status post previous failed ablation attempts. He continues with strategy of heart rate control and anticoagulation.  3. COPD with tobacco abuse.  4. CAD with severely diseased distal circumflex and total occlusion of a small codominant RCA. This has been managed medically. No active angina symptoms  or obvious ACS.   Plan/Discussion:    Patient would like to go home today. Would recommend continuing his baseline cardiac regimen. Does not need metolazone long-term. We discussed following weight more carefully, if he goes up 2-3 pounds within 24 hours he should take an additional Lasix 40 mg dose, otherwise would continue on the Lasix 40 mg daily for now. He will need a follow-up visit in the next one to 2 weeks with Dr. Burt Knack or APP. If weight gain remains a recurring problem due to volume overload, standing Lasix dose could be increased further, or he could use metolazone when necessary.   Satira Sark, M.D., F.A.C.C.

## 2016-03-02 ENCOUNTER — Telehealth: Payer: Self-pay | Admitting: *Deleted

## 2016-03-02 NOTE — Telephone Encounter (Signed)
Transition Care Management Follow-up Telephone Call   Date discharged? 02/29/16   How have you been since you were released from the hospital? Pt states he is feeling alright   Do you understand why you were in the hospital? YES   Do you understand the discharge instructions? YES  Where were you discharged to? Home  Items Reviewed:  Medications reviewed: YES  Allergies reviewed: YES  Dietary changes reviewed: NO  Referrals reviewed: YES   Functional Questionnaire:   Activities of Daily Living (ADLs):   He states he are independent in the following: ambulation, bathing and hygiene, feeding, continence, grooming, toileting and dressing States heyrequire assistance with the following: ambulation   Any transportation issues/concerns?: NO   Any patient concerns? NO   Confirmed importance and date/time of follow-up visits scheduled YES, appt 03/09/16  Provider Appointment booked with Dr. Sharlet Salina  Confirmed with patient if condition begins to worsen call PCP or go to the ER.  Patient was given the office number and encouraged to call back with question or concerns.  : YES

## 2016-03-02 NOTE — Discharge Summary (Signed)
[facility] Discharge Summary   Patient: Neil Carroll VEH:[id number]   PCP: Hoyt Koch, MD DOB: 1951/01/23   Date of admission: 02/25/2016   Date of discharge: 02/29/2016     Discharge Diagnoses:  Principal Problem:   Acute on chronic diastolic heart failure ([place]) Active Problems:   Type 2 diabetes mellitus with vascular disease ([place])   HTN (hypertension)   PVD (peripheral vascular disease) with claudication ([place])   CAD- total small RCA Oct 2016   Pulmonary HTN ([place])   Acute respiratory failure ([facility])   Atrial fibrillation with RVR ([facility])   Chronic anticoagulation-Xarelto ([facility] VASc=5)   Permanent AF- failed RFA  Admitted From: Home Disposition:  Home  Recommendations for Outpatient Follow-up:  1. Please follow-up with cardiology as well as PCP as recommended   Follow-up Information    Follow up with Hoyt Koch, MD. Schedule an appointment as soon as possible for a visit in 1 week.   Specialty:  Internal Medicine   Contact information:   [place] [phone]       Follow up with Sherren Mocha, MD. Schedule an appointment as soon as possible for a visit in 1 week.   Specialty:  Cardiology   Contact information:   [address] [address] [phone]       Schedule an appointment as soon as possible for a visit in 1 week to follow up.   Why:  PODIATRY FOR WOUND CARE.     Diet recommendation: Cardiac diet  Activity: The patient is advised to gradually reintroduce usual activities.  Discharge Condition: good  Code Status: Full code  History of present illness: As per the H and P dictated on admission, "65 year old male with history of chronic atrial fibrillation, COPD, obesity, PVD, HL, pulmonary hypertension and HTN. Patient may also have likely undiagnosed diastolic dysfunction. ECHO done on 01/2016 revealed normal EF, but diastolic function could not be assessed due  to atrial fibrillation. Patient is a poor historian. Patient was seen alongside significant other. Patient presents with 2-4 weeks of progressive shortness of breath, dyspnea on exertion, orthopnea and bilateral leg edema. CXR done on presentation revealed pulmonary edema. BNP is elevated. No chest pain, no headache, no neck pain, no fever or chills, no GI symptoms and no urinary symptoms. On presentation, patient was in atrial fibrillation with rapid ventricular response. Patient will be admitted for further assessment and management."  Hospital Course:  Summary of his active problems in the hospital is as following. 1. Acute on chronic diastolic heart failure ([place]) Patient was given IV Lasix, 40 mg twice a day, Zaroxolyn 1. Appreciate cardiology input echocardiogram shows normal ejection fraction with diastolic dysfunction. Likely from poor rate controlled. Per cardiology discharge Lasix is reduced to 40 mg daily with 40 mg when necessary for extra weight gain.  2. Type 2 diabetes mellitus with peripheral vascular disease. Sugars are reasonably controlled. We will continue with Levemir home dose  3. History of chronic A. Fib. CHA2DS2-VASc Score 5 Currently continue rate control. Patient on metoprolol at home, started on Cardizem here by cardiology. TSH normal. Continue Xarelto  4. Peripheral vascular disease. Continue Pletal.  5. Essential hypertension. Continue losartan. Dose will be reduced to 75 mg 100 mg. Continue Lopressor.  cardiology has started the patient on Cardizem.  6. BPH. Continue Flomax.  7. History of COPD.  Due to bilateral expiratory wheezing continue DuoNeb's. monitor for improvement.  All other chronic medical condition were stable during the hospitalization.  Patient was seen by physical therapy, who recommended no therapy follow-up required. On the day of the discharge the patient's vitals were stable, and no other acute medical condition were  reported by patient. the patient was felt safe to be discharge at home with family.  Procedures and Results:  Echocardiogram limited   Consultations:  Cardiology  DISCHARGE MEDICATION: Discharge Medication List as of 02/29/2016 12:47 PM    START taking these medications   Details  collagenase (SANTYL) ointment Apply topically daily., Starting 02/29/2016, Until Discontinued, Normal    diltiazem (CARDIZEM CD) 180 MG 24 hr capsule Take 1 capsule (180 mg total) by mouth daily., Starting 02/29/2016, Until Discontinued, Normal      CONTINUE these medications which have CHANGED   Details  furosemide (LASIX) 40 MG tablet Take 1 tablet (40 mg total) by mouth daily. TAKE EXTRA 40 MG IF WEIGHT IS MORE THAN 2 LBS UP IN 24 HOURS, Starting 02/29/2016, Until Discontinued, Normal    losartan (COZAAR) 25 MG tablet Take 3 tablets (75 mg total) by mouth daily., Starting 02/29/2016, Until Discontinued, Normal      CONTINUE these medications which have NOT CHANGED   Details  acetaminophen (TYLENOL) 650 MG CR tablet Take 650 mg by mouth every 8 (eight) hours as needed for pain., Until Discontinued, Historical Med    cilostazol (PLETAL) 100 MG tablet Take 1 tablet (100 mg total) by mouth 2 (two) times daily., Starting 10/25/2015, Until Discontinued, Normal    clobetasol (OLUX) 0.05 % topical foam Apply topically 2 (two) times daily. , Starting 12/27/2014, Until Discontinued, Historical Med    etanercept (ENBREL SURECLICK) 50 MG/ML injection Inject 50 mg into the skin 2 (two) times a week. Inject on Sundays & Wednesdays, Until Discontinued, Historical Med    Flaxseed, Linseed, (FLAX SEED OIL) 1000 MG CAPS Take 1 capsule by mouth 2 (two) times daily. , Until Discontinued, Historical Med    Fluticasone-Salmeterol (ADVAIR) 100-50 MCG/DOSE AEPB Inhale 1 puff into the lungs 2 (two) times daily., Until Discontinued, Historical Med    Insulin Detemir (LEVEMIR FLEXTOUCH) 100 UNIT/ML Pen INJECT 24 UNITS INTO THE    SKIN DAILY, Normal    metformin (FORTAMET) 1000 MG (OSM) 24 hr tablet TAKE 1 TABLET TWICE A DAY, Normal    metoprolol (LOPRESSOR) 100 MG tablet take 1 tablet by mouth twice a day, Normal    Multiple Vitamins-Minerals (CENTRUM ADULTS) TABS Take 1 tablet by mouth daily., Until Discontinued, Historical Med    Omega-3 Fatty Acids (FISH OIL) 1000 MG CAPS Take 2 capsules by mouth 2 (two) times daily. , Until Discontinued, Historical Med    rivaroxaban (XARELTO) 20 MG TABS tablet Take 1 tablet (20 mg total) by mouth daily with supper., Starting 01/03/2016, Until Discontinued, Normal    rosuvastatin (CRESTOR) 10 MG tablet Take 1 tablet (10 mg total) by mouth daily., Starting 10/28/2015, Until Discontinued, Normal    tamsulosin (FLOMAX) 0.4 MG CAPS capsule Take 0.4 mg by mouth daily. , Until Discontinued, Historical Med    VICTOZA 18 MG/3ML SOPN INJECT 0.3ML (=1.'8MG'$ )      SUBCUTANEOUSLY DAILY, Normal    Insulin Pen Needle (NOVOFINE) 32G X 6 MM MISC Use 2 needles daily with Levimir pen and Victoza pen  DX: E11.69, Normal    mupirocin ointment (BACTROBAN) 2 % Apply 1 application topically 2 (two) times daily as needed (affected areas). , Until Discontinued, Historical Med    ONE TOUCH  ULTRA TEST test strip TEST once daily, Normal    triamcinolone cream (KENALOG) 0.1 % Apply 1 application topically 2 (two) times daily as needed (affected area). , Until Discontinued, Historical Med       Allergies  Allergen Reactions  . Niacin Nausea Only    REACTION: upset stomach   Discharge Instructions    Avoid straining    Complete by:  As directed      Diet - low sodium heart healthy    Complete by:  As directed      Diet Carb Modified    Complete by:  As directed      Discharge wound care:    Complete by:  As directed   Cleanse bilateral lower legs with soap and water. Santyl ointment to open lesions on left and right lower leg. Cover with 4x4 gauze, kerlix and tape.     Heart Failure patients  record your daily weight using the same scale at the same time of day    Complete by:  As directed      Increase activity slowly    Complete by:  As directed           Discharge Exam: Filed Weights   02/27/16 0651 02/28/16 0500 02/29/16 0514  Weight: 118.343 kg (260 lb 14.4 oz) 118.3 kg (260 lb 12.9 oz) 117.119 kg (258 lb 3.2 oz)   Filed Vitals:   02/29/16 0514 02/29/16 0822  BP: 131/71 112/66  Pulse: 50 97  Temp: 98.2 F (36.8 C)   Resp: 20    General: Appear in no distress, no Rash; Oral Mucosa moist. Cardiovascular: S1 and S2 Present, no Murmur, no JVD Respiratory: Bilateral Air entry present and Clear to Auscultation, no Crackles, no wheezes Abdomen: Bowel Sound present, Soft and no tenderness Extremities: bilateral Pedal edema, no calf tenderness Neurology: Grossly no focal neuro deficit.  The results of significant diagnostics from this hospitalization (including imaging, microbiology, ancillary and laboratory) are listed below for reference.    Significant Diagnostic Studies: Dg Chest 2 View  02/25/2016  CLINICAL DATA:  Shortness of breath and lower extremity swelling EXAM: CHEST  2 VIEW COMPARISON:  November 14, 2015 FINDINGS: The heart size and mediastinal contours are stable. There is pulmonary edema. There is no focal pneumonia pneumonia or pleural effusion. There is scarring of the lateral right upper lobe unchanged. The visualized skeletal structures are stable. IMPRESSION: Pulmonary edema. Electronically Signed   By: Abelardo Diesel M.D.   On: 02/25/2016 14:01    Microbiology: No results found for this or any previous visit (from the past 240 hour(s)).   Labs: CBC:  Recent Labs Lab 02/25/16 1227 02/27/16 0417  WBC 7.6 8.1  HGB 14.8 14.6  HCT 45.0 43.8  MCV 91.5 89.0  PLT 157 572   Basic Metabolic Panel:  Recent Labs Lab 02/25/16 1227 02/26/16 0522 02/27/16 0417 02/28/16 0442 02/29/16 0502  NA 141 139 139 138 137  K 3.8 3.8 3.9 3.8 4.0  CL 112*  104 102 103 101  CO2 '22 27 28 27 29  '$ GLUCOSE 110* 129* 135* 140* 150*  BUN '12 11 14 15 17  '$ CREATININE 1.18 1.22 1.18 1.16 1.24  CALCIUM 8.8* 8.6* 8.6* 8.3* 8.6*  MG  --   --  2.2 2.4  --    BNP (last 3 results)  Recent Labs  11/10/15 0920 02/25/16 1249  BNP 52.1 226.9*   CBG:  Recent Labs Lab 02/25/16 1659 02/25/16 2150 02/27/16 2249  02/28/16 2318 02/29/16 1128  GLUCAP 107* 95 131* 193* 125*   Time spent: 30 minutes  Signed:  Burnham Trost  [facility] 02/29/2016 , 6:59 AM

## 2016-03-03 DIAGNOSIS — R600 Localized edema: Secondary | ICD-10-CM | POA: Diagnosis not present

## 2016-03-03 DIAGNOSIS — I83028 Varicose veins of left lower extremity with ulcer other part of lower leg: Secondary | ICD-10-CM | POA: Diagnosis not present

## 2016-03-03 DIAGNOSIS — I83018 Varicose veins of right lower extremity with ulcer other part of lower leg: Secondary | ICD-10-CM | POA: Diagnosis not present

## 2016-03-03 DIAGNOSIS — L03115 Cellulitis of right lower limb: Secondary | ICD-10-CM | POA: Diagnosis not present

## 2016-03-03 DIAGNOSIS — L03116 Cellulitis of left lower limb: Secondary | ICD-10-CM | POA: Diagnosis not present

## 2016-03-03 NOTE — Progress Notes (Signed)
Cardiology Office Note   Date:  03/04/2016   ID:  Neil Housekeeper Sr., DOB 1950/10/06, MRN [id number]  PCP:  Neil Koch, MD  Cardiologist:  Dr. Burt Carroll    Chief Complaint  Patient presents with  . Leg Swelling    no SOB      History of Present Illness: Neil Carroll. is a 65 y.o. male who presents for post hospital for acute on chronic diastolic HF.     He has a hx. Of  CAD, PVD, paroxysmal atrial flutter/fibrillation with 2 failed ablations, chronic anticoagulation therapy with Xarelto, chronic diastolic HF, HTN, HLD, DM, COPD and ongoing tobacco abuse, admitted for acute on chronic diastolic CHF and atrial firbrillation with RVR. Xarelto for a/c given CHA2DS2 VASc score of 5 (CHF, HTN, DM, Age [age] and Vascular disease).  Scheduled for sleep study 04/03/16.  Wt on admit was 278 lbs. At discharge 258 LBS  After diuresing 5500 cc He was instructed at discharge that if wt. goes up 2-3 pounds within 24 hours he should take an additional Lasix 40 mg dose, otherwise would continue on the Lasix 40 mg daily for now- currently he has no scales.    Leg wounds are followed and legs are wrapped.  Still with edema of lower ext.  Is now on antibiotic for his leg ulcer.     Today he has no chest pain and no significant SOB.  Wt is up but he has on shoes and clothes from the hospital.   He tries to monitor his salt intake but does eat fast foods.     Past Medical History  Diagnosis Date  . Other peripheral vascular disease(443.89)     bilateral lower extremity  . Hyperlipidemia   . Hypertension   . Transient global amnesia   . Contact dermatitis and other eczema due to plants (except food)   . Tobacco use disorder     dependent  . Atrial flutter ([place])     onset  2011. s/p EPS/RFA 12/2011  . Type II diabetes mellitus ([place])   . Chronic bronchitis   . GERD (gastroesophageal reflux disease)   . LV dysfunction     EF 45-50% 12/2011  . Adenomatous colon polyp   .  COPD (chronic obstructive pulmonary disease) ([place])   . Arthritis     left hip replacement  . Cataract   . Tuberculosis     Past Surgical History  Procedure Laterality Date  . Partial hip arthroplasty Right 01/2000    "Right; replaced ball & stem"  . Partial hip arthroplasty Left   . Cystoscopy  11/2007    Dr Neil Carroll  . Cardiac electrophysiology mapping and ablation  03/2010  . Tee without cardioversion  12/28/2011    Procedure: TRANSESOPHAGEAL ECHOCARDIOGRAM (TEE);  Surgeon: Neil Dresser, MD;  Location: [facility];  Service: Cardiovascular;  Laterality: N/A;  . Colonoscopy with polypectomy  2006 & 2011    Dr Neil Carroll  . A flutter ablation N/A 12/28/2011    Procedure: ABLATION A FLUTTER;  Surgeon: Neil Lance, MD;  Location: [facility] CATH LAB;  Service: Cardiovascular;  Laterality: N/A;  . Colonoscopy    . Polypectomy    . Cardiac catheterization N/A 07/10/2015    Procedure: Left Heart Cath and Coronary Angiography;  Surgeon: Neil Mocha, MD;  Location: [facility] CV LAB;  Service: Cardiovascular;  Laterality: N/A;     Current Outpatient Prescriptions  Medication Sig Dispense Refill  . acetaminophen (TYLENOL)  650 MG CR tablet Take 650 mg by mouth every 8 (eight) hours as needed for pain.    . cilostazol (PLETAL) 100 MG tablet Take 1 tablet (100 mg total) by mouth 2 (two) times daily. 180 tablet 1  . clobetasol (OLUX) 0.05 % topical foam Apply topically 2 (two) times daily.   0  . collagenase (SANTYL) ointment Apply topically daily. 15 g 0  . diltiazem (CARDIZEM CD) 180 MG 24 hr capsule Take 1 capsule (180 mg total) by mouth daily. 30 capsule 0  . doxycycline (VIBRAMYCIN) 100 MG capsule Take 1 capsule by mouth 2 (two) times daily.  0  . etanercept (ENBREL SURECLICK) 50 MG/ML injection Inject 50 mg into the skin 2 (two) times a week. Inject on Sundays & Wednesdays    . Flaxseed, Linseed, (FLAX SEED OIL) 1000 MG CAPS Take 1 capsule by mouth 2 (two) times daily.     .  Fluticasone-Salmeterol (ADVAIR) 100-50 MCG/DOSE AEPB Inhale 1 puff into the lungs 2 (two) times daily.    . furosemide (LASIX) 40 MG tablet TAKE 2 TABLETS IN THE MORNING FOR 5 DAYS THEN TAKE 1 TABLET BY MOUTH DAILY AND TAKE EXTRA 40 MG IF WEIGHT IS MORE THAN 2 LBS UP IN 24 HOURS 90 tablet 0  . Insulin Detemir (LEVEMIR FLEXTOUCH) 100 UNIT/ML Pen INJECT 24 UNITS INTO THE   SKIN DAILY 30 mL 1  . losartan (COZAAR) 25 MG tablet Take 3 tablets (75 mg total) by mouth daily. 30 tablet 0  . metformin (FORTAMET) 1000 MG (OSM) 24 hr tablet TAKE 1 TABLET TWICE A DAY 180 tablet 1  . metoprolol (LOPRESSOR) 100 MG tablet take 1 tablet by mouth twice a day 180 tablet 3  . Multiple Vitamins-Minerals (CENTRUM ADULTS) TABS Take 1 tablet by mouth daily.    . mupirocin ointment (BACTROBAN) 2 % Apply 1 application topically 2 (two) times daily as needed (affected areas).     . Omega-3 Fatty Acids (FISH OIL) 1000 MG CAPS Take 2 capsules by mouth 2 (two) times daily.     . rivaroxaban (XARELTO) 20 MG TABS tablet Take 1 tablet (20 mg total) by mouth daily with supper. (Patient taking differently: Take 20 mg by mouth daily. ) 30 tablet 11  . rosuvastatin (CRESTOR) 10 MG tablet Take 1 tablet (10 mg total) by mouth daily. 90 tablet 3  . tamsulosin (FLOMAX) 0.4 MG CAPS capsule Take 0.4 mg by mouth daily.     [name] triamcinolone cream (KENALOG) 0.1 % Apply 1 application topically 2 (two) times daily as needed (affected area).     [name] VICTOZA 18 MG/3ML SOPN INJECT 0.3ML (=1.'8MG'$ )      SUBCUTANEOUSLY DAILY 27 mL 1   No current facility-administered medications for this visit.    Allergies:   Niacin    Social History:  The patient  reports that he has been smoking Cigarettes.  He has a 44 pack-year smoking history. He has never used smokeless tobacco. He reports that he drinks alcohol. He reports that he does not use illicit drugs.   Family History:  The patient's family history includes Crohn's disease in his son; Diabetes in his  brother, brother, mother, and paternal grandmother; Heart attack (age of onset: 42) in his brother; Hypertension in his mother; Lung cancer in his father; Stroke (age of onset: 72) in his mother; Throat cancer in his paternal uncle. There is no history of Colon cancer, Esophageal cancer, Rectal cancer, or Stomach cancer.    ROS:  General:no colds or fevers, no weight changes Skin:no rashes or ulcers HEENT:no blurred vision, no congestion CV:see HPI PUL:see HPI GI:no diarrhea constipation or melena, no indigestion GU:no hematuria, no dysuria MS:no joint pain, no claudication Neuro:no syncope, no lightheadedness Endo:+ diabetes, no thyroid disease  Wt Readings from Last 3 Encounters:  03/04/16 265 lb 12.8 oz (120.566 kg)  02/29/16 258 lb 3.2 oz (117.119 kg)  02/25/16 268 lb (121.564 kg)     PHYSICAL EXAM: VS:  BP 102/58 mmHg  Pulse 61  Ht '5\' 11"'$  (1.803 m)  Wt 265 lb 12.8 oz (120.566 kg)  BMI 37.09 kg/m2  SpO2 95% , BMI Body mass index is 37.09 kg/(m^2). General:Pleasant affect, NAD Skin:Warm and dry, brisk capillary refill HEENT:normocephalic, sclera clear, mucus membranes moist Neck:supple, + JVD,2-3 cm no bruits  Heart:S1S2 RRR with soft sytolic murmur, [place], rub or click Lungs: with rales, +rhonchi, and wheezes YKD:XIPJ, non tender, + BS, do not palpate liver spleen or masses Ext:+ lower ext edema- bil to below the knees.  2+ radial pulses Neuro:alert and oriented , MAE, follows commands, + facial symmetry    EKG:  EKG is ordered today. The ekg ordered today demonstrates a flutter with RVR 111 no acute changes otherwise    Recent Labs: 09/19/2015: ALT 18 11/15/2015: TSH 0.84 02/25/2016: B Natriuretic Peptide 226.9* 02/27/2016: Hemoglobin 14.6; Platelets 157 02/28/2016: Magnesium 2.4 02/29/2016: BUN 17; Creatinine, Ser 1.24; Potassium 4.0; Sodium 137    Lipid Panel    Component Value Date/Time   CHOL 110 05/29/2015 1116   TRIG 96.0 05/29/2015 1116   HDL 38.20*  05/29/2015 1116   CHOLHDL 3 05/29/2015 1116   VLDL 19.2 05/29/2015 1116   LDLCALC 53 05/29/2015 1116       Other studies Reviewed: Additional studies/ records that were reviewed today include: .  Echo: limited 02/26/16  Study Conclusions  - Left ventricle: Global longitudinal LV strain is calculated at  -11.7% but is inaccurate due to poor tracking. The cavity size  was normal. There was moderate concentric hypertrophy. Systolic  function was normal. The estimated ejection fraction was in the  range of 55% to 60%. Wall motion was normal; there were no  regional wall motion abnormalities. Normal sinus rhythm was  absent. The study is not technically sufficient to allow  evaluation of LV diastolic function. - Aortic valve: Poorly visualized. Trileaflet; normal thickness,  mildly calcified leaflets. There was mild regurgitation. - Mitral valve: Calcified annulus. There was mild regurgitation. - Tricuspid valve: There was mild regurgitation.  ECHO 01/21/16  Study Conclusions  - Left ventricle: Systolic function was normal. The estimated  ejection fraction was in the range of 60% to 65%. Wall motion was  normal; there were no regional wall motion abnormalities. The  study is not technically sufficient to allow evaluation of LV  diastolic function. - Aortic valve: Sclerosis without stenosis. There was trivial  regurgitation. - Aorta: Aortic root dimension: 39 mm (ED). Ascending aortic  diameter: 39 mm (S). - Aortic root: The aortic root was top normal in size. - Ascending aorta: The ascending aorta was top normal in size. - Mitral valve: Mildly thickened leaflets . There was trivial  regurgitation. - Left atrium: The atrium was at the upper limits of normal in  size. - Right atrium: The atrium was at the upper limits of normal in  size. - Tricuspid valve: There was mild regurgitation. - Pulmonary arteries: PA peak pressure: 39 mm Hg (S). - Inferior vena  cava: The  vessel was dilated. The respirophasic  diameter changes were blunted (< 50%), consistent with elevated  central venous pressure.  Impressions:  - Compared to a prior echo in 2013, the LVEF is higher at 60-65%,  atrial fib/flutter is present, the aorta is top normal in size at  3.9 cm, there borderline biatrial enlargement, mild TR, RVSP 39  mmHg, dilated IVC, trivial AI.    Venous doppler: Summary:  - No evidence of deep vein or superficial thrombosis involving the  right lower extremity and left lower extremity. - Bilateral lymph nodes-right measure 1.6 cm x 3.3 cm. Left  measures 2.2 cm x 2.2 cm. - No evidence of Baker&'s cyst on the right or left.  ASSESSMENT AND PLAN:  1. Acute on chronic diastolic heart failure with volume overload,  LVEF 55-60% by follow-up echocardiogram. Now post hospital with some wt gain but rales and + JVD, lower ext edema is present.  Increase lasix to 80 daily for 5 days then resume 40 mg daily.  Recheck BMP today.  Follow up with APP in 3 weeks unless increased edema or SOb in between.    2. Chronic atrial fibrillation status post previous failed ablation attempts. He continues with strategy of heart rate control and anticoagulation.  His HR has been up on occasion and walking in it was elevated.  will diuresis and see if it helps. To se Dr. Curt Bears in July.   3. COPD with tobacco abuse. He thinks about stopping but has not. + wheezes- he usedinhaler earlier.   4. CAD with severely diseased distal circumflex and total occlusion of a small codominant RCA. This has been managed medically. No active angina symptoms or obvious ACS.   Current medicines are reviewed with the patient today.  The patient Has no concerns regarding medicines.  The following changes have been made:  See above Labs/ tests ordered today include:see above  Disposition:   FU:  see above  Signed, Cecilie Kicks, NP  03/04/2016 12:40 PM    [address] [facility] [address], [place], [place], [place] Phone: [phone]; Fax: [phone]

## 2016-03-04 ENCOUNTER — Ambulatory Visit (INDEPENDENT_AMBULATORY_CARE_PROVIDER_SITE_OTHER): Payer: Medicare Other | Admitting: Cardiology

## 2016-03-04 ENCOUNTER — Encounter: Payer: Self-pay | Admitting: Cardiology

## 2016-03-04 ENCOUNTER — Other Ambulatory Visit: Payer: Self-pay

## 2016-03-04 VITALS — BP 102/58 | HR 61 | Ht 71.0 in | Wt 265.8 lb

## 2016-03-04 DIAGNOSIS — I5033 Acute on chronic diastolic (congestive) heart failure: Secondary | ICD-10-CM

## 2016-03-04 DIAGNOSIS — I208 Other forms of angina pectoris: Secondary | ICD-10-CM | POA: Diagnosis not present

## 2016-03-04 DIAGNOSIS — I4891 Unspecified atrial fibrillation: Secondary | ICD-10-CM | POA: Diagnosis not present

## 2016-03-04 DIAGNOSIS — L97929 Non-pressure chronic ulcer of unspecified part of left lower leg with unspecified severity: Secondary | ICD-10-CM

## 2016-03-04 DIAGNOSIS — I4892 Unspecified atrial flutter: Secondary | ICD-10-CM | POA: Diagnosis not present

## 2016-03-04 DIAGNOSIS — I480 Paroxysmal atrial fibrillation: Secondary | ICD-10-CM | POA: Diagnosis not present

## 2016-03-04 DIAGNOSIS — L97919 Non-pressure chronic ulcer of unspecified part of right lower leg with unspecified severity: Secondary | ICD-10-CM

## 2016-03-04 LAB — BASIC METABOLIC PANEL
BUN: 18 mg/dL (ref 7–25)
CHLORIDE: 104 mmol/L (ref 98–110)
CO2: 21 mmol/L (ref 20–31)
CREATININE: 1.47 mg/dL — AB (ref 0.70–1.25)
Calcium: 9.3 mg/dL (ref 8.6–10.3)
GLUCOSE: 97 mg/dL (ref 65–99)
Potassium: 4 mmol/L (ref 3.5–5.3)
Sodium: 136 mmol/L (ref 135–146)

## 2016-03-04 MED ORDER — FUROSEMIDE 40 MG PO TABS: ORAL_TABLET | ORAL | Status: DC

## 2016-03-04 NOTE — Patient Instructions (Addendum)
Medication Instructions:  Your physician has recommended you make the following change in your medication:  1.  INCREASE the Lasix to 40 mg taking 2 tablets in the morning X's 5 days then go back to 1 tablet daily and 1 extra if wt increases    Labwork: Today:  BMET  Testing/Procedures: None ordered  Follow-Up: Your physician recommends that you schedule a follow-up appointment in: [place] [facility] EXT[facility] Your physician recommends that you schedule a follow-up appointment in: SEE DR. Curt Bears AS PLANNED   Any Other Special Instructions Will Be Listed Below (If Applicable).     If you need a refill on your cardiac medications before your next appointment, please call your pharmacy.

## 2016-03-05 ENCOUNTER — Telehealth: Payer: Self-pay | Admitting: Cardiology

## 2016-03-05 DIAGNOSIS — I10 Essential (primary) hypertension: Secondary | ICD-10-CM

## 2016-03-05 NOTE — Telephone Encounter (Signed)
Pt is returning your call in regards to his lab results. Please f/u with him.

## 2016-03-05 NOTE — Telephone Encounter (Signed)
Pt aware of his lab results.  He will repeat bmet 6/22/170 Order in epic.

## 2016-03-05 NOTE — Telephone Encounter (Signed)
Returned pts call re: lab results.  Left message for pt to call me back

## 2016-03-05 NOTE — Telephone Encounter (Signed)
Pt returning call to [facility]  - re labs -pls call after 12 @ [phone]

## 2016-03-07 DIAGNOSIS — L03115 Cellulitis of right lower limb: Secondary | ICD-10-CM | POA: Diagnosis not present

## 2016-03-07 DIAGNOSIS — L97819 Non-pressure chronic ulcer of other part of right lower leg with unspecified severity: Secondary | ICD-10-CM | POA: Diagnosis not present

## 2016-03-09 ENCOUNTER — Ambulatory Visit: Payer: Medicare Other | Admitting: Nurse Practitioner

## 2016-03-09 ENCOUNTER — Inpatient Hospital Stay: Payer: Medicare Other | Admitting: Internal Medicine

## 2016-03-09 DIAGNOSIS — Z0289 Encounter for other administrative examinations: Secondary | ICD-10-CM

## 2016-03-11 DIAGNOSIS — I83028 Varicose veins of left lower extremity with ulcer other part of lower leg: Secondary | ICD-10-CM | POA: Diagnosis not present

## 2016-03-11 DIAGNOSIS — I83018 Varicose veins of right lower extremity with ulcer other part of lower leg: Secondary | ICD-10-CM | POA: Diagnosis not present

## 2016-03-11 DIAGNOSIS — E1151 Type 2 diabetes mellitus with diabetic peripheral angiopathy without gangrene: Secondary | ICD-10-CM | POA: Diagnosis not present

## 2016-03-11 DIAGNOSIS — R600 Localized edema: Secondary | ICD-10-CM | POA: Diagnosis not present

## 2016-03-12 ENCOUNTER — Other Ambulatory Visit (INDEPENDENT_AMBULATORY_CARE_PROVIDER_SITE_OTHER): Payer: Medicare Other | Admitting: *Deleted

## 2016-03-12 DIAGNOSIS — I10 Essential (primary) hypertension: Secondary | ICD-10-CM

## 2016-03-12 LAB — BASIC METABOLIC PANEL
BUN: 16 mg/dL (ref 7–25)
CALCIUM: 8.9 mg/dL (ref 8.6–10.3)
CO2: 25 mmol/L (ref 20–31)
Chloride: 108 mmol/L (ref 98–110)
Creat: 1.27 mg/dL — ABNORMAL HIGH (ref 0.70–1.25)
GLUCOSE: 84 mg/dL (ref 65–99)
Potassium: 3.9 mmol/L (ref 3.5–5.3)
Sodium: 140 mmol/L (ref 135–146)

## 2016-03-13 ENCOUNTER — Telehealth: Payer: Self-pay | Admitting: Cardiology

## 2016-03-13 DIAGNOSIS — L97819 Non-pressure chronic ulcer of other part of right lower leg with unspecified severity: Secondary | ICD-10-CM | POA: Diagnosis not present

## 2016-03-13 DIAGNOSIS — L03115 Cellulitis of right lower limb: Secondary | ICD-10-CM | POA: Diagnosis not present

## 2016-03-13 NOTE — Telephone Encounter (Signed)
-----   Message from Isaiah Serge, NP sent at 03/12/2016  5:30 PM EDT ----- Labs stable a little improved.

## 2016-03-13 NOTE — Telephone Encounter (Signed)
New message  ° ° °Patient calling back for test results.  °

## 2016-03-13 NOTE — Telephone Encounter (Signed)
Pt aware of lab results and verbalized understanding.

## 2016-03-16 DIAGNOSIS — L03115 Cellulitis of right lower limb: Secondary | ICD-10-CM | POA: Diagnosis not present

## 2016-03-16 DIAGNOSIS — L97819 Non-pressure chronic ulcer of other part of right lower leg with unspecified severity: Secondary | ICD-10-CM | POA: Diagnosis not present

## 2016-03-18 DIAGNOSIS — R600 Localized edema: Secondary | ICD-10-CM | POA: Diagnosis not present

## 2016-03-18 DIAGNOSIS — E1151 Type 2 diabetes mellitus with diabetic peripheral angiopathy without gangrene: Secondary | ICD-10-CM | POA: Diagnosis not present

## 2016-03-18 DIAGNOSIS — L97819 Non-pressure chronic ulcer of other part of right lower leg with unspecified severity: Secondary | ICD-10-CM | POA: Diagnosis not present

## 2016-03-18 DIAGNOSIS — I83028 Varicose veins of left lower extremity with ulcer other part of lower leg: Secondary | ICD-10-CM | POA: Diagnosis not present

## 2016-03-18 DIAGNOSIS — I83018 Varicose veins of right lower extremity with ulcer other part of lower leg: Secondary | ICD-10-CM | POA: Diagnosis not present

## 2016-03-18 DIAGNOSIS — L03115 Cellulitis of right lower limb: Secondary | ICD-10-CM | POA: Diagnosis not present

## 2016-03-20 DIAGNOSIS — L97819 Non-pressure chronic ulcer of other part of right lower leg with unspecified severity: Secondary | ICD-10-CM | POA: Diagnosis not present

## 2016-03-20 DIAGNOSIS — L03115 Cellulitis of right lower limb: Secondary | ICD-10-CM | POA: Diagnosis not present

## 2016-03-23 DIAGNOSIS — L03115 Cellulitis of right lower limb: Secondary | ICD-10-CM | POA: Diagnosis not present

## 2016-03-23 DIAGNOSIS — L97819 Non-pressure chronic ulcer of other part of right lower leg with unspecified severity: Secondary | ICD-10-CM | POA: Diagnosis not present

## 2016-03-25 DIAGNOSIS — L400 Psoriasis vulgaris: Secondary | ICD-10-CM | POA: Diagnosis not present

## 2016-03-25 DIAGNOSIS — I83028 Varicose veins of left lower extremity with ulcer other part of lower leg: Secondary | ICD-10-CM | POA: Diagnosis not present

## 2016-03-25 DIAGNOSIS — R600 Localized edema: Secondary | ICD-10-CM | POA: Diagnosis not present

## 2016-03-25 DIAGNOSIS — E1151 Type 2 diabetes mellitus with diabetic peripheral angiopathy without gangrene: Secondary | ICD-10-CM | POA: Diagnosis not present

## 2016-03-25 DIAGNOSIS — I83018 Varicose veins of right lower extremity with ulcer other part of lower leg: Secondary | ICD-10-CM | POA: Diagnosis not present

## 2016-03-25 NOTE — Progress Notes (Signed)
Cardiology Office Note   Date:  03/26/2016   ID:  Neil Pizano., DOB 1950-12-23, MRN [id number]  PCP:  Neil Koch, MD  Cardiologist:  Dr. Burt Knack     Chief Complaint  Patient presents with  . Congestive Heart Failure    3 weeks      History of Present Illness: Neil Haberland. is a 65 y.o. male who presents for heart failure follow up.  He has a hx. ofCAD, PVD, paroxysmal atrial flutter/fibrillation with 2 failed ablations, chronic anticoagulation therapy with Xarelto, chronic diastolic HF, HTN, HLD, DM, COPD and ongoing tobacco abuse, admitted for acute on chronic diastolic CHF and atrial firbrillation with RVR. Xarelto for a/c given CHA2DS2 VASc score of 5 (CHF, HTN, DM, Age 62-74 and Vascular disease). Scheduled for sleep study 04/03/16.  Wt on admit was 278 lbs. At discharge 258 LBS After diuresing 5500 cc He was instructed at discharge that if wt. goes up 2-3 pounds within 24 hours he should take an additional Lasix 40 mg dose, otherwise would continue on the Lasix 40 mg daily for now- currently he has no scales.   Leg wounds are followed and legs are wrapped. Still with minimal edema of lower ext. Is now on antibiotic for his leg ulcer.   Pt feels well today.  No significant SOB.  He has not yet obtained scales but does plan to do this.  He continues to smoke 1.5 ppd down from 2 ppd.  We discussed maybe stop smoking in the house.  He stated he would try to cut back.   He does want to know if he should continue enbrel - will check with Dr. Burt Knack.  But for now he will continue.   Past Medical History  Diagnosis Date  . Other peripheral vascular disease(443.89)     bilateral lower extremity  . Hyperlipidemia   . Hypertension   . Transient global amnesia   . Contact dermatitis and other eczema due to plants (except food)   . Tobacco use disorder     dependent  . Atrial flutter ([place])     onset  2011. s/p EPS/RFA 12/2011  . Type II  diabetes mellitus ([place])   . Chronic bronchitis   . GERD (gastroesophageal reflux disease)   . LV dysfunction     EF 45-50% 12/2011  . Adenomatous colon polyp   . COPD (chronic obstructive pulmonary disease) ([place])   . Arthritis     left hip replacement  . Cataract   . Tuberculosis     Past Surgical History  Procedure Laterality Date  . Partial hip arthroplasty Right 01/2000    "Right; replaced ball & stem"  . Partial hip arthroplasty Left   . Cystoscopy  11/2007    Dr Jeffie Pollock  . Cardiac electrophysiology mapping and ablation  03/2010  . Tee without cardioversion  12/28/2011    Procedure: TRANSESOPHAGEAL ECHOCARDIOGRAM (TEE);  Surgeon: Larey Dresser, MD;  Location: [place];  Service: Cardiovascular;  Laterality: N/A;  . Colonoscopy with polypectomy  2006 & 2011    Dr [name]  . A flutter ablation N/A 12/28/2011    Procedure: ABLATION A FLUTTER;  Surgeon: Evans Lance, MD;  Location: [facility] CATH LAB;  Service: Cardiovascular;  Laterality: N/A;  . Colonoscopy    . Polypectomy    . Cardiac catheterization N/A 07/10/2015    Procedure: Left Heart Cath and Coronary Angiography;  Surgeon: Sherren Mocha, MD;  Location: [place] CV LAB;  Service: Cardiovascular;  Laterality: N/A;     Current Outpatient Prescriptions  Medication Sig Dispense Refill  . acetaminophen (TYLENOL) 650 MG CR tablet Take 650 mg by mouth every 8 (eight) hours as needed for pain.    . cilostazol (PLETAL) 100 MG tablet Take 1 tablet (100 mg total) by mouth 2 (two) times daily. 180 tablet 1  . clobetasol (OLUX) 0.05 % topical foam Apply topically 2 (two) times daily.   0  . collagenase (SANTYL) ointment Apply topically daily. 15 g 0  . diltiazem (CARDIZEM CD) 180 MG 24 hr capsule Take 1 capsule (180 mg total) by mouth daily. 30 capsule 0  . etanercept (ENBREL SURECLICK) 50 MG/ML injection Inject 50 mg into the skin 2 (two) times a week. Inject on Sundays & Wednesdays    . Flaxseed, Linseed, (FLAX SEED OIL) 1000 MG  CAPS Take 1 capsule by mouth 2 (two) times daily.     . Fluticasone-Salmeterol (ADVAIR) 100-50 MCG/DOSE AEPB Inhale 1 puff into the lungs 2 (two) times daily.    . furosemide (LASIX) 40 MG tablet Take 1 tablet (40 mg total) by mouth daily. 90 tablet 0  . Insulin Detemir (LEVEMIR FLEXTOUCH) 100 UNIT/ML Pen INJECT 24 UNITS INTO THE   SKIN DAILY 30 mL 1  . losartan (COZAAR) 25 MG tablet Take 3 tablets (75 mg total) by mouth daily. 30 tablet 0  . metoprolol (LOPRESSOR) 100 MG tablet take 1 tablet by mouth twice a day 180 tablet 3  . Multiple Vitamins-Minerals (CENTRUM ADULTS) TABS Take 1 tablet by mouth daily.    . mupirocin ointment (BACTROBAN) 2 % Apply 1 application topically 2 (two) times daily as needed (affected areas).     . Omega-3 Fatty Acids (FISH OIL) 1000 MG CAPS Take 2 capsules by mouth 2 (two) times daily.     . rivaroxaban (XARELTO) 20 MG TABS tablet Take 20 mg by mouth daily with supper.    . rosuvastatin (CRESTOR) 10 MG tablet Take 1 tablet (10 mg total) by mouth daily. 90 tablet 3  . tamsulosin (FLOMAX) 0.4 MG CAPS capsule Take 0.4 mg by mouth daily.     [name] triamcinolone cream (KENALOG) 0.1 % Apply 1 application topically 2 (two) times daily as needed (affected area).     [name] VICTOZA 18 MG/3ML SOPN INJECT 0.3ML (=1.'8MG'$ )      SUBCUTANEOUSLY DAILY 27 mL 1  . metformin (FORTAMET) 1000 MG (OSM) 24 hr tablet TAKE 1 TABLET TWICE A DAY 180 tablet 1   No current facility-administered medications for this visit.    Allergies:   Niacin    Social History:  The patient  reports that he has been smoking Cigarettes.  He has a 44 pack-year smoking history. He has never used smokeless tobacco. He reports that he drinks alcohol. He reports that he does not use illicit drugs.   Family History:  The patient's family history includes Crohn's disease in his son; Diabetes in his brother, brother, mother, and paternal grandmother; Heart attack (age of onset: 37) in his brother; Hypertension in his  mother; Lung cancer in his father; Stroke (age of onset: 58) in his mother; Throat cancer in his paternal uncle. There is no history of Colon cancer, Esophageal cancer, Rectal cancer, or Stomach cancer.    ROS:  General:no colds or fevers, no weight changes Skin:no rashes or ulcers HEENT:no blurred vision, no congestion CV:see HPI PUL:see HPI GI:no diarrhea constipation or melena, no indigestion GU:no hematuria, no dysuria MS:no joint  pain, no claudication Neuro:no syncope, no lightheadedness Endo:+ diabetes, no thyroid disease  Wt Readings from Last 3 Encounters:  03/26/16 267 lb 6.4 oz (121.292 kg)  03/04/16 265 lb 12.8 oz (120.566 kg)  02/29/16 258 lb 3.2 oz (117.119 kg)     PHYSICAL EXAM: VS:  BP 126/82 mmHg  Pulse 85  Ht '5\' 11"'$  (1.803 m)  Wt 267 lb 6.4 oz (121.292 kg)  BMI 37.31 kg/m2 , BMI Body mass index is 37.31 kg/(m^2). General:Pleasant affect, NAD Skin:Warm and dry, brisk capillary refill HEENT:normocephalic, sclera clear, mucus membranes moist Neck:supple, no JVD, no bruits  Heart irreg irreg without murmur, gallup, rub or click Lungs: without rales, rhonchi, + wheezes ZOX:WRUE, non tender, + BS, do not palpate liver spleen or masses Ext:no lower ext edema,Una Boot on rt leg., 2+ radial pulses Neuro:alert and oriented X 3, MAE, follows commands, + facial symmetry    EKG:  EKG is NOT ordered today.    Recent Labs: 09/19/2015: ALT 18 11/15/2015: TSH 0.84 02/25/2016: B Natriuretic Peptide 226.9* 02/27/2016: Hemoglobin 14.6; Platelets 157 02/28/2016: Magnesium 2.4 03/12/2016: BUN 16; Creat 1.27*; Potassium 3.9; Sodium 140    Lipid Panel    Component Value Date/Time   CHOL 110 05/29/2015 1116   TRIG 96.0 05/29/2015 1116   HDL 38.20* 05/29/2015 1116   CHOLHDL 3 05/29/2015 1116   VLDL 19.2 05/29/2015 1116   LDLCALC 53 05/29/2015 1116       Other studies Reviewed: Additional studies/ records that were reviewed today include:  Echo: limited 02/26/16  Study  Conclusions  - Left ventricle: Global longitudinal LV strain is calculated at  -11.7% but is inaccurate due to poor tracking. The cavity size  was normal. There was moderate concentric hypertrophy. Systolic  function was normal. The estimated ejection fraction was in the  range of 55% to 60%. Wall motion was normal; there were no  regional wall motion abnormalities. Normal sinus rhythm was  absent. The study is not technically sufficient to allow  evaluation of LV diastolic function. - Aortic valve: Poorly visualized. Trileaflet; normal thickness,  mildly calcified leaflets. There was mild regurgitation. - Mitral valve: Calcified annulus. There was mild regurgitation. - Tricuspid valve: There was mild regurgitation.  ECHO 01/21/16  Study Conclusions  - Left ventricle: Systolic function was normal. The estimated  ejection fraction was in the range of 60% to 65%. Wall motion was  normal; there were no regional wall motion abnormalities. The  study is not technically sufficient to allow evaluation of LV  diastolic function. - Aortic valve: Sclerosis without stenosis. There was trivial  regurgitation. - Aorta: Aortic root dimension: 39 mm (ED). Ascending aortic  diameter: 39 mm (S). - Aortic root: The aortic root was top normal in size. - Ascending aorta: The ascending aorta was top normal in size. - Mitral valve: Mildly thickened leaflets . There was trivial  regurgitation. - Left atrium: The atrium was at the upper limits of normal in  size. - Right atrium: The atrium was at the upper limits of normal in  size. - Tricuspid valve: There was mild regurgitation. - Pulmonary arteries: PA peak pressure: 39 mm Hg (S). - Inferior vena cava: The vessel was dilated. The respirophasic  diameter changes were blunted (< 50%), consistent with elevated  central venous pressure.  Impressions:  - Compared to a prior echo in 2013, the LVEF is higher at 60-65%,  atrial  fib/flutter is present, the aorta is top normal in size at  3.9 cm,  there borderline biatrial enlargement, mild TR, RVSP 39  mmHg, dilated IVC, trivial AI.    Venous doppler: Summary:  - No evidence of deep vein or superficial thrombosis involving the  right lower extremity and left lower extremity. - Bilateral lymph nodes-right measure 1.6 cm x 3.3 cm. Left  measures 2.2 cm x 2.2 cm. - No evidence of Baker&'s cyst on the right or left.  ASSESSMENT AND PLAN:   1.Chronic diastolic heart failure with volume overload, LVEF 55-60% by follow-up echocardiogram.  Last visit post hospital with some wt gain + rales and + JVD, lower ext edema is present. Iincreased his lasix to 80 daily for 5 days then he was to resume 40 mg daily.Pt has continued with lasix at 80 mg daily.  I have decreased to 40 mg daily.  He is to follow up with Dr. Curt Bears next week and if edema returns then diuretic may need to be increased.    2. Chronic atrial fibrillation status post previous failed ablation attempts. He continues with strategy of heart rate control and anticoagulation. His HR has been up on occasion and walking in it was elevated. with diuresing his HR has improved now 85. To se Dr. Curt Bears in July.   3. COPD with tobacco abuse. He thinks about stopping but has not. + wheezes- he used inhaler earlier. We again discussed at least stop smoking in his home but he smokes most when he has trouble sleeping.     4. CAD with severely diseased distal circumflex and total occlusion of a small codominant RCA. This has been managed medically. No active angina symptoms or obvious ACS.   5.  For sleep study July 14.  Current medicines are reviewed with the patient today.  The patient Has no concerns regarding medicines.  The following changes have been made:  See above Labs/ tests ordered today include:see above  Disposition:   FU:  see above  Signed, Cecilie Kicks, NP  03/26/2016 11:31 PM    [facility] [place], [place], [place], [place] Phone: [phone]; Fax: [phone]

## 2016-03-26 ENCOUNTER — Encounter (INDEPENDENT_AMBULATORY_CARE_PROVIDER_SITE_OTHER): Payer: Self-pay

## 2016-03-26 ENCOUNTER — Encounter: Payer: Self-pay | Admitting: Cardiology

## 2016-03-26 ENCOUNTER — Ambulatory Visit (INDEPENDENT_AMBULATORY_CARE_PROVIDER_SITE_OTHER): Payer: Medicare Other | Admitting: Cardiology

## 2016-03-26 ENCOUNTER — Other Ambulatory Visit: Payer: Self-pay | Admitting: Endocrinology

## 2016-03-26 VITALS — BP 126/82 | HR 85 | Ht 71.0 in | Wt 267.4 lb

## 2016-03-26 DIAGNOSIS — I208 Other forms of angina pectoris: Secondary | ICD-10-CM

## 2016-03-26 DIAGNOSIS — I5032 Chronic diastolic (congestive) heart failure: Secondary | ICD-10-CM | POA: Diagnosis not present

## 2016-03-26 DIAGNOSIS — I10 Essential (primary) hypertension: Secondary | ICD-10-CM

## 2016-03-26 DIAGNOSIS — J449 Chronic obstructive pulmonary disease, unspecified: Secondary | ICD-10-CM | POA: Diagnosis not present

## 2016-03-26 DIAGNOSIS — I481 Persistent atrial fibrillation: Secondary | ICD-10-CM

## 2016-03-26 DIAGNOSIS — I4819 Other persistent atrial fibrillation: Secondary | ICD-10-CM

## 2016-03-26 MED ORDER — FUROSEMIDE 40 MG PO TABS: 40.0000 mg | ORAL_TABLET | Freq: Every day | ORAL | Status: DC

## 2016-03-26 NOTE — Patient Instructions (Signed)
Medication Instructions:  Your physician has recommended you make the following change in your medication:  1. Decrease Lasix (40 mg ) daily   Labwork: -None  Testing/Procedures: -None  Follow-Up: Your physician wants you to follow-up in: 3 months with Dr. Burt Knack.  You will receive a reminder letter in the mail two months in advance. If you don't receive a letter, please call our office to schedule the follow-up appointment.   Any Other Special Instructions Will Be Listed Below (If Applicable).     If you need a refill on your cardiac medications before your next appointment, please call your pharmacy.

## 2016-03-27 DIAGNOSIS — L97819 Non-pressure chronic ulcer of other part of right lower leg with unspecified severity: Secondary | ICD-10-CM | POA: Diagnosis not present

## 2016-03-27 DIAGNOSIS — L03115 Cellulitis of right lower limb: Secondary | ICD-10-CM | POA: Diagnosis not present

## 2016-03-30 DIAGNOSIS — I83028 Varicose veins of left lower extremity with ulcer other part of lower leg: Secondary | ICD-10-CM | POA: Diagnosis not present

## 2016-03-30 DIAGNOSIS — E1151 Type 2 diabetes mellitus with diabetic peripheral angiopathy without gangrene: Secondary | ICD-10-CM | POA: Diagnosis not present

## 2016-03-30 DIAGNOSIS — R600 Localized edema: Secondary | ICD-10-CM | POA: Diagnosis not present

## 2016-03-30 DIAGNOSIS — I83018 Varicose veins of right lower extremity with ulcer other part of lower leg: Secondary | ICD-10-CM | POA: Diagnosis not present

## 2016-04-01 ENCOUNTER — Telehealth: Payer: Self-pay

## 2016-04-01 ENCOUNTER — Other Ambulatory Visit: Payer: Self-pay | Admitting: *Deleted

## 2016-04-01 ENCOUNTER — Other Ambulatory Visit: Payer: Self-pay

## 2016-04-01 DIAGNOSIS — L97819 Non-pressure chronic ulcer of other part of right lower leg with unspecified severity: Secondary | ICD-10-CM | POA: Diagnosis not present

## 2016-04-01 DIAGNOSIS — L03115 Cellulitis of right lower limb: Secondary | ICD-10-CM | POA: Diagnosis not present

## 2016-04-01 MED ORDER — FLUTICASONE-SALMETEROL 100-50 MCG/DOSE IN AEPB: 1.0000 | INHALATION_SPRAY | Freq: Two times a day (BID) | RESPIRATORY_TRACT | Status: DC

## 2016-04-01 MED ORDER — LIRAGLUTIDE 18 MG/3ML ~~LOC~~ SOPN: PEN_INJECTOR | SUBCUTANEOUS | Status: DC

## 2016-04-01 NOTE — Telephone Encounter (Signed)
Patient is calling about Cartia 180 mg. Spoke with Neil Carroll at last ov (03/26/16) about and she was suppose to get back with him after speaking with cardiologist (Neil Carroll/Neil Carroll) but has not yet. Patient would like to know if he need that med refilled or to stay off of it. Please call him back at [phone] on what to do.

## 2016-04-01 NOTE — Telephone Encounter (Signed)
Left msg on triage stating [facility] has been trying to get his Advair refill. Per mail service they have not heard back from office. Sent refill electronically to [facility]...Johny Chess

## 2016-04-03 ENCOUNTER — Encounter (HOSPITAL_BASED_OUTPATIENT_CLINIC_OR_DEPARTMENT_OTHER): Payer: Medicare Other

## 2016-04-03 DIAGNOSIS — L03115 Cellulitis of right lower limb: Secondary | ICD-10-CM | POA: Diagnosis not present

## 2016-04-03 DIAGNOSIS — L97819 Non-pressure chronic ulcer of other part of right lower leg with unspecified severity: Secondary | ICD-10-CM | POA: Diagnosis not present

## 2016-04-05 ENCOUNTER — Ambulatory Visit (HOSPITAL_BASED_OUTPATIENT_CLINIC_OR_DEPARTMENT_OTHER): Payer: Medicare Other | Attending: Cardiology | Admitting: Cardiology

## 2016-04-05 VITALS — Ht 71.0 in | Wt 269.0 lb

## 2016-04-05 DIAGNOSIS — I480 Paroxysmal atrial fibrillation: Secondary | ICD-10-CM | POA: Diagnosis not present

## 2016-04-05 DIAGNOSIS — R0683 Snoring: Secondary | ICD-10-CM | POA: Diagnosis not present

## 2016-04-05 DIAGNOSIS — G4733 Obstructive sleep apnea (adult) (pediatric): Secondary | ICD-10-CM | POA: Insufficient documentation

## 2016-04-05 DIAGNOSIS — G4736 Sleep related hypoventilation in conditions classified elsewhere: Secondary | ICD-10-CM | POA: Diagnosis not present

## 2016-04-05 DIAGNOSIS — I481 Persistent atrial fibrillation: Secondary | ICD-10-CM | POA: Diagnosis present

## 2016-04-06 ENCOUNTER — Other Ambulatory Visit (HOSPITAL_BASED_OUTPATIENT_CLINIC_OR_DEPARTMENT_OTHER): Payer: Self-pay

## 2016-04-06 DIAGNOSIS — L03115 Cellulitis of right lower limb: Secondary | ICD-10-CM | POA: Diagnosis not present

## 2016-04-06 DIAGNOSIS — I480 Paroxysmal atrial fibrillation: Secondary | ICD-10-CM

## 2016-04-06 DIAGNOSIS — L97819 Non-pressure chronic ulcer of other part of right lower leg with unspecified severity: Secondary | ICD-10-CM | POA: Diagnosis not present

## 2016-04-06 NOTE — Progress Notes (Signed)
Electrophysiology Office Note   Date:  04/07/2016   ID:  Neil Carroll., DOB 1951-04-26, MRN [id number]  PCP:  Hoyt Koch, MD  Cardiologist:  Burt Knack Primary Electrophysiologist:  Will Meredith Leeds, MD    Chief Complaint  Patient presents with  . Follow-up    3 months     History of Present Illness: Neil Carroll. is a 65 y.o. male who presents today for electrophysiology evaluation.   She has a history of atrial flutter status post redo frequency ablation, peripheral arterial disease with known SFA occlusive disease bilaterally, HTN, DM, COPD, tobacco use and CAD. He recently underwent a [place] by Dr. Burt Knack 06/2015. This showed severe distal left circumflex stenosis - recommend medical therapy as small amount of myocardium supplied, mild nonobstructive stenosis of a large, wrap around LAD and total occlusion of a small, codominant RCA. Dr. Burt Knack did not feel that PCI would significantly impact symptoms or overall clinical outcome, thus medical therapy was elected. He has been maintained on Plavix, metoprolol, Crestor and Losartan. He also takes Pletal for his PVD. Was diagnosed with atrial flutter after an ER visit and atrial fibrillation after event monitor.   He presented to the hospital the beginning of June with a heart failure exacerbation. He was diuresed and discharged a weight of 258 pounds. Since that time, he is noted to be occasionally more short of breath. He was prescribed Cardizem for his atrial fibrillation, but he ran out of the medication. Since he ran out, he has had much more shortness of breath. He is still able to lie on one pillow at night, but does raise his legs to help with his lower extremity edema.  Today, he denies symptoms of palpitations, orthopnea, PND, claudication, dizziness, presyncope, syncope, bleeding, or neurologic sequela. The patient is tolerating medications without difficulties and is otherwise without complaint today.      Past Medical History  Diagnosis Date  . Other peripheral vascular disease(443.89)     bilateral lower extremity  . Hyperlipidemia   . Hypertension   . Transient global amnesia   . Contact dermatitis and other eczema due to plants (except food)   . Tobacco use disorder     dependent  . Atrial flutter ([place])     onset  2011. s/p EPS/RFA 12/2011  . Type II diabetes mellitus ([place])   . Chronic bronchitis   . GERD (gastroesophageal reflux disease)   . LV dysfunction     EF 45-50% 12/2011  . Adenomatous colon polyp   . COPD (chronic obstructive pulmonary disease) ([place])   . Arthritis     left hip replacement  . Cataract   . Tuberculosis    Past Surgical History  Procedure Laterality Date  . Partial hip arthroplasty Right 01/2000    "Right; replaced ball & stem"  . Partial hip arthroplasty Left   . Cystoscopy  11/2007    Dr Jeffie Pollock  . Cardiac electrophysiology mapping and ablation  03/2010  . Tee without cardioversion  12/28/2011    Procedure: TRANSESOPHAGEAL ECHOCARDIOGRAM (TEE);  Surgeon: Larey Dresser, MD;  Location: [place];  Service: Cardiovascular;  Laterality: N/A;  . Colonoscopy with polypectomy  2006 & 2011    Dr [name]  . A flutter ablation N/A 12/28/2011    Procedure: ABLATION A FLUTTER;  Surgeon: Evans Lance, MD;  Location: [facility] CATH LAB;  Service: Cardiovascular;  Laterality: N/A;  . Colonoscopy    . Polypectomy    .  Cardiac catheterization N/A 07/10/2015    Procedure: Left Heart Cath and Coronary Angiography;  Surgeon: Sherren Mocha, MD;  Location: [place] CV LAB;  Service: Cardiovascular;  Laterality: N/A;     Current Outpatient Prescriptions  Medication Sig Dispense Refill  . acetaminophen (TYLENOL) 650 MG CR tablet Take 650 mg by mouth every 8 (eight) hours as needed for pain.    . cilostazol (PLETAL) 100 MG tablet Take 1 tablet (100 mg total) by mouth 2 (two) times daily. 180 tablet 1  . clobetasol (OLUX) 0.05 % topical foam Apply topically 2 (two)  times daily.   0  . collagenase (SANTYL) ointment Apply topically daily. 15 g 0  . etanercept (ENBREL SURECLICK) 50 MG/ML injection Inject 50 mg into the skin 2 (two) times a week. Inject on Sundays & Wednesdays    . Flaxseed, Linseed, (FLAX SEED OIL) 1000 MG CAPS Take 1 capsule by mouth 2 (two) times daily.     . Fluticasone-Salmeterol (ADVAIR) 100-50 MCG/DOSE AEPB Inhale 1 puff into the lungs 2 (two) times daily. 180 each 1  . furosemide (LASIX) 40 MG tablet Take 1 tablet (40 mg total) by mouth daily. 90 tablet 0  . Insulin Detemir (LEVEMIR FLEXTOUCH) 100 UNIT/ML Pen INJECT 24 UNITS INTO THE   SKIN DAILY 30 mL 1  . Liraglutide (VICTOZA) 18 MG/3ML SOPN INJECT 0.3ML (=1.'8MG'$ )      SUBCUTANEOUSLY DAILY 27 mL 1  . losartan (COZAAR) 100 MG tablet Take 100 mg by mouth daily.    . metformin (FORTAMET) 1000 MG (OSM) 24 hr tablet TAKE 1 TABLET TWICE A DAY 180 tablet 1  . metoprolol (LOPRESSOR) 100 MG tablet take 1 tablet by mouth twice a day 180 tablet 3  . Multiple Vitamins-Minerals (CENTRUM ADULTS) TABS Take 1 tablet by mouth daily.    . mupirocin ointment (BACTROBAN) 2 % Apply 1 application topically 2 (two) times daily as needed (affected areas).     . Omega-3 Fatty Acids (FISH OIL) 1000 MG CAPS Take 2 capsules by mouth 2 (two) times daily.     . rivaroxaban (XARELTO) 20 MG TABS tablet Take 20 mg by mouth daily with supper.    . rosuvastatin (CRESTOR) 10 MG tablet Take 1 tablet (10 mg total) by mouth daily. 90 tablet 3  . tamsulosin (FLOMAX) 0.4 MG CAPS capsule Take 0.4 mg by mouth daily.     [name] triamcinolone cream (KENALOG) 0.1 % Apply 1 application topically 2 (two) times daily as needed (affected area).      No current facility-administered medications for this visit.    Allergies:   Niacin   Social History:  The patient  reports that he has been smoking Cigarettes.  He has a 44 pack-year smoking history. He has never used smokeless tobacco. He reports that he drinks alcohol. He reports that he  does not use illicit drugs.   Family History:  The patient's family history includes Crohn's disease in his son; Diabetes in his brother, brother, mother, and paternal grandmother; Heart attack (age of onset: 25) in his brother; Hypertension in his mother; Lung cancer in his father; Stroke (age of onset: 58) in his mother; Throat cancer in his paternal uncle. There is no history of Colon cancer, Esophageal cancer, Rectal cancer, or Stomach cancer.    ROS:  Please see the history of present illness.   Otherwise, review of systems is positive for Chest pressure, leg swelling, shortness of breath when lying down, waking up short of breath, visual  changes, cough, dyspnea on exertion, snoring, wheezing, anxiety, rash, easy bruising.   All other systems are reviewed and negative.    PHYSICAL EXAM: VS:  BP 140/100 mmHg  Pulse 132  Ht '5\' 11"'$  (1.803 m)  Wt 276 lb 12.8 oz (125.556 kg)  BMI 38.62 kg/m2 , BMI Body mass index is 38.62 kg/(m^2). GEN: Well nourished, well developed, in no acute distress HEENT: normal Neck: no JVD, carotid bruits, or masses Cardiac: tachycardic, irregular; no murmurs, rubs, or gallops, 2-3+ edema to the knee  Respiratory:  Course throughout GI: soft, nontender, nondistended, + BS MS: no deformity or atrophy Skin: warm and dry Neuro:  Strength and sensation are intact Psych: euthymic mood, full affect  EKG:  EKG is ordered today. The ekg ordered today shows atrial fibrillation, rate 132, incomplete RBBB  Recent Labs: 09/19/2015: ALT 18 11/15/2015: TSH 0.84 02/25/2016: B Natriuretic Peptide 226.9* 02/27/2016: Hemoglobin 14.6; Platelets 157 02/28/2016: Magnesium 2.4 03/12/2016: BUN 16; Creat 1.27*; Potassium 3.9; Sodium 140    Lipid Panel     Component Value Date/Time   CHOL 110 05/29/2015 1116   TRIG 96.0 05/29/2015 1116   HDL 38.20* 05/29/2015 1116   CHOLHDL 3 05/29/2015 1116   VLDL 19.2 05/29/2015 1116   LDLCALC 53 05/29/2015 1116     Wt Readings from Last 3  Encounters:  04/07/16 276 lb 12.8 oz (125.556 kg)  04/06/16 269 lb (122.018 kg)  03/26/16 267 lb 6.4 oz (121.292 kg)      Other studies Reviewed: Additional studies/ records that were reviewed today include: 30 day monitor 11/20/15  Review of the above records today demonstrates:  Sinus rhythm and atrial fibrillation No SVT or VT noted.   ASSESSMENT AND PLAN:  1.  Atrial fibrillation/atypical atrial flutter: Has had both atrial fibrillation and what appears to be atypical atrial flutter over the last few months. He is currently not anticoagulated and therefore we'll start him on rivaroxaban today. He is still smoking at this time does not have any interest in quitting. I discussed with him the options of medical management for his atrial fibrillation.  He continues to want to think about the possibility of antiarrhythmic medications. His heart rate is quite elevated today, and we will therefore refill his diltiazem. It is possible that his heart rate is fast due to his increased fluid status.  This patients CHA2DS2-VASc Score and unadjusted Ischemic Stroke Rate (% per year) is equal to 3.2 % stroke rate/year from a score of 3  Above score calculated as 1 point each if present [CHF, HTN, DM, Vascular=MI/PAD/Aortic Plaque, Age if 65-74, or Male] Above score calculated as 2 points each if present [Age > 75, or Stroke/TIA/TE]  2. Hypertension: well controlled  3. Hyperlipidemia: on Crestor  4. diastolic heart failure: Currently with quite a bit of edema and a significant weight gain since his most recent hospital stay. I have instructed him to take 80 mg of Lasix a day for a week to see if this will help with his edema and weight gain. It is possible that this will also help with his rate control. I will have him follow-up with one of our nurse practitioners in 10-14 days.  Current medicines are reviewed at length with the patient today.   The patient does not have concerns regarding his  medicines.  The following changes were made today:  Refill Cardizem, increase Lasix for one week  Labs/ tests ordered today include:  No orders of the defined types were  placed in this encounter.     Disposition:   FU with Will Camnitz 3 months  Signed, Will Meredith Leeds, MD  04/07/2016 11:00 AM     [facility] [address] [place] [address] [phone] (office) [phone] (fax)

## 2016-04-07 ENCOUNTER — Other Ambulatory Visit: Payer: Self-pay | Admitting: *Deleted

## 2016-04-07 ENCOUNTER — Ambulatory Visit (INDEPENDENT_AMBULATORY_CARE_PROVIDER_SITE_OTHER): Payer: Medicare Other | Admitting: Cardiology

## 2016-04-07 ENCOUNTER — Encounter (INDEPENDENT_AMBULATORY_CARE_PROVIDER_SITE_OTHER): Payer: Self-pay

## 2016-04-07 ENCOUNTER — Encounter: Payer: Self-pay | Admitting: Cardiology

## 2016-04-07 VITALS — BP 140/100 | HR 132 | Ht 71.0 in | Wt 276.8 lb

## 2016-04-07 DIAGNOSIS — I208 Other forms of angina pectoris: Secondary | ICD-10-CM | POA: Diagnosis not present

## 2016-04-07 DIAGNOSIS — I483 Typical atrial flutter: Secondary | ICD-10-CM | POA: Diagnosis not present

## 2016-04-07 DIAGNOSIS — I480 Paroxysmal atrial fibrillation: Secondary | ICD-10-CM

## 2016-04-07 MED ORDER — DILTIAZEM HCL ER COATED BEADS 180 MG PO CP24: 180.0000 mg | ORAL_CAPSULE | Freq: Every day | ORAL | Status: DC

## 2016-04-07 NOTE — Patient Instructions (Addendum)
Medication Instructions:  Your physician has recommended you make the following change in your medication:  1) INCREASE Lasix to 80 mg daily for 7 days.  Then reduce to normal dosing of 40 mg daily  --- If you need a refill on your cardiac medications before your next appointment, please call your pharmacy. ---  Labwork: None ordered  Testing/Procedures: None ordered   Follow-Up: Your physician recommends that you schedule a follow-up appointment in: 2 weeks with an extender.  Your physician recommends that you schedule a follow-up appointment in: 3 months with Dr. Curt Bears.  Thank you for choosing [facility]!!   Trinidad Curet, RN [phone]

## 2016-04-07 NOTE — Telephone Encounter (Signed)
Patient called and stated that his rx for diltiazem was supposed to be sent to [facility] so that he can pick it up today.

## 2016-04-07 NOTE — Telephone Encounter (Signed)
I was out of the office through 04/06/16. The pt's medication was addressed today during his office visit with Dr Curt Bears.

## 2016-04-08 DIAGNOSIS — I83018 Varicose veins of right lower extremity with ulcer other part of lower leg: Secondary | ICD-10-CM | POA: Diagnosis not present

## 2016-04-08 DIAGNOSIS — I83028 Varicose veins of left lower extremity with ulcer other part of lower leg: Secondary | ICD-10-CM | POA: Diagnosis not present

## 2016-04-08 DIAGNOSIS — E1151 Type 2 diabetes mellitus with diabetic peripheral angiopathy without gangrene: Secondary | ICD-10-CM | POA: Diagnosis not present

## 2016-04-08 DIAGNOSIS — R600 Localized edema: Secondary | ICD-10-CM | POA: Diagnosis not present

## 2016-04-09 ENCOUNTER — Encounter: Payer: Self-pay | Admitting: Internal Medicine

## 2016-04-09 ENCOUNTER — Ambulatory Visit (INDEPENDENT_AMBULATORY_CARE_PROVIDER_SITE_OTHER): Payer: Medicare Other | Admitting: Internal Medicine

## 2016-04-09 VITALS — BP 136/62 | HR 107 | Temp 98.3°F | Resp 22 | Ht 71.0 in | Wt 274.8 lb

## 2016-04-09 DIAGNOSIS — I4821 Permanent atrial fibrillation: Secondary | ICD-10-CM

## 2016-04-09 DIAGNOSIS — Z720 Tobacco use: Secondary | ICD-10-CM | POA: Diagnosis not present

## 2016-04-09 DIAGNOSIS — I5033 Acute on chronic diastolic (congestive) heart failure: Secondary | ICD-10-CM | POA: Diagnosis not present

## 2016-04-09 DIAGNOSIS — I208 Other forms of angina pectoris: Secondary | ICD-10-CM

## 2016-04-09 DIAGNOSIS — F17200 Nicotine dependence, unspecified, uncomplicated: Secondary | ICD-10-CM

## 2016-04-09 DIAGNOSIS — I482 Chronic atrial fibrillation: Secondary | ICD-10-CM

## 2016-04-09 DIAGNOSIS — I10 Essential (primary) hypertension: Secondary | ICD-10-CM | POA: Diagnosis not present

## 2016-04-09 NOTE — Assessment & Plan Note (Signed)
BP at goal on his current lasix, losartan, diltiazem, metoprolol, flomax.

## 2016-04-09 NOTE — Assessment & Plan Note (Signed)
Reminded that smoking is worsening his current health problems and he is not willing to discuss quitting at this time.

## 2016-04-09 NOTE — Progress Notes (Signed)
Pre visit review using our clinic review tool, if applicable. No additional management support is needed unless otherwise documented below in the visit note. 

## 2016-04-09 NOTE — Assessment & Plan Note (Signed)
Appears to be in flare today. Advised to continue with lasix 80 mg daily until visit back with cardiology on 04/22/16. He is down 2 pounds which is encouraging but still up about 7 pounds from dry weight of 267 pounds from recent hospitalization. Needs BMP at next cardiology visit on 04/22/16.

## 2016-04-09 NOTE — Assessment & Plan Note (Addendum)
Still in A fib today with HR on the high end of normal. Likely because of volume the stress from walking. Did not sound that fast on auscultation. Anticoagulated with xarelto.

## 2016-04-09 NOTE — Patient Instructions (Signed)
I will send a note to the heart doctor to make sure they check your blood work at the next visit.   I would like you to keep taking the 80 mg lasix for another 1-2 weeks (I would recommend until you see the heart doctor) or until you are able to breathe well at night and with walking.

## 2016-04-09 NOTE — Progress Notes (Signed)
   Subjective:    Patient ID: Neil Mam., male    DOB: 29-Oct-1950, 65 y.o.   MRN: [id number]  HPI The patient is a 65 YO man coming in for follow up of recent hospitalization (in for acute on CHF, diuresed and did well with that). He had done okay after the hospital but started feeling worse about 1 week ago. Weight coming back on and breathing worse. More cough again. Somewhat productive clear. Went to see his cardiologist 2 days ago and lasix increased to 80 mg daily and he has done this. Weight down about 2 pounds since that time. Mild relief in symptoms. Still breathing problems with lying flat and with exertion. Able to walk about 100 feet without getting winded. Also with bilateral leg swelling. Not using his inhalers more. No fevers or chills. Still smoking and no desire to quit. No diarrhea, constipation. No abdominal pain. No leg pain. No headaches or migraines.   PMH, [facility], social history reviewed and updated.   Review of Systems  Constitutional: Positive for activity change. Negative for fever, chills, appetite change, fatigue and unexpected weight change.  HENT: Positive for congestion. Negative for ear discharge, ear pain, rhinorrhea, sinus pressure, sore throat and trouble swallowing.   Eyes: Negative.   Respiratory: Positive for cough and shortness of breath. Negative for chest tightness and wheezing.   Cardiovascular: Positive for leg swelling. Negative for chest pain and palpitations.  Gastrointestinal: Negative for nausea, abdominal pain, diarrhea, constipation and abdominal distention.  Musculoskeletal: Negative for myalgias, back pain and arthralgias.  Skin: Positive for color change and wound.  Neurological: Negative.   Psychiatric/Behavioral: Negative.       Objective:   Physical Exam  Constitutional: He is oriented to person, place, and time. He appears well-developed and well-nourished.  Overweight  HENT:  Head: Normocephalic and atraumatic.  Eyes: EOM  are normal.  Neck: Normal range of motion.  Cardiovascular: Regular rhythm.   HR appears to be high 80s to 90s  Pulmonary/Chest: Effort normal. No respiratory distress. He has no wheezes. He has no rales.  Diffuse crackles bilaterally, no wheezing  Abdominal: Soft. Bowel sounds are normal. He exhibits no distension. There is no tenderness. There is no rebound.  Musculoskeletal: He exhibits edema.  2-3+ edema in the legs, chronic venous stasis color change.  Neurological: He is alert and oriented to person, place, and time. Coordination normal.  Skin: Skin is warm and dry.   Filed Vitals:   04/09/16 1003  BP: 136/62  Pulse: 107  Temp: 98.3 F (36.8 C)  TempSrc: Oral  Resp: 22  Height: '5\' 11"'$  (1.803 m)  Weight: 274 lb 12.8 oz (124.648 kg)  SpO2: 94%      Assessment & Plan:

## 2016-04-10 DIAGNOSIS — L97819 Non-pressure chronic ulcer of other part of right lower leg with unspecified severity: Secondary | ICD-10-CM | POA: Diagnosis not present

## 2016-04-10 DIAGNOSIS — L03115 Cellulitis of right lower limb: Secondary | ICD-10-CM | POA: Diagnosis not present

## 2016-04-16 DIAGNOSIS — L409 Psoriasis, unspecified: Secondary | ICD-10-CM | POA: Diagnosis not present

## 2016-04-16 DIAGNOSIS — Z79899 Other long term (current) drug therapy: Secondary | ICD-10-CM | POA: Diagnosis not present

## 2016-04-20 ENCOUNTER — Other Ambulatory Visit: Payer: Self-pay | Admitting: Endocrinology

## 2016-04-22 ENCOUNTER — Encounter: Payer: Self-pay | Admitting: Physician Assistant

## 2016-04-22 ENCOUNTER — Ambulatory Visit (INDEPENDENT_AMBULATORY_CARE_PROVIDER_SITE_OTHER): Payer: Medicare Other | Admitting: Physician Assistant

## 2016-04-22 VITALS — BP 128/76 | HR 109 | Ht 71.0 in | Wt 271.8 lb

## 2016-04-22 DIAGNOSIS — Z720 Tobacco use: Secondary | ICD-10-CM

## 2016-04-22 DIAGNOSIS — I10 Essential (primary) hypertension: Secondary | ICD-10-CM

## 2016-04-22 DIAGNOSIS — I5033 Acute on chronic diastolic (congestive) heart failure: Secondary | ICD-10-CM

## 2016-04-22 DIAGNOSIS — I480 Paroxysmal atrial fibrillation: Secondary | ICD-10-CM | POA: Diagnosis not present

## 2016-04-22 DIAGNOSIS — I208 Other forms of angina pectoris: Secondary | ICD-10-CM

## 2016-04-22 DIAGNOSIS — I2583 Coronary atherosclerosis due to lipid rich plaque: Secondary | ICD-10-CM

## 2016-04-22 DIAGNOSIS — I2510 Atherosclerotic heart disease of native coronary artery without angina pectoris: Secondary | ICD-10-CM

## 2016-04-22 DIAGNOSIS — F17200 Nicotine dependence, unspecified, uncomplicated: Secondary | ICD-10-CM

## 2016-04-22 LAB — BASIC METABOLIC PANEL
BUN: 10 mg/dL (ref 7–25)
CO2: 26 mmol/L (ref 20–31)
Calcium: 8.9 mg/dL (ref 8.6–10.3)
Chloride: 104 mmol/L (ref 98–110)
Creat: 1.21 mg/dL (ref 0.70–1.25)
GLUCOSE: 102 mg/dL — AB (ref 65–99)
Potassium: 4.2 mmol/L (ref 3.5–5.3)
Sodium: 141 mmol/L (ref 135–146)

## 2016-04-22 MED ORDER — FUROSEMIDE 40 MG PO TABS: 80.0000 mg | ORAL_TABLET | Freq: Two times a day (BID) | ORAL | 11 refills | Status: DC

## 2016-04-22 MED ORDER — POTASSIUM CHLORIDE CRYS ER 20 MEQ PO TBCR: 20.0000 meq | EXTENDED_RELEASE_TABLET | Freq: Two times a day (BID) | ORAL | 11 refills | Status: DC

## 2016-04-22 NOTE — Patient Instructions (Addendum)
Medication Instructions:  Your physician has recommended you make the following change in your medication:  INCREASE  FUROSEMIDE  TO  40 MG  2 TABS  TWICE  DAILY  AND   START  KCL   20 MEQ  TWICE  DAILY  Labwork: BMET TODAY   Testing/Procedures: NONE  Follow-Up: Your physician recommends that you schedule a follow-up appointment in:  NEXT WEEK WITH  MICHELE  TOC  SLOT   AT 12;15  PER MICHELE  Any Other Special Instructions Will Be Listed Below (If Applicable).     If you need a refill on your cardiac medications before your next appointment, please call your pharmacy.

## 2016-04-22 NOTE — Progress Notes (Addendum)
Cardiology Office Note    Date:  04/22/2016   ID:  Neil SNUFFER Sr., DOB 06-19-1951, MRN [id number]  PCP:  Hoyt Koch, MD  Cardiologist: Dr. Burt Knack  CC: fluid build up  History of Present Illness:  Neil Altland. is a 65 y.o. male   has a history of atrial flutter status post redo frequency ablation, peripheral arterial disease with known SFA occlusive disease bilaterally, HTN, DM, COPD, tobacco use and CAD. He recently underwent a [place] by Dr. Burt Knack 06/2015. This showed severe distal left circumflex stenosis - recommend medical therapy as small amount of myocardium supplied, mild nonobstructive stenosis of a large, wrap around LAD and total occlusion of a small, codominant RCA. Dr. Burt Knack did not feel that PCI would significantly impact symptoms or overall clinical outcome, thus medical therapy was elected. He has been maintained on Plavix, metoprolol, Crestor and Losartan. He also takes Pletal for his PVD. Was diagnosed with atrial flutter after an ER visit and atrial fibrillation after event monitor.     He presented to the hospital the beginning of June with a heart failure exacerbation. He was diuresed and discharged a weight of 258 pounds. Since that time, he is noted to be occasionally more short of breath. He was prescribed Cardizem for his atrial fibrillation, but he ran out of the medication. Since he ran out, he has had much more shortness of breath. He is still able to lie on one pillow at night, but does raise his legs to help with his lower extremity edema.  Patient saw Dr. Curt Bears 04/07/16 for both atrial fibrillation and what appears to be atypical atrial flutter over the past few months. He was started on rivaroxaban for CHADSVASC of 3. And diltiazem was renewed as his heart rate was fast. He had diastolic heart failure with quite a bit of edema and significant weight gain since his recent hospital stay. He was placed on Lasix 80 mg a day for a week to see  if this helped. Rate control should also help. He is here for follow-up.  His primary care actually had the patient continue Lasix 80 mg daily because of ongoing edema. He has lost a total of 5 pounds but is still 13 pounds over his discharge weight in June. He has cut back on salt but is still getting some with [employer] sausage etc. heart rate is still 109 bpm today. Denies chest pain, palpitations, dizziness or presyncope. Does have dyspnea, dyspnea on exertion and edema. Still smoking 1 ppd.         Past Medical History:  Diagnosis Date  . Adenomatous colon polyp   . Arthritis    left hip replacement  . Atrial flutter ([place])    onset  2011. s/p EPS/RFA 12/2011  . Cataract   . Chronic bronchitis   . Contact dermatitis and other eczema due to plants (except food)   . COPD (chronic obstructive pulmonary disease) ([place])   . GERD (gastroesophageal reflux disease)   . Hyperlipidemia   . Hypertension   . LV dysfunction    EF 45-50% 12/2011  . Other peripheral vascular disease(443.89)    bilateral lower extremity  . Tobacco use disorder    dependent  . Transient global amnesia   . Tuberculosis   . Type II diabetes mellitus ([place])     Past Surgical History:  Procedure Laterality Date  . A FLUTTER ABLATION N/A 12/28/2011   Procedure: ABLATION A FLUTTER;  Surgeon: Champ Mungo  Lovena Le, MD;  Location[facility] CATH LAB;  Service: Cardiovascular;  Laterality: N/A;  . CARDIAC CATHETERIZATION N/A 07/10/2015   Procedure: Left Heart Cath and Coronary Angiography;  Surgeon: Sherren Mocha, MD;  Location: [facility] CV LAB;  Service: Cardiovascular;  Laterality: N/A;  . CARDIAC ELECTROPHYSIOLOGY MAPPING AND ABLATION  03/2010  . COLONOSCOPY    . colonoscopy with polypectomy  2006 & 2011   Dr [name]  . CYSTOSCOPY  11/2007   Dr Jeffie Pollock  . PARTIAL HIP ARTHROPLASTY Right 01/2000   "Right; replaced ball & stem"  . PARTIAL HIP ARTHROPLASTY Left   . POLYPECTOMY    . TEE WITHOUT CARDIOVERSION  12/28/2011    Procedure: TRANSESOPHAGEAL ECHOCARDIOGRAM (TEE);  Surgeon: Larey Dresser, MD;  Location: [facility] ENDOSCOPY;  Service: Cardiovascular;  Laterality: N/A;    Current Medications: Outpatient Medications Prior to Visit  Medication Sig Dispense Refill  . acetaminophen (TYLENOL) 650 MG CR tablet Take 650 mg by mouth every 8 (eight) hours as needed for pain.    . cilostazol (PLETAL) 100 MG tablet Take 1 tablet (100 mg total) by mouth 2 (two) times daily. 180 tablet 1  . clobetasol (OLUX) 0.05 % topical foam Apply topically 2 (two) times daily.   0  . collagenase (SANTYL) ointment Apply topically daily. 15 g 0  . diltiazem (CARDIZEM CD) 180 MG 24 hr capsule Take 1 capsule (180 mg total) by mouth daily. 90 capsule 1  . etanercept (ENBREL SURECLICK) 50 MG/ML injection Inject 50 mg into the skin 2 (two) times a week. Inject on Sundays & Wednesdays    . Flaxseed, Linseed, (FLAX SEED OIL) 1000 MG CAPS Take 1 capsule by mouth 2 (two) times daily.     . Fluticasone-Salmeterol (ADVAIR) 100-50 MCG/DOSE AEPB Inhale 1 puff into the lungs 2 (two) times daily. 180 each 1  . LEVEMIR FLEXTOUCH 100 UNIT/ML Pen INJECT 24 UNITS            SUBCUTANEOUSLY DAILY 30 mL 1  . Liraglutide (VICTOZA) 18 MG/3ML SOPN INJECT 0.3ML (=1.'8MG'$ )      SUBCUTANEOUSLY DAILY 27 mL 1  . losartan (COZAAR) 100 MG tablet Take 100 mg by mouth daily.    . metoprolol (LOPRESSOR) 100 MG tablet take 1 tablet by mouth twice a day 180 tablet 3  . Multiple Vitamins-Minerals (CENTRUM ADULTS) TABS Take 1 tablet by mouth daily.    . mupirocin ointment (BACTROBAN) 2 % Apply 1 application topically 2 (two) times daily as needed (affected areas).     . NOVOFINE 32G X 6 MM MISC USE 2 DAILY WITH LEVEMIR AND VICTOZA 100 each 5  . Omega-3 Fatty Acids (FISH OIL) 1000 MG CAPS Take 2 capsules by mouth 2 (two) times daily.     . rivaroxaban (XARELTO) 20 MG TABS tablet Take 20 mg by mouth daily with supper.    . rosuvastatin (CRESTOR) 10 MG tablet Take 1 tablet (10 mg  total) by mouth daily. 90 tablet 3  . tamsulosin (FLOMAX) 0.4 MG CAPS capsule Take 0.4 mg by mouth daily.     [name] triamcinolone cream (KENALOG) 0.1 % Apply 1 application topically 2 (two) times daily as needed (affected area).     . furosemide (LASIX) 40 MG tablet Take 1 tablet (40 mg total) by mouth daily. 90 tablet 0  . metformin (FORTAMET) 1000 MG (OSM) 24 hr tablet TAKE 1 TABLET TWICE A DAY 180 tablet 1   No facility-administered medications prior to visit.      Allergies:  Niacin   Social History   Social History  . Marital status: Widowed    Spouse name: N/A  . Number of children: 5  . Years of education: N/A   Occupational History  . security guard    Social History Main Topics  . Smoking status: Current Every Day Smoker    Packs/day: 1.00    Years: 44.00    Types: Cigarettes  . Smokeless tobacco: Never Used     Comment: started @ age 77, up to 2 ppd;1.25-1.5 ppd as of 11/23/13  . Alcohol use 0.0 oz/week     Comment:  11/23/13 "2-3 drinks per year"  . Drug use: No  . Sexual activity: Not Currently   Other Topics Concern  . None   Social History Narrative  . None     Family History:  The patient's   family history includes Crohn's disease in his son; Diabetes in his brother, brother, mother, and paternal grandmother; Heart attack (age of onset: 75) in his brother; Hypertension in his mother; Lung cancer in his father; Stroke (age of onset: 90) in his mother; Throat cancer in his paternal uncle.   ROS:   Please see the history of present illness.    Review of Systems  Constitution: Negative.  HENT: Negative.   Cardiovascular: Positive for leg swelling and orthopnea.  Respiratory: Positive for cough.   Endocrine: Negative.   Hematologic/Lymphatic: Bruises/bleeds easily.  Musculoskeletal: Positive for myalgias.  Gastrointestinal: Negative.   Genitourinary: Negative.   Neurological: Negative.    All other systems reviewed and are negative.   PHYSICAL EXAM:     VS:  BP 128/76   Pulse (!) 109   Ht '5\' 11"'$  (1.803 m)   Wt 271 lb 12.8 oz (123.3 kg)   BMI 37.91 kg/m   Physical Exam  GEN: Well nourished, well developed, in no acute distress  Neck: Increased JVD, carotid bruits, or masses Cardiac irregular irregular with 2/6 systolic murmur at the left sternal border, no rubs, or gallops  Respiratory:  Decreased breath sounds with bibasilar Rales, diffuse rhonchi and wheezing throughout GI: soft, nontender, nondistended, + BS Ext: Significant edema up to his thighs with cyanosis, cannot palpate distal pulses MS: no deformity or atrophy  Psych: euthymic mood, full affect  Wt Readings from Last 3 Encounters:  04/22/16 271 lb 12.8 oz (123.3 kg)  04/09/16 274 lb 12.8 oz (124.6 kg)  04/07/16 276 lb 12.8 oz (125.6 kg)      Studies/Labs Reviewed:   EKG:  EKG is ordered today.  The ekg ordered today demonstrates Atrial flutter with variable AV block at 109 bpm  Recent Labs: 09/19/2015: ALT 18 11/15/2015: TSH 0.84 02/25/2016: B Natriuretic Peptide 226.9 02/27/2016: Hemoglobin 14.6; Platelets 157 02/28/2016: Magnesium 2.4 03/12/2016: BUN 16; Creat 1.27; Potassium 3.9; Sodium 140   Lipid Panel    Component Value Date/Time   CHOL 110 05/29/2015 1116   TRIG 96.0 05/29/2015 1116   HDL 38.20 (L) 05/29/2015 1116   CHOLHDL 3 05/29/2015 1116   VLDL 19.2 05/29/2015 1116   LDLCALC 53 05/29/2015 1116    Additional studies/ records that were reviewed today include:   2-D echo 01/21/16 Study Conclusions   - Left ventricle: Systolic function was normal. The estimated   ejection fraction was in the range of 60% to 65%. Wall motion was   normal; there were no regional wall motion abnormalities. The   study is not technically sufficient to allow evaluation of LV   diastolic function. -  Aortic valve: Sclerosis without stenosis. There was trivial   regurgitation. - Aorta: Aortic root dimension: 39 mm (ED). Ascending aortic   diameter: 39 mm (S). - Aortic root:  The aortic root was top normal in size. - Ascending aorta: The ascending aorta was top normal in size. - Mitral valve: Mildly thickened leaflets . There was trivial   regurgitation. - Left atrium: The atrium was at the upper limits of normal in   size. - Right atrium: The atrium was at the upper limits of normal in   size. - Tricuspid valve: There was mild regurgitation. - Pulmonary arteries: PA peak pressure: 39 mm Hg (S). - Inferior vena cava: The vessel was dilated. The respirophasic   diameter changes were blunted (< 50%), consistent with elevated   central venous pressure.   Impressions:   - Compared to a prior echo in 2013, the LVEF is higher at 60-65%,   atrial fib/flutter is present, the aorta is top normal in size at   3.9 cm, there borderline biatrial enlargement, mild TR, RVSP 39   mmHg, dilated IVC, trivial AI  Cardiac catheterization 07/10/15 Conclusion  1. Normal LV systolic function with normal LVEDP 2. Severe distal left circumflex stenosis - recommend medical therapy as small amount of myocardium supplied 3. Mild nonobstructive stenosis of a large, wrap-around LAD 4. Total occlusion of a small, codominant RCA   Recommend medical therapy, risk reduction measures. I don't think PCI will significantly impact symptoms or overall clinical outcome.      ASSESSMENT:    1. PAF (paroxysmal atrial fibrillation) ([place])   2. Acute on chronic diastolic (congestive) heart failure ([place])   3. Essential hypertension   4. Coronary artery disease due to lipid rich plaque   5. Smoker      PLAN:  In order of problems listed above:  PAF with atrial flutter still with fast rates. Patient had run out of his diltiazem and it was resumed on 04/07/16 at 180 mg daily. He does fill the 90 day supply of this and does not want to get a new prescription. Therefore will increase metoprolol 150 mg in the morning 100 mg in the evening. Follow-up with me next week  Acute on chronic  diastolic heart failure. Patient has lost 5 pounds but still has significant amount of edema. Will increase Lasix to 80 mg twice a day and see him back next week. We'll also check renal function and at potassium 20 mEq twice a day. 2 g sodium diet reiterated.  Hypertension controlled  CAD stable without angina medical therapy  Smoker patient not willing to quit at this time.   Medication Adjustments/Labs and Tests Ordered: Current medicines are reviewed at length with the patient today.  Concerns regarding medicines are outlined above.  Medication changes, Labs and Tests ordered today are listed in the Patient Instructions below. Patient Instructions  Medication Instructions:  Your physician has recommended you make the following change in your medication:  INCREASE  FUROSEMIDE  TO  40 MG  2 TABS  TWICE  DAILY  AND   START  KCL   20 MEQ  TWICE  DAILY  Labwork: BMET TODAY   Testing/Procedures: NONE  Follow-Up: Your physician recommends that you schedule a follow-up appointment in:  NEXT WEEK WITH  Spenser Harren  TOC  SLOT   AT 12;15  PER Albina Gosney  Any Other Special Instructions Will Be Listed Below (If Applicable).     If you need a refill on your cardiac  medications before your next appointment, please call your pharmacy.      Sumner Boast, PA-C  04/22/2016 11:18 AM    [facility] [address], [place], [place]  [address] Phone: [phone]; Fax: [phone]

## 2016-04-23 NOTE — Procedures (Signed)
   Patient Name: Neil Carroll, Neil Carroll Date: 04/05/2016 Gender: Male D.O.B: 1951/08/08 Age (years): 24 Referring Provider: Will Camnitz Height (inches): 71 Interpreting Physician: Fransico Him MD, ABSM Weight (lbs): 269 RPSGT: Joni Reining BMI: 38 MRN: [id number] Neck Size: 18.00  CLINICAL INFORMATION Sleep Study Type: NPSG Indication for sleep study: N/A Epworth Sleepiness Score: 14  SLEEP STUDY TECHNIQUE As per the AASM Manual for the Scoring of Sleep and Associated Events v2.3 (April 2016) with a hypopnea requiring 4% desaturations. The channels recorded and monitored were frontal, central and occipital EEG, electrooculogram (EOG), submentalis EMG (chin), nasal and oral airflow, thoracic and abdominal wall motion, anterior tibialis EMG, snore microphone, electrocardiogram, and pulse oximetry.  MEDICATIONS Patient's medications include: Reviewed in the chart. Medications self-administered by patient during sleep study : No sleep medicine administered.  SLEEP ARCHITECTURE The study was initiated at 10:39:44 PM and ended at 12:45:36 AM. Sleep onset time was 0.0 minutes and the sleep efficiency was 44.1%. The total sleep time was markedly reduced at 55.6 minutes. Stage REM latency was N/A minutes. The patient spent 27.90% of the night in stage N1 sleep, 72.10% in stage N2 sleep, 0.00% in stage N3 and 0.00% in REM. Alpha intrusion was absent. Supine sleep was 0.00%.  RESPIRATORY PARAMETERS The overall apnea/hypopnea index (AHI) was 9.7 per hour. There were 0 total apneas, including 0 obstructive, 0 central and 0 mixed apneas. There were 9 hypopneas and 8 RERAs. The AHI during Stage REM sleep was N/A per hour. AHI while supine was N/A per hour. The mean oxygen saturation was 82.58%. The minimum SpO2 during sleep was 76.00%. Loud snoring was noted during this study.  CARDIAC DATA The 2 lead EKG demonstrated atrial fibrillation. The mean heart rate was 102.49 beats per  minute.  LEG MOVEMENT DATA The total PLMS were 68 with a resulting PLMS index of 73.43. Associated arousal with leg movement index was 11.9 .  IMPRESSIONS - Mild obstructive sleep apnea occurred during this study (AHI = 9.7/h) . - No significant central sleep apnea occurred during this study (CAI = 0.0/h). - Moderate oxygen desaturation was noted during this study (Min O2 = 76.00%). - The patient snored with Loud snoring volume. - No cardiac abnormalities were noted during this study. - Severe periodic limb movements of sleep occurred during the study. Associated arousals were significant.  DIAGNOSIS - Obstructive Sleep Apnea (327.23 [G47.33 ICD-10]) - Nocturnal Hypoxemia (327.26 [G47.36 ICD-10])  RECOMMENDATIONS - Therapeutic CPAP titration to determine optimal pressure required to alleviate sleep disordered breathing. - Avoid alcohol, sedatives and other CNS depressants that may worsen sleep apnea and disrupt normal sleep architecture. - Sleep hygiene should be reviewed to assess factors that may improve sleep quality. - Weight management and regular exercise should be initiated or continued if appropriate.   [place], American Board of Sleep Medicine  ELECTRONICALLY SIGNED ON:  04/23/2016, 4:06 PM [place] PH: [phone]   FX: [phone] [place]

## 2016-04-24 ENCOUNTER — Encounter: Payer: Self-pay | Admitting: Physician Assistant

## 2016-04-24 DIAGNOSIS — E1351 Other specified diabetes mellitus with diabetic peripheral angiopathy without gangrene: Secondary | ICD-10-CM | POA: Diagnosis not present

## 2016-04-24 DIAGNOSIS — L602 Onychogryphosis: Secondary | ICD-10-CM | POA: Diagnosis not present

## 2016-04-24 DIAGNOSIS — L84 Corns and callosities: Secondary | ICD-10-CM | POA: Diagnosis not present

## 2016-04-24 DIAGNOSIS — I70293 Other atherosclerosis of native arteries of extremities, bilateral legs: Secondary | ICD-10-CM | POA: Diagnosis not present

## 2016-04-27 ENCOUNTER — Other Ambulatory Visit (INDEPENDENT_AMBULATORY_CARE_PROVIDER_SITE_OTHER): Payer: Medicare Other

## 2016-04-27 DIAGNOSIS — E1165 Type 2 diabetes mellitus with hyperglycemia: Secondary | ICD-10-CM

## 2016-04-27 DIAGNOSIS — Z794 Long term (current) use of insulin: Secondary | ICD-10-CM

## 2016-04-27 LAB — COMPREHENSIVE METABOLIC PANEL
ALBUMIN: 3.9 g/dL (ref 3.5–5.2)
ALT: 14 U/L (ref 0–53)
AST: 13 U/L (ref 0–37)
Alkaline Phosphatase: 49 U/L (ref 39–117)
BILIRUBIN TOTAL: 0.4 mg/dL (ref 0.2–1.2)
BUN: 12 mg/dL (ref 6–23)
CALCIUM: 9.4 mg/dL (ref 8.4–10.5)
CHLORIDE: 105 meq/L (ref 96–112)
CO2: 27 meq/L (ref 19–32)
CREATININE: 1.26 mg/dL (ref 0.40–1.50)
GFR: 73.63 mL/min (ref >60.00)
Glucose, Bld: 106 mg/dL — ABNORMAL HIGH (ref 70–99)
Potassium: 4.2 mEq/L (ref 3.5–5.1)
SODIUM: 139 meq/L (ref 135–145)
Total Protein: 7.4 g/dL (ref 6.0–8.3)

## 2016-04-27 LAB — MICROALBUMIN / CREATININE URINE RATIO
Creatinine,U: 307 mg/dL
Microalb Creat Ratio: 1.1 mg/g (ref 0.0–30.0)
Microalb, Ur: 3.5 mg/dL — ABNORMAL HIGH (ref 0.0–1.9)

## 2016-04-27 LAB — HEMOGLOBIN A1C: Hgb A1c MFr Bld: 7 % — ABNORMAL HIGH (ref 4.6–6.5)

## 2016-04-28 ENCOUNTER — Other Ambulatory Visit: Payer: Self-pay

## 2016-04-28 ENCOUNTER — Ambulatory Visit (INDEPENDENT_AMBULATORY_CARE_PROVIDER_SITE_OTHER): Payer: Medicare Other | Admitting: Physician Assistant

## 2016-04-28 ENCOUNTER — Encounter: Payer: Self-pay | Admitting: Physician Assistant

## 2016-04-28 VITALS — BP 112/68 | HR 88 | Ht 71.0 in | Wt 265.4 lb

## 2016-04-28 DIAGNOSIS — I5033 Acute on chronic diastolic (congestive) heart failure: Secondary | ICD-10-CM | POA: Diagnosis not present

## 2016-04-28 DIAGNOSIS — I481 Persistent atrial fibrillation: Secondary | ICD-10-CM | POA: Diagnosis not present

## 2016-04-28 DIAGNOSIS — G4733 Obstructive sleep apnea (adult) (pediatric): Secondary | ICD-10-CM

## 2016-04-28 DIAGNOSIS — Z720 Tobacco use: Secondary | ICD-10-CM | POA: Diagnosis not present

## 2016-04-28 DIAGNOSIS — I4819 Other persistent atrial fibrillation: Secondary | ICD-10-CM

## 2016-04-28 DIAGNOSIS — I10 Essential (primary) hypertension: Secondary | ICD-10-CM

## 2016-04-28 DIAGNOSIS — I208 Other forms of angina pectoris: Secondary | ICD-10-CM

## 2016-04-28 DIAGNOSIS — F17200 Nicotine dependence, unspecified, uncomplicated: Secondary | ICD-10-CM

## 2016-04-28 DIAGNOSIS — I2583 Coronary atherosclerosis due to lipid rich plaque: Secondary | ICD-10-CM

## 2016-04-28 DIAGNOSIS — I2510 Atherosclerotic heart disease of native coronary artery without angina pectoris: Secondary | ICD-10-CM

## 2016-04-28 NOTE — Patient Instructions (Signed)
Medication Instructions:  Your physician recommends that you continue on your current medications as directed. Please refer to the Current Medication list given to you today.   Labwork: None ordered  Testing/Procedures: None ordered  Follow-Up: Your physician recommends that you schedule a follow-up appointment in: 2-3 [place]   Any Other Special Instructions Will Be Listed Below (If Applicable).     If you need a refill on your cardiac medications before your next appointment, please call your pharmacy.

## 2016-04-28 NOTE — Progress Notes (Signed)
Cardiology Office Note    Date:  04/28/2016   ID:  Neil Carroll., DOB 10-28-1950, MRN [id number]  PCP:  Hoyt Koch, MD  Cardiologist: Dr. Burt Knack EPS: Dr. Curt Bears Sleep apnea: Dr. Radford Pax   Chief Complaint  Patient presents with  . Follow-up    History of Present Illness:  Neil Carroll. is a 65 y.o. male has a history of atrial flutter status post redo frequency ablation, peripheral arterial disease with known SFA occlusive disease bilaterally, HTN, DM, COPD, tobacco use and CAD. He recently underwent a [place] by Dr. Burt Knack 06/2015. This showed severe distal left circumflex stenosis - recommend medical therapy as small amount of myocardium supplied, mild nonobstructive stenosis of a large, wrap around LAD and total occlusion of a small, codominant RCA. Dr. Burt Knack did not feel that PCI would significantly impact symptoms or overall clinical outcome, thus medical therapy was elected. He has been maintained on Plavix, metoprolol, Crestor and Losartan. He also takes Pletal for his PVD. Was diagnosed with atrial flutter after an ER visit and atrial fibrillation after event monitor.  He presented to the hospital the beginning of June with a heart failure exacerbation. He was diuresed and discharged a weight of 258 pounds.  Patient saw Dr. Curt Bears 04/07/16 for both atrial fibrillation and what appears to be atypical atrial flutter over the past few months. He was started on rivaroxaban for CHADSVASC of 3. And diltiazem was renewed as his heart rate was fast. He had diastolic heart failure with quite a bit of edema and significant weight gain since his recent hospital stay. He was placed on Lasix 80 mg a day for a week to see if this helped. Rate control should also help.  I saw the patient last week and he was still up 13 pounds. He had cut back on salt but was still getting [employer] sausage etc. Heart rate was 109. I increased his metoprolol 250 mg in the morning 100 mg  in the evening. I also continued his Lasix 80 mg twice a day. Labs were checked yesterday and creatinine remained stable at 1.26.  Patient comes in today feeling better. He has cut back on his salt. His weight is down another 6 pounds. He still has some edema but overall feels good. Heart rate is also down to 88. He was contacted today about a CPap machine for his sleep apnea.       Past Medical History:  Diagnosis Date  . Adenomatous colon polyp   . Arthritis    left hip replacement  . Atrial flutter ([place])    onset  2011. s/p EPS/RFA 12/2011  . Cataract   . Chronic bronchitis   . Contact dermatitis and other eczema due to plants (except food)   . COPD (chronic obstructive pulmonary disease) ([place])   . GERD (gastroesophageal reflux disease)   . Hyperlipidemia   . Hypertension   . LV dysfunction    EF 45-50% 12/2011  . Other peripheral vascular disease(443.89)    bilateral lower extremity  . Tobacco use disorder    dependent  . Transient global amnesia   . Tuberculosis   . Type II diabetes mellitus ([place])     Past Surgical History:  Procedure Laterality Date  . A FLUTTER ABLATION N/A 12/28/2011   Procedure: ABLATION A FLUTTER;  Surgeon: Evans Lance, MD;  Location: [facility] CATH LAB;  Service: Cardiovascular;  Laterality: N/A;  . CARDIAC CATHETERIZATION N/A 07/10/2015   Procedure: Left  Heart Cath and Coronary Angiography;  Surgeon: Sherren Mocha, MD;  Location: [place] CV LAB;  Service: Cardiovascular;  Laterality: N/A;  . CARDIAC ELECTROPHYSIOLOGY MAPPING AND ABLATION  03/2010  . COLONOSCOPY    . colonoscopy with polypectomy  2006 & 2011   Dr [name]  . CYSTOSCOPY  11/2007   Dr Jeffie Pollock  . PARTIAL HIP ARTHROPLASTY Right 01/2000   "Right; replaced ball & stem"  . PARTIAL HIP ARTHROPLASTY Left   . POLYPECTOMY    . TEE WITHOUT CARDIOVERSION  12/28/2011   Procedure: TRANSESOPHAGEAL ECHOCARDIOGRAM (TEE);  Surgeon: Larey Dresser, MD;  [facility] ENDOSCOPY;  Service:  Cardiovascular;  Laterality: N/A;    Current Medications: Outpatient Medications Prior to Visit  Medication Sig Dispense Refill  . acetaminophen (TYLENOL) 650 MG CR tablet Take 650 mg by mouth every 8 (eight) hours as needed for pain.    . cilostazol (PLETAL) 100 MG tablet Take 1 tablet (100 mg total) by mouth 2 (two) times daily. 180 tablet 1  . clobetasol (OLUX) 0.05 % topical foam Apply topically 2 (two) times daily.   0  . collagenase (SANTYL) ointment Apply 1 application topically daily.    [name] diltiazem (CARDIZEM CD) 180 MG 24 hr capsule Take 1 capsule (180 mg total) by mouth daily. 90 capsule 1  . etanercept (ENBREL SURECLICK) 50 MG/ML injection Inject 50 mg into the skin 2 (two) times a week. Inject on Sundays & Wednesdays    . Flaxseed, Linseed, (FLAX SEED OIL) 1000 MG CAPS Take 1 capsule by mouth 2 (two) times daily.     . Fluticasone-Salmeterol (ADVAIR) 100-50 MCG/DOSE AEPB Inhale 1 puff into the lungs 2 (two) times daily. 180 each 1  . furosemide (LASIX) 40 MG tablet Take 2 tablets (80 mg total) by mouth 2 (two) times daily. 120 tablet 11  . LEVEMIR FLEXTOUCH 100 UNIT/ML Pen INJECT 24 UNITS            SUBCUTANEOUSLY DAILY 30 mL 1  . Liraglutide (VICTOZA) 18 MG/3ML SOPN INJECT 0.3ML (=1.'8MG'$ )      SUBCUTANEOUSLY DAILY 27 mL 1  . losartan (COZAAR) 100 MG tablet Take 100 mg by mouth daily.    . metformin (FORTAMET) 1000 MG (OSM) 24 hr tablet Take 1,000 mg by mouth 2 (two) times daily with a meal.    . Multiple Vitamins-Minerals (CENTRUM ADULTS) TABS Take 1 tablet by mouth daily.    . mupirocin ointment (BACTROBAN) 2 % Apply 1 application topically 2 (two) times daily as needed (affected areas).     . NOVOFINE 32G X 6 MM MISC USE 2 DAILY WITH LEVEMIR AND VICTOZA 100 each 5  . Omega-3 Fatty Acids (FISH OIL) 1000 MG CAPS Take 2 capsules by mouth 2 (two) times daily.     . potassium chloride SA (K-DUR,KLOR-CON) 20 MEQ tablet Take 1 tablet (20 mEq total) by mouth 2 (two) times daily. 60  tablet 11  . rivaroxaban (XARELTO) 20 MG TABS tablet Take 20 mg by mouth daily with supper.    . rosuvastatin (CRESTOR) 10 MG tablet Take 1 tablet (10 mg total) by mouth daily. 90 tablet 3  . tamsulosin (FLOMAX) 0.4 MG CAPS capsule Take 0.4 mg by mouth daily.     [name] triamcinolone cream (KENALOG) 0.1 % Apply 1 application topically 2 (two) times daily as needed (affected area).     . metoprolol (LOPRESSOR) 100 MG tablet take 1 tablet by mouth twice a day 180 tablet 3  . collagenase (SANTYL)  ointment Apply topically daily. 15 g 0   No facility-administered medications prior to visit.      Allergies:   Niacin   Social History   Social History  . Marital status: Widowed    Spouse name: N/A  . Number of children: 5  . Years of education: N/A   Occupational History  . security guard    Social History Main Topics  . Smoking status: Current Every Day Smoker    Packs/day: 1.00    Years: 44.00    Types: Cigarettes  . Smokeless tobacco: Never Used     Comment: started @ age 61, up to 2 ppd;1.25-1.5 ppd as of 11/23/13  . Alcohol use 0.0 oz/week     Comment:  11/23/13 "2-3 drinks per year"  . Drug use: No  . Sexual activity: Not Currently   Other Topics Concern  . None   Social History Narrative  . None     Family History:  The patient's family history includes Crohn's disease in his son; Diabetes in his brother, brother, mother, and paternal grandmother; Heart attack (age of onset: 59) in his brother; Hypertension in his mother; Lung cancer in his father; Stroke (age of onset: 21) in his mother; Throat cancer in his paternal uncle.   ROS:   Please see the history of present illness.    Review of Systems  Cardiovascular: Positive for dyspnea on exertion and leg swelling.  Respiratory: Positive for cough.   Skin: Positive for rash.   All other systems reviewed and are negative.   PHYSICAL EXAM:   VS:  BP 112/68   Pulse 88   Ht '5\' 11"'$  (1.803 m)   Wt 265 lb 6.4 oz (120.4 kg)    BMI 37.02 kg/m   Physical Exam  GEN: Well nourished, well developed, in no acute distress  Neck: no JVD, carotid bruits, or masses Cardiac:Irregular irregular 2/6 systolic murmur at the left sternal border, no rubs, or gallops  Respiratory:  Decreased breath sounds with scattered wheezes and rhonchi  GI: soft, nontender, nondistended, + BS Ext: Cyanotic with 1+ edema bilaterally up to his knees MS: no deformity or atrophy  Skin: warm and dry, no rash Psych: euthymic mood, full affect  Wt Readings from Last 3 Encounters:  04/28/16 265 lb 6.4 oz (120.4 kg)  04/22/16 271 lb 12.8 oz (123.3 kg)  04/09/16 274 lb 12.8 oz (124.6 kg)      Studies/Labs Reviewed:   EKG:  EKG is  ordered today.  The ekg ordered today demonstrates Atrial fibrillation at 88 bpm nonspecific ST-T wave changes  Recent Labs: 11/15/2015: TSH 0.84 02/25/2016: B Natriuretic Peptide 226.9 02/27/2016: Hemoglobin 14.6; Platelets 157 02/28/2016: Magnesium 2.4 04/27/2016: ALT 14; BUN 12; Creatinine, Ser 1.26; Potassium 4.2; Sodium 139   Lipid Panel    Component Value Date/Time   CHOL 110 05/29/2015 1116   TRIG 96.0 05/29/2015 1116   HDL 38.20 (L) 05/29/2015 1116   CHOLHDL 3 05/29/2015 1116   VLDL 19.2 05/29/2015 1116   LDLCALC 53 05/29/2015 1116    Additional studies/ records that were reviewed today include:   2-D echo 01/21/16 Study Conclusions   - Left ventricle: Systolic function was normal. The estimated   ejection fraction was in the range of 60% to 65%. Wall motion was   normal; there were no regional wall motion abnormalities. The   study is not technically sufficient to allow evaluation of LV   diastolic function. - Aortic valve: Sclerosis without stenosis.  There was trivial   regurgitation. - Aorta: Aortic root dimension: 39 mm (ED). Ascending aortic   diameter: 39 mm (S). - Aortic root: The aortic root was top normal in size. - Ascending aorta: The ascending aorta was top normal in size. - Mitral  valve: Mildly thickened leaflets . There was trivial   regurgitation. - Left atrium: The atrium was at the upper limits of normal in   size. - Right atrium: The atrium was at the upper limits of normal in   size. - Tricuspid valve: There was mild regurgitation. - Pulmonary arteries: PA peak pressure: 39 mm Hg (S). - Inferior vena cava: The vessel was dilated. The respirophasic   diameter changes were blunted (< 50%), consistent with elevated   central venous pressure.   Impressions:   - Compared to a prior echo in 2013, the LVEF is higher at 60-65%,   atrial fib/flutter is present, the aorta is top normal in size at   3.9 cm, there borderline biatrial enlargement, mild TR, RVSP 39   mmHg, dilated IVC, trivial AI   Cardiac catheterization 07/10/15 Conclusion  1. Normal LV systolic function with normal LVEDP 2. Severe distal left circumflex stenosis - recommend medical therapy as small amount of myocardium supplied 3. Mild nonobstructive stenosis of a large, wrap-around LAD 4. Total occlusion of a small, codominant RCA   Recommend medical therapy, risk reduction measures. I don't think PCI will significantly impact symptoms or overall clinical outcome.          ASSESSMENT:    1. Acute on chronic diastolic (congestive) heart failure (HCC)   2. Persistent atrial fibrillation ([place])   3. Smoker   4. Essential hypertension   5. Coronary artery disease due to lipid rich plaque   6. OSA (obstructive sleep apnea)      PLAN:  In order of problems listed above: Acute on chronic diastolic heart failure patient has lost another 6 pounds on current dose of Lasix 80 mg twice a day. Creatinine was stable yesterday. We'll continue this dose. 2 g sodium diet. F/U with Dr. Burt Knack in 2 months.  Persistent atrial fibrillation now with rate control since increasing metoprolol. Continue this dose. Patient tolerating well.  Smoker patient unwilling to quit  Essential hypertension  controlled   CAD without angina  Obstructive sleep apnea recently diagnosed with sleep study to be fitted for CPAP        Medication Adjustments/Labs and Tests Ordered: Current medicines are reviewed at length with the patient today.  Concerns regarding medicines are outlined above.  Medication changes, Labs and Tests ordered today are listed in the Patient Instructions below. Patient Instructions  Medication Instructions:  Your physician recommends that you continue on your current medications as directed. Please refer to the Current Medication list given to you today.   Labwork: None ordered  Testing/Procedures: None ordered  Follow-Up: Your physician recommends that you schedule a follow-up appointment in: 2-3 [place]   Any Other Special Instructions Will Be Listed Below (If Applicable).     If you need a refill on your cardiac medications before your next appointment, please call your pharmacy.      Sumner Boast, PA-C  04/28/2016 11:41 AM    [facility] [address], [place], [place]  [address] Phone: [phone]; Fax: [phone]

## 2016-04-28 NOTE — Progress Notes (Signed)
Spoke with patient and advised of sleep study results and Dr.Turners recommendations. Patient verbalized understanding and had no questions or concerns. Will schedule in lab titration and mail letter out.

## 2016-04-29 ENCOUNTER — Encounter: Payer: Self-pay | Admitting: Endocrinology

## 2016-04-29 ENCOUNTER — Ambulatory Visit (INDEPENDENT_AMBULATORY_CARE_PROVIDER_SITE_OTHER): Payer: Medicare Other | Admitting: Endocrinology

## 2016-04-29 VITALS — BP 124/64 | HR 94 | Ht 71.0 in | Wt 266.0 lb

## 2016-04-29 DIAGNOSIS — E1169 Type 2 diabetes mellitus with other specified complication: Secondary | ICD-10-CM

## 2016-04-29 DIAGNOSIS — E119 Type 2 diabetes mellitus without complications: Secondary | ICD-10-CM

## 2016-04-29 DIAGNOSIS — E669 Obesity, unspecified: Secondary | ICD-10-CM | POA: Diagnosis not present

## 2016-04-29 DIAGNOSIS — I208 Other forms of angina pectoris: Secondary | ICD-10-CM

## 2016-04-29 NOTE — Patient Instructions (Addendum)
Check blood sugars on waking up 3 x per week   Also check blood sugars about 2 hours after a meal and do this after different meals by rotation  Recommended blood sugar levels on waking up is 90-130 and about 2 hours after meal is 130-160  Please bring your blood sugar monitor to each visit, thank you  Walk daily

## 2016-04-29 NOTE — Progress Notes (Signed)
Patient ID: Neil Carroll., male   DOB: Aug 07, 1951, 65 y.o.   MRN: [id number]   Reason for Appointment : Followup for Type 2 Diabetes  History of Present Illness          Diagnosis: Type 2 diabetes mellitus, date of diagnosis:  2008     Past history: He has been treated with metformin only for several years and had relatively good control until about a year ago. However had been taking only 1000 mg of metformin. Because of his progressively higher blood sugars he had been started on Levemir insulin since 6/14.  However with this his A1c was still 10.2 in 9/14 and he was referred here With increasing Victoza to 1.8 mg in 3/15 his weight had come down and his blood sugars had improved   Recent history:   INSULIN regimen is described as:  Levemir 24 units hs  His A1c is about the same at 7 although has been as low as 6.7 and 2016  Current management, blood sugar patterns and problems identified:  He has had only a few blood sugars on his monitor, mostly in the afternoons before supper  Has been very compliant with taking his insulin and Victoza close to bedtime every night  He has not been able to do any exercise because of various medical issues and dyspnea  He thinks his diet is fairly good usually  Fasting readings are around 120, 106 in the lab He is still taking Victoza 1.8 mg without side effects.   He takes Victoza in the late afternoon with his LEVEMIR.    Oral hypoglycemic drugs the patient is taking are: Metformin 2g Side effects from medications have been: None Compliance with the medical regimen: Fair   Glucose monitoring: Irregular        Glucometer: One Touch.      Blood Glucose readings   Mean values apply above for all meters except median for One Touch  PRE-MEAL Fasting Lunch Dinner Bedtime Overall  Glucose range: 120, 127   97-130  144, 116    Mean/median:     118    Hypoglycemia frequency: Never.           Self-care:   Meals:  2/day. 11 am 6 pm.  He is eating out rarely Physical activity: exercise: none         Dietician visit: Most recent: 09/2013                  Wt Readings from Last 3 Encounters:  04/29/16 266 lb (120.7 kg)  04/28/16 265 lb 6.4 oz (120.4 kg)  04/22/16 271 lb 12.8 oz (123.3 kg)       Lab Results  Component Value Date   HGBA1C 7.0 (H) 04/27/2016   HGBA1C 7.1 (H) 01/21/2016   HGBA1C 7.0 (H) 11/10/2015   Lab Results  Component Value Date   MICROALBUR 3.5 (H) 04/27/2016   LDLCALC 53 05/29/2015   CREATININE 1.26 04/27/2016   Appointment on 04/27/2016  Component Date Value Ref Range Status  . Hgb A1c MFr Bld 04/27/2016 7.0* 4.6 - 6.5 % Final  . Sodium 04/27/2016 139  135 - 145 mEq/L Final  . Potassium 04/27/2016 4.2  3.5 - 5.1 mEq/L Final  . Chloride 04/27/2016 105  96 - 112 mEq/L Final  . CO2 04/27/2016 27  19 - 32 mEq/L Final  . Glucose, Bld 04/27/2016 106* 70 - 99 mg/dL Final  .  BUN 04/27/2016 12  6 - 23 mg/dL Final  . Creatinine, Ser 04/27/2016 1.26  0.40 - 1.50 mg/dL Final  . Total Bilirubin 04/27/2016 0.4  0.2 - 1.2 mg/dL Final  . Alkaline Phosphatase 04/27/2016 49  39 - 117 U/L Final  . AST 04/27/2016 13  0 - 37 U/L Final  . ALT 04/27/2016 14  0 - 53 U/L Final  . Total Protein 04/27/2016 7.4  6.0 - 8.3 g/dL Final  . Albumin 04/27/2016 3.9  3.5 - 5.2 g/dL Final  . Calcium 04/27/2016 9.4  8.4 - 10.5 mg/dL Final  . GFR 04/27/2016 73.63  >60.00 mL/min Final  . Microalb, Ur 04/27/2016 3.5* 0.0 - 1.9 mg/dL Final  . Creatinine,U 04/27/2016 307.0  mg/dL Final  . Microalb Creat Ratio 04/27/2016 1.1  0.0 - 30.0 mg/g Final       Medication List       Accurate as of 04/29/16  2:09 PM. Always use your most recent med list.          acetaminophen 650 MG CR tablet Commonly known as:  TYLENOL Take 650 mg by mouth every 8 (eight) hours as needed for pain.   CENTRUM ADULTS Tabs Take 1 tablet by mouth daily.   cilostazol 100 MG tablet Commonly known as:  PLETAL Take 1  tablet (100 mg total) by mouth 2 (two) times daily.   clobetasol 0.05 % topical foam Commonly known as:  OLUX Apply topically 2 (two) times daily.   collagenase ointment Commonly known as:  SANTYL Apply 1 application topically daily.   diltiazem 180 MG 24 hr capsule Commonly known as:  CARDIZEM CD Take 1 capsule (180 mg total) by mouth daily.   ENBREL SURECLICK 50 MG/ML injection Generic drug:  etanercept Inject 50 mg into the skin 2 (two) times a week. Inject on Sundays & Wednesdays   Fish Oil 1000 MG Caps Take 2 capsules by mouth 2 (two) times daily.   Flax Seed Oil 1000 MG Caps Take 1 capsule by mouth 2 (two) times daily.   Fluticasone-Salmeterol 100-50 MCG/DOSE Aepb Commonly known as:  ADVAIR Inhale 1 puff into the lungs 2 (two) times daily.   furosemide 40 MG tablet Commonly known as:  LASIX Take 2 tablets (80 mg total) by mouth 2 (two) times daily.   LEVEMIR FLEXTOUCH 100 UNIT/ML Pen Generic drug:  Insulin Detemir INJECT 24 UNITS            SUBCUTANEOUSLY DAILY   Liraglutide 18 MG/3ML Sopn Commonly known as:  VICTOZA INJECT 0.3ML (=1.'8MG'$ )      SUBCUTANEOUSLY DAILY   losartan 100 MG tablet Commonly known as:  COZAAR Take 100 mg by mouth daily.   metformin 1000 MG (OSM) 24 hr tablet Commonly known as:  FORTAMET Take 1,000 mg by mouth 2 (two) times daily with a meal.   metoprolol 100 MG tablet Commonly known as:  LOPRESSOR Take 100 mg by mouth 2 (two) times daily. also takes one half tab 50 mg along with regular dose of '100mg'$  twice daily   mupirocin ointment 2 % Commonly known as:  BACTROBAN Apply 1 application topically 2 (two) times daily as needed (affected areas).   NOVOFINE 32G X 6 MM Misc Generic drug:  Insulin Pen Needle USE 2 DAILY WITH LEVEMIR AND VICTOZA   potassium chloride SA 20 MEQ tablet Commonly known as:  K-DUR,KLOR-CON Take 1 tablet (20 mEq total) by mouth 2 (two) times daily.   rivaroxaban 20 MG Tabs tablet Commonly  known as:   XARELTO Take 20 mg by mouth daily with supper.   rosuvastatin 10 MG tablet Commonly known as:  CRESTOR Take 1 tablet (10 mg total) by mouth daily.   tamsulosin 0.4 MG Caps capsule Commonly known as:  FLOMAX Take 0.4 mg by mouth daily.   triamcinolone cream 0.1 % Commonly known as:  KENALOG Apply 1 application topically 2 (two) times daily as needed (affected area).       Allergies:  Allergies  Allergen Reactions  . Niacin Nausea Only    REACTION: upset stomach    Past Medical History:  Diagnosis Date  . Adenomatous colon polyp   . Arthritis    left hip replacement  . Atrial flutter ([place])    onset  2011. s/p EPS/RFA 12/2011  . Cataract   . Chronic bronchitis   . Contact dermatitis and other eczema due to plants (except food)   . COPD (chronic obstructive pulmonary disease) ([place])   . GERD (gastroesophageal reflux disease)   . Hyperlipidemia   . Hypertension   . LV dysfunction    EF 45-50% 12/2011  . Other peripheral vascular disease(443.89)    bilateral lower extremity  . Tobacco use disorder    dependent  . Transient global amnesia   . Tuberculosis   . Type II diabetes mellitus ([place])     Past Surgical History:  Procedure Laterality Date  . A FLUTTER ABLATION N/A 12/28/2011   Procedure: ABLATION A FLUTTER;  Surgeon: Evans Lance, MD;  Location: [facility] CATH LAB;  Service: Cardiovascular;  Laterality: N/A;  . CARDIAC CATHETERIZATION N/A 07/10/2015   Procedure: Left Heart Cath and Coronary Angiography;  Surgeon: Sherren Mocha, MD;  Location: [place] CV LAB;  Service: Cardiovascular;  Laterality: N/A;  . CARDIAC ELECTROPHYSIOLOGY MAPPING AND ABLATION  03/2010  . COLONOSCOPY    . colonoscopy with polypectomy  2006 & 2011   Dr [name]  . CYSTOSCOPY  11/2007   Dr Jeffie Pollock  . PARTIAL HIP ARTHROPLASTY Right 01/2000   "Right; replaced ball & stem"  . PARTIAL HIP ARTHROPLASTY Left   . POLYPECTOMY    . TEE WITHOUT CARDIOVERSION  12/28/2011   Procedure: TRANSESOPHAGEAL  ECHOCARDIOGRAM (TEE);  Surgeon: Larey Dresser, MD;  Location: [facility] ENDOSCOPY;  Service: Cardiovascular;  Laterality: N/A;    Family History  Problem Relation Age of Onset  . Stroke Mother 22  . Hypertension Mother   . Diabetes Mother   . Lung cancer Father     smoker  . Diabetes Paternal Grandmother   . Diabetes Brother     MI @ 43  . Diabetes Brother     Hepatitis C  . Throat cancer Paternal Uncle     1/2 uncle  . Heart attack Brother 66  . Colon cancer Neg Hx   . Esophageal cancer Neg Hx   . Rectal cancer Neg Hx   . Stomach cancer Neg Hx   . Crohn's disease Son     Social History:  reports that he has been smoking Cigarettes.  He has a 44.00 pack-year smoking history. He has never used smokeless tobacco. He reports that he drinks alcohol. He reports that he does not use drugs.    Review of Systems       Lipids: He has persistently low HDL, LDL well controlled with, taking Crestor 10 mg, He thinks that generic medication makes him swell   Lab Results  Component Value Date   CHOL 110 05/29/2015   HDL  38.20 (L) 05/29/2015   LDLCALC 53 05/29/2015   TRIG 96.0 05/29/2015   CHOLHDL 3 05/29/2015              Has occasional  history of Numbness, tingling in the right first or second toes, no burning in feet, on B6 vitamins OTC Foot exam done in 10/15 Diabetic foot exam in 9/16 showed normal monofilament sensation in the toes and plantar surfaces, no skin lesions or ulcers on the feet and absent pedal pulses  Now taking large doses of Lasix for his CHF  Physical Examination:  BP 124/64   Pulse 94   Ht '5\' 11"'$  (1.803 m)   Wt 266 lb (120.7 kg)   SpO2 91%   BMI 37.10 kg/m       ASSESSMENT:  Diabetes type 2, uncontrolled    See history of present illness for detailed discussion of current diabetes management, blood sugar patterns and problems identified  Currently on maximum dose of Victoza and metformin along with basal insulin  His control is fair with A1c  7% He has difficulty losing weight because of inability to exercise much  Fasting blood sugars have not been checked much about our looking fairly good with his current regimen of Levemir 24 units  Postprandial readings are appearing to be controlled with Victoza all that he is checking not frequently  PLAN:   No change in insulin More readings after supper Start walking when able to Discussed glucose monitoring at various times Continue Victoza  He will discuss using generic Crestor with his PCP  Follow-up in 4 months     Patient Instructions  Check blood sugars on waking up 3 x per week   Also check blood sugars about 2 hours after a meal and do this after different meals by rotation  Recommended blood sugar levels on waking up is 90-130 and about 2 hours after meal is 130-160  Please bring your blood sugar monitor to each visit, thank you  Walk daily        Shailene Demonbreun 04/29/2016, 2:09 PM   Note: This office note was prepared with Dragon voice recognition system technology. Any transcriptional errors that result from this process are unintentional.

## 2016-05-01 NOTE — Addendum Note (Signed)
Addended by: Freada Bergeron on: 05/01/2016 02:43 PM   Modules accepted: Orders

## 2016-05-08 IMAGING — CR DG CHEST 2V
2 series · 2 of 2 positions shown · non-contrast
Comparison: 11/10/2015

CLINICAL DATA: Hypoxia, smoker

EXAM:
CHEST  2 VIEW

[chest pa]
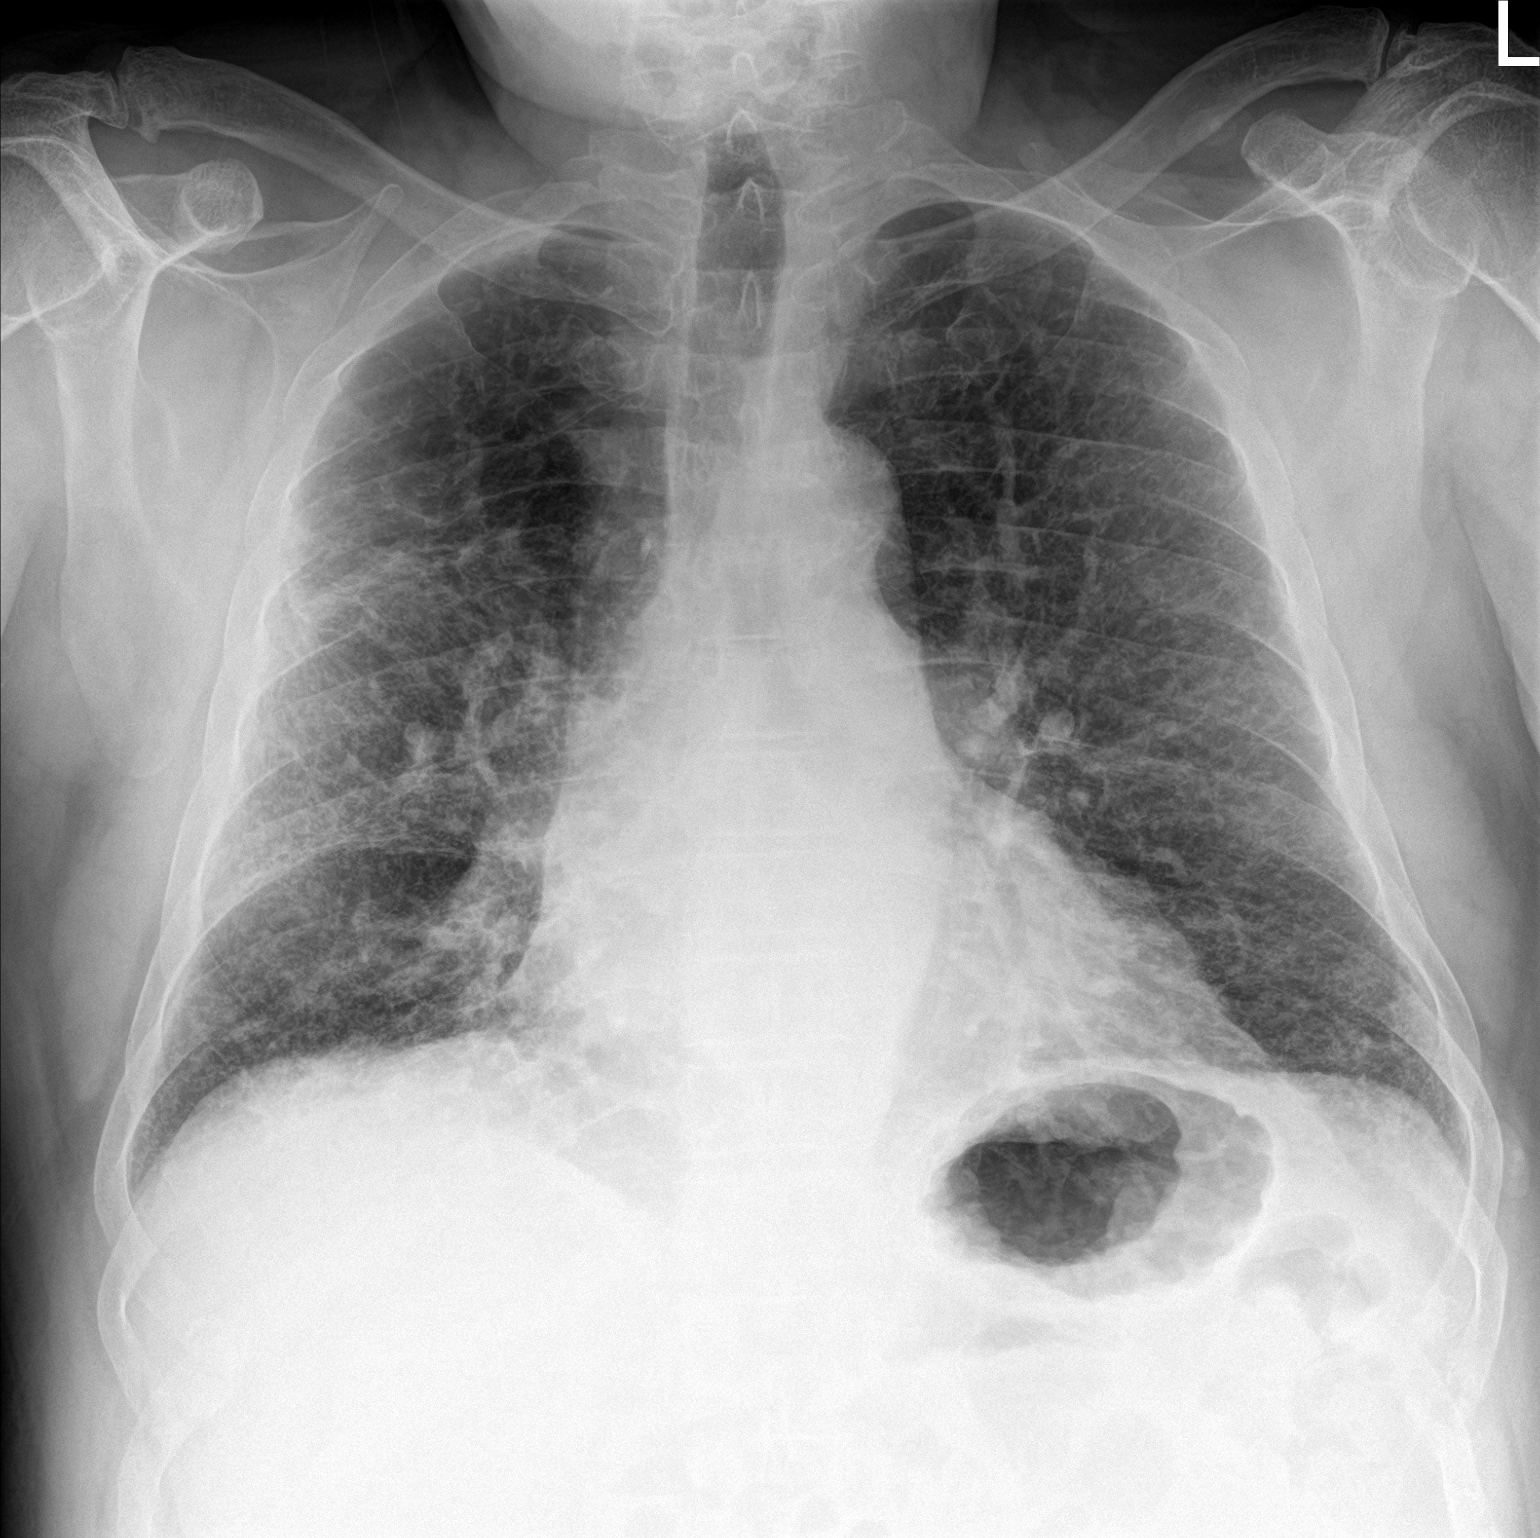

[chest lat]
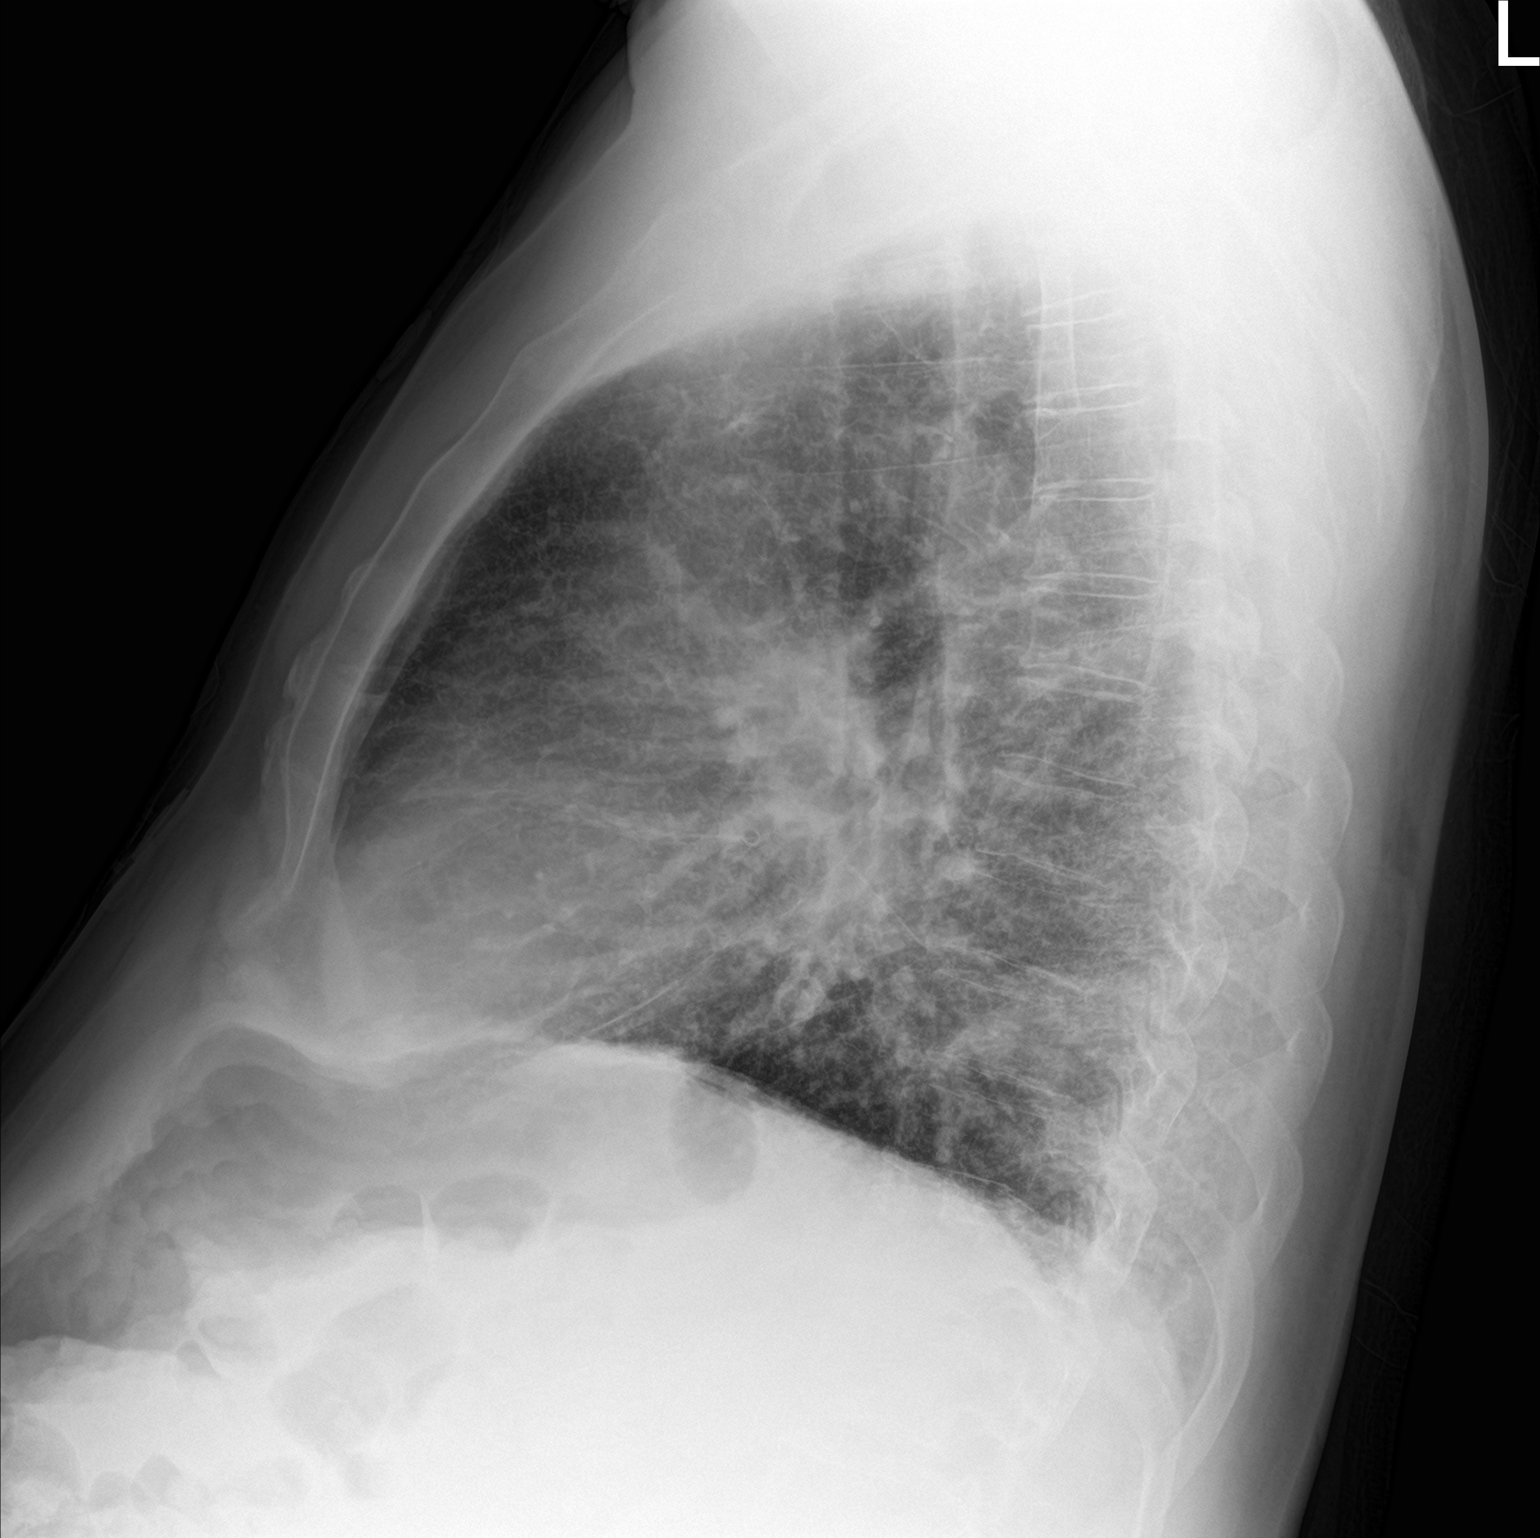

[2 of 2 positions shown; findings below may reference images not displayed]

FINDINGS: Normal cardiac silhouette. There is fine airspace pattern in the
lateral RIGHT lung which is similar prior. Similar appearance in the
peripheral LEFT lung with small linear interstitial markings. No
focal consolidation. No pneumothorax. New
IMPRESSION: 1. No significant change.
2. Interstitial edema pattern.
3. Some focality in the RIGHT mid lung is similar to comparison
exams therefore likely represents chronic interstitial lung disease

## 2016-05-21 ENCOUNTER — Other Ambulatory Visit: Payer: Self-pay | Admitting: Internal Medicine

## 2016-06-04 LAB — HM DIABETES EYE EXAM

## 2016-06-08 ENCOUNTER — Ambulatory Visit (HOSPITAL_BASED_OUTPATIENT_CLINIC_OR_DEPARTMENT_OTHER): Payer: Medicare Other | Admitting: Cardiology

## 2016-06-08 ENCOUNTER — Encounter (HOSPITAL_BASED_OUTPATIENT_CLINIC_OR_DEPARTMENT_OTHER): Payer: Self-pay

## 2016-06-08 ENCOUNTER — Encounter: Payer: Self-pay | Admitting: Internal Medicine

## 2016-06-11 NOTE — Progress Notes (Deleted)
This encounter was created in error - please disregard.  This encounter was created in error - please disregard.

## 2016-06-12 ENCOUNTER — Other Ambulatory Visit: Payer: Self-pay | Admitting: Internal Medicine

## 2016-06-12 NOTE — Telephone Encounter (Signed)
Routing to dr crawford----I Neil Carroll see this on patient's current med list---do you prescribe for patient and is it ok for refill----please advise, thanks

## 2016-06-12 NOTE — Telephone Encounter (Signed)
Dr. Dwyane Dee treats his diabetes and should prescribe.

## 2016-06-20 ENCOUNTER — Other Ambulatory Visit: Payer: Self-pay | Admitting: Cardiovascular Disease

## 2016-06-26 DIAGNOSIS — I70293 Other atherosclerosis of native arteries of extremities, bilateral legs: Secondary | ICD-10-CM | POA: Diagnosis not present

## 2016-06-26 DIAGNOSIS — E1351 Other specified diabetes mellitus with diabetic peripheral angiopathy without gangrene: Secondary | ICD-10-CM | POA: Diagnosis not present

## 2016-06-26 DIAGNOSIS — L602 Onychogryphosis: Secondary | ICD-10-CM | POA: Diagnosis not present

## 2016-06-26 DIAGNOSIS — L84 Corns and callosities: Secondary | ICD-10-CM | POA: Diagnosis not present

## 2016-06-29 ENCOUNTER — Encounter: Payer: Self-pay | Admitting: *Deleted

## 2016-06-29 NOTE — Progress Notes (Signed)
This encounter was created in error - please disregard.

## 2016-06-29 NOTE — Progress Notes (Deleted)
This encounter was created in error - please disregard.

## 2016-06-29 NOTE — Procedures (Signed)
Erroneous note

## 2016-07-06 ENCOUNTER — Encounter: Payer: Self-pay | Admitting: Cardiology

## 2016-07-06 ENCOUNTER — Encounter: Payer: Self-pay | Admitting: *Deleted

## 2016-07-06 ENCOUNTER — Ambulatory Visit (INDEPENDENT_AMBULATORY_CARE_PROVIDER_SITE_OTHER): Payer: Medicare Other | Admitting: Cardiology

## 2016-07-06 ENCOUNTER — Encounter (INDEPENDENT_AMBULATORY_CARE_PROVIDER_SITE_OTHER): Payer: Self-pay

## 2016-07-06 ENCOUNTER — Other Ambulatory Visit: Payer: Self-pay | Admitting: Cardiology

## 2016-07-06 VITALS — BP 118/70 | HR 111 | Ht 71.0 in | Wt 273.0 lb

## 2016-07-06 DIAGNOSIS — Z01812 Encounter for preprocedural laboratory examination: Secondary | ICD-10-CM | POA: Diagnosis not present

## 2016-07-06 DIAGNOSIS — I208 Other forms of angina pectoris: Secondary | ICD-10-CM

## 2016-07-06 DIAGNOSIS — I480 Paroxysmal atrial fibrillation: Secondary | ICD-10-CM

## 2016-07-06 DIAGNOSIS — I481 Persistent atrial fibrillation: Secondary | ICD-10-CM

## 2016-07-06 DIAGNOSIS — I4819 Other persistent atrial fibrillation: Secondary | ICD-10-CM

## 2016-07-06 LAB — BASIC METABOLIC PANEL WITH GFR
BUN: 14 mg/dL (ref 7–25)
CO2: 24 mmol/L (ref 20–31)
Calcium: 8.7 mg/dL (ref 8.6–10.3)
Chloride: 107 mmol/L (ref 98–110)
Creat: 1.29 mg/dL — ABNORMAL HIGH (ref 0.70–1.25)
Glucose, Bld: 125 mg/dL — ABNORMAL HIGH (ref 65–99)
Potassium: 4.5 mmol/L (ref 3.5–5.3)
Sodium: 141 mmol/L (ref 135–146)

## 2016-07-06 LAB — CBC WITH DIFFERENTIAL/PLATELET
BASOS PCT: 0 %
Basophils Absolute: 0 cells/uL (ref 0–200)
Eosinophils Absolute: 158 cells/uL (ref 15–500)
Eosinophils Relative: 2 %
HCT: 49.7 % (ref 38.5–50.0)
Hemoglobin: 16.6 g/dL (ref 13.2–17.1)
LYMPHS PCT: 29 %
Lymphs Abs: 2291 cells/uL (ref 850–3900)
MCH: 30.1 pg (ref 27.0–33.0)
MCHC: 33.4 g/dL (ref 32.0–36.0)
MCV: 90.2 fL (ref 80.0–100.0)
MONOS PCT: 11 %
MPV: 8.4 fL (ref 7.5–12.5)
Monocytes Absolute: 869 cells/uL (ref 200–950)
Neutro Abs: 4582 cells/uL (ref 1500–7800)
Neutrophils Relative %: 58 %
PLATELETS: 149 10*3/uL (ref 140–400)
RBC: 5.51 MIL/uL (ref 4.20–5.80)
RDW: 15.5 % — AB (ref 11.0–15.0)
WBC: 7.9 10*3/uL (ref 3.8–10.8)

## 2016-07-06 NOTE — Progress Notes (Addendum)
Electrophysiology Office Note   Date:  07/06/2016   ID:  Neil Goates., DOB 26-Nov-1950, MRN [id number]  PCP:  Hoyt Koch, MD  Cardiologist:  Burt Knack Primary Electrophysiologist:  Jailen Lung Meredith Leeds, MD    Chief Complaint  Patient presents with  . Follow-up    PAF/Atypical AFlutter  . Shortness of Breath     History of Present Illness: Neil Brill. is a 65 y.o. male who presents today for electrophysiology evaluation.   He has a history of atrial flutter status post redo frequency ablation, peripheral arterial disease with known SFA occlusive disease bilaterally, HTN, DM, COPD, tobacco use and CAD. He recently underwent a [place] by Dr. Burt Knack 06/2015. This showed severe distal left circumflex stenosis - recommend medical therapy as small amount of myocardium supplied, mild nonobstructive stenosis of a large, wrap around LAD and total occlusion of a small, codominant RCA. Dr. Burt Knack did not feel that PCI would significantly impact symptoms or overall clinical outcome, thus medical therapy was elected. He has been maintained on Plavix, metoprolol, Crestor and Losartan. He also takes Pletal for his PVD. Was diagnosed with atrial flutter after an ER visit and atrial fibrillation after event monitor.  Today, he denies symptoms of palpitations, orthopnea, PND, claudication, dizziness, presyncope, syncope, bleeding, or neurologic sequela. The patient is tolerating medications without difficulties and is otherwise without complaint today.  He does continue to have significant shortness of breath when walking 3-400 yards. He otherwise has no major complaints.   Past Medical History:  Diagnosis Date  . Adenomatous colon polyp   . Arthritis    left hip replacement  . Atrial flutter ([place])    onset  2011. s/p EPS/RFA 12/2011  . Cataract   . Chronic bronchitis   . Contact dermatitis and other eczema due to plants (except food)   . COPD (chronic obstructive pulmonary  disease) ([place])   . GERD (gastroesophageal reflux disease)   . Hyperlipidemia   . Hypertension   . LV dysfunction    EF 45-50% 12/2011  . Other peripheral vascular disease(443.89)    bilateral lower extremity  . Tobacco use disorder    dependent  . Transient global amnesia   . Tuberculosis   . Type II diabetes mellitus ([place])    Past Surgical History:  Procedure Laterality Date  . A FLUTTER ABLATION N/A 12/28/2011   Procedure: ABLATION A FLUTTER;  Surgeon: Evans Lance, MD;  Location[facility] CATH LAB;  Service: Cardiovascular;  Laterality: N/A;  . CARDIAC CATHETERIZATION N/A 07/10/2015   Procedure: Left Heart Cath and Coronary Angiography;  Surgeon: Sherren Mocha, MD;  Location: [facility] CV LAB;  Service: Cardiovascular;  Laterality: N/A;  . CARDIAC ELECTROPHYSIOLOGY MAPPING AND ABLATION  03/2010  . COLONOSCOPY    . colonoscopy with polypectomy  2006 & 2011   Dr [name]  . CYSTOSCOPY  11/2007   Dr Jeffie Pollock  . PARTIAL HIP ARTHROPLASTY Right 01/2000   "Right; replaced ball & stem"  . PARTIAL HIP ARTHROPLASTY Left   . POLYPECTOMY    . TEE WITHOUT CARDIOVERSION  12/28/2011   Procedure: TRANSESOPHAGEAL ECHOCARDIOGRAM (TEE);  Surgeon: Larey Dresser, MD;  Location: [facility] ENDOSCOPY;  Service: Cardiovascular;  Laterality: N/A;     Current Outpatient Prescriptions  Medication Sig Dispense Refill  . acetaminophen (TYLENOL) 650 MG CR tablet Take 650 mg by mouth every 8 (eight) hours as needed for pain.    . cilostazol (PLETAL) 100 MG tablet take 1 tablet by  mouth twice a day 180 tablet 1  . clobetasol (OLUX) 0.05 % topical foam Apply topically 2 (two) times daily.   0  . collagenase (SANTYL) ointment Apply 1 application topically daily.    [name] diltiazem (CARDIZEM CD) 180 MG 24 hr capsule Take 1 capsule (180 mg total) by mouth daily. 90 capsule 1  . etanercept (ENBREL SURECLICK) 50 MG/ML injection Inject 50 mg into the skin 2 (two) times a week. Inject on Sundays & Wednesdays    . Flaxseed, Linseed,  (FLAX SEED OIL) 1000 MG CAPS Take 1 capsule by mouth 2 (two) times daily.     . Fluticasone-Salmeterol (ADVAIR) 100-50 MCG/DOSE AEPB Inhale 1 puff into the lungs 2 (two) times daily. 180 each 1  . furosemide (LASIX) 40 MG tablet Take 2 tablets (80 mg total) by mouth 2 (two) times daily. 120 tablet 11  . LEVEMIR FLEXTOUCH 100 UNIT/ML Pen INJECT 24 UNITS            SUBCUTANEOUSLY DAILY 30 mL 1  . Liraglutide (VICTOZA) 18 MG/3ML SOPN INJECT 0.3ML (=1.'8MG'$ )      SUBCUTANEOUSLY DAILY 27 mL 1  . losartan (COZAAR) 100 MG tablet take 1 tablet by mouth once daily 30 tablet 10  . metformin (FORTAMET) 1000 MG (OSM) 24 hr tablet Take 1,000 mg by mouth 2 (two) times daily with a meal.    . metoprolol (LOPRESSOR) 100 MG tablet Take 100 mg by mouth 2 (two) times daily. also takes one half tab 50 mg along with regular dose of '100mg'$  twice daily    . Multiple Vitamins-Minerals (CENTRUM ADULTS) TABS Take 1 tablet by mouth daily.    . mupirocin ointment (BACTROBAN) 2 % Apply 1 application topically 2 (two) times daily as needed (affected areas).     . NOVOFINE 32G X 6 MM MISC USE 2 DAILY WITH LEVEMIR AND VICTOZA 100 each 5  . Omega-3 Fatty Acids (FISH OIL) 1000 MG CAPS Take 2 capsules by mouth 2 (two) times daily.     . potassium chloride SA (K-DUR,KLOR-CON) 20 MEQ tablet Take 1 tablet (20 mEq total) by mouth 2 (two) times daily. 60 tablet 11  . rivaroxaban (XARELTO) 20 MG TABS tablet Take 20 mg by mouth daily with supper.    . rosuvastatin (CRESTOR) 10 MG tablet Take 1 tablet (10 mg total) by mouth daily. 90 tablet 3  . tamsulosin (FLOMAX) 0.4 MG CAPS capsule Take 0.4 mg by mouth daily.     [name] triamcinolone cream (KENALOG) 0.1 % Apply 1 application topically 2 (two) times daily as needed (affected area).      No current facility-administered medications for this visit.     Allergies:   Niacin   Social History:  The patient  reports that he has been smoking Cigarettes.  He has a 44.00 pack-year smoking history. He  has never used smokeless tobacco. He reports that he drinks alcohol. He reports that he does not use drugs.   Family History:  The patient's family history includes Crohn's disease in his son; Diabetes in his brother, brother, mother, and paternal grandmother; Heart attack (age of onset: 77) in his brother; Hypertension in his mother; Lung cancer in his father; Stroke (age of onset: 21) in his mother; Throat cancer in his paternal uncle.    ROS:  Please see the history of present illness.   Otherwise, review of systems is positive for leg swelling, cough, DOE, snoring, wheezing, back pain, easy bruising.   All other systems are  reviewed and negative.    PHYSICAL EXAM: VS:  BP 118/70   Pulse (!) 111   Ht '5\' 11"'$  (1.803 m)   Wt 273 lb (123.8 kg)   BMI 38.08 kg/m  , BMI Body mass index is 38.08 kg/m. GEN: Well nourished, well developed, in no acute distress  HEENT: normal  Neck: no JVD, carotid bruits, or masses Cardiac: tachycardic, irregular; no murmurs, rubs, or gallops, 2-3+ edema to the knee  Respiratory:  Course throughout GI: soft, nontender, nondistended, + BS MS: no deformity or atrophy  Skin: warm and dry Neuro:  Strength and sensation are intact Psych: euthymic mood, full affect  EKG:  EKG is ordered today. The ekg ordered today shows atrial fibrillation, rate 111  Recent Labs: 11/15/2015: TSH 0.84 02/25/2016: B Natriuretic Peptide 226.9 02/27/2016: Hemoglobin 14.6; Platelets 157 02/28/2016: Magnesium 2.4 04/27/2016: ALT 14; BUN 12; Creatinine, Ser 1.26; Potassium 4.2; Sodium 139    Lipid Panel     Component Value Date/Time   CHOL 110 05/29/2015 1116   TRIG 96.0 05/29/2015 1116   HDL 38.20 (L) 05/29/2015 1116   CHOLHDL 3 05/29/2015 1116   VLDL 19.2 05/29/2015 1116   LDLCALC 53 05/29/2015 1116     Wt Readings from Last 3 Encounters:  07/06/16 273 lb (123.8 kg)  06/08/16 265 lb (120.2 kg)  04/29/16 266 lb (120.7 kg)      Other studies Reviewed: Additional  studies/ records that were reviewed today include: 30 day monitor 11/20/15  Review of the above records today demonstrates:  Sinus rhythm and atrial fibrillation No SVT or VT noted.  TTE 02/2016 - Left ventricle: Global longitudinal LV strain is calculated at   -11.7% but is inaccurate due to poor tracking. The cavity size   was normal. There was moderate concentric hypertrophy. Systolic   function was normal. The estimated ejection fraction was in the   range of 55% to 60%. Wall motion was normal; there were no   regional wall motion abnormalities. Normal sinus rhythm was   absent. The study is not technically sufficient to allow   evaluation of LV diastolic function. - Aortic valve: Poorly visualized. Trileaflet; normal thickness,   mildly calcified leaflets. There was mild regurgitation. - Mitral valve: Calcified annulus. There was mild regurgitation. - Tricuspid valve: There was mild regurgitation.  ASSESSMENT AND PLAN:  1.  Atrial fibrillation/atypical atrial flutter: Has had both atrial fibrillation and what appears to be atypical atrial flutter over the last few months. He is currently anticoagulated on rivaroxaban. He is still smoking at this time does not have any interest in quitting. His atrial fibrillation could be contributing to some of his shortness of breath, and we Emmerson Taddei therefore try a rhythm control strategy. We'll plan for cardioversion to see if this Starlina Lapre help with some of his shortness of breath. If it does, we'll plan to potentially start him on dofetilide.  This patients CHA2DS2-VASc Score and unadjusted Ischemic Stroke Rate (% per year) is equal to 3.2 % stroke rate/year from a score of 3  Above score calculated as 1 point each if present [CHF, HTN, DM, Vascular=MI/PAD/Aortic Plaque, Age if 65-74, or Male] Above score calculated as 2 points each if present [Age > 75, or Stroke/TIA/TE]  2. Hypertension: well controlled  3. Hyperlipidemia: on Crestor  4.  diastolic heart failure: Appears to be well compensated.  Current medicines are reviewed at length with the patient today.   The patient does not have concerns regarding his medicines.  The following changes were made today:  Refill Cardizem, increase Lasix for one week  Labs/ tests ordered today include:  No orders of the defined types were placed in this encounter.    Disposition:   FU with Mitsuko Luera 3 months  Signed, Kalyse Meharg Meredith Leeds, MD  07/06/2016 10:38 AM     [facility] [address] [place] [address] [phone] (office) [phone] (fax)

## 2016-07-06 NOTE — Patient Instructions (Signed)
Medication Instructions:    Your physician recommends that you continue on your current medications as directed. Please refer to the Current Medication list given to you today.  --- If you need a refill on your cardiac medications before your next appointment, please call your pharmacy. ---  Labwork:  Pre procedure labs today: BMET & CBC w/ diff  Testing/Procedures: Your physician has recommended that you have a Cardioversion (DCCV). Electrical Cardioversion uses a jolt of electricity to your heart either through paddles or wired patches attached to your chest. This is a controlled, usually prescheduled, procedure. Defibrillation is done under light anesthesia in the hospital, and you usually go home the day of the procedure. This is done to get your heart back into a normal rhythm. You are not awake for the procedure. Please see the instruction sheet given to you today.  Follow-Up:  Your physician recommends that you schedule a follow-up appointment in: 1 week, after your cardioversion on 07/13/2016, for nurse visit EKG.   Your physician recommends that you schedule a follow-up appointment in: 3 months with Dr. Curt Bears.  Thank you for choosing [facility]!!   Trinidad Curet, RN [phone]  Any Other Special Instructions Will Be Listed Below (If Applicable).  Electrical Cardioversion Electrical cardioversion is the delivery of a jolt of electricity to change the rhythm of the heart. Sticky patches or metal paddles are placed on the chest to deliver the electricity from a device. This is done to restore a normal rhythm. A rhythm that is too fast or not regular keeps the heart from pumping well. Electrical cardioversion is done in an emergency if:   There is low or no blood pressure as a result of the heart rhythm.   Normal rhythm must be restored as fast as possible to protect the brain and heart from further damage.   It may save a life. Cardioversion may be done for heart  rhythms that are not immediately life threatening, such as atrial fibrillation or flutter, in which:   The heart is beating too fast or is not regular.   Medicine to change the rhythm has not worked.   It is safe to wait in order to allow time for preparation.  Symptoms of the abnormal rhythm are bothersome.  The risk of stroke and other serious problems can be reduced. LET [facility] CARE PROVIDER KNOW ABOUT:   Any allergies you have.  All medicines you are taking, including vitamins, herbs, eye drops, creams, and over-the-counter medicines.  Previous problems you or members of your family have had with the use of anesthetics.   Any blood disorders you have.   Previous surgeries you have had.   Medical conditions you have. RISKS AND COMPLICATIONS  Generally, this is a safe procedure. However, problems can occur and include:   Breathing problems related to the anesthetic used.  A blood clot that breaks free and travels to other parts of your body. This could cause a stroke or other problems. The risk of this is lowered by use of blood-thinning medicine (anticoagulant) prior to the procedure.  Cardiac arrest (rare). BEFORE THE PROCEDURE   You may have tests to detect blood clots in your heart and to evaluate heart function.  You may start taking anticoagulants so your blood does not clot as easily.   Medicines may be given to help stabilize your heart rate and rhythm. PROCEDURE  You will be given medicine through an IV tube to reduce discomfort and make you sleepy (  sedative).   An electrical shock will be delivered. AFTER THE PROCEDURE Your heart rhythm will be watched to make sure it does not change.    This information is not intended to replace advice given to you by your health care provider. Make sure you discuss any questions you have with your health care provider.   Document Released: 08/28/2002 Document Revised: 09/28/2014 Document Reviewed:  03/22/2013 Elsevier Interactive Patient Education [employer].

## 2016-07-08 ENCOUNTER — Telehealth: Payer: Self-pay | Admitting: Cardiovascular Disease

## 2016-07-08 NOTE — Telephone Encounter (Signed)
Spoke with patient and advised they do want someone stay at the hospital during his procedure

## 2016-07-08 NOTE — Telephone Encounter (Signed)
New Message  Pt would like to speak with RN about his procedure on 10/23. Pt would like to know how long procedure will be and if his ride would be able to leave after dropping him off. Please call back to discuss

## 2016-07-10 ENCOUNTER — Telehealth: Payer: Self-pay | Admitting: *Deleted

## 2016-07-10 NOTE — Telephone Encounter (Signed)
Called patient to review anticoagulation status.  Patient reports he has been taking this medication for 3-4 months and confirms he has not missed a dose since starting medication. DCCV planned for next week.

## 2016-07-13 ENCOUNTER — Ambulatory Visit (HOSPITAL_COMMUNITY): Payer: Medicare Other | Admitting: Anesthesiology

## 2016-07-13 ENCOUNTER — Encounter (HOSPITAL_COMMUNITY)
Admission: RE | Disposition: A | Discharge status: Home / Self Care | Payer: Self-pay | Source: Ambulatory Visit | Attending: Cardiovascular Disease

## 2016-07-13 ENCOUNTER — Encounter (HOSPITAL_COMMUNITY): Payer: Self-pay | Admitting: *Deleted

## 2016-07-13 ENCOUNTER — Ambulatory Visit (HOSPITAL_COMMUNITY)
Admission: RE | Admit: 2016-07-13 | Discharge: 2016-07-13 | Disposition: A | Discharge status: Home / Self Care | Payer: Medicare Other | Source: Ambulatory Visit | Attending: Cardiovascular Disease | Admitting: Cardiovascular Disease

## 2016-07-13 DIAGNOSIS — I10 Essential (primary) hypertension: Secondary | ICD-10-CM | POA: Insufficient documentation

## 2016-07-13 DIAGNOSIS — E785 Hyperlipidemia, unspecified: Secondary | ICD-10-CM | POA: Diagnosis not present

## 2016-07-13 DIAGNOSIS — E1151 Type 2 diabetes mellitus with diabetic peripheral angiopathy without gangrene: Secondary | ICD-10-CM | POA: Insufficient documentation

## 2016-07-13 DIAGNOSIS — Z794 Long term (current) use of insulin: Secondary | ICD-10-CM | POA: Diagnosis not present

## 2016-07-13 DIAGNOSIS — Z79899 Other long term (current) drug therapy: Secondary | ICD-10-CM | POA: Diagnosis not present

## 2016-07-13 DIAGNOSIS — I480 Paroxysmal atrial fibrillation: Secondary | ICD-10-CM

## 2016-07-13 DIAGNOSIS — M1990 Unspecified osteoarthritis, unspecified site: Secondary | ICD-10-CM | POA: Insufficient documentation

## 2016-07-13 DIAGNOSIS — I484 Atypical atrial flutter: Secondary | ICD-10-CM | POA: Diagnosis not present

## 2016-07-13 DIAGNOSIS — K219 Gastro-esophageal reflux disease without esophagitis: Secondary | ICD-10-CM | POA: Insufficient documentation

## 2016-07-13 DIAGNOSIS — I4891 Unspecified atrial fibrillation: Secondary | ICD-10-CM | POA: Diagnosis not present

## 2016-07-13 DIAGNOSIS — J449 Chronic obstructive pulmonary disease, unspecified: Secondary | ICD-10-CM | POA: Insufficient documentation

## 2016-07-13 DIAGNOSIS — I2510 Atherosclerotic heart disease of native coronary artery without angina pectoris: Secondary | ICD-10-CM | POA: Diagnosis not present

## 2016-07-13 DIAGNOSIS — F17210 Nicotine dependence, cigarettes, uncomplicated: Secondary | ICD-10-CM | POA: Insufficient documentation

## 2016-07-13 HISTORY — PX: CARDIOVERSION: SHX1299

## 2016-07-13 LAB — POCT I-STAT 4, (NA,K, GLUC, HGB,HCT)
Glucose, Bld: 138 mg/dL — ABNORMAL HIGH (ref 65–99)
HCT: 51 % (ref 39.0–52.0)
Hemoglobin: 17.3 g/dL — ABNORMAL HIGH (ref 13.0–17.0)
Potassium: 5.7 mmol/L — ABNORMAL HIGH (ref 3.5–5.1)
Sodium: 141 mmol/L (ref 135–145)

## 2016-07-13 SURGERY — CARDIOVERSION
Anesthesia: Monitor Anesthesia Care

## 2016-07-13 MED ORDER — LIDOCAINE HCL (CARDIAC) 20 MG/ML IV SOLN
INTRAVENOUS | Status: DC | PRN
Start: 2016-07-13 — End: 2016-07-13
  Administered 2016-07-13: 40 mg via INTRAVENOUS

## 2016-07-13 MED ORDER — PROPOFOL 10 MG/ML IV BOLUS
INTRAVENOUS | Status: DC | PRN
  Administered 2016-07-13: 90 mg via INTRAVENOUS

## 2016-07-13 MED ORDER — SODIUM CHLORIDE 0.9 % IV SOLN
INTRAVENOUS | Status: DC
  Administered 2016-07-13: 10:00:00 via INTRAVENOUS

## 2016-07-13 NOTE — Anesthesia Preprocedure Evaluation (Addendum)
Anesthesia Evaluation  Patient identified by MRN, date of birth, ID band Patient awake    Reviewed: Allergy & Precautions, H&P , NPO status , Patient's Chart, lab work & pertinent test results  History of Anesthesia Complications Negative for: history of anesthetic complications  Airway Mallampati: II  TM Distance: >3 FB Neck ROM: full    Dental no notable dental hx. (+) Poor Dentition, Dental Advisory Given   Pulmonary COPD, Current Smoker,    Pulmonary exam normal breath sounds clear to auscultation       Cardiovascular hypertension, Pt. on medications and Pt. on home beta blockers + CAD and + Peripheral Vascular Disease  Normal cardiovascular exam+ dysrhythmias Atrial Fibrillation  Rhythm:regular Rate:Normal  EF 55% in 2016, nonobstructive CAD in LAD, medical therapy for distal RCA and LCx stenosis, small area of myocardium supplied   Neuro/Psych PSYCHIATRIC DISORDERS negative neurological ROS     GI/Hepatic Neg liver ROS, GERD  ,  Endo/Other  diabetes  Renal/GU negative Renal ROS     Musculoskeletal  (+) Arthritis ,   Abdominal   Peds  Hematology negative hematology ROS (+)   Anesthesia Other Findings   Reproductive/Obstetrics negative OB ROS                           Anesthesia Physical Anesthesia Plan  ASA: III  Anesthesia Plan: MAC   Post-op Pain Management:    Induction: Intravenous  Airway Management Planned: Mask  Additional Equipment:   Intra-op Plan:   Post-operative Plan:   Informed Consent: I have reviewed the patients History and Physical, chart, labs and discussed the procedure including the risks, benefits and alternatives for the proposed anesthesia with the patient or authorized representative who has indicated his/her understanding and acceptance.   Dental Advisory Given  Plan Discussed with: Anesthesiologist, CRNA and Surgeon  Anesthesia Plan  Comments:         Anesthesia Quick Evaluation

## 2016-07-13 NOTE — Interval H&P Note (Signed)
History and Physical Interval Note:  07/13/2016 12:24 PM  Georgann Housekeeper Sr.  has presented today for surgery, with the diagnosis of AFIB  The various methods of treatment have been discussed with the patient and family. After consideration of risks, benefits and other options for treatment, the patient has consented to  Procedure(s): CARDIOVERSION (N/A) as a surgical intervention .  The patient's history has been reviewed, patient examined, no change in status, stable for surgery.  I have reviewed the patient's chart and labs.  Questions were answered to the patient's satisfaction.     Skeet Latch , MD 07/13/2016 12:24 PM

## 2016-07-13 NOTE — Anesthesia Postprocedure Evaluation (Signed)
Anesthesia Post Note  Patient: Neil COBURN Sr.  Procedure(s) Performed: Procedure(s) (LRB): CARDIOVERSION (N/A)  Patient location during evaluation: PACU Anesthesia Type: MAC Level of consciousness: awake and alert Pain management: pain level controlled Vital Signs Assessment: post-procedure vital signs reviewed and stable Respiratory status: spontaneous breathing, nonlabored ventilation, respiratory function stable and patient connected to nasal cannula oxygen Cardiovascular status: stable and blood pressure returned to baseline Anesthetic complications: no    Last Vitals:  Vitals:   07/13/16 1250 07/13/16 1300  BP: 134/71 (!) 152/83  Pulse: 83 79  Resp: (!) 22 (!) 22  Temp:      Last Pain:  Vitals:   07/13/16 1249  TempSrc: Oral                 Zenaida Deed

## 2016-07-13 NOTE — Discharge Instructions (Signed)
Electrical Cardioversion, Care After °Refer to this sheet in the next few weeks. These instructions provide you with information on caring for yourself after your procedure. Your health care provider may also give you more specific instructions. Your treatment has been planned according to current medical practices, but problems sometimes occur. Call your health care provider if you have any problems or questions after your procedure. °WHAT TO EXPECT AFTER THE PROCEDURE °After your procedure, it is typical to have the following sensations: °· Some redness on the skin where the shocks were delivered. If this is tender, a sunburn lotion or hydrocortisone cream may help. °· Possible return of an abnormal heart rhythm within hours or days after the procedure. °HOME CARE INSTRUCTIONS °· Take medicines only as directed by your health care provider. Be sure you understand how and when to take your medicine. °· Learn how to feel your pulse and check it often. °· Limit your activity for 48 hours after the procedure or as directed by your health care provider. °· Avoid or minimize caffeine and other stimulants as directed by your health care provider. °SEEK MEDICAL CARE IF: °· You feel like your heart is beating too fast or your pulse is not regular. °· You have any questions about your medicines. °· You have bleeding that will not stop. °SEEK IMMEDIATE MEDICAL CARE IF: °· You are dizzy or feel faint. °· It is hard to breathe or you feel short of breath. °· There is a change in discomfort in your chest. °· Your speech is slurred or you have trouble moving an arm or leg on one side of your body. °· You get a serious muscle cramp that does not go away. °· Your fingers or toes turn cold or blue. °  °This information is not intended to replace advice given to you by your health care provider. Make sure you discuss any questions you have with your health care provider. °  °Document Released: 06/28/2013 Document Revised: 09/28/2014  Document Reviewed: 06/28/2013 °Elsevier Interactive Patient Education ©2016 Elsevier Inc. ° °

## 2016-07-13 NOTE — Transfer of Care (Signed)
Immediate Anesthesia Transfer of Care Note  Patient: Neil PONDS Sr.  Procedure(s) Performed: Procedure(s): CARDIOVERSION (N/A)  Patient Location: Endoscopy Unit  Anesthesia Type:MAC  Level of Consciousness: awake, oriented and patient cooperative  Airway & Oxygen Therapy: Patient Spontanous Breathing and Patient connected to face mask oxygen  Post-op Assessment: Report given to RN and Post -op Vital signs reviewed and stable  Post vital signs: Reviewed  Last Vitals:  Vitals:   07/13/16 1003 07/13/16 1249  BP: 126/82 134/71  Pulse: (!) 113 82  Resp: (!) 21 (!) 25  Temp: 37 C     Last Pain:  Vitals:   07/13/16 1003  TempSrc: Oral         Complications: No apparent anesthesia complications

## 2016-07-13 NOTE — CV Procedure (Signed)
Electrical Cardioversion Procedure Note Yoshio Seliga [id number] Jul 04, 1951  Procedure: Electrical Cardioversion Indications:  Atrial Fibrillation  Procedure Details Consent: Risks of procedure as well as the alternatives and risks of each were explained to the (patient/caregiver).  Consent for procedure obtained. Time Out: Verified patient identification, verified procedure, site/side was marked, verified correct patient position, special equipment/implants available, medications/allergies/relevent history reviewed, required imaging and test results available.  Performed  Patient placed on cardiac monitor, pulse oximetry, supplemental oxygen as necessary.  Sedation given: Propofol Pacer pads placed anterior and posterior chest.  Cardioverted 1 time(s).  Cardioverted at 150J.  Evaluation Findings: Post procedure EKG shows: Atrial Fibrillation Complications: None Patient did tolerate procedure well.   Skeet Latch, MD 07/13/2016, 12:40 PM

## 2016-07-13 NOTE — H&P (View-Only) (Signed)
Electrophysiology Office Note   Date:  07/06/2016   ID:  [name]., DOB March 05, 1951, MRN [id number]  PCP:  Hoyt Koch, MD  Cardiologist:  Burt Knack Primary Electrophysiologist:  Chandy Tarman Meredith Leeds, MD    Chief Complaint  Patient presents with  . Follow-up    PAF/Atypical AFlutter  . Shortness of Breath     History of Present Illness: Neil Carroll. is a 65 y.o. male who presents today for electrophysiology evaluation.   He has a history of atrial flutter status post redo frequency ablation, peripheral arterial disease with known SFA occlusive disease bilaterally, HTN, DM, COPD, tobacco use and CAD. He recently underwent a [place] by Dr. Burt Knack 06/2015. This showed severe distal left circumflex stenosis - recommend medical therapy as small amount of myocardium supplied, mild nonobstructive stenosis of a large, wrap around LAD and total occlusion of a small, codominant RCA. Dr. Burt Knack did not feel that PCI would significantly impact symptoms or overall clinical outcome, thus medical therapy was elected. He has been maintained on Plavix, metoprolol, Crestor and Losartan. He also takes Pletal for his PVD. Was diagnosed with atrial flutter after an ER visit and atrial fibrillation after event monitor.  Today, he denies symptoms of palpitations, orthopnea, PND, claudication, dizziness, presyncope, syncope, bleeding, or neurologic sequela. The patient is tolerating medications without difficulties and is otherwise without complaint today.  He does continue to have significant shortness of breath when walking 3-400 yards. He otherwise has no major complaints.   Past Medical History:  Diagnosis Date  . Adenomatous colon polyp   . Arthritis    left hip replacement  . Atrial flutter ([place])    onset  2011. s/p EPS/RFA 12/2011  . Cataract   . Chronic bronchitis   . Contact dermatitis and other eczema due to plants (except food)   . COPD (chronic obstructive pulmonary  disease) ([place])   . GERD (gastroesophageal reflux disease)   . Hyperlipidemia   . Hypertension   . LV dysfunction    EF 45-50% 12/2011  . Other peripheral vascular disease(443.89)    bilateral lower extremity  . Tobacco use disorder    dependent  . Transient global amnesia   . Tuberculosis   . Type II diabetes mellitus ([place])    Past Surgical History:  Procedure Laterality Date  . A FLUTTER ABLATION N/A 12/28/2011   Procedure: ABLATION A FLUTTER;  Surgeon: Evans Lance, MD;  Location: [facility] CATH LAB;  Service: Cardiovascular;  Laterality: N/A;  . CARDIAC CATHETERIZATION N/A 07/10/2015   Procedure: Left Heart Cath and Coronary Angiography;  Surgeon: Sherren Mocha, MD;  Location: [facility] CV LAB;  Service: Cardiovascular;  Laterality: N/A;  . CARDIAC ELECTROPHYSIOLOGY MAPPING AND ABLATION  03/2010  . COLONOSCOPY    . colonoscopy with polypectomy  2006 & 2011   Dr [name]  . CYSTOSCOPY  11/2007   Dr Jeffie Pollock  . PARTIAL HIP ARTHROPLASTY Right 01/2000   "Right; replaced ball & stem"  . PARTIAL HIP ARTHROPLASTY Left   . POLYPECTOMY    . TEE WITHOUT CARDIOVERSION  12/28/2011   Procedure: TRANSESOPHAGEAL ECHOCARDIOGRAM (TEE);  Surgeon: Larey Dresser, MD;  Location: [facility] ENDOSCOPY;  Service: Cardiovascular;  Laterality: N/A;     Current Outpatient Prescriptions  Medication Sig Dispense Refill  . acetaminophen (TYLENOL) 650 MG CR tablet Take 650 mg by mouth every 8 (eight) hours as needed for pain.    . cilostazol (PLETAL) 100 MG tablet take 1 tablet by  mouth twice a day 180 tablet 1  . clobetasol (OLUX) 0.05 % topical foam Apply topically 2 (two) times daily.   0  . collagenase (SANTYL) ointment Apply 1 application topically daily.    [name] diltiazem (CARDIZEM CD) 180 MG 24 hr capsule Take 1 capsule (180 mg total) by mouth daily. 90 capsule 1  . etanercept (ENBREL SURECLICK) 50 MG/ML injection Inject 50 mg into the skin 2 (two) times a week. Inject on Sundays & Wednesdays    . Flaxseed, Linseed,  (FLAX SEED OIL) 1000 MG CAPS Take 1 capsule by mouth 2 (two) times daily.     . Fluticasone-Salmeterol (ADVAIR) 100-50 MCG/DOSE AEPB Inhale 1 puff into the lungs 2 (two) times daily. 180 each 1  . furosemide (LASIX) 40 MG tablet Take 2 tablets (80 mg total) by mouth 2 (two) times daily. 120 tablet 11  . LEVEMIR FLEXTOUCH 100 UNIT/ML Pen INJECT 24 UNITS            SUBCUTANEOUSLY DAILY 30 mL 1  . Liraglutide (VICTOZA) 18 MG/3ML SOPN INJECT 0.3ML (=1.'8MG'$ )      SUBCUTANEOUSLY DAILY 27 mL 1  . losartan (COZAAR) 100 MG tablet take 1 tablet by mouth once daily 30 tablet 10  . metformin (FORTAMET) 1000 MG (OSM) 24 hr tablet Take 1,000 mg by mouth 2 (two) times daily with a meal.    . metoprolol (LOPRESSOR) 100 MG tablet Take 100 mg by mouth 2 (two) times daily. also takes one half tab 50 mg along with regular dose of '100mg'$  twice daily    . Multiple Vitamins-Minerals (CENTRUM ADULTS) TABS Take 1 tablet by mouth daily.    . mupirocin ointment (BACTROBAN) 2 % Apply 1 application topically 2 (two) times daily as needed (affected areas).     . NOVOFINE 32G X 6 MM MISC USE 2 DAILY WITH LEVEMIR AND VICTOZA 100 each 5  . Omega-3 Fatty Acids (FISH OIL) 1000 MG CAPS Take 2 capsules by mouth 2 (two) times daily.     . potassium chloride SA (K-DUR,KLOR-CON) 20 MEQ tablet Take 1 tablet (20 mEq total) by mouth 2 (two) times daily. 60 tablet 11  . rivaroxaban (XARELTO) 20 MG TABS tablet Take 20 mg by mouth daily with supper.    . rosuvastatin (CRESTOR) 10 MG tablet Take 1 tablet (10 mg total) by mouth daily. 90 tablet 3  . tamsulosin (FLOMAX) 0.4 MG CAPS capsule Take 0.4 mg by mouth daily.     [name] triamcinolone cream (KENALOG) 0.1 % Apply 1 application topically 2 (two) times daily as needed (affected area).      No current facility-administered medications for this visit.     Allergies:   Niacin   Social History:  The patient  reports that he has been smoking Cigarettes.  He has a 44.00 pack-year smoking history. He  has never used smokeless tobacco. He reports that he drinks alcohol. He reports that he does not use drugs.   Family History:  The patient's family history includes Crohn's disease in his son; Diabetes in his brother, brother, mother, and paternal grandmother; Heart attack (age of onset: 8) in his brother; Hypertension in his mother; Lung cancer in his father; Stroke (age of onset: 51) in his mother; Throat cancer in his paternal uncle.    ROS:  Please see the history of present illness.   Otherwise, review of systems is positive for leg swelling, cough, DOE, snoring, wheezing, back pain, easy bruising.   All other systems are  reviewed and negative.    PHYSICAL EXAM: VS:  BP 118/70   Pulse (!) 111   Ht '5\' 11"'$  (1.803 m)   Wt 273 lb (123.8 kg)   BMI 38.08 kg/m  , BMI Body mass index is 38.08 kg/m. GEN: Well nourished, well developed, in no acute distress  HEENT: normal  Neck: no JVD, carotid bruits, or masses Cardiac: tachycardic, irregular; no murmurs, rubs, or gallops, 2-3+ edema to the knee  Respiratory:  Course throughout GI: soft, nontender, nondistended, + BS MS: no deformity or atrophy  Skin: warm and dry Neuro:  Strength and sensation are intact Psych: euthymic mood, full affect  EKG:  EKG is ordered today. The ekg ordered today shows atrial fibrillation, rate 111  Recent Labs: 11/15/2015: TSH 0.84 02/25/2016: B Natriuretic Peptide 226.9 02/27/2016: Hemoglobin 14.6; Platelets 157 02/28/2016: Magnesium 2.4 04/27/2016: ALT 14; BUN 12; Creatinine, Ser 1.26; Potassium 4.2; Sodium 139    Lipid Panel     Component Value Date/Time   CHOL 110 05/29/2015 1116   TRIG 96.0 05/29/2015 1116   HDL 38.20 (L) 05/29/2015 1116   CHOLHDL 3 05/29/2015 1116   VLDL 19.2 05/29/2015 1116   LDLCALC 53 05/29/2015 1116     Wt Readings from Last 3 Encounters:  07/06/16 273 lb (123.8 kg)  06/08/16 265 lb (120.2 kg)  04/29/16 266 lb (120.7 kg)      Other studies Reviewed: Additional  studies/ records that were reviewed today include: 30 day monitor 11/20/15  Review of the above records today demonstrates:  Sinus rhythm and atrial fibrillation No SVT or VT noted.  TTE 02/2016 - Left ventricle: Global longitudinal LV strain is calculated at   -11.7% but is inaccurate due to poor tracking. The cavity size   was normal. There was moderate concentric hypertrophy. Systolic   function was normal. The estimated ejection fraction was in the   range of 55% to 60%. Wall motion was normal; there were no   regional wall motion abnormalities. Normal sinus rhythm was   absent. The study is not technically sufficient to allow   evaluation of LV diastolic function. - Aortic valve: Poorly visualized. Trileaflet; normal thickness,   mildly calcified leaflets. There was mild regurgitation. - Mitral valve: Calcified annulus. There was mild regurgitation. - Tricuspid valve: There was mild regurgitation.  ASSESSMENT AND PLAN:  1.  Atrial fibrillation/atypical atrial flutter: Has had both atrial fibrillation and what appears to be atypical atrial flutter over the last few months. He is currently anticoagulated on rivaroxaban. He is still smoking at this time does not have any interest in quitting. His atrial fibrillation could be contributing to some of his shortness of breath, and we Lawerence Dery therefore try a rhythm control strategy. We'll plan for cardioversion to see if this Candence Sease help with some of his shortness of breath. If it does, we'll plan to potentially start him on dofetilide.  This patients CHA2DS2-VASc Score and unadjusted Ischemic Stroke Rate (% per year) is equal to 3.2 % stroke rate/year from a score of 3  Above score calculated as 1 point each if present [CHF, HTN, DM, Vascular=MI/PAD/Aortic Plaque, Age if 65-74, or Male] Above score calculated as 2 points each if present [Age > 75, or Stroke/TIA/TE]  2. Hypertension: well controlled  3. Hyperlipidemia: on Crestor  4.  diastolic heart failure: Appears to be well compensated.  Current medicines are reviewed at length with the patient today.   The patient does not have concerns regarding his medicines.  The following changes were made today:  Refill Cardizem, increase Lasix for one week  Labs/ tests ordered today include:  No orders of the defined types were placed in this encounter.    Disposition:   FU with Elexis Pollak 3 months  Signed, Ramond Darnell Meredith Leeds, MD  07/06/2016 10:38 AM     [facility] [address] [place] [address] [phone] (office) [phone] (fax)

## 2016-07-14 ENCOUNTER — Encounter (HOSPITAL_COMMUNITY): Payer: Self-pay | Admitting: Cardiovascular Disease

## 2016-07-22 ENCOUNTER — Ambulatory Visit (INDEPENDENT_AMBULATORY_CARE_PROVIDER_SITE_OTHER): Payer: Medicare Other | Admitting: *Deleted

## 2016-07-22 ENCOUNTER — Other Ambulatory Visit: Payer: Self-pay | Admitting: Cardiovascular Disease

## 2016-07-22 VITALS — BP 122/78 | HR 85 | Ht 71.0 in | Wt 277.2 lb

## 2016-07-22 DIAGNOSIS — I4891 Unspecified atrial fibrillation: Secondary | ICD-10-CM

## 2016-07-22 NOTE — Patient Instructions (Signed)
Pt is aware that Dr Curt Bears will call him back with recommendations. Pt verbalized understanding.

## 2016-07-22 NOTE — Telephone Encounter (Signed)
metoprolol (LOPRESSOR) 100 MG tablet  Medication  Date: 04/28/2016 Department: [address] Office Ordering: Carla Drape Authorizing: Historical Provider, MD  Order Providers   Documenting Provider Encounter Provider  Historical Provider, MD Imogene Burn, PA-C  Supervision Information   Supervising Provider Type of Supervision  Belva Crome, MD Incident To  Medication Detail    Disp Refills Start End   metoprolol (LOPRESSOR) 100 MG tablet       Sig - Route: Take 100 mg by mouth 2 (two) times daily. also takes one half tab 50 mg along with regular dose of '100mg'$  twice daily - Oral   Class: [id number], [place] - [place]

## 2016-07-22 NOTE — Progress Notes (Signed)
1.) Reason for visit: 12 lead EKG   2.) Name of MD requesting visit: Will Camnitz MD  3.) H&P: Atrial fibrillation/Aflutter, post Ablation on 07/13/16  4.) ROS related to problem: Hx of atrial flutter.redo frequency  ablation  5.) Assessment and plan per MD: pt in for a 12 lead EKG. Post ablation 07/13/16. BP 122/78, HR 85 beats/minute, showing A-Fibrillation. 12 lead EKG given to Dr. Curt Bears MD. Md states will call pt with recommendations.

## 2016-07-23 ENCOUNTER — Telehealth: Payer: Self-pay | Admitting: *Deleted

## 2016-07-23 NOTE — Telephone Encounter (Signed)
Pt had EKG yesterday morning post DCCV.  Called pt per Dr. Curt Bears: Spoke with patient yesterday afternoon to follow up after nurse visit that morning.  Pt tells me that he is doing ok, denies symptoms of palpitations, SOB, orthopnea, dizziness, presyncope or syncope. Discussed monitoring symptoms and calling office if issues begin.  Otherwise patient will follow up in January as scheduled.  Patient verbalized understanding and agreeable to plan.

## 2016-07-24 NOTE — Telephone Encounter (Signed)
I contacted the pt to clarify how he is currently taking Metoprolol Tartrate.  Per the pt he is taking Metoprolol Tartrate '100mg'$  one tablet every morning and one and one-half tablet every evening.  I will update his prescription and send in a refill.

## 2016-08-04 ENCOUNTER — Ambulatory Visit (HOSPITAL_BASED_OUTPATIENT_CLINIC_OR_DEPARTMENT_OTHER): Payer: Medicare Other | Attending: Cardiology | Admitting: Cardiology

## 2016-08-04 VITALS — Ht 71.0 in | Wt 272.0 lb

## 2016-08-04 DIAGNOSIS — I4891 Unspecified atrial fibrillation: Secondary | ICD-10-CM | POA: Insufficient documentation

## 2016-08-04 DIAGNOSIS — G4733 Obstructive sleep apnea (adult) (pediatric): Secondary | ICD-10-CM | POA: Diagnosis not present

## 2016-08-04 DIAGNOSIS — G4736 Sleep related hypoventilation in conditions classified elsewhere: Secondary | ICD-10-CM | POA: Insufficient documentation

## 2016-08-04 DIAGNOSIS — G4734 Idiopathic sleep related nonobstructive alveolar hypoventilation: Secondary | ICD-10-CM

## 2016-08-07 ENCOUNTER — Ambulatory Visit (INDEPENDENT_AMBULATORY_CARE_PROVIDER_SITE_OTHER): Payer: Medicare Other | Admitting: Cardiovascular Disease

## 2016-08-07 ENCOUNTER — Encounter: Payer: Self-pay | Admitting: Cardiovascular Disease

## 2016-08-07 VITALS — BP 140/80 | HR 102 | Ht 71.0 in | Wt 277.1 lb

## 2016-08-07 DIAGNOSIS — I5033 Acute on chronic diastolic (congestive) heart failure: Secondary | ICD-10-CM

## 2016-08-07 DIAGNOSIS — I208 Other forms of angina pectoris: Secondary | ICD-10-CM | POA: Diagnosis not present

## 2016-08-07 DIAGNOSIS — I4891 Unspecified atrial fibrillation: Secondary | ICD-10-CM

## 2016-08-07 MED ORDER — METOLAZONE 2.5 MG PO TABS: ORAL_TABLET | ORAL | 1 refills | Status: DC

## 2016-08-07 NOTE — Progress Notes (Signed)
Cardiology Office Note Date:  08/07/2016   ID:  Neil Housekeeper Sr., DOB 04/07/1951, MRN [id number]  PCP:  Hoyt Koch, MD  Cardiologist:  Sherren Mocha, MD    Chief Complaint  Patient presents with  . Atrial Fibrillation     History of Present Illness: Neil Rudin. is a 65 y.o. male who presents for Follow-up evaluation. The patient has had atrial flutter status post radiofrequency ablation, peripheral arterial disease with bilateral SFA occlusions, coronary artery disease with medical therapy recommended. Was recently found to have recurrence of atrial flutter and fibrillation. He underwent cardioversion October 23 but reverted to atrial fibrillation on follow-up EKG November 1. Now presents for follow-up.   Complains of shortness of breath, cough. No chest pain but occasionally feels a pressure. He is not active. Continues to smoke. Never had any symptomatic change with cardioversion.     Past Medical History:  Diagnosis Date  . Adenomatous colon polyp   . Arthritis    left hip replacement  . Atrial flutter ([place])    onset  2011. s/p EPS/RFA 12/2011  . Cataract   . Chronic bronchitis   . Contact dermatitis and other eczema due to plants (except food)   . COPD (chronic obstructive pulmonary disease) ([place])   . GERD (gastroesophageal reflux disease)   . Hyperlipidemia   . Hypertension   . LV dysfunction    EF 45-50% 12/2011  . Other peripheral vascular disease(443.89)    bilateral lower extremity  . Tobacco use disorder    dependent  . Transient global amnesia   . Tuberculosis   . Type II diabetes mellitus ([place])     Past Surgical History:  Procedure Laterality Date  . A FLUTTER ABLATION N/A 12/28/2011   Procedure: ABLATION A FLUTTER;  Surgeon: Evans Lance, MD;  Location: [facility] CATH LAB;  Service: Cardiovascular;  Laterality: N/A;  . CARDIAC CATHETERIZATION N/A 07/10/2015   Procedure: Left Heart Cath and Coronary Angiography;  Surgeon: Sherren Mocha, MD;  Location: [facility] CV LAB;  Service: Cardiovascular;  Laterality: N/A;  . CARDIAC ELECTROPHYSIOLOGY MAPPING AND ABLATION  03/2010  . CARDIOVERSION N/A 07/13/2016   Procedure: CARDIOVERSION;  Surgeon: Skeet Latch, MD;  Location: [facility];  Service: Cardiovascular;  Laterality: N/A;  . COLONOSCOPY    . colonoscopy with polypectomy  2006 & 2011   Dr [name]  . CYSTOSCOPY  11/2007   Dr Jeffie Pollock  . PARTIAL HIP ARTHROPLASTY Right 01/2000   "Right; replaced ball & stem"  . PARTIAL HIP ARTHROPLASTY Left   . POLYPECTOMY    . TEE WITHOUT CARDIOVERSION  12/28/2011   Procedure: TRANSESOPHAGEAL ECHOCARDIOGRAM (TEE);  Surgeon: Larey Dresser, MD;  Location: [facility] ENDOSCOPY;  Service: Cardiovascular;  Laterality: N/A;    Current Outpatient Prescriptions  Medication Sig Dispense Refill  . acetaminophen (TYLENOL) 650 MG CR tablet Take 650 mg by mouth every 8 (eight) hours as needed for pain.    . cilostazol (PLETAL) 100 MG tablet take 1 tablet by mouth twice a day 180 tablet 1  . clobetasol (OLUX) 0.05 % topical foam Apply topically 2 (two) times daily.   0  . collagenase (SANTYL) ointment Apply 1 application topically daily.    [name] diltiazem (CARDIZEM CD) 180 MG 24 hr capsule Take 1 capsule (180 mg total) by mouth daily. 90 capsule 1  . etanercept (ENBREL SURECLICK) 50 MG/ML injection Inject 50 mg into the skin 2 (two) times a week. Inject on Sundays & Wednesdays    .  Flaxseed, Linseed, (FLAX SEED OIL) 1000 MG CAPS Take 1 capsule by mouth 2 (two) times daily.     . Fluticasone-Salmeterol (ADVAIR) 100-50 MCG/DOSE AEPB Inhale 1 puff into the lungs 2 (two) times daily. 180 each 1  . furosemide (LASIX) 40 MG tablet Take 2 tablets (80 mg total) by mouth 2 (two) times daily. 120 tablet 11  . LEVEMIR FLEXTOUCH 100 UNIT/ML Pen INJECT 24 UNITS            SUBCUTANEOUSLY DAILY 30 mL 1  . Liraglutide (VICTOZA) 18 MG/3ML SOPN INJECT 0.3ML (=1.'8MG'$ )      SUBCUTANEOUSLY DAILY 27 mL 1  . losartan (COZAAR)  100 MG tablet take 1 tablet by mouth once daily 30 tablet 10  . metformin (FORTAMET) 1000 MG (OSM) 24 hr tablet Take 1,000 mg by mouth 2 (two) times daily with a meal.    . metoprolol (LOPRESSOR) 100 MG tablet Take 100 mg by mouth every morning. And take 1 and 1/2 tablets by mouth every evening    . Multiple Vitamins-Minerals (CENTRUM ADULTS) TABS Take 1 tablet by mouth daily.    . mupirocin ointment (BACTROBAN) 2 % Apply 1 application topically 2 (two) times daily as needed (affected areas).     . NOVOFINE 32G X 6 MM MISC USE 2 DAILY WITH LEVEMIR AND VICTOZA 100 each 5  . Omega-3 Fatty Acids (FISH OIL) 1000 MG CAPS Take 2 capsules by mouth 2 (two) times daily.     . potassium chloride SA (K-DUR,KLOR-CON) 20 MEQ tablet Take 1 tablet (20 mEq total) by mouth 2 (two) times daily. 60 tablet 11  . rivaroxaban (XARELTO) 20 MG TABS tablet Take 20 mg by mouth daily with supper.    . rosuvastatin (CRESTOR) 10 MG tablet Take 1 tablet (10 mg total) by mouth daily. 90 tablet 3  . tamsulosin (FLOMAX) 0.4 MG CAPS capsule Take 0.4 mg by mouth daily.     [name] triamcinolone cream (KENALOG) 0.1 % Apply 1 application topically 2 (two) times daily as needed (affected area).      No current facility-administered medications for this visit.     Allergies:   Niacin   Social History:  The patient  reports that he has been smoking Cigarettes.  He has a 44.00 pack-year smoking history. He has never used smokeless tobacco. He reports that he drinks alcohol. He reports that he does not use drugs.   Family History:  The patient's  family history includes Crohn's disease in his son; Diabetes in his brother, brother, mother, and paternal grandmother; Heart attack (age of onset: 24) in his brother; Hypertension in his mother; Lung cancer in his father; Stroke (age of onset: 88) in his mother; Throat cancer in his paternal uncle.    ROS:  Please see the history of present illness.  Otherwise, review of systems is positive for  leg swelling, orthopnea, cough, back pain, rash, fatigue, wheezing.  All other systems are reviewed and negative.    PHYSICAL EXAM: VS:  BP 140/80   Pulse (!) 102   Ht '5\' 11"'$  (1.803 m)   Wt 277 lb 1.9 oz (125.7 kg)   BMI 38.65 kg/m  , BMI Body mass index is 38.65 kg/m. GEN: Well nourished, well developed, obese male in no acute distress  HEENT: normal  Neck: JVP moderately elevated, no masses. No carotid bruits Cardiac: irregularly irregular without murmur or gallop                Respiratory:  clear to auscultation bilaterally, normal work of breathing GI: soft, nontender, nondistended, + BS MS: no deformity or atrophy  Ext: 1+ pretibial edema Skin: warm and dry, no rash Neuro:  Strength and sensation are intact Psych: euthymic mood, full affect  EKG:  EKG is not ordered today.  Recent Labs: 11/15/2015: TSH 0.84 02/25/2016: B Natriuretic Peptide 226.9 02/28/2016: Magnesium 2.4 04/27/2016: ALT 14 07/06/2016: BUN 14; Creat 1.29; Platelets 149 07/13/2016: Hemoglobin 17.3; Potassium 5.7; Sodium 141   Lipid Panel     Component Value Date/Time   CHOL 110 05/29/2015 1116   TRIG 96.0 05/29/2015 1116   HDL 38.20 (L) 05/29/2015 1116   CHOLHDL 3 05/29/2015 1116   VLDL 19.2 05/29/2015 1116   LDLCALC 53 05/29/2015 1116      Wt Readings from Last 3 Encounters:  08/07/16 277 lb 1.9 oz (125.7 kg)  08/04/16 272 lb (123.4 kg)  07/22/16 277 lb 4 oz (125.8 kg)     Cardiac Studies Reviewed: Echo 02-26-2016: Study Conclusions  - Left ventricle: Global longitudinal LV strain is calculated at   -11.7% but is inaccurate due to poor tracking. The cavity size   was normal. There was moderate concentric hypertrophy. Systolic   function was normal. The estimated ejection fraction was in the   range of 55% to 60%. Wall motion was normal; there were no   regional wall motion abnormalities. Normal sinus rhythm was   absent. The study is not technically sufficient to allow   evaluation of LV  diastolic function. - Aortic valve: Poorly visualized. Trileaflet; normal thickness,   mildly calcified leaflets. There was mild regurgitation. - Mitral valve: Calcified annulus. There was mild regurgitation. - Tricuspid valve: There was mild regurgitation.  ASSESSMENT AND PLAN: 1.  Acute on chronic diastolic CHF, Stage C, NYHA III: currently on furosemide 80 mg BID. Add metolozone 2.5 mg once/week. Sodium restriction and daily weights reviewed with patient. Otherwise continue current Rx.  2. CAD, native vessel, without angina: continue medical management. Medications reviewed.  3. HTN, controlled.  4. Atrial fibrillation: failed DCCV. No change in symptoms. Continue anticoagulation, rate-control.  5. Tobacco: not ready to quit.   6. Obstructive sleep apnea: sleep study reviewed. Will make sure he has FU.   Current medicines are reviewed with the patient today.  The patient does not have concerns regarding medicines.  Labs/ tests ordered today include:  No orders of the defined types were placed in this encounter.   Disposition:   FU 3 months with APP  Signed, Sherren Mocha, MD  08/07/2016 10:22 AM    [facility] [address], [place], [place]  [address] Phone: [phone]; Fax: [phone]

## 2016-08-07 NOTE — Patient Instructions (Signed)
Medication Instructions:  Your physician has recommended you make the following change in your medication:  1. START Metolazone 2.'5mg'$  take one tablet by mouth 30 minutes prior to morning Furosemide dosage as needed once a week for weight gain greater than 5 pounds.  You can take a dosage tomorrow morning.  Labwork: No new orders.   Testing/Procedures: No new orders.   Follow-Up: Your physician recommends that you schedule a follow-up appointment in: 3 MONTHS with PA/NP  Any Other Special Instructions Will Be Listed Below (If Applicable).     If you need a refill on your cardiac medications before your next appointment, please call your pharmacy.

## 2016-08-09 ENCOUNTER — Encounter: Payer: Self-pay | Admitting: Cardiovascular Disease

## 2016-08-09 NOTE — Procedures (Signed)
   Patient Name: Neil Carroll, Pretty Date: 08/04/2016 Gender: Male D.O.B: [birth year] Age (years): 66 Referring Provider: Will Camnitz Height (inches): 71 Interpreting Physician: Fransico Him MD, ABSM Weight (lbs): 269 RPSGT: Madelon Lips BMI: 38 MRN: [id number] Neck Size: 18.00  CLINICAL INFORMATION The patient is referred for a CPAP titration to treat sleep apnea. Date of NPSG, Split Night or HST:04/05/2016  SLEEP STUDY TECHNIQUE As per the AASM Manual for the Scoring of Sleep and Associated Events v2.3 (April 2016) with a hypopnea requiring 4% desaturations. The channels recorded and monitored were frontal, central and occipital EEG, electrooculogram (EOG), submentalis EMG (chin), nasal and oral airflow, thoracic and abdominal wall motion, anterior tibialis EMG, snore microphone, electrocardiogram, and pulse oximetry. Continuous positive airway pressure (CPAP) was initiated at the beginning of the study and titrated to treat sleep-disordered breathing.  MEDICATIONS Medications self-administered by patient taken the night of the study : N/A  TECHNICIAN COMMENTS Comments added by technician: O2 initiated due to low sats. BATHROOM BREAKS X2  Comments added by scorer: N/A  RESPIRATORY PARAMETERS Optimal PAP Pressure (cm): 13  AHI at Optimal Pressure (/hr):1.6 Overall Minimal O2 (%):82.00  Supine % at Optimal Pressure (%):9 Minimal O2 at Optimal Pressure (%): 84.0    SLEEP ARCHITECTURE The study was initiated at 10:28:54 PM and ended at 4:56:37 AM. Sleep onset time was 3.1 minutes and the sleep efficiency was 80.1%. The total sleep time was 310.5 minutes. The patient spent 19.16% of the night in stage N1 sleep, 58.78% in stage N2 sleep, 0.00% in stage N3 and 22.06% in REM.Stage REM latency was 90.0 minutes Wake after sleep onset was 74.1. Alpha intrusion was absent. Supine sleep was 1.77%.  CARDIAC DATA The 2 lead EKG demonstrated atrial fibrillation.  The mean heart  rate was 91.07 beats per minute.   LEG MOVEMENT DATA The total Periodic Limb Movements of Sleep (PLMS) were 117. The PLMS index was 22.61. A PLMS index of <15 is considered normal in adults.  IMPRESSIONS - The optimal PAP pressure was 13 cm of water. - Central sleep apnea was not noted during this titration (CAI = 0.0/h). - Moderate oxygen desaturations were observed during this titration (min O2 = 82.00%). - The patient snored with Loud snoring volume during this titration study. - 2-lead EKG demonstrated: Atrial Fibrillation - Mild periodic limb movements were observed during this study. Arousals associated with PLMs were rare.  DIAGNOSIS - Obstructive Sleep Apnea (327.23 [G47.33 ICD-10]) - Atrial Fibrillation - Nocturnal Hypoxemia  RECOMMENDATIONS - Trial of CPAP therapy on 13 cm H2O with a Large size Resmed Nasal Mask Airfit N20 mask and heated humidification. - Avoid alcohol, sedatives and other CNS depressants that may worsen sleep apnea and disrupt normal sleep architecture. - Sleep hygiene should be reviewed to assess factors that may improve sleep quality. - Weight management and regular exercise should be initiated or continued. - Return to Sleep Center for re-evaluation after 10 weeks of therapy - Overnight pulse oximetry on CPAP and Oxygen at 2L via CPAP to make sure hypoxemia has resolved.  Home, American Board of Sleep Medicine  ELECTRONICALLY SIGNED ON:  08/09/2016, 5:48 PM [place] PH: [phone]   FX: [phone] [place]

## 2016-08-10 ENCOUNTER — Other Ambulatory Visit: Payer: Self-pay | Admitting: *Deleted

## 2016-08-10 DIAGNOSIS — R0902 Hypoxemia: Secondary | ICD-10-CM

## 2016-08-24 ENCOUNTER — Other Ambulatory Visit: Payer: Self-pay | Admitting: Cardiology

## 2016-09-02 DIAGNOSIS — E1151 Type 2 diabetes mellitus with diabetic peripheral angiopathy without gangrene: Secondary | ICD-10-CM | POA: Diagnosis not present

## 2016-09-02 DIAGNOSIS — L602 Onychogryphosis: Secondary | ICD-10-CM | POA: Diagnosis not present

## 2016-09-03 ENCOUNTER — Other Ambulatory Visit (INDEPENDENT_AMBULATORY_CARE_PROVIDER_SITE_OTHER): Payer: Medicare Other

## 2016-09-03 DIAGNOSIS — E669 Obesity, unspecified: Secondary | ICD-10-CM | POA: Diagnosis not present

## 2016-09-03 DIAGNOSIS — E1169 Type 2 diabetes mellitus with other specified complication: Secondary | ICD-10-CM | POA: Diagnosis not present

## 2016-09-03 LAB — BASIC METABOLIC PANEL
BUN: 16 mg/dL (ref 6–23)
CO2: 30 mEq/L (ref 19–32)
CREATININE: 1.48 mg/dL (ref 0.40–1.50)
Calcium: 9.5 mg/dL (ref 8.4–10.5)
Chloride: 104 mEq/L (ref 96–112)
GFR: 61.09 mL/min (ref >60.00)
GLUCOSE: 140 mg/dL — AB (ref 70–99)
POTASSIUM: 4.3 meq/L (ref 3.5–5.1)
Sodium: 143 mEq/L (ref 135–145)

## 2016-09-03 LAB — HEMOGLOBIN A1C: Hgb A1c MFr Bld: 7.4 % — ABNORMAL HIGH (ref 4.6–6.5)

## 2016-09-07 ENCOUNTER — Ambulatory Visit (INDEPENDENT_AMBULATORY_CARE_PROVIDER_SITE_OTHER): Payer: Medicare Other | Admitting: Endocrinology

## 2016-09-07 ENCOUNTER — Encounter: Payer: Self-pay | Admitting: Endocrinology

## 2016-09-07 VITALS — BP 128/78 | HR 85 | Ht 71.0 in | Wt 272.0 lb

## 2016-09-07 DIAGNOSIS — Z794 Long term (current) use of insulin: Secondary | ICD-10-CM

## 2016-09-07 DIAGNOSIS — E1165 Type 2 diabetes mellitus with hyperglycemia: Secondary | ICD-10-CM

## 2016-09-07 DIAGNOSIS — I208 Other forms of angina pectoris: Secondary | ICD-10-CM

## 2016-09-07 NOTE — Patient Instructions (Addendum)
More sugars after meals esp at nite

## 2016-09-07 NOTE — Progress Notes (Signed)
Patient ID: Neil Degregory., male   DOB: Jul 20, 1951, 65 y.o.   MRN: [id number]   Reason for Appointment : Followup for Type 2 Diabetes  History of Present Illness          Diagnosis: Type 2 diabetes mellitus, date of diagnosis:  2008     Past history: He has been treated with metformin only for several years and had relatively good control until about a year ago. However had been taking only 1000 mg of metformin. Because of his progressively higher blood sugars he had been started on Levemir insulin since 6/14.  However with this his A1c was still 10.2 in 9/14 and he was referred here With increasing Victoza to 1.8 mg in 3/15 his weight had come down and his blood sugars had improved   Recent history:   INSULIN regimen is described as:  Levemir 24 units hs Non-insulin hypoglycemic drugs the patient is taking are: Metformin 2g, Victoza 1.8 mg daily  His A1c is relatively higher at 7.4, has been as low as 6.7 in 2016  Current management, blood sugar patterns and problems identified:  He has had only a few blood sugars on his monitor despite reminders to check more consistently  Has been very compliant with taking his insulin and Victoza close to bedtime every night  He has not been exercising as directed, occasionally has had cardiac arrhythmia issues  He thinks his diet is fairly good usually but is starting to gain weight  Fasting readings are fairly good but slightly higher in the lab He takes Victoza at hs with his LEVEMIR.    Side effects from medications have been: None Compliance with the medical regimen: Fair   Glucose monitoring: Irregular        Glucometer: One Touch.      Blood Glucose readings   Mean values apply above for all meters except median for One Touch  PRE-MEAL Fasting Lunch Dinner Bedtime Overall  Glucose range: 125-137   123, 159     Mean/median:      127    Hypoglycemia frequency: Never.           Self-care:   Meals: 2/day.  11 am 6 pm.  He is eating out rarely  Oatmeal in am Physical activity: exercise: a little on treadmill         Dietician visit: Most recent: 09/2013                  Wt Readings from Last 3 Encounters:  09/07/16 272 lb (123.4 kg)  08/07/16 277 lb 1.9 oz (125.7 kg)  08/04/16 272 lb (123.4 kg)       Lab Results  Component Value Date   HGBA1C 7.4 (H) 09/03/2016   HGBA1C 7.0 (H) 04/27/2016   HGBA1C 7.1 (H) 01/21/2016   Lab Results  Component Value Date   MICROALBUR 3.5 (H) 04/27/2016   LDLCALC 53 05/29/2015   CREATININE 1.48 09/03/2016   Appointment on 09/03/2016  Component Date Value Ref Range Status  . Hgb A1c MFr Bld 09/03/2016 7.4* 4.6 - 6.5 % Final  . Sodium 09/03/2016 143  135 - 145 mEq/L Final  . Potassium 09/03/2016 4.3  3.5 - 5.1 mEq/L Final  . Chloride 09/03/2016 104  96 - 112 mEq/L Final  . CO2 09/03/2016 30  19 - 32 mEq/L Final  . Glucose, Bld 09/03/2016 140* 70 - 99 mg/dL Final  . BUN  09/03/2016 16  6 - 23 mg/dL Final  . Creatinine, Ser 09/03/2016 1.48  0.40 - 1.50 mg/dL Final  . Calcium 09/03/2016 9.5  8.4 - 10.5 mg/dL Final  . GFR 09/03/2016 61.09  >60.00 mL/min Final     Allergies as of 09/07/2016      Reactions   Niacin Nausea Only   REACTION: upset stomach      Medication List       Accurate as of 09/07/16  3:50 PM. Always use your most recent med list.          acetaminophen 650 MG CR tablet Commonly known as:  TYLENOL Take 650 mg by mouth every 8 (eight) hours as needed for pain.   CARTIA XT 180 MG 24 hr capsule Generic drug:  diltiazem take 1 capsule by mouth once daily   CENTRUM ADULTS Tabs Take 1 tablet by mouth daily.   cilostazol 100 MG tablet Commonly known as:  PLETAL take 1 tablet by mouth twice a day   clobetasol 0.05 % topical foam Commonly known as:  OLUX Apply topically 2 (two) times daily.   collagenase ointment Commonly known as:  SANTYL Apply 1 application topically daily.   ENBREL SURECLICK 50 MG/ML  injection Generic drug:  etanercept Inject 50 mg into the skin 2 (two) times a week. Inject on Sundays & Wednesdays   Fish Oil 1000 MG Caps Take 2 capsules by mouth 2 (two) times daily.   Flax Seed Oil 1000 MG Caps Take 1 capsule by mouth 2 (two) times daily.   Fluticasone-Salmeterol 100-50 MCG/DOSE Aepb Commonly known as:  ADVAIR Inhale 1 puff into the lungs 2 (two) times daily.   furosemide 40 MG tablet Commonly known as:  LASIX Take 2 tablets (80 mg total) by mouth 2 (two) times daily.   LEVEMIR FLEXTOUCH 100 UNIT/ML Pen Generic drug:  Insulin Detemir INJECT 24 UNITS            SUBCUTANEOUSLY DAILY   liraglutide 18 MG/3ML Sopn Commonly known as:  VICTOZA INJECT 0.3ML (=1.'8MG'$ )      SUBCUTANEOUSLY DAILY   losartan 100 MG tablet Commonly known as:  COZAAR take 1 tablet by mouth once daily   metformin 1000 MG (OSM) 24 hr tablet Commonly known as:  FORTAMET Take 1,000 mg by mouth 2 (two) times daily with a meal.   metolazone 2.5 MG tablet Commonly known as:  ZAROXOLYN take one tablet by mouth 30 minutes prior to morning Furosemide dosage as needed once a week for weight gain greater than 5 pounds   metoprolol 100 MG tablet Commonly known as:  LOPRESSOR Take 100 mg by mouth every morning. And take 1 and 1/2 tablets by mouth every evening   mupirocin ointment 2 % Commonly known as:  BACTROBAN Apply 1 application topically 2 (two) times daily as needed (affected areas).   NOVOFINE 32G X 6 MM Misc Generic drug:  Insulin Pen Needle USE 2 DAILY WITH LEVEMIR AND VICTOZA   potassium chloride SA 20 MEQ tablet Commonly known as:  K-DUR,KLOR-CON Take 1 tablet (20 mEq total) by mouth 2 (two) times daily.   rivaroxaban 20 MG Tabs tablet Commonly known as:  XARELTO Take 20 mg by mouth daily with supper.   rosuvastatin 10 MG tablet Commonly known as:  CRESTOR Take 1 tablet (10 mg total) by mouth daily.   tamsulosin 0.4 MG Caps capsule Commonly known as:  FLOMAX Take 0.4  mg by mouth daily.   triamcinolone cream 0.1 %  Commonly known as:  KENALOG Apply 1 application topically 2 (two) times daily as needed (affected area).       Allergies:  Allergies  Allergen Reactions  . Niacin Nausea Only    REACTION: upset stomach    Past Medical History:  Diagnosis Date  . Adenomatous colon polyp   . Arthritis    left hip replacement  . Atrial flutter ([place])    onset  2011. s/p EPS/RFA 12/2011  . Cataract   . Chronic bronchitis   . Contact dermatitis and other eczema due to plants (except food)   . COPD (chronic obstructive pulmonary disease) ([place])   . GERD (gastroesophageal reflux disease)   . Hyperlipidemia   . Hypertension   . LV dysfunction    EF 45-50% 12/2011  . Other peripheral vascular disease(443.89)    bilateral lower extremity  . Tobacco use disorder    dependent  . Transient global amnesia   . Tuberculosis   . Type II diabetes mellitus ([place])     Past Surgical History:  Procedure Laterality Date  . A FLUTTER ABLATION N/A 12/28/2011   Procedure: ABLATION A FLUTTER;  Surgeon: Evans Lance, MD;  Location[facility] CATH LAB;  Service: Cardiovascular;  Laterality: N/A;  . CARDIAC CATHETERIZATION N/A 07/10/2015   Procedure: Left Heart Cath and Coronary Angiography;  Surgeon: Sherren Mocha, MD;  Location: [facility] CV LAB;  Service: Cardiovascular;  Laterality: N/A;  . CARDIAC ELECTROPHYSIOLOGY MAPPING AND ABLATION  03/2010  . CARDIOVERSION N/A 07/13/2016   Procedure: CARDIOVERSION;  Surgeon: Skeet Latch, MD;  Location: [facility];  Service: Cardiovascular;  Laterality: N/A;  . COLONOSCOPY    . colonoscopy with polypectomy  2006 & 2011   Dr [name]  . CYSTOSCOPY  11/2007   Dr Jeffie Pollock  . PARTIAL HIP ARTHROPLASTY Right 01/2000   "Right; replaced ball & stem"  . PARTIAL HIP ARTHROPLASTY Left   . POLYPECTOMY    . TEE WITHOUT CARDIOVERSION  12/28/2011   Procedure: TRANSESOPHAGEAL ECHOCARDIOGRAM (TEE);  Surgeon: Larey Dresser, MD;  Location: [facility]  ENDOSCOPY;  Service: Cardiovascular;  Laterality: N/A;    Family History  Problem Relation Age of Onset  . Stroke Mother 61  . Hypertension Mother   . Diabetes Mother   . Diabetes Paternal Grandmother   . Lung cancer Father     smoker  . Diabetes Brother     MI @ 66  . Diabetes Brother     Hepatitis C  . Throat cancer Paternal Uncle     1/2 uncle  . Heart attack Brother 82  . Crohn's disease Son   . Colon cancer Neg Hx   . Esophageal cancer Neg Hx   . Rectal cancer Neg Hx   . Stomach cancer Neg Hx     Social History:  reports that he has been smoking Cigarettes.  He has a 44.00 pack-year smoking history. He has never used smokeless tobacco. He reports that he drinks alcohol. He reports that he does not use drugs.    Review of Systems       Lipids: He has  low HDL, LDL well controlled with, taking Crestor 10 mg   Lab Results  Component Value Date   CHOL 110 05/29/2015   HDL 38.20 (L) 05/29/2015   LDLCALC 53 05/29/2015   TRIG 96.0 05/29/2015   CHOLHDL 3 05/29/2015              Has occasional  history of Numbness, tingling in the right  first or second toes, no burning in feet, on B6 vitamins OTC Diabetic foot exam in  12/17 showed normal monofilament sensation in the toes and plantar surfaces, no skin lesions or ulcers on the feet and absent pedal pulses   He is on Zaroxolyn and also relatively high doses of Lasix for his CHF and edema  Physical Examination:  BP 128/78   Pulse 85   Ht '5\' 11"'$  (1.803 m)   Wt 272 lb (123.4 kg)   SpO2 91%   BMI 37.94 kg/m     Diabetic Foot Exam - Simple   Simple Foot Form Diabetic Foot exam was performed with the following findings:  Yes 09/07/2016  1:38 PM  Visual Inspection See comments:  Yes Sensation Testing See comments:  Yes Pulse Check See comments:  Yes Comments Pedal pulse is not palpable. Monofilament sensation decreased on the left first toe only Edema present on the ankle area, lower legs and feet have dusky  discoloration      ASSESSMENT:  Diabetes type 2, uncontrolled    See history of present illness for detailed discussion of current diabetes management, blood sugar patterns and problems identified  Currently on maximum dose of Victoza and metformin along with basal insulin  His control is fair with A1c relatively higher at 7.4, previously 7% He has difficulty losing weight because of inability to exercise much, also can do better on his diet, getting some recent weight gain  Fasting blood sugars have mostly been fairly good but higher in the lab Not clear if he sugars maybe higher after evening meal when he is not checking, this may explain his higher A1c   PLAN:  Start checking blood sugars after meals more consistently Start walking when able to Discussed glucose monitoring at various times, need to do more readings after evening meal which he is not doing More consistent walking for exercise Continue Victoza 1.8 mg He will call if he is having consistently high readings at any time   Follow-up in 4 months     Patient Instructions  More sugars after meals esp at [place] 09/07/2016, 3:50 PM   Note: This office note was prepared with Dragon voice recognition system technology. Any transcriptional errors that result from this process are unintentional.

## 2016-09-17 ENCOUNTER — Ambulatory Visit (INDEPENDENT_AMBULATORY_CARE_PROVIDER_SITE_OTHER): Payer: Medicare Other | Admitting: Internal Medicine

## 2016-09-17 ENCOUNTER — Encounter: Payer: Self-pay | Admitting: Internal Medicine

## 2016-09-17 ENCOUNTER — Ambulatory Visit (INDEPENDENT_AMBULATORY_CARE_PROVIDER_SITE_OTHER)
Admission: RE | Admit: 2016-09-17 | Discharge: 2016-09-17 | Disposition: A | Discharge status: Home / Self Care | Payer: Medicare Other | Source: Ambulatory Visit | Attending: Internal Medicine | Admitting: Internal Medicine

## 2016-09-17 ENCOUNTER — Other Ambulatory Visit (INDEPENDENT_AMBULATORY_CARE_PROVIDER_SITE_OTHER): Payer: Medicare Other

## 2016-09-17 VITALS — BP 120/68 | HR 72 | Temp 97.6°F | Resp 14 | Ht 71.0 in | Wt 276.2 lb

## 2016-09-17 DIAGNOSIS — M545 Low back pain, unspecified: Secondary | ICD-10-CM

## 2016-09-17 DIAGNOSIS — G8929 Other chronic pain: Secondary | ICD-10-CM

## 2016-09-17 DIAGNOSIS — I208 Other forms of angina pectoris: Secondary | ICD-10-CM | POA: Diagnosis not present

## 2016-09-17 LAB — COMPREHENSIVE METABOLIC PANEL
ALK PHOS: 49 U/L (ref 39–117)
ALT: 17 U/L (ref 0–53)
AST: 17 U/L (ref 0–37)
Albumin: 4.1 g/dL (ref 3.5–5.2)
BILIRUBIN TOTAL: 0.5 mg/dL (ref 0.2–1.2)
BUN: 14 mg/dL (ref 6–23)
CO2: 28 mEq/L (ref 19–32)
Calcium: 9.1 mg/dL (ref 8.4–10.5)
Chloride: 106 mEq/L (ref 96–112)
Creatinine, Ser: 1.32 mg/dL (ref 0.40–1.50)
GFR: 69.7 mL/min (ref >60.00)
GLUCOSE: 153 mg/dL — AB (ref 70–99)
Potassium: 4.2 mEq/L (ref 3.5–5.1)
SODIUM: 140 meq/L (ref 135–145)
TOTAL PROTEIN: 7.5 g/dL (ref 6.0–8.3)

## 2016-09-17 MED ORDER — ROSUVASTATIN CALCIUM 10 MG PO TABS: 10.0000 mg | ORAL_TABLET | Freq: Every day | ORAL | 3 refills | Status: DC

## 2016-09-17 NOTE — Progress Notes (Signed)
Pre visit review using our clinic review tool, if applicable. No additional management support is needed unless otherwise documented below in the visit note. 

## 2016-09-17 NOTE — Patient Instructions (Signed)
We will check the labs today and check the x-ray of the low back.   We will call you back with the results.

## 2016-09-17 NOTE — Progress Notes (Signed)
   Subjective:    Patient ID: Neil Mam., male    DOB: 08-Jul-1951, 65 y.o.   MRN: [id number]  HPI The patient is a 65 YO man coming in for lower back pain. Comes and goes. Worse with prolonged sitting or standing. Pain does not radiate anywhere. He does take tylenol for joint pains and this helps some with the pain. No recent injury or overuse to the area. No numbness or change to bowel or bladder function.   Review of Systems  Constitutional: Negative for activity change, appetite change, chills, fatigue, fever and unexpected weight change.  Respiratory: Negative.   Cardiovascular: Negative.   Gastrointestinal: Negative.   Musculoskeletal: Positive for back pain. Negative for arthralgias, gait problem, joint swelling and myalgias.  Skin: Negative.       Objective:   Physical Exam  Constitutional: He is oriented to person, place, and time. He appears well-developed and well-nourished.  HENT:  Head: Normocephalic and atraumatic.  Eyes: EOM are normal.  Neck: Normal range of motion.  Cardiovascular: Normal rate and regular rhythm.   Pulmonary/Chest: Effort normal. No respiratory distress. He has no wheezes. He has no rales.  Abdominal: Soft. He exhibits no distension. There is no tenderness. There is no rebound.  Musculoskeletal: He exhibits no edema or tenderness.  Neurological: He is alert and oriented to person, place, and time. Coordination normal.  Skin: Skin is warm and dry.   Vitals:   09/17/16 0901  BP: 120/68  Pulse: 72  Resp: 14  Temp: 97.6 F (36.4 C)  TempSrc: Oral  SpO2: 93%  Weight: 276 lb 4 oz (125.3 kg)  Height: '5\' 11"'$  (1.803 m)      Assessment & Plan:

## 2016-09-17 NOTE — Assessment & Plan Note (Signed)
Suspect from osteoarthritis. Checking lumbar x-ray for extent or occult fracture. No red flag signs. No medication needed for pain taking tylenol with good success.

## 2016-09-23 DIAGNOSIS — I872 Venous insufficiency (chronic) (peripheral): Secondary | ICD-10-CM | POA: Diagnosis not present

## 2016-09-23 DIAGNOSIS — L400 Psoriasis vulgaris: Secondary | ICD-10-CM | POA: Diagnosis not present

## 2016-09-24 ENCOUNTER — Encounter: Payer: Self-pay | Admitting: Cardiology

## 2016-09-28 ENCOUNTER — Encounter: Payer: Self-pay | Admitting: *Deleted

## 2016-10-07 ENCOUNTER — Ambulatory Visit: Payer: Medicare Other | Admitting: Cardiology

## 2016-10-11 ENCOUNTER — Encounter: Payer: Self-pay | Admitting: Cardiology

## 2016-10-13 ENCOUNTER — Ambulatory Visit (INDEPENDENT_AMBULATORY_CARE_PROVIDER_SITE_OTHER): Payer: Medicare Other | Admitting: Cardiology

## 2016-10-13 ENCOUNTER — Encounter: Payer: Self-pay | Admitting: Cardiology

## 2016-10-13 VITALS — BP 101/52 | HR 97 | Ht 71.0 in | Wt 273.4 lb

## 2016-10-13 DIAGNOSIS — I10 Essential (primary) hypertension: Secondary | ICD-10-CM | POA: Diagnosis not present

## 2016-10-13 DIAGNOSIS — G4733 Obstructive sleep apnea (adult) (pediatric): Secondary | ICD-10-CM | POA: Diagnosis not present

## 2016-10-13 DIAGNOSIS — E669 Obesity, unspecified: Secondary | ICD-10-CM

## 2016-10-13 DIAGNOSIS — J449 Chronic obstructive pulmonary disease, unspecified: Secondary | ICD-10-CM | POA: Insufficient documentation

## 2016-10-13 DIAGNOSIS — G4734 Idiopathic sleep related nonobstructive alveolar hypoventilation: Secondary | ICD-10-CM

## 2016-10-13 DIAGNOSIS — J42 Unspecified chronic bronchitis: Secondary | ICD-10-CM

## 2016-10-13 HISTORY — DX: Obstructive sleep apnea (adult) (pediatric): G47.33

## 2016-10-13 NOTE — Progress Notes (Signed)
Cardiology Office Note    Date:  10/13/2016   ID:  Neil Carroll., DOB July 18, 1951, MRN [id number]  PCP:  Hoyt Koch, MD  Cardiologist:  Neil Him, MD   Chief Complaint  Patient presents with  . Hypertension  . Sleep Apnea    History of Present Illness:  Neil Carroll. is a 66 y.o. male with a history of atrial flutter s/p ablation, atrial fibrillation, COPD, GERD, HTN who was referred for sleep study.  He says that he has been snoring for over 60 years.  He says that he was having problems with anxiety at night and was not sleeping well until he was placed on anxiolytics. He underwent PSG which showed mild OSA with an AHI of 9.7/hr with significant oxygen desatruations as low as 76% with documented nocturnal hypoxemia despite CPAP therapy.  He underwent CPAP titration to 13cm H20.[name]  He presents today for evaluation.  He is tolerating his CPAP well.  He started with a full face mask but would tear it off at night so he went to the nasal mask which he tolerates well and feels the pressure is adequate. He also uses O2 at night.  Since starting CPAP he thinks he feels a little more rested in the am.  He does nap daily for about an hour. He denies any significant dry mouth or nose and no nasal congestion.  He is sleeping well and does not think he snores.  He is also on O2 at 2L via CPAP for nocturnal hypoxemia.    Past Medical History:  Diagnosis Date  . Adenomatous colon polyp   . Arthritis    left hip replacement  . Atrial flutter ([place])    onset  2011. s/p EPS/RFA 12/2011  . Cataract   . Chronic bronchitis   . Contact dermatitis and other eczema due to plants (except food)   . COPD (chronic obstructive pulmonary disease) ([place])   . GERD (gastroesophageal reflux disease)   . Hyperlipidemia   . Hypertension   . LV dysfunction    EF 45-50% 12/2011  . OSA (obstructive sleep apnea) 10/13/2016   Mild with AHI 9.7/hr with significant oxygen desaturations as  low as 76% now on CPAP  . Other peripheral vascular disease(443.89)    bilateral lower extremity  . Tobacco use disorder    dependent  . Transient global amnesia   . Tuberculosis   . Type II diabetes mellitus ([place])     Past Surgical History:  Procedure Laterality Date  . A FLUTTER ABLATION N/A 12/28/2011   Procedure: ABLATION A FLUTTER;  Surgeon: Evans Lance, MD;  Location: [facility] CATH LAB;  Service: Cardiovascular;  Laterality: N/A;  . CARDIAC CATHETERIZATION N/A 07/10/2015   Procedure: Left Heart Cath and Coronary Angiography;  Surgeon: Sherren Mocha, MD;  Location: [place] CV LAB;  Service: Cardiovascular;  Laterality: N/A;  . CARDIAC ELECTROPHYSIOLOGY MAPPING AND ABLATION  03/2010  . CARDIOVERSION N/A 07/13/2016   Procedure: CARDIOVERSION;  Surgeon: Skeet Latch, MD;  Location: [place];  Service: Cardiovascular;  Laterality: N/A;  . COLONOSCOPY    . colonoscopy with polypectomy  2006 & 2011   Dr [name]  . CYSTOSCOPY  11/2007   Dr Jeffie Pollock  . PARTIAL HIP ARTHROPLASTY Right 01/2000   "Right; replaced ball & stem"  . PARTIAL HIP ARTHROPLASTY Left   . POLYPECTOMY    . TEE WITHOUT CARDIOVERSION  12/28/2011   Procedure: TRANSESOPHAGEAL ECHOCARDIOGRAM (TEE);  Surgeon: Elby Showers  Aundra Dubin, MD;  Location: [facility] ENDOSCOPY;  Service: Cardiovascular;  Laterality: N/A;    Current Medications: Outpatient Medications Prior to Visit  Medication Sig Dispense Refill  . acetaminophen (TYLENOL) 650 MG CR tablet Take 650 mg by mouth every 8 (eight) hours as needed for pain.    [name] CARTIA XT 180 MG 24 hr capsule take 1 capsule by mouth once daily 90 capsule 3  . cilostazol (PLETAL) 100 MG tablet take 1 tablet by mouth twice a day 180 tablet 1  . clobetasol (OLUX) 0.05 % topical foam Apply topically 2 (two) times daily.   0  . collagenase (SANTYL) ointment Apply 1 application topically daily.    [name] etanercept (ENBREL SURECLICK) 50 MG/ML injection Inject 50 mg into the skin 2 (two) times a week. Inject  on Sundays & Wednesdays    . Flaxseed, Linseed, (FLAX SEED OIL) 1000 MG CAPS Take 1 capsule by mouth 2 (two) times daily.     . Fluticasone-Salmeterol (ADVAIR) 100-50 MCG/DOSE AEPB Inhale 1 puff into the lungs 2 (two) times daily. 180 each 1  . furosemide (LASIX) 40 MG tablet Take 2 tablets (80 mg total) by mouth 2 (two) times daily. 120 tablet 11  . LEVEMIR FLEXTOUCH 100 UNIT/ML Pen INJECT 24 UNITS            SUBCUTANEOUSLY DAILY 30 mL 1  . Liraglutide (VICTOZA) 18 MG/3ML SOPN INJECT 0.3ML (=1.'8MG'$ )      SUBCUTANEOUSLY DAILY 27 mL 1  . losartan (COZAAR) 100 MG tablet take 1 tablet by mouth once daily 30 tablet 10  . metformin (FORTAMET) 1000 MG (OSM) 24 hr tablet Take 1,000 mg by mouth 2 (two) times daily with a meal.    . metolazone (ZAROXOLYN) 2.5 MG tablet take one tablet by mouth 30 minutes prior to morning Furosemide dosage as needed once a week for weight gain greater than 5 pounds 30 tablet 1  . metoprolol (LOPRESSOR) 100 MG tablet Take 100 mg by mouth every morning. And take 1 and 1/2 tablets by mouth every evening    . Multiple Vitamins-Minerals (CENTRUM ADULTS) TABS Take 1 tablet by mouth daily.    . mupirocin ointment (BACTROBAN) 2 % Apply 1 application topically 2 (two) times daily as needed (affected areas).     . NOVOFINE 32G X 6 MM MISC USE 2 DAILY WITH LEVEMIR AND VICTOZA 100 each 5  . Omega-3 Fatty Acids (FISH OIL) 1000 MG CAPS Take 2 capsules by mouth 2 (two) times daily.     . potassium chloride SA (K-DUR,KLOR-CON) 20 MEQ tablet Take 1 tablet (20 mEq total) by mouth 2 (two) times daily. 60 tablet 11  . rivaroxaban (XARELTO) 20 MG TABS tablet Take 20 mg by mouth daily with supper.    . rosuvastatin (CRESTOR) 10 MG tablet Take 1 tablet (10 mg total) by mouth daily. 90 tablet 3  . tamsulosin (FLOMAX) 0.4 MG CAPS capsule Take 0.4 mg by mouth daily.     [name] triamcinolone cream (KENALOG) 0.1 % Apply 1 application topically 2 (two) times daily as needed (affected area).      No  facility-administered medications prior to visit.      Allergies:   Niacin   Social History   Social History  . Marital status: Widowed    Spouse name: N/A  . Number of children: 5  . Years of education: N/A   Occupational History  . security guard    Social History Main Topics  . Smoking status: Current  Every Day Smoker    Packs/day: 1.00    Years: 44.00    Types: Cigarettes  . Smokeless tobacco: Never Used     Comment: started @ age 18, up to 2 ppd;1.25-1.5 ppd as of 11/23/13  . Alcohol use 0.0 oz/week     Comment:  11/23/13 "2-3 drinks per year"  . Drug use: No  . Sexual activity: Not Currently   Other Topics Concern  . None   Social History Narrative  . None     Family History:  The patient's family history includes Crohn's disease in his son; Diabetes in his brother, brother, mother, and paternal grandmother; Heart attack (age of onset: 19) in his brother; Heart attack (age of onset: 68) in his brother; Hepatitis C in his brother; Hypertension in his mother; Lung cancer in his father; Stroke (age of onset: 84) in his mother; Throat cancer in his paternal uncle.   ROS:   Please see the history of present illness.    ROS All other systems reviewed and are negative.  No flowsheet data found.     PHYSICAL EXAM:   VS:  BP (!) 101/52   Pulse 97   Ht '5\' 11"'$  (1.803 m)   Wt 273 lb 6.4 oz (124 kg)   BMI 38.13 kg/m    GEN: Well nourished, well developed, in no acute distress  HEENT: normal  Neck: no JVD, carotid bruits, or masses Cardiac: irregularly irregular; no murmurs, rubs, or gallops,no edema.  Intact distal pulses bilaterally.  Respiratory:  Scattered wheezes and rhonchi GI: soft, nontender, nondistended, + BS MS: no deformity or atrophy  Skin: warm and dry, no rash Neuro:  Alert and Oriented x 3, Strength and sensation are intact Psych: euthymic mood, full affect  Wt Readings from Last 3 Encounters:  10/13/16 273 lb 6.4 oz (124 kg)  09/17/16 276 lb 4 oz  (125.3 kg)  09/07/16 272 lb (123.4 kg)      Studies/Labs Reviewed:   EKG:  EKG is not ordered today.    Recent Labs: 11/15/2015: TSH 0.84 02/25/2016: B Natriuretic Peptide 226.9 02/28/2016: Magnesium 2.4 07/06/2016: Platelets 149 07/13/2016: Hemoglobin 17.3 09/17/2016: ALT 17; BUN 14; Creatinine, Ser 1.32; Potassium 4.2; Sodium 140   Lipid Panel    Component Value Date/Time   CHOL 110 05/29/2015 1116   TRIG 96.0 05/29/2015 1116   HDL 38.20 (L) 05/29/2015 1116   CHOLHDL 3 05/29/2015 1116   VLDL 19.2 05/29/2015 1116   LDLCALC 53 05/29/2015 1116    Additional studies/ records that were reviewed today include:  CPAP download    ASSESSMENT:    1. OSA (obstructive sleep apnea)   2. Essential hypertension   3. Nocturnal hypoxemia   4. Obesity (BMI 30-39.9)   5. Chronic bronchitis, unspecified chronic bronchitis type ([place])      PLAN:  In order of problems listed above:  OSA - the patient is tolerating PAP therapy well without any problems. The PAP download was reviewed today and showed an AHI of 1.7/hr on 13 cm H2O with 70% compliance in using more than 4 hours nightly.  The patient has been using and benefiting from CPAP use and will continue to benefit from therapy.  HTN - BP controlled on current meds. Nocturnal hypoxemia despite CPAP. He is on O2 on 2L via CPAP at night.  I will get an overnight pulse ox on O2 to make sure his nocturnal hypoxemia has resolved.  Obesity - I have encouraged Carroll to get  into a routine exercise program and cut back on carbs and portions.  COPD - He has significant wheezing and rhonchi on exam.  I think he should be evaluated by Pulmonary as he is requiring O2 at night and the COPD may be playing a role in this.      Medication Adjustments/Labs and Tests Ordered: Current medicines are reviewed at length with the patient today.  Concerns regarding medicines are outlined above.  Medication changes, Labs and Tests ordered today are listed in the  Patient Instructions below.  There are no Patient Instructions on file for this visit.   Signed, Neil Him, MD  10/13/2016 11:01 AM    [facility] [place], [place], [place]  [address] Phone: [phone]; Fax: [phone]

## 2016-10-13 NOTE — Patient Instructions (Signed)
Medication Instructions:  Your physician recommends that you continue on your current medications as directed. Please refer to the Current Medication list given to you today.   Labwork: None  Testing/Procedures: Dr. Radford Pax recommends you have an [place] on your CPAP. You will be contacted to schedule this.  Follow-Up: You have been referred to Pulmonary for COPD.  Your physician wants you to follow-up in: 1 year with Dr. Radford Pax. You will receive a reminder letter in the mail two months in advance. If you don't receive a letter, please call our office to schedule the follow-up appointment.   Any Other Special Instructions Will Be Listed Below (If Applicable).     If you need a refill on your cardiac medications before your next appointment, please call your pharmacy.

## 2016-10-19 ENCOUNTER — Encounter: Payer: Self-pay | Admitting: Pulmonary Disease

## 2016-10-19 ENCOUNTER — Ambulatory Visit (INDEPENDENT_AMBULATORY_CARE_PROVIDER_SITE_OTHER): Payer: Medicare Other | Admitting: Pulmonary Disease

## 2016-10-19 VITALS — BP 120/80 | HR 87 | Ht 71.0 in | Wt 268.2 lb

## 2016-10-19 DIAGNOSIS — G4733 Obstructive sleep apnea (adult) (pediatric): Secondary | ICD-10-CM | POA: Diagnosis not present

## 2016-10-19 DIAGNOSIS — G4734 Idiopathic sleep related nonobstructive alveolar hypoventilation: Secondary | ICD-10-CM | POA: Diagnosis not present

## 2016-10-19 DIAGNOSIS — J42 Unspecified chronic bronchitis: Secondary | ICD-10-CM

## 2016-10-19 NOTE — Patient Instructions (Addendum)
Lung function is mildly decreased at 73% You have to quit smoking!  Stay on Advair twice daily-rinse mouth after use  Continue using CPAP Start using oxygen blended into CPAP during sleep  Check oximetry on CPAP and room air

## 2016-10-19 NOTE — Assessment & Plan Note (Signed)
Surprisingly does not have airway obstruction, mostly restriction on his spirometry  Lung function is mildly decreased at 73% You have to quit smoking!  Stay on Advair twice daily-rinse mouth after use

## 2016-10-19 NOTE — Assessment & Plan Note (Signed)
Continue on CPAP 13 cm  Compliance emphasized

## 2016-10-19 NOTE — Progress Notes (Signed)
Subjective:    Patient ID: Neil Carroll., male    DOB: [birth year], 66 y.o.   MRN: [id number]  HPI Chief Complaint  Patient presents with  . Pulmonary Consult    referred by Dr. Fransico Him for consult for ?COPD and chronic bronchitis, coughing with dark mucus    66 year old heavy smoker with chronic diastolic heart failure is referred by cardiology for evaluation of nocturnal hypoxia and COPD. He has chronic atrial fibrillation, underwent DC CV in 06/2016.[name] He was hospitalized for acute pulmonary edema and 02/2016. Echo 01/2016 shows normal LVEF with RVSP 39 05/2016 PSG showed mild OSA with AHI 10/hour with lowest desaturation of 76%-on a subsequent titration study, CPAP was titrated to 13 cm with good control of events and he needed 2 L of oxygen to correct for nocturnal hypoxia On a subsequent office visit, download showed good control of events at this level of CPAP. He reports that his daytime somnolence and fatigue are only mildly improved after starting his CPAP. He changed from full face mask to nasal mask due to comfort and has been unable to connect his oxygen line to the nasal mask, hence he has been sleeping without oxygen for the past several weeks.  He reports a diagnosis of COPD about 10 years ago when he was placed on Advair by Dr. Linna Darner. He smokes about 1.5 packs per day for many years-more than 50 pack years. He reports bipedal chronic edema but denies wheezing. He is limited as much by his circulation in his legs as by his breathing. He can climb stairs at his own pace. He denies frequent chest colds. He was taken off Spiriva by his cardiologist sometime back. He is maintained on Enbrel for psoriasis Spirometry surprisingly showed no evidence of airway obstruction with a ratio of 82 and FEV1 of 73% FVC of 68% suggestive of moderate restriction.     Past Medical History:  Diagnosis Date  . Adenomatous colon polyp   . Arthritis    left hip replacement  .  Atrial flutter ([place])    onset  2011. s/p EPS/RFA 12/2011  . Cataract   . Chronic bronchitis   . Contact dermatitis and other eczema due to plants (except food)   . COPD (chronic obstructive pulmonary disease) ([place])   . GERD (gastroesophageal reflux disease)   . Hyperlipidemia   . Hypertension   . LV dysfunction    EF 45-50% 12/2011  . OSA (obstructive sleep apnea) 10/13/2016   Mild with AHI 9.7/hr with significant oxygen desaturations as low as 76% now on CPAP  . Other peripheral vascular disease(443.89)    bilateral lower extremity  . Tobacco use disorder    dependent  . Transient global amnesia   . Tuberculosis   . Type II diabetes mellitus ([place])       Past Surgical History:  Procedure Laterality Date  . A FLUTTER ABLATION N/A 12/28/2011   Procedure: ABLATION A FLUTTER;  Surgeon: Evans Lance, MD;  Location: [facility] CATH LAB;  Service: Cardiovascular;  Laterality: N/A;  . CARDIAC CATHETERIZATION N/A 07/10/2015   Procedure: Left Heart Cath and Coronary Angiography;  Surgeon: Sherren Mocha, MD;  Location: [place] CV LAB;  Service: Cardiovascular;  Laterality: N/A;  . CARDIAC ELECTROPHYSIOLOGY MAPPING AND ABLATION  03/2010  . CARDIOVERSION N/A 07/13/2016   Procedure: CARDIOVERSION;  Surgeon: Skeet Latch, MD;  Location: [place];  Service: Cardiovascular;  Laterality: N/A;  . COLONOSCOPY    . colonoscopy with polypectomy  2006 & 2011   Dr [name]  . CYSTOSCOPY  11/2007   Dr Jeffie Pollock  . PARTIAL HIP ARTHROPLASTY Right 01/2000   "Right; replaced ball & stem"  . PARTIAL HIP ARTHROPLASTY Left   . POLYPECTOMY    . TEE WITHOUT CARDIOVERSION  12/28/2011   Procedure: TRANSESOPHAGEAL ECHOCARDIOGRAM (TEE);  Surgeon: Larey Dresser, MD;  Location: [place];  Service: Cardiovascular;  Laterality: N/A;    Allergies  Allergen Reactions  . Niacin Nausea Only    REACTION: upset stomach    Social History   Social History  . Marital status: Widowed    Spouse name: N/A  . Number  of children: 5  . Years of education: N/A   Occupational History  . security guard    Social History Main Topics  . Smoking status: Current Every Day Smoker    Packs/day: 1.00    Years: 44.00    Types: Cigarettes  . Smokeless tobacco: Never Used     Comment: started @ age 81, up to 2 ppd;1.25-1.5 ppd as of 11/23/13  . Alcohol use 0.0 oz/week     Comment:  11/23/13 "2-3 drinks per year"  . Drug use: No  . Sexual activity: Not Currently   Other Topics Concern  . Not on file   Social History Narrative  . No narrative on file     Family History  Problem Relation Age of Onset  . Stroke Mother 30  . Hypertension Mother   . Diabetes Mother   . Diabetes Paternal Grandmother   . Lung cancer Father     smoker  . Diabetes Brother   . Heart attack Brother 56  . Diabetes Brother   . Hepatitis C Brother   . Throat cancer Paternal Uncle     1/2 uncle  . Heart attack Brother 32  . Crohn's disease Son   . Colon cancer Neg Hx   . Esophageal cancer Neg Hx   . Rectal cancer Neg Hx   . Stomach cancer Neg Hx      Review of Systems Constitutional: negative for anorexia, fevers and sweats  Eyes: negative for irritation, redness and visual disturbance  Ears, nose, mouth, throat, and face: negative for earaches, epistaxis, nasal congestion and sore throat  Respiratory: negative for cough,  sputum and wheezing  Cardiovascular: negative for chest pain, dyspnea,  orthopnea, palpitations and syncope  Gastrointestinal: negative for abdominal pain, constipation, diarrhea, melena, nausea and vomiting  Genitourinary:negative for dysuria, frequency and hematuria  Hematologic/lymphatic: negative for bleeding, easy bruising and lymphadenopathy  Musculoskeletal:negative for arthralgias, muscle weakness and stiff joints  Neurological: negative for coordination problems, , headaches and weakness  Endocrine: negative for diabetic symptoms including polydipsia, polyuria and weight loss       Objective:   Physical Exam  Gen. Pleasant, obese, in no distress, normal affect ENT - no lesions, no post nasal drip, class 2-3 airway Neck: No JVD, no thyromegaly, no carotid bruits Lungs: no use of accessory muscles, no dullness to percussion, decreased without rales or rhonchi  Cardiovascular: Rhythm regular, heart sounds  normal, no murmurs or gallops, no peripheral edema Abdomen: soft and non-tender, no hepatosplenomegaly, BS normal. Musculoskeletal: No deformities, no cyanosis or clubbing Neuro:  alert, non focal, no tremors       Assessment & Plan:

## 2016-10-19 NOTE — Assessment & Plan Note (Signed)
   Check oximetry on CPAP and room air It is conceivable that nocturnal desaturations were due to fluid overload and should be improved by now

## 2016-10-21 ENCOUNTER — Other Ambulatory Visit: Payer: Self-pay | Admitting: Endocrinology

## 2016-10-22 ENCOUNTER — Other Ambulatory Visit: Payer: Self-pay | Admitting: Internal Medicine

## 2016-10-22 ENCOUNTER — Telehealth: Payer: Self-pay

## 2016-10-22 MED ORDER — FLUTICASONE-SALMETEROL 100-50 MCG/DOSE IN AEPB: 1.0000 | INHALATION_SPRAY | Freq: Two times a day (BID) | RESPIRATORY_TRACT | 1 refills | Status: DC

## 2016-10-22 NOTE — Telephone Encounter (Signed)
Medication refill for advair

## 2016-10-23 ENCOUNTER — Telehealth: Payer: Self-pay | Admitting: *Deleted

## 2016-10-23 NOTE — Telephone Encounter (Signed)
Patient ONO scheduled for November 03, 2016

## 2016-10-23 NOTE — Telephone Encounter (Signed)
-----   Message from Freada Bergeron, House sent at 10/13/2016  1:19 PM EST ----- Regarding: over night pulse oximetry ONO order is in for scheduling , check to see when it gets scheduled, sent today

## 2016-10-25 ENCOUNTER — Other Ambulatory Visit: Payer: Self-pay | Admitting: Endocrinology

## 2016-10-25 MED ORDER — METFORMIN HCL ER 500 MG PO TB24: 2000.0000 mg | ORAL_TABLET | Freq: Every day | ORAL | 3 refills | Status: DC

## 2016-10-27 ENCOUNTER — Ambulatory Visit (INDEPENDENT_AMBULATORY_CARE_PROVIDER_SITE_OTHER): Payer: Medicare Other | Admitting: Cardiology

## 2016-10-27 ENCOUNTER — Encounter: Payer: Self-pay | Admitting: Cardiology

## 2016-10-27 VITALS — BP 110/66 | HR 98 | Ht 71.0 in | Wt 273.0 lb

## 2016-10-27 DIAGNOSIS — I481 Persistent atrial fibrillation: Secondary | ICD-10-CM

## 2016-10-27 DIAGNOSIS — N401 Enlarged prostate with lower urinary tract symptoms: Secondary | ICD-10-CM | POA: Diagnosis not present

## 2016-10-27 DIAGNOSIS — I4819 Other persistent atrial fibrillation: Secondary | ICD-10-CM

## 2016-10-27 NOTE — Patient Instructions (Addendum)
Medication Instructions:  Your physician recommends that you continue on your current medications as directed. Please refer to the Current Medication list given to you today.  If you need a refill on your cardiac medications before your next appointment, please call your pharmacy.   Labwork: None ordered  Testing/Procedures: None ordered  Follow-Up: Your physician wants you to follow-up in: 6 months with Dr. Camnitz.  You will receive a reminder letter in the mail two months in advance. If you don't receive a letter, please call our office to schedule the follow-up appointment.  Thank you for choosing [facility]!!   Kaeo Jacome, RN [phone]         

## 2016-10-27 NOTE — Progress Notes (Signed)
Electrophysiology Office Note   Date:  10/27/2016   ID:  Neil Embleton., DOB [birth year], MRN [id number]  PCP:  Hoyt Koch, MD  Cardiologist:  Burt Knack Primary Electrophysiologist:  Will Meredith Leeds, MD    Chief Complaint  Patient presents with  . Follow-up    PAF/Typical Aflutter     History of Present Illness: Neil Rivet. is a 66 y.o. male who presents today for electrophysiology evaluation.   He has a history of atrial flutter status post redo frequency ablation, peripheral arterial disease with known SFA occlusive disease bilaterally, HTN, DM, COPD, tobacco use and CAD. He recently underwent a [place] by Dr. Burt Knack 06/2015. This showed severe distal left circumflex stenosis - recommend medical therapy as small amount of myocardium supplied, mild nonobstructive stenosis of a large, wrap around LAD and total occlusion of a small, codominant RCA. Dr. Burt Knack did not feel that PCI would significantly impact symptoms or overall clinical outcome, thus medical therapy was elected. He has been maintained on Plavix, metoprolol, Crestor and Losartan. He also takes Pletal for his PVD. Was diagnosed with atrial flutter after an ER visit and atrial fibrillation after event monitor. Had cardioversion to sinus rhythm 07/13/16.   Today, he denies symptoms of palpitations, orthopnea, PND, claudication, dizziness, presyncope, syncope, bleeding, or neurologic sequela. The patient is tolerating medications without difficulties and is otherwise without complaint today.  He does continue to have significant shortness of breath when walking 3-400 yards. He otherwise has no major complaints.He says that he had PFTs done by his pulmonologist and no medication changes were made.   Past Medical History:  Diagnosis Date  . Adenomatous colon polyp   . Arthritis    left hip replacement  . Atrial flutter ([place])    onset  2011. s/p EPS/RFA 12/2011  . Cataract   . Chronic bronchitis   .  Contact dermatitis and other eczema due to plants (except food)   . COPD (chronic obstructive pulmonary disease) ([place])   . GERD (gastroesophageal reflux disease)   . Hyperlipidemia   . Hypertension   . LV dysfunction    EF 45-50% 12/2011  . OSA (obstructive sleep apnea) 10/13/2016   Mild with AHI 9.7/hr with significant oxygen desaturations as low as 76% now on CPAP  . Other peripheral vascular disease(443.89)    bilateral lower extremity  . Tobacco use disorder    dependent  . Transient global amnesia   . Tuberculosis   . Type II diabetes mellitus ([place])    Past Surgical History:  Procedure Laterality Date  . A FLUTTER ABLATION N/A 12/28/2011   Procedure: ABLATION A FLUTTER;  Surgeon: Evans Lance, MD;  Location: [facility] CATH LAB;  Service: Cardiovascular;  Laterality: N/A;  . CARDIAC CATHETERIZATION N/A 07/10/2015   Procedure: Left Heart Cath and Coronary Angiography;  Surgeon: Sherren Mocha, MD;  Location: [facility] CV LAB;  Service: Cardiovascular;  Laterality: N/A;  . CARDIAC ELECTROPHYSIOLOGY MAPPING AND ABLATION  03/2010  . CARDIOVERSION N/A 07/13/2016   Procedure: CARDIOVERSION;  Surgeon: Skeet Latch, MD;  Location: [facility];  Service: Cardiovascular;  Laterality: N/A;  . COLONOSCOPY    . colonoscopy with polypectomy  2006 & 2011   Dr [name]  . CYSTOSCOPY  11/2007   Dr Jeffie Pollock  . PARTIAL HIP ARTHROPLASTY Right 01/2000   "Right; replaced ball & stem"  . PARTIAL HIP ARTHROPLASTY Left   . POLYPECTOMY    . TEE WITHOUT CARDIOVERSION  12/28/2011   Procedure:  TRANSESOPHAGEAL ECHOCARDIOGRAM (TEE);  Surgeon: Larey Dresser, MD;  Location: [facility] ENDOSCOPY;  Service: Cardiovascular;  Laterality: N/A;     Current Outpatient Prescriptions  Medication Sig Dispense Refill  . acetaminophen (TYLENOL) 650 MG CR tablet Take 650 mg by mouth every 8 (eight) hours as needed for pain.    [name] CARTIA XT 180 MG 24 hr capsule take 1 capsule by mouth once daily 90 capsule 3  . cilostazol (PLETAL)  100 MG tablet take 1 tablet by mouth twice a day 180 tablet 1  . clobetasol (OLUX) 0.05 % topical foam Apply topically 2 (two) times daily.   0  . collagenase (SANTYL) ointment Apply 1 application topically daily.    [name] etanercept (ENBREL SURECLICK) 50 MG/ML injection Inject 50 mg into the skin 2 (two) times a week. Inject on Sundays & Wednesdays    . Flaxseed, Linseed, (FLAX SEED OIL) 1000 MG CAPS Take 1 capsule by mouth 2 (two) times daily.     . Fluticasone-Salmeterol (ADVAIR) 100-50 MCG/DOSE AEPB Inhale 1 puff into the lungs 2 (two) times daily. 180 each 1  . furosemide (LASIX) 40 MG tablet Take 2 tablets (80 mg total) by mouth 2 (two) times daily. 120 tablet 11  . LEVEMIR FLEXTOUCH 100 UNIT/ML Pen INJECT 24 UNITS            SUBCUTANEOUSLY DAILY 30 mL 1  . LEVEMIR FLEXTOUCH 100 UNIT/ML Pen INJECT 24 UNITS            SUBCUTANEOUSLY DAILY 30 mL 1  . Liraglutide (VICTOZA) 18 MG/3ML SOPN INJECT 0.3ML (=1.'8MG'$ )      SUBCUTANEOUSLY DAILY 27 mL 1  . losartan (COZAAR) 100 MG tablet take 1 tablet by mouth once daily 30 tablet 10  . metFORMIN (GLUCOPHAGE) 1000 MG tablet Take 1,000 mg by mouth 2 (two) times daily with a meal.    . metolazone (ZAROXOLYN) 2.5 MG tablet take one tablet by mouth 30 minutes prior to morning Furosemide dosage as needed once a week for weight gain greater than 5 pounds 30 tablet 1  . metoprolol (LOPRESSOR) 100 MG tablet Take 100 mg by mouth every morning. And take 1 and 1/2 tablets by mouth every evening    . Multiple Vitamins-Minerals (CENTRUM ADULTS) TABS Take 1 tablet by mouth daily.    . mupirocin ointment (BACTROBAN) 2 % Apply 1 application topically 2 (two) times daily as needed (affected areas).     . NOVOFINE 32G X 6 MM MISC USE 2 DAILY WITH LEVEMIR AND VICTOZA 100 each 5  . Omega-3 Fatty Acids (FISH OIL) 1000 MG CAPS Take 2 capsules by mouth 2 (two) times daily.     . potassium chloride SA (K-DUR,KLOR-CON) 20 MEQ tablet Take 1 tablet (20 mEq total) by mouth 2 (two) times  daily. 60 tablet 11  . rivaroxaban (XARELTO) 20 MG TABS tablet Take 20 mg by mouth daily with supper.    . rosuvastatin (CRESTOR) 10 MG tablet Take 1 tablet (10 mg total) by mouth daily. 90 tablet 3  . tamsulosin (FLOMAX) 0.4 MG CAPS capsule Take 0.4 mg by mouth daily.     [name] triamcinolone cream (KENALOG) 0.1 % Apply 1 application topically 2 (two) times daily as needed (affected area).      No current facility-administered medications for this visit.     Allergies:   Niacin   Social History:  The patient  reports that he has been smoking Cigarettes.  He has a 44.00 pack-year smoking history. He  has never used smokeless tobacco. He reports that he drinks alcohol. He reports that he does not use drugs.   Family History:  The patient's family history includes Crohn's disease in his son; Diabetes in his brother, brother, mother, and paternal grandmother; Heart attack (age of onset: 39) in his brother; Heart attack (age of onset: 72) in his brother; Hepatitis C in his brother; Hypertension in his mother; Lung cancer in his father; Stroke (age of onset: 24) in his mother; Throat cancer in his paternal uncle.    ROS:  Please see the history of present illness.   Otherwise, review of systems is positive for leg swelling, leg pain, palpitations, cough, DOE, wheezing, back pain, rash, easy bruising, bleeding.   All other systems are reviewed and negative.    PHYSICAL EXAM: VS:  BP 110/66   Pulse 98   Ht '5\' 11"'$  (1.803 m)   Wt 273 lb (123.8 kg)   SpO2 92%   BMI 38.08 kg/m  , BMI Body mass index is 38.08 kg/m. GEN: Well nourished, well developed, in no acute distress  HEENT: normal  Neck: no JVD, carotid bruits, or masses Cardiac: tachycardic, irregular; no murmurs, rubs, or gallops, 2-3+ edema to the knee  Respiratory:  Course throughout GI: soft, nontender, nondistended, + BS MS: no deformity or atrophy  Skin: warm and dry Neuro:  Strength and sensation are intact Psych: euthymic mood, full  affect  EKG:  EKG is ordered today. Personal review of the ECG ordered 07/22/16 shows atrial fibrillation, QTc 437  Recent Labs: 11/15/2015: TSH 0.84 02/25/2016: B Natriuretic Peptide 226.9 02/28/2016: Magnesium 2.4 07/06/2016: Platelets 149 07/13/2016: Hemoglobin 17.3 09/17/2016: ALT 17; BUN 14; Creatinine, Ser 1.32; Potassium 4.2; Sodium 140    Lipid Panel     Component Value Date/Time   CHOL 110 05/29/2015 1116   TRIG 96.0 05/29/2015 1116   HDL 38.20 (L) 05/29/2015 1116   CHOLHDL 3 05/29/2015 1116   VLDL 19.2 05/29/2015 1116   LDLCALC 53 05/29/2015 1116     Wt Readings from Last 3 Encounters:  10/27/16 273 lb (123.8 kg)  10/19/16 268 lb 3.2 oz (121.7 kg)  10/13/16 273 lb 6.4 oz (124 kg)      Other studies Reviewed: Additional studies/ records that were reviewed today include: 30 day monitor 11/20/15  Review of the above records today demonstrates:  Sinus rhythm and atrial fibrillation No SVT or VT noted.  TTE 02/2016 - Left ventricle: Global longitudinal LV strain is calculated at   -11.7% but is inaccurate due to poor tracking. The cavity size   was normal. There was moderate concentric hypertrophy. Systolic   function was normal. The estimated ejection fraction was in the   range of 55% to 60%. Wall motion was normal; there were no   regional wall motion abnormalities. Normal sinus rhythm was   absent. The study is not technically sufficient to allow   evaluation of LV diastolic function. - Aortic valve: Poorly visualized. Trileaflet; normal thickness,   mildly calcified leaflets. There was mild regurgitation. - Mitral valve: Calcified annulus. There was mild regurgitation. - Tricuspid valve: There was mild regurgitation.  ASSESSMENT AND PLAN:  1.  Atrial fibrillation/atypical atrial flutter: Has had both atrial fibrillation and what appears to be atypical atrial flutter over the last few months. He is currently anticoagulated on rivaroxaban. He is still smoking at  this time does not have any interest in quitting. He is back in atrial fibrillation after his cardioversion. He said  that he was in sinus rhythm for approximately one week. He did not notice any change in his overall health when he was in sinus rhythm. He will continue a rate control strategy at this time. Should he become symptomatic from his atrial fibrillation, we'll discuss the potential for dofetilide.  This patients CHA2DS2-VASc Score and unadjusted Ischemic Stroke Rate (% per year) is equal to 4.8 % stroke rate/year from a score of 4  Above score calculated as 1 point each if present [CHF, HTN, DM, Vascular=MI/PAD/Aortic Plaque, Age if 65-74, or Male] Above score calculated as 2 points each if present [Age > 75, or Stroke/TIA/TE]  2. Hypertension: well controlled  3. Hyperlipidemia: on Crestor  4. diastolic heart failure: Appears to be well compensated.  Current medicines are reviewed at length with the patient today.   The patient does not have concerns regarding his medicines.  The following changes were made today:  Refill Cardizem, increase Lasix for one week  Labs/ tests ordered today include:  No orders of the defined types were placed in this encounter.    Disposition:   FU with Will Camnitz 6 months  Signed, Will Meredith Leeds, MD  10/27/2016 9:44 AM     [facility] [address] [place] [address] [phone] (office) [phone] (fax)

## 2016-11-11 DIAGNOSIS — N5201 Erectile dysfunction due to arterial insufficiency: Secondary | ICD-10-CM | POA: Diagnosis not present

## 2016-11-11 DIAGNOSIS — R351 Nocturia: Secondary | ICD-10-CM | POA: Diagnosis not present

## 2016-11-11 DIAGNOSIS — N401 Enlarged prostate with lower urinary tract symptoms: Secondary | ICD-10-CM | POA: Diagnosis not present

## 2016-11-11 DIAGNOSIS — A630 Anogenital (venereal) warts: Secondary | ICD-10-CM | POA: Diagnosis not present

## 2016-11-13 ENCOUNTER — Telehealth: Payer: Self-pay | Admitting: *Deleted

## 2016-11-13 DIAGNOSIS — G4733 Obstructive sleep apnea (adult) (pediatric): Secondary | ICD-10-CM

## 2016-11-13 NOTE — Telephone Encounter (Signed)
Oxygen Prescription faxed to [facility]  [phone]  11/13/16 Repeat overnight pulse ox on 4 L [place] and cpap

## 2016-11-18 DIAGNOSIS — L602 Onychogryphosis: Secondary | ICD-10-CM | POA: Diagnosis not present

## 2016-11-18 DIAGNOSIS — B353 Tinea pedis: Secondary | ICD-10-CM | POA: Diagnosis not present

## 2016-11-18 DIAGNOSIS — I70293 Other atherosclerosis of native arteries of extremities, bilateral legs: Secondary | ICD-10-CM | POA: Diagnosis not present

## 2016-11-18 DIAGNOSIS — I872 Venous insufficiency (chronic) (peripheral): Secondary | ICD-10-CM | POA: Diagnosis not present

## 2016-11-18 DIAGNOSIS — E1351 Other specified diabetes mellitus with diabetic peripheral angiopathy without gangrene: Secondary | ICD-10-CM | POA: Diagnosis not present

## 2016-11-19 ENCOUNTER — Other Ambulatory Visit: Payer: Self-pay | Admitting: Internal Medicine

## 2016-11-20 ENCOUNTER — Ambulatory Visit (INDEPENDENT_AMBULATORY_CARE_PROVIDER_SITE_OTHER): Payer: Medicare Other | Admitting: Physician Assistant

## 2016-11-20 ENCOUNTER — Other Ambulatory Visit: Payer: Medicare Other

## 2016-11-20 ENCOUNTER — Encounter (INDEPENDENT_AMBULATORY_CARE_PROVIDER_SITE_OTHER): Payer: Self-pay

## 2016-11-20 ENCOUNTER — Encounter: Payer: Self-pay | Admitting: Physician Assistant

## 2016-11-20 VITALS — BP 100/60 | HR 86 | Ht 71.0 in | Wt 274.0 lb

## 2016-11-20 DIAGNOSIS — G4733 Obstructive sleep apnea (adult) (pediatric): Secondary | ICD-10-CM

## 2016-11-20 DIAGNOSIS — I25709 Atherosclerosis of coronary artery bypass graft(s), unspecified, with unspecified angina pectoris: Secondary | ICD-10-CM

## 2016-11-20 DIAGNOSIS — N182 Chronic kidney disease, stage 2 (mild): Secondary | ICD-10-CM | POA: Diagnosis not present

## 2016-11-20 DIAGNOSIS — J42 Unspecified chronic bronchitis: Secondary | ICD-10-CM | POA: Diagnosis not present

## 2016-11-20 DIAGNOSIS — I481 Persistent atrial fibrillation: Secondary | ICD-10-CM | POA: Diagnosis not present

## 2016-11-20 DIAGNOSIS — I5033 Acute on chronic diastolic (congestive) heart failure: Secondary | ICD-10-CM | POA: Diagnosis not present

## 2016-11-20 DIAGNOSIS — R55 Syncope and collapse: Secondary | ICD-10-CM | POA: Diagnosis not present

## 2016-11-20 DIAGNOSIS — I110 Hypertensive heart disease with heart failure: Secondary | ICD-10-CM

## 2016-11-20 DIAGNOSIS — I209 Angina pectoris, unspecified: Secondary | ICD-10-CM

## 2016-11-20 DIAGNOSIS — I4819 Other persistent atrial fibrillation: Secondary | ICD-10-CM

## 2016-11-20 DIAGNOSIS — I5032 Chronic diastolic (congestive) heart failure: Secondary | ICD-10-CM | POA: Diagnosis not present

## 2016-11-20 LAB — CBC
HEMATOCRIT: 49.3 % (ref 37.5–51.0)
HEMOGLOBIN: 16.7 g/dL (ref 13.0–17.7)
MCH: 30.8 pg (ref 26.6–33.0)
MCHC: 33.9 g/dL (ref 31.5–35.7)
MCV: 91 fL (ref 79–97)
Platelets: 145 10*3/uL — ABNORMAL LOW (ref 150–379)
RBC: 5.43 x10E6/uL (ref 4.14–5.80)
RDW: 14.2 % (ref 12.3–15.4)
WBC: 8.6 10*3/uL (ref 3.4–10.8)

## 2016-11-20 LAB — BASIC METABOLIC PANEL
BUN/Creatinine Ratio: 9 — ABNORMAL LOW (ref 10–24)
BUN: 10 mg/dL (ref 8–27)
CALCIUM: 8.9 mg/dL (ref 8.6–10.2)
CO2: 22 mmol/L (ref 18–29)
Chloride: 100 mmol/L (ref 96–106)
Creatinine, Ser: 1.17 mg/dL (ref 0.76–1.27)
GFR, EST AFRICAN AMERICAN: 75 mL/min/{1.73_m2} (ref >59)
GFR, EST NON AFRICAN AMERICAN: 65 mL/min/{1.73_m2} (ref >59)
Glucose: 110 mg/dL — ABNORMAL HIGH (ref 65–99)
POTASSIUM: 4.2 mmol/L (ref 3.5–5.2)
Sodium: 142 mmol/L (ref 134–144)

## 2016-11-20 LAB — PRO B NATRIURETIC PEPTIDE: NT-PRO BNP: 634 pg/mL — AB (ref 0–376)

## 2016-11-20 MED ORDER — METOLAZONE 2.5 MG PO TABS: 2.5000 mg | ORAL_TABLET | ORAL | 1 refills | Status: DC

## 2016-11-20 MED ORDER — LOSARTAN POTASSIUM 50 MG PO TABS: 50.0000 mg | ORAL_TABLET | Freq: Every day | ORAL | 3 refills | Status: DC

## 2016-11-20 NOTE — Patient Instructions (Addendum)
Medication Instructions:  1. DECREASE LOSARTAN TO 50 MG DAILY 2. INCREASE METOLAZONE 2.5 MG TO TWO DAYS A WEEK  Labwork: 1. TODAY BMET, CBC , BNP 2. IN 2 WEEK REPEAT  BMET NEEDED  Testing/Procedures: 1. Your physician has requested that you have an LIMITED echocardiogram. Echocardiography is a painless test that uses sound waves to create images of your heart. It provides your doctor with information about the size and shape of your heart and how well your heart's chambers and valves are working. This procedure takes approximately one hour. There are no restrictions for this procedure.  2. Your physician has recommended that you wear an event monitor. Event monitors are medical devices that record the heart's electrical activity. Doctors most often [name] these monitors to diagnose arrhythmias. Arrhythmias are problems with the speed or rhythm of the heartbeat. The monitor is a small, portable device. You can wear one while you do your normal daily activities. This is usually used to diagnose what is causing palpitations/syncope (passing out).  Follow-Up: Neil Carroll,M PAC IN 6 WEEKS SAME DAY DR. Burt Knack IS IN THE OFFICE PLEASE  Any Other Special Instructions Will Be Listed Below (If Applicable). 1. STOP SMOKING  2. NO DRIVING UNTIL FURTHER ASSESS BY CARDIOLOGY 3. FOLLOW A LOW SALT DIET  If you need a refill on your cardiac medications before your next appointment, please call your pharmacy.   Low-Sodium Eating Plan Sodium, which is an element that makes up salt, helps you maintain a healthy balance of fluids in your body. Too much sodium can increase your blood pressure and cause fluid and waste to be held in your body. Your health care provider or dietitian may recommend following this plan if you have high blood pressure (hypertension), kidney disease, liver disease, or heart failure. Eating less sodium can help lower your blood pressure, reduce swelling, and protect your heart, liver, and  kidneys. What are tips for following this plan? General guidelines   Most people on this plan should limit their sodium intake to 1,500-2,000 mg (milligrams) of sodium each day. Reading food labels   The Nutrition Facts label lists the amount of sodium in one serving of the food. If you eat more than one serving, you must multiply the listed amount of sodium by the number of servings.  Choose foods with less than 140 mg of sodium per serving.  -Avoid foods with 300 mg of sodium or more per serving. Shopping   Look for lower-sodium products, often labeled as "low-sodium" or "no salt added."  Always check the sodium content even if foods are labeled as "unsalted" or "no salt added".  Buy fresh foods.  Avoid canned foods and premade or frozen meals.  Avoid canned, cured, or processed meats  Buy breads that have less than 80 mg of sodium per slice. Cooking   Eat more home-cooked food and less restaurant, buffet, and fast food.  Avoid adding salt when cooking. Use salt-free seasonings or herbs instead of table salt or sea salt. Check with your health care provider or pharmacist before using salt substitutes.  Cook with plant-based oils, such as canola, sunflower, or olive oil. Meal planning   When eating at a restaurant, ask that your food be prepared with less salt or no salt, if possible.  Avoid foods that contain MSG (monosodium glutamate). MSG is sometimes added to [place] food, bouillon, and some canned foods. What foods are recommended? The items listed may not be a complete list. Talk with your dietitian  about what dietary choices are best for you. Grains  Low-sodium cereals, including oats, puffed wheat and rice, and shredded wheat. Low-sodium crackers. Unsalted rice. Unsalted pasta. Low-sodium bread. Whole-grain breads and whole-grain pasta. Vegetables  Fresh or frozen vegetables. "No salt added" canned vegetables. "No salt added" tomato sauce and paste. Low-sodium or  reduced-sodium tomato and vegetable juice. Fruits  Fresh, frozen, or canned fruit. Fruit juice. Meats and other protein foods  Fresh or frozen (no salt added) meat, poultry, seafood, and fish. Low-sodium canned tuna and salmon. Unsalted nuts. Dried peas, beans, and lentils without added salt. Unsalted canned beans. Eggs. Unsalted nut butters. Dairy  Milk. Soy milk. Cheese that is naturally low in sodium, such as ricotta cheese, fresh mozzarella, or Swiss cheese Low-sodium or reduced-sodium cheese. Cream cheese. Yogurt. Fats and oils  Unsalted butter. Unsalted margarine with no trans fat. Vegetable oils such as canola or olive oils. Seasonings and other foods  Fresh and dried herbs and spices. Salt-free seasonings. Low-sodium mustard and ketchup. Sodium-free salad dressing. Sodium-free light mayonnaise. Fresh or refrigerated horseradish. Lemon juice. Vinegar. Homemade, reduced-sodium, or low-sodium soups. Unsalted popcorn and pretzels. Low-salt or salt-free chips. What foods are not recommended? The items listed may not be a complete list. Talk with your dietitian about what dietary choices are best for you. Grains  Instant hot cereals. Bread stuffing, pancake, and biscuit mixes. Croutons. Seasoned rice or pasta mixes. Noodle soup cups. Boxed or frozen macaroni and cheese. Regular salted crackers. Self-rising flour. Vegetables  Sauerkraut, pickled vegetables, and relishes. Olives. [place] fries. Onion rings. Regular canned vegetables (not low-sodium or reduced-sodium). Regular canned tomato sauce and paste (not low-sodium or reduced-sodium). Regular tomato and vegetable juice (not low-sodium or reduced-sodium). Frozen vegetables in sauces. Meats and other protein foods  Meat or fish that is salted, canned, smoked, spiced, or pickled. Bacon, ham, sausage, hotdogs, corned beef, chipped beef, packaged lunch meats, salt pork, jerky, pickled herring, anchovies, regular canned tuna, sardines, salted  nuts. Dairy  Processed cheese and cheese spreads. Cheese curds. Blue cheese. Feta cheese. String cheese. Regular cottage cheese. Buttermilk. Canned milk. Fats and oils  Salted butter. Regular margarine. Ghee. Bacon fat. Seasonings and other foods  Onion salt, garlic salt, seasoned salt, table salt, and sea salt. Canned and packaged gravies. Worcestershire sauce. Tartar sauce. Barbecue sauce. Teriyaki sauce. Soy sauce, including reduced-sodium. Steak sauce. Fish sauce. Oyster sauce. Cocktail sauce. Horseradish that you find on the shelf. Regular ketchup and mustard. Meat flavorings and tenderizers. Bouillon cubes. Hot sauce and Tabasco sauce. Premade or packaged marinades. Premade or packaged taco seasonings. Relishes. Regular salad dressings. Salsa. Potato and tortilla chips. Corn chips and puffs. Salted popcorn and pretzels. Canned or dried soups. Pizza. Frozen entrees and pot pies. Summary  Eating less sodium can help lower your blood pressure, reduce swelling, and protect your heart, liver, and kidneys.  Most people on this plan should limit their sodium intake to 1,500-2,000 mg (milligrams) of sodium each day.  Canned, boxed, and frozen foods are high in sodium. Restaurant foods, fast foods, and pizza are also very high in sodium. You also get sodium by adding salt to food.  Try to cook at home, eat more fresh fruits and vegetables, and eat less fast food, canned, processed, or prepared foods. This information is not intended to replace advice given to you by your health care provider. Make sure you discuss any questions you have with your health care provider. Document Released: 02/27/2002 Document Revised: 08/31/2016 Document Reviewed: 08/31/2016 Elsevier  Education  2017 [employer]  

## 2016-11-20 NOTE — Progress Notes (Signed)
Cardiology Office Note:    Date:  11/20/2016   ID:  Neil Housekeeper Sr., DOB 01/11/1951, MRN [id number]  PCP:  Hoyt Koch, MD  Cardiologist:  Dr. Sherren Mocha   Electrophysiologist:  Dr. Allegra Lai Sleep:  Dr. Fransico Him   Referring MD: Hoyt Koch, *   Chief Complaint  Patient presents with  . Follow-up    CAD, CHF, AF    History of Present Illness:    Neil Touchet. is a 66 y.o. male with a hx of Atrial flutter status post RFCA, PAD with bilateral SFA occlusions, CAD treated medically, diastolic heart failure, sleep apnea, DM, tobacco abuse, COPD. The patient has had recurrent atrial fibrillation/flutter and underwent cardioversion in 10/17. This was unsuccessful. Rate control strategy has been pursued. Last seen by Dr. Burt Knack 11/17.  He returns for Cardiology follow up.  He is here alone. He has noted some tightness in his chest at night prior to putting on his CPAP. He denies exertional symptoms. He denies any associated symptoms. He has not taken nitroglycerin. He thinks it is related to anxiety. He has a chronic cough. He notes chronic dyspnea with exertion. He continues to smoke. Over the last year, he thinks that his breathing has gotten worse. He probably describes NYHA 2b-3 symptoms. He denies orthopnea. He denies PND. His legs remain chronically swollen. He describes an episode of syncope about 2 weeks ago. He had no warning. His friend told him that he was coughing prior to passing out. He does not remember this. He denies any injury.  Prior CV studies:   The following studies were reviewed today:  Echo 6/17 GLS -11.7%, moderate LVH, EF 55-60, normal wall motion, mild AI, MAC, mild MR, mild TR  Echo 5/17 EF 60-65, normal wall motion, aortic sclerosis without stenosis, trivial AI, aortic root 39 mm, ascending aorta 39 mm, trivial MR, LA/RA upper limits of normal, mild TR, PASP 39  Event monitor 3/17 Sinus rhythm and atrial  fibrillation No SVT or VT noted.  LHC 10/16 LAD irregularities LCx distal 80 RCA mid 100-CTO EF 55-65  Carotid US 1/14 Plaque without significant ICA stenosis bilaterally  Myoview 4/13 Abnormal stress nuclear study with a small, moderate intensity, partially reversible inferior defect consistent with inferior thinning and mild inferior ischemia.  LV Ejection Fraction: Study not gated  Past Medical History:  Diagnosis Date  . Adenomatous colon polyp   . Arthritis    left hip replacement  . Atrial flutter ([place])    onset  2011. s/p EPS/RFA 12/2011  . Cataract   . Chronic bronchitis   . Contact dermatitis and other eczema due to plants (except food)   . COPD (chronic obstructive pulmonary disease) ([place])   . GERD (gastroesophageal reflux disease)   . Hyperlipidemia   . Hypertension   . LV dysfunction    EF 45-50% 12/2011  . OSA (obstructive sleep apnea) 10/13/2016   Mild with AHI 9.7/hr with significant oxygen desaturations as low as 76% now on CPAP  . Other peripheral vascular disease(443.89)    bilateral lower extremity  . Tobacco use disorder    dependent  . Transient global amnesia   . Tuberculosis   . Type II diabetes mellitus ([place])     Past Surgical History:  Procedure Laterality Date  . A FLUTTER ABLATION N/A 12/28/2011   Procedure: ABLATION A FLUTTER;  Surgeon: Evans Lance, MD;  Location: [facility] CATH LAB;  Service: Cardiovascular;  Laterality: N/A;  .  CARDIAC CATHETERIZATION N/A 07/10/2015   Procedure: Left Heart Cath and Coronary Angiography;  Surgeon: Sherren Mocha, MD;  Location: [place] CV LAB;  Service: Cardiovascular;  Laterality: N/A;  . CARDIAC ELECTROPHYSIOLOGY MAPPING AND ABLATION  03/2010  . CARDIOVERSION N/A 07/13/2016   Procedure: CARDIOVERSION;  Surgeon: Skeet Latch, MD;  Location: [place];  Service: Cardiovascular;  Laterality: N/A;  . COLONOSCOPY    . colonoscopy with polypectomy  2006 & 2011   Dr [name]  . CYSTOSCOPY  11/2007   Dr  Jeffie Pollock  . PARTIAL HIP ARTHROPLASTY Right 01/2000   "Right; replaced ball & stem"  . PARTIAL HIP ARTHROPLASTY Left   . POLYPECTOMY    . TEE WITHOUT CARDIOVERSION  12/28/2011   Procedure: TRANSESOPHAGEAL ECHOCARDIOGRAM (TEE);  Surgeon: Larey Dresser, MD;  Location: [facility] ENDOSCOPY;  Service: Cardiovascular;  Laterality: N/A;    Current Medications: Current Meds  Medication Sig  . acetaminophen (TYLENOL) 650 MG CR tablet Take 650 mg by mouth every 8 (eight) hours as needed for pain.  [name] CARTIA XT 180 MG 24 hr capsule take 1 capsule by mouth once daily  . cilostazol (PLETAL) 100 MG tablet take 1 tablet by mouth twice a day  . clobetasol (OLUX) 0.05 % topical foam Apply topically 2 (two) times daily.   . collagenase (SANTYL) ointment Apply 1 application topically daily.  [name] etanercept (ENBREL SURECLICK) 50 MG/ML injection Inject 50 mg into the skin 2 (two) times a week. Inject on Sundays & Wednesdays  . Flaxseed, Linseed, (FLAX SEED OIL) 1000 MG CAPS Take 1 capsule by mouth 2 (two) times daily.   . Fluticasone-Salmeterol (ADVAIR) 100-50 MCG/DOSE AEPB Inhale 1 puff into the lungs 2 (two) times daily.  . furosemide (LASIX) 40 MG tablet Take 2 tablets (80 mg total) by mouth 2 (two) times daily.  [name] LEVEMIR FLEXTOUCH 100 UNIT/ML Pen INJECT 24 UNITS            SUBCUTANEOUSLY DAILY  . LEVEMIR FLEXTOUCH 100 UNIT/ML Pen INJECT 24 UNITS            SUBCUTANEOUSLY DAILY  . Liraglutide (VICTOZA) 18 MG/3ML SOPN INJECT 0.3ML (=1.8MG)      SUBCUTANEOUSLY DAILY  . metFORMIN (GLUCOPHAGE) 1000 MG tablet Take 1,000 mg by mouth 2 (two) times daily with a meal.  . metoprolol (LOPRESSOR) 100 MG tablet Take 100 mg by mouth every morning. And take 1 and 1/2 tablets by mouth every evening  . Multiple Vitamins-Minerals (CENTRUM ADULTS) TABS Take 1 tablet by mouth daily.  . mupirocin ointment (BACTROBAN) 2 % Apply 1 application topically 2 (two) times daily as needed (affected areas).   . NOVOFINE 32G X 6 MM MISC USE 2 DAILY  WITH LEVEMIR AND VICTOZA  . Omega-3 Fatty Acids (FISH OIL) 1000 MG CAPS Take 2 capsules by mouth 2 (two) times daily.   . potassium chloride SA (K-DUR,KLOR-CON) 20 MEQ tablet Take 1 tablet (20 mEq total) by mouth 2 (two) times daily.  . rivaroxaban (XARELTO) 20 MG TABS tablet Take 20 mg by mouth daily with supper.  . rosuvastatin (CRESTOR) 10 MG tablet Take 1 tablet (10 mg total) by mouth daily.  . tamsulosin (FLOMAX) 0.4 MG CAPS capsule Take 0.4 mg by mouth daily.   [name] triamcinolone cream (KENALOG) 0.1 % Apply 1 application topically 2 (two) times daily as needed (affected area).   . [DISCONTINUED] losartan (COZAAR) 100 MG tablet take 1 tablet by mouth once daily  . [DISCONTINUED] metolazone (ZAROXOLYN) 2.5 MG tablet take  one tablet by mouth 30 minutes prior to morning Furosemide dosage as needed once a week for weight gain greater than 5 pounds     Allergies:   Niacin   Social History   Social History  . Marital status: Widowed    Spouse name: N/A  . Number of children: 5  . Years of education: N/A   Occupational History  . security guard    Social History Main Topics  . Smoking status: Current Every Day Smoker    Packs/day: 1.00    Years: 44.00    Types: Cigarettes  . Smokeless tobacco: Never Used     Comment: started @ age 83, up to 2 ppd;1.25-1.5 ppd as of 11/23/13  . Alcohol use 0.0 oz/week     Comment:  11/23/13 "2-3 drinks per year"  . Drug use: No  . Sexual activity: Not Currently   Other Topics Concern  . None   Social History Narrative  . None     Family History  Problem Relation Age of Onset  . Stroke Mother 54  . Hypertension Mother   . Diabetes Mother   . Diabetes Paternal Grandmother   . Lung cancer Father     smoker  . Diabetes Brother   . Heart attack Brother 89  . Diabetes Brother   . Hepatitis C Brother   . Throat cancer Paternal Uncle     1/2 uncle  . Heart attack Brother 83  . Crohn's disease Son   . Colon cancer Neg Hx   . Esophageal cancer  Neg Hx   . Rectal cancer Neg Hx   . Stomach cancer Neg Hx      ROS:   Please see the history of present illness.    ROS All other systems reviewed and are negative.   EKGs/Labs/Other Test Reviewed:    EKG:  EKG is  ordered today.  The ekg ordered today demonstrates Atrial fibrillation, HR 86  Recent Labs: 02/25/2016: B Natriuretic Peptide 226.9 02/28/2016: Magnesium 2.4 07/06/2016: Platelets 149 07/13/2016: Hemoglobin 17.3 09/17/2016: ALT 17; BUN 14; Creatinine, Ser 1.32; Potassium 4.2; Sodium 140   Recent Lipid Panel    Component Value Date/Time   CHOL 110 05/29/2015 1116   TRIG 96.0 05/29/2015 1116   HDL 38.20 (L) 05/29/2015 1116   CHOLHDL 3 05/29/2015 1116   VLDL 19.2 05/29/2015 1116   LDLCALC 53 05/29/2015 1116     Physical Exam:    VS:  BP 100/60 (BP Location: Left Arm, Patient Position: Sitting, Cuff Size: Large)   Pulse 86   Ht _0  (1.803 m)   Wt 274 lb (124.3 kg)   BMI 38.22 kg/m     Wt Readings from Last 3 Encounters:  11/20/16 274 lb (124.3 kg)  10/27/16 273 lb (123.8 kg)  10/19/16 268 lb 3.2 oz (121.7 kg)     Physical Exam  Constitutional: He is oriented to person, place, and time. He appears well-developed and well-nourished. No distress.  HENT:  Head: Normocephalic and atraumatic.  Eyes: No scleral icterus.  Neck: Normal range of motion. JVD present.  JVP 6-7 cm  Cardiovascular: Normal rate, regular rhythm, S1 normal and S2 normal.  Exam reveals distant heart sounds.   No murmur heard. Pulmonary/Chest: Effort normal. He has decreased breath sounds. He has rhonchi in the right upper field, the right middle field, the right lower field, the left upper field, the left middle field and the left lower field. He has no rales.  Abdominal:  Soft. He exhibits distension. There is no tenderness.  Musculoskeletal: He exhibits edema.  Tight 2+ brawny bilateral LE edema  Neurological: He is alert and oriented to person, place, and time.  Skin: Skin is warm  and dry.  Psychiatric: He has a normal mood and affect.    ASSESSMENT:    1. Acute on chronic diastolic heart failure ([place])   2. Syncope, unspecified syncope type   3. Hypertensive heart disease with CHF (congestive heart failure) (H[facility])   4. Persistent atrial fibrillation ([place])   5. Coronary artery disease involving coronary bypass graft of native heart with angina pectoris ([place])   6. Chronic bronchitis, unspecified chronic bronchitis type ([place])   7. OSA (obstructive sleep apnea)   8. CKD (chronic kidney disease) stage 2, GFR 60-89 ml/min    PLAN:    In order of problems listed above:  1. Acute on Chronic diastolic HF - He is chronically volume overloaded.  He has a diet high in salt (eats at [place], [employer], [employer], [employer]; also eats Spam at home). I have asked him to cut back on his salt intake and will provide diet info. He is already on high-dose furosemide twice daily as well as metolazone once a week. I will increase his metolazone to twice a week. Obtain BMET, CBC, BNP today. If BNP is very high, consider changing Furosemide to Torsemide.  He has a hx of normal RVF on echo but with his long hx of smoking and exam c/w R sided HF, he likely has a combination of R and L sided CHF.  Repeat BMET 2 weeks.  2. Syncope - This is unexplained. His history is s/w concerning for cardiogenic syncope.  He tells me that he had a friend with him that witnessed him coughing prior to passing out. However, he does not remember this. He had no prodrome and awoke on the floor without sequelae.  He has had normal LV function the past. He is on 2 AV nodal blocking agents and is in persistent atrial fibrillation.   -  I have asked him to not drive for now.   -  I will obtain a limited echo to recheck his ejection fraction.   -  I will obtain a 30 day event monitor to rule out significant pauses or ventricular arrhythmias.  3. HTN Heart Disease with HF - Blood pressure is somewhat low. Given his  recent syncope, I will decrease his losartan to 50 mg daily.  4. Persistent AF - He remains in atrial fibrillation. Heart rate is controlled. However, with his recent syncopal episode, I will obtain an event monitor to rule out pauses. Continue Xarelto.  5. CAD - He has noted recent tightness in his chest prior to placing his CPAP on. This sounds more consistent with anxiety than angina. He is currently controlled on beta blocker and calcium Blocker. Cardiac catheterization in the past demonstrated chronically occluded RCA and distal LCx disease.  If his EF is down on echo or his symptoms progress, we may need to consider cardiac cath.  He is a diabetic with known CAD who continues to smoke.  He is at very high risk for progressive disease.  He is not on ASA as he is on Pletal and Xarelto.  Continue Crestor.  Given his high risk, we should consider increasing his statin to at least mod intensity (ie Crestor 20).  This can be discussed at FU.   6. COPD - His lung exam is abnormal.  I have advised him to stop smoking. Follow-up with pulmonology as planned.  7. OSA - Continue CPAP. Continue follow-up with Dr. Radford Pax.  8. CKD - Monitor renal function and potassium closely with adjustments in diuretics.  Total time spent with patient today 45 minutes. This includes reviewing records, evaluating the patient and coordinating care. Face-to-face time >50%.   Medication Adjustments/Labs and Tests Ordered: Current medicines are reviewed at length with the patient today.  Concerns regarding medicines are outlined above.  Medication changes, Labs and Tests ordered today are outlined in the Patient Instructions noted below. Patient Instructions  Medication Instructions:  1. DECREASE LOSARTAN TO 50 MG DAILY 2. INCREASE METOLAZONE 2.5 MG TO TWO DAYS A WEEK  Labwork: 1. TODAY BMET, CBC , BNP 2. IN 2 WEEK REPEAT  BMET NEEDED  Testing/Procedures: 1. Your physician has requested that you have an LIMITED  echocardiogram. Echocardiography is a painless test that uses sound waves to create images of your heart. It provides your doctor with information about the size and shape of your heart and how well your heart's chambers and valves are working. This procedure takes approximately one hour. There are no restrictions for this procedure.  2. Your physician has recommended that you wear an event monitor. Event monitors are medical devices that record the heart's electrical activity. Doctors most often [name] these monitors to diagnose arrhythmias. Arrhythmias are problems with the speed or rhythm of the heartbeat. The monitor is a small, portable device. You can wear one while you do your normal daily activities. This is usually used to diagnose what is causing palpitations/syncope (passing out).  Follow-Up: Neil Carroll,M PAC IN 6 WEEKS SAME DAY DR. Burt Knack IS IN THE OFFICE PLEASE  Any Other Special Instructions Will Be Listed Below (If Applicable). 1. STOP SMOKING  2. NO DRIVING UNTIL FURTHER ASSESS BY CARDIOLOGY 3. FOLLOW A LOW SALT DIET  If you need a refill on your cardiac medications before your next appointment, please call your pharmacy.   Return in about 6 weeks (around 01/01/2017) for Follow up after testing, w/ Richardson Dopp, PA-C or Dr. Burt Knack .   Signed, Richardson Dopp, PA-C  11/20/2016 11:19 AM    [facility] [address], [place], [place]  [address] Phone: [phone]; Fax: [phone]

## 2016-11-23 ENCOUNTER — Other Ambulatory Visit: Payer: Self-pay | Admitting: *Deleted

## 2016-11-23 DIAGNOSIS — I110 Hypertensive heart disease with heart failure: Secondary | ICD-10-CM

## 2016-11-23 DIAGNOSIS — R7989 Other specified abnormal findings of blood chemistry: Secondary | ICD-10-CM

## 2016-11-24 ENCOUNTER — Encounter: Payer: Self-pay | Admitting: Physician Assistant

## 2016-11-24 ENCOUNTER — Ambulatory Visit (HOSPITAL_COMMUNITY): Payer: Medicare Other | Attending: Cardiology

## 2016-11-24 ENCOUNTER — Other Ambulatory Visit: Payer: Self-pay

## 2016-11-24 ENCOUNTER — Telehealth: Payer: Self-pay | Admitting: *Deleted

## 2016-11-24 DIAGNOSIS — I2510 Atherosclerotic heart disease of native coronary artery without angina pectoris: Secondary | ICD-10-CM | POA: Insufficient documentation

## 2016-11-24 DIAGNOSIS — I351 Nonrheumatic aortic (valve) insufficiency: Secondary | ICD-10-CM

## 2016-11-24 DIAGNOSIS — J449 Chronic obstructive pulmonary disease, unspecified: Secondary | ICD-10-CM | POA: Insufficient documentation

## 2016-11-24 DIAGNOSIS — I082 Rheumatic disorders of both aortic and tricuspid valves: Secondary | ICD-10-CM | POA: Insufficient documentation

## 2016-11-24 DIAGNOSIS — I481 Persistent atrial fibrillation: Secondary | ICD-10-CM

## 2016-11-24 DIAGNOSIS — I4819 Other persistent atrial fibrillation: Secondary | ICD-10-CM

## 2016-11-24 DIAGNOSIS — G4733 Obstructive sleep apnea (adult) (pediatric): Secondary | ICD-10-CM | POA: Diagnosis not present

## 2016-11-24 DIAGNOSIS — N189 Chronic kidney disease, unspecified: Secondary | ICD-10-CM | POA: Diagnosis not present

## 2016-11-24 NOTE — Telephone Encounter (Signed)
Lmtcb to go over Limited Echo results and findings as well as recommendation for a repeat Limited Echo in 6 months.

## 2016-11-25 NOTE — Telephone Encounter (Signed)
Ptcb and has been notified of Limited Echo results by phone with verbal understanding to findings. Pt is agreeable to repeat Limited Echo in 6 months. I will have [facility] call the pt with an appt for Limited Echo. I will also fax a copy to PCP as well.

## 2016-11-26 ENCOUNTER — Ambulatory Visit (INDEPENDENT_AMBULATORY_CARE_PROVIDER_SITE_OTHER): Payer: Medicare Other

## 2016-11-26 DIAGNOSIS — I4819 Other persistent atrial fibrillation: Secondary | ICD-10-CM

## 2016-11-26 DIAGNOSIS — I481 Persistent atrial fibrillation: Secondary | ICD-10-CM

## 2016-11-26 DIAGNOSIS — R55 Syncope and collapse: Secondary | ICD-10-CM

## 2016-12-01 DIAGNOSIS — I5033 Acute on chronic diastolic (congestive) heart failure: Secondary | ICD-10-CM | POA: Diagnosis not present

## 2016-12-01 DIAGNOSIS — J441 Chronic obstructive pulmonary disease with (acute) exacerbation: Secondary | ICD-10-CM | POA: Diagnosis not present

## 2016-12-01 DIAGNOSIS — G4733 Obstructive sleep apnea (adult) (pediatric): Secondary | ICD-10-CM | POA: Diagnosis not present

## 2016-12-02 ENCOUNTER — Encounter: Payer: Self-pay | Admitting: Cardiology

## 2016-12-02 DIAGNOSIS — J441 Chronic obstructive pulmonary disease with (acute) exacerbation: Secondary | ICD-10-CM | POA: Diagnosis not present

## 2016-12-02 DIAGNOSIS — I5033 Acute on chronic diastolic (congestive) heart failure: Secondary | ICD-10-CM | POA: Diagnosis not present

## 2016-12-02 DIAGNOSIS — G4733 Obstructive sleep apnea (adult) (pediatric): Secondary | ICD-10-CM | POA: Diagnosis not present

## 2016-12-03 ENCOUNTER — Other Ambulatory Visit: Payer: Medicare Other

## 2016-12-04 ENCOUNTER — Other Ambulatory Visit: Payer: Medicare Other | Admitting: *Deleted

## 2016-12-04 DIAGNOSIS — N182 Chronic kidney disease, stage 2 (mild): Secondary | ICD-10-CM

## 2016-12-04 DIAGNOSIS — R7989 Other specified abnormal findings of blood chemistry: Secondary | ICD-10-CM

## 2016-12-04 DIAGNOSIS — I110 Hypertensive heart disease with heart failure: Secondary | ICD-10-CM | POA: Diagnosis not present

## 2016-12-04 DIAGNOSIS — I5033 Acute on chronic diastolic (congestive) heart failure: Secondary | ICD-10-CM | POA: Diagnosis not present

## 2016-12-04 NOTE — Progress Notes (Signed)
p 

## 2016-12-04 NOTE — Addendum Note (Signed)
Addended by: Eulis Foster on: 12/04/2016 10:43 AM   Modules accepted: Orders

## 2016-12-07 ENCOUNTER — Ambulatory Visit (INDEPENDENT_AMBULATORY_CARE_PROVIDER_SITE_OTHER): Payer: Medicare Other | Admitting: Endocrinology

## 2016-12-07 ENCOUNTER — Telehealth: Payer: Self-pay | Admitting: *Deleted

## 2016-12-07 ENCOUNTER — Encounter: Payer: Self-pay | Admitting: Endocrinology

## 2016-12-07 VITALS — BP 94/58 | HR 93 | Ht 71.0 in | Wt 275.0 lb

## 2016-12-07 DIAGNOSIS — I110 Hypertensive heart disease with heart failure: Secondary | ICD-10-CM

## 2016-12-07 DIAGNOSIS — Z794 Long term (current) use of insulin: Secondary | ICD-10-CM

## 2016-12-07 DIAGNOSIS — E1165 Type 2 diabetes mellitus with hyperglycemia: Secondary | ICD-10-CM | POA: Diagnosis not present

## 2016-12-07 DIAGNOSIS — I25709 Atherosclerosis of coronary artery bypass graft(s), unspecified, with unspecified angina pectoris: Secondary | ICD-10-CM

## 2016-12-07 LAB — BASIC METABOLIC PANEL
BUN/Creatinine Ratio: 11 (ref 10–24)
BUN: 15 mg/dL (ref 8–27)
CHLORIDE: 98 mmol/L (ref 96–106)
CO2: 26 mmol/L (ref 18–29)
Calcium: 9.5 mg/dL (ref 8.6–10.2)
Creatinine, Ser: 1.38 mg/dL — ABNORMAL HIGH (ref 0.76–1.27)
GFR calc Af Amer: 61 mL/min/{1.73_m2} (ref >59)
GFR calc non Af Amer: 53 mL/min/{1.73_m2} — ABNORMAL LOW (ref >59)
GLUCOSE: 115 mg/dL — AB (ref 65–99)
POTASSIUM: 4 mmol/L (ref 3.5–5.2)
Sodium: 142 mmol/L (ref 134–144)

## 2016-12-07 LAB — PRO B NATRIURETIC PEPTIDE: NT-PRO BNP: 628 pg/mL — AB (ref 0–376)

## 2016-12-07 LAB — POCT GLYCOSYLATED HEMOGLOBIN (HGB A1C): Hemoglobin A1C: 7.1

## 2016-12-07 NOTE — Patient Instructions (Addendum)
Levemir 26  At nite same time daily  Check blood sugars on waking up  3x weekly  Also check blood sugars about 2 hours after a meal and do this after different meals by rotation  Recommended blood sugar levels on waking up is 90-130 and about 2 hours after meal is 130-160  Please bring your blood sugar monitor to each visit, thank you  Use no sugar oatmeal with a protein

## 2016-12-07 NOTE — Progress Notes (Signed)
Patient ID: Neil Honea., male   DOB: 1950-10-25, 66 y.o.   MRN: [id number]   Reason for Appointment : Followup for Type 2 Diabetes  History of Present Illness          Diagnosis: Type 2 diabetes mellitus, date of diagnosis:  2008     Past history: He has been treated with metformin only for several years and had relatively good control until about a year ago. However had been taking only 1000 mg of metformin. Because of his progressively higher blood sugars he had been started on Levemir insulin since 6/14.  However with this his A1c was still 10.2 in 9/14 and he was referred here With increasing Victoza to 1.8 mg in 3/15 his weight had come down and his blood sugars had improved   Recent history:   INSULIN regimen is described as:  Levemir 24 units hs Non-insulin hypoglycemic drugs the patient is taking are: Metformin 2g, Victoza 1.8 mg daily  His A1c is relatively better at 7.1, previously ranging from 6.7-7.4  Current management, blood sugar patterns and problems identified:  He has checked blood sugars sporadically and has only 10 readings for the last month  However he has only 3 readings in the morning and these are all high  He now says that he will take his Levemir injection randomly at any time he remembers to do it and can be in the afternoon or at night although more often  He has not been able to do much physical activity lately because of cardiac issues and still getting some evaluation  His highest blood sugars are when he goes off his diet and he had a fasting reading of 198 with eating at [facility] the night before  Has a few readings later in the day which are relatively better  He is still eating instant oatmeal in the morning without any protein He takes Victoza at hs with his LEVEMIR.    Side effects from medications have been: None Compliance with the medical regimen: Fair   Glucose monitoring: Irregular        Glucometer: One Touch.       Blood Glucose readings from download:  Mean values apply above for all meters except median for One Touch  PRE-MEAL Fasting Lunch Dinner Bedtime Overall  Glucose range: 156-198   118-159  115-138    Mean/median: 172     1 42      Hypoglycemia frequency: Never.           Self-care:   Meals: 2/day. 11 am 6 pm.  He is eating out at times, more salads Oatmeal in am Physical activity: exercise: none, was doing a little on treadmill         Dietician visit: Most recent: 09/2013                  Wt Readings from Last 3 Encounters:  12/07/16 275 lb (124.7 kg)  11/20/16 274 lb (124.3 kg)  10/27/16 273 lb (123.8 kg)       Lab Results  Component Value Date   HGBA1C 7.1 12/07/2016   HGBA1C 7.4 (H) 09/03/2016   HGBA1C 7.0 (H) 04/27/2016   Lab Results  Component Value Date   MICROALBUR 3.5 (H) 04/27/2016   LDLCALC 53 05/29/2015   CREATININE 1.38 (H) 12/04/2016   Office Visit on 12/07/2016  Component Date Value Ref Range Status  . Hemoglobin A1C 12/07/2016  7.1   Final  Lab on 12/04/2016  Component Date Value Ref Range Status  . Glucose 12/04/2016 115* 65 - 99 mg/dL Final  . BUN 12/04/2016 15  8 - 27 mg/dL Final  . Creatinine, Ser 12/04/2016 1.38* 0.76 - 1.27 mg/dL Final  . GFR calc non Af Amer 12/04/2016 53* >59 mL/min/1.73 Final  . GFR calc Af Amer 12/04/2016 61  >59 mL/min/1.73 Final  . BUN/Creatinine Ratio 12/04/2016 11  10 - 24 Final  . Sodium 12/04/2016 142  134 - 144 mmol/L Final  . Potassium 12/04/2016 4.0  3.5 - 5.2 mmol/L Final  . Chloride 12/04/2016 98  96 - 106 mmol/L Final  . CO2 12/04/2016 26  18 - 29 mmol/L Final  . Calcium 12/04/2016 9.5  8.6 - 10.2 mg/dL Final  . NT-Pro BNP 12/04/2016 628* 0 - 376 pg/mL Final   Comment: The following cut-points have been suggested for the use of proBNP for the diagnostic evaluation of heart failure (HF) in patients with acute dyspnea: Modality                     Age           Optimal Cut                             (years)            Point ------------------------------------------------------ Diagnosis (rule in HF)        <50            450 pg/mL                           50 - 75            900 pg/mL                               >75           1800 pg/mL Exclusion (rule out HF)  Age independent     300 pg/mL      Allergies as of 12/07/2016      Reactions   Niacin Nausea Only   REACTION: upset stomach      Medication List       Accurate as of 12/07/16 12:23 PM. Always use your most recent med list.          acetaminophen 650 MG CR tablet Commonly known as:  TYLENOL Take 650 mg by mouth every 8 (eight) hours as needed for pain.   CARTIA XT 180 MG 24 hr capsule Generic drug:  diltiazem take 1 capsule by mouth once daily   CENTRUM ADULTS Tabs Take 1 tablet by mouth daily.   cilostazol 100 MG tablet Commonly known as:  PLETAL take 1 tablet by mouth twice a day   clobetasol 0.05 % topical foam Commonly known as:  OLUX Apply topically 2 (two) times daily.   collagenase ointment Commonly known as:  SANTYL Apply 1 application topically daily.   ENBREL SURECLICK 50 MG/ML injection Generic drug:  etanercept Inject 50 mg into the skin 2 (two) times a week. Inject on Sundays & Wednesdays   Fish Oil 1000 MG Caps Take 2 capsules by mouth 2 (two) times daily.   Flax Seed Oil 1000 MG Caps Take 1 capsule by mouth 2 (two) times daily.  Fluticasone-Salmeterol 100-50 MCG/DOSE Aepb Commonly known as:  ADVAIR Inhale 1 puff into the lungs 2 (two) times daily.   furosemide 40 MG tablet Commonly known as:  LASIX Take 2 tablets (80 mg total) by mouth 2 (two) times daily.   LEVEMIR FLEXTOUCH 100 UNIT/ML Pen Generic drug:  Insulin Detemir INJECT 24 UNITS            SUBCUTANEOUSLY DAILY   LEVEMIR FLEXTOUCH 100 UNIT/ML Pen Generic drug:  Insulin Detemir INJECT 24 UNITS            SUBCUTANEOUSLY DAILY   liraglutide 18 MG/3ML Sopn Commonly known as:  VICTOZA INJECT 0.3ML (=1.'8MG'$ )       SUBCUTANEOUSLY DAILY   losartan 50 MG tablet Commonly known as:  COZAAR Take 1 tablet (50 mg total) by mouth daily.   metFORMIN 1000 MG tablet Commonly known as:  GLUCOPHAGE Take 1,000 mg by mouth 2 (two) times daily with a meal.   metolazone 2.5 MG tablet Commonly known as:  ZAROXOLYN Take 1 tablet (2.5 mg total) by mouth as directed. TAKE 1 TABLET TWICE A WEEK   metoprolol 100 MG tablet Commonly known as:  LOPRESSOR Take 100 mg by mouth every morning. And take 1 and 1/2 tablets by mouth every evening   mupirocin ointment 2 % Commonly known as:  BACTROBAN Apply 1 application topically 2 (two) times daily as needed (affected areas).   NOVOFINE 32G X 6 MM Misc Generic drug:  Insulin Pen Needle USE 2 DAILY WITH LEVEMIR AND VICTOZA   potassium chloride SA 20 MEQ tablet Commonly known as:  K-DUR,KLOR-CON Take 1 tablet (20 mEq total) by mouth 2 (two) times daily.   rivaroxaban 20 MG Tabs tablet Commonly known as:  XARELTO Take 20 mg by mouth daily with supper.   rosuvastatin 10 MG tablet Commonly known as:  CRESTOR Take 1 tablet (10 mg total) by mouth daily.   tamsulosin 0.4 MG Caps capsule Commonly known as:  FLOMAX Take 0.4 mg by mouth daily.   triamcinolone cream 0.1 % Commonly known as:  KENALOG Apply 1 application topically 2 (two) times daily as needed (affected area).       Allergies:  Allergies  Allergen Reactions  . Niacin Nausea Only    REACTION: upset stomach    Past Medical History:  Diagnosis Date  . Adenomatous colon polyp   . Aortic insufficiency    Echo 3/18: Severe concentric LVH, EF 60-65, normal wall motion, moderate AI, mild LAE, mild TR  . Arthritis    left hip replacement  . Atrial flutter ([place])    onset  2011. s/p EPS/RFA 12/2011  . Cataract   . Chronic bronchitis   . Contact dermatitis and other eczema due to plants (except food)   . COPD (chronic obstructive pulmonary disease) ([place])   . GERD (gastroesophageal reflux disease)   .  Hyperlipidemia   . Hypertension   . LV dysfunction    EF 45-50% 12/2011  . OSA (obstructive sleep apnea) 10/13/2016   Mild with AHI 9.7/hr with significant oxygen desaturations as low as 76% now on CPAP  . Other peripheral vascular disease(443.89)    bilateral lower extremity  . Tobacco use disorder    dependent  . Transient global amnesia   . Tuberculosis   . Type II diabetes mellitus ([place])     Past Surgical History:  Procedure Laterality Date  . A FLUTTER ABLATION N/A 12/28/2011   Procedure: ABLATION A FLUTTER;  Surgeon: Evans Lance,  MD;  Location: [facility] CATH LAB;  Service: Cardiovascular;  Laterality: N/A;  . CARDIAC CATHETERIZATION N/A 07/10/2015   Procedure: Left Heart Cath and Coronary Angiography;  Surgeon: Sherren Mocha, MD;  Location: [facility] CV LAB;  Service: Cardiovascular;  Laterality: N/A;  . CARDIAC ELECTROPHYSIOLOGY MAPPING AND ABLATION  03/2010  . CARDIOVERSION N/A 07/13/2016   Procedure: CARDIOVERSION;  Surgeon: Skeet Latch, MD;  Location: [facility];  Service: Cardiovascular;  Laterality: N/A;  . COLONOSCOPY    . colonoscopy with polypectomy  2006 & 2011   Dr [name]  . CYSTOSCOPY  11/2007   Dr Jeffie Pollock  . PARTIAL HIP ARTHROPLASTY Right 01/2000   "Right; replaced ball & stem"  . PARTIAL HIP ARTHROPLASTY Left   . POLYPECTOMY    . TEE WITHOUT CARDIOVERSION  12/28/2011   Procedure: TRANSESOPHAGEAL ECHOCARDIOGRAM (TEE);  Surgeon: Larey Dresser, MD;  Location: [facility] ENDOSCOPY;  Service: Cardiovascular;  Laterality: N/A;    Family History  Problem Relation Age of Onset  . Stroke Mother 18  . Hypertension Mother   . Diabetes Mother   . Diabetes Paternal Grandmother   . Lung cancer Father     smoker  . Diabetes Brother   . Heart attack Brother 21  . Diabetes Brother   . Hepatitis C Brother   . Throat cancer Paternal Uncle     1/2 uncle  . Heart attack Brother 75  . Crohn's disease Son   . Colon cancer Neg Hx   . Esophageal cancer Neg Hx   . Rectal  cancer Neg Hx   . Stomach cancer Neg Hx     Social History:  reports that he has been smoking Cigarettes.  He has a 44.00 pack-year smoking history. He has never used smokeless tobacco. He reports that he drinks alcohol. He reports that he does not use drugs.    Review of Systems       Lipids: He has  low HDL, LDL Has not been checked, this has been managed previously by PCP and cardiologist Still taking Crestor 10 mg   Lab Results  Component Value Date   CHOL 110 05/29/2015   HDL 38.20 (L) 05/29/2015   LDLCALC 53 05/29/2015   TRIG 96.0 05/29/2015   CHOLHDL 3 05/29/2015              Has occasional   numbness, tingling in the right first or second toes, no burning in feet, on B6 vitamins OTC Diabetic foot exam in 12/17 showed normal monofilament sensation in the toes and plantar surfaces, no skin lesions or ulcers on the feet and absent pedal pulses  He says his losartan was reduced recently Blood pressure is still relatively low, not feeling lightheaded recently   Physical Examination:  BP (!) 94/58   Pulse 93   Ht '5\' 11"'$  (1.803 m)   Wt 275 lb (124.7 kg)   SpO2 90%   BMI 38.35 kg/m       ASSESSMENT:  Diabetes type 2, uncontrolled    See history of present illness for detailed discussion of current diabetes management, blood sugar patterns and problems identified  Currently on maximum dose of Victoza and metformin along with basal insulin  His control is fair with A1c 7.1 although has been better He appears to have more consistently high fasting readings which is partly related to inconsistent diet at night, not taking the Levemir at the same time in the evening and no exercise He is eating a relatively high carbohydrate breakfast  without protein Occasionally going out to fast food although eating more salads now  PLAN:  Start taking the Levemir consistently at the same time late evening Also needs to be checking blood sugars at various times including after  meals more consistently Start walking on treadmill or outside when able to He will increase the Levemir by 2 units at least to get morning readings down Consistent diet Advised him to see the dietitian but he does not think this will be helpful Discussed adding protein to each meal especially breakfast and eliminating high carbohydrate foods in the morning Discussed examples of protein to be used in the morning  Continue Victoza 1.8 mg   Follow-up in 3 months   Counseling time on subjects discussed above is over 50% of today's 25 minute visit   Patient Instructions  Levemir 26  At nite same time daily  Check blood sugars on waking up  3x weekly  Also check blood sugars about 2 hours after a meal and do this after different meals by rotation  Recommended blood sugar levels on waking up is 90-130 and about 2 hours after meal is 130-160  Please bring your blood sugar monitor to each visit, thank you  Use no sugar oatmeal with a protein     Neil Carroll 12/07/2016, 12:23 PM   Note: This office note was prepared with Dragon voice recognition system technology. Any transcriptional errors that result from this process are unintentional.

## 2016-12-07 NOTE — Telephone Encounter (Signed)
Pt notified of lab results by phone with verbal understanding. Pt agreeable to repeat bmet to be done 12/21/16 at the [facility].

## 2016-12-08 ENCOUNTER — Telehealth: Payer: Self-pay | Admitting: *Deleted

## 2016-12-08 NOTE — Telephone Encounter (Signed)
Call from [facility] at [place].  They have a recording of AFIB with RVR rates 100-120 from March 15 at 11:53 CT.  It just came through to them today.  She stated that the delay can happen sometimes because the patient may be out of a reception area.    The recording that they checked today shows that he is still in afib, rvr with same rates of 100-120 bpm at 11:35 am CT.  They called the patient and were told he was asymptomatic.   Monitor was ordered for a syncopal episode to r/o significant pauses or ventricular arrhythmias.  He is on Xarelto.   I called the patient.  He feels the same as his usual.  Yesterday his energy level was down some but otherwise no differences.   He stated the monitor company called him around 3 am one night, he said it could have been overnight 15th-16th.  "they said something about passing out"  Pt does not recall this.  He was asleep, woke up and then went back to sleep.  Pt is aware that once we obtain monitor strips we will contact him with any further recommendations.

## 2016-12-08 NOTE — Telephone Encounter (Signed)
Reviewed critical recording from 3/15 with Dr. Johnsie Cancel (DOD).  Informed that today he is afib, HR 110.  Per Dr. Johnsie Cancel continue to monitor for now, no changes in medications.   Recording signed by DOD and placed in Dr. Antionette Char mailbox for review.

## 2016-12-09 ENCOUNTER — Telehealth: Payer: Self-pay

## 2016-12-09 NOTE — Telephone Encounter (Signed)
Received ONO report results from Dr. Radford Pax. Per Dr. Radford Pax, patient is to be referred to Pulmonary ASAP.  Left message to call back.

## 2016-12-10 NOTE — Telephone Encounter (Signed)
Confirmed with patient he sees [name]. He had an OV in January and has a 3 mo follow-up in April.  Once ONO is scanned in, will route to [name].

## 2016-12-10 NOTE — Telephone Encounter (Signed)
Follow Up: ° ° ° °Returning your call from yesterday. °

## 2016-12-14 NOTE — Telephone Encounter (Signed)
ONO results forwarded to [name].

## 2016-12-21 ENCOUNTER — Other Ambulatory Visit: Payer: Medicare Other | Admitting: *Deleted

## 2016-12-21 DIAGNOSIS — I110 Hypertensive heart disease with heart failure: Secondary | ICD-10-CM

## 2016-12-22 ENCOUNTER — Telehealth: Payer: Self-pay | Admitting: *Deleted

## 2016-12-22 DIAGNOSIS — I5032 Chronic diastolic (congestive) heart failure: Secondary | ICD-10-CM

## 2016-12-22 LAB — BASIC METABOLIC PANEL
BUN/Creatinine Ratio: 10 (ref 10–24)
BUN: 15 mg/dL (ref 8–27)
CHLORIDE: 95 mmol/L — AB (ref 96–106)
CO2: 21 mmol/L (ref 18–29)
Calcium: 9.2 mg/dL (ref 8.6–10.2)
Creatinine, Ser: 1.46 mg/dL — ABNORMAL HIGH (ref 0.76–1.27)
GFR calc non Af Amer: 49 mL/min/{1.73_m2} — ABNORMAL LOW (ref >59)
GFR, EST AFRICAN AMERICAN: 57 mL/min/{1.73_m2} — AB (ref >59)
Glucose: 181 mg/dL — ABNORMAL HIGH (ref 65–99)
Potassium: 3.7 mmol/L (ref 3.5–5.2)
Sodium: 139 mmol/L (ref 134–144)

## 2016-12-22 MED ORDER — LOSARTAN POTASSIUM 25 MG PO TABS: 25.0000 mg | ORAL_TABLET | Freq: Every day | ORAL | Status: DC

## 2016-12-22 NOTE — Telephone Encounter (Signed)
-----   Message from Liliane Shi, [place] sent at 12/22/2016  4:38 PM EDT ----- Please call the patient Kidney function is stable. BP was low at last couple of visits. Decrease Losartan to 25 mg QD. Repeat BMET at follow up with Dr. Sherren Mocha later this month.  Richardson Dopp, PA-C   12/22/2016 4:37 PM

## 2016-12-22 NOTE — Telephone Encounter (Signed)
Pt notified of lab results and recommendation to decrease Losartan to 25 mg daily. Pt agreeable to plan of care to decrease Losartan to 25 mg daily. Pt said he will cut the 50 mg tablet in 1/2 and discuss further with Dr. Burt Knack. Bmet to be done at Dr. Burt Knack appt. 01/08/17.

## 2016-12-24 ENCOUNTER — Telehealth: Payer: Self-pay | Admitting: Pulmonary Disease

## 2016-12-24 ENCOUNTER — Encounter: Payer: Self-pay | Admitting: Cardiovascular Disease

## 2016-12-24 NOTE — Telephone Encounter (Signed)
-----   Message from Theodoro Parma, RN sent at 12/14/2016 12:58 PM EDT ----- Please review ONO results.  Thank you! Santiago Bur, RN

## 2016-12-24 NOTE — Telephone Encounter (Signed)
Patient states [facility] needs an order to raise oxygen settings from 2 to 4... Contact # [phone].Mearl Latin

## 2016-12-24 NOTE — Telephone Encounter (Signed)
lmtcb

## 2016-12-24 NOTE — Telephone Encounter (Signed)
ONO on CPAP and 4 L oxygen showed significant desaturation He did not have significant airway obstruction on PFTs Has office visit scheduled for 4/27 Needs to be seen sooner if possible with TP/me to evaluate if decompensated CHF causing worsening hypoxia, otherwise may need another titration study

## 2016-12-24 NOTE — Telephone Encounter (Signed)
Spoke with patient about ONO results. He stated that he has not heard back from [facility] about increasing his CPAP level from 2 to 4. I believe the patient is very confused because it appears Dr. Radford Pax is trying to treat his OSA as well. According to your last OV, his CPAP is set at 13cm and his oxygen is set at 2.  Offered patient a sooner appt with TP, but he stated he will call the DME company to come and check his machine because he tried to adjust the settings himself.

## 2017-01-04 ENCOUNTER — Ambulatory Visit: Payer: Medicare Other | Admitting: Physician Assistant

## 2017-01-04 NOTE — Progress Notes (Signed)
Subjective:   Neil Carroll. is a 66 y.o. male who presents for an Initial Medicare Annual Wellness Visit.  Review of Systems  No ROS.  Medicare Wellness Visit.  Cardiac Risk Factors include: advanced age (>51mn, >>20women);diabetes mellitus;dyslipidemia;hypertension;male gender;sedentary lifestyle Sleep patterns: falls asleep easily, gets up 1-2 times nightly to void and sleeps 4-6 hours nightly. Wears C-pap  Reviewed recommended sleep tips, insomnia education was attached to patient's AVS   Home Safety/Smoke Alarms:  Feels safe in home. Smoke alarms in place.   Living environment; residence and Firearm Safety: 1-story house/ trailer, no firearms.Lives alone, no needs for DME, has home oxygen patient wears at night. Seat Belt Safety/Bike Helmet: Wears seat belt.   Counseling:   Eye Exam- appointment yearly, Dr. sOrlie DakinDental- appointment 3 years ago, patient has dental insurance, states he will make an appointment  Male:   [facility]- Last 03/04/15, normal, recall 7 years    PSA-  Lab Results  Component Value Date   PSA DONE 11/06/2005     Objective:    Today's Vitals   01/05/17 1021  BP: 116/64  Pulse: 96  Resp: (!) 22  SpO2: 96%  Weight: 272 lb (123.4 kg)  Height: '5\' 11"'$  (1.803 m)   Body mass index is 37.94 kg/m.  Current Medications (verified) Outpatient Encounter Prescriptions as of 01/05/2017  Medication Sig  . acetaminophen (TYLENOL) 650 MG CR tablet Take 650 mg by mouth every 8 (eight) hours as needed for pain.  .Marland KitchenCARTIA XT 180 MG 24 hr capsule take 1 capsule by mouth once daily  . cilostazol (PLETAL) 100 MG tablet take 1 tablet by mouth twice a day  . clobetasol (OLUX) 0.05 % topical foam Apply topically 2 (two) times daily.   . collagenase (SANTYL) ointment Apply 1 application topically daily.  .Marland Kitchenetanercept (ENBREL SURECLICK) 50 MG/ML injection Inject 50 mg into the skin 2 (two) times a week. Inject on Sundays & Wednesdays  . Flaxseed, Linseed, (FLAX  SEED OIL) 1000 MG CAPS Take 1 capsule by mouth 2 (two) times daily.   . Fluticasone-Salmeterol (ADVAIR) 100-50 MCG/DOSE AEPB Inhale 1 puff into the lungs 2 (two) times daily.  . furosemide (LASIX) 40 MG tablet Take 2 tablets (80 mg total) by mouth 2 (two) times daily.  .Marland KitchenLEVEMIR FLEXTOUCH 100 UNIT/ML Pen INJECT 24 UNITS            SUBCUTANEOUSLY DAILY  . Liraglutide (VICTOZA) 18 MG/3ML SOPN INJECT 0.3ML (=1.'8MG'$ )      SUBCUTANEOUSLY DAILY  . losartan (COZAAR) 25 MG tablet Take 1 tablet (25 mg total) by mouth daily.  . metFORMIN (GLUCOPHAGE) 1000 MG tablet Take 1,000 mg by mouth 2 (two) times daily with a meal.  . metolazone (ZAROXOLYN) 2.5 MG tablet Take 1 tablet (2.5 mg total) by mouth as directed. TAKE 1 TABLET TWICE A WEEK  . metoprolol (LOPRESSOR) 100 MG tablet Take 100 mg by mouth every morning. And take 1 and 1/2 tablets by mouth every evening  . Multiple Vitamins-Minerals (CENTRUM ADULTS) TABS Take 1 tablet by mouth daily.  . mupirocin ointment (BACTROBAN) 2 % Apply 1 application topically 2 (two) times daily as needed (affected areas).   . NOVOFINE 32G X 6 MM MISC USE 2 DAILY WITH LEVEMIR AND VICTOZA  . Omega-3 Fatty Acids (FISH OIL) 1000 MG CAPS Take 2 capsules by mouth 2 (two) times daily.   . potassium chloride SA (K-DUR,KLOR-CON) 20 MEQ tablet Take 1 tablet (20 mEq total) by  mouth 2 (two) times daily.  . rivaroxaban (XARELTO) 20 MG TABS tablet Take 20 mg by mouth daily with supper.  . rosuvastatin (CRESTOR) 10 MG tablet Take 1 tablet (10 mg total) by mouth daily.  . tamsulosin (FLOMAX) 0.4 MG CAPS capsule Take 0.4 mg by mouth daily.   [name] triamcinolone cream (KENALOG) 0.1 % Apply 1 application topically 2 (two) times daily as needed (affected area).   . [DISCONTINUED] LEVEMIR FLEXTOUCH 100 UNIT/ML Pen INJECT 24 UNITS            SUBCUTANEOUSLY DAILY   No facility-administered encounter medications on file as of 01/05/2017.     Allergies (verified) Niacin   History: Past Medical  History:  Diagnosis Date  . Adenomatous colon polyp   . Aortic insufficiency    Echo 3/18: Severe concentric LVH, EF 60-65, normal wall motion, moderate AI, mild LAE, mild TR  . Arthritis    left hip replacement  . Atrial flutter ([place])    onset  2011. s/p EPS/RFA 12/2011  . Cataract   . Chronic bronchitis   . Contact dermatitis and other eczema due to plants (except food)   . COPD (chronic obstructive pulmonary disease) ([place])   . GERD (gastroesophageal reflux disease)   . Hyperlipidemia   . Hypertension   . LV dysfunction    EF 45-50% 12/2011  . OSA (obstructive sleep apnea) 10/13/2016   Mild with AHI 9.7/hr with significant oxygen desaturations as low as 76% now on CPAP  . Other peripheral vascular disease(443.89)    bilateral lower extremity  . Tobacco use disorder    dependent  . Transient global amnesia   . Tuberculosis   . Type II diabetes mellitus ([place])    Past Surgical History:  Procedure Laterality Date  . A FLUTTER ABLATION N/A 12/28/2011   Procedure: ABLATION A FLUTTER;  Surgeon: Evans Lance, MD;  Location: [facility] CATH LAB;  Service: Cardiovascular;  Laterality: N/A;  . CARDIAC CATHETERIZATION N/A 07/10/2015   Procedure: Left Heart Cath and Coronary Angiography;  Surgeon: Sherren Mocha, MD;  Location: [facility] CV LAB;  Service: Cardiovascular;  Laterality: N/A;  . CARDIAC ELECTROPHYSIOLOGY MAPPING AND ABLATION  03/2010  . CARDIOVERSION N/A 07/13/2016   Procedure: CARDIOVERSION;  Surgeon: Skeet Latch, MD;  Location: [place];  Service: Cardiovascular;  Laterality: N/A;  . COLONOSCOPY    . colonoscopy with polypectomy  2006 & 2011   Dr [name]  . CYSTOSCOPY  11/2007   Dr Jeffie Pollock  . PARTIAL HIP ARTHROPLASTY Right 01/2000   "Right; replaced ball & stem"  . PARTIAL HIP ARTHROPLASTY Left   . POLYPECTOMY    . TEE WITHOUT CARDIOVERSION  12/28/2011   Procedure: TRANSESOPHAGEAL ECHOCARDIOGRAM (TEE);  Surgeon: Larey Dresser, MD;  Location[facility] ENDOSCOPY;  Service:  Cardiovascular;  Laterality: N/A;   Family History  Problem Relation Age of Onset  . Stroke Mother 14  . Hypertension Mother   . Diabetes Mother   . Diabetes Paternal Grandmother   . Lung cancer Father     smoker  . Diabetes Brother   . Heart attack Brother 39  . Diabetes Brother   . Hepatitis C Brother   . Throat cancer Paternal Uncle     1/2 uncle  . Heart attack Brother 13  . Crohn's disease Son   . Colon cancer Neg Hx   . Esophageal cancer Neg Hx   . Rectal cancer Neg Hx   . Stomach cancer Neg Hx    Social  History   Occupational History  . security guard    Social History Main Topics  . Smoking status: Current Every Day Smoker    Packs/day: 1.00    Years: 44.00    Types: Cigarettes  . Smokeless tobacco: Never Used     Comment: started @ age 31, up to 2 ppd;1.25-1.5 ppd as of 11/23/13  . Alcohol use 0.0 oz/week     Comment:  11/23/13 "2-3 drinks per year"  . Drug use: No  . Sexual activity: Not Currently   Tobacco Counseling Ready to quit: Not Answered Counseling given: Not Answered   Activities of Daily Living In your present state of health, do you have any difficulty performing the following activities: 01/05/2017 02/25/2016  Hearing? N N  Vision? N N  Difficulty concentrating or making decisions? N N  Walking or climbing stairs? N N  Dressing or bathing? N N  Doing errands, shopping? N N  Preparing Food and eating ? N -  Using the Toilet? N -  In the past six months, have you accidently leaked urine? N -  Do you have problems with loss of bowel control? N -  Managing your Medications? N -  Managing your Finances? N -  Housekeeping or managing your Housekeeping? N -  Some recent data might be hidden    Immunizations and Health Maintenance Immunization History  Administered Date(s) Administered  . Influenza Whole 08/08/2010  . Influenza, High Dose Seasonal PF 06/18/2016  . Influenza-Unspecified 06/20/2013, 06/22/2014, 06/22/2015  . Pneumococcal  Conjugate-13 05/29/2015  . Pneumococcal Polysaccharide-23 09/06/2013  . Tdap 04/21/2011   Health Maintenance Due  Topic Date Due  . Hepatitis C Screening  1951/02/16    Patient Care Team: Hoyt Koch, MD as PCP - General (Internal Medicine) Sherren Mocha, MD as Consulting Physician (Cardiology) Irine Seal, MD as Attending Physician (Urology) Elayne Snare, MD as Consulting Physician (Endocrinology)  Indicate any recent Medical Services you may have received from other than [facility] providers in the past year (date may be approximate).    Assessment:   This is a routine wellness examination for [place]. Physical assessment deferred to PCP.   Hearing/Vision screen  Visual Acuity Screening   Right eye Left eye Both eyes  Without correction:     With correction:   20/32  Comments: Wears glasses  Hearing Screening Comments: Able to hear conversational tones w/o difficulty. No issues reported. Passed whisper test   Dietary issues and exercise activities discussed: Current Exercise Habits: The patient does not participate in regular exercise at present (Discussed chair exercises and walking on patient's treadmill), Exercise limited by: respiratory conditions(s)  Diet (meal preparation, eat out, water intake, caffeinated beverages, dairy products, fruits and vegetables): Patient reports eating out frequently, he tries to limit salt, carbohydrates, and sugar but is not always compliant. Patient drinks 2 cups of coffee daily, 2-3 glasses of water daily.  Discussed reading labels to limit salt, sugar, carbohydrates, reviewed heart healthy and diabetic diet plan strategies and encouraged patient to increase water intake. Diet education was attached to patient's AVS  Goals    . Walk on treadmill three times a week for 5-10 minutes      Depression Screen PHQ 2/9 Scores 01/05/2017 09/30/2015  PHQ - 2 Score 0 0  PHQ- 9 Score 3 -    Fall Risk Fall Risk  01/05/2017 09/30/2015  Falls  in the past year? No Yes  Number falls in past yr: - 1  Injury with Fall? -  No    Cognitive Function: MMSE - Mini Mental State Exam 01/05/2017  Orientation to time 5  Orientation to Place 5  Registration 3  Attention/ Calculation 4  Recall 2  Language- name 2 objects 2  Language- repeat 1  Language- follow 3 step command 3  Language- read & follow direction 1  Write a sentence 1  Copy design 1  Total score 28        Screening Tests Health Maintenance  Topic Date Due  . Hepatitis C Screening  August 06, 1951  . INFLUENZA VACCINE  04/21/2017  . OPHTHALMOLOGY EXAM  06/04/2017  . HEMOGLOBIN A1C  06/09/2017  . FOOT EXAM  09/07/2017  . PNA vac Low Risk Adult (2 of 2 - PPSV23) 09/06/2018  . TETANUS/TDAP  04/20/2021  . COLONOSCOPY  03/03/2022        Plan:     Start to eat heart healthy diet (full of fruits, vegetables, whole grains, lean protein, water--limit salt, fat, and sugar intake) and increase physical activity as tolerated.  Continue doing brain stimulating activities (puzzles, reading, adult coloring books, staying active) to keep memory sharp.   Hep-C screening ordered today  During the course of the visit Munir was educated and counseled about the following appropriate screening and preventive services:   Vaccines to include Pneumoccal, Influenza, Hepatitis B, Td, Zostavax, HCV  Colorectal cancer screening  Cardiovascular disease screening  Diabetes screening  Glaucoma screening  Nutrition counseling  Prostate cancer screening  Patient Instructions (the written plan) were given to the patient.   Michiel Cowboy, RN   01/05/2017

## 2017-01-04 NOTE — Progress Notes (Signed)
Pre visit review using our clinic review tool, if applicable. No additional management support is needed unless otherwise documented below in the visit note. 

## 2017-01-05 ENCOUNTER — Ambulatory Visit (INDEPENDENT_AMBULATORY_CARE_PROVIDER_SITE_OTHER): Payer: Medicare Other | Admitting: *Deleted

## 2017-01-05 ENCOUNTER — Other Ambulatory Visit: Payer: Medicare Other

## 2017-01-05 VITALS — BP 116/64 | HR 96 | Resp 22 | Ht 71.0 in | Wt 272.0 lb

## 2017-01-05 DIAGNOSIS — Z1159 Encounter for screening for other viral diseases: Secondary | ICD-10-CM | POA: Diagnosis not present

## 2017-01-05 DIAGNOSIS — Z9189 Other specified personal risk factors, not elsewhere classified: Secondary | ICD-10-CM

## 2017-01-05 DIAGNOSIS — Z0000 Encounter for general adult medical examination without abnormal findings: Secondary | ICD-10-CM | POA: Diagnosis not present

## 2017-01-05 NOTE — Patient Instructions (Signed)
Start to eat heart healthy diet (full of fruits, vegetables, whole grains, lean protein, water--limit salt, fat, and sugar intake) and increase physical activity as tolerated.  Continue doing brain stimulating activities (puzzles, reading, adult coloring books, staying active) to keep memory sharp.    Neil Carroll , Thank you for taking time to come for your Medicare Wellness Visit. I appreciate your ongoing commitment to your health goals. Please review the following plan we discussed and let me know if I can assist you in the future.   These are the goals we discussed: Goals    . Walk on treadmill three times a week for 5-10 minutes       This is a list of the screening recommended for you and due dates:  Health Maintenance  Topic Date Due  .  Hepatitis C: One time screening is recommended by Center for Disease Control  (CDC) for  adults born from [birth year] through 1966.   1951/02/05  . Flu Shot  04/21/2017  . Eye exam for diabetics  06/04/2017  . Hemoglobin A1C  06/09/2017  . Complete foot exam   09/07/2017  . Pneumonia vaccines (2 of 2 - PPSV23) 09/06/2018  . Tetanus Vaccine  04/20/2021  . Colon Cancer Screening  03/03/2022    DASH Eating Plan DASH stands for "Dietary Approaches to Stop Hypertension." The DASH eating plan is a healthy eating plan that has been shown to reduce high blood pressure (hypertension). It may also reduce your risk for type 2 diabetes, heart disease, and stroke. The DASH eating plan may also help with weight loss. What are tips for following this plan? General guidelines   Avoid eating more than 2,300 mg (milligrams) of salt (sodium) a day. If you have hypertension, you may need to reduce your sodium intake to 1,500 mg a day.  Limit alcohol intake to no more than 1 drink a day for nonpregnant women and 2 drinks a day for men. One drink equals 12 oz of beer, 5 oz of Neil Carroll, or 1 oz of hard liquor.  Work with your health care provider to maintain a healthy body  weight or to lose weight. Ask what an ideal weight is for you.  Get at least 30 minutes of exercise that causes your heart to beat faster (aerobic exercise) most days of the week. Activities may include walking, swimming, or biking.  Work with your health care provider or diet and nutrition specialist (dietitian) to adjust your eating plan to your individual calorie needs. Reading food labels   Check food labels for the amount of sodium per serving. Choose foods with less than 5 percent of the Daily Value of sodium. Generally, foods with less than 300 mg of sodium per serving fit into this eating plan.  To find whole grains, look for the word "whole" as the first word in the ingredient list. Shopping   Buy products labeled as "low-sodium" or "no salt added."  Buy fresh foods. Avoid canned foods and premade or frozen meals. Cooking   Avoid adding salt when cooking. Use salt-free seasonings or herbs instead of table salt or sea salt. Check with your health care provider or pharmacist before using salt substitutes.  Do not fry foods. Cook foods using healthy methods such as baking, boiling, grilling, and broiling instead.  Cook with heart-healthy oils, such as olive, canola, soybean, or sunflower oil. Meal planning    Eat a balanced diet that includes:  5 or more servings of fruits and vegetables  each day. At each meal, try to fill half of your plate with fruits and vegetables.  Up to 6-8 servings of whole grains each day.  Less than 6 oz of lean meat, poultry, or fish each day. A 3-oz serving of meat is about the same size as a deck of cards. One egg equals 1 oz.  2 servings of low-fat dairy each day.  A serving of nuts, seeds, or beans 5 times each week.  Heart-healthy fats. Healthy fats called Omega-3 fatty acids are found in foods such as flaxseeds and coldwater fish, like sardines, salmon, and mackerel.  Limit how much you eat of the following:  Canned or prepackaged  foods.  Food that is high in trans fat, such as fried foods.  Food that is high in saturated fat, such as fatty meat.  Sweets, desserts, sugary drinks, and other foods with added sugar.  Full-fat dairy products.  Do not salt foods before eating.  Try to eat at least 2 vegetarian meals each week.  Eat more home-cooked food and less restaurant, buffet, and fast food.  When eating at a restaurant, ask that your food be prepared with less salt or no salt, if possible. What foods are recommended? The items listed may not be a complete list. Talk with your dietitian about what dietary choices are best for you. Grains  Whole-grain or whole-wheat bread. Whole-grain or whole-wheat pasta. Brown rice. Neil Carroll. Bulgur. Whole-grain and low-sodium cereals. Pita bread. Low-fat, low-sodium crackers. Whole-wheat flour tortillas. Vegetables  Fresh or frozen vegetables (raw, steamed, roasted, or grilled). Low-sodium or reduced-sodium tomato and vegetable juice. Low-sodium or reduced-sodium tomato sauce and tomato paste. Low-sodium or reduced-sodium canned vegetables. Fruits  All fresh, dried, or frozen fruit. Canned fruit in natural juice (without added sugar). Meat and other protein foods  Skinless chicken or [place]. Ground chicken or [place]. Pork with fat trimmed off. Fish and seafood. Egg whites. Dried beans, peas, or lentils. Unsalted nuts, nut butters, and seeds. Unsalted canned beans. Lean cuts of beef with fat trimmed off. Low-sodium, lean deli meat. Dairy  Low-fat (1%) or fat-free (skim) milk. Fat-free, low-fat, or reduced-fat cheeses. Nonfat, low-sodium ricotta or cottage cheese. Low-fat or nonfat yogurt. Low-fat, low-sodium cheese. Fats and oils  Soft margarine without trans fats. Vegetable oil. Low-fat, reduced-fat, or light mayonnaise and salad dressings (reduced-sodium). Canola, safflower, olive, soybean, and sunflower oils. Avocado. Seasoning and other foods  Herbs. Spices.  Seasoning mixes without salt. Unsalted popcorn and pretzels. Fat-free sweets. What foods are not recommended? The items listed may not be a complete list. Talk with your dietitian about what dietary choices are best for you. Grains  Baked goods made with fat, such as croissants, muffins, or some breads. Dry pasta or rice meal packs. Vegetables  Creamed or fried vegetables. Vegetables in a cheese sauce. Regular canned vegetables (not low-sodium or reduced-sodium). Regular canned tomato sauce and paste (not low-sodium or reduced-sodium). Regular tomato and vegetable juice (not low-sodium or reduced-sodium). Angie Fava. Olives. Fruits  Canned fruit in a light or heavy syrup. Fried fruit. Fruit in cream or butter sauce. Meat and other protein foods  Fatty cuts of meat. Ribs. Fried meat. Berniece Salines. Sausage. Bologna and other processed lunch meats. Salami. Fatback. Hotdogs. Bratwurst. Salted nuts and seeds. Canned beans with added salt. Canned or smoked fish. Whole eggs or egg yolks. Chicken or [place] with skin. Dairy  Whole or 2% milk, cream, and half-and-half. Whole or full-fat cream cheese. Whole-fat or sweetened yogurt. Full-fat cheese. Nondairy  creamers. Whipped toppings. Processed cheese and cheese spreads. Fats and oils  Butter. Stick margarine. Lard. Shortening. Ghee. Bacon fat. Tropical oils, such as coconut, palm kernel, or palm oil. Seasoning and other foods  Salted popcorn and pretzels. Onion salt, garlic salt, seasoned salt, table salt, and sea salt. Worcestershire sauce. Tartar sauce. Barbecue sauce. Teriyaki sauce. Soy sauce, including reduced-sodium. Steak sauce. Canned and packaged gravies. Fish sauce. Oyster sauce. Cocktail sauce. Horseradish that you find on the shelf. Ketchup. Mustard. Meat flavorings and tenderizers. Bouillon cubes. Hot sauce and Tabasco sauce. Premade or packaged marinades. Premade or packaged taco seasonings. Relishes. Regular salad dressings. Where to find more  information:  National Heart, Lung, and [place]: https://wilson-eaton.com/  American Heart Association: www.heart.org Summary  The DASH eating plan is a healthy eating plan that has been shown to reduce high blood pressure (hypertension). It may also reduce your risk for type 2 diabetes, heart disease, and stroke.  With the DASH eating plan, you should limit salt (sodium) intake to 2,300 mg a day. If you have hypertension, you may need to reduce your sodium intake to 1,500 mg a day.  When on the DASH eating plan, aim to eat more fresh fruits and vegetables, whole grains, lean proteins, low-fat dairy, and heart-healthy fats.  Work with your health care provider or diet and nutrition specialist (dietitian) to adjust your eating plan to your individual calorie needs. This information is not intended to replace advice given to you by your health care provider. Make sure you discuss any questions you have with your health care provider. Document Released: 08/27/2011 Document Revised: 08/31/2016 Document Reviewed: 08/31/2016 Elsevier Interactive Patient Education  2017 [place] and Cholesterol Restricted Diet Getting too much fat and cholesterol in your diet may cause health problems. Following this diet helps keep your fat and cholesterol at normal levels. This can keep you from getting sick. What types of fat should I choose?  Choose monosaturated and polyunsaturated fats. These are found in foods such as olive oil, canola oil, flaxseeds, walnuts, almonds, and seeds.  Eat more omega-3 fats. Good choices include salmon, mackerel, sardines, tuna, flaxseed oil, and ground flaxseeds.  Limit saturated fats. These are in animal products such as meats, butter, and cream. They can also be in plant products such as palm oil, palm kernel oil, and coconut oil.  Avoid foods with partially hydrogenated oils in them. These contain trans fats. Examples of foods that have trans fats are stick  margarine, some tub margarines, cookies, crackers, and other baked goods. What general guidelines do I need to follow?  Check food labels. Look for the words "trans fat" and "saturated fat."  When preparing a meal:  Fill half of your plate with vegetables and green salads.  Fill one fourth of your plate with whole grains. Look for the word "whole" as the first word in the ingredient list.  Fill one fourth of your plate with lean protein foods.  Eat more foods that have fiber, like apples, carrots, beans, peas, and barley.  Eat more home-cooked foods. Eat less at restaurants and buffets.  Limit or avoid alcohol.  Limit foods high in starch and sugar.  Limit fried foods.  Cook foods without frying them. Baking, boiling, grilling, and broiling are all great options.  Lose weight if you are overweight. Losing even a small amount of weight can help your overall health. It can also help prevent diseases such as diabetes and heart disease. What foods can I  eat? Grains  Whole grains, such as whole wheat or whole grain breads, crackers, cereals, and pasta. Unsweetened oatmeal, bulgur, barley, quinoa, or brown rice. Corn or whole wheat flour tortillas. Vegetables  Fresh or frozen vegetables (raw, steamed, roasted, or grilled). Green salads. Fruits  All fresh, canned (in natural juice), or frozen fruits. Meat and Other Protein Products  Ground beef (85% or leaner), grass-fed beef, or beef trimmed of fat. Skinless chicken or [place]. Ground chicken or [place]. Pork trimmed of fat. All fish and seafood. Eggs. Dried beans, peas, or lentils. Unsalted nuts or seeds. Unsalted canned or dry beans. Dairy  Low-fat dairy products, such as skim or 1% milk, 2% or reduced-fat cheeses, low-fat ricotta or cottage cheese, or plain low-fat yogurt. Fats and Oils  Tub margarines without trans fats. Light or reduced-fat mayonnaise and salad dressings. Avocado. Olive, canola, sesame, or safflower oils. Natural  peanut or almond butter (choose ones without added sugar and oil). The items listed above may not be a complete list of recommended foods or beverages. Contact your dietitian for more options.  What foods are not recommended? Grains  White bread. White pasta. White rice. Cornbread. Bagels, pastries, and croissants. Crackers that contain trans fat. Vegetables  White potatoes. Corn. Creamed or fried vegetables. Vegetables in a cheese sauce. Fruits  Dried fruits. Canned fruit in light or heavy syrup. Fruit juice. Meat and Other Protein Products  Fatty cuts of meat. Ribs, chicken wings, bacon, sausage, bologna, salami, chitterlings, fatback, hot dogs, bratwurst, and packaged luncheon meats. Liver and organ meats. Dairy  Whole or 2% milk, cream, half-and-half, and cream cheese. Whole milk cheeses. Whole-fat or sweetened yogurt. Full-fat cheeses. Nondairy creamers and whipped toppings. Processed cheese, cheese spreads, or cheese curds. Sweets and Desserts  Corn syrup, sugars, honey, and molasses. Candy. Jam and jelly. Syrup. Sweetened cereals. Cookies, pies, cakes, donuts, muffins, and ice cream. Fats and Oils  Butter, stick margarine, lard, shortening, ghee, or bacon fat. Coconut, palm kernel, or palm oils. Beverages  Alcohol. Sweetened drinks (such as sodas, lemonade, and fruit drinks or punches). The items listed above may not be a complete list of foods and beverages to avoid. Contact your dietitian for more information.  This information is not intended to replace advice given to you by your health care provider. Make sure you discuss any questions you have with your health care provider. Document Released: 03/08/2012 Document Revised: 05/14/2016 Document Reviewed: 12/07/2013 Elsevier Interactive Patient Education  2017 [place]. Insomnia Insomnia is a sleep disorder that makes it difficult to fall asleep or to stay asleep. Insomnia can cause tiredness (fatigue), low energy, difficulty  concentrating, mood swings, and poor performance at work or school. There are three different ways to classify insomnia:  Difficulty falling asleep.  Difficulty staying asleep.  Waking up too early in the morning. Any type of insomnia can be long-term (chronic) or short-term (acute). Both are common. Short-term insomnia usually lasts for three months or less. Chronic insomnia occurs at least three times a week for longer than three months. What are the causes? Insomnia may be caused by another condition, situation, or substance, such as:  Anxiety.  Certain medicines.  Gastroesophageal reflux disease (GERD) or other gastrointestinal conditions.  Asthma or other breathing conditions.  Restless legs syndrome, sleep apnea, or other sleep disorders.  Chronic pain.  Menopause. This may include hot flashes.  Stroke.  Abuse of alcohol, tobacco, or illegal drugs.  Depression.  Caffeine.  Neurological disorders, such as Alzheimer disease.  An overactive thyroid (hyperthyroidism). The cause of insomnia may not be known. What increases the risk? Risk factors for insomnia include:  Gender. Women are more commonly affected than men.  Age. Insomnia is more common as you get older.  Stress. This may involve your professional or personal life.  Income. Insomnia is more common in people with lower income.  Lack of exercise.  Irregular work schedule or night shifts.  Traveling between different time zones. What are the signs or symptoms? If you have insomnia, trouble falling asleep or trouble staying asleep is the main symptom. This may lead to other symptoms, such as:  Feeling fatigued.  Feeling nervous about going to sleep.  Not feeling rested in the morning.  Having trouble concentrating.  Feeling irritable, anxious, or depressed. How is this treated? Treatment for insomnia depends on the cause. If your insomnia is caused by an underlying condition, treatment will  focus on addressing the condition. Treatment may also include:  Medicines to help you sleep.  Counseling or therapy.  Lifestyle adjustments. Follow these instructions at home:  Take medicines only as directed by your health care provider.  Keep regular sleeping and waking hours. Avoid naps.  Keep a sleep diary to help you and your health care provider figure out what could be causing your insomnia. Include:  When you sleep.  When you wake up during the night.  How well you sleep.  How rested you feel the next day.  Any side effects of medicines you are taking.  What you eat and drink.  Make your bedroom a comfortable place where it is easy to fall asleep:  Put up shades or special blackout curtains to block light from outside.  Use a white noise machine to block noise.  Keep the temperature cool.  Exercise regularly as directed by your health care provider. Avoid exercising right before bedtime.  Use relaxation techniques to manage stress. Ask your health care provider to suggest some techniques that may work well for you. These may include:  Breathing exercises.  Routines to release muscle tension.  Visualizing peaceful scenes.  Cut back on alcohol, caffeinated beverages, and cigarettes, especially close to bedtime. These can disrupt your sleep.  Do not overeat or eat spicy foods right before bedtime. This can lead to digestive discomfort that can make it hard for you to sleep.  Limit screen use before bedtime. This includes:  Watching TV.  Using your smartphone, tablet, and computer.  Stick to a routine. This can help you fall asleep faster. Try to do a quiet activity, brush your teeth, and go to bed at the same time each night.  Get out of bed if you are still awake after 15 minutes of trying to sleep. Keep the lights down, but try reading or doing a quiet activity. When you feel sleepy, go back to bed.  Make sure that you drive carefully. Avoid driving  if you feel very sleepy.  Keep all follow-up appointments as directed by your health care provider. This is important. Contact a health care provider if:  You are tired throughout the day or have trouble in your daily routine due to sleepiness.  You continue to have sleep problems or your sleep problems get worse. Get help right away if:  You have serious thoughts about hurting yourself or someone else. This information is not intended to replace advice given to you by your health care provider. Make sure you discuss any questions you have with your health care  provider. Document Released: 09/04/2000 Document Revised: 02/07/2016 Document Reviewed: 06/08/2014 Elsevier Interactive Patient Education  2017 [place] for Diabetes Mellitus, Adult Carbohydrate counting is a method for keeping track of how many carbohydrates you eat. Eating carbohydrates naturally increases the amount of sugar (glucose) in the blood. Counting how many carbohydrates you eat helps keep your blood glucose within normal limits, which helps you manage your diabetes (diabetes mellitus). It is important to know how many carbohydrates you can safely have in each meal. This is different for every person. A diet and nutrition specialist (registered dietitian) can help you make a meal plan and calculate how many carbohydrates you should have at each meal and snack. Carbohydrates are found in the following foods:  Grains, such as breads and cereals.  Dried beans and soy products.  Starchy vegetables, such as potatoes, peas, and corn.  Fruit and fruit juices.  Milk and yogurt.  Sweets and snack foods, such as cake, cookies, candy, chips, and soft drinks. How do I count carbohydrates? There are two ways to count carbohydrates in food. You can use either of the methods or a combination of both. Reading "Nutrition Facts" on packaged food  The "Nutrition Facts" list is included on the labels of  almost all packaged foods and beverages in the [place] It includes:  The serving size.  Information about nutrients in each serving, including the grams (g) of carbohydrate per serving. To use the "Nutrition Facts":  Decide how many servings you will have.  Multiply the number of servings by the number of carbohydrates per serving.  The resulting number is the total amount of carbohydrates that you will be having. Learning standard serving sizes of other foods  When you eat foods containing carbohydrates that are not packaged or do not include "Nutrition Facts" on the label, you need to measure the servings in order to count the amount of carbohydrates:  Measure the foods that you will eat with a food scale or measuring cup, if needed.  Decide how many standard-size servings you will eat.  Multiply the number of servings by 15. Most carbohydrate-rich foods have about 15 g of carbohydrates per serving.  For example, if you eat 8 oz (170 g) of strawberries, you will have eaten 2 servings and 30 g of carbohydrates (2 servings x 15 g = 30 g).  For foods that have more than one food mixed, such as soups and casseroles, you must count the carbohydrates in each food that is included. The following list contains standard serving sizes of common carbohydrate-rich foods. Each of these servings has about 15 g of carbohydrates:   hamburger bun or  English muffin.   oz (15 mL) syrup.   oz (14 g) jelly.  1 slice of bread.  1 six-inch tortilla.  3 oz (85 g) cooked rice or pasta.  4 oz (113 g) cooked dried beans.  4 oz (113 g) starchy vegetable, such as peas, corn, or potatoes.  4 oz (113 g) hot cereal.  4 oz (113 g) mashed potatoes or  of a large baked potato.  4 oz (113 g) canned or frozen fruit.  4 oz (120 mL) fruit juice.  4-6 crackers.  6 chicken nuggets.  6 oz (170 g) unsweetened dry cereal.  6 oz (170 g) plain fat-free yogurt or yogurt sweetened with artificial  sweeteners.  8 oz (240 mL) milk.  8 oz (170 g) fresh fruit or one small piece of fruit.  24 oz (  680 g) popped popcorn. Example of carbohydrate counting Sample meal   3 oz (85 g) chicken breast.  6 oz (170 g) brown rice.  4 oz (113 g) corn.  8 oz (240 mL) milk.  8 oz (170 g) strawberries with sugar-free whipped topping. Carbohydrate calculation  1. Identify the foods that contain carbohydrates:  Rice.  Corn.  Milk.  Strawberries. 2. Calculate how many servings you have of each food:  2 servings rice.  1 serving corn.  1 serving milk.  1 serving strawberries. 3. Multiply each number of servings by 15 g:  2 servings rice x 15 g = 30 g.  1 serving corn x 15 g = 15 g.  1 serving milk x 15 g = 15 g.  1 serving strawberries x 15 g = 15 g. 4. Add together all of the amounts to find the total grams of carbohydrates eaten:  30 g + 15 g + 15 g + 15 g = 75 g of carbohydrates total. This information is not intended to replace advice given to you by your health care provider. Make sure you discuss any questions you have with your health care provider. Document Released: 09/07/2005 Document Revised: 03/27/2016 Document Reviewed: 02/19/2016 Elsevier Interactive Patient Education  2017 [employer].

## 2017-01-06 LAB — HEPATITIS C ANTIBODY: HCV Ab: NEGATIVE

## 2017-01-08 ENCOUNTER — Ambulatory Visit (INDEPENDENT_AMBULATORY_CARE_PROVIDER_SITE_OTHER): Payer: Medicare Other | Admitting: Cardiovascular Disease

## 2017-01-08 ENCOUNTER — Encounter: Payer: Self-pay | Admitting: Cardiovascular Disease

## 2017-01-08 VITALS — BP 114/66 | HR 88 | Ht 71.0 in | Wt 273.0 lb

## 2017-01-08 DIAGNOSIS — I481 Persistent atrial fibrillation: Secondary | ICD-10-CM | POA: Diagnosis not present

## 2017-01-08 DIAGNOSIS — I25709 Atherosclerosis of coronary artery bypass graft(s), unspecified, with unspecified angina pectoris: Secondary | ICD-10-CM

## 2017-01-08 DIAGNOSIS — I5032 Chronic diastolic (congestive) heart failure: Secondary | ICD-10-CM

## 2017-01-08 DIAGNOSIS — I4819 Other persistent atrial fibrillation: Secondary | ICD-10-CM

## 2017-01-08 MED ORDER — LOSARTAN POTASSIUM 25 MG PO TABS: 25.0000 mg | ORAL_TABLET | Freq: Every day | ORAL | 3 refills | Status: DC

## 2017-01-08 MED ORDER — DILTIAZEM HCL ER COATED BEADS 180 MG PO CP24: 180.0000 mg | ORAL_CAPSULE | Freq: Two times a day (BID) | ORAL | 3 refills | Status: DC

## 2017-01-08 NOTE — Progress Notes (Signed)
Medical screening examination/treatment/procedure(s) were performed by non-physician practitioner and as supervising physician I was immediately available for consultation/collaboration. I agree with above. Elizabeth A Crawford, MD 

## 2017-01-08 NOTE — Patient Instructions (Signed)
Medication Instructions:  Your physician has recommended you make the following change in your medication:  1.  INCREASE Cartia XT to '180mg'$  take one tablet by mouth twice a day  Labwork: No new orders.   Testing/Procedures: No new orders.   Follow-Up: Your physician recommends that you schedule a follow-up appointment in: 3 MONTHS with Richardson Dopp PA-C  Your physician wants you to follow-up in: 6 MONTHS with Dr Burt Knack.  You will receive a reminder letter in the mail two months in advance. If you don't receive a letter, please call our office to schedule the follow-up appointment.   Any Other Special Instructions Will Be Listed Below (If Applicable).     If you need a refill on your cardiac medications before your next appointment, please call your pharmacy.

## 2017-01-08 NOTE — Progress Notes (Signed)
Cardiology Office Note Date:  01/08/2017   ID:  Neil Clinch., DOB [birth year], MRN [id number]  PCP:  Hoyt Koch, MD  Cardiologist:  Sherren Mocha, MD    Chief Complaint  Patient presents with  . Follow-up    monitor     History of Present Illness: Neil Carroll. is a 66 y.o. male who presents for follow-up of chronic diastolic heart failure. Other problems include atrial flutter status post RFCA, PAD with bilateral SFA occlusions, CAD treated medically,  sleep apnea, DM, ongoing tobacco abuse, COPD. The patient has had recurrent atrial fibrillation/flutter and underwent cardioversion in 10/17. This was unsuccessful. Rate control strategy has been pursued. Last seen here by Richardson Dopp 11/20/2016.  The patient is here alone today. He continues to have problems with shortness of breath and fatigue. No major change in symptoms. Also c/o leg swelling. No orthopnea or PND. Tolerating anticoagulation without bleeding problems. He is sedentary. Continues to smoke. Was evaluated for syncope about 2 months ago - no further episodes. Event monitor is reviewed today.   Past Medical History:  Diagnosis Date  . Adenomatous colon polyp   . Aortic insufficiency    Echo 3/18: Severe concentric LVH, EF 60-65, normal wall motion, moderate AI, mild LAE, mild TR  . Arthritis    left hip replacement  . Atrial flutter ([place])    onset  2011. s/p EPS/RFA 12/2011  . Cataract   . Chronic bronchitis   . Contact dermatitis and other eczema due to plants (except food)   . COPD (chronic obstructive pulmonary disease) ([place])   . GERD (gastroesophageal reflux disease)   . Hyperlipidemia   . Hypertension   . LV dysfunction    EF 45-50% 12/2011  . OSA (obstructive sleep apnea) 10/13/2016   Mild with AHI 9.7/hr with significant oxygen desaturations as low as 76% now on CPAP  . Other peripheral vascular disease(443.89)    bilateral lower extremity  . Tobacco use disorder    dependent  . Transient global amnesia   . Tuberculosis   . Type II diabetes mellitus ([place])     Past Surgical History:  Procedure Laterality Date  . A FLUTTER ABLATION N/A 12/28/2011   Procedure: ABLATION A FLUTTER;  Surgeon: Evans Lance, MD;  Location: [facility] CATH LAB;  Service: Cardiovascular;  Laterality: N/A;  . CARDIAC CATHETERIZATION N/A 07/10/2015   Procedure: Left Heart Cath and Coronary Angiography;  Surgeon: Sherren Mocha, MD;  Location: [place] CV LAB;  Service: Cardiovascular;  Laterality: N/A;  . CARDIAC ELECTROPHYSIOLOGY MAPPING AND ABLATION  03/2010  . CARDIOVERSION N/A 07/13/2016   Procedure: CARDIOVERSION;  Surgeon: Skeet Latch, MD;  Location: [place];  Service: Cardiovascular;  Laterality: N/A;  . COLONOSCOPY    . colonoscopy with polypectomy  2006 & 2011   Dr [name]  . CYSTOSCOPY  11/2007   Dr Jeffie Pollock  . PARTIAL HIP ARTHROPLASTY Right 01/2000   "Right; replaced ball & stem"  . PARTIAL HIP ARTHROPLASTY Left   . POLYPECTOMY    . TEE WITHOUT CARDIOVERSION  12/28/2011   Procedure: TRANSESOPHAGEAL ECHOCARDIOGRAM (TEE);  Surgeon: Larey Dresser, MD;  Location: [facility] ENDOSCOPY;  Service: Cardiovascular;  Laterality: N/A;    Current Outpatient Prescriptions  Medication Sig Dispense Refill  . acetaminophen (TYLENOL) 650 MG CR tablet Take 650 mg by mouth every 8 (eight) hours as needed for pain.    [name] CARTIA XT 180 MG 24 hr capsule take 1 capsule by mouth once  daily 90 capsule 3  . cilostazol (PLETAL) 100 MG tablet take 1 tablet by mouth twice a day 180 tablet 1  . clobetasol (OLUX) 0.05 % topical foam Apply topically 2 (two) times daily.   0  . collagenase (SANTYL) ointment Apply 1 application topically daily.    [name] etanercept (ENBREL SURECLICK) 50 MG/ML injection Inject 50 mg into the skin 2 (two) times a week. Inject on Sundays & Wednesdays    . Flaxseed, Linseed, (FLAX SEED OIL) 1000 MG CAPS Take 1 capsule by mouth 2 (two) times daily.     . Fluticasone-Salmeterol  (ADVAIR) 100-50 MCG/DOSE AEPB Inhale 1 puff into the lungs 2 (two) times daily. 180 each 1  . furosemide (LASIX) 40 MG tablet Take 2 tablets (80 mg total) by mouth 2 (two) times daily. 120 tablet 11  . LEVEMIR FLEXTOUCH 100 UNIT/ML Pen INJECT 24 UNITS            SUBCUTANEOUSLY DAILY 30 mL 1  . Liraglutide (VICTOZA) 18 MG/3ML SOPN INJECT 0.3ML (=1.'8MG'$ )      SUBCUTANEOUSLY DAILY 27 mL 1  . losartan (COZAAR) 25 MG tablet Take 1 tablet (25 mg total) by mouth daily.    . metFORMIN (GLUCOPHAGE) 1000 MG tablet Take 1,000 mg by mouth 2 (two) times daily with a meal.    . metolazone (ZAROXOLYN) 2.5 MG tablet Take 1 tablet (2.5 mg total) by mouth as directed. TAKE 1 TABLET TWICE A WEEK 30 tablet 1  . metoprolol (LOPRESSOR) 100 MG tablet Take 100 mg by mouth every morning. And take 1 and 1/2 tablets by mouth every evening    . Multiple Vitamins-Minerals (CENTRUM ADULTS) TABS Take 1 tablet by mouth daily.    . mupirocin ointment (BACTROBAN) 2 % Apply 1 application topically 2 (two) times daily as needed (affected areas).     . NOVOFINE 32G X 6 MM MISC USE 2 DAILY WITH LEVEMIR AND VICTOZA 100 each 5  . Omega-3 Fatty Acids (FISH OIL) 1000 MG CAPS Take 2 capsules by mouth 2 (two) times daily.     . potassium chloride SA (K-DUR,KLOR-CON) 20 MEQ tablet Take 1 tablet (20 mEq total) by mouth 2 (two) times daily. 60 tablet 11  . rivaroxaban (XARELTO) 20 MG TABS tablet Take 20 mg by mouth daily with supper.    . rosuvastatin (CRESTOR) 10 MG tablet Take 1 tablet (10 mg total) by mouth daily. 90 tablet 3  . tamsulosin (FLOMAX) 0.4 MG CAPS capsule Take 0.4 mg by mouth daily.     [name] triamcinolone cream (KENALOG) 0.1 % Apply 1 application topically 2 (two) times daily as needed (affected area).      No current facility-administered medications for this visit.     Allergies:   Niacin   Social History:  The patient  reports that he has been smoking Cigarettes.  He has a 44.00 pack-year smoking history. He has never used  smokeless tobacco. He reports that he drinks alcohol. He reports that he does not use drugs.   Family History:  The patient's  family history includes Crohn's disease in his son; Diabetes in his brother, brother, mother, and paternal grandmother; Heart attack (age of onset: 23) in his brother; Heart attack (age of onset: 55) in his brother; Hepatitis C in his brother; Hypertension in his mother; Lung cancer in his father; Stroke (age of onset: 79) in his mother; Throat cancer in his paternal uncle.    ROS:  Please see the history of present illness.  All other systems are reviewed and negative.    PHYSICAL EXAM: VS:  BP 114/66   Pulse 88   Ht '5\' 11"'$  (1.803 m)   Wt 273 lb (123.8 kg)   BMI 38.08 kg/m  , BMI Body mass index is 38.08 kg/m. GEN: Well nourished, well developed, in no acute distress  HEENT: normal  Neck: no JVD, no masses. No carotid bruits Cardiac: irregularly irreular without murmur or gallop                Respiratory:  clear to auscultation bilaterally, normal work of breathing GI: soft, nontender, nondistended, + BS MS: no deformity or atrophy  Ext:2+ pretibial edema, pedal pulses 2+= bilaterally Skin: warm and dry, no rash Neuro:  Strength and sensation are intact Psych: euthymic mood, full affect  EKG:  EKG is not ordered today.   Recent Labs: 02/25/2016: B Natriuretic Peptide 226.9 02/28/2016: Magnesium 2.4 07/13/2016: Hemoglobin 17.3 09/17/2016: ALT 17 11/20/2016: Platelets 145 12/04/2016: NT-Pro BNP 628 12/21/2016: BUN 15; Creatinine, Ser 1.46; Potassium 3.7; Sodium 139   Lipid Panel     Component Value Date/Time   CHOL 110 05/29/2015 1116   TRIG 96.0 05/29/2015 1116   HDL 38.20 (L) 05/29/2015 1116   CHOLHDL 3 05/29/2015 1116   VLDL 19.2 05/29/2015 1116   LDLCALC 53 05/29/2015 1116      Wt Readings from Last 3 Encounters:  01/08/17 273 lb (123.8 kg)  01/05/17 272 lb (123.4 kg)  12/07/16 275 lb (124.7 kg)     Cardiac Studies Reviewed: 2D Echo  11-24-2016: Study Conclusions  - Left ventricle: The cavity size was normal. There was severe   concentric hypertrophy. Systolic function was normal. The   estimated ejection fraction was in the range of 60% to 65%. Wall   motion was normal; there were no regional wall motion   abnormalities. The study was not technically sufficient to allow   evaluation of LV diastolic dysfunction due to atrial   fibrillation. - Aortic valve: Trileaflet; normal thickness leaflets. There was   moderate regurgitation. - Left atrium: The atrium was mildly dilated. - Right ventricle: Systolic function was normal. - Right atrium: The atrium was normal in size. - Tricuspid valve: There was mild regurgitation. - Pulmonary arteries: Systolic pressure was within the normal   range. - Inferior vena cava: The vessel was normal in size. The   respirophasic diameter changes were in the normal range (= 50%),   consistent with normal central venous pressure. - Pericardium, extracardiac: There was no pericardial effusion.  Event monitor 11/2016: Study Highlights   Atrial fibrillation is the basic rhythm Ventricular rates consistently elevated with average heart rates 100-115 bpm No pathologic pauses No ventricular arrhythmia     LHC 10/16 LAD irregularities LCx distal 80 RCA mid 100-CTO EF 55-65  Carotid US 1/14 Plaque without significant ICA stenosis bilaterally  ASSESSMENT AND PLAN: 1.  Persistent atrial fibrillation: CHADS-Vasc = 5 for HTN, DM, CHF, age 66-75, and CAD. Anticoagulated with Xarelto. Heart rate control is suboptimal - event monitor shows persistent HR > 100 bpm. Will increase cardizem CD to 240 mg BID. FU Richardson Dopp 3 months.  2. Chronic diastolic CHF: last echo reviewed. Continue diuretic regimen with high-dose furosemide and twice weekly metolazone. Low Na diet discussed. No changes made today other than increased dilt for better rate control of AF.  3. CAD, native vessel: with  angina. Currently asymptomatic on his antianginal program.  4. Tobacco abuse: not able to quit.  5. Hypertensive heart disease with CHF: BP well controlled.   Current medicines are reviewed with the patient today.  The patient does not have concerns regarding medicines.  Labs/ tests ordered today include:  No orders of the defined types were placed in this encounter.   Disposition:   FU as above  Signed, Sherren Mocha, MD  01/08/2017 [facility] [address], [place], [place]  [address] Phone: [phone]; Fax: [phone]

## 2017-01-15 ENCOUNTER — Encounter: Payer: Self-pay | Admitting: Adult Health

## 2017-01-15 ENCOUNTER — Ambulatory Visit (INDEPENDENT_AMBULATORY_CARE_PROVIDER_SITE_OTHER): Payer: Medicare Other | Admitting: Adult Health

## 2017-01-15 ENCOUNTER — Ambulatory Visit (INDEPENDENT_AMBULATORY_CARE_PROVIDER_SITE_OTHER)
Admission: RE | Admit: 2017-01-15 | Discharge: 2017-01-15 | Disposition: A | Discharge status: Home / Self Care | Payer: Medicare Other | Source: Ambulatory Visit | Attending: Adult Health | Admitting: Adult Health

## 2017-01-15 DIAGNOSIS — J9611 Chronic respiratory failure with hypoxia: Secondary | ICD-10-CM

## 2017-01-15 DIAGNOSIS — I25709 Atherosclerosis of coronary artery bypass graft(s), unspecified, with unspecified angina pectoris: Secondary | ICD-10-CM | POA: Diagnosis not present

## 2017-01-15 DIAGNOSIS — I5032 Chronic diastolic (congestive) heart failure: Secondary | ICD-10-CM

## 2017-01-15 DIAGNOSIS — G4733 Obstructive sleep apnea (adult) (pediatric): Secondary | ICD-10-CM | POA: Diagnosis not present

## 2017-01-15 DIAGNOSIS — J9610 Chronic respiratory failure, unspecified whether with hypoxia or hypercapnia: Secondary | ICD-10-CM | POA: Insufficient documentation

## 2017-01-15 DIAGNOSIS — R05 Cough: Secondary | ICD-10-CM | POA: Diagnosis not present

## 2017-01-15 DIAGNOSIS — J42 Unspecified chronic bronchitis: Secondary | ICD-10-CM

## 2017-01-15 NOTE — Assessment & Plan Note (Signed)
NO significant airflow obstruction but moderate restriction - may be component of obesity +/- emphysema  Check cxr.  Remain on Advair for now . [name] He denies significant sx although is somewhat stoic /sedentary . Could consider LABA/LAMA on return if symptomatic or frequent flares.  Smoking cessation encouarged.

## 2017-01-15 NOTE — Assessment & Plan Note (Signed)
Cont on CPAP q/ O2 at 4l/m  Check ONO on 4l/m if desats, will need titration study .

## 2017-01-15 NOTE — Patient Instructions (Addendum)
Check chest xray today  Continue on CPAP At bedtime  w/ O2 at 4l/m  Set up ONO on CPAP with oxgyen .  Work on not smoking .  Begin Oxygen 4l/m with walking /activity .  Continue on Advair 1 puff Twice daily  .  follow up [name]  In 6-8 weeks and As needed

## 2017-01-15 NOTE — Addendum Note (Signed)
Addended by: Parke Poisson E on: 01/15/2017 11:45 AM   Modules accepted: Orders

## 2017-01-15 NOTE — Assessment & Plan Note (Signed)
Appears euvolemic cont on diuretics per cards.

## 2017-01-15 NOTE — Progress Notes (Signed)
$'@Patient'K$  ID: Neil Mam., male    DOB: 03-16-1951, 66 y.o.   MRN: [id number]  Chief Complaint  Patient presents with  . Follow-up    Bronchitis     Referring provider: Hoyt Koch, *  HPI: 66 year old male, active smoker, seen for pulmonary consult 10/19/2016 for COPD Patient has chronic diastolic heart failure, chronic A. fib. Followed by cardiology Patient has obstructive sleep apnea on C Pap Psorasis on Enbrel    TEST  Echo 01/2016 shows normal LVEF with RVSP 39 05/2016 PSG showed mild OSA with AHI 10/hour with lowest desaturation of 76%-on a subsequent titration study, CPAP was titrated to 13 cm with good control of events and he needed 2 L of oxygen to correct for nocturnal hypoxia Spirometry surprisingly showed no evidence of airway obstruction with a ratio of 82 and FEV1 of 73% FVC of 68% suggestive of moderate restriction. 12/2016 ONO on CPAP + desats , O2 increased to 4l/m w/ CPAP    01/15/2017 Follow up; COPD/ OSA on CPAP w/ O2  Pt returns for 3 month follow up . He was seen for pulmonary consult for COPD and OSA in Jan 2018. Spirometry showed moderate restriction .  Remains on Advair . Denies increased sob/doe. Says he is not very active, says he is not motivated and does not want to do things. Is able to mow grass (riding mower) and weed eats.  He continues to smoke, says no desire or will power to quit.   Says his oxygen runs low if he walks for last 3 years. If he rests and takes in deep breaths O2 sats >905. f On arrival to office O2 sats 84% on room . Walk test  With O2 sats 84% walking and required 4l/m to keep sats >90% walking .   Pt is on CPAP At bedtime  With O2 . Last ov ONO on CPAP was done and showed desats on O2 2l/m/CPAP . He was recommended to increase O2 at 4l/ mw/ CPAP . Says he does feel he can sleep better. CPAP Download requested.   Pt has chronic diastolic CHF on  lasix 80 Twice daily  . On Zaroxolyn twice weekly  .Followed by  Cards. Has  A fib on Xarelto .       Allergies  Allergen Reactions  . Niacin Nausea Only    REACTION: upset stomach    Immunization History  Administered Date(s) Administered  . Influenza Whole 08/08/2010  . Influenza, High Dose Seasonal PF 06/18/2016  . Influenza-Unspecified 06/20/2013, 06/22/2014, 06/22/2015  . Pneumococcal Conjugate-13 05/29/2015  . Pneumococcal Polysaccharide-23 09/06/2013  . Tdap 04/21/2011    Past Medical History:  Diagnosis Date  . Adenomatous colon polyp   . Aortic insufficiency    Echo 3/18: Severe concentric LVH, EF 60-65, normal wall motion, moderate AI, mild LAE, mild TR  . Arthritis    left hip replacement  . Atrial flutter ([place])    onset  2011. s/p EPS/RFA 12/2011  . Cataract   . Chronic bronchitis   . Contact dermatitis and other eczema due to plants (except food)   . COPD (chronic obstructive pulmonary disease) ([place])   . GERD (gastroesophageal reflux disease)   . Hyperlipidemia   . Hypertension   . LV dysfunction    EF 45-50% 12/2011  . OSA (obstructive sleep apnea) 10/13/2016   Mild with AHI 9.7/hr with significant oxygen desaturations as low as 76% now on CPAP  . Other peripheral vascular disease(443.89)  bilateral lower extremity  . Tobacco use disorder    dependent  . Transient global amnesia   . Tuberculosis   . Type II diabetes mellitus ([facility])     Tobacco History: History  Smoking Status  . Current Every Day Smoker  . Packs/day: 1.00  . Years: 44.00  . Types: Cigarettes  Smokeless Tobacco  . Never Used    Comment: started @ age 72, up to 2 ppd;1.25-1.5 ppd as of 11/23/13   Ready to quit: No Counseling given: Yes   Outpatient Encounter Prescriptions as of 01/15/2017  Medication Sig  . acetaminophen (TYLENOL) 650 MG CR tablet Take 650 mg by mouth every 8 (eight) hours as needed for pain.  . cilostazol (PLETAL) 100 MG tablet take 1 tablet by mouth twice a day  . clobetasol (OLUX) 0.05 % topical foam Apply topically 2  (two) times daily.   . collagenase (SANTYL) ointment Apply 1 application topically daily.  [name] diltiazem (CARTIA XT) 180 MG 24 hr capsule Take 1 capsule (180 mg total) by mouth 2 (two) times daily.  [name] etanercept (ENBREL SURECLICK) 50 MG/ML injection Inject 50 mg into the skin 2 (two) times a week. Inject on Sundays & Wednesdays  . Flaxseed, Linseed, (FLAX SEED OIL) 1000 MG CAPS Take 1 capsule by mouth 2 (two) times daily.   . Fluticasone-Salmeterol (ADVAIR) 100-50 MCG/DOSE AEPB Inhale 1 puff into the lungs 2 (two) times daily.  . furosemide (LASIX) 40 MG tablet Take 2 tablets (80 mg total) by mouth 2 (two) times daily.  [name] LEVEMIR FLEXTOUCH 100 UNIT/ML Pen INJECT 24 UNITS            SUBCUTANEOUSLY DAILY  . Liraglutide (VICTOZA) 18 MG/3ML SOPN INJECT 0.3ML (=1.'8MG'$ )      SUBCUTANEOUSLY DAILY  . losartan (COZAAR) 25 MG tablet Take 1 tablet (25 mg total) by mouth daily.  . metFORMIN (GLUCOPHAGE) 1000 MG tablet Take 1,000 mg by mouth 2 (two) times daily with a meal.  . metolazone (ZAROXOLYN) 2.5 MG tablet Take 1 tablet (2.5 mg total) by mouth as directed. TAKE 1 TABLET TWICE A WEEK  . metoprolol (LOPRESSOR) 100 MG tablet Take 100 mg by mouth every morning. And take 1 and 1/2 tablets by mouth every evening  . Multiple Vitamins-Minerals (CENTRUM ADULTS) TABS Take 1 tablet by mouth daily.  . mupirocin ointment (BACTROBAN) 2 % Apply 1 application topically 2 (two) times daily as needed (affected areas).   . NOVOFINE 32G X 6 MM MISC USE 2 DAILY WITH LEVEMIR AND VICTOZA  . Omega-3 Fatty Acids (FISH OIL) 1000 MG CAPS Take 2 capsules by mouth 2 (two) times daily.   . potassium chloride SA (K-DUR,KLOR-CON) 20 MEQ tablet Take 1 tablet (20 mEq total) by mouth 2 (two) times daily.  . rivaroxaban (XARELTO) 20 MG TABS tablet Take 20 mg by mouth daily with supper.  . rosuvastatin (CRESTOR) 10 MG tablet Take 1 tablet (10 mg total) by mouth daily.  . tamsulosin (FLOMAX) 0.4 MG CAPS capsule Take 0.4 mg by mouth daily.     [name] triamcinolone cream (KENALOG) 0.1 % Apply 1 application topically 2 (two) times daily as needed (affected area).    No facility-administered encounter medications on file as of 01/15/2017.      Review of Systems  Constitutional:   No  weight loss, night sweats,  Fevers, chills, fatigue, or  lassitude.  HEENT:   No headaches,  Difficulty swallowing,  Tooth/dental problems, or  Sore throat,  No sneezing, itching, ear ache, nasal congestion, post nasal drip,   CV:  No chest pain,  Orthopnea, PND,   anasarca, dizziness, palpitations, syncope.   GI  No heartburn, indigestion, abdominal pain, nausea, vomiting, diarrhea, change in bowel habits, loss of appetite, bloody stools.   Resp:   No non-productive cough,  No coughing up of blood.  No change in color of mucus.  No wheezing.  No chest wall deformity  Skin: no rash or lesions.  GU: no dysuria, change in color of urine, no urgency or frequency.  No flank pain, no hematuria   MS:  No joint pain or swelling.  No decreased range of motion.  No back pain.    Physical Exam  BP 122/74 (BP Location: Right Arm, Cuff Size: Normal)   Pulse 77   Ht '5\' 11"'$  (1.803 m)   Wt 271 lb 9.6 oz (123.2 kg)   SpO2 90%   BMI 37.88 kg/m   GEN: A/Ox3; pleasant , NAD, elderly , obese    HEENT:  [place]/AT,  EACs-clear, TMs-wnl, NOSE-clear, THROAT-clear, no lesions, no postnasal drip or exudate noted.   NECK:  Supple w/ fair ROM; no JVD; normal carotid impulses w/o bruits; no thyromegaly or nodules palpated; no lymphadenopathy.    RESP  Clear  P & A; w/o, wheezes/ rales/ or rhonchi. no accessory muscle use, no dullness to percussion  CARD:  RRR, no m/r/g, 1+  peripheral edema, brawny skin, stasis dermatasis , pulses intact, no cyanosis or clubbing.  GI:   Soft & nt; nml bowel sounds; no organomegaly or masses detected.   Musco: Warm bil, no deformities or joint swelling noted.   Neuro: alert, no focal deficits noted.    Skin: Warm, no  lesions or rashes    Lab Results:  CBC  Imaging: No results found.   Assessment & Plan:   COPD (chronic obstructive pulmonary disease) ([place]) NO significant airflow obstruction but moderate restriction - may be component of obesity +/- emphysema  Check cxr.  Remain on Advair for now . [name] He denies significant sx although is somewhat stoic /sedentary . Could consider LABA/LAMA on return if symptomatic or frequent flares.  Smoking cessation encouarged.   Chronic diastolic heart failure (H[facility]) Appears euvolemic cont on diuretics per cards.   OSA (obstructive sleep apnea) Cont on CPAP q/ O2 at 4l/m  Check ONO on 4l/m if desats, will need titration study .   Chronic respiratory failure ([facility]) Chronic hypoxic Resp failure , it appears he continues to have exertional hypoxemia . With his underlying CHF would recommend to begin O2 at 4l/m with walking  And continue on 4l/m w. CPAP At bedtime  .       Rexene Edison, NP 01/15/2017

## 2017-01-15 NOTE — Assessment & Plan Note (Signed)
Chronic hypoxic Resp failure , it appears he continues to have exertional hypoxemia . With his underlying CHF would recommend to begin O2 at 4l/m with walking  And continue on 4l/m w. CPAP At bedtime  .

## 2017-01-15 NOTE — Addendum Note (Signed)
Addended by: Parke Poisson E on: 01/15/2017 05:38 PM   Modules accepted: Orders

## 2017-01-18 ENCOUNTER — Other Ambulatory Visit: Payer: Self-pay | Admitting: Cardiology

## 2017-01-18 NOTE — Progress Notes (Signed)
Reviewed & agree with plan  

## 2017-01-19 ENCOUNTER — Other Ambulatory Visit: Payer: Self-pay | Admitting: Endocrinology

## 2017-01-19 NOTE — Telephone Encounter (Signed)
Xarelto '20mg'$  request received; wt-123.2kg on 01/05/17, Crea-1.46 on 12/21/16, age-66 yrs old, CrCl-86.25m/min, last seen by CBurt Knackon 01/08/17. Will send in requested refill to requested pharmacy.

## 2017-01-21 ENCOUNTER — Other Ambulatory Visit: Payer: Self-pay

## 2017-01-21 DIAGNOSIS — J42 Unspecified chronic bronchitis: Secondary | ICD-10-CM

## 2017-01-21 DIAGNOSIS — J441 Chronic obstructive pulmonary disease with (acute) exacerbation: Secondary | ICD-10-CM

## 2017-01-21 DIAGNOSIS — J849 Interstitial pulmonary disease, unspecified: Secondary | ICD-10-CM

## 2017-01-27 ENCOUNTER — Ambulatory Visit (INDEPENDENT_AMBULATORY_CARE_PROVIDER_SITE_OTHER)
Admission: RE | Admit: 2017-01-27 | Discharge: 2017-01-27 | Disposition: A | Discharge status: Home / Self Care | Payer: Medicare Other | Source: Ambulatory Visit | Attending: Adult Health | Admitting: Adult Health

## 2017-01-27 ENCOUNTER — Encounter (INDEPENDENT_AMBULATORY_CARE_PROVIDER_SITE_OTHER): Payer: Self-pay

## 2017-01-27 ENCOUNTER — Telehealth: Payer: Self-pay | Admitting: Adult Health

## 2017-01-27 ENCOUNTER — Encounter: Payer: Self-pay | Admitting: Cardiology

## 2017-01-27 DIAGNOSIS — I5033 Acute on chronic diastolic (congestive) heart failure: Secondary | ICD-10-CM | POA: Diagnosis not present

## 2017-01-27 DIAGNOSIS — I700 Atherosclerosis of aorta: Secondary | ICD-10-CM | POA: Diagnosis not present

## 2017-01-27 DIAGNOSIS — G4733 Obstructive sleep apnea (adult) (pediatric): Secondary | ICD-10-CM | POA: Diagnosis not present

## 2017-01-27 DIAGNOSIS — J849 Interstitial pulmonary disease, unspecified: Secondary | ICD-10-CM

## 2017-01-27 DIAGNOSIS — J42 Unspecified chronic bronchitis: Secondary | ICD-10-CM

## 2017-01-27 DIAGNOSIS — J441 Chronic obstructive pulmonary disease with (acute) exacerbation: Secondary | ICD-10-CM | POA: Diagnosis not present

## 2017-01-27 NOTE — Telephone Encounter (Signed)
Erline Levine with [facility] Radiology calling with a call report on the CT scan completed.  Please advise TP. Thanks.    IMPRESSION: 1. 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 Repeat high-resolution chest CT is suggested in 12 months to assess for temporal changes in the appearance of the lung parenchyma. 2. Aggressive appearing 3.0 x 1.6 x 3.4 cm mass in the periphery of the right upper lobe highly concerning for primary bronchogenic neoplasm. Further evaluation with PET-CT is recommended in the near future for diagnostic and staging purposes. 3. Mild diffuse bronchial wall thickening with moderate centrilobular and mild paraseptal emphysema, as well as mild air trapping; imaging findings compatible with the reported clinical history of COPD. 4. Dilatation of the pulmonic trunk (4.4 cm in diameter), suggestive of underlying pulmonary arterial hypertension. 5. Aortic atherosclerosis, in addition to left main and 3 vessel coronary artery disease. Please note that although the presence of coronary artery calcium documents the presence of coronary artery disease, the severity of this disease and any potential stenosis cannot be assessed on this non-gated CT examination. Assessment for potential risk factor modification, dietary therapy or pharmacologic therapy may be warranted, if clinically indicated. These results will be called to the ordering clinician or representative by the Radiologist Assistant, and communication documented in the PACS or zVision Dashboard.

## 2017-01-28 ENCOUNTER — Other Ambulatory Visit: Payer: Self-pay | Admitting: Adult Health

## 2017-01-28 DIAGNOSIS — R918 Other nonspecific abnormal finding of lung field: Secondary | ICD-10-CM

## 2017-01-28 NOTE — Progress Notes (Signed)
Orders only encounter created for the PET scan order

## 2017-01-28 NOTE — Telephone Encounter (Signed)
See result note attached to CT scan PET scan ordered Pt is aware Will sign off

## 2017-02-02 ENCOUNTER — Telehealth: Payer: Self-pay | Admitting: *Deleted

## 2017-02-02 DIAGNOSIS — G4734 Idiopathic sleep related nonobstructive alveolar hypoventilation: Secondary | ICD-10-CM

## 2017-02-02 DIAGNOSIS — R0902 Hypoxemia: Secondary | ICD-10-CM

## 2017-02-02 NOTE — Telephone Encounter (Signed)
Pulse Oximetry  Order: [id number]  Status:  Final result Visible to patient:  No (Not Released)  Notes recorded by Sueanne Margarita, MD on 01/29/2017 at 1:43 PM EDT Reviewed Clemetine Marker, NP note from pulmonary with instructions to get in lab O2 titration since he continues to have O2 desats on 4L. Please arrange ------  Notes recorded by Sueanne Margarita, MD on 01/29/2017 at 1:42 PM EDT Patient continues to have nocturnal hypoxemia despite CPAP and 4L at night. Please refer to Dr. Chase Caller, Halford Chessman or [place].     Last Resulted: 01/27/17 12:02             Scans on Order [id number]

## 2017-02-02 NOTE — Telephone Encounter (Signed)
This needs needs to be done ASAP and he still needs to be followed with Pulmonary

## 2017-02-03 NOTE — Telephone Encounter (Addendum)
In lab 02 titration scheduled for Monday April 05 2017. Pulmonary referral placed to Dr. Chase Caller.  Patient has an appointment with Dr Elsworth Soho in June at pulmonary office

## 2017-02-09 ENCOUNTER — Ambulatory Visit (HOSPITAL_COMMUNITY)
Admission: RE | Admit: 2017-02-09 | Discharge: 2017-02-09 | Disposition: A | Discharge status: Home / Self Care | Payer: Medicare Other | Source: Ambulatory Visit | Attending: Adult Health | Admitting: Adult Health

## 2017-02-09 DIAGNOSIS — R918 Other nonspecific abnormal finding of lung field: Secondary | ICD-10-CM | POA: Diagnosis not present

## 2017-02-09 DIAGNOSIS — J439 Emphysema, unspecified: Secondary | ICD-10-CM | POA: Insufficient documentation

## 2017-02-09 DIAGNOSIS — J8410 Pulmonary fibrosis, unspecified: Secondary | ICD-10-CM | POA: Insufficient documentation

## 2017-02-09 DIAGNOSIS — J984 Other disorders of lung: Secondary | ICD-10-CM | POA: Diagnosis not present

## 2017-02-09 DIAGNOSIS — I700 Atherosclerosis of aorta: Secondary | ICD-10-CM | POA: Diagnosis not present

## 2017-02-09 LAB — GLUCOSE, CAPILLARY: Glucose-Capillary: 145 mg/dL — ABNORMAL HIGH (ref 65–99)

## 2017-02-09 MED ORDER — FLUDEOXYGLUCOSE F - 18 (FDG) INJECTION
13.5700 | Freq: Once | INTRAVENOUS | Status: AC | PRN
  Administered 2017-02-09: 13.57 via INTRAVENOUS

## 2017-02-11 ENCOUNTER — Encounter: Payer: Self-pay | Admitting: Pulmonary Disease

## 2017-02-11 NOTE — Progress Notes (Signed)
Called spoke with patient - appt with RA scheduled for 5.29.18 @ 0900

## 2017-02-16 ENCOUNTER — Encounter: Payer: Self-pay | Admitting: Pulmonary Disease

## 2017-02-16 ENCOUNTER — Ambulatory Visit (INDEPENDENT_AMBULATORY_CARE_PROVIDER_SITE_OTHER): Payer: Medicare Other | Admitting: Pulmonary Disease

## 2017-02-16 VITALS — BP 118/74 | HR 98 | Ht 71.0 in | Wt 270.0 lb

## 2017-02-16 DIAGNOSIS — J84112 Idiopathic pulmonary fibrosis: Secondary | ICD-10-CM | POA: Insufficient documentation

## 2017-02-16 DIAGNOSIS — I25709 Atherosclerosis of coronary artery bypass graft(s), unspecified, with unspecified angina pectoris: Secondary | ICD-10-CM

## 2017-02-16 DIAGNOSIS — R918 Other nonspecific abnormal finding of lung field: Secondary | ICD-10-CM | POA: Diagnosis not present

## 2017-02-16 DIAGNOSIS — J42 Unspecified chronic bronchitis: Secondary | ICD-10-CM | POA: Diagnosis not present

## 2017-02-16 NOTE — Assessment & Plan Note (Signed)
I have asked him to be more compliant with oxygen during exertion. Smoking cessation was again emphasized

## 2017-02-16 NOTE — Assessment & Plan Note (Signed)
For the lung mass, we will arrange for you to have a biopsy The various options of biopsy including bronchoscopy, CT guided needle aspiration and surgical biopsy were discussed.The risks of each procedure including coughing, bleeding and the  chances of lung puncture requiring chest tube were discussed in great detail. The benefits & alternatives including serial follow up were also discussed.   He will have to stop Xarelto 4 days prior to procedure

## 2017-02-16 NOTE — Assessment & Plan Note (Signed)
Arrange for PFTs with DLCO testing to see if surgery would be an option in the future or not  Referral to pulmonary rehabilitation

## 2017-02-16 NOTE — Progress Notes (Signed)
   Subjective:    Patient ID: Neil Mam., male    DOB: 1950/12/19, 66 y.o.   MRN: [id number]  HPI  66 year old heavy smoker with chronic diastolic heart failure for FU of nocturnal hypoxia,  ILD and COPD. He has chronic atrial fibrillation, underwent DC CV in 06/2016.[name] He was hospitalized for acute pulmonary edema and 02/2016. He is maintained on Enbrel for psoriasis   Spirometry showed restriction. 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 He continues to smoke. CT also showed a lung mass and PET showed hypermetabolism and we requested CT-guided lung biopsy however radiologist was hesitant given underlying emphysema He is poorly compliant with portable oxygen, uses sedentary lifestyle   Significant tests/ events reviewed  Echo 01/2016 shows normal LVEF with RVSP 39 05/2016 PSG showed mild OSA with AHI 10/hour with lowest desaturation of 76%-on a subsequent titration study, CPAP was titrated to 13 cm with good control of events and he needed 2 L of oxygen to correct for nocturnal hypoxia  Spirometry 09/2016 >> no evidence of airway obstruction with a ratio of 82 and FEV1 of 73% FVC of 68% suggestive of moderate restriction.  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 Adenomatous colon polyp   . Aortic insufficiency    Echo 3/18: Severe concentric LVH, EF 60-65, normal wall motion, moderate AI, mild LAE, mild TR  . Arthritis    left hip replacement  . Atrial flutter ([place])    onset  2011. s/p EPS/RFA 12/2011  . Cataract   . Chronic bronchitis   . Contact dermatitis and other eczema due to plants (except food)   . COPD (chronic obstructive pulmonary disease) ([facility])   . GERD (gastroesophageal reflux disease)   . Hyperlipidemia   . Hypertension   . LV dysfunction    EF 45-50% 12/2011  . OSA (obstructive sleep apnea) 10/13/2016   Mild with AHI 9.7/hr with  significant oxygen desaturations as low as 76% now on CPAP  . Other peripheral vascular disease(443.89)    bilateral lower extremity  . Tobacco use disorder    dependent  . Transient global amnesia   . Tuberculosis   . Type II diabetes mellitus ([facility])     Review of Systems neg for any significant sore throat, dysphagia, itching, sneezing, nasal congestion or excess/ purulent secretions, fever, chills, sweats, unintended wt loss, pleuritic or exertional cp, hempoptysis, orthopnea pnd or change in chronic leg swelling. Also denies presyncope, palpitations, heartburn, abdominal pain, nausea, vomiting, diarrhea or change in bowel or urinary habits, dysuria,hematuria, rash, arthralgias, visual complaints, headache, numbness weakness or ataxia.     Objective:   Physical Exam  Gen. Pleasant, obese, in no distress, normal affect ENT - no lesions, no post nasal drip, class 2-3 airway Neck: No JVD, no thyromegaly, no carotid bruits Lungs: no use of accessory muscles, no dullness to percussion, decreased with bibasal rales, no rhonchi  Cardiovascular: Rhythm regular, heart sounds  normal, no murmurs or gallops, 2+ peripheral edema Abdomen: soft and non-tender, no hepatosplenomegaly, BS normal. Musculoskeletal: No deformities, no cyanosis or clubbing Neuro:  alert, non focal, no tremors       Assessment & Plan:

## 2017-02-16 NOTE — Patient Instructions (Signed)
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 For the lung mass, we will arrange for you to have a biopsy We discussed risks and benefits of the procedure.  He will have to stop Xarelto 4 days prior to procedure  Arrange for PFTs with DLCO testing to see if surgery would be an option in the future or not  Referral to pulmonary rehabilitation

## 2017-02-17 DIAGNOSIS — M79671 Pain in right foot: Secondary | ICD-10-CM | POA: Diagnosis not present

## 2017-02-17 DIAGNOSIS — I872 Venous insufficiency (chronic) (peripheral): Secondary | ICD-10-CM | POA: Diagnosis not present

## 2017-02-17 DIAGNOSIS — B351 Tinea unguium: Secondary | ICD-10-CM | POA: Diagnosis not present

## 2017-02-17 DIAGNOSIS — B353 Tinea pedis: Secondary | ICD-10-CM | POA: Diagnosis not present

## 2017-02-17 DIAGNOSIS — E1351 Other specified diabetes mellitus with diabetic peripheral angiopathy without gangrene: Secondary | ICD-10-CM | POA: Diagnosis not present

## 2017-02-17 DIAGNOSIS — M79672 Pain in left foot: Secondary | ICD-10-CM | POA: Diagnosis not present

## 2017-02-19 ENCOUNTER — Other Ambulatory Visit: Payer: Self-pay | Admitting: Student

## 2017-02-19 ENCOUNTER — Other Ambulatory Visit: Payer: Self-pay | Admitting: Radiology

## 2017-02-22 ENCOUNTER — Ambulatory Visit (HOSPITAL_COMMUNITY)
Admission: RE | Admit: 2017-02-22 | Discharge: 2017-02-22 | Disposition: A | Discharge status: Home / Self Care | Payer: Medicare Other | Source: Ambulatory Visit | Attending: Pulmonary Disease | Admitting: Pulmonary Disease

## 2017-02-22 ENCOUNTER — Encounter (HOSPITAL_COMMUNITY): Payer: Self-pay

## 2017-02-22 DIAGNOSIS — R918 Other nonspecific abnormal finding of lung field: Secondary | ICD-10-CM | POA: Diagnosis not present

## 2017-02-22 DIAGNOSIS — Z539 Procedure and treatment not carried out, unspecified reason: Secondary | ICD-10-CM | POA: Diagnosis not present

## 2017-02-22 LAB — GLUCOSE, CAPILLARY: Glucose-Capillary: 144 mg/dL — ABNORMAL HIGH (ref 65–99)

## 2017-02-22 MED ORDER — LIDOCAINE HCL 1 % IJ SOLN
INTRAMUSCULAR | Status: AC
  Filled 2017-02-22: qty 20

## 2017-02-22 MED ORDER — SODIUM CHLORIDE 0.9 % IV SOLN: INTRAVENOUS | Status: DC

## 2017-02-22 NOTE — Progress Notes (Signed)
Procedure cancelled per Dr. Barbie Banner. Schedulers will call pt at home to reschedule. D/C home in wheelchair with friend. Awake and alert. In no distress.

## 2017-02-23 ENCOUNTER — Telehealth: Payer: Self-pay | Admitting: *Deleted

## 2017-02-23 NOTE — Telephone Encounter (Signed)
Take extra Lasix 40 mg on days he would take Metolazone. Call if weight increases > 3 lbs in 1 day. Richardson Dopp, PA-C    02/23/2017 5:38 PM

## 2017-02-23 NOTE — Telephone Encounter (Signed)
I called back pt in regards to his question Asking for Metolazone 2.5 mg refill but the pharmacy said that this is on back order, please advise, thanks  (Routing comment) . Pt said he has Metolazone for about 2 weeks. I advised pt if Metolazone is still not in at the pharmacy then take Extra Lasix 40 on the days when he would have taken the Metolazone. Monitor weight daily and call if weight is up 3 lb's x 1 day. Pt is agreeable to plan of care and all instructions.

## 2017-02-23 NOTE — Telephone Encounter (Signed)
I will forward to [place] for further recommendations .

## 2017-02-24 ENCOUNTER — Other Ambulatory Visit: Payer: Self-pay | Admitting: General Surgery

## 2017-02-24 ENCOUNTER — Other Ambulatory Visit: Payer: Self-pay | Admitting: Cardiovascular Disease

## 2017-02-24 ENCOUNTER — Other Ambulatory Visit: Payer: Self-pay | Admitting: Student

## 2017-02-24 MED ORDER — METOLAZONE 2.5 MG PO TABS: 2.5000 mg | ORAL_TABLET | ORAL | 1 refills | Status: DC

## 2017-02-25 ENCOUNTER — Encounter (HOSPITAL_COMMUNITY): Payer: Self-pay

## 2017-02-25 ENCOUNTER — Ambulatory Visit (HOSPITAL_COMMUNITY)
Admission: RE | Admit: 2017-02-25 | Discharge: 2017-02-25 | Disposition: A | Discharge status: Home / Self Care | Payer: Medicare Other | Source: Ambulatory Visit | Attending: Pulmonary Disease | Admitting: Pulmonary Disease

## 2017-02-25 ENCOUNTER — Inpatient Hospital Stay (HOSPITAL_COMMUNITY)
Admission: RE | Admit: 2017-02-25 | Discharge: 2017-02-27 | DRG: 199 | Disposition: A | Discharge status: Home / Self Care | Payer: Medicare Other | Source: Ambulatory Visit | Attending: Interventional Radiology | Admitting: Interventional Radiology

## 2017-02-25 ENCOUNTER — Other Ambulatory Visit: Payer: Self-pay | Admitting: Pulmonary Disease

## 2017-02-25 ENCOUNTER — Ambulatory Visit (HOSPITAL_COMMUNITY)
Admission: RE | Admit: 2017-02-25 | Discharge: 2017-02-25 | Disposition: A | Discharge status: Home / Self Care | Payer: Medicare Other | Source: Ambulatory Visit | Attending: Interventional Radiology | Admitting: Interventional Radiology

## 2017-02-25 DIAGNOSIS — Z8249 Family history of ischemic heart disease and other diseases of the circulatory system: Secondary | ICD-10-CM

## 2017-02-25 DIAGNOSIS — Y848 Other medical procedures as the cause of abnormal reaction of the patient, or of later complication, without mention of misadventure at the time of the procedure: Secondary | ICD-10-CM | POA: Diagnosis present

## 2017-02-25 DIAGNOSIS — Y92238 Other place in hospital as the place of occurrence of the external cause: Secondary | ICD-10-CM | POA: Diagnosis present

## 2017-02-25 DIAGNOSIS — J449 Chronic obstructive pulmonary disease, unspecified: Secondary | ICD-10-CM | POA: Diagnosis not present

## 2017-02-25 DIAGNOSIS — J849 Interstitial pulmonary disease, unspecified: Secondary | ICD-10-CM

## 2017-02-25 DIAGNOSIS — Z9119 Patient's noncompliance with other medical treatment and regimen: Secondary | ICD-10-CM

## 2017-02-25 DIAGNOSIS — J9621 Acute and chronic respiratory failure with hypoxia: Secondary | ICD-10-CM | POA: Diagnosis present

## 2017-02-25 DIAGNOSIS — R911 Solitary pulmonary nodule: Secondary | ICD-10-CM | POA: Diagnosis not present

## 2017-02-25 DIAGNOSIS — J9601 Acute respiratory failure with hypoxia: Secondary | ICD-10-CM

## 2017-02-25 DIAGNOSIS — J939 Pneumothorax, unspecified: Secondary | ICD-10-CM

## 2017-02-25 DIAGNOSIS — K219 Gastro-esophageal reflux disease without esophagitis: Secondary | ICD-10-CM | POA: Diagnosis present

## 2017-02-25 DIAGNOSIS — E669 Obesity, unspecified: Secondary | ICD-10-CM | POA: Diagnosis present

## 2017-02-25 DIAGNOSIS — Z794 Long term (current) use of insulin: Secondary | ICD-10-CM

## 2017-02-25 DIAGNOSIS — Z808 Family history of malignant neoplasm of other organs or systems: Secondary | ICD-10-CM

## 2017-02-25 DIAGNOSIS — Z7901 Long term (current) use of anticoagulants: Secondary | ICD-10-CM

## 2017-02-25 DIAGNOSIS — Z9981 Dependence on supplemental oxygen: Secondary | ICD-10-CM

## 2017-02-25 DIAGNOSIS — F17210 Nicotine dependence, cigarettes, uncomplicated: Secondary | ICD-10-CM | POA: Diagnosis present

## 2017-02-25 DIAGNOSIS — R918 Other nonspecific abnormal finding of lung field: Secondary | ICD-10-CM | POA: Diagnosis present

## 2017-02-25 DIAGNOSIS — Z9889 Other specified postprocedural states: Secondary | ICD-10-CM

## 2017-02-25 DIAGNOSIS — E785 Hyperlipidemia, unspecified: Secondary | ICD-10-CM | POA: Diagnosis present

## 2017-02-25 DIAGNOSIS — I10 Essential (primary) hypertension: Secondary | ICD-10-CM | POA: Diagnosis present

## 2017-02-25 DIAGNOSIS — J8410 Pulmonary fibrosis, unspecified: Secondary | ICD-10-CM | POA: Diagnosis present

## 2017-02-25 DIAGNOSIS — Z96643 Presence of artificial hip joint, bilateral: Secondary | ICD-10-CM | POA: Diagnosis present

## 2017-02-25 DIAGNOSIS — J95811 Postprocedural pneumothorax: Principal | ICD-10-CM | POA: Diagnosis present

## 2017-02-25 DIAGNOSIS — Z801 Family history of malignant neoplasm of trachea, bronchus and lung: Secondary | ICD-10-CM

## 2017-02-25 DIAGNOSIS — Z7951 Long term (current) use of inhaled steroids: Secondary | ICD-10-CM

## 2017-02-25 DIAGNOSIS — G4733 Obstructive sleep apnea (adult) (pediatric): Secondary | ICD-10-CM | POA: Diagnosis present

## 2017-02-25 DIAGNOSIS — I4891 Unspecified atrial fibrillation: Secondary | ICD-10-CM | POA: Diagnosis present

## 2017-02-25 DIAGNOSIS — Z91048 Other nonmedicinal substance allergy status: Secondary | ICD-10-CM

## 2017-02-25 DIAGNOSIS — Z6837 Body mass index (BMI) 37.0-37.9, adult: Secondary | ICD-10-CM

## 2017-02-25 DIAGNOSIS — E1151 Type 2 diabetes mellitus with diabetic peripheral angiopathy without gangrene: Secondary | ICD-10-CM | POA: Diagnosis present

## 2017-02-25 DIAGNOSIS — C3491 Malignant neoplasm of unspecified part of right bronchus or lung: Secondary | ICD-10-CM | POA: Diagnosis not present

## 2017-02-25 DIAGNOSIS — Z4682 Encounter for fitting and adjustment of non-vascular catheter: Secondary | ICD-10-CM | POA: Diagnosis not present

## 2017-02-25 LAB — CBC
HEMATOCRIT: 50.3 % (ref 39.0–52.0)
HEMOGLOBIN: 16.9 g/dL (ref 13.0–17.0)
MCH: 31.1 pg (ref 26.0–34.0)
MCHC: 33.6 g/dL (ref 30.0–36.0)
MCV: 92.6 fL (ref 78.0–100.0)
Platelets: 139 10*3/uL — ABNORMAL LOW (ref 150–400)
RBC: 5.43 MIL/uL (ref 4.22–5.81)
RDW: 13.5 % (ref 11.5–15.5)
WBC: 8.9 10*3/uL (ref 4.0–10.5)

## 2017-02-25 LAB — PROTIME-INR
INR: 1.01
Prothrombin Time: 13.3 seconds (ref 11.4–15.2)

## 2017-02-25 LAB — APTT: aPTT: 33 seconds (ref 24–36)

## 2017-02-25 LAB — HEPARIN LEVEL (UNFRACTIONATED): Heparin Unfractionated: 0.33 IU/mL (ref 0.30–0.70)

## 2017-02-25 MED ORDER — BUDESONIDE 0.5 MG/2ML IN SUSP
0.5000 mg | Freq: Two times a day (BID) | RESPIRATORY_TRACT | Status: DC
  Administered 2017-02-26 – 2017-02-27 (×3): 0.5 mg via RESPIRATORY_TRACT
  Filled 2017-02-25 (×4): qty 2

## 2017-02-25 MED ORDER — FUROSEMIDE 80 MG PO TABS
80.0000 mg | ORAL_TABLET | Freq: Two times a day (BID) | ORAL | Status: DC
  Administered 2017-02-25 – 2017-02-27 (×3): 80 mg via ORAL
  Filled 2017-02-25 (×4): qty 1

## 2017-02-25 MED ORDER — ONDANSETRON HCL 4 MG PO TABS: 4.0000 mg | ORAL_TABLET | Freq: Four times a day (QID) | ORAL | Status: DC | PRN

## 2017-02-25 MED ORDER — SODIUM CHLORIDE 0.9 % IV SOLN
INTRAVENOUS | Status: DC
  Administered 2017-02-25: 14:00:00 via INTRAVENOUS

## 2017-02-25 MED ORDER — ARFORMOTEROL TARTRATE 15 MCG/2ML IN NEBU
15.0000 ug | INHALATION_SOLUTION | Freq: Two times a day (BID) | RESPIRATORY_TRACT | Status: DC
  Administered 2017-02-26 – 2017-02-27 (×3): 15 ug via RESPIRATORY_TRACT
  Filled 2017-02-25 (×3): qty 2

## 2017-02-25 MED ORDER — MIDAZOLAM HCL 2 MG/2ML IJ SOLN
INTRAMUSCULAR | Status: AC | PRN
  Administered 2017-02-25: 0.5 mg via INTRAVENOUS

## 2017-02-25 MED ORDER — SODIUM CHLORIDE 0.9% FLUSH
3.0000 mL | Freq: Two times a day (BID) | INTRAVENOUS | Status: DC
  Administered 2017-02-26 – 2017-02-27 (×2): 3 mL via INTRAVENOUS

## 2017-02-25 MED ORDER — METOPROLOL TARTRATE 50 MG PO TABS
150.0000 mg | ORAL_TABLET | Freq: Every day | ORAL | Status: DC
  Administered 2017-02-25 – 2017-02-26 (×2): 150 mg via ORAL
  Filled 2017-02-25 (×3): qty 1

## 2017-02-25 MED ORDER — HEPARIN (PORCINE) IN NACL 100-0.45 UNIT/ML-% IJ SOLN
1550.0000 [IU]/h | INTRAMUSCULAR | Status: DC
  Administered 2017-02-25 – 2017-02-26 (×2): 1400 [IU]/h via INTRAVENOUS
  Administered 2017-02-27: 1550 [IU]/h via INTRAVENOUS
  Filled 2017-02-25 (×3): qty 250

## 2017-02-25 MED ORDER — POTASSIUM CHLORIDE CRYS ER 20 MEQ PO TBCR
20.0000 meq | EXTENDED_RELEASE_TABLET | Freq: Two times a day (BID) | ORAL | Status: DC
  Administered 2017-02-25 – 2017-02-27 (×5): 20 meq via ORAL
  Filled 2017-02-25 (×5): qty 1

## 2017-02-25 MED ORDER — IPRATROPIUM BROMIDE 0.02 % IN SOLN
0.5000 mg | Freq: Four times a day (QID) | RESPIRATORY_TRACT | Status: DC
  Administered 2017-02-25 (×2): 0.5 mg via RESPIRATORY_TRACT
  Filled 2017-02-25 (×2): qty 2.5

## 2017-02-25 MED ORDER — LIRAGLUTIDE 18 MG/3ML ~~LOC~~ SOPN: 1.8000 mg | PEN_INJECTOR | Freq: Every day | SUBCUTANEOUS | Status: DC

## 2017-02-25 MED ORDER — INSULIN DETEMIR 100 UNIT/ML ~~LOC~~ SOLN
26.0000 [IU] | Freq: Every day | SUBCUTANEOUS | Status: DC
  Administered 2017-02-25 – 2017-02-26 (×2): 26 [IU] via SUBCUTANEOUS
  Filled 2017-02-25 (×3): qty 0.26

## 2017-02-25 MED ORDER — ACETAMINOPHEN 325 MG PO TABS: 650.0000 mg | ORAL_TABLET | Freq: Three times a day (TID) | ORAL | Status: DC | PRN

## 2017-02-25 MED ORDER — IPRATROPIUM BROMIDE 0.02 % IN SOLN
0.5000 mg | Freq: Three times a day (TID) | RESPIRATORY_TRACT | Status: DC
  Administered 2017-02-26 – 2017-02-27 (×5): 0.5 mg via RESPIRATORY_TRACT
  Filled 2017-02-25 (×5): qty 2.5

## 2017-02-25 MED ORDER — FENTANYL CITRATE (PF) 100 MCG/2ML IJ SOLN
INTRAMUSCULAR | Status: AC
  Filled 2017-02-25: qty 2

## 2017-02-25 MED ORDER — LOSARTAN POTASSIUM 25 MG PO TABS
25.0000 mg | ORAL_TABLET | Freq: Every day | ORAL | Status: DC
  Administered 2017-02-25 – 2017-02-27 (×3): 25 mg via ORAL
  Filled 2017-02-25 (×3): qty 1

## 2017-02-25 MED ORDER — METOLAZONE 2.5 MG PO TABS: 2.5000 mg | ORAL_TABLET | ORAL | Status: DC

## 2017-02-25 MED ORDER — TAMSULOSIN HCL 0.4 MG PO CAPS
0.4000 mg | ORAL_CAPSULE | Freq: Every day | ORAL | Status: DC
  Administered 2017-02-25 – 2017-02-27 (×3): 0.4 mg via ORAL
  Filled 2017-02-25 (×3): qty 1

## 2017-02-25 MED ORDER — MIDAZOLAM HCL 2 MG/2ML IJ SOLN
INTRAMUSCULAR | Status: AC
  Filled 2017-02-25: qty 2

## 2017-02-25 MED ORDER — HYDROCODONE-ACETAMINOPHEN 5-325 MG PO TABS: 1.0000 | ORAL_TABLET | ORAL | Status: DC | PRN

## 2017-02-25 MED ORDER — FENTANYL CITRATE (PF) 100 MCG/2ML IJ SOLN
INTRAMUSCULAR | Status: AC | PRN
  Administered 2017-02-25: 25 ug via INTRAVENOUS

## 2017-02-25 MED ORDER — DILTIAZEM HCL ER COATED BEADS 180 MG PO CP24
180.0000 mg | ORAL_CAPSULE | Freq: Two times a day (BID) | ORAL | Status: DC
  Administered 2017-02-25 – 2017-02-27 (×5): 180 mg via ORAL
  Filled 2017-02-25 (×5): qty 1

## 2017-02-25 MED ORDER — LIDOCAINE-EPINEPHRINE 1 %-1:100000 IJ SOLN
INTRAMUSCULAR | Status: AC
  Filled 2017-02-25: qty 1

## 2017-02-25 MED ORDER — METFORMIN HCL 500 MG PO TABS
1000.0000 mg | ORAL_TABLET | Freq: Two times a day (BID) | ORAL | Status: DC
  Administered 2017-02-25 – 2017-02-27 (×4): 1000 mg via ORAL
  Filled 2017-02-25 (×4): qty 2

## 2017-02-25 MED ORDER — METOPROLOL TARTRATE 100 MG PO TABS
100.0000 mg | ORAL_TABLET | Freq: Every day | ORAL | Status: DC
  Administered 2017-02-26 – 2017-02-27 (×2): 100 mg via ORAL
  Filled 2017-02-25 (×3): qty 1

## 2017-02-25 MED ORDER — ONDANSETRON HCL 4 MG/2ML IJ SOLN: 4.0000 mg | Freq: Four times a day (QID) | INTRAMUSCULAR | Status: DC | PRN

## 2017-02-25 MED ORDER — SODIUM CHLORIDE 0.9 % IV SOLN: INTRAVENOUS | Status: DC

## 2017-02-25 MED ORDER — ROSUVASTATIN CALCIUM 10 MG PO TABS
10.0000 mg | ORAL_TABLET | Freq: Every day | ORAL | Status: DC
  Administered 2017-02-25 – 2017-02-27 (×3): 10 mg via ORAL
  Filled 2017-02-25 (×3): qty 1

## 2017-02-25 NOTE — Sedation Documentation (Signed)
Bed control called to request bed.

## 2017-02-25 NOTE — Progress Notes (Signed)
ANTICOAGULATION CONSULT NOTE - Initial Consult  Pharmacy Consult for heparin Indication: atrial fibrillation  Allergies  Allergen Reactions  . Niacin Nausea Only    REACTION: upset stomach    Patient Measurements: Height: 5\' 11"  (180.3 cm) Weight: 264 lb (119.7 kg) IBW/kg (Calculated) : 75.3 Heparin Dosing Weight: 102 Kg  Vital Signs: Temp: 98.5 F (36.9 C) (06/07 1308) Temp Source: Oral (06/07 1308) BP: 129/76 (06/07 1308) Pulse Rate: 84 (06/07 1308)  Labs:  Recent Labs  02/25/17 0605  HGB 16.9  HCT 50.3  PLT 139*  APTT 33  LABPROT 13.3  INR 1.01    CrCl cannot be calculated (Patient's most recent lab result is older than the maximum 21 days allowed.).   Medical History: Past Medical History:  Diagnosis Date  . Adenomatous colon polyp   . Aortic insufficiency    Echo 3/18: Severe concentric LVH, EF 60-65, normal wall motion, moderate AI, mild LAE, mild TR  . Arthritis    left hip replacement  . Atrial flutter ([place])    onset  2011. s/p EPS/RFA 12/2011  . Cataract   . Chronic bronchitis   . Contact dermatitis and other eczema due to plants (except food)   . COPD (chronic obstructive pulmonary disease) ([place])   . GERD (gastroesophageal reflux disease)   . Hyperlipidemia   . Hypertension   . LV dysfunction    EF 45-50% 12/2011  . OSA (obstructive sleep apnea) 10/13/2016   Mild with AHI 9.7/hr with significant oxygen desaturations as low as 76% now on CPAP  . Other peripheral vascular disease(443.89)    bilateral lower extremity  . Tobacco use disorder    dependent  . Transient global amnesia   . Tuberculosis   . Type II diabetes mellitus (H[facility])     Medications:  Prescriptions Prior to Admission  Medication Sig Dispense Refill Last Dose  . cilostazol (PLETAL) 100 MG tablet take 1 tablet by mouth twice a day 180 tablet 1 Past Week at Unknown time  . diltiazem (CARTIA XT) 180 MG 24 hr capsule Take 1 capsule (180 mg total) by mouth 2 (two) times daily.  180 capsule 3 02/24/2017 at 2100  . etanercept (ENBREL SURECLICK) 50 MG/ML injection Inject 50 mg into the skin 2 (two) times a week. Inject on Sundays & Wednesdays   02/24/2017 at Unknown time  . Flaxseed, Linseed, (FLAX SEED OIL) 1000 MG CAPS Take 1 capsule by mouth 2 (two) times daily.    02/24/2017 at 2100  . Fluticasone-Salmeterol (ADVAIR) 100-50 MCG/DOSE AEPB Inhale 1 puff into the lungs 2 (two) times daily. 180 each 1 02/24/2017 at 2100  . furosemide (LASIX) 40 MG tablet Take 2 tablets (80 mg total) by mouth 2 (two) times daily. 120 tablet 11 02/24/2017 at 2100  . insulin detemir (LEVEMIR) 100 UNIT/ML injection Inject 26 Units into the skin at bedtime.   02/24/2017 at 1800  . losartan (COZAAR) 25 MG tablet Take 1 tablet (25 mg total) by mouth daily. 90 tablet 3 02/24/2017 at 0800  . metFORMIN (GLUCOPHAGE) 1000 MG tablet Take 1,000 mg by mouth 2 (two) times daily with a meal.   02/24/2017 at 2100  . metolazone (ZAROXOLYN) 2.5 MG tablet Take 1 tablet (2.5 mg total) by mouth as directed. TAKE 1 TABLET TWICE A WEEK 30 tablet 1 02/24/2017 at Unknown time  . metoprolol (LOPRESSOR) 100 MG tablet Take 100-150 mg by mouth See admin instructions. 1 tablet every morning And take 1 and 1/2 tablets by mouth  every evening   02/24/2017 at 2100  . Multiple Vitamins-Minerals (CENTRUM ADULTS) TABS Take 1 tablet by mouth daily.   02/24/2017 at 0800  . Omega-3 Fatty Acids (FISH OIL) 1000 MG CAPS Take 2 capsules by mouth 2 (two) times daily.    02/24/2017 at 2100  . potassium chloride SA (K-DUR,KLOR-CON) 20 MEQ tablet Take 1 tablet (20 mEq total) by mouth 2 (two) times daily. 60 tablet 11 02/24/2017 at 2100  . rivaroxaban (XARELTO) 20 MG TABS tablet Take 20 mg by mouth every morning.    Past Week at Unknown time  . rosuvastatin (CRESTOR) 10 MG tablet Take 1 tablet (10 mg total) by mouth daily. 90 tablet 3 02/24/2017 at 0800  . tamsulosin (FLOMAX) 0.4 MG CAPS capsule Take 0.4 mg by mouth daily.    02/24/2017 at 2100  . VICTOZA 18 MG/3ML SOPN  INJECT 0.3ML (=1.8MG )      SUBCUTANEOUSLY DAILY 27 mL 1 02/24/2017 at 2100  . acetaminophen (TYLENOL) 650 MG CR tablet Take 650 mg by mouth every 8 (eight) hours as needed for pain.   Taking  . clobetasol (OLUX) 0.05 % topical foam Apply 1 application topically 2 (two) times daily as needed.   0 Unknown at Unknown time  . collagenase (SANTYL) ointment Apply 1 application topically daily as needed.    Unknown at Unknown time  . mupirocin ointment (BACTROBAN) 2 % Apply 1 application topically 2 (two) times daily as needed (affected areas).    Unknown at Unknown time  . triamcinolone cream (KENALOG) 0.1 % Apply 1 application topically 2 (two) times daily as needed (affected area).    Unknown at Unknown time    Assessment: Mr Beigel is a 66 y.o. male on  Xarelto prior to admission for a fib who presents for a biopsy of a right lung lesion. Xarelto has been held for anticipated biopsy and aPTT this morning was 33. Pharmacy has been asked to dose heparin while the chest tube is in place. HgB WNL, PLT 139.   Goal of Therapy:  Heparin level 0.3-0.7 units/ml Monitor platelets by anticoagulation protocol: Yes   Plan:  Start heparin infusion at 1400 units/hr Check anti-Xa level in 8 hours and daily while on heparin Continue to monitor H&H and platelets  Georga Bora, PharmD Clinical Pharmacist 02/25/2017 1:36 PM

## 2017-02-25 NOTE — Progress Notes (Signed)
Patient arrived on the unit from IR, assessment completed see flowsheet, patient oriented to room and staff, placed on tele ccmd notified, bed in lowest position, call bell within reach will continue to monitor.

## 2017-02-25 NOTE — Procedures (Signed)
Pre procedural Dx: Hypermetabolic pulmonary nodule  Post procedural Dx: Same  Technically successful CT guided biopsy of indeterminate hypermetabolic pulmonary nodule   EBL: None.   Complications: Procedure complicated by development of a rapidly expanding PTX, requiring urgent chest tube placement.  Ronny Bacon, MD Pager #: [phone]

## 2017-02-25 NOTE — Consult Note (Signed)
Name: Neil Carroll. MRN: [id number] DOB: Jan 03, 1951    ADMISSION DATE:  02/25/2017 CONSULTATION DATE:  02/25/17  REFERRING MD :  Pascal Lux  CHIEF COMPLAINT:  Pneumothorax s/p lung biopsy   HISTORY OF PRESENT ILLNESS:  Neil Carroll. is a 66 y.o. 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 as outpatient.  He has long smoking history and continues to smoke (no desire or will power to quit). He had recent CT which demonstrated a right lung mass.  PET scan showed hypermetabolic lesion for which he was referred to IR for biopsy.  On 6/7, he was admitted for biopsy and procedure unfortunately complicated by rapidly expanding right pneumothorax requiring emergent chest tube placement.  Given his chronic lung disease and concerns for delayed healing of PTX, PCCM was asked to see in consultation.  PAST MEDICAL HISTORY :   has a past medical history of Adenomatous colon polyp; Aortic insufficiency; Arthritis; Atrial flutter ([place]); Cataract; Chronic bronchitis; Contact dermatitis and other eczema due to plants (except food); COPD (chronic obstructive pulmonary disease) ([place]); GERD (gastroesophageal reflux disease); Hyperlipidemia; Hypertension; LV dysfunction; OSA (obstructive sleep apnea) (10/13/2016); Other peripheral vascular disease(443.89); Tobacco use disorder; Transient global amnesia; Tuberculosis; and Type II diabetes mellitus ([place]).  has a past surgical history that includes Partial hip arthroplasty (Right, 01/2000); Partial hip arthroplasty (Left); Cystoscopy (11/2007); Cardiac electrophysiology mapping and ablation (03/2010); TEE without cardioversion (12/28/2011); colonoscopy with polypectomy (2006 & 2011); A flutter ablation (N/A, 12/28/2011); Colonoscopy;  Polypectomy; Cardiac catheterization (N/A, 07/10/2015); and Cardioversion (N/A, 07/13/2016). Prior to Admission medications   Medication Sig Start Date End Date Taking? Authorizing Provider  cilostazol (PLETAL) 100 MG tablet take 1 tablet by mouth twice a day 11/19/16  Yes Hoyt Koch, MD  diltiazem (CARTIA XT) 180 MG 24 hr capsule Take 1 capsule (180 mg total) by mouth 2 (two) times daily. 01/08/17  Yes Sherren Mocha, MD  etanercept (ENBREL SURECLICK) 50 MG/ML injection Inject 50 mg into the skin 2 (two) times a week. Inject on Sundays & Wednesdays   Yes [provider]  Flaxseed, Linseed, (FLAX SEED OIL) 1000 MG CAPS Take 1 capsule by mouth 2 (two) times daily.    Yes [provider]  Fluticasone-Salmeterol (ADVAIR) 100-50 MCG/DOSE AEPB Inhale 1 puff into the lungs 2 (two) times daily. 10/22/16  Yes Hoyt Koch, MD  furosemide (LASIX) 40 MG tablet Take 2 tablets (80 mg total) by mouth 2 (two) times daily. 04/22/16  Yes Imogene Burn, PA-C  insulin detemir (LEVEMIR) 100 UNIT/ML injection Inject 26 Units into the skin at bedtime.   Yes [provider]  losartan (COZAAR) 25 MG tablet Take 1 tablet (25 mg total) by mouth daily. 01/08/17 04/08/17 Yes Sherren Mocha, MD  metFORMIN (GLUCOPHAGE) 1000 MG tablet Take 1,000 mg by mouth 2 (two) times daily with a meal.   Yes [provider]  metolazone (ZAROXOLYN) 2.5 MG tablet Take 1 tablet (2.5 mg total) by mouth as directed. TAKE 1 TABLET TWICE A WEEK 02/24/17 05/25/17 Yes Sherren Mocha, MD  metoprolol (LOPRESSOR) 100 MG tablet Take 100-150 mg by mouth See admin instructions. 1 tablet every morning And take 1 and 1/2 tablets by mouth every evening   Yes [provider]  Multiple Vitamins-Minerals (CENTRUM ADULTS)  TABS Take 1 tablet by mouth daily.   Yes [provider]  Omega-3 Fatty Acids (FISH OIL) 1000 MG CAPS Take 2 capsules by mouth 2 (two) times daily.    Yes [provider]   potassium chloride SA (K-DUR,KLOR-CON) 20 MEQ tablet Take 1 tablet (20 mEq total) by mouth 2 (two) times daily. 04/22/16  Yes Imogene Burn, PA-C  rivaroxaban (XARELTO) 20 MG TABS tablet Take 20 mg by mouth every morning.    Yes [provider]  rosuvastatin (CRESTOR) 10 MG tablet Take 1 tablet (10 mg total) by mouth daily. 09/17/16  Yes Hoyt Koch, MD  tamsulosin (FLOMAX) 0.4 MG CAPS capsule Take 0.4 mg by mouth daily.    Yes [provider]  VICTOZA 18 MG/3ML SOPN INJECT 0.3ML (=1.8MG )      SUBCUTANEOUSLY DAILY 01/20/17  Yes Elayne Snare, MD  acetaminophen (TYLENOL) 650 MG CR tablet Take 650 mg by mouth every 8 (eight) hours as needed for pain.    [provider]  clobetasol (OLUX) 0.05 % topical foam Apply 1 application topically 2 (two) times daily as needed.  12/27/14   [provider]  collagenase (SANTYL) ointment Apply 1 application topically daily as needed.     [provider]  mupirocin ointment (BACTROBAN) 2 % Apply 1 application topically 2 (two) times daily as needed (affected areas).     [provider]  triamcinolone cream (KENALOG) 0.1 % Apply 1 application topically 2 (two) times daily as needed (affected area).     [provider]   Allergies  Allergen Reactions  . Niacin Nausea Only    REACTION: upset stomach    FAMILY HISTORY:  family history includes Crohn's disease in his son; Diabetes in his brother, brother, mother, and paternal grandmother; Heart attack (age of onset: 11) in his brother; Heart attack (age of onset: 52) in his brother; Hepatitis C in his brother; Hypertension in his mother; Lung cancer in his father; Stroke (age of onset: 73) in his mother; Throat cancer in his paternal uncle. SOCIAL HISTORY:  reports that he has been smoking Cigarettes.  He has a 44.00 pack-year smoking history. He has never used smokeless tobacco. He reports that he drinks alcohol. He reports that he does not use  drugs.  REVIEW OF SYSTEMS:   Coughing w clear phlegm Some pain at chest tube site.  Otherwise negative   SUBJECTIVE:  Hungry, ready to eat  VITAL SIGNS: Temp:  [97.9 F (36.6 C)] 97.9 F (36.6 C) (06/07 0618) Pulse Rate:  [73-106] 95 (06/07 1106) Resp:  [14-28] 24 (06/07 1106) BP: (109-133)/(66-86) 127/85 (06/07 1106) SpO2:  [89 %-96 %] 92 % (06/07 1106) Weight:  [122.5 kg (270 lb)] 122.5 kg (270 lb) (06/07 0618)  PHYSICAL EXAMINATION: General: pleasant overwt man, NAD Neuro: alert and oriented, non-focal HEENT: OP clear, large neck, no oral lesions, pupils equal Cardiovascular: regular, no M, trace pretibial edema Lungs: bilateral R > L insp squeaks and crackles. R pigtail in place, intermittent air leak w a cough on suction Abdomen: benign Musculoskeletal: no deformity Skin: no rash     No results for input(s): NA, K, CL, CO2, BUN, CREATININE, GLUCOSE in the last 168 hours.  Recent Labs Lab 02/25/17 0605  HGB 16.9  HCT 50.3  WBC 8.9  PLT 139*   Ct Biopsy  Result Date: 02/25/2017 INDICATION: History of pulmonary fibrosis, now with indeterminate hypermetabolic right upper lobe pulmonary nodule. Please perform CT-guided biopsy for tissue diagnostic  purposes. EXAM: 1. CT GUIDED RIGHT UPPER LOBE PULMONARY NODULE BIOPSY. 2. CT-GUIDED RIGHT-SIDED CHEST TUBE PLACEMENT. COMPARISON:  PET-CT - 02/09/2017; Chest CT - 01/27/2017 MEDICATIONS: None. ANESTHESIA/SEDATION: Fentanyl 25 mcg IV; Versed 0.5 mg IV Sedation time: 20 minutes; The patient was continuously monitored during the procedure by the interventional radiology nurse under my direct supervision. CONTRAST:  None COMPLICATIONS: SIR LEVEL C - Requires therapy, minor hospitalization (<48 hrs). Procedure complicated by development of a rapidly expanding right-sided pneumothorax requiring urgent chest tube placement. Fortunately, the patient remained hemodynamically stable prior to successful chest tube placement. PROCEDURE:  Informed consent was obtained from the patient following an explanation of the procedure, risks, benefits and alternatives. The patient understands,agrees and consents for the procedure. All questions were addressed. A time out was performed prior to the initiation of the procedure. The patient was positioned supine, slightly LPO on the CT table and a limited chest CT was performed for procedural planning demonstrating unchanged size and positioning of slightly spiculated hypermetabolic nodule within the subpleural aspect of the right upper lobe with dominant component measuring approximately 3.1 x 1.8 cm (image 20, series 2). The operative site was prepped and draped in the usual sterile fashion. Under sterile conditions and local anesthesia, a 17 gauge coaxial needle was advanced into the peripheral aspect of the nodule. Positioning was confirmed with intermittent CT fluoroscopy and followed by the acquisition of 2 core needle biopsies with an 18 gauge core needle biopsy device. The coaxial needle was removed and superficial hemostasis was achieved with manual compression. Immediate postprocedural chest CT demonstrated development of a moderate-sized pneumothorax which was found to enlarged with subsequent minimally delayed imaging. As such, decision was made to place a right-sided chest tube. The skin was anesthetized with 1% lidocaine. An 18 gauge trocar needle was advanced into the pleural space and air was aspirated. A short Amplatz wire was coiled within the pleural space. The track was dilated allowing placement of a 10 French percutaneous drainage catheter. The drainage catheter was connected to a three-way stopcock and approximately 300 cc of air was aspirated. Postprocedural imaging was obtained. The chest tube was connected to a pleural vac device and secured at the skin entrance site within interrupted suture. A Vaseline gauze dressing and StatLock device were placed. The patient otherwise tolerated the  procedure well and remained hemodynamically stable during the chest tube placement. IMPRESSION: 1. Technically successful CT guided core needle core biopsy of indeterminate hypermetabolic right upper lobe pulmonary nodule/mass. 2. Procedure complicated by development of a rapidly expanding right-sided pneumothorax requiring urgent chest tube placement. Fortunately, the patient remained hemodynamically stable during the chest tube placement, with resolution of the shortness of breath following placement of the chest tube to the pleural vac device and wall suction. Electronically Signed   By: Sandi Mariscal M.D.   On: 02/25/2017 11:24   Ct Perc Pleural Drain W/indwell Cath W/img Guide  Result Date: 02/25/2017 INDICATION: History of pulmonary fibrosis, now with indeterminate hypermetabolic right upper lobe pulmonary nodule. Please perform CT-guided biopsy for tissue diagnostic purposes. EXAM: 1. CT GUIDED RIGHT UPPER LOBE PULMONARY NODULE BIOPSY. 2. CT-GUIDED RIGHT-SIDED CHEST TUBE PLACEMENT. COMPARISON:  PET-CT - 02/09/2017; Chest CT - 01/27/2017 MEDICATIONS: None. ANESTHESIA/SEDATION: Fentanyl 25 mcg IV; Versed 0.5 mg IV Sedation time: 20 minutes; The patient was continuously monitored during the procedure by the interventional radiology nurse under my direct supervision. CONTRAST:  None COMPLICATIONS: SIR LEVEL C - Requires therapy, minor hospitalization (<48 hrs). Procedure complicated by development of  a rapidly expanding right-sided pneumothorax requiring urgent chest tube placement. Fortunately, the patient remained hemodynamically stable prior to successful chest tube placement. PROCEDURE: Informed consent was obtained from the patient following an explanation of the procedure, risks, benefits and alternatives. The patient understands,agrees and consents for the procedure. All questions were addressed. A time out was performed prior to the initiation of the procedure. The patient was positioned supine, slightly  LPO on the CT table and a limited chest CT was performed for procedural planning demonstrating unchanged size and positioning of slightly spiculated hypermetabolic nodule within the subpleural aspect of the right upper lobe with dominant component measuring approximately 3.1 x 1.8 cm (image 20, series 2). The operative site was prepped and draped in the usual sterile fashion. Under sterile conditions and local anesthesia, a 17 gauge coaxial needle was advanced into the peripheral aspect of the nodule. Positioning was confirmed with intermittent CT fluoroscopy and followed by the acquisition of 2 core needle biopsies with an 18 gauge core needle biopsy device. The coaxial needle was removed and superficial hemostasis was achieved with manual compression. Immediate postprocedural chest CT demonstrated development of a moderate-sized pneumothorax which was found to enlarged with subsequent minimally delayed imaging. As such, decision was made to place a right-sided chest tube. The skin was anesthetized with 1% lidocaine. An 18 gauge trocar needle was advanced into the pleural space and air was aspirated. A short Amplatz wire was coiled within the pleural space. The track was dilated allowing placement of a 10 French percutaneous drainage catheter. The drainage catheter was connected to a three-way stopcock and approximately 300 cc of air was aspirated. Postprocedural imaging was obtained. The chest tube was connected to a pleural vac device and secured at the skin entrance site within interrupted suture. A Vaseline gauze dressing and StatLock device were placed. The patient otherwise tolerated the procedure well and remained hemodynamically stable during the chest tube placement. IMPRESSION: 1. Technically successful CT guided core needle core biopsy of indeterminate hypermetabolic right upper lobe pulmonary nodule/mass. 2. Procedure complicated by development of a rapidly expanding right-sided pneumothorax requiring  urgent chest tube placement. Fortunately, the patient remained hemodynamically stable during the chest tube placement, with resolution of the shortness of breath following placement of the chest tube to the pleural vac device and wall suction. Electronically Signed   By: Sandi Mariscal M.D.   On: 02/25/2017 11:24    STUDIES:  CT biopsy 6/7 > successful bx of right lung mass but procedure c/b rapidly expanding right sided PTX.  SIGNIFICANT EVENTS  6/7 > admit.  ASSESSMENT / PLAN:  Right sided pneumothorax - as unfortunate complication following biopsy of right lung mass.  Now s/p chest tube placement. Right lung hypermetabolic mass - s/p biopsy 6/7 by IR. Plan: Continue chest tube to suction tonight If no air leak by AM and if CXR reassuring, transition to water seal. Incentive spirometry. Follow CXR. Follow up on pathology.  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  Also exacerbated by right pneumothorax.  Unfortunately despite his chronic hypoxia, he is non-compliant with O2 at home. Tobacco cessation counseling. Plan: Continue supplemental O2 as needed to maintain SpO2 > 90%. Need to work on his home compliance Budesonide / Brovana in lieu of preadmission Advair. Atrovent nebs scheduled. Continue nocturnal CPAP. Tobacco cessation counseling.  Hx A.flutter, HTN, HLD, dCHF. Plan: Continue heparin gtt in lieu of preadmission  xarelto and pletal. Continue preadmission diltiazem, lasix, losartan, metolazone, metoprolol, rosuvastatin. Goal even balance.  DM II. Plan: Continue preadmission Levemir, metformin, victoza.  Rest per primary team.  Baltazar Apo, MD, PhD 02/25/2017, 2:33 PM [facility] [phone] or if no answer [phone]

## 2017-02-25 NOTE — Sedation Documentation (Signed)
R upper lobe CT in place to 20cm of suction

## 2017-02-25 NOTE — Discharge Instructions (Addendum)
Needle Biopsy of the Lung, Care After °This sheet gives you information about how to care for yourself after your procedure. Your health care provider may also give you more specific instructions. If you have problems or questions, contact your health care provider. °What can I expect after the procedure? °After the procedure, it is common to have: °· Soreness, pain, and tenderness where a tissue sample was taken (biopsy site). °· A cough. °· A sore throat. ° °Follow these instructions at home: °Biopsy site care °· Follow instructions from your health care provider about when to remove the bandage that was placed on the biopsy site. °· Keep the bandage dry until it has been removed. °· Check your biopsy site every day for signs of infection. Check for: °? More redness, swelling, or pain. °? More fluid or blood. °? Warmth to the touch. °? Pus or a bad smell. °General instructions °· Rest as directed by your health care provider. Ask your health care provider what activities are safe for you. °· Do not take baths, swim, or use a hot tub until your health care provider approves. °· Take over-the-counter and prescription medicines only as told by your health care provider. °· If you have airplane travel scheduled, talk with your health care provider about when it is safe for you to travel by airplane. °· It is up to you to get the results of your procedure. Ask your health care provider, or the department that is doing the procedure, when your results will be ready. °· Keep all follow-up visits as told by your health care provider. This is important. °Contact a health care provider if: °· You have more redness, swelling, or pain around your biopsy site. °· You have more fluid or blood coming from your biopsy site. °· Your biopsy site feels warm to the touch. °· You have pus or a bad smell coming from your biopsy site. °· You have a fever. °· You have pain that does not get better with medicine. °Get help right away  if: °· You have problems breathing. °· You have chest pain. °· You cough up blood. °· You faint. °· You have a fast heart rate. °Summary °· After a needle biopsy of the lung, it is common to have a cough, a sore throat, or soreness, pain, and tenderness where a tissue sample was taken (biopsy site). °· You should check your biopsy area every day for signs of infection, including pus or a bad smell, warmth, more fluid or blood, or more redness, swelling, or pain. °· You should not take baths, swim, or use a hot tub until your health care provider approves. °· It is up to you to get the results of your procedure. Ask your health care provider, or the department that is doing the procedure, when your results will be ready. °This information is not intended to replace advice given to you by your health care provider. Make sure you discuss any questions you have with your health care provider. °Document Released: 07/05/2007 Document Revised: 07/29/2016 Document Reviewed: 07/29/2016 °Elsevier Interactive Patient Education © 2017 [employer] ° °Moderate Conscious Sedation, Adult, Care After °These instructions provide you with information about caring for yourself after your procedure. Your health care provider may also give you more specific instructions. Your treatment has been planned according to current medical practices, but problems sometimes occur. Call your health care provider if you have any problems or questions after your procedure. °What can I expect after the   procedure? °After your procedure, it is common: °· To feel sleepy for several hours. °· To feel clumsy and have poor balance for several hours. °· To have poor judgment for several hours. °· To vomit if you eat too soon. ° °Follow these instructions at home: °For at least 24 hours after the procedure: ° °· Do not: °? Participate in activities where you could fall or become injured. °? Drive. °? Use heavy machinery. °? Drink alcohol. °? Take sleeping  pills or medicines that cause drowsiness. °? Make important decisions or sign legal documents. °? Take care of children on your own. °· Rest. °Eating and drinking °· Follow the diet recommended by your health care provider. °· If you vomit: °? Drink water, juice, or soup when you can drink without vomiting. °? Make sure you have little or no nausea before eating solid foods. °General instructions °· Have a responsible adult stay with you until you are awake and alert. °· Take over-the-counter and prescription medicines only as told by your health care provider. °· If you smoke, do not smoke without supervision. °· Keep all follow-up visits as told by your health care provider. This is important. °Contact a health care provider if: °· You keep feeling nauseous or you keep vomiting. °· You feel light-headed. °· You develop a rash. °· You have a fever. °Get help right away if: °· You have trouble breathing. °This information is not intended to replace advice given to you by your health care provider. Make sure you discuss any questions you have with your health care provider. °Document Released: 06/28/2013 Document Revised: 02/10/2016 Document Reviewed: 12/28/2015 °Elsevier Interactive Patient Education © 2018 [employer] ° °

## 2017-02-25 NOTE — Progress Notes (Signed)
Patient will be admitted for PTX following high risk lung biopsy.  We will contact CCM/pulomary medicine to follow the patient along with [name] for assistance in management.  Neil Carroll E 10:03 AM 02/25/2017

## 2017-02-25 NOTE — Sedation Documentation (Addendum)
Post procedure pneumothorax  indicated. 14 Fr. Chest tube placed to wall suction. Pt. mainitianing stable VS

## 2017-02-25 NOTE — Sedation Documentation (Signed)
PCXR done

## 2017-02-25 NOTE — Progress Notes (Signed)
ANTICOAGULATION CONSULT NOTE - Follow Up Consult  Pharmacy Consult for Heparin  Indication: atrial fibrillation  Allergies  Allergen Reactions  . Niacin Nausea Only    REACTION: upset stomach   Patient Measurements: Height: 5\' 11"  (180.3 cm) Weight: 264 lb (119.7 kg) IBW/kg (Calculated) : 75.3  Vital Signs: Temp: 98.1 F (36.7 C) (06/07 1954) Temp Source: Oral (06/07 1954) BP: 110/64 (06/07 1954) Pulse Rate: 105 (06/07 1954)  Labs:  Recent Labs  02/25/17 0605 02/25/17 2205  HGB 16.9  --   HCT 50.3  --   PLT 139*  --   APTT 33  --   LABPROT 13.3  --   INR 1.01  --   HEPARINUNFRC  --  0.33    CrCl cannot be calculated (Patient's most recent lab result is older than the maximum 21 days allowed.).   Assessment: 66 y/o M s/p biopsy of right lung lesion, now on heparin for afib  Goal of Therapy:  Heparin level 0.3-0.7 units/ml Monitor platelets by anticoagulation protocol: Yes   Plan:  -Cont heparin at 1400 units/hr -HL with AM labs  Narda Bonds 02/25/2017,11:25 PM

## 2017-02-25 NOTE — H&P (Signed)
Chief Complaint: right lung mass  Referring Physician:[name]  Supervising Physician: Sandi Mariscal  Patient Status: [facility] - Out-pt  HPI: Neil Carroll. is a 66 y.o. male who has a history of pulmonary fibrosis and interstitial lung disease, a fib, HTN, and tobacco abuse who presents today for a biopsy of a right lung lesion.  He is followed by [name] for his lung disease.  He is on advair and is supposed to be on 4L of O2 all the time, but chooses to not wear this.  On recent imaging he was found to have a mass in his right lung.  He has been referred to IR for a biopsy.  He takes Xarelto as well as Pletal.  These have been held.  He has no complaints today.  Past Medical History:  Past Medical History:  Diagnosis Date  . Adenomatous colon polyp   . Aortic insufficiency    Echo 3/18: Severe concentric LVH, EF 60-65, normal wall motion, moderate AI, mild LAE, mild TR  . Arthritis    left hip replacement  . Atrial flutter ([place])    onset  2011. s/p EPS/RFA 12/2011  . Cataract   . Chronic bronchitis   . Contact dermatitis and other eczema due to plants (except food)   . COPD (chronic obstructive pulmonary disease) ([place])   . GERD (gastroesophageal reflux disease)   . Hyperlipidemia   . Hypertension   . LV dysfunction    EF 45-50% 12/2011  . OSA (obstructive sleep apnea) 10/13/2016   Mild with AHI 9.7/hr with significant oxygen desaturations as low as 76% now on CPAP  . Other peripheral vascular disease(443.89)    bilateral lower extremity  . Tobacco use disorder    dependent  . Transient global amnesia   . Tuberculosis   . Type II diabetes mellitus ([place])     Past Surgical History:  Past Surgical History:  Procedure Laterality Date  . A FLUTTER ABLATION N/A 12/28/2011   Procedure: ABLATION A FLUTTER;  Surgeon: Evans Lance, MD;  Location: [facility] CATH LAB;  Service: Cardiovascular;  Laterality: N/A;  . CARDIAC CATHETERIZATION N/A 07/10/2015   Procedure: Left  Heart Cath and Coronary Angiography;  Surgeon: Sherren Mocha, MD;  Location: [facility] CV LAB;  Service: Cardiovascular;  Laterality: N/A;  . CARDIAC ELECTROPHYSIOLOGY MAPPING AND ABLATION  03/2010  . CARDIOVERSION N/A 07/13/2016   Procedure: CARDIOVERSION;  Surgeon: Skeet Latch, MD;  Location: [facility];  Service: Cardiovascular;  Laterality: N/A;  . COLONOSCOPY    . colonoscopy with polypectomy  2006 & 2011   Dr [name]  . CYSTOSCOPY  11/2007   Dr Jeffie Pollock  . PARTIAL HIP ARTHROPLASTY Right 01/2000   "Right; replaced ball & stem"  . PARTIAL HIP ARTHROPLASTY Left   . POLYPECTOMY    . TEE WITHOUT CARDIOVERSION  12/28/2011   Procedure: TRANSESOPHAGEAL ECHOCARDIOGRAM (TEE);  Surgeon: Larey Dresser, MD;  Location: [facility] ENDOSCOPY;  Service: Cardiovascular;  Laterality: N/A;    Family History:  Family History  Problem Relation Age of Onset  . Stroke Mother 55  . Hypertension Mother   . Diabetes Mother   . Diabetes Paternal Grandmother   . Lung cancer Father        smoker  . Diabetes Brother   . Heart attack Brother 65  . Diabetes Brother   . Hepatitis C Brother   . Throat cancer Paternal Uncle        1/2 uncle  .  Heart attack Brother 2  . Crohn's disease Son   . Colon cancer Neg Hx   . Esophageal cancer Neg Hx   . Rectal cancer Neg Hx   . Stomach cancer Neg Hx     Social History:  reports that he has been smoking Cigarettes.  He has a 44.00 pack-year smoking history. He has never used smokeless tobacco. He reports that he drinks alcohol. He reports that he does not use drugs.  Allergies:  Allergies  Allergen Reactions  . Niacin Nausea Only    REACTION: upset stomach    Medications: Medications reviewed in epic  Please HPI for pertinent positives, otherwise complete 10 system ROS negative.  Mallampati Score: MD Evaluation Airway: WNL Heart: Other (comments) Heart  comments: a fib Abdomen: WNL Chest/ Lungs: Other (comments) Chest/ lungs comments: diffuse  wheezing and some rhonchi ASA  Classification: 3 Mallampati/Airway Score: Two  Physical Exam: BP 109/66   Pulse 85   Temp 97.9 F (36.6 C) (Oral)   Resp 20   Ht 5\' 11"  (1.803 m)   Wt 270 lb (122.5 kg)   SpO2 91%   BMI 37.66 kg/m  Body mass index is 37.66 kg/m. General: pleasant, obese black male who is laying in bed in NAD HEENT: head is normocephalic, atraumatic.  Sclera are noninjected.  PERRL.  Ears and nose without any masses or lesions.  Mouth is pink and moist Heart: irregular.  Normal s1,s2. No obvious murmurs, gallops, or rubs noted.  Palpable radial pulses bilaterally Lungs: diffuse wheezing and rhonchi.  Respiratory effort nonlabored Abd: soft, NT, ND, +BS, no masses, hernias, or organomegaly Psych: A&Ox3 with an appropriate affect.   Labs: Results for orders placed or performed during the hospital encounter of 02/25/17 (from the past 48 hour(s))  APTT upon arrival     Status: None   Collection Time: 02/25/17  6:05 AM  Result Value Ref Range   aPTT 33 24 - 36 seconds  CBC upon arrival     Status: Abnormal   Collection Time: 02/25/17  6:05 AM  Result Value Ref Range   WBC 8.9 4.0 - 10.5 K/uL   RBC 5.43 4.22 - 5.81 MIL/uL   Hemoglobin 16.9 13.0 - 17.0 g/dL   HCT 50.3 39.0 - 52.0 %   MCV 92.6 78.0 - 100.0 fL   MCH 31.1 26.0 - 34.0 pg   MCHC 33.6 30.0 - 36.0 g/dL   RDW 13.5 11.5 - 15.5 %   Platelets 139 (L) 150 - 400 K/uL  Protime-INR upon arrival     Status: None   Collection Time: 02/25/17  6:05 AM  Result Value Ref Range   Prothrombin Time 13.3 11.4 - 15.2 seconds   INR 1.01     Imaging: No results found.  Assessment/Plan 1. Right lung mass  We will plan to proceed with a biopsy of his lung mass today.  His labs and vitals have been reviewed.  He has no questions going forward. Risks and Benefits discussed with the patient including, but not limited to bleeding, hemoptysis, respiratory failure requiring intubation, infection, pneumothorax requiring  chest tube placement, stroke from air embolism or even death. All of the patient's questions were answered, patient is agreeable to proceed. Consent signed and in chart.   Thank you for this interesting consult.  I greatly enjoyed meeting Neil MANFREDO Sr. and look forward to participating in their care.  A copy of this report was sent to the requesting provider on this date.  Electronically Signed: Henreitta Cea 02/25/2017, 7:53 AM   I spent a total of  30 Minutes   in face to face in clinical consultation, greater than 50% of which was counseling/coordinating care for right lung mass

## 2017-02-26 ENCOUNTER — Telehealth: Payer: Self-pay | Admitting: Physician Assistant

## 2017-02-26 ENCOUNTER — Observation Stay (HOSPITAL_COMMUNITY): Payer: Medicare Other

## 2017-02-26 DIAGNOSIS — J449 Chronic obstructive pulmonary disease, unspecified: Secondary | ICD-10-CM | POA: Diagnosis present

## 2017-02-26 DIAGNOSIS — G4733 Obstructive sleep apnea (adult) (pediatric): Secondary | ICD-10-CM | POA: Diagnosis present

## 2017-02-26 DIAGNOSIS — Z4582 Encounter for adjustment or removal of myringotomy device (stent) (tube): Secondary | ICD-10-CM | POA: Diagnosis not present

## 2017-02-26 DIAGNOSIS — Z801 Family history of malignant neoplasm of trachea, bronchus and lung: Secondary | ICD-10-CM | POA: Diagnosis not present

## 2017-02-26 DIAGNOSIS — Z96643 Presence of artificial hip joint, bilateral: Secondary | ICD-10-CM | POA: Diagnosis present

## 2017-02-26 DIAGNOSIS — Z9889 Other specified postprocedural states: Secondary | ICD-10-CM

## 2017-02-26 DIAGNOSIS — Z794 Long term (current) use of insulin: Secondary | ICD-10-CM | POA: Diagnosis not present

## 2017-02-26 DIAGNOSIS — K219 Gastro-esophageal reflux disease without esophagitis: Secondary | ICD-10-CM | POA: Diagnosis present

## 2017-02-26 DIAGNOSIS — F17210 Nicotine dependence, cigarettes, uncomplicated: Secondary | ICD-10-CM | POA: Diagnosis present

## 2017-02-26 DIAGNOSIS — Z6837 Body mass index (BMI) 37.0-37.9, adult: Secondary | ICD-10-CM | POA: Diagnosis not present

## 2017-02-26 DIAGNOSIS — Z4682 Encounter for fitting and adjustment of non-vascular catheter: Secondary | ICD-10-CM | POA: Diagnosis not present

## 2017-02-26 DIAGNOSIS — E1151 Type 2 diabetes mellitus with diabetic peripheral angiopathy without gangrene: Secondary | ICD-10-CM | POA: Diagnosis present

## 2017-02-26 DIAGNOSIS — Z91048 Other nonmedicinal substance allergy status: Secondary | ICD-10-CM | POA: Diagnosis not present

## 2017-02-26 DIAGNOSIS — Z9981 Dependence on supplemental oxygen: Secondary | ICD-10-CM | POA: Diagnosis not present

## 2017-02-26 DIAGNOSIS — Z8249 Family history of ischemic heart disease and other diseases of the circulatory system: Secondary | ICD-10-CM | POA: Diagnosis not present

## 2017-02-26 DIAGNOSIS — J939 Pneumothorax, unspecified: Secondary | ICD-10-CM | POA: Diagnosis not present

## 2017-02-26 DIAGNOSIS — J9621 Acute and chronic respiratory failure with hypoxia: Secondary | ICD-10-CM | POA: Diagnosis present

## 2017-02-26 DIAGNOSIS — Z7951 Long term (current) use of inhaled steroids: Secondary | ICD-10-CM | POA: Diagnosis not present

## 2017-02-26 DIAGNOSIS — J95811 Postprocedural pneumothorax: Secondary | ICD-10-CM | POA: Diagnosis not present

## 2017-02-26 DIAGNOSIS — R918 Other nonspecific abnormal finding of lung field: Secondary | ICD-10-CM | POA: Diagnosis not present

## 2017-02-26 DIAGNOSIS — I4891 Unspecified atrial fibrillation: Secondary | ICD-10-CM | POA: Diagnosis present

## 2017-02-26 DIAGNOSIS — Y848 Other medical procedures as the cause of abnormal reaction of the patient, or of later complication, without mention of misadventure at the time of the procedure: Secondary | ICD-10-CM | POA: Diagnosis present

## 2017-02-26 DIAGNOSIS — Z9119 Patient's noncompliance with other medical treatment and regimen: Secondary | ICD-10-CM | POA: Diagnosis not present

## 2017-02-26 DIAGNOSIS — J8410 Pulmonary fibrosis, unspecified: Secondary | ICD-10-CM | POA: Diagnosis present

## 2017-02-26 DIAGNOSIS — I517 Cardiomegaly: Secondary | ICD-10-CM | POA: Diagnosis not present

## 2017-02-26 DIAGNOSIS — Y92238 Other place in hospital as the place of occurrence of the external cause: Secondary | ICD-10-CM | POA: Diagnosis present

## 2017-02-26 DIAGNOSIS — E669 Obesity, unspecified: Secondary | ICD-10-CM | POA: Diagnosis present

## 2017-02-26 DIAGNOSIS — I10 Essential (primary) hypertension: Secondary | ICD-10-CM | POA: Diagnosis present

## 2017-02-26 DIAGNOSIS — Z808 Family history of malignant neoplasm of other organs or systems: Secondary | ICD-10-CM | POA: Diagnosis not present

## 2017-02-26 DIAGNOSIS — E785 Hyperlipidemia, unspecified: Secondary | ICD-10-CM | POA: Diagnosis present

## 2017-02-26 DIAGNOSIS — Z7901 Long term (current) use of anticoagulants: Secondary | ICD-10-CM | POA: Diagnosis not present

## 2017-02-26 LAB — CBC
HCT: 49.6 % (ref 39.0–52.0)
HEMOGLOBIN: 16.2 g/dL (ref 13.0–17.0)
MCH: 30.2 pg (ref 26.0–34.0)
MCHC: 32.7 g/dL (ref 30.0–36.0)
MCV: 92.5 fL (ref 78.0–100.0)
PLATELETS: 125 10*3/uL — AB (ref 150–400)
RBC: 5.36 MIL/uL (ref 4.22–5.81)
RDW: 13.6 % (ref 11.5–15.5)
WBC: 8.4 10*3/uL (ref 4.0–10.5)

## 2017-02-26 LAB — BASIC METABOLIC PANEL
ANION GAP: 12 (ref 5–15)
BUN: 15 mg/dL (ref 6–20)
CALCIUM: 8.5 mg/dL — AB (ref 8.9–10.3)
CHLORIDE: 101 mmol/L (ref 101–111)
CO2: 25 mmol/L (ref 22–32)
CREATININE: 1.29 mg/dL — AB (ref 0.61–1.24)
GFR calc non Af Amer: 56 mL/min — ABNORMAL LOW (ref >60)
Glucose, Bld: 175 mg/dL — ABNORMAL HIGH (ref 65–99)
Potassium: 3.3 mmol/L — ABNORMAL LOW (ref 3.5–5.1)
SODIUM: 138 mmol/L (ref 135–145)

## 2017-02-26 LAB — GLUCOSE, CAPILLARY
GLUCOSE-CAPILLARY: 123 mg/dL — AB (ref 65–99)
Glucose-Capillary: 150 mg/dL — ABNORMAL HIGH (ref 65–99)

## 2017-02-26 LAB — HEPARIN LEVEL (UNFRACTIONATED): HEPARIN UNFRACTIONATED: 0.26 [IU]/mL — AB (ref 0.30–0.70)

## 2017-02-26 MED ORDER — METOLAZONE 2.5 MG PO TABS: 2.5000 mg | ORAL_TABLET | ORAL | 3 refills | Status: DC

## 2017-02-26 NOTE — Telephone Encounter (Signed)
New Message   Ref#  [id number]   metolazone (ZAROXOLYN) tablet 2.5 mg  This medication is on back order , they want to know if they can fill with 5mg  (but it can not be split in half)

## 2017-02-26 NOTE — Progress Notes (Signed)
ANTICOAGULATION CONSULT NOTE - Follow Up Consult  Pharmacy Consult for Heparin Indication: atrial fibrillation  Allergies  Allergen Reactions  . Niacin Nausea Only    REACTION: upset stomach    Patient Measurements: Height: 5\' 11"  (180.3 cm) Weight: 264 lb (119.7 kg) IBW/kg (Calculated) : 75.3 Heparin Dosing Weight:    Vital Signs: Temp: 98.2 F (36.8 C) (06/08 0433) Temp Source: Oral (06/08 0433) BP: 90/69 (06/08 0900) Pulse Rate: 68 (06/08 0900)  Labs:  Recent Labs  02/25/17 0605 02/25/17 2205 02/26/17 0318  HGB 16.9  --  16.2  HCT 50.3  --  49.6  PLT 139*  --  125*  APTT 33  --   --   LABPROT 13.3  --   --   INR 1.01  --   --   HEPARINUNFRC  --  0.33 0.26*  CREATININE  --   --  1.29*    Estimated Creatinine Clearance: 74.2 mL/min (A) (by C-G formula based on SCr of 1.29 mg/dL (H)).   Assessment:  Anticoag: Xarelto PTA for afib, now on hep gtt. HL 0.26. CBC stable. Watch plts 125  Goal of Therapy:  Heparin level 0.3-0.7 units/ml Monitor platelets by anticoagulation protocol: Yes   Plan:  Increase heparin at 1550 units/hr Daily HL and CBC F/U CT removal and restart Xarelto   Neil Carroll S. Alford Highland, PharmD, BCPS Clinical Staff Pharmacist Pager [phone]  Neil Carroll 02/26/2017,11:01 AM

## 2017-02-26 NOTE — Telephone Encounter (Signed)
Note    Called [facility] and spoke with a Education administrator and the Pharmacist. Pharmacist stated the Metolazone was on back order but the patient could cut the tablet in half if he had a pill cutter since the tablet is not scored. Pharmacist did not know when the 2.5 mg tablets would come in, she just stated they were on back order. She offered to fill rx with the 5 mg tablets of Metolazone if the patient had a pill cutter. I called Pt's other pharmacy, [facility] on [address] in [place] and spoke with the pharmacist and I asked him if [facility] carried the 2.5 mg tablets of Metolazone, and he stated yes they did. I told pharmacist I would send the Pt's prescription of Metolazone 2.5 mg to [facility] I called the patient (who is currently in hospital), patient answered and I told him I sent his rx to the [place] of the 2. 5 tablets of Metolazone and he thanked me and stated he would pick up the rx when he got out of the hospital. Will route to Richardson Dopp, PA's covering [place].           02/26/17 11:21 AM  Lorenda Hatchet routed this conversation to [facility] Triage  Lorenda Hatchet      02/26/17 11:18 AM  Note    Hinton Dyer with [facility] [phone] ref [id number] calling to say the rx for Metolazone 2.5 mg is on back order and but they have a 5mg  tab that's not scored-pls advise

## 2017-02-26 NOTE — Progress Notes (Signed)
Referring Physician(s): Rigoberto Noel  Supervising Physician: Arne Cleveland  Patient Status: [facility] - In-pt  Chief Complaint: PTX after lung biopsy  Subjective: Patient is feeling well today.  No complaints of SOB.  Sitting up on the side of the bed.  Allergies: Niacin  Medications: Prior to Admission medications   Medication Sig Start Date End Date Taking? Authorizing Provider  cilostazol (PLETAL) 100 MG tablet take 1 tablet by mouth twice a day 11/19/16  Yes Hoyt Koch, MD  diltiazem (CARTIA XT) 180 MG 24 hr capsule Take 1 capsule (180 mg total) by mouth 2 (two) times daily. 01/08/17  Yes Sherren Mocha, MD  etanercept (ENBREL SURECLICK) 50 MG/ML injection Inject 50 mg into the skin 2 (two) times a week. Inject on Sundays & Wednesdays   Yes [provider]  Flaxseed, Linseed, (FLAX SEED OIL) 1000 MG CAPS Take 1 capsule by mouth 2 (two) times daily.    Yes [provider]  Fluticasone-Salmeterol (ADVAIR) 100-50 MCG/DOSE AEPB Inhale 1 puff into the lungs 2 (two) times daily. 10/22/16  Yes Hoyt Koch, MD  furosemide (LASIX) 40 MG tablet Take 2 tablets (80 mg total) by mouth 2 (two) times daily. 04/22/16  Yes Imogene Burn, PA-C  insulin detemir (LEVEMIR) 100 UNIT/ML injection Inject 26 Units into the skin at bedtime.   Yes [provider]  losartan (COZAAR) 25 MG tablet Take 1 tablet (25 mg total) by mouth daily. 01/08/17 04/08/17 Yes Sherren Mocha, MD  metFORMIN (GLUCOPHAGE) 1000 MG tablet Take 1,000 mg by mouth 2 (two) times daily with a meal.   Yes [provider]  metolazone (ZAROXOLYN) 2.5 MG tablet Take 1 tablet (2.5 mg total) by mouth as directed. TAKE 1 TABLET TWICE A WEEK 02/24/17 05/25/17 Yes Sherren Mocha, MD  metoprolol (LOPRESSOR) 100 MG tablet Take 100-150 mg by mouth See admin instructions. 1 tablet every morning And take 1 and 1/2 tablets by mouth every evening   Yes [provider]  Multiple  Vitamins-Minerals (CENTRUM ADULTS) TABS Take 1 tablet by mouth daily.   Yes [provider]  Omega-3 Fatty Acids (FISH OIL) 1000 MG CAPS Take 2 capsules by mouth 2 (two) times daily.    Yes [provider]  potassium chloride SA (K-DUR,KLOR-CON) 20 MEQ tablet Take 1 tablet (20 mEq total) by mouth 2 (two) times daily. 04/22/16  Yes Imogene Burn, PA-C  rivaroxaban (XARELTO) 20 MG TABS tablet Take 20 mg by mouth every morning.    Yes [provider]  rosuvastatin (CRESTOR) 10 MG tablet Take 1 tablet (10 mg total) by mouth daily. 09/17/16  Yes Hoyt Koch, MD  tamsulosin (FLOMAX) 0.4 MG CAPS capsule Take 0.4 mg by mouth daily.    Yes [provider]  VICTOZA 18 MG/3ML SOPN INJECT 0.3ML (=1.8MG )      SUBCUTANEOUSLY DAILY 01/20/17  Yes Elayne Snare, MD  acetaminophen (TYLENOL) 650 MG CR tablet Take 650 mg by mouth every 8 (eight) hours as needed for pain.    [provider]  clobetasol (OLUX) 0.05 % topical foam Apply 1 application topically 2 (two) times daily as needed.  12/27/14   [provider]  collagenase (SANTYL) ointment Apply 1 application topically daily as needed.     [provider]  mupirocin ointment (BACTROBAN) 2 % Apply 1 application topically 2 (two) times daily as needed (affected areas).     [provider]  triamcinolone cream (KENALOG) 0.1 % Apply 1 application  topically 2 (two) times daily as needed (affected area).     [provider]    Vital Signs: BP 90/69 (BP Location: Right Arm)   Pulse 68   Temp 98.2 F (36.8 C) (Oral)   Resp 19   Ht 5\' 11"  (1.803 m)   Wt 264 lb (119.7 kg)   SpO2 96%   BMI 36.82 kg/m   Physical Exam: Chest: CT in place on right lateral side.  No air leak.  Only about 80cc of serousang output in the cannister.  Still on suction.  Good air movement bilaterally, but still with some wheezing, but improved since yesterday.  Imaging: Ct Biopsy  Result Date:  02/25/2017 INDICATION: History of pulmonary fibrosis, now with indeterminate hypermetabolic right upper lobe pulmonary nodule. Please perform CT-guided biopsy for tissue diagnostic purposes. EXAM: 1. CT GUIDED RIGHT UPPER LOBE PULMONARY NODULE BIOPSY. 2. CT-GUIDED RIGHT-SIDED CHEST TUBE PLACEMENT. COMPARISON:  PET-CT - 02/09/2017; Chest CT - 01/27/2017 MEDICATIONS: None. ANESTHESIA/SEDATION: Fentanyl 25 mcg IV; Versed 0.5 mg IV Sedation time: 20 minutes; The patient was continuously monitored during the procedure by the interventional radiology nurse under my direct supervision. CONTRAST:  None COMPLICATIONS: SIR LEVEL C - Requires therapy, minor hospitalization (<48 hrs). Procedure complicated by development of a rapidly expanding right-sided pneumothorax requiring urgent chest tube placement. Fortunately, the patient remained hemodynamically stable prior to successful chest tube placement. PROCEDURE: Informed consent was obtained from the patient following an explanation of the procedure, risks, benefits and alternatives. The patient understands,agrees and consents for the procedure. All questions were addressed. A time out was performed prior to the initiation of the procedure. The patient was positioned supine, slightly LPO on the CT table and a limited chest CT was performed for procedural planning demonstrating unchanged size and positioning of slightly spiculated hypermetabolic nodule within the subpleural aspect of the right upper lobe with dominant component measuring approximately 3.1 x 1.8 cm (image 20, series 2). The operative site was prepped and draped in the usual sterile fashion. Under sterile conditions and local anesthesia, a 17 gauge coaxial needle was advanced into the peripheral aspect of the nodule. Positioning was confirmed with intermittent CT fluoroscopy and followed by the acquisition of 2 core needle biopsies with an 18 gauge core needle biopsy device. The coaxial needle was removed and  superficial hemostasis was achieved with manual compression. Immediate postprocedural chest CT demonstrated development of a moderate-sized pneumothorax which was found to enlarged with subsequent minimally delayed imaging. As such, decision was made to place a right-sided chest tube. The skin was anesthetized with 1% lidocaine. An 18 gauge trocar needle was advanced into the pleural space and air was aspirated. A short Amplatz wire was coiled within the pleural space. The track was dilated allowing placement of a 10 French percutaneous drainage catheter. The drainage catheter was connected to a three-way stopcock and approximately 300 cc of air was aspirated. Postprocedural imaging was obtained. The chest tube was connected to a pleural vac device and secured at the skin entrance site within interrupted suture. A Vaseline gauze dressing and StatLock device were placed. The patient otherwise tolerated the procedure well and remained hemodynamically stable during the chest tube placement. IMPRESSION: 1. Technically successful CT guided core needle core biopsy of indeterminate hypermetabolic right upper lobe pulmonary nodule/mass. 2. Procedure complicated by development of a rapidly expanding right-sided pneumothorax requiring urgent chest tube placement. Fortunately, the patient remained hemodynamically stable during the chest tube placement, with resolution of the shortness of breath following  placement of the chest tube to the pleural vac device and wall suction. Electronically Signed   By: Sandi Mariscal M.D.   On: 02/25/2017 11:24   Portable Chest 1 View  Result Date: 02/26/2017 CLINICAL DATA:  Follow-up pneumothorax EXAM: PORTABLE CHEST 1 VIEW COMPARISON:  02/25/2017 FINDINGS: Cardiac shadow is stable. Right-sided chest tube is noted. No pneumothorax is seen. Stable right upper lobe mass lesion is again noted. Diffuse fibrotic changes are noted throughout both lungs. No new focal abnormality is noted.  IMPRESSION: No recurrent pneumothorax is noted. Stable changes are seen bilaterally. Electronically Signed   By: Inez Catalina M.D.   On: 02/26/2017 07:35   Dg Chest Port 1 View  Result Date: 02/25/2017 CLINICAL DATA:  CT-guided biopsy of the right lung with subsequent pneumothorax. Patient is undergone chest tube placement. EXAM: PORTABLE CHEST 1 VIEW COMPARISON:  CT scan of the chest of today's date performed during the biopsy. FINDINGS: There has been Re interval re-expansion of the right lung. No pneumothorax is evident currently. A small caliber pigtail chest tube is present along the lateral aspects of the right fifth and sixth ribs. There is interstitial prominence in the right mid lung. The background interstitial markings of both lungs are coarse though stable. The cardiac silhouette is enlarged. The pulmonary vascularity is normal. There is calcification in the wall of the aortic arch. IMPRESSION: Interval re-expansion of the right lung since chest tube placement. Electronically Signed   By: David  Martinique M.D.   On: 02/25/2017 12:24   Ct Perc Pleural Drain W/indwell Cath W/img Guide  Result Date: 02/25/2017 INDICATION: History of pulmonary fibrosis, now with indeterminate hypermetabolic right upper lobe pulmonary nodule. Please perform CT-guided biopsy for tissue diagnostic purposes. EXAM: 1. CT GUIDED RIGHT UPPER LOBE PULMONARY NODULE BIOPSY. 2. CT-GUIDED RIGHT-SIDED CHEST TUBE PLACEMENT. COMPARISON:  PET-CT - 02/09/2017; Chest CT - 01/27/2017 MEDICATIONS: None. ANESTHESIA/SEDATION: Fentanyl 25 mcg IV; Versed 0.5 mg IV Sedation time: 20 minutes; The patient was continuously monitored during the procedure by the interventional radiology nurse under my direct supervision. CONTRAST:  None COMPLICATIONS: SIR LEVEL C - Requires therapy, minor hospitalization (<48 hrs). Procedure complicated by development of a rapidly expanding right-sided pneumothorax requiring urgent chest tube placement. Fortunately,  the patient remained hemodynamically stable prior to successful chest tube placement. PROCEDURE: Informed consent was obtained from the patient following an explanation of the procedure, risks, benefits and alternatives. The patient understands,agrees and consents for the procedure. All questions were addressed. A time out was performed prior to the initiation of the procedure. The patient was positioned supine, slightly LPO on the CT table and a limited chest CT was performed for procedural planning demonstrating unchanged size and positioning of slightly spiculated hypermetabolic nodule within the subpleural aspect of the right upper lobe with dominant component measuring approximately 3.1 x 1.8 cm (image 20, series 2). The operative site was prepped and draped in the usual sterile fashion. Under sterile conditions and local anesthesia, a 17 gauge coaxial needle was advanced into the peripheral aspect of the nodule. Positioning was confirmed with intermittent CT fluoroscopy and followed by the acquisition of 2 core needle biopsies with an 18 gauge core needle biopsy device. The coaxial needle was removed and superficial hemostasis was achieved with manual compression. Immediate postprocedural chest CT demonstrated development of a moderate-sized pneumothorax which was found to enlarged with subsequent minimally delayed imaging. As such, decision was made to place a right-sided chest tube. The skin was anesthetized with 1% lidocaine. An  18 gauge trocar needle was advanced into the pleural space and air was aspirated. A short Amplatz wire was coiled within the pleural space. The track was dilated allowing placement of a 10 French percutaneous drainage catheter. The drainage catheter was connected to a three-way stopcock and approximately 300 cc of air was aspirated. Postprocedural imaging was obtained. The chest tube was connected to a pleural vac device and secured at the skin entrance site within interrupted  suture. A Vaseline gauze dressing and StatLock device were placed. The patient otherwise tolerated the procedure well and remained hemodynamically stable during the chest tube placement. IMPRESSION: 1. Technically successful CT guided core needle core biopsy of indeterminate hypermetabolic right upper lobe pulmonary nodule/mass. 2. Procedure complicated by development of a rapidly expanding right-sided pneumothorax requiring urgent chest tube placement. Fortunately, the patient remained hemodynamically stable during the chest tube placement, with resolution of the shortness of breath following placement of the chest tube to the pleural vac device and wall suction. Electronically Signed   By: Sandi Mariscal M.D.   On: 02/25/2017 11:24    Labs:  CBC:  Recent Labs  07/06/16 1128 07/13/16 1018 11/20/16 1048 02/25/17 0605 02/26/17 0318  WBC 7.9  --  8.6 8.9 8.4  HGB 16.6 17.3* 16.7 16.9 16.2  HCT 49.7 51.0 49.3 50.3 49.6  PLT 149  --  145* 139* 125*    COAGS:  Recent Labs  02/25/17 0605  INR 1.01  APTT 33    BMP:  Recent Labs  11/20/16 1048 12/04/16 1043 12/21/16 1018 02/26/17 0318  NA 142 142 139 138  K 4.2 4.0 3.7 3.3*  CL 100 98 95* 101  CO2 22 26 21 25   GLUCOSE 110* 115* 181* 175*  BUN 10 15 15 15   CALCIUM 8.9 9.5 9.2 8.5*  CREATININE 1.17 1.38* 1.46* 1.29*  GFRNONAA 65 53* 49* 56*  GFRAA 75 61 57* >60    LIVER FUNCTION TESTS:  Recent Labs  04/27/16 0825 09/17/16 0923  BILITOT 0.4 0.5  AST 13 17  ALT 14 17  ALKPHOS 49 49  PROT 7.4 7.5  ALBUMIN 3.9 4.1    Assessment and Plan: 1. PTX, s/p lung biopsy for mass -CXR today reveals resolution of PTX however, given his significant pulmonary disease we will need to be cautious prior to removing his CT.  Agree with CCMs note from yesterday that if CXR looks good today, that we would likely waterseal his tube today and repeat films in the morning.  Will await CCM's evaluation today to see if they still think this is  a good plan and we will go from there.  Continue inpatient for now.  If he is watersealed today and repeat film ok in the am, hopefully this tube can be removed and he can go home tomorrow.  Greatly appreciate CCM assistance with this patient.  2. A fib On heparin per pharmacy while CT in place Resume xarelto and pletal once chest tube able to be removed.  3. DM: Sugars are well controlled on home meds for now.  Continue with carb mod diet and home meds.  Electronically Signed: Henreitta Cea 02/26/2017, 11:13 AM   I spent a total of 15 Minutes at the the patient's bedside AND on the patient's hospital floor or unit, greater than 50% of which was counseling/coordinating care for PTX s/p lung bx, DM, a fib

## 2017-02-26 NOTE — Consult Note (Signed)
Name: Neil SOWASH Sr. MRN: [id number] DOB: Mar 21, 1951    ADMISSION DATE:  02/25/2017 CONSULTATION DATE:  02/25/17  REFERRING MD :  Pascal Lux  CHIEF COMPLAINT:  Pneumothorax s/p lung biopsy   HISTORY OF PRESENT ILLNESS:  Neil Couts. is a 66 y.o. 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 as outpatient.  He has long smoking history and continues to smoke (no desire or will power to quit). He had recent CT which demonstrated a right lung mass.  PET scan showed hypermetabolic lesion for which he was referred to IR for biopsy.  On 6/7, he was admitted for biopsy and procedure unfortunately complicated by rapidly expanding right pneumothorax requiring emergent chest tube placement.  Given his chronic lung disease and concerns for delayed healing of PTX, PCCM was asked to see in consultation.  SUBJECTIVE:  No sob at rest Pain controlled  VITAL SIGNS: Temp:  [97.9 F (36.6 C)-98.2 F (36.8 C)] 97.9 F (36.6 C) (06/08 1227) Pulse Rate:  [65-105] 71 (06/08 1227) Resp:  [18-20] 18 (06/08 1227) BP: (90-111)/(56-69) 91/56 (06/08 1227) SpO2:  [90 %-96 %] 95 % (06/08 1227)  PHYSICAL EXAMINATION: General: awake in good spirits Neuro: nonfocal, cooperative HEENT: jvd wnl PULM: slight ronchi rt base with inspiration CV: s1 s2 RRR  GI: soft, obese moderate. nt Extremities: chronic changes rt anterior      Recent Labs Lab 02/26/17 0318  NA 138  K 3.3*  CL 101  CO2 25  BUN 15  CREATININE 1.29*  GLUCOSE 175*    Recent Labs Lab 02/25/17 0605 02/26/17 0318  HGB 16.9 16.2  HCT 50.3 49.6  WBC 8.9 8.4  PLT 139* 125*   Ct Biopsy  Result Date: 02/25/2017 INDICATION: History of pulmonary fibrosis, now with indeterminate hypermetabolic  right upper lobe pulmonary nodule. Please perform CT-guided biopsy for tissue diagnostic purposes. EXAM: 1. CT GUIDED RIGHT UPPER LOBE PULMONARY NODULE BIOPSY. 2. CT-GUIDED RIGHT-SIDED CHEST TUBE PLACEMENT. COMPARISON:  PET-CT - 02/09/2017; Chest CT - 01/27/2017 MEDICATIONS: None. ANESTHESIA/SEDATION: Fentanyl 25 mcg IV; Versed 0.5 mg IV Sedation time: 20 minutes; The patient was continuously monitored during the procedure by the interventional radiology nurse under my direct supervision. CONTRAST:  None COMPLICATIONS: SIR LEVEL C - Requires therapy, minor hospitalization (<48 hrs). Procedure complicated by development of a rapidly expanding right-sided pneumothorax requiring urgent chest tube placement. Fortunately, the patient remained hemodynamically stable prior to successful chest tube placement. PROCEDURE: Informed consent was obtained from the patient following an explanation of the procedure, risks, benefits and alternatives. The patient understands,agrees and consents for the procedure. All questions were addressed. A time out was performed prior to the initiation of the procedure. The patient was positioned supine, slightly LPO on the CT table and a limited chest CT was performed for procedural planning demonstrating unchanged size and positioning of slightly spiculated hypermetabolic nodule within the subpleural aspect of the right upper lobe with dominant component measuring approximately 3.1 x 1.8 cm (image 20, series 2). The operative site was prepped and draped in the usual sterile fashion. Under sterile conditions and local anesthesia, a 17 gauge coaxial needle was advanced into the peripheral aspect of the nodule. Positioning was confirmed with intermittent CT fluoroscopy and followed by the acquisition of 2 core needle biopsies  with an 18 gauge core needle biopsy device. The coaxial needle was removed and superficial hemostasis was achieved with manual compression. Immediate postprocedural chest CT  demonstrated development of a moderate-sized pneumothorax which was found to enlarged with subsequent minimally delayed imaging. As such, decision was made to place a right-sided chest tube. The skin was anesthetized with 1% lidocaine. An 18 gauge trocar needle was advanced into the pleural space and air was aspirated. A short Amplatz wire was coiled within the pleural space. The track was dilated allowing placement of a 10 French percutaneous drainage catheter. The drainage catheter was connected to a three-way stopcock and approximately 300 cc of air was aspirated. Postprocedural imaging was obtained. The chest tube was connected to a pleural vac device and secured at the skin entrance site within interrupted suture. A Vaseline gauze dressing and StatLock device were placed. The patient otherwise tolerated the procedure well and remained hemodynamically stable during the chest tube placement. IMPRESSION: 1. Technically successful CT guided core needle core biopsy of indeterminate hypermetabolic right upper lobe pulmonary nodule/mass. 2. Procedure complicated by development of a rapidly expanding right-sided pneumothorax requiring urgent chest tube placement. Fortunately, the patient remained hemodynamically stable during the chest tube placement, with resolution of the shortness of breath following placement of the chest tube to the pleural vac device and wall suction. Electronically Signed   By: Sandi Mariscal M.D.   On: 02/25/2017 11:24   Portable Chest 1 View  Result Date: 02/26/2017 CLINICAL DATA:  Follow-up pneumothorax EXAM: PORTABLE CHEST 1 VIEW COMPARISON:  02/25/2017 FINDINGS: Cardiac shadow is stable. Right-sided chest tube is noted. No pneumothorax is seen. Stable right upper lobe mass lesion is again noted. Diffuse fibrotic changes are noted throughout both lungs. No new focal abnormality is noted. IMPRESSION: No recurrent pneumothorax is noted. Stable changes are seen bilaterally. Electronically  Signed   By: Inez Catalina M.D.   On: 02/26/2017 07:35   Dg Chest Port 1 View  Result Date: 02/25/2017 CLINICAL DATA:  CT-guided biopsy of the right lung with subsequent pneumothorax. Patient is undergone chest tube placement. EXAM: PORTABLE CHEST 1 VIEW COMPARISON:  CT scan of the chest of today's date performed during the biopsy. FINDINGS: There has been Re interval re-expansion of the right lung. No pneumothorax is evident currently. A small caliber pigtail chest tube is present along the lateral aspects of the right fifth and sixth ribs. There is interstitial prominence in the right mid lung. The background interstitial markings of both lungs are coarse though stable. The cardiac silhouette is enlarged. The pulmonary vascularity is normal. There is calcification in the wall of the aortic arch. IMPRESSION: Interval re-expansion of the right lung since chest tube placement. Electronically Signed   By: David  Martinique M.D.   On: 02/25/2017 12:24   Ct Perc Pleural Drain W/indwell Cath W/img Guide  Result Date: 02/25/2017 INDICATION: History of pulmonary fibrosis, now with indeterminate hypermetabolic right upper lobe pulmonary nodule. Please perform CT-guided biopsy for tissue diagnostic purposes. EXAM: 1. CT GUIDED RIGHT UPPER LOBE PULMONARY NODULE BIOPSY. 2. CT-GUIDED RIGHT-SIDED CHEST TUBE PLACEMENT. COMPARISON:  PET-CT - 02/09/2017; Chest CT - 01/27/2017 MEDICATIONS: None. ANESTHESIA/SEDATION: Fentanyl 25 mcg IV; Versed 0.5 mg IV Sedation time: 20 minutes; The patient was continuously monitored during the procedure by the interventional radiology nurse under my direct supervision. CONTRAST:  None COMPLICATIONS: SIR LEVEL C - Requires therapy, minor hospitalization (<48 hrs). Procedure complicated by development of a rapidly expanding right-sided pneumothorax requiring urgent chest tube placement.  Fortunately, the patient remained hemodynamically stable prior to successful chest tube placement. PROCEDURE:  Informed consent was obtained from the patient following an explanation of the procedure, risks, benefits and alternatives. The patient understands,agrees and consents for the procedure. All questions were addressed. A time out was performed prior to the initiation of the procedure. The patient was positioned supine, slightly LPO on the CT table and a limited chest CT was performed for procedural planning demonstrating unchanged size and positioning of slightly spiculated hypermetabolic nodule within the subpleural aspect of the right upper lobe with dominant component measuring approximately 3.1 x 1.8 cm (image 20, series 2). The operative site was prepped and draped in the usual sterile fashion. Under sterile conditions and local anesthesia, a 17 gauge coaxial needle was advanced into the peripheral aspect of the nodule. Positioning was confirmed with intermittent CT fluoroscopy and followed by the acquisition of 2 core needle biopsies with an 18 gauge core needle biopsy device. The coaxial needle was removed and superficial hemostasis was achieved with manual compression. Immediate postprocedural chest CT demonstrated development of a moderate-sized pneumothorax which was found to enlarged with subsequent minimally delayed imaging. As such, decision was made to place a right-sided chest tube. The skin was anesthetized with 1% lidocaine. An 18 gauge trocar needle was advanced into the pleural space and air was aspirated. A short Amplatz wire was coiled within the pleural space. The track was dilated allowing placement of a 10 French percutaneous drainage catheter. The drainage catheter was connected to a three-way stopcock and approximately 300 cc of air was aspirated. Postprocedural imaging was obtained. The chest tube was connected to a pleural vac device and secured at the skin entrance site within interrupted suture. A Vaseline gauze dressing and StatLock device were placed. The patient otherwise tolerated the  procedure well and remained hemodynamically stable during the chest tube placement. IMPRESSION: 1. Technically successful CT guided core needle core biopsy of indeterminate hypermetabolic right upper lobe pulmonary nodule/mass. 2. Procedure complicated by development of a rapidly expanding right-sided pneumothorax requiring urgent chest tube placement. Fortunately, the patient remained hemodynamically stable during the chest tube placement, with resolution of the shortness of breath following placement of the chest tube to the pleural vac device and wall suction. Electronically Signed   By: Sandi Mariscal M.D.   On: 02/25/2017 11:24    STUDIES:  CT biopsy 6/7 > successful bx of right lung mass but procedure c/b rapidly expanding right sided PTX.  SIGNIFICANT EVENTS  6/7 > admit.  ASSESSMENT / PLAN:  Right sided pneumothorax - as unfortunate complication following biopsy of right lung mass.  Now s/p chest tube placement. Right lung hypermetabolic mass - s/p biopsy 6/7 by IR. 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  Also exacerbated by right pneumothorax.  Unfortunately despite his chronic hypoxia, he is non-compliant with O2 at home. Tobacco cessation counseling. Plan: Continue supplemental O2 as needed to maintain SpO2 > 90%. Need to work on his home compliance Budesonide / Brovana  Atrovent nebs scheduled seem to have helped Continue nocturnal CPAP, no changes in settings Tobacco cessation counseling.  Lavon Paganini. Titus Mould, MD, [place] Pgr: [facility]

## 2017-02-26 NOTE — Telephone Encounter (Signed)
Called [facility] and spoke with a Education administrator and the Pharmacist. Pharmacist stated the Metolazone was on back order but the patient could cut the tablet in half if he had a pill cutter since the tablet is not scored. Pharmacist did not know when the 2.5 mg tablets would come in, she just stated they were on back order. She offered to fill rx with the 5 mg tablets of Metolazone if the patient had a pill cutter. I called Pt's other pharmacy, [facility] on [address] in [place] and spoke with the pharmacist and I asked him if [facility] carried the 2.5 mg tablets of Metolazone, and he stated yes they did. I told pharmacist I would send the Pt's prescription of Metolazone 2.5 mg to [facility]. I called the patient (who is currently in hospital), patient answered and I told him I sent his rx to the [place] of the 2. 5 tablets of Metolazone and he thanked me and stated he would pick up the rx when he got out of the hospital. Will route to Richardson Dopp, PA's covering [place].

## 2017-02-26 NOTE — Telephone Encounter (Signed)
Neil Carroll with [facility] [phone] ref [id number] calling to say the rx for Metolazone 2.5 mg is on back order and but they have a 5mg  tab that's not scored-pls advise

## 2017-02-27 ENCOUNTER — Inpatient Hospital Stay (HOSPITAL_COMMUNITY): Payer: Medicare Other

## 2017-02-27 LAB — GLUCOSE, CAPILLARY: GLUCOSE-CAPILLARY: 152 mg/dL — AB (ref 65–99)

## 2017-02-27 LAB — CBC
HCT: 48.3 % (ref 39.0–52.0)
HEMOGLOBIN: 15.4 g/dL (ref 13.0–17.0)
MCH: 29.9 pg (ref 26.0–34.0)
MCHC: 31.9 g/dL (ref 30.0–36.0)
MCV: 93.8 fL (ref 78.0–100.0)
PLATELETS: 127 10*3/uL — AB (ref 150–400)
RBC: 5.15 MIL/uL (ref 4.22–5.81)
RDW: 13.7 % (ref 11.5–15.5)
WBC: 7.4 10*3/uL (ref 4.0–10.5)

## 2017-02-27 LAB — HEPARIN LEVEL (UNFRACTIONATED): Heparin Unfractionated: 0.51 IU/mL (ref 0.30–0.70)

## 2017-02-27 NOTE — Progress Notes (Signed)
ANTICOAGULATION CONSULT NOTE - Follow Up Consult  Pharmacy Consult for Heparin Indication: atrial fibrillation  Allergies  Allergen Reactions  . Niacin Nausea Only    REACTION: upset stomach   Patient Measurements: Height: 5\' 11"  (180.3 cm) Weight: 264 lb (119.7 kg) IBW/kg (Calculated) : 75.3 Heparin Dosing Weight:    Vital Signs: Temp: 98.6 F (37 C) (06/09 0504) Temp Source: Oral (06/09 0504) BP: 114/74 (06/09 0927) Pulse Rate: 76 (06/09 0927)  Labs:  Recent Labs  02/25/17 0605 02/25/17 2205 02/26/17 0318 02/27/17 0308  HGB 16.9  --  16.2 15.4  HCT 50.3  --  49.6 48.3  PLT 139*  --  125* 127*  APTT 33  --   --   --   LABPROT 13.3  --   --   --   INR 1.01  --   --   --   HEPARINUNFRC  --  0.33 0.26* 0.51  CREATININE  --   --  1.29*  --    Estimated Creatinine Clearance: 74.2 mL/min (A) (by C-G formula based on SCr of 1.29 mg/dL (H)).  Assessment: Mr Rogersis a 66 y.o.maleon  Xarelto prior to admission for a fib s/p right lung  Biopsy. Heparin level 0.51, therapeutic this morning. Blood tinged sputum reported overnight. CBCstable; PLT low 127  Goal of Therapy:  Heparin level 0.3-0.7 units/ml Monitor platelets by anticoagulation protocol: Yes   Plan:  Continue heparin at 1550 units/hr Daily heparin level/CBC F/U CT removal and restart Xarelto Monitor blood in sputum  Georga Bora, PharmD Clinical Pharmacist 02/27/2017 10:41 AM

## 2017-02-27 NOTE — Progress Notes (Signed)
Removed CT. Pt. tolerated procedure well.

## 2017-02-27 NOTE — Progress Notes (Signed)
Name: Neil MONTROSE Sr. MRN: [id number] DOB: 15-Jan-1951    ADMISSION DATE:  02/25/2017 CONSULTATION DATE:  02/25/17  REFERRING MD :  Pascal Lux  CHIEF COMPLAINT:  Pneumothorax s/p lung biopsy   HISTORY OF PRESENT ILLNESS:  Neil Daniello. is a 66 y.o. 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 as outpatient.  He has long smoking history and continues to smoke (no desire or will power to quit). He had recent CT which demonstrated a right lung mass.  PET scan showed hypermetabolic lesion for which he was referred to IR for biopsy.  On 6/7, he was admitted for biopsy and procedure unfortunately complicated by rapidly expanding right pneumothorax requiring emergent chest tube placement.  Given his chronic lung disease and concerns for delayed healing of PTX, PCCM was asked to see in consultation.  SUBJECTIVE:  Pt anxious to go home.  Denies chest pain / SOB.  Reports blood tinged sputum overnight.  CT to water seal > no air leak, minimal drainage from tube.   VITAL SIGNS: Temp:  [97.9 F (36.6 C)-98.6 F (37 C)] 98.6 F (37 C) (06/09 0504) Pulse Rate:  [61-75] 61 (06/09 0504) Resp:  [18-20] 20 (06/09 0504) BP: (89-107)/(47-69) 95/52 (06/09 0504) SpO2:  [93 %-96 %] 93 % (06/09 0504)  PHYSICAL EXAMINATION: General: adult male in NAD, sitting on side of bed   HEENT: MM pink/moist, no jvd PSY: anxious Neuro: AAOx4, speech clear, MAE  CV: s1s2 rrr, no m/r/g PULM: even/non-labored, lungs bilaterally with posterior crackles, R pigtail catheter to water seal, no air leak even with strong cough OT:LXBW, non-tender, bsx4 active  Extremities: warm/dry, trace LE edema  Skin: no rashes or lesions   Recent Labs Lab 02/26/17 0318  NA 138  K 3.3*  CL  101  CO2 25  BUN 15  CREATININE 1.29*  GLUCOSE 175*    Recent Labs Lab 02/25/17 0605 02/26/17 0318 02/27/17 0308  HGB 16.9 16.2 15.4  HCT 50.3 49.6 48.3  WBC 8.9 8.4 7.4  PLT 139* 125* 127*   Ct Biopsy  Result Date: 02/25/2017 INDICATION: History of pulmonary fibrosis, now with indeterminate hypermetabolic right upper lobe pulmonary nodule. Please perform CT-guided biopsy for tissue diagnostic purposes. EXAM: 1. CT GUIDED RIGHT UPPER LOBE PULMONARY NODULE BIOPSY. 2. CT-GUIDED RIGHT-SIDED CHEST TUBE PLACEMENT. COMPARISON:  PET-CT - 02/09/2017; Chest CT - 01/27/2017 MEDICATIONS: None. ANESTHESIA/SEDATION: Fentanyl 25 mcg IV; Versed 0.5 mg IV Sedation time: 20 minutes; The patient was continuously monitored during the procedure by the interventional radiology nurse under my direct supervision. CONTRAST:  None COMPLICATIONS: SIR LEVEL C - Requires therapy, minor hospitalization (<48 hrs). Procedure complicated by development of a rapidly expanding right-sided pneumothorax requiring urgent chest tube placement. Fortunately, the patient remained hemodynamically stable prior to successful chest tube placement. PROCEDURE: Informed consent was obtained from the patient following an explanation of the procedure, risks, benefits and alternatives. The patient understands,agrees and consents for the procedure. All questions were addressed. A time out was performed prior to the initiation of the procedure. The patient was positioned supine, slightly LPO on the CT table and a limited chest CT was performed for procedural planning demonstrating unchanged size and positioning of slightly spiculated hypermetabolic nodule within the subpleural aspect of the right upper lobe with  dominant component measuring approximately 3.1 x 1.8 cm (image 20, series 2). The operative site was prepped and draped in the usual sterile fashion. Under sterile conditions and local anesthesia, a 17 gauge coaxial needle was advanced into  the peripheral aspect of the nodule. Positioning was confirmed with intermittent CT fluoroscopy and followed by the acquisition of 2 core needle biopsies with an 18 gauge core needle biopsy device. The coaxial needle was removed and superficial hemostasis was achieved with manual compression. Immediate postprocedural chest CT demonstrated development of a moderate-sized pneumothorax which was found to enlarged with subsequent minimally delayed imaging. As such, decision was made to place a right-sided chest tube. The skin was anesthetized with 1% lidocaine. An 18 gauge trocar needle was advanced into the pleural space and air was aspirated. A short Amplatz wire was coiled within the pleural space. The track was dilated allowing placement of a 10 French percutaneous drainage catheter. The drainage catheter was connected to a three-way stopcock and approximately 300 cc of air was aspirated. Postprocedural imaging was obtained. The chest tube was connected to a pleural vac device and secured at the skin entrance site within interrupted suture. A Vaseline gauze dressing and StatLock device were placed. The patient otherwise tolerated the procedure well and remained hemodynamically stable during the chest tube placement. IMPRESSION: 1. Technically successful CT guided core needle core biopsy of indeterminate hypermetabolic right upper lobe pulmonary nodule/mass. 2. Procedure complicated by development of a rapidly expanding right-sided pneumothorax requiring urgent chest tube placement. Fortunately, the patient remained hemodynamically stable during the chest tube placement, with resolution of the shortness of breath following placement of the chest tube to the pleural vac device and wall suction. Electronically Signed   By: Sandi Mariscal M.D.   On: 02/25/2017 11:24   Dg Chest Port 1 View  Result Date: 02/27/2017 CLINICAL DATA:  66 year old male with right pneumothorax following CT-guided right upper lobe pulmonary  nodule treated with CT-guided chest tube placement. EXAM: PORTABLE CHEST 1 VIEW COMPARISON:  02/26/2017 and earlier. FINDINGS: Portable AP semi upright view at 0610 hours. Lateral approach right pigtail chest tube remains in place. Resolved small volume right chest wall subcutaneous gas. No residual right pneumothorax identified. Underlying chronic lung disease with coarse reticulonodular opacity right greater than left. Stable cardiac size and mediastinal contours. Visualized tracheal air column is within normal limits. No pleural effusion or worsening ventilation. IMPRESSION: 1. Stable right chest tube.  No residual pneumothorax. 2. Underlying interstitial lung disease. No new cardiopulmonary abnormality. Electronically Signed   By: Genevie Ann M.D.   On: 02/27/2017 07:59   Portable Chest 1 View  Result Date: 02/26/2017 CLINICAL DATA:  Follow-up pneumothorax EXAM: PORTABLE CHEST 1 VIEW COMPARISON:  02/25/2017 FINDINGS: Cardiac shadow is stable. Right-sided chest tube is noted. No pneumothorax is seen. Stable right upper lobe mass lesion is again noted. Diffuse fibrotic changes are noted throughout both lungs. No new focal abnormality is noted. IMPRESSION: No recurrent pneumothorax is noted. Stable changes are seen bilaterally. Electronically Signed   By: Inez Catalina M.D.   On: 02/26/2017 07:35   Dg Chest Port 1 View  Result Date: 02/25/2017 CLINICAL DATA:  CT-guided biopsy of the right lung with subsequent pneumothorax. Patient is undergone chest tube placement. EXAM: PORTABLE CHEST 1 VIEW COMPARISON:  CT scan of the chest of today's date performed during the biopsy. FINDINGS: There has been Re interval re-expansion of the right lung. No pneumothorax is evident currently. A small caliber pigtail chest tube is  present along the lateral aspects of the right fifth and sixth ribs. There is interstitial prominence in the right mid lung. The background interstitial markings of both lungs are coarse though stable. The  cardiac silhouette is enlarged. The pulmonary vascularity is normal. There is calcification in the wall of the aortic arch. IMPRESSION: Interval re-expansion of the right lung since chest tube placement. Electronically Signed   By: David  Martinique M.D.   On: 02/25/2017 12:24   Ct Perc Pleural Drain W/indwell Cath W/img Guide  Result Date: 02/25/2017 INDICATION: History of pulmonary fibrosis, now with indeterminate hypermetabolic right upper lobe pulmonary nodule. Please perform CT-guided biopsy for tissue diagnostic purposes. EXAM: 1. CT GUIDED RIGHT UPPER LOBE PULMONARY NODULE BIOPSY. 2. CT-GUIDED RIGHT-SIDED CHEST TUBE PLACEMENT. COMPARISON:  PET-CT - 02/09/2017; Chest CT - 01/27/2017 MEDICATIONS: None. ANESTHESIA/SEDATION: Fentanyl 25 mcg IV; Versed 0.5 mg IV Sedation time: 20 minutes; The patient was continuously monitored during the procedure by the interventional radiology nurse under my direct supervision. CONTRAST:  None COMPLICATIONS: SIR LEVEL C - Requires therapy, minor hospitalization (<48 hrs). Procedure complicated by development of a rapidly expanding right-sided pneumothorax requiring urgent chest tube placement. Fortunately, the patient remained hemodynamically stable prior to successful chest tube placement. PROCEDURE: Informed consent was obtained from the patient following an explanation of the procedure, risks, benefits and alternatives. The patient understands,agrees and consents for the procedure. All questions were addressed. A time out was performed prior to the initiation of the procedure. The patient was positioned supine, slightly LPO on the CT table and a limited chest CT was performed for procedural planning demonstrating unchanged size and positioning of slightly spiculated hypermetabolic nodule within the subpleural aspect of the right upper lobe with dominant component measuring approximately 3.1 x 1.8 cm (image 20, series 2). The operative site was prepped and draped in the usual  sterile fashion. Under sterile conditions and local anesthesia, a 17 gauge coaxial needle was advanced into the peripheral aspect of the nodule. Positioning was confirmed with intermittent CT fluoroscopy and followed by the acquisition of 2 core needle biopsies with an 18 gauge core needle biopsy device. The coaxial needle was removed and superficial hemostasis was achieved with manual compression. Immediate postprocedural chest CT demonstrated development of a moderate-sized pneumothorax which was found to enlarged with subsequent minimally delayed imaging. As such, decision was made to place a right-sided chest tube. The skin was anesthetized with 1% lidocaine. An 18 gauge trocar needle was advanced into the pleural space and air was aspirated. A short Amplatz wire was coiled within the pleural space. The track was dilated allowing placement of a 10 French percutaneous drainage catheter. The drainage catheter was connected to a three-way stopcock and approximately 300 cc of air was aspirated. Postprocedural imaging was obtained. The chest tube was connected to a pleural vac device and secured at the skin entrance site within interrupted suture. A Vaseline gauze dressing and StatLock device were placed. The patient otherwise tolerated the procedure well and remained hemodynamically stable during the chest tube placement. IMPRESSION: 1. Technically successful CT guided core needle core biopsy of indeterminate hypermetabolic right upper lobe pulmonary nodule/mass. 2. Procedure complicated by development of a rapidly expanding right-sided pneumothorax requiring urgent chest tube placement. Fortunately, the patient remained hemodynamically stable during the chest tube placement, with resolution of the shortness of breath following placement of the chest tube to the pleural vac device and wall suction. Electronically Signed   By: Sandi Mariscal M.D.   On: 02/25/2017 11:24  STUDIES:  CT biopsy 6/7 > successful bx of  right lung mass but procedure c/b rapidly expanding right sided PTX.  SIGNIFICANT EVENTS  6/07  Admit. 6/08  CT to water seal  6/09  CT discontinued   ASSESSMENT / PLAN:  Right sided pneumothorax - as unfortunate complication following biopsy of right lung mass.  Now s/p chest tube placement. Right lung hypermetabolic mass - s/p biopsy 6/7 by IR. 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  Also exacerbated by right pneumothorax.  Unfortunately despite his chronic hypoxia, he is non-compliant with O2 at home. Tobacco cessation counseling. Plan: O2 to maintain sats > 90% Encourage home compliance with O2  Continue Budesonide + Brovana while inpatient  Return to advair at discharge  Atrovent nebs TID Continue nocturnal CPAP, no change in settings  Tobacco cessation counseling  Continue zaroxolyn + lasix    Neil Gens, NP-C  Pulmonary & Critical Care Pgr: [phone] or if no answer [phone] 02/27/2017, 8:38 AM   Attending Note:  I have examined patient, reviewed labs, studies and notes. I have discussed the case with B Ollis, and I agree with the data and plans as amended above. 66 yo man with COPD and ILD, developed R PTX after needle biopsy. No airleak on exam today. No PTX on CXR. Chest tube removed earlier today and follow up CXR with good inflation, no PTX. Agree with d/c to home today. He knows to follow with Dr Elsworth Soho to review his biopsy results.   Baltazar Apo, MD, PhD 02/27/2017, 3:24 PM [place] [facility] [phone] or if no answer  [phone]

## 2017-02-27 NOTE — Discharge Summary (Signed)
Patient ID: Neil Housekeeper Sr. MRN: [id number] DOB/AGE: 24-Dec-1950 66 y.o.  Admit date: 02/25/2017 Discharge date: 02/27/2017  Supervising Physician: Sandi Mariscal  Patient Status: [facility] - In-pt  Admission Diagnoses: Pneumothorax  Discharge Diagnoses:  Active Problems:   Pneumothorax after biopsy   Status post biopsy   Discharged Condition: good  Hospital Course:  Rehaan Viloria. is a 66 y.o. male who has a history of pulmonary fibrosis and interstitial lung disease, a fib, HTN, and tobacco abuse who was found to have a mass in his right lung.  He is followed by [name] who referred him to IR for biopsy.  He is on advair and is prescribed 4L continuous O2, but wears this as desired at home.  He takes Xarelto as well as Pletal which were held for procedure.  Patient underwent lung biopsy 6/7 and unfortunately had a pneumothorax after biopsy.  A chest tube was placed in CT and patient was admitted to the hospital with the assistance of pulmonology. Patient improved with chest tube which was subsequently placed to water seal 6/8.  His CXR this AM with no recurrence of pneumothorax with chest tube to water seal.  Chest tube was removed and CXR post-removal again shows no penumothorax.  Patient states he is doing well today.  He has been tolerating his meals without nausea or vomiting.  He endorses soreness at chest tube insertion site, but denies pain or limited motion.  He is voiding well and has been ambulating in his room since tube removed.  He states he feels well and is ready to go home.  Patient stable for discharge and will follow-up with his pulmonologist next week.  He may resume his home anticoagulation.   Consults: pulmonary/intensive care  Treatments: Chest tube placement 02/26/17  Discharge Exam: Blood pressure 97/66, pulse 74, temperature 98.1 F (36.7 C), temperature source Oral, resp. rate 20, height 5\' 11"  (1.803 m), weight 264 lb (119.7 kg), SpO2 93 %. General  appearance: alert and no distress Resp: bilateral crackles Cardio: regular rate and rhythm, S1, S2 normal, no murmur, click, rub or gallop Skin: Skin color, texture, turgor normal. No rashes or lesions or Dressing in place over chest tube removal site.  Nontender to palpation  Disposition: 01-Home or Self Care  Discharge Instructions    Call MD for:  difficulty breathing, headache or visual disturbances    Complete by:  As directed    Call MD for:  extreme fatigue    Complete by:  As directed    Call MD for:  hives    Complete by:  As directed    Call MD for:  persistant dizziness or light-headedness    Complete by:  As directed    Call MD for:  persistant nausea and vomiting    Complete by:  As directed    Call MD for:  redness, tenderness, or signs of infection (pain, swelling, redness, odor or green/yellow discharge around incision site)    Complete by:  As directed    Call MD for:  severe uncontrolled pain    Complete by:  As directed    Call MD for:  temperature >100.4    Complete by:  As directed    Diet - low sodium heart healthy    Complete by:  As directed    Discharge instructions    Complete by:  As directed    May resume Xarelto tomorrow   Driving Restrictions    Complete  by:  As directed    No driving for 48 hrs after procedure   Increase activity slowly    Complete by:  As directed    Lifting restrictions    Complete by:  As directed    Do not lift more than 10 lbs for 1 week.     Allergies as of 02/27/2017      Reactions   Niacin Nausea Only   REACTION: upset stomach      Medication List    TAKE these medications   acetaminophen 650 MG CR tablet Commonly known as:  TYLENOL Take 650 mg by mouth every 8 (eight) hours as needed for pain.   CENTRUM ADULTS Tabs Take 1 tablet by mouth daily.   cilostazol 100 MG tablet Commonly known as:  PLETAL take 1 tablet by mouth twice a day   clobetasol 0.05 % topical foam Commonly known as:  OLUX Apply 1  application topically 2 (two) times daily as needed.   collagenase ointment Commonly known as:  SANTYL Apply 1 application topically daily as needed.   diltiazem 180 MG 24 hr capsule Commonly known as:  CARTIA XT Take 1 capsule (180 mg total) by mouth 2 (two) times daily.   ENBREL SURECLICK 50 MG/ML injection Generic drug:  etanercept Inject 50 mg into the skin 2 (two) times a week. Inject on Sundays & Wednesdays   Fish Oil 1000 MG Caps Take 2 capsules by mouth 2 (two) times daily.   Flax Seed Oil 1000 MG Caps Take 1 capsule by mouth 2 (two) times daily.   Fluticasone-Salmeterol 100-50 MCG/DOSE Aepb Commonly known as:  ADVAIR Inhale 1 puff into the lungs 2 (two) times daily.   furosemide 40 MG tablet Commonly known as:  LASIX Take 2 tablets (80 mg total) by mouth 2 (two) times daily.   LEVEMIR 100 UNIT/ML injection Generic drug:  insulin detemir Inject 26 Units into the skin at bedtime.   losartan 25 MG tablet Commonly known as:  COZAAR Take 1 tablet (25 mg total) by mouth daily.   metFORMIN 1000 MG tablet Commonly known as:  GLUCOPHAGE Take 1,000 mg by mouth 2 (two) times daily with a meal.   metolazone 2.5 MG tablet Commonly known as:  ZAROXOLYN Take 1 tablet (2.5 mg total) by mouth as directed. TAKE 1 TABLET TWICE A WEEK   metoprolol tartrate 100 MG tablet Commonly known as:  LOPRESSOR Take 100-150 mg by mouth See admin instructions. 1 tablet every morning And take 1 and 1/2 tablets by mouth every evening   mupirocin ointment 2 % Commonly known as:  BACTROBAN Apply 1 application topically 2 (two) times daily as needed (affected areas).   potassium chloride SA 20 MEQ tablet Commonly known as:  K-DUR,KLOR-CON Take 1 tablet (20 mEq total) by mouth 2 (two) times daily.   rivaroxaban 20 MG Tabs tablet Commonly known as:  XARELTO Take 20 mg by mouth every morning.   rosuvastatin 10 MG tablet Commonly known as:  CRESTOR Take 1 tablet (10 mg total) by mouth  daily.   tamsulosin 0.4 MG Caps capsule Commonly known as:  FLOMAX Take 0.4 mg by mouth daily.   triamcinolone cream 0.1 % Commonly known as:  KENALOG Apply 1 application topically 2 (two) times daily as needed (affected area).   VICTOZA 18 MG/3ML Sopn Generic drug:  liraglutide INJECT 0.3ML (=1.8MG )      SUBCUTANEOUSLY DAILY         Electronically Signed: Docia Barrier, PA  02/27/2017, 3:07 PM   I have spent Greater Than 30 Minutes discharging [place].[name]

## 2017-02-27 NOTE — Progress Notes (Signed)
Discharge instructions and including medication instructions given to patient using teach back. Patient was able to verbalize understanding and all questions answered. PIV removed.

## 2017-03-01 ENCOUNTER — Ambulatory Visit (HOSPITAL_COMMUNITY): Payer: Medicare Other

## 2017-03-01 ENCOUNTER — Telehealth: Payer: Self-pay | Admitting: *Deleted

## 2017-03-01 ENCOUNTER — Telehealth: Payer: Self-pay | Admitting: Pulmonary Disease

## 2017-03-01 DIAGNOSIS — C3491 Malignant neoplasm of unspecified part of right bronchus or lung: Secondary | ICD-10-CM

## 2017-03-01 NOTE — Telephone Encounter (Signed)
Pl refer to [place]  - we will await PFTs on 6/22 to decide if he is a candidate for resection

## 2017-03-01 NOTE — Telephone Encounter (Signed)
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  Will send to Dr Elsworth Soho as Juluis Rainier.

## 2017-03-01 NOTE — Telephone Encounter (Signed)
Spoke with pt. He states that RA performed a procedure on him. States that he had a question about how to change the bandages. Pt was not given instructions on this when he left the hospital. I attempted to help him as much as I could due to the fact that I could not locate in his chart what type of procedure was performed. Nothing further was needed.

## 2017-03-02 ENCOUNTER — Encounter: Payer: Self-pay | Admitting: Internal Medicine

## 2017-03-02 ENCOUNTER — Telehealth: Payer: Self-pay | Admitting: Internal Medicine

## 2017-03-02 ENCOUNTER — Encounter: Payer: Self-pay | Admitting: *Deleted

## 2017-03-02 NOTE — Progress Notes (Signed)
Pathology positive for malignancy, he will be set up to see Dr. Julien Nordmann.

## 2017-03-02 NOTE — Telephone Encounter (Signed)
Appt has been scheduled for the pt to see Dr. Julien Nordmann on 6/19 at 215pm. Letter mailed with appt information

## 2017-03-02 NOTE — Telephone Encounter (Signed)
Referral has been made.

## 2017-03-02 NOTE — Progress Notes (Signed)
Oncology Nurse Navigator Documentation  Oncology Nurse Navigator Flowsheets 03/02/2017  Navigator Location [facility]  Referral date to RadOnc/MedOnc 03/01/2017  Navigator Encounter Type Other/I received referral on Neil Carroll from [name] yesterday.  I updated new patient coordinator to call patient to see Dr. Julien Nordmann on 03/09/17 at 2:15 with labs before appt.   Treatment Phase Pre-Tx/Tx Discussion  Barriers/Navigation Needs Coordination of Care  Interventions Coordination of Care  Coordination of Care Appts  Acuity Level 1  Time Spent with Patient 30

## 2017-03-04 ENCOUNTER — Other Ambulatory Visit: Payer: Self-pay

## 2017-03-04 ENCOUNTER — Encounter: Payer: Self-pay | Admitting: Cardiothoracic Surgery

## 2017-03-04 ENCOUNTER — Other Ambulatory Visit: Payer: Self-pay | Admitting: *Deleted

## 2017-03-04 ENCOUNTER — Institutional Professional Consult (permissible substitution) (INDEPENDENT_AMBULATORY_CARE_PROVIDER_SITE_OTHER): Payer: Medicare Other | Admitting: Cardiothoracic Surgery

## 2017-03-04 ENCOUNTER — Ambulatory Visit (HOSPITAL_COMMUNITY)
Admission: RE | Admit: 2017-03-04 | Discharge: 2017-03-04 | Disposition: A | Discharge status: Home / Self Care | Payer: Medicare Other | Source: Ambulatory Visit | Attending: Cardiothoracic Surgery | Admitting: Cardiothoracic Surgery

## 2017-03-04 ENCOUNTER — Encounter: Payer: Self-pay | Admitting: *Deleted

## 2017-03-04 VITALS — BP 98/64 | HR 65 | Resp 18 | Ht 71.0 in | Wt 268.0 lb

## 2017-03-04 DIAGNOSIS — J8410 Pulmonary fibrosis, unspecified: Secondary | ICD-10-CM

## 2017-03-04 DIAGNOSIS — C3411 Malignant neoplasm of upper lobe, right bronchus or lung: Secondary | ICD-10-CM | POA: Diagnosis not present

## 2017-03-04 DIAGNOSIS — R918 Other nonspecific abnormal finding of lung field: Secondary | ICD-10-CM | POA: Diagnosis not present

## 2017-03-04 DIAGNOSIS — I25709 Atherosclerosis of coronary artery bypass graft(s), unspecified, with unspecified angina pectoris: Secondary | ICD-10-CM | POA: Diagnosis not present

## 2017-03-04 LAB — PULMONARY FUNCTION TEST
DL/VA % pred: 50 %
DL/VA: 2.36 ml/min/mmHg/L
DLCO unc % pred: 33 %
DLCO unc: 11.15 ml/min/mmHg
FEF 25-75 Post: 1.55 L/sec
FEF 25-75 Pre: 1.55 L/sec
FEF2575-%Change-Post: 0 %
FEF2575-%Pred-Post: 57 %
FEF2575-%Pred-Pre: 57 %
FEV1-%Change-Post: 0 %
FEV1-%Pred-Post: 79 %
FEV1-%Pred-Pre: 79 %
FEV1-Post: 2.46 L
FEV1-Pre: 2.46 L
FEV1FVC-%Change-Post: -2 %
FEV1FVC-%Pred-Pre: 90 %
FEV6-%Change-Post: 1 %
FEV6-%Pred-Post: 91 %
FEV6-%Pred-Pre: 90 %
FEV6-Post: 3.55 L
FEV6-Pre: 3.51 L
FEV6FVC-%Change-Post: -1 %
FEV6FVC-%Pred-Post: 101 %
FEV6FVC-%Pred-Pre: 102 %
FVC-%Change-Post: 2 %
FVC-%Pred-Post: 89 %
FVC-%Pred-Pre: 87 %
FVC-Post: 3.63 L
FVC-Pre: 3.55 L
Post FEV1/FVC ratio: 68 %
Post FEV6/FVC ratio: 98 %
Pre FEV1/FVC ratio: 69 %
Pre FEV6/FVC Ratio: 99 %
RV % pred: 62 %
RV: 1.53 L
TLC % pred: 73 %
TLC: 5.32 L

## 2017-03-04 MED ORDER — ALBUTEROL SULFATE (2.5 MG/3ML) 0.083% IN NEBU
2.5000 mg | INHALATION_SOLUTION | Freq: Once | RESPIRATORY_TRACT | Status: AC
  Administered 2017-03-04: 2.5 mg via RESPIRATORY_TRACT

## 2017-03-04 NOTE — Patient Instructions (Signed)
Lung Cancer Lung cancer occurs when abnormal cells in the lung grow out of control and form a mass (tumor). There are several types of lung cancer. The two most common types are:  Non-small cell. In this type of lung cancer, abnormal cells are larger and grow more slowly than those of small cell lung cancer.  Small cell. In this type of lung cancer, abnormal cells are smaller than those of non-small cell lung cancer. Small cell lung cancer gets worse faster than non-small cell lung cancer.  What are the causes? The leading cause of lung cancer is smoking tobacco. The second leading cause is radon exposure. What increases the risk?  Smoking tobacco.  Exposure to secondhand tobacco smoke.  Exposure to radon gas.  Exposure to asbestos.  Exposure to arsenic in drinking water.  Air pollution.  Family or personal history of lung cancer.  Lung radiation therapy.  Being older than 65 years. What are the signs or symptoms? In the early stages, symptoms may not be present. As the cancer progresses, symptoms may include:  A lasting cough, possibly with blood.  Fatigue.  Unexplained weight loss.  Shortness of breath.  Wheezing.  Chest pain.  Loss of appetite.  Symptoms of advanced lung cancer include:  Hoarseness.  Bone or joint pain.  Weakness.  Nail problems.  Face or arm swelling.  Paralysis of the face.  Drooping eyelids.  How is this diagnosed? Lung cancer can be identified with a physical exam and with tests such as:  A chest X-ray.  A CT scan.  Blood tests.  A biopsy.  After a diagnosis is made, you will have more tests to determine the stage of the cancer. The stages of non-small cell lung cancer are:  Stage 0, also called carcinoma in situ. At this stage, abnormal cells are found in the inner lining of your lung or lungs.  Stage I. At this stage, abnormal cells have grown into a tumor that is no larger than 5 cm across. The cancer has entered  the deeper lung tissue but has not yet entered the lymph nodes or other parts of the body.  Stage II. At this stage, the tumor is 7 cm across or smaller and has entered nearby lymph nodes. Or, the tumor is 5 cm across or smaller and has invaded surrounding tissue but is not found in nearby lymph nodes. There may be more than one tumor present.  Stage III. At this stage, the tumor may be any size. There may be more than one tumor in the lungs. The cancer cells have spread to the lymph nodes and possibly to other organs.  Stage IV. At this stage, there are tumors in both lungs and the cancer has spread to other areas of the body.  The stages of small cell lung cancer are:  Limited. At this stage, the cancer is found only on one side of the chest.  Extensive. At this stage, the cancer is in the lungs and in tissues on the other side of the chest. The cancer has spread to other organs or is found in the fluid between the layers of your lungs.  How is this treated? Depending on the type and stage of your lung cancer, you may be treated with:  Surgery. This is done to remove a tumor.  Radiation therapy. This treatment destroys cancer cells using X-rays or other types of radiation.  Chemotherapy. This treatment uses medicines to destroy cancer cells.  Targeted therapy. This treatment   aims to destroy only cancer cells instead of all cells as other therapies do.  You may also have a combination of treatments. Follow these instructions at home:  Do not use any tobacco products. This includes cigarettes, chewing tobacco, and electronic cigarettes. If you need help quitting, ask your health care provider.  Take medicines only as directed by your health care provider.  Eat a healthy diet. Work with a dietitian to make sure you are getting the nutrition you need.  Consider joining a support group or seeking counseling to help you cope with the stress of having lung cancer.  Let your cancer  specialist (oncologist) know if you are admitted to the hospital.  Keep all follow-up visits as directed by your health care provider. This is important. Contact a health care provider if:  You lose weight without trying.  You have a persistent cough and wheezing.  You feel short of breath.  You tire easily.  You experience bone or joint pain.  You have difficulty swallowing.  You feel hoarse or notice your voice changing.  Your pain medicine is not helping. Get help right away if:  You cough up blood.  You have new breathing problems.  You develop chest pain.  You develop swelling in: ? One or both ankles or legs. ? Your face, neck, or arms.  You are confused.  You experience paralysis in your face or a drooping eyelid. This information is not intended to replace advice given to you by your health care provider. Make sure you discuss any questions you have with your health care provider. Document Released: 12/14/2000 Document Revised: 02/13/2016 Document Reviewed: 01/11/2014 [employer] Interactive Patient Education  2017 [employer].

## 2017-03-04 NOTE — Progress Notes (Signed)
[address]       [place] [address]             [phone]                    Neil C Valin Sr. [place] Medical Record #[id number] Date of Birth: 1950-10-26  Referring: Rigoberto Noel, MD Primary Care: Hoyt Koch, MD  Chief Complaint:    Chief Complaint  Patient presents with  . Lung Cancer    RULobe per NB 02/25/17.Marland KitchenMarland KitchenCT5/9, PET 5/22, PFT 6/22.Marland KitchenMarland KitchenON 02 SINCE 01/15/17    History of Present Illness:    Neil HEINDL Sr. 66 y.o. male is seen in the office  Today. He comes in today to learn the results of a needle biopsy done on the right chest. The patient had a CT scan on 01/27/2017 revealed a right upper lobe lung mass pleural-based subsequent PET scan on 522 confirmed hypermetabolic area right upper lobe without nodal involvement. The patient suffered a pneumothorax and was hospitalized from CT-guided needle biopsy. He comes in today asking what that the op see results show.   Patient notes that he had been seeing Dr. Radford Pax for sleep apnea, because of his respiratory difficulties he was referred to pulmonology.   Patient has history of  chronic diastolic heart failure. Other problems include atrial flutter status post RFCA, PAD with bilateral SFA occlusions, CAD treated medically,  sleep apnea, DM, ongoing tobacco abuse, COPD. The patient has had recurrent atrial fibrillation/flutter and underwent cardioversion in 10/17. This was unsuccessful  We had  pulmonary function studies done today  prior to seeing.  Current Activity/ Functional Status:  Patient is independent with mobility/ambulation, transfers, ADL's, IADL's.   Zubrod Score: At the time of surgery this patient's most appropriate activity status/level should be described as: []     0    Normal activity, no symptoms []     1    Restricted in physical strenuous activity but ambulatory, able to do out light work []     2    Ambulatory and capable of self care, unable to do  work activities, up and about               >50 % of waking hours                              []     3    Only limited self care, in bed greater than 50% of waking hours []     4    Completely disabled, no self care, confined to bed or chair []     5    Moribund   Past Medical History:  Diagnosis Date  . Adenomatous colon polyp   . Aortic insufficiency    Echo 3/18: Severe concentric LVH, EF 60-65, normal wall motion, moderate AI, mild LAE, mild TR  . Arthritis    left hip replacement  . Atrial flutter ([place])    onset  2011. s/p EPS/RFA 12/2011  . Cataract   . Chronic bronchitis   . Contact dermatitis and other eczema due to plants (except food)   . COPD (chronic obstructive pulmonary disease) ([place])   . GERD (gastroesophageal reflux disease)   . Hyperlipidemia   . Hypertension   . LV dysfunction    EF 45-50% 12/2011  . OSA (obstructive sleep apnea) 10/13/2016   Mild with AHI 9.7/hr with  significant oxygen desaturations as low as 76% now on CPAP  . Other peripheral vascular disease(443.89)    bilateral lower extremity  . Tobacco use disorder    dependent  . Transient global amnesia   . Tuberculosis   . Type II diabetes mellitus ([place])     Past Surgical History:  Procedure Laterality Date  . A FLUTTER ABLATION N/A 12/28/2011   Procedure: ABLATION A FLUTTER;  Surgeon: Evans Lance, MD;  Location: [facility] CATH LAB;  Service: Cardiovascular;  Laterality: N/A;  . CARDIAC CATHETERIZATION N/A 07/10/2015   Procedure: Left Heart Cath and Coronary Angiography;  Surgeon: Sherren Mocha, MD;  Location: [facility] CV LAB;  Service: Cardiovascular;  Laterality: N/A;  . CARDIAC ELECTROPHYSIOLOGY MAPPING AND ABLATION  03/2010  . CARDIOVERSION N/A 07/13/2016   Procedure: CARDIOVERSION;  Surgeon: Skeet Latch, MD;  Location: [facility];  Service: Cardiovascular;  Laterality: N/A;  . COLONOSCOPY    . colonoscopy with polypectomy  2006 & 2011   Dr [name]  . CYSTOSCOPY  11/2007   Dr Jeffie Pollock  .  PARTIAL HIP ARTHROPLASTY Right 01/2000   "Right; replaced ball & stem"  . PARTIAL HIP ARTHROPLASTY Left   . POLYPECTOMY    . TEE WITHOUT CARDIOVERSION  12/28/2011   Procedure: TRANSESOPHAGEAL ECHOCARDIOGRAM (TEE);  Surgeon: Larey Dresser, MD;  Location[facility] ENDOSCOPY;  Service: Cardiovascular;  Laterality: N/A;    Family History  Problem Relation Age of Onset  . Stroke Mother 88  . Hypertension Mother   . Diabetes Mother   . Diabetes Paternal Grandmother   . Lung cancer Father        smoker  . Diabetes Brother   . Heart attack Brother 54  . Diabetes Brother   . Hepatitis C Brother   . Throat cancer Paternal Uncle        1/2 uncle  . Heart attack Brother 77  . Crohn's disease Son   . Colon cancer Neg Hx   . Esophageal cancer Neg Hx   . Rectal cancer Neg Hx   . Stomach cancer Neg Hx     Social History   Social History  . Marital status: Widowed    Spouse name: N/A  . Number of children: 5  . Years of education: N/A   Occupational History  . security guard    Social History Main Topics  . Smoking status: Current Every Day Smoker    Packs/day: 1.00    Years: 44.00    Types: Cigarettes  . Smokeless tobacco: Never Used     Comment: started @ age 3, up to 2 ppd;1.25-1.5 ppd as of 11/23/13  . Alcohol use No     Comment:  11/23/13 "2-3 drinks per year"  . Drug use: No  . Sexual activity: Not Currently   Other Topics Concern  . Not on file   Social History Narrative  . No narrative on file    History  Smoking Status  . Current Every Day Smoker  . Packs/day: 1.00  . Years: 44.00  . Types: Cigarettes  Smokeless Tobacco  . Never Used    Comment: started @ age 27, up to 2 ppd;1.25-1.5 ppd as of 11/23/13    History  Alcohol Use No    Comment:  11/23/13 "2-3 drinks per year"     Allergies  Allergen Reactions  . Niacin Nausea Only    REACTION: upset stomach    Current Outpatient Prescriptions  Medication Sig Dispense Refill  . acetaminophen (TYLENOL)  650 MG  CR tablet Take 650 mg by mouth every 8 (eight) hours as needed for pain.    . cilostazol (PLETAL) 100 MG tablet take 1 tablet by mouth twice a day 180 tablet 1  . clobetasol (OLUX) 0.05 % topical foam Apply 1 application topically 2 (two) times daily as needed.   0  . collagenase (SANTYL) ointment Apply 1 application topically daily as needed.     . diltiazem (CARTIA XT) 180 MG 24 hr capsule Take 1 capsule (180 mg total) by mouth 2 (two) times daily. 180 capsule 3  . etanercept (ENBREL SURECLICK) 50 MG/ML injection Inject 50 mg into the skin 2 (two) times a week. Inject on Sundays & Wednesdays    . Flaxseed, Linseed, (FLAX SEED OIL) 1000 MG CAPS Take 1 capsule by mouth 2 (two) times daily.     . Fluticasone-Salmeterol (ADVAIR) 100-50 MCG/DOSE AEPB Inhale 1 puff into the lungs 2 (two) times daily. 180 each 1  . furosemide (LASIX) 40 MG tablet Take 2 tablets (80 mg total) by mouth 2 (two) times daily. 120 tablet 11  . insulin detemir (LEVEMIR) 100 UNIT/ML injection Inject 26 Units into the skin at bedtime.    [name] losartan (COZAAR) 25 MG tablet Take 1 tablet (25 mg total) by mouth daily. 90 tablet 3  . metFORMIN (GLUCOPHAGE) 1000 MG tablet Take 1,000 mg by mouth 2 (two) times daily with a meal.    . metolazone (ZAROXOLYN) 2.5 MG tablet Take 1 tablet (2.5 mg total) by mouth as directed. TAKE 1 TABLET TWICE A WEEK 30 tablet 3  . metoprolol (LOPRESSOR) 100 MG tablet Take 100-150 mg by mouth See admin instructions. 1 tablet every morning And take 1 and 1/2 tablets by mouth every evening    . Multiple Vitamins-Minerals (CENTRUM ADULTS) TABS Take 1 tablet by mouth daily.    . mupirocin ointment (BACTROBAN) 2 % Apply 1 application topically 2 (two) times daily as needed (affected areas).     . Omega-3 Fatty Acids (FISH OIL) 1000 MG CAPS Take 2 capsules by mouth 2 (two) times daily.     . potassium chloride SA (K-DUR,KLOR-CON) 20 MEQ tablet Take 1 tablet (20 mEq total) by mouth 2 (two) times daily. 60 tablet 11    . rivaroxaban (XARELTO) 20 MG TABS tablet Take 20 mg by mouth every morning.     . rosuvastatin (CRESTOR) 10 MG tablet Take 1 tablet (10 mg total) by mouth daily. 90 tablet 3  . tamsulosin (FLOMAX) 0.4 MG CAPS capsule Take 0.4 mg by mouth daily.     [name] triamcinolone cream (KENALOG) 0.1 % Apply 1 application topically 2 (two) times daily as needed (affected area).     [name] VICTOZA 18 MG/3ML SOPN INJECT 0.3ML (=1.8MG )      SUBCUTANEOUSLY DAILY 27 mL 1   No current facility-administered medications for this visit.       Review of Systems:     Cardiac Review of Systems: Y or N  Chest Pain Florencio.Farrier    ]  Resting SOB [ y  ] Exertional SOB  Blue.Reese  ]  Vertell Limber Florencio.Farrier  ]   Pedal Edema [   ]    Palpitations Kaito.Grams  ] Syncope  Florencio.Farrier  ]   Presyncope [ n  ]  General Review of Systems: [Y] = yes [  ]=no Constitional: recent weight change [n  ];  Wt loss over the last 3 months [   ] anorexia n[  ];  fatigue [  ]; nausea [  ]; night sweats [  ]; fever [n  ]; or chills [  ];          Dental: poor dentition[  ]; Last Dentist visit:   Eye : blurred vision [  ]; diplopia [   ]; vision changes [  ];  Amaurosis fugax[  ]; Resp: cough Blue.Reese  ];  wheezing[y  ];  hemoptysis[n  ]; shortness of breath[ y ]; paroxysmal nocturnal dyspnea[  ]; dyspnea on exertion[y  ]; or orthopnea[  ];  GI:  gallstones[  ], vomiting[  ];  dysphagia[  ]; melena[  ];  hematochezia [  ]; heartburn[  ];   Hx of  Colonoscopy[  ]; GU: kidney stones [n  ]; hematuria[  ];   dysuria [n  ];  nocturia[  ];  history of     obstruction [ n ]; urinary frequency [  ]             Skin: rash, swelling[  ];, hair loss[  ];  peripheral edema[  ];  or itching[  ]; Musculosketetal: myalgias[  ];  joint swelling[  ];  joint erythema[  ];  joint pain[  ];  back pain[  ];  Heme/Lymph: bruising[  ];  bleeding[  ];  anemia[  ];  Neuro: TIA[ n ];  headaches[  ];  stroke[  ];  vertigo[  ];  seizures[  ];   paresthesias[  ];  difficulty walking[ n ];  Psych:depression[  ]; anxiety[   ];  Endocrine: diabetes[ n ];  thyroid dysfunction[  ];  Immunizations: Flu up to date [  ]; Pneumococcal up to date [  ];  Other:  Physical Exam: BP 98/64 (BP Location: Right Arm, Patient Position: Sitting, Cuff Size: Large)   Pulse 65   Resp 18   Ht 5\' 11"  (1.803 m)   Wt 268 lb (121.6 kg)   SpO2 (!) 85% Comment: ON 4L02  BMI 37.38 kg/m  Patient on 02  PHYSICAL EXAMINATION: General appearance: alert, cooperative, appears older than stated age and mild distress Head: Normocephalic, without obvious abnormality, atraumatic Neck: no adenopathy, no carotid bruit, no JVD, supple, symmetrical, trachea midline and thyroid not enlarged, symmetric, no tenderness/mass/nodules Lymph nodes: Cervical, supraclavicular, and axillary nodes normal. Resp: diminished breath sounds bibasilar Back: symmetric, no curvature. ROM normal. No CVA tenderness. Cardio: irregularly irregular rhythm GI: soft, non-tender; bowel sounds normal; no masses,  no organomegaly Extremities: extremities normal, atraumatic, no cyanosis or edema and Homans sign is negative, no sign of DVT Neurologic: Grossly normal  Diagnostic Studies & Laboratory data:     Recent Radiology Findings:   Nm Pet Image Initial (pi) Skull Base To Thigh  Result Date: 02/09/2017 CLINICAL DATA:  Initial treatment strategy for right lung lesion. EXAM: NUCLEAR MEDICINE PET SKULL BASE TO THIGH TECHNIQUE: 13.57 mCi F-18 FDG was injected intravenously. Full-ring PET imaging was performed from the skull base to thigh after the radiotracer. CT data was obtained and used for attenuation correction and anatomic localization. FASTING BLOOD GLUCOSE:  Value: 145 mg/dl COMPARISON:  Chest CT 01/27/2017 FINDINGS: NECK No hypermetabolic lymph nodes in the neck. CHEST The spiculated irregular 3 cm right upper lobe lung lesion demonstrates hypermetabolism with SUV max of 8.7. Findings consistent with a neoplastic process. No enlarged or hypermetabolic mediastinal or  hilar lymph nodes to suggest metastatic disease. Stable calcified pretracheal lymph node. Extensive emphysematous changes and pulmonary scarring with interstitial lung  disease. No other pulmonary lesions or acute pulmonary findings. Advanced aortic atherosclerotic calcifications and three-vessel coronary artery calcifications. Small hiatal hernia. ABDOMEN/PELVIS No abnormal hypermetabolic activity within the liver, pancreas, adrenal glands, or spleen. No hypermetabolic lymph nodes in the abdomen or pelvis. SKELETON No focal hypermetabolic activity to suggest skeletal metastasis. IMPRESSION: 1. 3 cm peripheral right upper lobe lung mass with moderate hypermetabolism suggesting neoplasm. 2. No findings for mediastinal or hilar lymphadenopathy or metastatic disease. 3. Severe lung disease with emphysema and interstitial pulmonary fibrosis. 4. Advanced atherosclerotic calcifications involving the thoracic and abdominal aorta and branch vessels. Electronically Signed   By: Marijo Sanes M.D.   On: 02/09/2017 10:54   Ct Biopsy  Result Date: 02/25/2017 INDICATION: History of pulmonary fibrosis, now with indeterminate hypermetabolic right upper lobe pulmonary nodule. Please perform CT-guided biopsy for tissue diagnostic purposes. EXAM: 1. CT GUIDED RIGHT UPPER LOBE PULMONARY NODULE BIOPSY. 2. CT-GUIDED RIGHT-SIDED CHEST TUBE PLACEMENT. COMPARISON:  PET-CT - 02/09/2017; Chest CT - 01/27/2017 MEDICATIONS: None. ANESTHESIA/SEDATION: Fentanyl 25 mcg IV; Versed 0.5 mg IV Sedation time: 20 minutes; The patient was continuously monitored during the procedure by the interventional radiology nurse under my direct supervision. CONTRAST:  None COMPLICATIONS: SIR LEVEL C - Requires therapy, minor hospitalization (<48 hrs). Procedure complicated by development of a rapidly expanding right-sided pneumothorax requiring urgent chest tube placement. Fortunately, the patient remained hemodynamically stable prior to successful chest tube  placement. PROCEDURE: Informed consent was obtained from the patient following an explanation of the procedure, risks, benefits and alternatives. The patient understands,agrees and consents for the procedure. All questions were addressed. A time out was performed prior to the initiation of the procedure. The patient was positioned supine, slightly LPO on the CT table and a limited chest CT was performed for procedural planning demonstrating unchanged size and positioning of slightly spiculated hypermetabolic nodule within the subpleural aspect of the right upper lobe with dominant component measuring approximately 3.1 x 1.8 cm (image 20, series 2). The operative site was prepped and draped in the usual sterile fashion. Under sterile conditions and local anesthesia, a 17 gauge coaxial needle was advanced into the peripheral aspect of the nodule. Positioning was confirmed with intermittent CT fluoroscopy and followed by the acquisition of 2 core needle biopsies with an 18 gauge core needle biopsy device. The coaxial needle was removed and superficial hemostasis was achieved with manual compression. Immediate postprocedural chest CT demonstrated development of a moderate-sized pneumothorax which was found to enlarged with subsequent minimally delayed imaging. As such, decision was made to place a right-sided chest tube. The skin was anesthetized with 1% lidocaine. An 18 gauge trocar needle was advanced into the pleural space and air was aspirated. A short Amplatz wire was coiled within the pleural space. The track was dilated allowing placement of a 10 French percutaneous drainage catheter. The drainage catheter was connected to a three-way stopcock and approximately 300 cc of air was aspirated. Postprocedural imaging was obtained. The chest tube was connected to a pleural vac device and secured at the skin entrance site within interrupted suture. A Vaseline gauze dressing and StatLock device were placed. The patient  otherwise tolerated the procedure well and remained hemodynamically stable during the chest tube placement. IMPRESSION: 1. Technically successful CT guided core needle core biopsy of indeterminate hypermetabolic right upper lobe pulmonary nodule/mass. 2. Procedure complicated by development of a rapidly expanding right-sided pneumothorax requiring urgent chest tube placement. Fortunately, the patient remained hemodynamically stable during the chest tube placement, with resolution of  the shortness of breath following placement of the chest tube to the pleural vac device and wall suction. Electronically Signed   By: Sandi Mariscal M.D.   On: 02/25/2017 11:24   Dg Chest Port 1 View  Result Date: 02/27/2017 CLINICAL DATA:  Status post chest tube removal. EXAM: PORTABLE CHEST 1 VIEW COMPARISON:  Study obtained earlier in the day. FINDINGS: Right chest tube is been removed. No evident pneumothorax. There is underlying interstitial fibrosis. There is airspace consolidation in the periphery of the right mid lung. No new opacity. There is mild cardiomegaly with pulmonary vascularity within normal limits. No adenopathy. No evident bone lesions. IMPRESSION: No pneumothorax following chest tube removal. Airspace consolidation right lateral mid lung again noted. Underlying interstitial fibrosis. No new opacity. Stable cardiac prominence. Electronically Signed   By: Lowella Grip III M.D.   On: 02/27/2017 14:08   Dg Chest Port 1 View  Result Date: 02/27/2017 CLINICAL DATA:  66 year old male with right pneumothorax following CT-guided right upper lobe pulmonary nodule treated with CT-guided chest tube placement. EXAM: PORTABLE CHEST 1 VIEW COMPARISON:  02/26/2017 and earlier. FINDINGS: Portable AP semi upright view at 0610 hours. Lateral approach right pigtail chest tube remains in place. Resolved small volume right chest wall subcutaneous gas. No residual right pneumothorax identified. Underlying chronic lung disease with  coarse reticulonodular opacity right greater than left. Stable cardiac size and mediastinal contours. Visualized tracheal air column is within normal limits. No pleural effusion or worsening ventilation. IMPRESSION: 1. Stable right chest tube.  No residual pneumothorax. 2. Underlying interstitial lung disease. No new cardiopulmonary abnormality. Electronically Signed   By: Genevie Ann M.D.   On: 02/27/2017 07:59   Portable Chest 1 View  Result Date: 02/26/2017 CLINICAL DATA:  Follow-up pneumothorax EXAM: PORTABLE CHEST 1 VIEW COMPARISON:  02/25/2017 FINDINGS: Cardiac shadow is stable. Right-sided chest tube is noted. No pneumothorax is seen. Stable right upper lobe mass lesion is again noted. Diffuse fibrotic changes are noted throughout both lungs. No new focal abnormality is noted. IMPRESSION: No recurrent pneumothorax is noted. Stable changes are seen bilaterally. Electronically Signed   By: Inez Catalina M.D.   On: 02/26/2017 07:35   Dg Chest Port 1 View  Result Date: 02/25/2017 CLINICAL DATA:  CT-guided biopsy of the right lung with subsequent pneumothorax. Patient is undergone chest tube placement. EXAM: PORTABLE CHEST 1 VIEW COMPARISON:  CT scan of the chest of today's date performed during the biopsy. FINDINGS: There has been Re interval re-expansion of the right lung. No pneumothorax is evident currently. A small caliber pigtail chest tube is present along the lateral aspects of the right fifth and sixth ribs. There is interstitial prominence in the right mid lung. The background interstitial markings of both lungs are coarse though stable. The cardiac silhouette is enlarged. The pulmonary vascularity is normal. There is calcification in the wall of the aortic arch. IMPRESSION: Interval re-expansion of the right lung since chest tube placement. Electronically Signed   By: David  Martinique M.D.   On: 02/25/2017 12:24   Ct Perc Pleural Drain W/indwell Cath W/img Guide  Result Date: 02/25/2017 INDICATION:  History of pulmonary fibrosis, now with indeterminate hypermetabolic right upper lobe pulmonary nodule. Please perform CT-guided biopsy for tissue diagnostic purposes. EXAM: 1. CT GUIDED RIGHT UPPER LOBE PULMONARY NODULE BIOPSY. 2. CT-GUIDED RIGHT-SIDED CHEST TUBE PLACEMENT. COMPARISON:  PET-CT - 02/09/2017; Chest CT - 01/27/2017 MEDICATIONS: None. ANESTHESIA/SEDATION: Fentanyl 25 mcg IV; Versed 0.5 mg IV Sedation time: 20 minutes; The patient  was continuously monitored during the procedure by the interventional radiology nurse under my direct supervision. CONTRAST:  None COMPLICATIONS: SIR LEVEL C - Requires therapy, minor hospitalization (<48 hrs). Procedure complicated by development of a rapidly expanding right-sided pneumothorax requiring urgent chest tube placement. Fortunately, the patient remained hemodynamically stable prior to successful chest tube placement. PROCEDURE: Informed consent was obtained from the patient following an explanation of the procedure, risks, benefits and alternatives. The patient understands,agrees and consents for the procedure. All questions were addressed. A time out was performed prior to the initiation of the procedure. The patient was positioned supine, slightly LPO on the CT table and a limited chest CT was performed for procedural planning demonstrating unchanged size and positioning of slightly spiculated hypermetabolic nodule within the subpleural aspect of the right upper lobe with dominant component measuring approximately 3.1 x 1.8 cm (image 20, series 2). The operative site was prepped and draped in the usual sterile fashion. Under sterile conditions and local anesthesia, a 17 gauge coaxial needle was advanced into the peripheral aspect of the nodule. Positioning was confirmed with intermittent CT fluoroscopy and followed by the acquisition of 2 core needle biopsies with an 18 gauge core needle biopsy device. The coaxial needle was removed and superficial hemostasis  was achieved with manual compression. Immediate postprocedural chest CT demonstrated development of a moderate-sized pneumothorax which was found to enlarged with subsequent minimally delayed imaging. As such, decision was made to place a right-sided chest tube. The skin was anesthetized with 1% lidocaine. An 18 gauge trocar needle was advanced into the pleural space and air was aspirated. A short Amplatz wire was coiled within the pleural space. The track was dilated allowing placement of a 10 French percutaneous drainage catheter. The drainage catheter was connected to a three-way stopcock and approximately 300 cc of air was aspirated. Postprocedural imaging was obtained. The chest tube was connected to a pleural vac device and secured at the skin entrance site within interrupted suture. A Vaseline gauze dressing and StatLock device were placed. The patient otherwise tolerated the procedure well and remained hemodynamically stable during the chest tube placement. IMPRESSION: 1. Technically successful CT guided core needle core biopsy of indeterminate hypermetabolic right upper lobe pulmonary nodule/mass. 2. Procedure complicated by development of a rapidly expanding right-sided pneumothorax requiring urgent chest tube placement. Fortunately, the patient remained hemodynamically stable during the chest tube placement, with resolution of the shortness of breath following placement of the chest tube to the pleural vac device and wall suction. Electronically Signed   By: Sandi Mariscal M.D.   On: 02/25/2017 11:24     I have independently reviewed the above radiologic studies.  Recent Lab Findings: Lab Results  Component Value Date   WBC 7.4 02/27/2017   HGB 15.4 02/27/2017   HCT 48.3 02/27/2017   PLT 127 (L) 02/27/2017   GLUCOSE 175 (H) 02/26/2017   CHOL 110 05/29/2015   TRIG 96.0 05/29/2015   HDL 38.20 (L) 05/29/2015   LDLCALC 53 05/29/2015   ALT 17 09/17/2016   AST 17 09/17/2016   NA 138 02/26/2017     K 3.3 (L) 02/26/2017   CL 101 02/26/2017   CREATININE 1.29 (H) 02/26/2017   BUN 15 02/26/2017   CO2 25 02/26/2017   TSH 0.84 11/15/2015   INR 1.01 02/25/2017   HGBA1C 7.1 12/07/2016    LV EF: 60% -   65%  ------------------------------------------------------------------- Indications:      Atrial Fibrillation (I48.91).  ------------------------------------------------------------------- History:   PMH:  CKD, Sleep apnea, OSA.  Coronary artery disease. Chronic obstructive pulmonary disease.  Risk factors:  Current tobacco use.  ------------------------------------------------------------------- Study Conclusions  - Left ventricle: The cavity size was normal. There was severe   concentric hypertrophy. Systolic function was normal. The   estimated ejection fraction was in the range of 60% to 65%. Wall   motion was normal; there were no regional wall motion   abnormalities. The study was not technically sufficient to allow   evaluation of LV diastolic dysfunction due to atrial   fibrillation. - Aortic valve: Trileaflet; normal thickness leaflets. There was   moderate regurgitation. - Left atrium: The atrium was mildly dilated. - Right ventricle: Systolic function was normal. - Right atrium: The atrium was normal in size. - Tricuspid valve: There was mild regurgitation. - Pulmonary arteries: Systolic pressure was within the normal   range. - Inferior vena cava: The vessel was normal in size. The   respirophasic diameter changes were in the normal range (= 50%),   consistent with normal central venous pressure. - Pericardium, extracardiac: There was no pericardial effusion.  ------------------------------------------------------------------- Study data:  Comparison was made to the study of 02/26/2016.  Study status:  Routine.  Procedure:  The patient reported no pain pre or post test. Transthoracic echocardiography. Image quality was adequate.          Transthoracic  echocardiography.  M-mode, limited 2D, limited spectral Doppler, and color Doppler.  Birthdate: Patient birthdate: [birth year].  Age:  Patient is 66 yr old.  Sex: Gender: male.    BMI: 38.2 kg/m^2.  Blood pressure:     100/60 Patient status:  Outpatient.  Study date:  Study date: 11/24/2016. Study time: 10:26 AM.  Location:  [facility]  -------------------------------------------------------------------  ------------------------------------------------------------------- Left ventricle:  The cavity size was normal. There was severe concentric hypertrophy. Systolic function was normal. The estimated ejection fraction was in the range of 60% to 65%. Wall motion was normal; there were no regional wall motion abnormalities. The study was not technically sufficient to allow evaluation of LV diastolic dysfunction due to atrial fibrillation.  ------------------------------------------------------------------- Aortic valve:   Trileaflet; normal thickness leaflets. Mobility was not restricted.  Doppler:  Transvalvular velocity was within the normal range. There was no stenosis. There was moderate regurgitation.  ------------------------------------------------------------------- Aorta:  Aortic root: The aortic root was normal in size.  ------------------------------------------------------------------- Mitral valve:   Structurally normal valve.   Mobility was not restricted.  Doppler:  Transvalvular velocity was within the normal range. There was no evidence for stenosis. There was no regurgitation.    Peak gradient (D): 3 mm Hg.  ------------------------------------------------------------------- Left atrium:  The atrium was mildly dilated.  ------------------------------------------------------------------- Right ventricle:  The cavity size was normal. Wall thickness was normal. Systolic function was  normal.  ------------------------------------------------------------------- Pulmonic valve:    Doppler:  Transvalvular velocity was within the normal range. There was no evidence for stenosis.  ------------------------------------------------------------------- Tricuspid valve:   Structurally normal valve.    Doppler: Transvalvular velocity was within the normal range. There was mild regurgitation.  ------------------------------------------------------------------- Pulmonary artery:   The main pulmonary artery was normal-sized. Systolic pressure was within the normal range.  ------------------------------------------------------------------- Right atrium:  The atrium was normal in size.  ------------------------------------------------------------------- Pericardium:  There was no pericardial effusion.  ------------------------------------------------------------------- Systemic veins: Inferior vena cava: The vessel was normal in size. The respirophasic diameter changes were in the normal range (= 50%), consistent with normal central venous pressure. Diameter: 20 mm.  ------------------------------------------------------------------- Measurements  IVC                                        Value        Reference  ID                                         20    mm     ---------    Left ventricle                             Value        Reference  LV ID, ED, PLAX chordal          (L)       37.2  mm     43 - 52  LV ID, ES, PLAX chordal                    23.5  mm     23 - 38  LV fx shortening, PLAX chordal             37    %      >=29  LV PW thickness, ED                        17.1  mm     ---------  IVS/LV PW ratio, ED                        0.95         <=1.3  LV e&', lateral                             16.1  cm/s   ---------  LV E/e&', lateral                           5.3          ---------  LV e&', medial                              6.36  cm/s   ---------   LV E/e&', medial                            13.43        ---------  LV e&', average                             11.23 cm/s   ---------  LV E/e&', average                           7.6          ---------    Ventricular septum                         Value        Reference  IVS thickness, ED  16.3  mm     ---------    LVOT                                       Value        Reference  LVOT peak velocity, S                      98.5  cm/s   ---------  LVOT mean velocity, S                      65.2  cm/s   ---------  LVOT VTI, S                                16.5  cm     ---------    Aortic valve                               Value        Reference  Aortic regurg pressure half-time           447   ms     ---------    Aorta                                      Value        Reference  Aortic root ID, ED                         39    mm     ---------    Left atrium                                Value        Reference  LA ID, A-P, ES                             43    mm     ---------  LA ID/bsa, A-P                             1.69  cm/m^2 <=2.2  LA volume, S                               61    ml     ---------  LA volume/bsa, S                           24    ml/m^2 ---------  LA volume, ES, 1-p A4C                     63    ml     ---------  LA volume/bsa, ES, 1-p A4C                 24.8  ml/m^2 ---------  LA volume, ES, 1-p A2C  58    ml     ---------  LA volume/bsa, ES, 1-p A2C                 22.8  ml/m^2 ---------    Mitral valve                               Value        Reference  Mitral E-wave peak velocity                85.4  cm/s   ---------  Mitral deceleration time         (H)       236   ms     150 - 230  Mitral peak gradient, D                    3     mm Hg  ---------    Right ventricle                            Value        Reference  RV s&', lateral, S                          12.7  cm/s   ---------  Legend: (L)  and  (H)   mark values outside specified reference range.  ------------------------------------------------------------------- Prepared and Electronically Authenticated by  Ena Dawley, M.D. 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 Chronic atrial fibrillation-on long-term anticoagulation   I informed the patient of the diagnosis of squamous cell carcinoma of the right upper lobe and discuss with him the risks and options of surgical treatment versus radiotherapy. We'll make arrangements for him to see Dr. Earlie Server and radiation oncology in the near future.   I  spent 45 minutes counseling the patient face to face and 50% or more the  time was spent in counseling and coordination of care. The total time spent in the appointment was 60 minutes.  Neil Isaac MD      [address] [place] [address] Office [phone]   Beeper [phone]  03/04/2017 4:09 PM

## 2017-03-04 NOTE — Progress Notes (Signed)
Oncology Nurse Navigator Documentation  Oncology Nurse Navigator Flowsheets 03/04/2017  Navigator Location [facility]  Navigator Encounter Type Other/I received a call from [facility].  Patient needs to be seen by Rad Onc, referral made. I will notify Rad Onc scheduling team to help expedite appt.   Treatment Phase Pre-Tx/Tx Discussion  Barriers/Navigation Needs Coordination of Care  Interventions Coordination of Care  Coordination of Care Other  Acuity Level 2  Acuity Level 2 Assistance expediting appointments  Time Spent with Patient 30

## 2017-03-05 ENCOUNTER — Encounter: Payer: Self-pay | Admitting: Radiation Oncology

## 2017-03-08 ENCOUNTER — Other Ambulatory Visit: Payer: Self-pay

## 2017-03-08 ENCOUNTER — Other Ambulatory Visit: Payer: Self-pay | Admitting: Medical Oncology

## 2017-03-08 DIAGNOSIS — C3490 Malignant neoplasm of unspecified part of unspecified bronchus or lung: Secondary | ICD-10-CM

## 2017-03-08 DIAGNOSIS — R918 Other nonspecific abnormal finding of lung field: Secondary | ICD-10-CM

## 2017-03-09 ENCOUNTER — Other Ambulatory Visit (INDEPENDENT_AMBULATORY_CARE_PROVIDER_SITE_OTHER): Payer: Medicare Other

## 2017-03-09 ENCOUNTER — Other Ambulatory Visit (HOSPITAL_BASED_OUTPATIENT_CLINIC_OR_DEPARTMENT_OTHER): Payer: Medicare Other

## 2017-03-09 ENCOUNTER — Telehealth: Payer: Self-pay | Admitting: Internal Medicine

## 2017-03-09 ENCOUNTER — Ambulatory Visit (HOSPITAL_BASED_OUTPATIENT_CLINIC_OR_DEPARTMENT_OTHER): Payer: Medicare Other | Admitting: Internal Medicine

## 2017-03-09 ENCOUNTER — Encounter: Payer: Self-pay | Admitting: Internal Medicine

## 2017-03-09 VITALS — BP 99/59 | HR 60 | Temp 98.6°F | Resp 22 | Ht 71.0 in | Wt 272.2 lb

## 2017-03-09 DIAGNOSIS — Z720 Tobacco use: Secondary | ICD-10-CM

## 2017-03-09 DIAGNOSIS — E119 Type 2 diabetes mellitus without complications: Secondary | ICD-10-CM

## 2017-03-09 DIAGNOSIS — E1165 Type 2 diabetes mellitus with hyperglycemia: Secondary | ICD-10-CM

## 2017-03-09 DIAGNOSIS — E1159 Type 2 diabetes mellitus with other circulatory complications: Secondary | ICD-10-CM

## 2017-03-09 DIAGNOSIS — I10 Essential (primary) hypertension: Secondary | ICD-10-CM | POA: Diagnosis not present

## 2017-03-09 DIAGNOSIS — Z794 Long term (current) use of insulin: Secondary | ICD-10-CM | POA: Diagnosis not present

## 2017-03-09 DIAGNOSIS — F1021 Alcohol dependence, in remission: Secondary | ICD-10-CM

## 2017-03-09 DIAGNOSIS — C3411 Malignant neoplasm of upper lobe, right bronchus or lung: Secondary | ICD-10-CM | POA: Diagnosis not present

## 2017-03-09 DIAGNOSIS — J449 Chronic obstructive pulmonary disease, unspecified: Secondary | ICD-10-CM | POA: Diagnosis not present

## 2017-03-09 DIAGNOSIS — R918 Other nonspecific abnormal finding of lung field: Secondary | ICD-10-CM

## 2017-03-09 DIAGNOSIS — C3491 Malignant neoplasm of unspecified part of right bronchus or lung: Secondary | ICD-10-CM

## 2017-03-09 DIAGNOSIS — F17200 Nicotine dependence, unspecified, uncomplicated: Secondary | ICD-10-CM

## 2017-03-09 DIAGNOSIS — Z801 Family history of malignant neoplasm of trachea, bronchus and lung: Secondary | ICD-10-CM

## 2017-03-09 LAB — LIPID PANEL
CHOLESTEROL: 99 mg/dL (ref 0–200)
HDL: 31.2 mg/dL — ABNORMAL LOW (ref >39.00)
LDL CALC: 48 mg/dL (ref 0–99)
NonHDL: 67.96
TRIGLYCERIDES: 100 mg/dL (ref 0.0–149.0)
Total CHOL/HDL Ratio: 3
VLDL: 20 mg/dL (ref 0.0–40.0)

## 2017-03-09 LAB — COMPREHENSIVE METABOLIC PANEL
ALBUMIN: 4.1 g/dL (ref 3.5–5.2)
ALK PHOS: 52 U/L (ref 40–150)
ALT: 13 U/L (ref 0–53)
ALT: 15 U/L (ref 0–55)
AST: 13 U/L (ref 0–37)
AST: 14 U/L (ref 5–34)
Albumin: 3.6 g/dL (ref 3.5–5.0)
Alkaline Phosphatase: 46 U/L (ref 39–117)
Anion Gap: 11 mEq/L (ref 3–11)
BUN: 17 mg/dL (ref 6–23)
BUN: 17.7 mg/dL (ref 7.0–26.0)
CHLORIDE: 104 meq/L (ref 96–112)
CO2: 26 meq/L (ref 19–32)
CO2: 27 meq/L (ref 22–29)
Calcium: 9.5 mg/dL (ref 8.4–10.5)
Calcium: 9.6 mg/dL (ref 8.4–10.4)
Chloride: 105 mEq/L (ref 98–109)
Creatinine, Ser: 1.41 mg/dL (ref 0.40–1.50)
Creatinine: 1.4 mg/dL — ABNORMAL HIGH (ref 0.7–1.3)
EGFR: 58 mL/min/{1.73_m2} — AB (ref >90)
GFR: 64.5 mL/min (ref >60.00)
GLUCOSE: 101 mg/dL (ref 70–140)
Glucose, Bld: 136 mg/dL — ABNORMAL HIGH (ref 70–99)
POTASSIUM: 3.8 meq/L (ref 3.5–5.1)
POTASSIUM: 3.7 meq/L (ref 3.5–5.1)
SODIUM: 139 meq/L (ref 135–145)
SODIUM: 143 meq/L (ref 136–145)
Total Bilirubin: 0.3 mg/dL (ref 0.2–1.2)
Total Bilirubin: 0.32 mg/dL (ref 0.20–1.20)
Total Protein: 7.5 g/dL (ref 6.0–8.3)
Total Protein: 7.6 g/dL (ref 6.4–8.3)

## 2017-03-09 LAB — CBC WITH DIFFERENTIAL/PLATELET
BASO%: 0.8 % (ref 0.0–2.0)
BASOS ABS: 0.1 10*3/uL (ref 0.0–0.1)
EOS ABS: 0.4 10*3/uL (ref 0.0–0.5)
EOS%: 3.4 % (ref 0.0–7.0)
HCT: 50.7 % — ABNORMAL HIGH (ref 38.4–49.9)
HGB: 16.7 g/dL (ref 13.0–17.1)
LYMPH%: 19.2 % (ref 14.0–49.0)
MCH: 30.3 pg (ref 27.2–33.4)
MCHC: 32.9 g/dL (ref 32.0–36.0)
MCV: 92.2 fL (ref 79.3–98.0)
MONO#: 1.3 10*3/uL — ABNORMAL HIGH (ref 0.1–0.9)
MONO%: 12.4 % (ref 0.0–14.0)
NEUT#: 6.5 10*3/uL (ref 1.5–6.5)
NEUT%: 64.2 % (ref 39.0–75.0)
Platelets: 160 10*3/uL (ref 140–400)
RBC: 5.49 10*6/uL (ref 4.20–5.82)
RDW: 14 % (ref 11.0–14.6)
WBC: 10.2 10*3/uL (ref 4.0–10.3)
lymph#: 2 10*3/uL (ref 0.9–3.3)

## 2017-03-09 LAB — HEMOGLOBIN A1C: Hgb A1c MFr Bld: 7.6 % — ABNORMAL HIGH (ref 4.6–6.5)

## 2017-03-09 NOTE — Progress Notes (Signed)
Neil [phone]   [phone]  CONSULT NOTE  REFERRING PHYSICIAN: [name]  REASON FOR CONSULTATION:  66 years old white male recently diagnosed with lung cancer.  HPI Neil HEGNER Sr. is a 66 y.o. male with past medical history significant for multiple medical problems including history of pulmonary fibrosis with interstitial lung disease, hypertension, atrial flutter, dyslipidemia, COPD, GERD, obstructive sleep apnea, diabetes mellitus and long history of smoking. The patient was followed by [name] for his COPD and pulmonary fibrosis. CT scan of the chest on 01/27/2017 showed the CA 0.0 x 1.6 x 3.4 cm pleural-based mass in the periphery of the right upper lobe which has Macrobid ablated and the spiculated margins making blood contact with the overlying pleura and retracting the adjacent lung parenchyma. This was followed by a PET scan on 02/09/2017 and it showed 3.0 cm peripheral right upper lobe lung mass with moderate hypermetabolism suggesting neoplasm. There is no findings for mediastinal or hilar lymphadenopathy or distant metastatic disease. On 02/25/2017 the patient underwent CT-guided core biopsy of the right upper lobe pulmonary mass. This was complicated by pneumothorax and the patient had CT-guided right chest tube placement which was later removed. The final pathology ([id number]) was consistent with squamous cell carcinoma. The malignant cells were positive for cytokeratin 5/6 and p63 and negative for Napsin-A and TTF-1.  The patient is not a good surgical candidate for resection because of his poor pulmonary function.  [name] referred the patient to me today for evaluation and recommendation regarding treatment of his condition. When seen today the patient continues to have shortness breath and cough productive of yellowish white sputum with no hemoptysis or chest pain. He denied having any weight loss or night sweats. He  has no nausea but has occasional vomiting with no diarrhea or constipation. He denied having any headache or visual changes. He has no fever or chills. Family history significant for mother died from a stroke at age 46 and father from lung cancer at age 60. The patient is single and has 5 children. 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 He has a history of smoking 1-2 ppd for around 51 years and unfortunately he continues to smoke. He has a history of alcohol abuse in the past but not recently and no history of drug abuse.  HPI  Past Medical History:  Diagnosis Date  . Adenomatous colon polyp   . Aortic insufficiency    Echo 3/18: Severe concentric LVH, EF 60-65, normal wall motion, moderate AI, mild LAE, mild TR  . Arthritis    left hip replacement  . Atrial flutter ([place])    onset  2011. s/p EPS/RFA 12/2011  . Cataract   . Chronic bronchitis   . Contact dermatitis and other eczema due to plants (except food)   . COPD (chronic obstructive pulmonary disease) ([place])   . GERD (gastroesophageal reflux disease)   . Hyperlipidemia   . Hypertension   . LV dysfunction    EF 45-50% 12/2011  . OSA (obstructive sleep apnea) 10/13/2016   Mild with AHI 9.7/hr with significant oxygen desaturations as low as 76% now on CPAP  . Other peripheral vascular disease(443.89)    bilateral lower extremity  . Tobacco use disorder    dependent  . Transient global amnesia   . Tuberculosis   . Type II diabetes mellitus ([place])     Past Surgical History:  Procedure Laterality Date  . A FLUTTER ABLATION N/A 12/28/2011   Procedure: ABLATION A FLUTTER;  Surgeon: Evans Lance, MD;  Location: [facility] CATH LAB;  Service: Cardiovascular;  Laterality: N/A;  . CARDIAC CATHETERIZATION N/A 07/10/2015   Procedure: Left Heart Cath and Coronary Angiography;  Surgeon: Sherren Mocha, MD;  Location: [facility] CV LAB;  Service: Cardiovascular;  Laterality: N/A;  . CARDIAC ELECTROPHYSIOLOGY  MAPPING AND ABLATION  03/2010  . CARDIOVERSION N/A 07/13/2016   Procedure: CARDIOVERSION;  Surgeon: Skeet Latch, MD;  Location: [facility];  Service: Cardiovascular;  Laterality: N/A;  . COLONOSCOPY    . colonoscopy with polypectomy  2006 & 2011   Dr [name]  . CYSTOSCOPY  11/2007   Dr Jeffie Pollock  . PARTIAL HIP ARTHROPLASTY Right 01/2000   "Right; replaced ball & stem"  . PARTIAL HIP ARTHROPLASTY Left   . POLYPECTOMY    . TEE WITHOUT CARDIOVERSION  12/28/2011   Procedure: TRANSESOPHAGEAL ECHOCARDIOGRAM (TEE);  Surgeon: Larey Dresser, MD;  Location: [facility] ENDOSCOPY;  Service: Cardiovascular;  Laterality: N/A;    Family History  Problem Relation Age of Onset  . Stroke Mother 34  . Hypertension Mother   . Diabetes Mother   . Diabetes Paternal Grandmother   . Lung cancer Father        smoker  . Diabetes Brother   . Heart attack Brother 88  . Diabetes Brother   . Hepatitis C Brother   . Throat cancer Paternal Uncle        1/2 uncle  . Heart attack Brother 15  . Crohn's disease Son   . Colon cancer Neg Hx   . Esophageal cancer Neg Hx   . Rectal cancer Neg Hx   . Stomach cancer Neg Hx     Social History Social History  Substance Use Topics  . Smoking status: Current Every Day Smoker    Packs/day: 1.00    Years: 44.00    Types: Cigarettes  . Smokeless tobacco: Never Used     Comment: started @ age 21, up to 2 ppd;1.25-1.5 ppd as of 11/23/13  . Alcohol use No     Comment:  11/23/13 "2-3 drinks per year"    Allergies  Allergen Reactions  . Niacin Nausea Only    REACTION: upset stomach    Current Outpatient Prescriptions  Medication Sig Dispense Refill  . acetaminophen (TYLENOL) 650 MG CR tablet Take 650 mg by mouth every 8 (eight) hours as needed for pain.    . cilostazol (PLETAL) 100 MG tablet take 1 tablet by mouth twice a day 180 tablet 1  . clobetasol (OLUX) 0.05 % topical foam Apply 1 application topically 2 (two) times daily as needed.   0  . collagenase (SANTYL)  ointment Apply 1 application topically daily as needed.     . diltiazem (CARTIA XT) 180 MG 24 hr capsule Take 1 capsule (180 mg total) by mouth 2 (two) times daily. 180 capsule 3  . etanercept (ENBREL SURECLICK) 50 MG/ML injection Inject 50 mg into the skin 2 (two) times a week. Inject on Sundays & Wednesdays    . Flaxseed, Linseed, (FLAX SEED OIL) 1000 MG CAPS Take 1 capsule by mouth 2 (two) times daily.     . Fluticasone-Salmeterol (ADVAIR) 100-50 MCG/DOSE AEPB Inhale 1 puff into the lungs 2 (two) times daily. 180 each 1  . furosemide (LASIX) 40 MG tablet Take 2 tablets (80 mg total) by mouth 2 (two) times daily. 120 tablet 11  .  insulin detemir (LEVEMIR) 100 UNIT/ML injection Inject 26 Units into the skin at bedtime.    [name] losartan (COZAAR) 25 MG tablet Take 1 tablet (25 mg total) by mouth daily. 90 tablet 3  . metFORMIN (GLUCOPHAGE) 1000 MG tablet Take 1,000 mg by mouth 2 (two) times daily with a meal.    . metolazone (ZAROXOLYN) 2.5 MG tablet Take 1 tablet (2.5 mg total) by mouth as directed. TAKE 1 TABLET TWICE A WEEK 30 tablet 3  . metoprolol (LOPRESSOR) 100 MG tablet Take 100-150 mg by mouth See admin instructions. 1 tablet every morning And take 1 and 1/2 tablets by mouth every evening    . Multiple Vitamins-Minerals (CENTRUM ADULTS) TABS Take 1 tablet by mouth daily.    . mupirocin ointment (BACTROBAN) 2 % Apply 1 application topically 2 (two) times daily as needed (affected areas).     . Omega-3 Fatty Acids (FISH OIL) 1000 MG CAPS Take 2 capsules by mouth 2 (two) times daily.     . potassium chloride SA (K-DUR,KLOR-CON) 20 MEQ tablet Take 1 tablet (20 mEq total) by mouth 2 (two) times daily. 60 tablet 11  . rivaroxaban (XARELTO) 20 MG TABS tablet Take 20 mg by mouth every morning.     . rosuvastatin (CRESTOR) 10 MG tablet Take 1 tablet (10 mg total) by mouth daily. 90 tablet 3  . tamsulosin (FLOMAX) 0.4 MG CAPS capsule Take 0.4 mg by mouth daily.     [name] triamcinolone cream (KENALOG) 0.1 %  Apply 1 application topically 2 (two) times daily as needed (affected area).     [name] VICTOZA 18 MG/3ML SOPN INJECT 0.3ML (=1.8MG )      SUBCUTANEOUSLY DAILY 27 mL 1   No current facility-administered medications for this visit.     Review of Systems  Constitutional: negative Eyes: negative Ears, nose, mouth, throat, and face: negative Respiratory: positive for cough and dyspnea on exertion Cardiovascular: negative Gastrointestinal: negative Genitourinary:negative Integument/breast: negative Hematologic/lymphatic: negative Musculoskeletal:negative Neurological: negative Behavioral/Psych: negative Endocrine: negative Allergic/Immunologic: negative  Physical Exam  OAC:ZYSAY, healthy, no distress, well nourished and well developed SKIN: skin color, texture, turgor are normal, no rashes or significant lesions HEAD: Normocephalic, No masses, lesions, tenderness or abnormalities EYES: normal, PERRLA, Conjunctiva are pink and non-injected EARS: External ears normal, Canals clear OROPHARYNX:no exudate, no erythema and lips, buccal mucosa, and tongue normal  NECK: supple, no adenopathy, no JVD LYMPH:  no palpable lymphadenopathy, no hepatosplenomegaly LUNGS: expiratory wheezes bilaterally, scattered rales bilaterally HEART: regular rate & rhythm, no murmurs and no gallops ABDOMEN:abdomen soft, non-tender, normal bowel sounds and no masses or organomegaly BACK: Back symmetric, no curvature., No CVA tenderness, Range of motion is normal EXTREMITIES:no joint deformities, effusion, or inflammation, no edema, no skin discoloration  NEURO: alert & oriented x 3 with fluent speech, no focal motor/sensory deficits  PERFORMANCE STATUS: ECOG 1  LABORATORY DATA: Lab Results  Component Value Date   WBC 10.2 03/09/2017   HGB 16.7 03/09/2017   HCT 50.7 (H) 03/09/2017   MCV 92.2 03/09/2017   PLT 160 03/09/2017      Chemistry      Component Value Date/Time   NA 139 03/09/2017 0851   NA 139  12/21/2016 1018   K 3.8 03/09/2017 0851   CL 104 03/09/2017 0851   CO2 26 03/09/2017 0851   BUN 17 03/09/2017 0851   BUN 15 12/21/2016 1018   CREATININE 1.41 03/09/2017 0851   CREATININE 1.29 (H) 07/06/2016 1128   GLU 149 07/07/2013 1325  Component Value Date/Time   CALCIUM 9.5 03/09/2017 0851   ALKPHOS 46 03/09/2017 0851   AST 13 03/09/2017 0851   ALT 13 03/09/2017 0851   BILITOT 0.3 03/09/2017 0851       RADIOGRAPHIC STUDIES: Nm Pet Image Initial (pi) Skull Base To Thigh  Result Date: 02/09/2017 CLINICAL DATA:  Initial treatment strategy for right lung lesion. EXAM: NUCLEAR MEDICINE PET SKULL BASE TO THIGH TECHNIQUE: 13.57 mCi F-18 FDG was injected intravenously. Full-ring PET imaging was performed from the skull base to thigh after the radiotracer. CT data was obtained and used for attenuation correction and anatomic localization. FASTING BLOOD GLUCOSE:  Value: 145 mg/dl COMPARISON:  Chest CT 01/27/2017 FINDINGS: NECK No hypermetabolic lymph nodes in the neck. CHEST The spiculated irregular 3 cm right upper lobe lung lesion demonstrates hypermetabolism with SUV max of 8.7. Findings consistent with a neoplastic process. No enlarged or hypermetabolic mediastinal or hilar lymph nodes to suggest metastatic disease. Stable calcified pretracheal lymph node. Extensive emphysematous changes and pulmonary scarring with interstitial lung disease. No other pulmonary lesions or acute pulmonary findings. Advanced aortic atherosclerotic calcifications and three-vessel coronary artery calcifications. Small hiatal hernia. ABDOMEN/PELVIS No abnormal hypermetabolic activity within the liver, pancreas, adrenal glands, or spleen. No hypermetabolic lymph nodes in the abdomen or pelvis. SKELETON No focal hypermetabolic activity to suggest skeletal metastasis. IMPRESSION: 1. 3 cm peripheral right upper lobe lung mass with moderate hypermetabolism suggesting neoplasm. 2. No findings for mediastinal or hilar  lymphadenopathy or metastatic disease. 3. Severe lung disease with emphysema and interstitial pulmonary fibrosis. 4. Advanced atherosclerotic calcifications involving the thoracic and abdominal aorta and branch vessels. Electronically Signed   By: Marijo Sanes M.D.   On: 02/09/2017 10:54   Ct Biopsy  Result Date: 02/25/2017 INDICATION: History of pulmonary fibrosis, now with indeterminate hypermetabolic right upper lobe pulmonary nodule. Please perform CT-guided biopsy for tissue diagnostic purposes. EXAM: 1. CT GUIDED RIGHT UPPER LOBE PULMONARY NODULE BIOPSY. 2. CT-GUIDED RIGHT-SIDED CHEST TUBE PLACEMENT. COMPARISON:  PET-CT - 02/09/2017; Chest CT - 01/27/2017 MEDICATIONS: None. ANESTHESIA/SEDATION: Fentanyl 25 mcg IV; Versed 0.5 mg IV Sedation time: 20 minutes; The patient was continuously monitored during the procedure by the interventional radiology nurse under my direct supervision. CONTRAST:  None COMPLICATIONS: SIR LEVEL C - Requires therapy, minor hospitalization (<48 hrs). Procedure complicated by development of a rapidly expanding right-sided pneumothorax requiring urgent chest tube placement. Fortunately, the patient remained hemodynamically stable prior to successful chest tube placement. PROCEDURE: Informed consent was obtained from the patient following an explanation of the procedure, risks, benefits and alternatives. The patient understands,agrees and consents for the procedure. All questions were addressed. A time out was performed prior to the initiation of the procedure. The patient was positioned supine, slightly LPO on the CT table and a limited chest CT was performed for procedural planning demonstrating unchanged size and positioning of slightly spiculated hypermetabolic nodule within the subpleural aspect of the right upper lobe with dominant component measuring approximately 3.1 x 1.8 cm (image 20, series 2). The operative site was prepped and draped in the usual sterile fashion. Under  sterile conditions and local anesthesia, a 17 gauge coaxial needle was advanced into the peripheral aspect of the nodule. Positioning was confirmed with intermittent CT fluoroscopy and followed by the acquisition of 2 core needle biopsies with an 18 gauge core needle biopsy device. The coaxial needle was removed and superficial hemostasis was achieved with manual compression. Immediate postprocedural chest CT demonstrated development of a moderate-sized pneumothorax which was  found to enlarged with subsequent minimally delayed imaging. As such, decision was made to place a right-sided chest tube. The skin was anesthetized with 1% lidocaine. An 18 gauge trocar needle was advanced into the pleural space and air was aspirated. A short Amplatz wire was coiled within the pleural space. The track was dilated allowing placement of a 10 French percutaneous drainage catheter. The drainage catheter was connected to a three-way stopcock and approximately 300 cc of air was aspirated. Postprocedural imaging was obtained. The chest tube was connected to a pleural vac device and secured at the skin entrance site within interrupted suture. A Vaseline gauze dressing and StatLock device were placed. The patient otherwise tolerated the procedure well and remained hemodynamically stable during the chest tube placement. IMPRESSION: 1. Technically successful CT guided core needle core biopsy of indeterminate hypermetabolic right upper lobe pulmonary nodule/mass. 2. Procedure complicated by development of a rapidly expanding right-sided pneumothorax requiring urgent chest tube placement. Fortunately, the patient remained hemodynamically stable during the chest tube placement, with resolution of the shortness of breath following placement of the chest tube to the pleural vac device and wall suction. Electronically Signed   By: Sandi Mariscal M.D.   On: 02/25/2017 11:24   Dg Chest Port 1 View  Result Date: 02/27/2017 CLINICAL DATA:  Status  post chest tube removal. EXAM: PORTABLE CHEST 1 VIEW COMPARISON:  Study obtained earlier in the day. FINDINGS: Right chest tube is been removed. No evident pneumothorax. There is underlying interstitial fibrosis. There is airspace consolidation in the periphery of the right mid lung. No new opacity. There is mild cardiomegaly with pulmonary vascularity within normal limits. No adenopathy. No evident bone lesions. IMPRESSION: No pneumothorax following chest tube removal. Airspace consolidation right lateral mid lung again noted. Underlying interstitial fibrosis. No new opacity. Stable cardiac prominence. Electronically Signed   By: Lowella Grip III M.D.   On: 02/27/2017 14:08   Dg Chest Port 1 View  Result Date: 02/27/2017 CLINICAL DATA:  66 year old male with right pneumothorax following CT-guided right upper lobe pulmonary nodule treated with CT-guided chest tube placement. EXAM: PORTABLE CHEST 1 VIEW COMPARISON:  02/26/2017 and earlier. FINDINGS: Portable AP semi upright view at 0610 hours. Lateral approach right pigtail chest tube remains in place. Resolved small volume right chest wall subcutaneous gas. No residual right pneumothorax identified. Underlying chronic lung disease with coarse reticulonodular opacity right greater than left. Stable cardiac size and mediastinal contours. Visualized tracheal air column is within normal limits. No pleural effusion or worsening ventilation. IMPRESSION: 1. Stable right chest tube.  No residual pneumothorax. 2. Underlying interstitial lung disease. No new cardiopulmonary abnormality. Electronically Signed   By: Genevie Ann M.D.   On: 02/27/2017 07:59   Portable Chest 1 View  Result Date: 02/26/2017 CLINICAL DATA:  Follow-up pneumothorax EXAM: PORTABLE CHEST 1 VIEW COMPARISON:  02/25/2017 FINDINGS: Cardiac shadow is stable. Right-sided chest tube is noted. No pneumothorax is seen. Stable right upper lobe mass lesion is again noted. Diffuse fibrotic changes are noted  throughout both lungs. No new focal abnormality is noted. IMPRESSION: No recurrent pneumothorax is noted. Stable changes are seen bilaterally. Electronically Signed   By: Inez Catalina M.D.   On: 02/26/2017 07:35   Dg Chest Port 1 View  Result Date: 02/25/2017 CLINICAL DATA:  CT-guided biopsy of the right lung with subsequent pneumothorax. Patient is undergone chest tube placement. EXAM: PORTABLE CHEST 1 VIEW COMPARISON:  CT scan of the chest of today's date performed during the biopsy.  FINDINGS: There has been Re interval re-expansion of the right lung. No pneumothorax is evident currently. A small caliber pigtail chest tube is present along the lateral aspects of the right fifth and sixth ribs. There is interstitial prominence in the right mid lung. The background interstitial markings of both lungs are coarse though stable. The cardiac silhouette is enlarged. The pulmonary vascularity is normal. There is calcification in the wall of the aortic arch. IMPRESSION: Interval re-expansion of the right lung since chest tube placement. Electronically Signed   By: David  Martinique M.D.   On: 02/25/2017 12:24   Ct Perc Pleural Drain W/indwell Cath W/img Guide  Result Date: 02/25/2017 INDICATION: History of pulmonary fibrosis, now with indeterminate hypermetabolic right upper lobe pulmonary nodule. Please perform CT-guided biopsy for tissue diagnostic purposes. EXAM: 1. CT GUIDED RIGHT UPPER LOBE PULMONARY NODULE BIOPSY. 2. CT-GUIDED RIGHT-SIDED CHEST TUBE PLACEMENT. COMPARISON:  PET-CT - 02/09/2017; Chest CT - 01/27/2017 MEDICATIONS: None. ANESTHESIA/SEDATION: Fentanyl 25 mcg IV; Versed 0.5 mg IV Sedation time: 20 minutes; The patient was continuously monitored during the procedure by the interventional radiology nurse under my direct supervision. CONTRAST:  None COMPLICATIONS: SIR LEVEL C - Requires therapy, minor hospitalization (<48 hrs). Procedure complicated by development of a rapidly expanding right-sided  pneumothorax requiring urgent chest tube placement. Fortunately, the patient remained hemodynamically stable prior to successful chest tube placement. PROCEDURE: Informed consent was obtained from the patient following an explanation of the procedure, risks, benefits and alternatives. The patient understands,agrees and consents for the procedure. All questions were addressed. A time out was performed prior to the initiation of the procedure. The patient was positioned supine, slightly LPO on the CT table and a limited chest CT was performed for procedural planning demonstrating unchanged size and positioning of slightly spiculated hypermetabolic nodule within the subpleural aspect of the right upper lobe with dominant component measuring approximately 3.1 x 1.8 cm (image 20, series 2). The operative site was prepped and draped in the usual sterile fashion. Under sterile conditions and local anesthesia, a 17 gauge coaxial needle was advanced into the peripheral aspect of the nodule. Positioning was confirmed with intermittent CT fluoroscopy and followed by the acquisition of 2 core needle biopsies with an 18 gauge core needle biopsy device. The coaxial needle was removed and superficial hemostasis was achieved with manual compression. Immediate postprocedural chest CT demonstrated development of a moderate-sized pneumothorax which was found to enlarged with subsequent minimally delayed imaging. As such, decision was made to place a right-sided chest tube. The skin was anesthetized with 1% lidocaine. An 18 gauge trocar needle was advanced into the pleural space and air was aspirated. A short Amplatz wire was coiled within the pleural space. The track was dilated allowing placement of a 10 French percutaneous drainage catheter. The drainage catheter was connected to a three-way stopcock and approximately 300 cc of air was aspirated. Postprocedural imaging was obtained. The chest tube was connected to a pleural vac  device and secured at the skin entrance site within interrupted suture. A Vaseline gauze dressing and StatLock device were placed. The patient otherwise tolerated the procedure well and remained hemodynamically stable during the chest tube placement. IMPRESSION: 1. Technically successful CT guided core needle core biopsy of indeterminate hypermetabolic right upper lobe pulmonary nodule/mass. 2. Procedure complicated by development of a rapidly expanding right-sided pneumothorax requiring urgent chest tube placement. Fortunately, the patient remained hemodynamically stable during the chest tube placement, with resolution of the shortness of breath following placement of the chest  tube to the pleural vac device and wall suction. Electronically Signed   By: Sandi Mariscal M.D.   On: 02/25/2017 11:24    ASSESSMENT: This is a very pleasant 66 years old African-American male recently diagnosed with a stage IB (T2a, N0, M0) non-small cell lung cancer, squamous cell carcinoma presented with right upper lobe pleural-based mass diagnosed in June 2018.   PLAN: I had a lengthy discussion with the patient today about his current disease stage, prognosis and treatment options. I will complete the staging workup by ordering a MRI of the brain to rule out brain metastasis. The patient is not a good surgical candidate for resection because of his poor pulmonary function. I discussed with the patient other option for management of his condition and recommended for him to consider curative radiotherapy. He is scheduled to see Dr. Tammi Klippel tomorrow for evaluation and discussion of this option. I will continue to monitor the patient closely after his curative radiotherapy by ordering CT scan of the chest in 4 months for reevaluation of his condition and treatment effect. The patient would come back for follow-up visit at that time. For the diabetes mellitus and hypertension as well as COPD he will continue with his current home  medications. For smoking cessation, I strongly encouraged the patient to quit smoking and offered him a smoking cessation program. He was advised to call immediately if he has any concerning symptoms in the interval. The patient voices understanding of current disease status and treatment options and is in agreement with the current care plan. All questions were answered. The patient knows to call the clinic with any problems, questions or concerns. We can certainly see the patient much sooner if necessary.  Thank you so much for allowing me to participate in the care of Rock.. I will continue to follow up the patient with you and assist in his care.  I spent 40 minutes counseling the patient face to face. The total time spent in the appointment was 60 minutes.  Disclaimer: This note was dictated with voice recognition software. Similar sounding words can inadvertently be transcribed and may not be corrected upon review.   Latorie Montesano K. March 09, 2017, 2:29 PM

## 2017-03-09 NOTE — Telephone Encounter (Signed)
Appointments scheduled per 03/09/17 los. Patient was given a copy of the AVS report and appointment schedule, per 03/09/17 los.

## 2017-03-09 NOTE — Patient Instructions (Signed)
Steps to Quit Smoking Smoking tobacco can be bad for your health. It can also affect almost every organ in your body. Smoking puts you and people around you at risk for many serious long-lasting (chronic) diseases. Quitting smoking is hard, but it is one of the best things that you can do for your health. It is never too late to quit. What are the benefits of quitting smoking? When you quit smoking, you lower your risk for getting serious diseases and conditions. They can include:  Lung cancer or lung disease.  Heart disease.  Stroke.  Heart attack.  Not being able to have children (infertility).  Weak bones (osteoporosis) and broken bones (fractures).  If you have coughing, wheezing, and shortness of breath, those symptoms may get better when you quit. You may also get sick less often. If you are pregnant, quitting smoking can help to lower your chances of having a baby of low birth weight. What can I do to help me quit smoking? Talk with your doctor about what can help you quit smoking. Some things you can do (strategies) include:  Quitting smoking totally, instead of slowly cutting back how much you smoke over a period of time.  Going to in-person counseling. You are more likely to quit if you go to many counseling sessions.  Using resources and support systems, such as: ? Online chats with a counselor. ? Phone quitlines. ? Printed self-help materials. ? Support groups or group counseling. ? Text messaging programs. ? Mobile phone apps or applications.  Taking medicines. Some of these medicines may have nicotine in them. If you are pregnant or breastfeeding, do not take any medicines to quit smoking unless your doctor says it is okay. Talk with your doctor about counseling or other things that can help you.  Talk with your doctor about using more than one strategy at the same time, such as taking medicines while you are also going to in-person counseling. This can help make  quitting easier. What things can I do to make it easier to quit? Quitting smoking might feel very hard at first, but there is a lot that you can do to make it easier. Take these steps:  Talk to your family and friends. Ask them to support and encourage you.  Call phone quitlines, reach out to support groups, or work with a counselor.  Ask people who smoke to not smoke around you.  Avoid places that make you want (trigger) to smoke, such as: ? Bars. ? Parties. ? Smoke-break areas at work.  Spend time with people who do not smoke.  Lower the stress in your life. Stress can make you want to smoke. Try these things to help your stress: ? Getting regular exercise. ? Deep-breathing exercises. ? Yoga. ? Meditating. ? Doing a body scan. To do this, close your eyes, focus on one area of your body at a time from head to toe, and notice which parts of your body are tense. Try to relax the muscles in those areas.  Download or buy apps on your mobile phone or tablet that can help you stick to your quit plan. There are many free apps, such as QuitGuide from the CDC (Centers for Disease Control and Prevention). You can find more support from smokefree.gov and other websites.  This information is not intended to replace advice given to you by your health care provider. Make sure you discuss any questions you have with your health care provider. Document Released: 07/04/2009 Document   Revised: 05/05/2016 Document Reviewed: 01/22/2015 Elsevier Interactive Patient Education  2018 Elsevier Inc.  

## 2017-03-09 NOTE — Progress Notes (Addendum)
Thoracic Location of Tumor / Histology: Squamous cell carcinoma of right lung  Patient presented 4 months ago with shortness of breath with exertion.  The shortness of breath has progressively gotten worse and now patient also complains of cough.   Biopsies of right lung on 02/25/2017 revealed:    Tobacco/Marijuana/Snuff/ETOH use: Daily 1 ppd for 50 years, no desire to quit  Past/Anticipated interventions by cardiothoracic surgery, if any: no  Past/Anticipated interventions by medical oncology, if any: no  Signs/Symptoms  Weight changes, if any: no  Respiratory complaints, if any: Shortness of breath with oxygen use. Productive cough with white sputum.  Hemoptysis, if any: no  Pain issues, if any:  Pain at biopsy site.  SAFETY ISSUES:  Prior radiation? no  Pacemaker/ICD? no   Possible current pregnancy?no  Is the patient on methotrexate? no  Current Complaints / other details: 66 year old male. Wife was treated here for bone cancer.

## 2017-03-10 ENCOUNTER — Ambulatory Visit (INDEPENDENT_AMBULATORY_CARE_PROVIDER_SITE_OTHER): Payer: Medicare Other | Admitting: Endocrinology

## 2017-03-10 ENCOUNTER — Encounter: Payer: Self-pay | Admitting: Radiation Oncology

## 2017-03-10 ENCOUNTER — Ambulatory Visit
Admission: RE | Admit: 2017-03-10 | Discharge: 2017-03-10 | Disposition: A | Discharge status: Home / Self Care | Payer: Medicare Other | Source: Ambulatory Visit | Attending: Radiation Oncology | Admitting: Radiation Oncology

## 2017-03-10 ENCOUNTER — Encounter: Payer: Self-pay | Admitting: Endocrinology

## 2017-03-10 VITALS — BP 134/76 | HR 81 | Ht 71.0 in | Wt 272.8 lb

## 2017-03-10 VITALS — BP 117/63 | HR 99 | Resp 20 | Ht 71.0 in | Wt 273.0 lb

## 2017-03-10 DIAGNOSIS — E119 Type 2 diabetes mellitus without complications: Secondary | ICD-10-CM | POA: Insufficient documentation

## 2017-03-10 DIAGNOSIS — E785 Hyperlipidemia, unspecified: Secondary | ICD-10-CM | POA: Insufficient documentation

## 2017-03-10 DIAGNOSIS — Z794 Long term (current) use of insulin: Secondary | ICD-10-CM | POA: Insufficient documentation

## 2017-03-10 DIAGNOSIS — C3411 Malignant neoplasm of upper lobe, right bronchus or lung: Secondary | ICD-10-CM | POA: Diagnosis not present

## 2017-03-10 DIAGNOSIS — Z8249 Family history of ischemic heart disease and other diseases of the circulatory system: Secondary | ICD-10-CM | POA: Insufficient documentation

## 2017-03-10 DIAGNOSIS — J449 Chronic obstructive pulmonary disease, unspecified: Secondary | ICD-10-CM | POA: Insufficient documentation

## 2017-03-10 DIAGNOSIS — Z800 Family history of malignant neoplasm of digestive organs: Secondary | ICD-10-CM | POA: Diagnosis not present

## 2017-03-10 DIAGNOSIS — Z4682 Encounter for fitting and adjustment of non-vascular catheter: Secondary | ICD-10-CM | POA: Diagnosis not present

## 2017-03-10 DIAGNOSIS — J8410 Pulmonary fibrosis, unspecified: Secondary | ICD-10-CM | POA: Insufficient documentation

## 2017-03-10 DIAGNOSIS — Z79899 Other long term (current) drug therapy: Secondary | ICD-10-CM | POA: Diagnosis not present

## 2017-03-10 DIAGNOSIS — I10 Essential (primary) hypertension: Secondary | ICD-10-CM | POA: Insufficient documentation

## 2017-03-10 DIAGNOSIS — Z9889 Other specified postprocedural states: Secondary | ICD-10-CM | POA: Insufficient documentation

## 2017-03-10 DIAGNOSIS — I25709 Atherosclerosis of coronary artery bypass graft(s), unspecified, with unspecified angina pectoris: Secondary | ICD-10-CM

## 2017-03-10 DIAGNOSIS — Z833 Family history of diabetes mellitus: Secondary | ICD-10-CM | POA: Insufficient documentation

## 2017-03-10 DIAGNOSIS — K219 Gastro-esophageal reflux disease without esophagitis: Secondary | ICD-10-CM | POA: Insufficient documentation

## 2017-03-10 DIAGNOSIS — Z510 Encounter for antineoplastic radiation therapy: Secondary | ICD-10-CM | POA: Insufficient documentation

## 2017-03-10 DIAGNOSIS — Z7901 Long term (current) use of anticoagulants: Secondary | ICD-10-CM | POA: Insufficient documentation

## 2017-03-10 DIAGNOSIS — Z823 Family history of stroke: Secondary | ICD-10-CM | POA: Insufficient documentation

## 2017-03-10 DIAGNOSIS — Z801 Family history of malignant neoplasm of trachea, bronchus and lung: Secondary | ICD-10-CM | POA: Insufficient documentation

## 2017-03-10 DIAGNOSIS — E1165 Type 2 diabetes mellitus with hyperglycemia: Secondary | ICD-10-CM

## 2017-03-10 DIAGNOSIS — G4733 Obstructive sleep apnea (adult) (pediatric): Secondary | ICD-10-CM | POA: Diagnosis not present

## 2017-03-10 DIAGNOSIS — Z888 Allergy status to other drugs, medicaments and biological substances status: Secondary | ICD-10-CM | POA: Insufficient documentation

## 2017-03-10 DIAGNOSIS — F17210 Nicotine dependence, cigarettes, uncomplicated: Secondary | ICD-10-CM | POA: Insufficient documentation

## 2017-03-10 DIAGNOSIS — Z8709 Personal history of other diseases of the respiratory system: Secondary | ICD-10-CM | POA: Diagnosis not present

## 2017-03-10 DIAGNOSIS — C3491 Malignant neoplasm of unspecified part of right bronchus or lung: Secondary | ICD-10-CM

## 2017-03-10 HISTORY — DX: Malignant neoplasm of unspecified part of unspecified bronchus or lung: C34.90

## 2017-03-10 NOTE — Progress Notes (Signed)
See progress note under physician encounter. 

## 2017-03-10 NOTE — Patient Instructions (Addendum)
Check blood sugars on waking up 3/7 days   Also check blood sugars about 2 hours after a meal and do this after different meals by rotation  Recommended blood sugar levels on waking up is 90-130 and about 2 hours after meal is 130-160  Please bring your blood sugar monitor to each visit, thank you  Need walking daily  LEVEMIR 28 and keep am sugar <130  More sugars after meals  Less fat and carbs

## 2017-03-10 NOTE — Progress Notes (Signed)
Patient ID: Neil Hafley., male   DOB: May 04, 1951, 66 y.o.   MRN: [id number]   Reason for Appointment : Followup for Type 2 Diabetes  History of Present Illness          Diagnosis: Type 2 diabetes mellitus, date of diagnosis:  2008     Past history: He has been treated with metformin only for several years and had relatively good control until about a year ago. However had been taking only 1000 mg of metformin. Because of his progressively higher blood sugars he had been started on Levemir insulin since 6/14.  However with this his A1c was still 10.2 in 9/14 and he was referred here With increasing Victoza to 1.8 mg in 3/15 his weight had come down and his blood sugars had improved   Recent history:   INSULIN regimen is described as:  Levemir 26 units hs Non-insulin hypoglycemic drugs the patient is taking are: Metformin 2g, Victoza 1.8 mg daily  His A1c is higher at 7.6, previously 7.1  Current management, blood sugar patterns and problems identified:  He has checked blood sugars only sporadically despite reminders to do more readings on his last visit  Levemir was increased slightly on his last visit because of mostly high fasting readings  He has only one or 2 high readings recently in this is only when he goes off his diet  Still not motivated to exercise consistently  Gaining weight despite taking Victoza   Side effects from medications have been: None Compliance with the medical regimen: Fair   Glucose monitoring: Irregular        Glucometer: One Touch.      Blood Glucose readings from download:  Mean values apply above for all meters except median for One Touch  PRE-MEAL Fasting Lunch Dinner Bedtime Overall  Glucose range: 148, 162  127  133  119, 218    Mean/median:     139    POST-MEAL PC Breakfast PC Lunch PC Dinner  Glucose range:   123, 145   Mean/median:      Hypoglycemia frequency: Never.           Self-care:   Meals: 2/day. 11  am 6 pm.  He is eating out at times, more salads Oatmeal in am Physical activity: exercise:  doing a little on treadmill         Dietician visit: Most recent: 09/2013                 Wt Readings from Last 3 Encounters:  03/10/17 272 lb 12.8 oz (123.7 kg)  03/09/17 272 lb 3.2 oz (123.5 kg)  03/04/17 268 lb (121.6 kg)       Lab Results  Component Value Date   HGBA1C 7.6 (H) 03/09/2017   HGBA1C 7.1 12/07/2016   HGBA1C 7.4 (H) 09/03/2016   Lab Results  Component Value Date   MICROALBUR 3.5 (H) 04/27/2016   LDLCALC 48 03/09/2017   CREATININE 1.4 (H) 03/09/2017   Appointment on 03/09/2017  Component Date Value Ref Range Status  . WBC 03/09/2017 10.2  4.0 - 10.3 10e3/uL Final  . NEUT# 03/09/2017 6.5  1.5 - 6.5 10e3/uL Final  . HGB 03/09/2017 16.7  13.0 - 17.1 g/dL Final  . HCT 03/09/2017 50.7* 38.4 - 49.9 % Final  . Platelets 03/09/2017 160  140 - 400 10e3/uL Final  . MCV 03/09/2017 92.2  79.3 - 98.0 fL Final  .  MCH 03/09/2017 30.3  27.2 - 33.4 pg Final  . MCHC 03/09/2017 32.9  32.0 - 36.0 g/dL Final  . RBC 03/09/2017 5.49  4.20 - 5.82 10e6/uL Final  . RDW 03/09/2017 14.0  11.0 - 14.6 % Final  . lymph# 03/09/2017 2.0  0.9 - 3.3 10e3/uL Final  . MONO# 03/09/2017 1.3* 0.1 - 0.9 10e3/uL Final  . Eosinophils Absolute 03/09/2017 0.4  0.0 - 0.5 10e3/uL Final  . Basophils Absolute 03/09/2017 0.1  0.0 - 0.1 10e3/uL Final  . NEUT% 03/09/2017 64.2  39.0 - 75.0 % Final  . LYMPH% 03/09/2017 19.2  14.0 - 49.0 % Final  . MONO% 03/09/2017 12.4  0.0 - 14.0 % Final  . EOS% 03/09/2017 3.4  0.0 - 7.0 % Final  . BASO% 03/09/2017 0.8  0.0 - 2.0 % Final  . Sodium 03/09/2017 143  136 - 145 mEq/L Final  . Potassium 03/09/2017 3.7  3.5 - 5.1 mEq/L Final  . Chloride 03/09/2017 105  98 - 109 mEq/L Final  . CO2 03/09/2017 27  22 - 29 mEq/L Final  . Glucose 03/09/2017 101  70 - 140 mg/dl Final   Glucose reference range is for nonfasting patients. Fasting glucose reference range is 70- 100.  [name] BUN  03/09/2017 17.7  7.0 - 26.0 mg/dL Final  . Creatinine 03/09/2017 1.4* 0.7 - 1.3 mg/dL Final  . Total Bilirubin 03/09/2017 0.32  0.20 - 1.20 mg/dL Final  . Alkaline Phosphatase 03/09/2017 52  40 - 150 U/L Final  . AST 03/09/2017 14  5 - 34 U/L Final  . ALT 03/09/2017 15  0 - 55 U/L Final  . Total Protein 03/09/2017 7.6  6.4 - 8.3 g/dL Final  . Albumin 03/09/2017 3.6  3.5 - 5.0 g/dL Final  . Calcium 03/09/2017 9.6  8.4 - 10.4 mg/dL Final  . Anion Gap 03/09/2017 11  3 - 11 mEq/L Final  . EGFR 03/09/2017 58* >90 ml/min/1.73 m2 Final   eGFR is calculated using the CKD-EPI Creatinine Equation (2009)  Appointment on 03/09/2017  Component Date Value Ref Range Status  . Hgb A1c MFr Bld 03/09/2017 7.6* 4.6 - 6.5 % Final   Glycemic Control Guidelines for People with Diabetes:Non Diabetic:  <6%Goal of Therapy: <7%Additional Action Suggested:  >8%   . Sodium 03/09/2017 139  135 - 145 mEq/L Final  . Potassium 03/09/2017 3.8  3.5 - 5.1 mEq/L Final  . Chloride 03/09/2017 104  96 - 112 mEq/L Final  . CO2 03/09/2017 26  19 - 32 mEq/L Final  . Glucose, Bld 03/09/2017 136* 70 - 99 mg/dL Final  . BUN 03/09/2017 17  6 - 23 mg/dL Final  . Creatinine, Ser 03/09/2017 1.41  0.40 - 1.50 mg/dL Final  . Total Bilirubin 03/09/2017 0.3  0.2 - 1.2 mg/dL Final  . Alkaline Phosphatase 03/09/2017 46  39 - 117 U/L Final  . AST 03/09/2017 13  0 - 37 U/L Final  . ALT 03/09/2017 13  0 - 53 U/L Final  . Total Protein 03/09/2017 7.5  6.0 - 8.3 g/dL Final  . Albumin 03/09/2017 4.1  3.5 - 5.2 g/dL Final  . Calcium 03/09/2017 9.5  8.4 - 10.5 mg/dL Final  . GFR 03/09/2017 64.50  >60.00 mL/min Final  . Cholesterol 03/09/2017 99  0 - 200 mg/dL Final   ATP III Classification       Desirable:  < 200 mg/dL               Borderline  High:  200 - 239 mg/dL          High:  > = 240 mg/dL  . Triglycerides 03/09/2017 100.0  0.0 - 149.0 mg/dL Final   Normal:  <150 mg/dLBorderline High:  150 - 199 mg/dL  . HDL 03/09/2017 31.20* >39.00  mg/dL Final  . VLDL 03/09/2017 20.0  0.0 - 40.0 mg/dL Final  . LDL Cholesterol 03/09/2017 48  0 - 99 mg/dL Final  . Total CHOL/HDL Ratio 03/09/2017 3   Final                  Men          Women1/2 Average Risk     3.4          3.3Average Risk          5.0          4.42X Average Risk          9.6          7.13X Average Risk          15.0          11.0                      . NonHDL 03/09/2017 67.96   Final   NOTE:  Non-HDL goal should be 30 mg/dL higher than patient's LDL goal (i.e. LDL goal of < 70 mg/dL, would have non-HDL goal of < 100 mg/dL)  Hospital Outpatient Visit on 03/04/2017  Component Date Value Ref Range Status  . FVC-Pre 03/04/2017 3.55  L Final  . FVC-%Pred-Pre 03/04/2017 87  % Final  . FVC-Post 03/04/2017 3.63  L Final  . FVC-%Pred-Post 03/04/2017 89  % Final  . FVC-%Change-Post 03/04/2017 2  % Final  . FEV1-Pre 03/04/2017 2.46  L Final  . FEV1-%Pred-Pre 03/04/2017 79  % Final  . FEV1-Post 03/04/2017 2.46  L Final  . FEV1-%Pred-Post 03/04/2017 79  % Final  . FEV1-%Change-Post 03/04/2017 0  % Final  . FEV6-Pre 03/04/2017 3.51  L Final  . FEV6-%Pred-Pre 03/04/2017 90  % Final  . FEV6-Post 03/04/2017 3.55  L Final  . FEV6-%Pred-Post 03/04/2017 91  % Final  . FEV6-%Change-Post 03/04/2017 1  % Final  . Pre FEV1/FVC ratio 03/04/2017 69  % Final  . FEV1FVC-%Pred-Pre 03/04/2017 90  % Final  . Post FEV1/FVC ratio 03/04/2017 68  % Final  . FEV1FVC-%Change-Post 03/04/2017 -2  % Final  . Pre FEV6/FVC Ratio 03/04/2017 99  % Final  . FEV6FVC-%Pred-Pre 03/04/2017 102  % Final  . Post FEV6/FVC ratio 03/04/2017 98  % Final  . FEV6FVC-%Pred-Post 03/04/2017 101  % Final  . FEV6FVC-%Change-Post 03/04/2017 -1  % Final  . FEF 25-75 Pre 03/04/2017 1.55  L/sec Final  . FEF2575-%Pred-Pre 03/04/2017 57  % Final  . FEF 25-75 Post 03/04/2017 1.55  L/sec Final  . FEF2575-%Pred-Post 03/04/2017 57  % Final  . FEF2575-%Change-Post 03/04/2017 0  % Final  . RV 03/04/2017 1.53  L Final  . RV % pred  03/04/2017 62  % Final  . TLC 03/04/2017 5.32  L Final  . TLC % pred 03/04/2017 73  % Final  . DLCO unc 03/04/2017 11.15  ml/min/mmHg Final  . DLCO unc % pred 03/04/2017 33  % Final  . DL/VA 03/04/2017 2.36  ml/min/mmHg/L Final  . DL/VA % pred 03/04/2017 50  % Final     Allergies as of 03/10/2017      Reactions  Niacin Nausea Only   REACTION: upset stomach      Medication List       Accurate as of 03/10/17 10:18 AM. Always use your most recent med list.          acetaminophen 650 MG CR tablet Commonly known as:  TYLENOL Take 650 mg by mouth every 8 (eight) hours as needed for pain.   CENTRUM ADULTS Tabs Take 1 tablet by mouth daily.   cilostazol 100 MG tablet Commonly known as:  PLETAL take 1 tablet by mouth twice a day   clobetasol 0.05 % topical foam Commonly known as:  OLUX Apply 1 application topically 2 (two) times daily as needed.   collagenase ointment Commonly known as:  SANTYL Apply 1 application topically daily as needed.   diltiazem 180 MG 24 hr capsule Commonly known as:  CARTIA XT Take 1 capsule (180 mg total) by mouth 2 (two) times daily.   ENBREL SURECLICK 50 MG/ML injection Generic drug:  etanercept Inject 50 mg into the skin 2 (two) times a week. Inject on Sundays & Wednesdays   Fish Oil 1000 MG Caps Take 2 capsules by mouth 2 (two) times daily.   Flax Seed Oil 1000 MG Caps Take 1 capsule by mouth 2 (two) times daily.   Fluticasone-Salmeterol 100-50 MCG/DOSE Aepb Commonly known as:  ADVAIR Inhale 1 puff into the lungs 2 (two) times daily.   furosemide 40 MG tablet Commonly known as:  LASIX Take 2 tablets (80 mg total) by mouth 2 (two) times daily.   LEVEMIR 100 UNIT/ML injection Generic drug:  insulin detemir Inject 26 Units into the skin at bedtime.   losartan 25 MG tablet Commonly known as:  COZAAR Take 1 tablet (25 mg total) by mouth daily.   metFORMIN 1000 MG tablet Commonly known as:  GLUCOPHAGE Take 1,000 mg by mouth 2  (two) times daily with a meal.   metolazone 2.5 MG tablet Commonly known as:  ZAROXOLYN Take 1 tablet (2.5 mg total) by mouth as directed. TAKE 1 TABLET TWICE A WEEK   metoprolol tartrate 100 MG tablet Commonly known as:  LOPRESSOR Take 100-150 mg by mouth See admin instructions. 1 tablet every morning And take 1 and 1/2 tablets by mouth every evening   mupirocin ointment 2 % Commonly known as:  BACTROBAN Apply 1 application topically 2 (two) times daily as needed (affected areas).   potassium chloride SA 20 MEQ tablet Commonly known as:  K-DUR,KLOR-CON Take 1 tablet (20 mEq total) by mouth 2 (two) times daily.   rivaroxaban 20 MG Tabs tablet Commonly known as:  XARELTO Take 20 mg by mouth every morning.   rosuvastatin 10 MG tablet Commonly known as:  CRESTOR Take 1 tablet (10 mg total) by mouth daily.   tamsulosin 0.4 MG Caps capsule Commonly known as:  FLOMAX Take 0.4 mg by mouth daily.   triamcinolone cream 0.1 % Commonly known as:  KENALOG Apply 1 application topically 2 (two) times daily as needed (affected area).   VICTOZA 18 MG/3ML Sopn Generic drug:  liraglutide INJECT 0.3ML (=1.8MG)      SUBCUTANEOUSLY DAILY       Allergies:  Allergies  Allergen Reactions  . Niacin Nausea Only    REACTION: upset stomach    Past Medical History:  Diagnosis Date  . Adenomatous colon polyp   . Aortic insufficiency    Echo 3/18: Severe concentric LVH, EF 60-65, normal wall motion, moderate AI, mild LAE, mild TR  . Arthritis  left hip replacement  . Atrial flutter ([place])    onset  2011. s/p EPS/RFA 12/2011  . Cataract   . Chronic bronchitis   . Contact dermatitis and other eczema due to plants (except food)   . COPD (chronic obstructive pulmonary disease) ([place])   . GERD (gastroesophageal reflux disease)   . Hyperlipidemia   . Hypertension   . LV dysfunction    EF 45-50% 12/2011  . OSA (obstructive sleep apnea) 10/13/2016   Mild with AHI 9.7/hr with significant  oxygen desaturations as low as 76% now on CPAP  . Other peripheral vascular disease(443.89)    bilateral lower extremity  . Tobacco use disorder    dependent  . Transient global amnesia   . Tuberculosis   . Type II diabetes mellitus ([place])     Past Surgical History:  Procedure Laterality Date  . A FLUTTER ABLATION N/A 12/28/2011   Procedure: ABLATION A FLUTTER;  Surgeon: Evans Lance, MD;  [facility] CATH LAB;  Service: Cardiovascular;  Laterality: N/A;  . CARDIAC CATHETERIZATION N/A 07/10/2015   Procedure: Left Heart Cath and Coronary Angiography;  Surgeon: Sherren Mocha, MD;  Location: [place] CV LAB;  Service: Cardiovascular;  Laterality: N/A;  . CARDIAC ELECTROPHYSIOLOGY MAPPING AND ABLATION  03/2010  . CARDIOVERSION N/A 07/13/2016   Procedure: CARDIOVERSION;  Surgeon: Skeet Latch, MD;  Location: [place];  Service: Cardiovascular;  Laterality: N/A;  . COLONOSCOPY    . colonoscopy with polypectomy  2006 & 2011   Dr [name]  . CYSTOSCOPY  11/2007   Dr Jeffie Pollock  . PARTIAL HIP ARTHROPLASTY Right 01/2000   "Right; replaced ball & stem"  . PARTIAL HIP ARTHROPLASTY Left   . POLYPECTOMY    . TEE WITHOUT CARDIOVERSION  12/28/2011   Procedure: TRANSESOPHAGEAL ECHOCARDIOGRAM (TEE);  Surgeon: Larey Dresser, MD;  [facility] ENDOSCOPY;  Service: Cardiovascular;  Laterality: N/A;    Family History  Problem Relation Age of Onset  . Stroke Mother 40  . Hypertension Mother   . Diabetes Mother   . Diabetes Paternal Grandmother   . Lung cancer Father        smoker  . Diabetes Brother   . Heart attack Brother 24  . Diabetes Brother   . Hepatitis C Brother   . Throat cancer Paternal Uncle        1/2 uncle  . Heart attack Brother 93  . Crohn's disease Son   . Colon cancer Neg Hx   . Esophageal cancer Neg Hx   . Rectal cancer Neg Hx   . Stomach cancer Neg Hx     Social History:  reports that he has been smoking Cigarettes.  He has a 44.00 pack-year smoking history. He  has never used smokeless tobacco. He reports that he does not drink alcohol or use drugs.    Review of Systems       Lipids: He has  low HDL, LDL 48 ; this has been managed previously by PCP and cardiologis, taking Crestor 10 mg   Lab Results  Component Value Date   CHOL 99 03/09/2017   HDL 31.20 (L) 03/09/2017   LDLCALC 48 03/09/2017   TRIG 100.0 03/09/2017   CHOLHDL 3 03/09/2017              Has occasional   numbness, tingling in the right first or second toes, no burning in feet, on B6 vitamins OTC Diabetic foot exam in 12/17 showed normal monofilament sensation in the toes and  plantar surfaces, no skin lesions or ulcers on the feet and absent pedal pulses  Hypertension: Blood pressure controlled     Physical Examination:  BP 134/76   Pulse 81   Ht '5\' 11"'  (1.803 m)   Wt 272 lb 12.8 oz (123.7 kg)   SpO2 (!) 89%   BMI 38.05 kg/m       ASSESSMENT:  Diabetes type 2, uncontrolled    See history of present illness for detailed discussion of current diabetes management, blood sugar patterns and problems identified  Currently on maximum dose of Victoza and metformin along with basal insulin  His control is Higher at 7.6 Since he does not appear to have consistently high fasting readings he probably needs to do better with his diet, exercise and weight loss regimen to help his control He is not completely motivated to change his diet or start regular exercise especially with hot weather  PLAN:  Increase Levemir by 2 units Discussed adjusting this based on fasting blood sugar and keeping them below 130 More readings after meals and checked sugars more often at least for a couple of weeks before his follow-up More consistent reduction of high fat and high carbohydrate meals Walk daily   Follow-up in 3 months     Patient Instructions  Check blood sugars on waking up 3/7 days   Also check blood sugars about 2 hours after a meal and do this after different meals  by rotation  Recommended blood sugar levels on waking up is 90-130 and about 2 hours after meal is 130-160  Please bring your blood sugar monitor to each visit, thank you  Need walking daily  LEVEMIR 28 and keep am sugar <130  More sugars after meals  Less fat and carbs          Neil Carroll 03/10/2017, 10:18 AM   Note: This office note was prepared with Estate agent. Any transcriptional errors that result from this process are unintentional.

## 2017-03-10 NOTE — Progress Notes (Signed)
Radiation Oncology         [phone]) [phone] ________________________________  Initial outpatient Consultation  Name: Neil Carroll Sr. 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 Malignant neoplasm of right upper lobe of lung (HCC) C34.11   2. Stage I squamous cell carcinoma of right lung (HCC) C34.91     HISTORY OF PRESENT ILLNESS: Neil Carroll. is a 66 y.o. male seen at the request of Dr. Servando Snare. He has longstanding history of COPD and OSA and is followed closely by pulmonology. He presented 4 months ago with increased shortness of breath with exertion which progressively worsened.   A CXR on 01/15/17 revealed questionable fibrosis bilaterally.  This was further evaluated with Chest CT on 01/27/17 which revealed an aggressive appearing 3.0 x 1.6 x 3.4 cm mass in the periphery of the right upper lobe highly concerning for primary bronchogenic neoplasm.  PET scan on 02/09/17 showed a 3 cm peripheral right upper lobe lung mass with moderate hypermetabolism suggesting neoplasm. No findings for metastatic disease.  He underwent a CT guided core biopsy of the RUL mass on 02/25/17 which revealed squamous cell carcinoma.  Unfortunately, the patient developed a pneumothorax following this procedure requiring emergent chest tube placement which was later removed.    The patient met with Dr. Servando Snare but was not felt to be a good surgical candidate due to his poor pulmonary function and multiple comorbidities.  The patient has also met with Dr. Julien Nordmann on 03/09/2017 and is felt to be a good candidate for definitive radiotherapy.  Of note, the patient is a smoker with a 44-pack-year history.  The patient presents to the clinic today to discuss the  role of definitive radiotherapy in the treatment of his disease.  PREVIOUS RADIATION THERAPY: No  PAST MEDICAL HISTORY:  Past Medical History:  Diagnosis Date  . Adenomatous colon polyp   . Aortic insufficiency    Echo 3/18: Severe concentric LVH, EF 60-65, normal wall motion, moderate AI, mild LAE, mild TR  . Arthritis    left hip replacement  . Atrial flutter ([place])    onset  2011. s/p EPS/RFA 12/2011  . Cataract   . Chronic bronchitis   . Contact dermatitis and other eczema due to plants (except food)   . COPD (chronic obstructive pulmonary disease) ([place])   . GERD (gastroesophageal reflux disease)   . Hyperlipidemia   . Hypertension   . Lung cancer ([place])   . LV dysfunction    EF 45-50% 12/2011  . OSA (obstructive sleep apnea) 10/13/2016   Mild with AHI 9.7/hr with significant oxygen desaturations as low as 76% now on CPAP  . Other peripheral vascular disease(443.89)    bilateral lower extremity  . Tobacco use disorder    dependent  . Transient global amnesia   . Tuberculosis   . Type II diabetes mellitus ([place])       PAST SURGICAL HISTORY: Past Surgical History:  Procedure Laterality Date  . A FLUTTER ABLATION N/A 12/28/2011   Procedure: ABLATION A FLUTTER;  Surgeon: Evans Lance, MD;  Location[facility] C[facility] LAB;  Service: Cardiovascular;  Laterality: N/A;  . CARDIAC CATHETERIZATION N/A 07/10/2015   Procedure: Left Heart Cath and Coronary  Angiography;  Surgeon: Sherren Mocha, MD;  Location: [facility] CV LAB;  Service: Cardiovascular;  Laterality: N/A;  . CARDIAC ELECTROPHYSIOLOGY MAPPING AND ABLATION  03/2010  . CARDIOVERSION N/A 07/13/2016   Procedure: CARDIOVERSION;  Surgeon: Skeet Latch, MD;  Location: [place];  Service: Cardiovascular;  Laterality: N/A;  . COLONOSCOPY    . colonoscopy with polypectomy  2006 & 2011   Dr [name]  . CYSTOSCOPY  11/2007   Dr Jeffie Pollock  . PARTIAL HIP ARTHROPLASTY Right 01/2000   "Right; replaced ball & stem"  . PARTIAL HIP  ARTHROPLASTY Left   . POLYPECTOMY    . TEE WITHOUT CARDIOVERSION  12/28/2011   Procedure: TRANSESOPHAGEAL ECHOCARDIOGRAM (TEE);  Surgeon: Larey Dresser, MD;  Location: [facility] ENDOSCOPY;  Service: Cardiovascular;  Laterality: N/A;    FAMILY HISTORY:  Family History  Problem Relation Age of Onset  . Stroke Mother 49  . Hypertension Mother   . Diabetes Mother   . Diabetes Paternal Grandmother   . Lung cancer Father        smoker  . Diabetes Brother   . Heart attack Brother 15  . Diabetes Brother   . Hepatitis C Brother   . Throat cancer Paternal Uncle        1/2 uncle  . Heart attack Brother 83  . Crohn's disease Son   . Colon cancer Neg Hx   . Esophageal cancer Neg Hx   . Rectal cancer Neg Hx   . Stomach cancer Neg Hx     SOCIAL HISTORY:  Social History   Social History  . Marital status: Widowed    Spouse name: N/A  . Number of children: 5  . Years of education: N/A   Occupational History  . security guard    Social History Main Topics  . Smoking status: Current Every Day Smoker    Packs/day: 1.00    Years: 44.00    Types: Cigarettes  . Smokeless tobacco: Never Used     Comment: started @ age 30, up to 2 ppd;1.25-1.5 ppd as of 11/23/13  . Alcohol use No     Comment:  11/23/13 "2-3 drinks per year"  . Drug use: No  . Sexual activity: Not Currently   Other Topics Concern  . Not on file   Social History Narrative  . No narrative on file    ALLERGIES: Niacin  MEDICATIONS:  Current Outpatient Prescriptions  Medication Sig Dispense Refill  . acetaminophen (TYLENOL) 650 MG CR tablet Take 650 mg by mouth every 8 (eight) hours as needed for pain.    . cilostazol (PLETAL) 100 MG tablet take 1 tablet by mouth twice a day 180 tablet 1  . clobetasol (OLUX) 0.05 % topical foam Apply 1 application topically 2 (two) times daily as needed.   0  . collagenase (SANTYL) ointment Apply 1 application topically daily as needed.     . diltiazem (CARTIA XT) 180 MG 24 hr capsule  Take 1 capsule (180 mg total) by mouth 2 (two) times daily. 180 capsule 3  . etanercept (ENBREL SURECLICK) 50 MG/ML injection Inject 50 mg into the skin 2 (two) times a week. Inject on Sundays & Wednesdays    . Flaxseed, Linseed, (FLAX SEED OIL) 1000 MG CAPS Take 1 capsule by mouth 2 (two) times daily.     . Fluticasone-Salmeterol (ADVAIR) 100-50 MCG/DOSE AEPB Inhale 1 puff into the lungs 2 (two) times daily. 180 each 1  . furosemide (LASIX) 40 MG tablet Take 2 tablets (  80 mg total) by mouth 2 (two) times daily. 120 tablet 11  . insulin detemir (LEVEMIR) 100 UNIT/ML injection Inject 26 Units into the skin at bedtime.    [name] losartan (COZAAR) 25 MG tablet Take 1 tablet (25 mg total) by mouth daily. 90 tablet 3  . metFORMIN (GLUCOPHAGE) 1000 MG tablet Take 1,000 mg by mouth 2 (two) times daily with a meal.    . metolazone (ZAROXOLYN) 2.5 MG tablet Take 1 tablet (2.5 mg total) by mouth as directed. TAKE 1 TABLET TWICE A WEEK 30 tablet 3  . metoprolol (LOPRESSOR) 100 MG tablet Take 100-150 mg by mouth See admin instructions. 1 tablet every morning And take 1 and 1/2 tablets by mouth every evening    . Multiple Vitamins-Minerals (CENTRUM ADULTS) TABS Take 1 tablet by mouth daily.    . mupirocin ointment (BACTROBAN) 2 % Apply 1 application topically 2 (two) times daily as needed (affected areas).     . Omega-3 Fatty Acids (FISH OIL) 1000 MG CAPS Take 2 capsules by mouth 2 (two) times daily.     . potassium chloride SA (K-DUR,KLOR-CON) 20 MEQ tablet Take 1 tablet (20 mEq total) by mouth 2 (two) times daily. 60 tablet 11  . rivaroxaban (XARELTO) 20 MG TABS tablet Take 20 mg by mouth every morning.     . rosuvastatin (CRESTOR) 10 MG tablet Take 1 tablet (10 mg total) by mouth daily. 90 tablet 3  . tamsulosin (FLOMAX) 0.4 MG CAPS capsule Take 0.4 mg by mouth daily.     [name] triamcinolone cream (KENALOG) 0.1 % Apply 1 application topically 2 (two) times daily as needed (affected area).     [name] VICTOZA 18 MG/3ML SOPN  INJECT 0.3ML (=1.8MG)      SUBCUTANEOUSLY DAILY 27 mL 1   No current facility-administered medications for this encounter.     REVIEW OF SYSTEMS:  On review of systems, the patient reports that he is doing well overall. The patient reports pain at his recent biopsy site which is gradually improving. He denies any chest pain, shortness of breath, cough, fevers, chills, night sweats, unintended weight changes. He reports chronic shortness of breath as well as a productive cough with white sputum. He uses supplemental oxygen at home with his CPAP; he does not use supplemental oxygen during the day although this has been recommended by his pulmonologist. He denies hemoptysis, dyspnea, fever or chills. He denies any bowel or bladder disturbances, and denies abdominal pain, nausea or vomiting. He denies any new musculoskeletal or joint aches or pains. A complete review of systems is obtained and is otherwise negative.  PHYSICAL EXAM:  Wt Readings from Last 3 Encounters:  03/10/17 273 lb (123.8 kg)  03/10/17 272 lb 12.8 oz (123.7 kg)  03/09/17 272 lb 3.2 oz (123.5 kg)   Temp Readings from Last 3 Encounters:  03/09/17 98.6 F (37 C) (Oral)  02/27/17 98.1 F (36.7 C) (Oral)  02/22/17 97.6 F (36.4 C) (Oral)   BP Readings from Last 3 Encounters:  03/10/17 117/63  03/10/17 134/76  03/09/17 (!) 99/59   Pulse Readings from Last 3 Encounters:  03/10/17 99  03/10/17 81  03/09/17 60   In general this is a well appearing African American man in no acute distress. He is alert and oriented x4 and appropriate throughout the examination. HEENT reveals that the patient is normocephalic, atraumatic. Skin is intact without any evidence of gross lesions. Cardiovascular exam reveals a regular rate and rhythm, no clicks rubs or  murmurs are auscultated. The lungs are clear to ausculation with expiratory wheezing bilaterally, no rales or rhonchi. Lymphatic assessment is performed and does not reveal any adenopathy  in the cervical, supraclavicular, axillary, or inguinal chains. The abdomen is soft, non-distended and non-tender to palpation.  There are normal BS throughout.  The ower extremities are positive for 1+ pitting edema bilaterally but no cyanosis, deep calf tenderness or clubbing.  KPS = 90   LABORATORY DATA:  Lab Results  Component Value Date   WBC 10.2 03/09/2017   HGB 16.7 03/09/2017   HCT 50.7 (H) 03/09/2017   MCV 92.2 03/09/2017   PLT 160 03/09/2017   Lab Results  Component Value Date   NA 143 03/09/2017   K 3.7 03/09/2017   CL 104 03/09/2017   CO2 27 03/09/2017   Lab Results  Component Value Date   ALT 15 03/09/2017   AST 14 03/09/2017   ALKPHOS 52 03/09/2017   BILITOT 0.32 03/09/2017     RADIOGRAPHY: Ct Biopsy  Result Date: 02/25/2017 INDICATION: History of pulmonary fibrosis, now with indeterminate hypermetabolic right upper lobe pulmonary nodule. Please perform CT-guided biopsy for tissue diagnostic purposes. EXAM: 1. CT GUIDED RIGHT UPPER LOBE PULMONARY NODULE BIOPSY. 2. CT-GUIDED RIGHT-SIDED CHEST TUBE PLACEMENT. COMPARISON:  PET-CT - 02/09/2017; Chest CT - 01/27/2017 MEDICATIONS: None. ANESTHESIA/SEDATION: Fentanyl 25 mcg IV; Versed 0.5 mg IV Sedation time: 20 minutes; The patient was continuously monitored during the procedure by the interventional radiology nurse under my direct supervision. CONTRAST:  None COMPLICATIONS: SIR LEVEL C - Requires therapy, minor hospitalization (<48 hrs). Procedure complicated by development of a rapidly expanding right-sided pneumothorax requiring urgent chest tube placement. Fortunately, the patient remained hemodynamically stable prior to successful chest tube placement. PROCEDURE: Informed consent was obtained from the patient following an explanation of the procedure, risks, benefits and alternatives. The patient understands,agrees and consents for the procedure. All questions were addressed. A time out was performed prior to the  initiation of the procedure. The patient was positioned supine, slightly LPO on the CT table and a limited chest CT was performed for procedural planning demonstrating unchanged size and positioning of slightly spiculated hypermetabolic nodule within the subpleural aspect of the right upper lobe with dominant component measuring approximately 3.1 x 1.8 cm (image 20, series 2). The operative site was prepped and draped in the usual sterile fashion. Under sterile conditions and local anesthesia, a 17 gauge coaxial needle was advanced into the peripheral aspect of the nodule. Positioning was confirmed with intermittent CT fluoroscopy and followed by the acquisition of 2 core needle biopsies with an 18 gauge core needle biopsy device. The coaxial needle was removed and superficial hemostasis was achieved with manual compression. Immediate postprocedural chest CT demonstrated development of a moderate-sized pneumothorax which was found to enlarged with subsequent minimally delayed imaging. As such, decision was made to place a right-sided chest tube. The skin was anesthetized with 1% lidocaine. An 18 gauge trocar needle was advanced into the pleural space and air was aspirated. A short Amplatz wire was coiled within the pleural space. The track was dilated allowing placement of a 10 French percutaneous drainage catheter. The drainage catheter was connected to a three-way stopcock and approximately 300 cc of air was aspirated. Postprocedural imaging was obtained. The chest tube was connected to a pleural vac device and secured at the skin entrance site within interrupted suture. A Vaseline gauze dressing and StatLock device were placed. The patient otherwise tolerated the procedure well and remained hemodynamically  stable during the chest tube placement. IMPRESSION: 1. Technically successful CT guided core needle core biopsy of indeterminate hypermetabolic right upper lobe pulmonary nodule/mass. 2. Procedure complicated  by development of a rapidly expanding right-sided pneumothorax requiring urgent chest tube placement. Fortunately, the patient remained hemodynamically stable during the chest tube placement, with resolution of the shortness of breath following placement of the chest tube to the pleural vac device and wall suction. Electronically Signed   By: Sandi Mariscal M.D.   On: 02/25/2017 11:24   Dg Chest Port 1 View  Result Date: 02/27/2017 CLINICAL DATA:  Status post chest tube removal. EXAM: PORTABLE CHEST 1 VIEW COMPARISON:  Study obtained earlier in the day. FINDINGS: Right chest tube is been removed. No evident pneumothorax. There is underlying interstitial fibrosis. There is airspace consolidation in the periphery of the right mid lung. No new opacity. There is mild cardiomegaly with pulmonary vascularity within normal limits. No adenopathy. No evident bone lesions. IMPRESSION: No pneumothorax following chest tube removal. Airspace consolidation right lateral mid lung again noted. Underlying interstitial fibrosis. No new opacity. Stable cardiac prominence. Electronically Signed   By: Lowella Grip III M.D.   On: 02/27/2017 14:08   Dg Chest Port 1 View  Result Date: 02/27/2017 CLINICAL DATA:  66 year old male with right pneumothorax following CT-guided right upper lobe pulmonary nodule treated with CT-guided chest tube placement. EXAM: PORTABLE CHEST 1 VIEW COMPARISON:  02/26/2017 and earlier. FINDINGS: Portable AP semi upright view at 0610 hours. Lateral approach right pigtail chest tube remains in place. Resolved small volume right chest wall subcutaneous gas. No residual right pneumothorax identified. Underlying chronic lung disease with coarse reticulonodular opacity right greater than left. Stable cardiac size and mediastinal contours. Visualized tracheal air column is within normal limits. No pleural effusion or worsening ventilation. IMPRESSION: 1. Stable right chest tube.  No residual pneumothorax. 2.  Underlying interstitial lung disease. No new cardiopulmonary abnormality. Electronically Signed   By: Genevie Ann M.D.   On: 02/27/2017 07:59   Portable Chest 1 View  Result Date: 02/26/2017 CLINICAL DATA:  Follow-up pneumothorax EXAM: PORTABLE CHEST 1 VIEW COMPARISON:  02/25/2017 FINDINGS: Cardiac shadow is stable. Right-sided chest tube is noted. No pneumothorax is seen. Stable right upper lobe mass lesion is again noted. Diffuse fibrotic changes are noted throughout both lungs. No new focal abnormality is noted. IMPRESSION: No recurrent pneumothorax is noted. Stable changes are seen bilaterally. Electronically Signed   By: Inez Catalina M.D.   On: 02/26/2017 07:35   Dg Chest Port 1 View  Result Date: 02/25/2017 CLINICAL DATA:  CT-guided biopsy of the right lung with subsequent pneumothorax. Patient is undergone chest tube placement. EXAM: PORTABLE CHEST 1 VIEW COMPARISON:  CT scan of the chest of today's date performed during the biopsy. FINDINGS: There has been Re interval re-expansion of the right lung. No pneumothorax is evident currently. A small caliber pigtail chest tube is present along the lateral aspects of the right fifth and sixth ribs. There is interstitial prominence in the right mid lung. The background interstitial markings of both lungs are coarse though stable. The cardiac silhouette is enlarged. The pulmonary vascularity is normal. There is calcification in the wall of the aortic arch. IMPRESSION: Interval re-expansion of the right lung since chest tube placement. Electronically Signed   By: David  Martinique M.D.   On: 02/25/2017 12:24   Ct Perc Pleural Drain W/indwell Cath W/img Guide  Result Date: 02/25/2017 INDICATION: History of pulmonary fibrosis, now with indeterminate hypermetabolic right  upper lobe pulmonary nodule. Please perform CT-guided biopsy for tissue diagnostic purposes. EXAM: 1. CT GUIDED RIGHT UPPER LOBE PULMONARY NODULE BIOPSY. 2. CT-GUIDED RIGHT-SIDED CHEST TUBE PLACEMENT.  COMPARISON:  PET-CT - 02/09/2017; Chest CT - 01/27/2017 MEDICATIONS: None. ANESTHESIA/SEDATION: Fentanyl 25 mcg IV; Versed 0.5 mg IV Sedation time: 20 minutes; The patient was continuously monitored during the procedure by the interventional radiology nurse under my direct supervision. CONTRAST:  None COMPLICATIONS: SIR LEVEL C - Requires therapy, minor hospitalization (<48 hrs). Procedure complicated by development of a rapidly expanding right-sided pneumothorax requiring urgent chest tube placement. Fortunately, the patient remained hemodynamically stable prior to successful chest tube placement. PROCEDURE: Informed consent was obtained from the patient following an explanation of the procedure, risks, benefits and alternatives. The patient understands,agrees and consents for the procedure. All questions were addressed. A time out was performed prior to the initiation of the procedure. The patient was positioned supine, slightly LPO on the CT table and a limited chest CT was performed for procedural planning demonstrating unchanged size and positioning of slightly spiculated hypermetabolic nodule within the subpleural aspect of the right upper lobe with dominant component measuring approximately 3.1 x 1.8 cm (image 20, series 2). The operative site was prepped and draped in the usual sterile fashion. Under sterile conditions and local anesthesia, a 17 gauge coaxial needle was advanced into the peripheral aspect of the nodule. Positioning was confirmed with intermittent CT fluoroscopy and followed by the acquisition of 2 core needle biopsies with an 18 gauge core needle biopsy device. The coaxial needle was removed and superficial hemostasis was achieved with manual compression. Immediate postprocedural chest CT demonstrated development of a moderate-sized pneumothorax which was found to enlarged with subsequent minimally delayed imaging. As such, decision was made to place a right-sided chest tube. The skin was  anesthetized with 1% lidocaine. An 18 gauge trocar needle was advanced into the pleural space and air was aspirated. A short Amplatz wire was coiled within the pleural space. The track was dilated allowing placement of a 10 French percutaneous drainage catheter. The drainage catheter was connected to a three-way stopcock and approximately 300 cc of air was aspirated. Postprocedural imaging was obtained. The chest tube was connected to a pleural vac device and secured at the skin entrance site within interrupted suture. A Vaseline gauze dressing and StatLock device were placed. The patient otherwise tolerated the procedure well and remained hemodynamically stable during the chest tube placement. IMPRESSION: 1. Technically successful CT guided core needle core biopsy of indeterminate hypermetabolic right upper lobe pulmonary nodule/mass. 2. Procedure complicated by development of a rapidly expanding right-sided pneumothorax requiring urgent chest tube placement. Fortunately, the patient remained hemodynamically stable during the chest tube placement, with resolution of the shortness of breath following placement of the chest tube to the pleural vac device and wall suction. Electronically Signed   By: Sandi Mariscal M.D.   On: 02/25/2017 11:24      IMPRESSION/PLAN: 1. 66 y.o. 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  Today, we and I talked to the patient about the findings and work-up thus far.  We discussed the natural history of lung cancer and general treatment, highlighting the role of radiotherapy in the management.  We discussed the available radiation techniques, and focused on the details of logistics and delivery of SBRT.  We reviewed the anticipated acute and late sequelae associated with radiation in this setting.  This patient is  not a good surgical candidate due to his multiple comorbidities including COPD and pulmonary fibrosis.  He is a good  candidate for a course of 3-5 SBRT treatments given over 1-2 weeks.  The patient was encouraged to ask questions that were answered to the best of our ability. The patient would like to proceed with SBRT and is scheduled for CT simulation on 03/15/17 at 9am. Today, the patient freely signed a consent form to proceed with SBRT.  We spent 40 minutes face to face with the patient and more than 50% of that time was spent in counseling and/or coordination of care.   Nicholos Johns, PA-C    Tyler Pita, MD  [facility] Direct Dial: [phone]  Fax: [phone] [email or web address]  Skype  [email or web address]  This document serves as a record of services personally performed by Tyler Pita, MD and Freeman Caldron, PA-C. It was created on their behalf by Maryla Morrow, a trained medical scribe. The creation of this record is based on the scribe's personal observations and the provider's statements to them. This document has been checked and approved by the attending provider.

## 2017-03-12 ENCOUNTER — Ambulatory Visit (INDEPENDENT_AMBULATORY_CARE_PROVIDER_SITE_OTHER): Payer: Medicare Other | Admitting: Pulmonary Disease

## 2017-03-12 ENCOUNTER — Encounter: Payer: Self-pay | Admitting: Pulmonary Disease

## 2017-03-12 ENCOUNTER — Telehealth: Payer: Self-pay | Admitting: Internal Medicine

## 2017-03-12 DIAGNOSIS — F17200 Nicotine dependence, unspecified, uncomplicated: Secondary | ICD-10-CM | POA: Diagnosis not present

## 2017-03-12 DIAGNOSIS — I25709 Atherosclerosis of coronary artery bypass graft(s), unspecified, with unspecified angina pectoris: Secondary | ICD-10-CM | POA: Diagnosis not present

## 2017-03-12 DIAGNOSIS — J84112 Idiopathic pulmonary fibrosis: Secondary | ICD-10-CM | POA: Diagnosis not present

## 2017-03-12 DIAGNOSIS — C3491 Malignant neoplasm of unspecified part of right bronchus or lung: Secondary | ICD-10-CM

## 2017-03-12 MED ORDER — PREDNISONE 10 MG PO TABS: 10.0000 mg | ORAL_TABLET | Freq: Every day | ORAL | 0 refills | Status: DC

## 2017-03-12 NOTE — Assessment & Plan Note (Signed)
Smoking cessation was again discussed

## 2017-03-12 NOTE — Assessment & Plan Note (Signed)
Plan is for SBRT He needs to complete MRI brain for staging

## 2017-03-12 NOTE — Assessment & Plan Note (Signed)
We will hold off on starting treatment until he completes radiation therapy We'll discuss further on next visit

## 2017-03-12 NOTE — Progress Notes (Signed)
   Subjective:    Patient ID: Neil Mam., male    DOB: 1951-03-08, 65 y.o.   MRN: [id number]  HPI  66 year old heavy smoker with chronic diastolic heart failure for FU of nocturnal hypoxia,  ILD and COPD. He has chronic atrial fibrillation, underwent DC CV in 06/2016. He is maintained on Enbrel for psoriasis  He continues to smoke. 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 He was considered not a candidate for surgery & SBRT is planned. He is to complete brain MRI for staging. Biopsy was completed by right pneumothorax, he states that his breathing is back to normal He continues to smoke but has cut down to half pack per day   Significant tests/ events reviewed  Echo 01/2016 shows normal LVEF with RVSP 39 05/2016 PSG showed mild OSA with AHI 10/hour with lowest desaturation of 76%-on a subsequent titration study, CPAP was titrated to 13 cm with good control of events and he needed 2 L of oxygen to correct for nocturnal hypoxia  Spirometry 09/2016 >> no evidence of airway obstruction with a ratio of 82 and FEV1 of 73% FVC of 68% suggestive of moderate restriction.  HRCT 01/2017 >> probable IPF, 3.0 x 1.6 x 3.4 cm mass in the periphery of the right upper lobe  PFTs 02/2017-no airway obstruction, FVC 87%, TLC 73%, DLCO 33%  Review of Systems neg for any significant sore throat, dysphagia, itching, sneezing, nasal congestion or excess/ purulent secretions, fever, chills, sweats, unintended wt loss, pleuritic or exertional cp, hempoptysis, orthopnea pnd or change in chronic leg swelling. Also denies presyncope, palpitations, heartburn, abdominal pain, nausea, vomiting, diarrhea or change in bowel or urinary habits, dysuria,hematuria, rash, arthralgias, visual complaints, headache, numbness weakness or ataxia.     Objective:   Physical Exam  Gen. Pleasant, obese, in no distress ENT - no lesions, no post nasal  drip Neck: No JVD, no thyromegaly, no carotid bruits Lungs: no use of accessory muscles, no dullness to percussion, decreased with bibasal rales no rhonchi  Cardiovascular: Rhythm regular, heart sounds  normal, no murmurs or gallops, no peripheral edema Musculoskeletal: No deformities, no cyanosis or clubbing , no tremors       Assessment & Plan:

## 2017-03-12 NOTE — Telephone Encounter (Signed)
Pt stopped by the office to see if Dr Sharlet Salina would like to see him. He has been having radiation for lung cancer and wanted her to review over what all they have been doing. I did tell him that she is out of the office until September.

## 2017-03-12 NOTE — Telephone Encounter (Signed)
If patient has a issue and or reason to be seen now, please set him up with another provider. If there is no reason other reason, set him up for when Dr. Sharlet Salina is back with a continue OV.

## 2017-03-12 NOTE — Patient Instructions (Signed)
Good luck with radiation treatment Call us if breathing gets worse

## 2017-03-15 ENCOUNTER — Ambulatory Visit
Admission: RE | Admit: 2017-03-15 | Discharge: 2017-03-15 | Disposition: A | Discharge status: Home / Self Care | Payer: Medicare Other | Source: Ambulatory Visit | Attending: Radiation Oncology | Admitting: Radiation Oncology

## 2017-03-15 DIAGNOSIS — G4733 Obstructive sleep apnea (adult) (pediatric): Secondary | ICD-10-CM | POA: Diagnosis not present

## 2017-03-15 DIAGNOSIS — F17210 Nicotine dependence, cigarettes, uncomplicated: Secondary | ICD-10-CM | POA: Diagnosis not present

## 2017-03-15 DIAGNOSIS — C3411 Malignant neoplasm of upper lobe, right bronchus or lung: Secondary | ICD-10-CM | POA: Diagnosis not present

## 2017-03-15 DIAGNOSIS — C3491 Malignant neoplasm of unspecified part of right bronchus or lung: Secondary | ICD-10-CM

## 2017-03-15 DIAGNOSIS — Z510 Encounter for antineoplastic radiation therapy: Secondary | ICD-10-CM | POA: Diagnosis not present

## 2017-03-15 DIAGNOSIS — J449 Chronic obstructive pulmonary disease, unspecified: Secondary | ICD-10-CM | POA: Diagnosis not present

## 2017-03-15 DIAGNOSIS — Z4682 Encounter for fitting and adjustment of non-vascular catheter: Secondary | ICD-10-CM | POA: Diagnosis not present

## 2017-03-15 NOTE — Progress Notes (Signed)
  Radiation Oncology         [phone]) [phone] ________________________________  Name: Neil Housekeeper Sr. MRN: [id number]  Date: 03/15/2017  DOB: 03-01-1951  STEREOTACTIC BODY RADIOTHERAPY SIMULATION AND TREATMENT PLANNING NOTE    ICD-10-CM   1. Stage I squamous cell carcinoma of right lung (HCC) C34.91     DIAGNOSIS:  66 yo man with stage IB cT2a cN0 cM0 squamous cell carcinoma of the right upper lobe of the lung  NARRATIVE:  The patient was brought to the [facility].  Identity was confirmed.  All relevant records and images related to the planned course of therapy were reviewed.  The patient freely provided informed written consent to proceed with treatment after reviewing the details related to the planned course of therapy. The consent form was witnessed and verified by the simulation staff.  Then, the patient was set-up in a stable reproducible  supine position for radiation therapy.  A BodyFix immobilization pillow was fabricated for reproducible positioning.  Then I personally applied the abdominal compression paddle to limit respiratory excursion.  4D respiratoy motion management CT images were obtained.  Surface markings were placed.  The CT images were loaded into the planning software.  Then, using Cine, MIP, and standard views, the internal target volume (ITV) and planning target volumes (PTV) were delinieated, and avoidance structures were contoured.  Treatment planning then occurred.  The radiation prescription was entered and confirmed.  A total of two complex treatment devices were fabricated in the form of the BodyFix immobilization pillow and a neck accuform cushion.  I have requested : 3D Simulation  I have requested a DVH of the following structures: Heart, Lungs, Esophagus, Chest Wall, Brachial Plexus, Major Blood Vessels, and targets.  SPECIAL TREATMENT PROCEDURE:  The planned course of therapy using radiation constitutes a special treatment procedure. Special  care is required in the management of this patient for the following reasons. This treatment constitutes a Special Treatment Procedure for the following reason: [ High dose per fraction requiring special monitoring for increased toxicities of treatment including daily imaging..  The special nature of the planned course of radiotherapy will require increased physician supervision and oversight to ensure patient's safety with optimal treatment outcomes.  RESPIRATORY MOTION MANAGEMENT SIMULATION:  In order to account for effect of respiratory motion on target structures and other organs in the planning and delivery of radiotherapy, this patient underwent respiratory motion management simulation.  To accomplish this, when the patient was brought to the CT simulation planning suite, 4D respiratoy motion management CT images were obtained.  The CT images were loaded into the planning software.  Then, using a variety of tools including Cine, MIP, and standard views, the target volume and planning target volumes (PTV) were delineated.  Avoidance structures were contoured.  Treatment planning then occurred.  Dose volume histograms were generated and reviewed for each of the requested structure.  The resulting plan was carefully reviewed and approved today.  PLAN:  The patient will receive 54 Gy in 3 fraction.  ________________________________  Sheral Apley Tammi Klippel, M.D.

## 2017-03-16 ENCOUNTER — Encounter (HOSPITAL_COMMUNITY): Payer: Self-pay

## 2017-03-16 ENCOUNTER — Ambulatory Visit (HOSPITAL_COMMUNITY)
Admission: RE | Admit: 2017-03-16 | Discharge: 2017-03-16 | Disposition: A | Discharge status: Home / Self Care | Payer: Medicare Other | Source: Ambulatory Visit | Attending: Internal Medicine | Admitting: Internal Medicine

## 2017-03-16 ENCOUNTER — Telehealth: Payer: Self-pay | Admitting: Medical Oncology

## 2017-03-16 DIAGNOSIS — C3491 Malignant neoplasm of unspecified part of right bronchus or lung: Secondary | ICD-10-CM

## 2017-03-16 DIAGNOSIS — F40240 Claustrophobia: Secondary | ICD-10-CM

## 2017-03-16 MED ORDER — GADOBENATE DIMEGLUMINE 529 MG/ML IV SOLN: 20.0000 mL | Freq: Once | INTRAVENOUS | Status: DC | PRN

## 2017-03-16 NOTE — Telephone Encounter (Signed)
MRI at [facility] too tight . His appt changed to July 6 on  larger machine, He is asking for something to relax him.

## 2017-03-17 DIAGNOSIS — F17210 Nicotine dependence, cigarettes, uncomplicated: Secondary | ICD-10-CM | POA: Diagnosis not present

## 2017-03-17 DIAGNOSIS — Z510 Encounter for antineoplastic radiation therapy: Secondary | ICD-10-CM | POA: Diagnosis not present

## 2017-03-17 DIAGNOSIS — Z4682 Encounter for fitting and adjustment of non-vascular catheter: Secondary | ICD-10-CM | POA: Diagnosis not present

## 2017-03-17 DIAGNOSIS — C3411 Malignant neoplasm of upper lobe, right bronchus or lung: Secondary | ICD-10-CM | POA: Diagnosis not present

## 2017-03-17 DIAGNOSIS — G4733 Obstructive sleep apnea (adult) (pediatric): Secondary | ICD-10-CM | POA: Diagnosis not present

## 2017-03-17 DIAGNOSIS — J449 Chronic obstructive pulmonary disease, unspecified: Secondary | ICD-10-CM | POA: Diagnosis not present

## 2017-03-17 NOTE — Telephone Encounter (Signed)
Ok to give ativan 1 mg 30 min before the procedure.

## 2017-03-18 MED ORDER — LORAZEPAM 1 MG PO TABS: 1.0000 mg | ORAL_TABLET | Freq: Once | ORAL | 0 refills | Status: AC

## 2017-03-18 NOTE — Addendum Note (Signed)
Addended by: Ardeen Garland on: 03/18/2017 10:10 AM   Modules accepted: Orders

## 2017-03-21 ENCOUNTER — Other Ambulatory Visit: Payer: Self-pay | Admitting: Endocrinology

## 2017-03-26 ENCOUNTER — Encounter (HOSPITAL_COMMUNITY): Payer: Self-pay

## 2017-03-26 ENCOUNTER — Ambulatory Visit (HOSPITAL_COMMUNITY)
Admission: RE | Admit: 2017-03-26 | Discharge: 2017-03-26 | Disposition: A | Discharge status: Home / Self Care | Payer: Medicare Other | Source: Ambulatory Visit | Attending: Internal Medicine | Admitting: Internal Medicine

## 2017-03-26 ENCOUNTER — Other Ambulatory Visit: Payer: Self-pay | Admitting: Internal Medicine

## 2017-03-26 DIAGNOSIS — C3411 Malignant neoplasm of upper lobe, right bronchus or lung: Secondary | ICD-10-CM | POA: Insufficient documentation

## 2017-03-26 DIAGNOSIS — C3491 Malignant neoplasm of unspecified part of right bronchus or lung: Secondary | ICD-10-CM | POA: Diagnosis not present

## 2017-03-26 MED ORDER — GADOBENATE DIMEGLUMINE 529 MG/ML IV SOLN
20.0000 mL | Freq: Once | INTRAVENOUS | Status: AC | PRN
  Administered 2017-03-26: 20 mL via INTRAVENOUS

## 2017-03-29 ENCOUNTER — Telehealth: Payer: Self-pay | Admitting: Internal Medicine

## 2017-03-29 ENCOUNTER — Ambulatory Visit
Admission: RE | Admit: 2017-03-29 | Discharge: 2017-03-29 | Disposition: A | Discharge status: Home / Self Care | Payer: Medicare Other | Source: Ambulatory Visit | Attending: Radiation Oncology | Admitting: Radiation Oncology

## 2017-03-29 DIAGNOSIS — F17210 Nicotine dependence, cigarettes, uncomplicated: Secondary | ICD-10-CM | POA: Diagnosis not present

## 2017-03-29 DIAGNOSIS — C3411 Malignant neoplasm of upper lobe, right bronchus or lung: Secondary | ICD-10-CM | POA: Diagnosis not present

## 2017-03-29 DIAGNOSIS — G4733 Obstructive sleep apnea (adult) (pediatric): Secondary | ICD-10-CM | POA: Diagnosis not present

## 2017-03-29 DIAGNOSIS — J449 Chronic obstructive pulmonary disease, unspecified: Secondary | ICD-10-CM | POA: Diagnosis not present

## 2017-03-29 DIAGNOSIS — Z4682 Encounter for fitting and adjustment of non-vascular catheter: Secondary | ICD-10-CM | POA: Diagnosis not present

## 2017-03-29 DIAGNOSIS — Z510 Encounter for antineoplastic radiation therapy: Secondary | ICD-10-CM | POA: Diagnosis not present

## 2017-03-29 NOTE — Telephone Encounter (Signed)
Pt would like a call back regarding his Crestor  Stated it was put in wrong and has resulted him in a $600 bill.   Please call back

## 2017-03-30 ENCOUNTER — Telehealth: Payer: Self-pay | Admitting: Radiation Oncology

## 2017-03-30 NOTE — Telephone Encounter (Signed)
Called and got no answer, LVM

## 2017-03-30 NOTE — Telephone Encounter (Signed)
Phoned patient per Dr. Johny Shears order and explained his brain MRI was normal and that the "cancer has not spread to his brain". Patient verbalized understanding and expressed appreciation for the call.

## 2017-03-31 ENCOUNTER — Telehealth: Payer: Self-pay

## 2017-03-31 ENCOUNTER — Ambulatory Visit
Admission: RE | Admit: 2017-03-31 | Discharge: 2017-03-31 | Disposition: A | Discharge status: Home / Self Care | Payer: Medicare Other | Source: Ambulatory Visit | Attending: Radiation Oncology | Admitting: Radiation Oncology

## 2017-03-31 ENCOUNTER — Telehealth: Payer: Self-pay | Admitting: Cardiology

## 2017-03-31 DIAGNOSIS — J449 Chronic obstructive pulmonary disease, unspecified: Secondary | ICD-10-CM | POA: Diagnosis not present

## 2017-03-31 DIAGNOSIS — C3411 Malignant neoplasm of upper lobe, right bronchus or lung: Secondary | ICD-10-CM | POA: Diagnosis not present

## 2017-03-31 DIAGNOSIS — F17210 Nicotine dependence, cigarettes, uncomplicated: Secondary | ICD-10-CM | POA: Diagnosis not present

## 2017-03-31 DIAGNOSIS — L400 Psoriasis vulgaris: Secondary | ICD-10-CM | POA: Diagnosis not present

## 2017-03-31 DIAGNOSIS — G4733 Obstructive sleep apnea (adult) (pediatric): Secondary | ICD-10-CM | POA: Diagnosis not present

## 2017-03-31 DIAGNOSIS — Z510 Encounter for antineoplastic radiation therapy: Secondary | ICD-10-CM | POA: Diagnosis not present

## 2017-03-31 DIAGNOSIS — Z4682 Encounter for fitting and adjustment of non-vascular catheter: Secondary | ICD-10-CM | POA: Diagnosis not present

## 2017-03-31 MED ORDER — ROSUVASTATIN CALCIUM 10 MG PO TABS: 10.0000 mg | ORAL_TABLET | Freq: Every day | ORAL | 0 refills | Status: DC

## 2017-03-31 NOTE — Telephone Encounter (Signed)
New message     Pt has some questions on about the tests that Dr Radford Pax ordered the O2 Titration test ?

## 2017-03-31 NOTE — Telephone Encounter (Signed)
Patient requesting the generic Crestor be sent to [facility] on [address]. Saying the name brand cost to much that its like a 700 dollar co pay

## 2017-03-31 NOTE — Addendum Note (Signed)
Addended by: Mauricio Po D on: 03/31/2017 01:59 PM   Modules accepted: Orders

## 2017-03-31 NOTE — Telephone Encounter (Signed)
Rosuvastatin sent to pharmacy.

## 2017-04-01 ENCOUNTER — Telehealth: Payer: Self-pay | Admitting: Pulmonary Disease

## 2017-04-01 NOTE — Telephone Encounter (Signed)
LMOM for Neil Carroll informing her that the Snapshot is being faxed and that the documentation is in the Care Coordination Note at the very top.  (did not realize at that time Dr Radford Pax is in [place] but offered to fax any ov notes needed.)  Would recommend leaving phone note open for another day or so to ensure nothing further is needed from Dr. Theodosia Blender office.

## 2017-04-05 ENCOUNTER — Encounter: Payer: Self-pay | Admitting: Radiation Oncology

## 2017-04-05 ENCOUNTER — Ambulatory Visit
Admission: RE | Admit: 2017-04-05 | Discharge: 2017-04-05 | Disposition: A | Discharge status: Home / Self Care | Payer: Medicare Other | Source: Ambulatory Visit | Attending: Radiation Oncology | Admitting: Radiation Oncology

## 2017-04-05 ENCOUNTER — Encounter (HOSPITAL_BASED_OUTPATIENT_CLINIC_OR_DEPARTMENT_OTHER): Payer: Medicare Other

## 2017-04-05 DIAGNOSIS — C3411 Malignant neoplasm of upper lobe, right bronchus or lung: Secondary | ICD-10-CM | POA: Diagnosis not present

## 2017-04-05 DIAGNOSIS — G4733 Obstructive sleep apnea (adult) (pediatric): Secondary | ICD-10-CM | POA: Diagnosis not present

## 2017-04-05 DIAGNOSIS — J449 Chronic obstructive pulmonary disease, unspecified: Secondary | ICD-10-CM | POA: Diagnosis not present

## 2017-04-05 DIAGNOSIS — F17210 Nicotine dependence, cigarettes, uncomplicated: Secondary | ICD-10-CM | POA: Diagnosis not present

## 2017-04-05 DIAGNOSIS — Z4682 Encounter for fitting and adjustment of non-vascular catheter: Secondary | ICD-10-CM | POA: Diagnosis not present

## 2017-04-05 DIAGNOSIS — Z510 Encounter for antineoplastic radiation therapy: Secondary | ICD-10-CM | POA: Diagnosis not present

## 2017-04-07 NOTE — Progress Notes (Signed)
  Radiation Oncology         [phone]) [phone] ________________________________  Name: Neil Housekeeper Sr. MRN: [id number]  Date: 04/05/2017  DOB: [birth year]  End of Treatment Note  Diagnosis:   66 yo man with stage IB squamous cell carcinoma of the right upper lobe of the lung     Indication for treatment:  Curative, Definitive SBRT       Radiation treatment dates:   03/29/2017 to 04/05/2017  Site/dose:   The target was treated to 54 Gy in 3 fractions of 18 Gy  Beams/energy:   The patient was treated using stereotactic body radiotherapy according to a 3D conformal radiotherapy plan.  Volumetric arc fields were employed to deliver 6 MV X-rays.  Image guidance was performed with per fraction cone beam CT prior to treatment under personal MD supervision.  Immobilization was achieved using BodyFix Pillow.  Narrative: The patient tolerated radiation treatment relatively well. Denies pain. Reports a productive cough with clear sputum. Denies hemoptysis. Reports SOB continues and is unchanged since the start of SBRT. Denies pain associated with swallowing. Reports moderate fatigue. Reports he finished a prednisone taper within the last few days.   Plan: The patient has completed radiation treatment. The patient will return to radiation oncology clinic for routine followup in one month. I advised them to call or return sooner if they have any questions or concerns related to their recovery or treatment. ________________________________  Sheral Apley. Tammi Klippel, M.D.  This document serves as a record of services personally performed by Tyler Pita, MD. It was created on his behalf by Arlyce Harman, a trained medical scribe. The creation of this record is based on the scribe's personal observations and the provider's statements to them. This document has been checked and approved by the attending provider.

## 2017-04-09 ENCOUNTER — Encounter: Payer: Self-pay | Admitting: Radiation Oncology

## 2017-04-12 ENCOUNTER — Encounter: Payer: Self-pay | Admitting: Physician Assistant

## 2017-04-12 ENCOUNTER — Ambulatory Visit (INDEPENDENT_AMBULATORY_CARE_PROVIDER_SITE_OTHER): Payer: Medicare Other | Admitting: Physician Assistant

## 2017-04-12 VITALS — BP 102/58 | HR 88 | Ht 71.0 in | Wt 275.8 lb

## 2017-04-12 DIAGNOSIS — I481 Persistent atrial fibrillation: Secondary | ICD-10-CM

## 2017-04-12 DIAGNOSIS — I5032 Chronic diastolic (congestive) heart failure: Secondary | ICD-10-CM | POA: Diagnosis not present

## 2017-04-12 DIAGNOSIS — I110 Hypertensive heart disease with heart failure: Secondary | ICD-10-CM

## 2017-04-12 DIAGNOSIS — C3491 Malignant neoplasm of unspecified part of right bronchus or lung: Secondary | ICD-10-CM

## 2017-04-12 DIAGNOSIS — I25709 Atherosclerosis of coronary artery bypass graft(s), unspecified, with unspecified angina pectoris: Secondary | ICD-10-CM | POA: Diagnosis not present

## 2017-04-12 DIAGNOSIS — I209 Angina pectoris, unspecified: Secondary | ICD-10-CM

## 2017-04-12 DIAGNOSIS — I4819 Other persistent atrial fibrillation: Secondary | ICD-10-CM

## 2017-04-12 NOTE — Patient Instructions (Addendum)
Medication Instructions:  Your physician recommends that you continue on your current medications as directed. Please refer to the Current Medication list given to you today.   Labwork: NONE ORDERED TODAY  Testing/Procedures: NONE ORDERED TODAY  Follow-Up: 06/21/17 @ 9:40 WITH DR. Burt Knack  Any Other Special Instructions Will Be Listed Below (If Applicable).     If you need a refill on your cardiac medications before your next appointment, please call your pharmacy.

## 2017-04-12 NOTE — Progress Notes (Signed)
Cardiology Office Note:    Date:  04/12/2017   ID:  Neil Housekeeper Sr., DOB 08-Nov-1950, MRN [id number]  PCP:  Hoyt Koch, MD  Cardiologist:  Dr. Sherren Mocha   Pulmonology: [name] Oncology: Dr. Julien Nordmann Radiation Oncology: Dr. Tammi Klippel  Referring MD: Hoyt Koch, *   Chief Complaint  Patient presents with  . Follow-up    CHF, AFib, CAD    History of Present Illness:    Neil Carroll. is a 66 y.o. male with a hx of Atrial flutter status post RFCA, PAD with bilateral SFA occlusions, CAD treated medically, diastolic heart failure, sleep apnea, DM, tobacco abuse, COPD/pulmonary fibrosis. The patient has had recurrent atrial fibrillation/flutter and underwent unsuccessful cardioversion in 10/17. Therefore, he has been treated with a rate control strategy.  He complained of a syncopal episode in 3/18.  An event monitor demonstrated AFib but no pauses or ventricular arrhythmias. His heart rate averaged greater than 100. His EF was normal on echocardiogram.  Last seen by Dr. Burt Knack 4/18. Calcium channel blocker was increased for better rate control. He underwent lung biopsy in 6/18 for lung mass. Procedure was complicated by pneumothorax. He required chest tube placement. Pathology was consistent with squamous cell carcinoma (Stage IB cT2a cN0 cM0). He has seen Dr. Servando Snare. He is not felt to be a surgical candidate given his severe lung disease. Therefore, he has been referred for radiation therapy.   [name] returns for Cardiology follow up. He is here alone.  He is overall stable.  He has a chronic cough without change.  He has completed 3 radiation treatments for his Lung CA.  He has an occasional chest pain when he first lays down in bed at night.  He denies exertional chest pain.  He is chronically short of breath.  He is on chronic O2.  He denies any changes in his breathing.  He denies syncope.  He wears CPAP at night.  He denies any bleeding  issues.  Prior CV studies:   The following studies were reviewed today:  Echo 11/24/16 Severe concentric LVH, EF 60-65, normal wall motion, moderate AI, mild LAE, mild TR  Event monitor 3/18 Atrial fibrillation, average HR 100-115, no ventricular arrhythmias, no pathologic pauses  Echo 6/17 GLS -11.7%, moderate LVH, EF 55-60, normal wall motion, mild AI, MAC, mild MR, mild TR   Echo 5/17 EF 60-65, normal wall motion, aortic sclerosis without stenosis, trivial AI, aortic root 39 mm, ascending aorta 39 mm, trivial MR, LA/RA upper limits of normal, mild TR, PASP 39   Event monitor 3/17 Sinus rhythm and atrial fibrillation No SVT or VT noted.   LHC 10/16 LAD irregularities LCx distal 80 RCA mid 100-CTO EF 55-65   Carotid US 1/14 Plaque without significant ICA stenosis bilaterally   Myoview 4/13 Abnormal stress nuclear study with a small, moderate intensity, partially reversible inferior defect consistent with inferior thinning and mild inferior ischemia.  LV Ejection Fraction: Study not gated    Past Medical History:  Diagnosis Date  . Adenomatous colon polyp   . Aortic insufficiency    Echo 3/18: Severe concentric LVH, EF 60-65, normal wall motion, moderate AI, mild LAE, mild TR  . Arthritis    left hip replacement  . Atrial flutter ([place])    onset  2011. s/p EPS/RFA 12/2011  . Cataract   . Chronic bronchitis   . Contact dermatitis and other eczema due to plants (except food)   . COPD (chronic  obstructive pulmonary disease) ([place])   . GERD (gastroesophageal reflux disease)   . Hyperlipidemia   . Hypertension   . Lung cancer ([place])   . LV dysfunction    EF 45-50% 12/2011  . OSA (obstructive sleep apnea) 10/13/2016   Mild with AHI 9.7/hr with significant oxygen desaturations as low as 76% now on CPAP  . Other peripheral vascular disease(443.89)    bilateral lower extremity  . Tobacco use disorder    dependent  . Transient global amnesia   . Tuberculosis   . Type II  diabetes mellitus ([place])     Past Surgical History:  Procedure Laterality Date  . A FLUTTER ABLATION N/A 12/28/2011   Procedure: ABLATION A FLUTTER;  Surgeon: Evans Lance, MD;  Location: [facility] CATH LAB;  Service: Cardiovascular;  Laterality: N/A;  . CARDIAC CATHETERIZATION N/A 07/10/2015   Procedure: Left Heart Cath and Coronary Angiography;  Surgeon: Sherren Mocha, MD;  Location: [facility] CV LAB;  Service: Cardiovascular;  Laterality: N/A;  . CARDIAC ELECTROPHYSIOLOGY MAPPING AND ABLATION  03/2010  . CARDIOVERSION N/A 07/13/2016   Procedure: CARDIOVERSION;  Surgeon: Skeet Latch, MD;  Location: [facility];  Service: Cardiovascular;  Laterality: N/A;  . COLONOSCOPY    . colonoscopy with polypectomy  2006 & 2011   Dr [name]  . CYSTOSCOPY  11/2007   Dr Jeffie Pollock  . PARTIAL HIP ARTHROPLASTY Right 01/2000   "Right; replaced ball & stem"  . PARTIAL HIP ARTHROPLASTY Left   . POLYPECTOMY    . TEE WITHOUT CARDIOVERSION  12/28/2011   Procedure: TRANSESOPHAGEAL ECHOCARDIOGRAM (TEE);  Surgeon: Larey Dresser, MD;  Location: [facility] ENDOSCOPY;  Service: Cardiovascular;  Laterality: N/A;    Current Medications: Current Meds  Medication Sig  . acetaminophen (TYLENOL) 650 MG CR tablet Take 650 mg by mouth every 8 (eight) hours as needed for pain.  . cilostazol (PLETAL) 100 MG tablet take 1 tablet by mouth twice a day  . clobetasol (OLUX) 0.05 % topical foam Apply 1 application topically 2 (two) times daily as needed.   . collagenase (SANTYL) ointment Apply 1 application topically daily as needed.   . diltiazem (CARTIA XT) 180 MG 24 hr capsule Take 1 capsule (180 mg total) by mouth 2 (two) times daily.  [name] etanercept (ENBREL SURECLICK) 50 MG/ML injection Inject 50 mg into the skin 2 (two) times a week. Inject on Sundays & Wednesdays  . Flaxseed, Linseed, (FLAX SEED OIL) 1000 MG CAPS Take 1 capsule by mouth 2 (two) times daily.   . Fluticasone-Salmeterol (ADVAIR) 100-50 MCG/DOSE AEPB Inhale 1 puff into  the lungs 2 (two) times daily.  . furosemide (LASIX) 40 MG tablet Take 2 tablets (80 mg total) by mouth 2 (two) times daily.  . insulin detemir (LEVEMIR) 100 UNIT/ML injection Inject 26 Units into the skin at bedtime.  [name] losartan (COZAAR) 25 MG tablet Take 1 tablet (25 mg total) by mouth daily.  . metFORMIN (GLUCOPHAGE) 1000 MG tablet Take 1,000 mg by mouth 2 (two) times daily with a meal.  . metolazone (ZAROXOLYN) 2.5 MG tablet Take 1 tablet (2.5 mg total) by mouth as directed. TAKE 1 TABLET TWICE A WEEK  . metoprolol (LOPRESSOR) 100 MG tablet Take 100-150 mg by mouth See admin instructions. 1 tablet every morning And take 1 and 1/2 tablets by mouth every evening  . Multiple Vitamins-Minerals (CENTRUM ADULTS) TABS Take 1 tablet by mouth daily.  . mupirocin ointment (BACTROBAN) 2 % Apply 1 application topically 2 (two) times daily as  needed (affected areas).   . NOVOFINE 32G X 6 MM MISC use twice a day  . Omega-3 Fatty Acids (FISH OIL) 1000 MG CAPS Take 2 capsules by mouth 2 (two) times daily.   Glory Rosebush DELICA LANCETS FINE MISC TEST BLOOD SUGAR DAILY  . potassium chloride SA (K-DUR,KLOR-CON) 20 MEQ tablet Take 1 tablet (20 mEq total) by mouth 2 (two) times daily.  . rivaroxaban (XARELTO) 20 MG TABS tablet Take 20 mg by mouth every morning.   . rosuvastatin (CRESTOR) 10 MG tablet Take 1 tablet (10 mg total) by mouth daily.  . tamsulosin (FLOMAX) 0.4 MG CAPS capsule Take 0.4 mg by mouth daily.   [name] triamcinolone cream (KENALOG) 0.1 % Apply 1 application topically 2 (two) times daily as needed (affected area).   [name] VICTOZA 18 MG/3ML SOPN INJECT 0.3ML (=1.8MG)      SUBCUTANEOUSLY DAILY     Allergies:   Niacin   Social History   Social History  . Marital status: Widowed    Spouse name: N/A  . Number of children: 5  . Years of education: N/A   Occupational History  . security guard    Social History Main Topics  . Smoking status: Current Every Day Smoker    Packs/day: 1.00    Years:  44.00    Types: Cigarettes  . Smokeless tobacco: Never Used     Comment: started @ age 68, up to 2 ppd;1.25-1.5 ppd as of 11/23/13  . Alcohol use No     Comment:  11/23/13 "2-3 drinks per year"  . Drug use: No  . Sexual activity: Not Currently   Other Topics Concern  . None   Social History Narrative  . None     Family Hx: The patient's family history includes Crohn's disease in his son; Diabetes in his brother, brother, mother, and paternal grandmother; Heart attack (age of onset: 22) in his brother; Heart attack (age of onset: 65) in his brother; Hepatitis C in his brother; Hypertension in his mother; Lung cancer in his father; Stroke (age of onset: 39) in his mother; Throat cancer in his paternal uncle. There is no history of Colon cancer, Esophageal cancer, Rectal cancer, or Stomach cancer.  ROS:   Please see the history of present illness.    Review of Systems  Eyes: Positive for visual disturbance.  Cardiovascular: Positive for dyspnea on exertion.  Respiratory: Positive for cough.   Hematologic/Lymphatic: Bruises/bleeds easily.  Skin: Positive for rash.   All other systems reviewed and are negative.   EKGs/Labs/Other Test Reviewed:    EKG:  EKG is  ordered today.  The ekg ordered today demonstrates Atrial fibrillation, HR 88, no significant change when compared to prior tracing  Recent Labs: 12/04/2016: NT-Pro BNP 628 03/09/2017: ALT 15; BUN 17.7; Creatinine 1.4; HGB 16.7; Platelets 160; Potassium 3.7; Sodium 143   Recent Lipid Panel Lab Results  Component Value Date/Time   CHOL 99 03/09/2017 08:51 AM   TRIG 100.0 03/09/2017 08:51 AM   HDL 31.20 (L) 03/09/2017 08:51 AM   CHOLHDL 3 03/09/2017 08:51 AM   LDLCALC 48 03/09/2017 08:51 AM    Physical Exam:    VS:  BP (!) 102/58   Pulse 88   Ht _0  (1.803 m)   Wt 275 lb 12.8 oz (125.1 kg)   SpO2 (!) 86%   BMI 38.47 kg/m     Wt Readings from Last 3 Encounters:  04/12/17 275 lb 12.8 oz (125.1 kg)  03/12/17 272  lb (123.4 kg)  03/10/17 273 lb (123.8 kg)     Physical Exam  Constitutional: He is oriented to person, place, and time. He appears well-developed and well-nourished. No distress.  HENT:  Head: Normocephalic and atraumatic.  Eyes: No scleral icterus.  Neck: No JVD present.  Cardiovascular: Normal rate.  An irregularly irregular rhythm present. Exam reveals no friction rub.   Pulmonary/Chest:  Diffuse rhonchi and inspiratory wheezes throughout  Musculoskeletal: He exhibits edema (1-2+ bilateral LE edema ).  Neurological: He is alert and oriented to person, place, and time.  Skin: Skin is warm and dry.  Psychiatric: He has a normal mood and affect.    ASSESSMENT:    1. Chronic diastolic heart failure (HCC)   2. Persistent atrial fibrillation ([place])   3. Hypertensive heart disease with chronic diastolic congestive heart failure ([place])   4. Coronary artery disease involving coronary bypass graft of native heart with angina pectoris (HCC)   5. Stage I squamous cell carcinoma of right lung (HCC)    PLAN:    In order of problems listed above:  1. Chronic diastolic heart failure (HCC) He is chronically volume overloaded.  He is on high dose furosemide which is augmented with twice weekly metolazone.  His dyspnea is unchanged.  Continue current Rx.    2. Persistent atrial fibrillation (HCC) - Rate control Rx.  CrCl is 92.  Continue Xarelto 20 mg QD.  Recent Creatinine and Hgb stable.  His Diltiazem was adjusted at last visit for better rate control.  His BP will not tolerate further increases.  He has normal EF and therefore, it would be best to avoid Digoxin.  He is not a candidate for Amiodarone for rate control given his lung disease.  FU with Dr. Allegra Lai in 2 weeks as planned.  3. Hypertensive heart disease with chronic diastolic congestive heart failure ([place]) The patient's blood pressure is controlled on his current regimen.  Continue current therapy.    4. Coronary artery  disease involving coronary bypass graft of native heart with angina pectoris ([place]) Hx of chronically occluded RCA and distal LCx disease tx medically.  No significant angina.  He is not on ASA as he is on Rivaroxaban. Continue statin.  5. Stage I squamous cell carcinoma of right lung (HCC) Continue FU with radiation oncology.  He is still smoking.   Dispo:  Return in about 3 months (around 07/13/2017) for Routine Follow Up, w/ Dr. Burt Knack.   Medication Adjustments/Labs and Tests Ordered: Current medicines are reviewed at length with the patient today.  Concerns regarding medicines are outlined above.  Tests Ordered: Orders Placed This Encounter  Procedures  . EKG 12-Lead   Medication Changes: No orders of the defined types were placed in this encounter.   Signed, Richardson Dopp, PA-C  04/12/2017 10:33 AM    [facility] [address], [place], [place]  [id number] Phone: [phone]; Fax: [phone]

## 2017-04-22 ENCOUNTER — Other Ambulatory Visit: Payer: Self-pay | Admitting: Physician Assistant

## 2017-04-23 ENCOUNTER — Other Ambulatory Visit: Payer: Self-pay

## 2017-04-23 MED ORDER — FLUTICASONE-SALMETEROL 100-50 MCG/DOSE IN AEPB: 1.0000 | INHALATION_SPRAY | Freq: Two times a day (BID) | RESPIRATORY_TRACT | 0 refills | Status: DC

## 2017-04-28 ENCOUNTER — Ambulatory Visit (INDEPENDENT_AMBULATORY_CARE_PROVIDER_SITE_OTHER): Payer: Medicare Other | Admitting: Cardiology

## 2017-04-28 ENCOUNTER — Encounter: Payer: Self-pay | Admitting: Cardiology

## 2017-04-28 VITALS — BP 130/66 | HR 86 | Ht 71.0 in | Wt 276.0 lb

## 2017-04-28 DIAGNOSIS — Z720 Tobacco use: Secondary | ICD-10-CM

## 2017-04-28 DIAGNOSIS — I10 Essential (primary) hypertension: Secondary | ICD-10-CM | POA: Diagnosis not present

## 2017-04-28 DIAGNOSIS — I482 Chronic atrial fibrillation: Secondary | ICD-10-CM | POA: Diagnosis not present

## 2017-04-28 DIAGNOSIS — I5032 Chronic diastolic (congestive) heart failure: Secondary | ICD-10-CM | POA: Diagnosis not present

## 2017-04-28 DIAGNOSIS — I25709 Atherosclerosis of coronary artery bypass graft(s), unspecified, with unspecified angina pectoris: Secondary | ICD-10-CM

## 2017-04-28 DIAGNOSIS — I4821 Permanent atrial fibrillation: Secondary | ICD-10-CM

## 2017-04-28 DIAGNOSIS — E782 Mixed hyperlipidemia: Secondary | ICD-10-CM

## 2017-04-28 NOTE — Patient Instructions (Signed)
Your physician wants you to follow-up in: [place] will receive a reminder letter in the mail two months in advance. If you don't receive a letter, please call our office to schedule the follow-up appointment.   If you need a refill on your cardiac medications before your next appointment, please call your pharmacy.

## 2017-04-28 NOTE — Progress Notes (Signed)
Electrophysiology Office Note   Date:  04/28/2017   ID:  Neil Bayron., DOB [birth year], MRN [id number]  PCP:  Neil Koch, MD  Cardiologist:  Neil Carroll Primary Electrophysiologist:  Neil Monahan Meredith Leeds, MD    Chief Complaint  Patient presents with  . Follow-up    Persistent Afib/ATypical AFlutter     History of Present Illness: Neil Dorsch. is a 66 y.o. male who presents today for electrophysiology evaluation.   He has a history of atrial flutter status post redo frequency ablation, peripheral arterial disease with known SFA occlusive disease bilaterally, HTN, DM, COPD, tobacco use and CAD. He recently underwent a [place] by Dr. Burt Carroll 06/2015. This showed severe distal left circumflex stenosis - recommend medical therapy as small amount of myocardium supplied, mild nonobstructive stenosis of a large, wrap around LAD and total occlusion of a small, codominant RCA. Dr. Burt Carroll did not feel that PCI would significantly impact symptoms or overall clinical outcome, thus medical therapy was elected. He has been maintained on Plavix, metoprolol, Crestor and Losartan. He also takes Pletal for his PVD. Was diagnosed with atrial flutter after an ER visit and atrial fibrillation after event monitor. Had cardioversion to sinus rhythm 07/13/16. On 6/18, he had a lung biopsy for mass. She does, located by pneumothorax requiring chest tube placement. 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 Due to his lung disease he was thought not to be a surgical candidate and opted for radiation therapy.  Today, denies symptoms of palpitations, chest pain, orthopnea, PND, lower extremity edema, claudication, dizziness, presyncope, syncope, bleeding, or neurologic sequela. The patient is tolerating medications without difficulties and is otherwise without complaint today.     Past Medical History:  Diagnosis Date  . Adenomatous colon polyp   .  Aortic insufficiency    Echo 3/18: Severe concentric LVH, EF 60-65, normal wall motion, moderate AI, mild LAE, mild TR  . Arthritis    left hip replacement  . Atrial flutter ([place])    onset  2011. s/p EPS/RFA 12/2011  . Cataract   . Chronic bronchitis   . Contact dermatitis and other eczema due to plants (except food)   . COPD (chronic obstructive pulmonary disease) ([place])   . GERD (gastroesophageal reflux disease)   . Hyperlipidemia   . Hypertension   . Lung cancer ([place])   . LV dysfunction    EF 45-50% 12/2011  . OSA (obstructive sleep apnea) 10/13/2016   Mild with AHI 9.7/hr with significant oxygen desaturations as low as 76% now on CPAP  . Other peripheral vascular disease(443.89)    bilateral lower extremity  . Tobacco use disorder    dependent  . Transient global amnesia   . Tuberculosis   . Type II diabetes mellitus ([place])    Past Surgical History:  Procedure Laterality Date  . A FLUTTER ABLATION N/A 12/28/2011   Procedure: ABLATION A FLUTTER;  Surgeon: Neil Lance, MD;  Location: [facility] CATH LAB;  Service: Cardiovascular;  Laterality: N/A;  . CARDIAC CATHETERIZATION N/A 07/10/2015   Procedure: Left Heart Cath and Coronary Angiography;  Surgeon: Neil Mocha, MD;  Location: [place] CV LAB;  Service: Cardiovascular;  Laterality: N/A;  . CARDIAC ELECTROPHYSIOLOGY MAPPING AND ABLATION  03/2010  . CARDIOVERSION N/A 07/13/2016   Procedure: CARDIOVERSION;  Surgeon: Neil Latch, MD;  Location: [place];  Service: Cardiovascular;  Laterality: N/A;  . COLONOSCOPY    . colonoscopy with polypectomy  2006 &  2011   Dr Neil Carroll  . CYSTOSCOPY  11/2007   Dr Neil Carroll  . PARTIAL HIP ARTHROPLASTY Right 01/2000   "Right; replaced ball & stem"  . PARTIAL HIP ARTHROPLASTY Left   . POLYPECTOMY    . TEE WITHOUT CARDIOVERSION  12/28/2011   Procedure: TRANSESOPHAGEAL ECHOCARDIOGRAM (TEE);  Surgeon: Neil Dresser, MD;  Location: [facility] ENDOSCOPY;  Service: Cardiovascular;  Laterality: N/A;      Current Outpatient Prescriptions  Medication Sig Dispense Refill  . acetaminophen (TYLENOL) 650 MG CR tablet Take 650 mg by mouth every 8 (eight) hours as needed for pain.    . cilostazol (PLETAL) 100 MG tablet take 1 tablet by mouth twice a day 180 tablet 1  . clobetasol (OLUX) 0.05 % topical foam Apply 1 application topically 2 (two) times daily as needed.   0  . collagenase (SANTYL) ointment Apply 1 application topically daily as needed.     . diltiazem (CARTIA XT) 180 MG 24 hr capsule Take 1 capsule (180 mg total) by mouth 2 (two) times daily. 180 capsule 3  . etanercept (ENBREL SURECLICK) 50 MG/ML injection Inject 50 mg into the skin 2 (two) times a week. Inject on Sundays & Wednesdays    . Flaxseed, Linseed, (FLAX SEED OIL) 1000 MG CAPS Take 1 capsule by mouth 2 (two) times daily.     . Fluticasone-Salmeterol (ADVAIR) 100-50 MCG/DOSE AEPB Inhale 1 puff into the lungs 2 (two) times daily. Need office visit for refills 60 each 0  . furosemide (LASIX) 40 MG tablet take 2 tablets by mouth twice a day 360 tablet 3  . insulin detemir (LEVEMIR) 100 UNIT/ML injection Inject 28 Units into the skin at bedtime.     . metFORMIN (GLUCOPHAGE) 1000 MG tablet Take 1,000 mg by mouth 2 (two) times daily with a meal.    . metolazone (ZAROXOLYN) 2.5 MG tablet Take 1 tablet (2.5 mg total) by mouth as directed. TAKE 1 TABLET TWICE A WEEK 30 tablet 3  . metoprolol (LOPRESSOR) 100 MG tablet Take 100-150 mg by mouth See admin instructions. 1 tablet every morning And take 1 and 1/2 tablets by mouth every evening    . Multiple Vitamins-Minerals (CENTRUM ADULTS) TABS Take 1 tablet by mouth daily.    . mupirocin ointment (BACTROBAN) 2 % Apply 1 application topically 2 (two) times daily as needed (affected areas).     . NOVOFINE 32G X 6 MM MISC use twice a day 100 each 5  . Omega-3 Fatty Acids (FISH OIL) 1000 MG CAPS Take 2 capsules by mouth 2 (two) times daily.     Glory Rosebush DELICA LANCETS FINE MISC TEST BLOOD  SUGAR DAILY 200 each 12  . potassium chloride SA (K-DUR,KLOR-CON) 20 MEQ tablet take 1 tablet by mouth twice a day 180 tablet 3  . rivaroxaban (XARELTO) 20 MG TABS tablet Take 20 mg by mouth every morning.     . rosuvastatin (CRESTOR) 10 MG tablet Take 1 tablet (10 mg total) by mouth daily. 90 tablet 0  . tamsulosin (FLOMAX) 0.4 MG CAPS capsule Take 0.4 mg by mouth daily.     [name] triamcinolone cream (KENALOG) 0.1 % Apply 1 application topically 2 (two) times daily as needed (affected area).     [name] VICTOZA 18 MG/3ML SOPN INJECT 0.3ML (=1.8MG )      SUBCUTANEOUSLY DAILY 27 mL 1  . losartan (COZAAR) 25 MG tablet Take 1 tablet (25 mg total) by mouth daily. 90 tablet 3   No current facility-administered  medications for this visit.     Allergies:   Niacin   Social History:  The patient  reports that he has been smoking Cigarettes.  He has a 44.00 pack-year smoking history. He has never used smokeless tobacco. He reports that he does not drink alcohol or use drugs.   Family History:  The patient's family history includes Crohn's disease in his son; Diabetes in his brother, brother, mother, and paternal grandmother; Heart attack (age of onset: 1) in his brother; Heart attack (age of onset: 25) in his brother; Hepatitis C in his brother; Hypertension in his mother; Lung cancer in his father; Stroke (age of onset: 59) in his mother; Throat cancer in his paternal uncle.    ROS:  Please see the history of present illness.   Otherwise, review of systems is positive for Leg swelling, palpitations, cough, dyspnea on exertion, wheezing, back pain, rash.   All other systems are reviewed and negative.   PHYSICAL EXAM: VS:  BP 130/66   Pulse 86   Ht 5\' 11"  (1.803 m)   Wt 276 lb (125.2 kg)   SpO2 (!) 89%   BMI 38.49 kg/m  , BMI Body mass index is 38.49 kg/m. GEN: Well nourished, well developed, in no acute distress  HEENT: normal  Neck: no JVD, carotid bruits, or masses Cardiac: iRRR; no murmurs, rubs, or  gallops,no edema  Respiratory:  Course breath sounds bilaterally, normal work of breathing GI: soft, nontender, nondistended, + BS MS: no deformity or atrophy  Skin: warm and dry Neuro:  Strength and sensation are intact Psych: euthymic mood, full affect  EKG:  EKG is not ordered today. Personal review of the ekg ordered 04/12/17 shows atrial fibrillation, incomplete right bundle-branch block, possible old septal infarct, rate 88  Recent Labs: 12/04/2016: NT-Pro BNP 628 03/09/2017: ALT 15; BUN 17.7; Creatinine 1.4; HGB 16.7; Platelets 160; Potassium 3.7; Sodium 143    Lipid Panel     Component Value Date/Time   CHOL 99 03/09/2017 0851   TRIG 100.0 03/09/2017 0851   HDL 31.20 (L) 03/09/2017 0851   CHOLHDL 3 03/09/2017 0851   VLDL 20.0 03/09/2017 0851   LDLCALC 48 03/09/2017 0851     Wt Readings from Last 3 Encounters:  04/28/17 276 lb (125.2 kg)  04/12/17 275 lb 12.8 oz (125.1 kg)  03/12/17 272 lb (123.4 kg)      Other studies Reviewed: Additional studies/ records that were reviewed today include: 30 day monitor 11/20/15  Review of the above records today demonstrates:  Sinus rhythm and atrial fibrillation No SVT or VT noted.  TTE 02/2016 - Left ventricle: Global longitudinal LV strain is calculated at   -11.7% but is inaccurate due to poor tracking. The cavity size   was normal. There was moderate concentric hypertrophy. Systolic   function was normal. The estimated ejection fraction was in the   range of 55% to 60%. Wall motion was normal; there were no   regional wall motion abnormalities. Normal sinus rhythm was   absent. The study is not technically sufficient to allow   evaluation of LV diastolic function. - Aortic valve: Poorly visualized. Trileaflet; normal thickness,   mildly calcified leaflets. There was mild regurgitation. - Mitral valve: Calcified annulus. There was mild regurgitation. - Tricuspid valve: There was mild regurgitation.  ASSESSMENT AND  PLAN:  1.  Atrial fibrillation/atypical atrial flutter: Currently on Xarelto. He is minimally symptomatic from his atrial fibrillation. We'll make no further changes. He is currently a rate  control strategy.  This patients CHA2DS2-VASc Score and unadjusted Ischemic Stroke Rate (% per year) is equal to 4.8 % stroke rate/year from a score of 4  Above score calculated as 1 point each if present [CHF, HTN, DM, Vascular=MI/PAD/Aortic Plaque, Age if 65-74, or Male] Above score calculated as 2 points each if present [Age > 75, or Stroke/TIA/TE]  2. Hypertension: Currently well controlled  3. Hyperlipidemia: on crestor  4. diastolic heart failure: Appears to be well compensated  5. Stage I squamous cell lung cancer: Status post radiation therapy  6. Tobacco abuse: cessation encouraged Current medicines are reviewed at length with the patient today.   The patient does not have concerns regarding his medicines.  The following changes were made today:  none  Labs/ tests ordered today include:  No orders of the defined types were placed in this encounter.    Disposition:   FU with Aleasha Fregeau 12 months  Signed, Derrious Bologna Meredith Leeds, MD  04/28/2017 9:41 AM     [facility] [address] [place] [address] [phone] (office) [phone] (fax)

## 2017-05-04 NOTE — Telephone Encounter (Addendum)
Patient saw Dr Elsworth Soho in June and was sent for an 02 titration. Patient was diagnosed with Non Small Cell Lung cancer.  Patient saw the surgeon had surgery and had 3 radiation treatments that has killed the cancer cells. Patient states he is doing well has a follow up appointment with the doctors on 05/11/17.

## 2017-05-04 NOTE — Telephone Encounter (Signed)
Called the patient to follow up with him since seeing Dr Elsworth Soho and was informed by the patient that he saw Dr Elsworth Soho in June and was sent for an 02 titration. Patient was diagnosed with Non Small Cell Lung cancer.  Patient saw the surgeon had surgery and had 3 radiation treatments that has killed the cancer cells. Patient states he is doing well has a follow up appointment with the doctor on 05/11/17.

## 2017-05-05 DIAGNOSIS — M79672 Pain in left foot: Secondary | ICD-10-CM | POA: Diagnosis not present

## 2017-05-05 DIAGNOSIS — M79671 Pain in right foot: Secondary | ICD-10-CM | POA: Diagnosis not present

## 2017-05-05 DIAGNOSIS — E1351 Other specified diabetes mellitus with diabetic peripheral angiopathy without gangrene: Secondary | ICD-10-CM | POA: Diagnosis not present

## 2017-05-05 DIAGNOSIS — B351 Tinea unguium: Secondary | ICD-10-CM | POA: Diagnosis not present

## 2017-05-07 ENCOUNTER — Encounter: Payer: Self-pay | Admitting: Nurse Practitioner

## 2017-05-07 ENCOUNTER — Ambulatory Visit (INDEPENDENT_AMBULATORY_CARE_PROVIDER_SITE_OTHER): Payer: Medicare Other | Admitting: Nurse Practitioner

## 2017-05-07 VITALS — BP 120/66 | HR 94 | Temp 98.3°F | Ht 71.0 in | Wt 275.0 lb

## 2017-05-07 DIAGNOSIS — I739 Peripheral vascular disease, unspecified: Secondary | ICD-10-CM | POA: Diagnosis not present

## 2017-05-07 DIAGNOSIS — E782 Mixed hyperlipidemia: Secondary | ICD-10-CM | POA: Diagnosis not present

## 2017-05-07 DIAGNOSIS — J441 Chronic obstructive pulmonary disease with (acute) exacerbation: Secondary | ICD-10-CM

## 2017-05-07 MED ORDER — CILOSTAZOL 100 MG PO TABS: 100.0000 mg | ORAL_TABLET | Freq: Two times a day (BID) | ORAL | 1 refills | Status: DC

## 2017-05-07 MED ORDER — ROSUVASTATIN CALCIUM 10 MG PO TABS: 10.0000 mg | ORAL_TABLET | Freq: Every day | ORAL | 3 refills | Status: DC

## 2017-05-07 MED ORDER — FLUTICASONE-SALMETEROL 100-50 MCG/DOSE IN AEPB: 1.0000 | INHALATION_SPRAY | Freq: Two times a day (BID) | RESPIRATORY_TRACT | 6 refills | Status: DC

## 2017-05-07 NOTE — Progress Notes (Signed)
Subjective:  Patient ID: Neil Carroll., male    DOB: 1950/10/17  Age: 66 y.o. MRN: [id number]  CC: Follow-up (medications refills-cilostazol request send in today?advir)   HPI DM: Managed by Dr Dwyane Dee.  CHF: Managed by Dr. Curt Bears  Lung mass: Pulmonology and oncology.  Needs refill of pletal, advair, and crestor. Other medications are managed by endocrinology and cardiology.  Denies any acute complains.  Outpatient Medications Prior to Visit  Medication Sig Dispense Refill  . acetaminophen (TYLENOL) 650 MG CR tablet Take 650 mg by mouth every 8 (eight) hours as needed for pain.    . clobetasol (OLUX) 0.05 % topical foam Apply 1 application topically 2 (two) times daily as needed.   0  . collagenase (SANTYL) ointment Apply 1 application topically daily as needed.     . diltiazem (CARTIA XT) 180 MG 24 hr capsule Take 1 capsule (180 mg total) by mouth 2 (two) times daily. 180 capsule 3  . etanercept (ENBREL SURECLICK) 50 MG/ML injection Inject 50 mg into the skin 2 (two) times a week. Inject on Sundays & Wednesdays    . Flaxseed, Linseed, (FLAX SEED OIL) 1000 MG CAPS Take 1 capsule by mouth 2 (two) times daily.     . furosemide (LASIX) 40 MG tablet take 2 tablets by mouth twice a day 360 tablet 3  . insulin detemir (LEVEMIR) 100 UNIT/ML injection Inject 28 Units into the skin at bedtime.     . metFORMIN (GLUCOPHAGE) 1000 MG tablet Take 1,000 mg by mouth 2 (two) times daily with a meal.    . metolazone (ZAROXOLYN) 2.5 MG tablet Take 1 tablet (2.5 mg total) by mouth as directed. TAKE 1 TABLET TWICE A WEEK 30 tablet 3  . metoprolol (LOPRESSOR) 100 MG tablet Take 100-150 mg by mouth See admin instructions. 1 tablet every morning And take 1 and 1/2 tablets by mouth every evening    . Multiple Vitamins-Minerals (CENTRUM ADULTS) TABS Take 1 tablet by mouth daily.    . mupirocin ointment (BACTROBAN) 2 % Apply 1 application topically 2 (two) times daily as needed (affected areas).      . NOVOFINE 32G X 6 MM MISC use twice a day 100 each 5  . Omega-3 Fatty Acids (FISH OIL) 1000 MG CAPS Take 2 capsules by mouth 2 (two) times daily.     Glory Rosebush DELICA LANCETS FINE MISC TEST BLOOD SUGAR DAILY 200 each 12  . potassium chloride SA (K-DUR,KLOR-CON) 20 MEQ tablet take 1 tablet by mouth twice a day 180 tablet 3  . rivaroxaban (XARELTO) 20 MG TABS tablet Take 20 mg by mouth every morning.     . tamsulosin (FLOMAX) 0.4 MG CAPS capsule Take 0.4 mg by mouth daily.     [name] triamcinolone cream (KENALOG) 0.1 % Apply 1 application topically 2 (two) times daily as needed (affected area).     [name] VICTOZA 18 MG/3ML SOPN INJECT 0.3ML (=1.8MG )      SUBCUTANEOUSLY DAILY 27 mL 1  . cilostazol (PLETAL) 100 MG tablet take 1 tablet by mouth twice a day 180 tablet 1  . Fluticasone-Salmeterol (ADVAIR) 100-50 MCG/DOSE AEPB Inhale 1 puff into the lungs 2 (two) times daily. Need office visit for refills 60 each 0  . rosuvastatin (CRESTOR) 10 MG tablet Take 1 tablet (10 mg total) by mouth daily. 90 tablet 0  . losartan (COZAAR) 25 MG tablet Take 1 tablet (25 mg total) by mouth daily. 90 tablet 3   No facility-administered medications  prior to visit.     ROS See HPI  Objective:  BP 120/66   Pulse 94   Temp 98.3 F (36.8 C)   Ht 5\' 11"  (1.803 m)   Wt 275 lb (124.7 kg)   SpO2 96%   BMI 38.35 kg/m   BP Readings from Last 3 Encounters:  05/07/17 120/66  04/28/17 130/66  04/12/17 (!) 102/58    Wt Readings from Last 3 Encounters:  05/07/17 275 lb (124.7 kg)  04/28/17 276 lb (125.2 kg)  04/12/17 275 lb 12.8 oz (125.1 kg)    Physical Exam  Constitutional: He is oriented to person, place, and time. No distress.  Cardiovascular: Normal rate.   Irregular rhythm  Pulmonary/Chest: Effort normal. No respiratory distress. He has wheezes. He has rales.  Abdominal: There is no tenderness.  Musculoskeletal: He exhibits edema. He exhibits no tenderness.  Neurological: He is alert and oriented to  person, place, and time.  Skin: No erythema.  Vitals reviewed.   Lab Results  Component Value Date   WBC 10.2 03/09/2017   HGB 16.7 03/09/2017   HCT 50.7 (H) 03/09/2017   PLT 160 03/09/2017   GLUCOSE 101 03/09/2017   CHOL 99 03/09/2017   TRIG 100.0 03/09/2017   HDL 31.20 (L) 03/09/2017   LDLCALC 48 03/09/2017   ALT 15 03/09/2017   AST 14 03/09/2017   NA 143 03/09/2017   K 3.7 03/09/2017   CL 104 03/09/2017   CREATININE 1.4 (H) 03/09/2017   BUN 17.7 03/09/2017   CO2 27 03/09/2017   TSH 0.84 11/15/2015   PSA DONE 11/06/2005   INR 1.01 02/25/2017   HGBA1C 7.6 (H) 03/09/2017   MICROALBUR 3.5 (H) 04/27/2016    No results found.  Assessment & Plan:   An was seen today for follow-up.  Diagnoses and all orders for this visit:  Mixed hyperlipidemia -     rosuvastatin (CRESTOR) 10 MG tablet; Take 1 tablet (10 mg total) by mouth daily.  Bronchitis, chronic obstructive, with exacerbation (HCC) -     Fluticasone-Salmeterol (ADVAIR) 100-50 MCG/DOSE AEPB; Inhale 1 puff into the lungs 2 (two) times daily. Need office visit for refills  PVD (peripheral vascular disease) with claudication (HCC) -     cilostazol (PLETAL) 100 MG tablet; Take 1 tablet (100 mg total) by mouth 2 (two) times daily.   I have changed Mr. Feher's cilostazol. I am also having him maintain his Flax Seed Oil, Fish Oil, triamcinolone cream, mupirocin ointment, tamsulosin, clobetasol, acetaminophen, CENTRUM ADULTS, etanercept, rivaroxaban, collagenase, metoprolol tartrate, metFORMIN, losartan, diltiazem, VICTOZA, insulin detemir, metolazone, NOVOFINE, ONETOUCH DELICA LANCETS FINE, furosemide, potassium chloride SA, Fluticasone-Salmeterol, and rosuvastatin.  Meds ordered this encounter  Medications  . cilostazol (PLETAL) 100 MG tablet    Sig: Take 1 tablet (100 mg total) by mouth 2 (two) times daily.    Dispense:  180 tablet    Refill:  1    Do not fill till 05/22/2017    Order Specific Question:    Supervising Provider    Answer:   Cassandria Anger [[id number]]  . Fluticasone-Salmeterol (ADVAIR) 100-50 MCG/DOSE AEPB    Sig: Inhale 1 puff into the lungs 2 (two) times daily. Need office visit for refills    Dispense:  60 each    Refill:  6    Do not fill till 05/22/2017    Order Specific Question:   Supervising Provider    Answer:   Cassandria Anger [[id number]]  . rosuvastatin (CRESTOR) 10 MG  tablet    Sig: Take 1 tablet (10 mg total) by mouth daily.    Dispense:  90 tablet    Refill:  3    Do not fill till 05/22/2017    Order Specific Question:   Supervising Provider    Answer:   Cassandria Anger [[id number]]    Follow-up: Return if symptoms worsen or fail to improve.  Wilfred Lacy, NP

## 2017-05-07 NOTE — Patient Instructions (Signed)
Request for medication to be filled on 05/22/2017 per patient.

## 2017-05-11 ENCOUNTER — Encounter: Payer: Self-pay | Admitting: Urology

## 2017-05-11 ENCOUNTER — Ambulatory Visit
Admission: RE | Admit: 2017-05-11 | Discharge: 2017-05-11 | Disposition: A | Discharge status: Home / Self Care | Payer: Medicare Other | Source: Ambulatory Visit | Attending: Urology | Admitting: Urology

## 2017-05-11 VITALS — BP 106/72 | HR 89 | Temp 98.2°F | Resp 20 | Ht 71.0 in | Wt 273.0 lb

## 2017-05-11 DIAGNOSIS — C3491 Malignant neoplasm of unspecified part of right bronchus or lung: Secondary | ICD-10-CM

## 2017-05-11 DIAGNOSIS — Z7901 Long term (current) use of anticoagulants: Secondary | ICD-10-CM | POA: Insufficient documentation

## 2017-05-11 DIAGNOSIS — C3411 Malignant neoplasm of upper lobe, right bronchus or lung: Secondary | ICD-10-CM | POA: Insufficient documentation

## 2017-05-11 DIAGNOSIS — N401 Enlarged prostate with lower urinary tract symptoms: Secondary | ICD-10-CM | POA: Diagnosis not present

## 2017-05-11 NOTE — Addendum Note (Signed)
Encounter addended by: Malena Edman, RN on: 05/11/2017  4:55 PM<BR>    Actions taken: Charge Capture section accepted

## 2017-05-11 NOTE — Progress Notes (Signed)
Radiation Oncology         [phone]) [phone] ________________________________  Name: Neil Housekeeper Sr. 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  He tolerated radiation treatment relatively well.  He did experience moderate fatigue as well as a productive cough with clear sputum and SOB which was reportedly unchanged since the start of SBRT. He denied hemoptysis, chest pain or pain associated with swallowing. He was treated with a prednisone taper prescribed by his pulmonologist during the course of his treatment.                            On review of systems, the patient states that he is doing well overall. He has continued with a productive cough with clear to whitish sputum and baseline shortness of breath with exertion. He continues on oxygen supplementation via nasal cannula which he uses 24/7. He denies any recent fever, chills, hemoptysis, chest pain, increased shortness of breath or night sweats. He continues with moderate fatigue but feels that this is gradually improving. He has had mild increase in the frequency of coughing more recently but denies any coloration to the sputum. He is not taking any medications and try to control this. He has noticed some mild discomfort in the right upper chest over the past 3-4 days. He describes it as a soreness and reports that it is tender to palpation.  He is scheduled for follow-up appointment with Dr. Julien Nordmann in October 2018 with a CT chest to be inflated prior to that visit. He is quite anxious and requests that a repeat chest CT be ordered  within the next few weeks to assess his progress and response to treatment.  ALLERGIES:  is allergic to niacin.  Meds: Current Outpatient Prescriptions  Medication Sig Dispense Refill  . acetaminophen (TYLENOL) 650 MG CR tablet Take 650 mg by mouth every 8 (eight) hours as needed for pain.    Derrill Memo ON 05/22/2017] cilostazol (PLETAL) 100 MG tablet Take 1 tablet (100 mg total) by mouth 2 (two) times daily. 180 tablet 1  . clobetasol (OLUX) 0.05 % topical foam Apply 1 application topically 2 (two) times daily as needed.   0  . collagenase (SANTYL) ointment Apply 1 application topically daily as needed.     . diltiazem (CARTIA XT) 180 MG 24 hr capsule Take 1 capsule (180 mg total) by mouth 2 (two) times daily. 180 capsule 3  . etanercept (ENBREL SURECLICK) 50 MG/ML injection Inject 50 mg into the skin 2 (two) times a week. Inject on Sundays & Wednesdays    . Flaxseed, Linseed, (FLAX SEED OIL) 1000 MG CAPS Take 1 capsule by mouth 2 (two) times daily.     Derrill Memo ON 05/22/2017] Fluticasone-Salmeterol (ADVAIR) 100-50 MCG/DOSE AEPB Inhale 1 puff into the lungs 2 (two) times daily. Need office visit for refills 60 each 6  . furosemide (LASIX) 40 MG tablet take 2 tablets by mouth twice a day 360 tablet 3  . insulin  detemir (LEVEMIR) 100 UNIT/ML injection Inject 28 Units into the skin at bedtime.     . metFORMIN (GLUCOPHAGE) 1000 MG tablet Take 1,000 mg by mouth 2 (two) times daily with a meal.    . metolazone (ZAROXOLYN) 2.5 MG tablet Take 1 tablet (2.5 mg total) by mouth as directed. TAKE 1 TABLET TWICE A WEEK 30 tablet 3  . metoprolol (LOPRESSOR) 100 MG tablet Take 100-150 mg by mouth See admin instructions. 1 tablet every morning And take 1 and 1/2 tablets by mouth every evening    . Multiple Vitamins-Minerals (CENTRUM ADULTS) TABS Take 1 tablet by mouth daily.    . mupirocin ointment (BACTROBAN) 2 % Apply 1 application topically 2 (two) times daily as needed (affected areas).     . NOVOFINE 32G X 6  MM MISC use twice a day 100 each 5  . Omega-3 Fatty Acids (FISH OIL) 1000 MG CAPS Take 2 capsules by mouth 2 (two) times daily.     . potassium chloride SA (K-DUR,KLOR-CON) 20 MEQ tablet take 1 tablet by mouth twice a day 180 tablet 3  . rivaroxaban (XARELTO) 20 MG TABS tablet Take 20 mg by mouth every morning.     Derrill Memo ON 05/22/2017] rosuvastatin (CRESTOR) 10 MG tablet Take 1 tablet (10 mg total) by mouth daily. 90 tablet 3  . tamsulosin (FLOMAX) 0.4 MG CAPS capsule Take 0.4 mg by mouth daily.     [name] triamcinolone cream (KENALOG) 0.1 % Apply 1 application topically 2 (two) times daily as needed (affected area).     [name] VICTOZA 18 MG/3ML SOPN INJECT 0.3ML (=1.8MG )      SUBCUTANEOUSLY DAILY 27 mL 1  . losartan (COZAAR) 25 MG tablet Take 1 tablet (25 mg total) by mouth daily. 90 tablet 3  . ONETOUCH DELICA LANCETS FINE MISC TEST BLOOD SUGAR DAILY 200 each 12   No current facility-administered medications for this encounter.     Physical Findings:  height is 5\' 11"  (1.803 m) and weight is 273 lb (123.8 kg). His oral temperature is 98.2 F (36.8 C). His blood pressure is 106/72 and his pulse is 89. His respiration is 20 and oxygen saturation is 95%.  Pain Assessment Pain Score: 0-No pain (O2 at 4 L/Min nasal cannular)/10 In general this is a well appearing African-American male in no acute distress. He's alert and oriented x4 and appropriate throughout the examination. Cardiopulmonary assessment is negative for acute distress and he exhibits normal effort.   Lab Findings: Lab Results  Component Value Date   WBC 10.2 03/09/2017   HGB 16.7 03/09/2017   HCT 50.7 (H) 03/09/2017   MCV 92.2 03/09/2017   PLT 160 03/09/2017     Radiographic Findings: No results found.  Impression/Plan: 55. 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 He will follow up as planned with Dr. Julien Nordmann in Oct 2018.  He is quite anxious regarding his response to treatment, so at  the patient's request, I will order a follow-up chest CT to be performed within the next 1-2 weeks. I will plan to call him with those results. I advised that we are happy to continue to participate in his care if clinically indicated but, at this time will plan to follow-up as needed. His overall disease management will continue under the care and direction of Dr. Julien Nordmann with serial CT scans for surveillance going forward. He is encouraged to call with any questions or concerns related  to his previous radiotherapy. He is comfortable with this plan.    Nicholos Johns, PA-C

## 2017-05-12 ENCOUNTER — Telehealth: Payer: Self-pay | Admitting: *Deleted

## 2017-05-12 NOTE — Telephone Encounter (Signed)
CALLED PATIENT TO INFORM OF STAT LABS FOR 05/18/17 @ 1:15 PM AND HIS CT FOR 05-18-17 - ARRIVAL TIME - 2:15 PM, PT. TO BE NPO- 4 HRS. PRIOR TO TEST @ [facility], SPOKE WITH PATIENT AND HE IS AWARE OF THESE APPTS.

## 2017-05-18 ENCOUNTER — Encounter (HOSPITAL_COMMUNITY): Payer: Self-pay

## 2017-05-18 ENCOUNTER — Other Ambulatory Visit: Payer: Self-pay | Admitting: Emergency Medicine

## 2017-05-18 ENCOUNTER — Ambulatory Visit (HOSPITAL_BASED_OUTPATIENT_CLINIC_OR_DEPARTMENT_OTHER)
Admission: RE | Admit: 2017-05-18 | Discharge: 2017-05-18 | Disposition: A | Discharge status: Home / Self Care | Payer: Medicare Other | Source: Ambulatory Visit | Attending: Urology | Admitting: Urology

## 2017-05-18 ENCOUNTER — Ambulatory Visit (HOSPITAL_COMMUNITY)
Admission: RE | Admit: 2017-05-18 | Discharge: 2017-05-18 | Disposition: A | Discharge status: Home / Self Care | Payer: Medicare Other | Source: Ambulatory Visit | Attending: Urology | Admitting: Urology

## 2017-05-18 DIAGNOSIS — I2510 Atherosclerotic heart disease of native coronary artery without angina pectoris: Secondary | ICD-10-CM | POA: Insufficient documentation

## 2017-05-18 DIAGNOSIS — C3491 Malignant neoplasm of unspecified part of right bronchus or lung: Secondary | ICD-10-CM | POA: Insufficient documentation

## 2017-05-18 DIAGNOSIS — R918 Other nonspecific abnormal finding of lung field: Secondary | ICD-10-CM | POA: Diagnosis not present

## 2017-05-18 DIAGNOSIS — I700 Atherosclerosis of aorta: Secondary | ICD-10-CM | POA: Diagnosis not present

## 2017-05-18 DIAGNOSIS — J439 Emphysema, unspecified: Secondary | ICD-10-CM | POA: Insufficient documentation

## 2017-05-18 DIAGNOSIS — M47814 Spondylosis without myelopathy or radiculopathy, thoracic region: Secondary | ICD-10-CM | POA: Insufficient documentation

## 2017-05-18 DIAGNOSIS — C3411 Malignant neoplasm of upper lobe, right bronchus or lung: Secondary | ICD-10-CM | POA: Diagnosis present

## 2017-05-18 DIAGNOSIS — R911 Solitary pulmonary nodule: Secondary | ICD-10-CM | POA: Insufficient documentation

## 2017-05-18 DIAGNOSIS — Z85118 Personal history of other malignant neoplasm of bronchus and lung: Secondary | ICD-10-CM | POA: Insufficient documentation

## 2017-05-18 DIAGNOSIS — J479 Bronchiectasis, uncomplicated: Secondary | ICD-10-CM | POA: Insufficient documentation

## 2017-05-18 LAB — BUN AND CREATININE (CC13)
BUN: 17.3 mg/dL (ref 7.0–26.0)
CREATININE: 1.4 mg/dL — AB (ref 0.7–1.3)
EGFR: 61 mL/min/{1.73_m2} — ABNORMAL LOW (ref >90)

## 2017-05-18 MED ORDER — IOPAMIDOL (ISOVUE-300) INJECTION 61%
75.0000 mL | Freq: Once | INTRAVENOUS | Status: AC | PRN
  Administered 2017-05-18: 75 mL via INTRAVENOUS

## 2017-05-18 MED ORDER — IOPAMIDOL (ISOVUE-300) INJECTION 61%
INTRAVENOUS | Status: AC
  Filled 2017-05-18: qty 75

## 2017-05-18 MED ORDER — IOPAMIDOL (ISOVUE-300) INJECTION 61%: 75.0000 mL | Freq: Once | INTRAVENOUS | Status: DC | PRN

## 2017-05-18 MED ORDER — IOPAMIDOL (ISOVUE-300) INJECTION 61%
INTRAVENOUS | Status: DC
Start: 2017-05-18 — End: 2017-05-19
  Filled 2017-05-18: qty 75

## 2017-05-21 ENCOUNTER — Telehealth: Payer: Self-pay | Admitting: Urology

## 2017-05-21 NOTE — Telephone Encounter (Signed)
I called the patient to review results from his recent follow-up CT chest scan which does show a good treatment response with decrease in size of the right upper lobe lesion which was treated. There is no evidence of disease progression or metastatic disease within the chest. He will continue his follow-up under the care and direction of Dr. Julien Nordmann with his next scheduled appointment being 07/08/2017. He knows to call at any time with any questions or concerns related to his previous radiotherapy.   Nicholos Johns, PA-C

## 2017-05-25 ENCOUNTER — Ambulatory Visit (INDEPENDENT_AMBULATORY_CARE_PROVIDER_SITE_OTHER): Payer: Medicare Other | Admitting: Internal Medicine

## 2017-05-25 ENCOUNTER — Encounter: Payer: Self-pay | Admitting: Internal Medicine

## 2017-05-25 ENCOUNTER — Other Ambulatory Visit (INDEPENDENT_AMBULATORY_CARE_PROVIDER_SITE_OTHER): Payer: Medicare Other

## 2017-05-25 ENCOUNTER — Ambulatory Visit (INDEPENDENT_AMBULATORY_CARE_PROVIDER_SITE_OTHER)
Admission: RE | Admit: 2017-05-25 | Discharge: 2017-05-25 | Disposition: A | Discharge status: Home / Self Care | Payer: Medicare Other | Source: Ambulatory Visit | Attending: Internal Medicine | Admitting: Internal Medicine

## 2017-05-25 VITALS — BP 100/64 | HR 69 | Temp 98.6°F | Ht 71.0 in | Wt 268.0 lb

## 2017-05-25 DIAGNOSIS — M25461 Effusion, right knee: Secondary | ICD-10-CM | POA: Diagnosis not present

## 2017-05-25 DIAGNOSIS — M25561 Pain in right knee: Secondary | ICD-10-CM | POA: Diagnosis not present

## 2017-05-25 DIAGNOSIS — R55 Syncope and collapse: Secondary | ICD-10-CM

## 2017-05-25 DIAGNOSIS — S0093XA Contusion of unspecified part of head, initial encounter: Secondary | ICD-10-CM

## 2017-05-25 DIAGNOSIS — R040 Epistaxis: Secondary | ICD-10-CM | POA: Diagnosis not present

## 2017-05-25 DIAGNOSIS — E1159 Type 2 diabetes mellitus with other circulatory complications: Secondary | ICD-10-CM

## 2017-05-25 DIAGNOSIS — S0990XA Unspecified injury of head, initial encounter: Secondary | ICD-10-CM | POA: Diagnosis not present

## 2017-05-25 DIAGNOSIS — R0781 Pleurodynia: Secondary | ICD-10-CM

## 2017-05-25 DIAGNOSIS — I25709 Atherosclerosis of coronary artery bypass graft(s), unspecified, with unspecified angina pectoris: Secondary | ICD-10-CM

## 2017-05-25 DIAGNOSIS — S8991XA Unspecified injury of right lower leg, initial encounter: Secondary | ICD-10-CM | POA: Diagnosis not present

## 2017-05-25 DIAGNOSIS — S299XXA Unspecified injury of thorax, initial encounter: Secondary | ICD-10-CM | POA: Diagnosis not present

## 2017-05-25 LAB — CBC WITH DIFFERENTIAL/PLATELET
Basophils Absolute: 0.1 10*3/uL (ref 0.0–0.1)
Basophils Relative: 0.6 % (ref 0.0–3.0)
EOS PCT: 1.8 % (ref 0.0–5.0)
Eosinophils Absolute: 0.2 10*3/uL (ref 0.0–0.7)
HEMATOCRIT: 49.6 % (ref 39.0–52.0)
HEMOGLOBIN: 16.1 g/dL (ref 13.0–17.0)
Lymphocytes Relative: 20.6 % (ref 12.0–46.0)
Lymphs Abs: 1.8 10*3/uL (ref 0.7–4.0)
MCHC: 32.5 g/dL (ref 30.0–36.0)
MCV: 95.3 fl (ref 78.0–100.0)
MONO ABS: 1.2 10*3/uL — AB (ref 0.1–1.0)
Monocytes Relative: 13.7 % — ABNORMAL HIGH (ref 3.0–12.0)
Neutro Abs: 5.4 10*3/uL (ref 1.4–7.7)
Neutrophils Relative %: 63.3 % (ref 43.0–77.0)
Platelets: 164 10*3/uL (ref 150.0–400.0)
RBC: 5.2 Mil/uL (ref 4.22–5.81)
RDW: 15.4 % (ref 11.5–15.5)
WBC: 8.5 10*3/uL (ref 4.0–10.5)

## 2017-05-25 LAB — LIPID PANEL
CHOLESTEROL: 97 mg/dL (ref 0–200)
HDL: 31.1 mg/dL — ABNORMAL LOW (ref >39.00)
LDL Cholesterol: 46 mg/dL (ref 0–99)
NONHDL: 65.85
Total CHOL/HDL Ratio: 3
Triglycerides: 98 mg/dL (ref 0.0–149.0)
VLDL: 19.6 mg/dL (ref 0.0–40.0)

## 2017-05-25 LAB — HEMOGLOBIN A1C: HEMOGLOBIN A1C: 7.2 % — AB (ref 4.6–6.5)

## 2017-05-25 LAB — URINALYSIS, ROUTINE W REFLEX MICROSCOPIC
Bilirubin Urine: NEGATIVE
Hgb urine dipstick: NEGATIVE
KETONES UR: NEGATIVE
LEUKOCYTES UA: NEGATIVE
NITRITE: NEGATIVE
Specific Gravity, Urine: 1.01 (ref 1.000–1.030)
TOTAL PROTEIN, URINE-UPE24: NEGATIVE
URINE GLUCOSE: NEGATIVE
UROBILINOGEN UA: 0.2 (ref 0.0–1.0)
pH: 6 (ref 5.0–8.0)

## 2017-05-25 LAB — BASIC METABOLIC PANEL
BUN: 16 mg/dL (ref 6–23)
CALCIUM: 9.5 mg/dL (ref 8.4–10.5)
CO2: 29 meq/L (ref 19–32)
Chloride: 103 mEq/L (ref 96–112)
Creatinine, Ser: 1.43 mg/dL (ref 0.40–1.50)
GFR: 63.42 mL/min (ref >60.00)
GLUCOSE: 107 mg/dL — AB (ref 70–99)
POTASSIUM: 3.7 meq/L (ref 3.5–5.1)
SODIUM: 142 meq/L (ref 135–145)

## 2017-05-25 LAB — HEPATIC FUNCTION PANEL
ALBUMIN: 4.4 g/dL (ref 3.5–5.2)
ALT: 13 U/L (ref 0–53)
AST: 14 U/L (ref 0–37)
Alkaline Phosphatase: 48 U/L (ref 39–117)
Bilirubin, Direct: 0.2 mg/dL (ref 0.0–0.3)
TOTAL PROTEIN: 7.5 g/dL (ref 6.0–8.3)
Total Bilirubin: 0.6 mg/dL (ref 0.2–1.2)

## 2017-05-25 LAB — TSH: TSH: 1.78 u[IU]/mL (ref 0.35–4.50)

## 2017-05-25 NOTE — Assessment & Plan Note (Signed)
Lab Results  Component Value Date   HGBA1C 7.2 (H) 05/25/2017  stable overall by history and exam, recent data reviewed with pt, and pt to continue medical treatment as before,  to f/u any worsening symptoms or concerns

## 2017-05-25 NOTE — Assessment & Plan Note (Addendum)
Post traumatic, cannot r/o hemarthrosis on xarelto but will need plain films prior to assess for fx

## 2017-05-25 NOTE — Assessment & Plan Note (Signed)
Doubt fx, for films to assess, for pain control

## 2017-05-25 NOTE — Assessment & Plan Note (Addendum)
Likely cough related, has echo f/u later this wk, also for labs as ordered, to f/u any worsening symptoms or concerns

## 2017-05-25 NOTE — Progress Notes (Signed)
Subjective:    Patient ID: Neil Mam., male    DOB: 12/29/1950, 66 y.o.   MRN: [id number]  HPI  Here after an unfortunate fall 9/3 while walking in the hallway at home, when he had just coughed harshly, became dizziness and thinks may have passed out for 1 second b/c he only recalls falling to the right chest on the wall when he tried to grab something and missed, and does not recall further when apparently he hit the floor with the right knee (now with abrasion and swelling), as well as abrasion and bruising to right forehead.  No active bleeding but did have recent right sided nosebleed small volume  On chronic xarelto.  Has hx of similar episode several months ago.   Has echo ordered for later this wk with ongoing cardiology f/u.  Pt denies new neurological symptoms such as new facial or extremity weakness or numbness   Pt denies polydipsia, polyuria  No fever, ST, cough or dysuria Past Medical History:  Diagnosis Date  . Adenomatous colon polyp   . Aortic insufficiency    Echo 3/18: Severe concentric LVH, EF 60-65, normal wall motion, moderate AI, mild LAE, mild TR  . Arthritis    left hip replacement  . Atrial flutter ([place])    onset  2011. s/p EPS/RFA 12/2011  . Cataract   . Chronic bronchitis   . Contact dermatitis and other eczema due to plants (except food)   . COPD (chronic obstructive pulmonary disease) ([place])   . GERD (gastroesophageal reflux disease)   . Hyperlipidemia   . Hypertension   . Lung cancer ([place])   . LV dysfunction    EF 45-50% 12/2011  . OSA (obstructive sleep apnea) 10/13/2016   Mild with AHI 9.7/hr with significant oxygen desaturations as low as 76% now on CPAP  . Other peripheral vascular disease(443.89)    bilateral lower extremity  . Tobacco use disorder    dependent  . Transient global amnesia   . Tuberculosis   . Type II diabetes mellitus ([place])    Past Surgical History:  Procedure Laterality Date  . A FLUTTER ABLATION N/A 12/28/2011   Procedure: ABLATION A FLUTTER;  Surgeon: Evans Lance, MD;  Location: [facility] CATH LAB;  Service: Cardiovascular;  Laterality: N/A;  . CARDIAC CATHETERIZATION N/A 07/10/2015   Procedure: Left Heart Cath and Coronary Angiography;  Surgeon: Sherren Mocha, MD;  Location: [facility] CV LAB;  Service: Cardiovascular;  Laterality: N/A;  . CARDIAC ELECTROPHYSIOLOGY MAPPING AND ABLATION  03/2010  . CARDIOVERSION N/A 07/13/2016   Procedure: CARDIOVERSION;  Surgeon: Skeet Latch, MD;  Location: [facility];  Service: Cardiovascular;  Laterality: N/A;  . COLONOSCOPY    . colonoscopy with polypectomy  2006 & 2011   Dr [name]  . CYSTOSCOPY  11/2007   Dr Jeffie Pollock  . PARTIAL HIP ARTHROPLASTY Right 01/2000   "Right; replaced ball & stem"  . PARTIAL HIP ARTHROPLASTY Left   . POLYPECTOMY    . TEE WITHOUT CARDIOVERSION  12/28/2011   Procedure: TRANSESOPHAGEAL ECHOCARDIOGRAM (TEE);  Surgeon: Larey Dresser, MD;  Location[facility] ENDOSCOPY;  Service: Cardiovascular;  Laterality: N/A;    reports that he has been smoking Cigarettes.  He has a 44.00 pack-year smoking history. He has never used smokeless tobacco. He reports that he does not drink alcohol or use drugs. family history includes Crohn's disease in his son; Diabetes in his brother, brother, mother, and paternal grandmother; Heart attack (age of onset: 55) in  his brother; Heart attack (age of onset: 104) in his brother; Hepatitis C in his brother; Hypertension in his mother; Lung cancer in his father; Stroke (age of onset: 67) in his mother; Throat cancer in his paternal uncle. Allergies  Allergen Reactions  . Niacin Nausea Only    REACTION: upset stomach   Current Outpatient Prescriptions on File Prior to Visit  Medication Sig Dispense Refill  . acetaminophen (TYLENOL) 650 MG CR tablet Take 650 mg by mouth every 8 (eight) hours as needed for pain.    . cilostazol (PLETAL) 100 MG tablet Take 1 tablet (100 mg total) by mouth 2 (two) times daily. 180 tablet 1    . clobetasol (OLUX) 0.05 % topical foam Apply 1 application topically 2 (two) times daily as needed.   0  . collagenase (SANTYL) ointment Apply 1 application topically daily as needed.     . diltiazem (CARTIA XT) 180 MG 24 hr capsule Take 1 capsule (180 mg total) by mouth 2 (two) times daily. 180 capsule 3  . etanercept (ENBREL SURECLICK) 50 MG/ML injection Inject 50 mg into the skin 2 (two) times a week. Inject on Sundays & Wednesdays    . Flaxseed, Linseed, (FLAX SEED OIL) 1000 MG CAPS Take 1 capsule by mouth 2 (two) times daily.     . Fluticasone-Salmeterol (ADVAIR) 100-50 MCG/DOSE AEPB Inhale 1 puff into the lungs 2 (two) times daily. Need office visit for refills 60 each 6  . furosemide (LASIX) 40 MG tablet take 2 tablets by mouth twice a day 360 tablet 3  . insulin detemir (LEVEMIR) 100 UNIT/ML injection Inject 28 Units into the skin at bedtime.     . metFORMIN (GLUCOPHAGE) 1000 MG tablet Take 1,000 mg by mouth 2 (two) times daily with a meal.    . metolazone (ZAROXOLYN) 2.5 MG tablet Take 1 tablet (2.5 mg total) by mouth as directed. TAKE 1 TABLET TWICE A WEEK 30 tablet 3  . metoprolol (LOPRESSOR) 100 MG tablet Take 100-150 mg by mouth See admin instructions. 1 tablet every morning And take 1 and 1/2 tablets by mouth every evening    . Multiple Vitamins-Minerals (CENTRUM ADULTS) TABS Take 1 tablet by mouth daily.    . mupirocin ointment (BACTROBAN) 2 % Apply 1 application topically 2 (two) times daily as needed (affected areas).     . NOVOFINE 32G X 6 MM MISC use twice a day 100 each 5  . Omega-3 Fatty Acids (FISH OIL) 1000 MG CAPS Take 2 capsules by mouth 2 (two) times daily.     Glory Rosebush DELICA LANCETS FINE MISC TEST BLOOD SUGAR DAILY 200 each 12  . potassium chloride SA (K-DUR,KLOR-CON) 20 MEQ tablet take 1 tablet by mouth twice a day 180 tablet 3  . rivaroxaban (XARELTO) 20 MG TABS tablet Take 20 mg by mouth every morning.     . rosuvastatin (CRESTOR) 10 MG tablet Take 1 tablet (10  mg total) by mouth daily. 90 tablet 3  . tamsulosin (FLOMAX) 0.4 MG CAPS capsule Take 0.4 mg by mouth daily.     [name] triamcinolone cream (KENALOG) 0.1 % Apply 1 application topically 2 (two) times daily as needed (affected area).     [name] VICTOZA 18 MG/3ML SOPN INJECT 0.3ML (=1.8MG )      SUBCUTANEOUSLY DAILY 27 mL 1  . losartan (COZAAR) 25 MG tablet Take 1 tablet (25 mg total) by mouth daily. 90 tablet 3   No current facility-administered medications on file prior to visit.  Review of Systems  Constitutional: Negative for other unusual diaphoresis or sweats HENT: Negative for ear discharge or swelling Eyes: Negative for other worsening visual disturbances Respiratory: Negative for stridor or other swelling  Gastrointestinal: Negative for worsening distension or other blood Genitourinary: Negative for retention or other urinary change Musculoskeletal: Negative for other MSK pain or swelling Skin: Negative for color change or other new lesions Neurological: Negative for worsening tremors and other numbness  Psychiatric/Behavioral: Negative for worsening agitation or other fatigue All other system neg per pt    Objective:   Physical Exam BP 100/64   Pulse 69   Temp 98.6 F (37 C) (Oral)   Ht 5\' 11"  (1.803 m)   Wt 268 lb (121.6 kg)   SpO2 94%   BMI 37.38 kg/m  VS noted, not ill appearing Constitutional: Pt appears in NAD HENT: Head: NCAT.  Right Ear: External ear normal.  Left Ear: External ear normal.  Eyes: . Pupils are equal, round, and reactive to light. Conjunctivae and EOM are normal Nose: without d/c or deformity Neck: Neck supple. Gross normal ROM Cardiovascular: Normal rate and regular rhythm.   Pulmonary/Chest: Effort normal and breath sounds without rales or wheezing.  MSK:  Mid right lateral chest wall tender without swelling or rash at about the t5-6 anterior axillary line\Right knee with 1+ effusion Abd:  Soft, NT, ND, + BS, no organomegaly Neurological: Pt is  alert. At baseline orientation, motor grossly intact, sens intact to LT, cn 2-12 intact Skin: Skin is warm. No rashes, other new lesions, no LE edema except for 2 cm abrasion to right patellar knee and bruising/abrasion to right forehead Psychiatric: Pt behavior is normal without agitation  No other exam findings    Assessment & Plan:

## 2017-05-25 NOTE — Assessment & Plan Note (Addendum)
Mild single episode, small volume, for cbc but not expected to be anemic due to this, may be an element in the acute on chronic cough and subsequent possible cough syncope  Note:  Total time for pt hx, exam, review of record with pt in the room, determin ation of diagnoses and plan for further eval and tx is > 40 min, with over 50% spent in coordination and counseling of patient including the differential dx, tx, further eval and other management of nosebleed, cough, syncope, head contusion, right rib pain, right knee effusion and DM

## 2017-05-25 NOTE — Assessment & Plan Note (Signed)
Mild, but with head trauma on xarelto will need stat head CT r/o SDH

## 2017-05-25 NOTE — Patient Instructions (Signed)
Please continue all other medications as before, and refills have been done if requested.  Please have the pharmacy call with any other refills you may need.  Please continue your efforts at being more active, low cholesterol diet diabetic and weight control..  Please keep your appointments with your specialists as you may have planned  You will be contacted regarding the referral for: Head CT - stat - to see [facility] now  Please go to the XRAY Department in the Basement (go straight as you get off the elevator) for the x-ray testing - rib and cxr and right knee films  Please go to the LAB in the Basement (turn left off the elevator) for the tests to be done today  You will be contacted by phone if any changes need to be made immediately.  Otherwise, you will receive a letter about your results with an explanation, but please check with MyChart first.  Please remember to sign up for MyChart if you have not done so, as this will be important to you in the future with finding out test results, communicating by private email, and scheduling acute appointments online when needed.

## 2017-05-26 ENCOUNTER — Telehealth: Payer: Self-pay

## 2017-05-26 NOTE — Telephone Encounter (Signed)
-----   Message from Biagio Borg, MD sent at 05/25/2017  7:34 PM EDT ----- Madaline Brilliant for [name] to contact pt - to let him know his CXR, rib films,a nd right knee xray were all negative for fracture or other new problems

## 2017-05-26 NOTE — Telephone Encounter (Signed)
Pt has been informed that Xrays and CT was negative. He expressed understanding.

## 2017-05-28 ENCOUNTER — Other Ambulatory Visit: Payer: Self-pay

## 2017-05-28 ENCOUNTER — Encounter: Payer: Self-pay | Admitting: Physician Assistant

## 2017-05-28 ENCOUNTER — Telehealth: Payer: Self-pay | Admitting: *Deleted

## 2017-05-28 ENCOUNTER — Ambulatory Visit (HOSPITAL_COMMUNITY): Payer: Medicare Other | Attending: Cardiology

## 2017-05-28 DIAGNOSIS — G4733 Obstructive sleep apnea (adult) (pediatric): Secondary | ICD-10-CM | POA: Diagnosis not present

## 2017-05-28 DIAGNOSIS — I119 Hypertensive heart disease without heart failure: Secondary | ICD-10-CM | POA: Diagnosis not present

## 2017-05-28 DIAGNOSIS — I4892 Unspecified atrial flutter: Secondary | ICD-10-CM | POA: Diagnosis not present

## 2017-05-28 DIAGNOSIS — I350 Nonrheumatic aortic (valve) stenosis: Secondary | ICD-10-CM | POA: Diagnosis present

## 2017-05-28 DIAGNOSIS — E119 Type 2 diabetes mellitus without complications: Secondary | ICD-10-CM | POA: Diagnosis not present

## 2017-05-28 DIAGNOSIS — Z720 Tobacco use: Secondary | ICD-10-CM | POA: Diagnosis not present

## 2017-05-28 DIAGNOSIS — I348 Other nonrheumatic mitral valve disorders: Secondary | ICD-10-CM | POA: Diagnosis not present

## 2017-05-28 DIAGNOSIS — J449 Chronic obstructive pulmonary disease, unspecified: Secondary | ICD-10-CM | POA: Insufficient documentation

## 2017-05-28 DIAGNOSIS — I351 Nonrheumatic aortic (valve) insufficiency: Secondary | ICD-10-CM

## 2017-05-28 DIAGNOSIS — E785 Hyperlipidemia, unspecified: Secondary | ICD-10-CM | POA: Insufficient documentation

## 2017-05-28 NOTE — Telephone Encounter (Signed)
Tried to reach pt to go over his Limited Echo results, though no answer and no machine came on to lm.

## 2017-05-28 NOTE — Telephone Encounter (Signed)
-----   Message from Liliane Shi, [place] sent at 05/28/2017 12:46 PM EDT ----- Please call the patient. The echocardiogram demonstrates normal LV function. Leakage of the aortic valve is better on this study than the previous one. Continue current medications and follow-up as planned. Please fax a copy of this study result to his PCP:  Hoyt Koch, MD  Thanks! Richardson Dopp, PA-C    05/28/2017 12:44 PM

## 2017-06-01 NOTE — Telephone Encounter (Signed)
-----   Message from Liliane Shi, [place] sent at 05/28/2017 12:46 PM EDT ----- Please call the patient. The echocardiogram demonstrates normal LV function. Leakage of the aortic valve is better on this study than the previous one. Continue current medications and follow-up as planned. Please fax a copy of this study result to his PCP:  Hoyt Koch, MD  Thanks! Richardson Dopp, PA-C    05/28/2017 12:44 PM

## 2017-06-01 NOTE — Telephone Encounter (Signed)
Pt has been notified of Limited Echo results by phone with verbal understanding/findings by phone with verbal understanding. Pt thanked me for my call today.

## 2017-06-07 ENCOUNTER — Telehealth: Payer: Self-pay | Admitting: Endocrinology

## 2017-06-07 NOTE — Telephone Encounter (Signed)
He has completed all labs at [facility] on [address]- are there any other labs you would need please advise

## 2017-06-07 NOTE — Telephone Encounter (Signed)
Yes, he should have been scheduled for labs anyway

## 2017-06-07 NOTE — Telephone Encounter (Signed)
Please advise 

## 2017-06-07 NOTE — Telephone Encounter (Signed)
Patient called in reference to wanting to know if he needs to come in for labs prior to his appointment on 06/10/17. Please call patient and advise. OK to leave message.

## 2017-06-07 NOTE — Telephone Encounter (Signed)
He can do urine microalbumin when he comes in for his office visit

## 2017-06-08 NOTE — Telephone Encounter (Signed)
Patient is aware of the note below

## 2017-06-09 NOTE — Progress Notes (Signed)
Patient ID: Neil Carroll., male   DOB: [birth year], 66 y.o.   MRN: [id number]   Reason for Appointment : Followup for Type 2 Diabetes  History of Present Illness          Diagnosis: Type 2 diabetes mellitus, date of diagnosis:  2008     Past history: He has been treated with metformin only for several years and had relatively good control until about a year ago. However had been taking only 1000 mg of metformin. Because of his progressively higher blood sugars he had been started on Levemir insulin since 6/14.  However with this his A1c was still 10.2 in 9/14 and he was referred here With increasing Victoza to 1.8 mg in 3/15 his weight had come down and his blood sugars had improved   Recent history:   INSULIN regimen is described as:  Levemir 28 units hs Non-insulin hypoglycemic drugs the patient is taking are: Metformin 2g, Victoza 1.8 mg daily  His A1c is improved at 7.2, previously was at 7.6  Current management, blood sugar patterns and problems identified:  He has checked blood sugars again very infrequently  Levemir was increased by 2 units on his last visit because of relatively higher fasting readings  However he has only a couple of readings recently in the morning, one of them after coffee  He has only one relatively high reading at bedtime but otherwise blood sugars are looking fairly good with only about 8 or 9 nonfasting readings over the last month  He has finally started losing a little weight and is doing better with his overall caloric intake  Still not able to to exercise because of fatigue and shortness of breath   Side effects from medications have been: None Compliance with the medical regimen: Fair   Glucose monitoring: Irregular        Glucometer: One Touch.      Blood Glucose readings from download:  Mean values apply above for all meters except median for One Touch  PRE-MEAL Fasting Lunch Dinner Bedtime Overall  Glucose  range:  139, 161  120  101, 140  115-196    Mean/median:     136   Hypoglycemia frequency: Never.           Self-care:   Meals: 2/day. 11 am 6 pm.  He is eating out at times, more salads Oatmeal in am Physical activity: exercise:  Doing minimal      Dietician visit: Most recent: 09/2013                 Wt Readings from Last 3 Encounters:  06/10/17 267 lb 9.6 oz (121.4 kg)  05/25/17 268 lb (121.6 kg)  05/11/17 273 lb (123.8 kg)       Lab Results  Component Value Date   HGBA1C 7.2 (H) 05/25/2017   HGBA1C 7.6 (H) 03/09/2017   HGBA1C 7.1 12/07/2016   Lab Results  Component Value Date   MICROALBUR 3.5 (H) 04/27/2016   LDLCALC 46 05/25/2017   CREATININE 1.43 05/25/2017   No visits with results within 1 Week(s) from this visit.  Latest known visit with results is:  Appointment on 05/25/2017  Component Date Value Ref Range Status  . Hgb A1c MFr Bld 05/25/2017 7.2* 4.6 - 6.5 % Final   Glycemic Control Guidelines for People with Diabetes:Non Diabetic:  <6%Goal of Therapy: <7%Additional Action Suggested:  >8%   . Cholesterol 05/25/2017  97  0 - 200 mg/dL Final   ATP III Classification       Desirable:  < 200 mg/dL               Borderline High:  200 - 239 mg/dL          High:  > = 240 mg/dL  . Triglycerides 05/25/2017 98.0  0.0 - 149.0 mg/dL Final   Normal:  <150 mg/dLBorderline High:  150 - 199 mg/dL  . HDL 05/25/2017 31.10* >39.00 mg/dL Final  . VLDL 05/25/2017 19.6  0.0 - 40.0 mg/dL Final  . LDL Cholesterol 05/25/2017 46  0 - 99 mg/dL Final  . Total CHOL/HDL Ratio 05/25/2017 3   Final                  Men          Women1/2 Average Risk     3.4          3.3Average Risk          5.0          4.42X Average Risk          9.6          7.13X Average Risk          15.0          11.0                      . NonHDL 05/25/2017 65.85   Final   NOTE:  Non-HDL goal should be 30 mg/dL higher than patient's LDL goal (i.e. LDL goal of < 70 mg/dL, would have non-HDL goal of < 100 mg/dL)  .  Sodium 05/25/2017 142  135 - 145 mEq/L Final  . Potassium 05/25/2017 3.7  3.5 - 5.1 mEq/L Final  . Chloride 05/25/2017 103  96 - 112 mEq/L Final  . CO2 05/25/2017 29  19 - 32 mEq/L Final  . Glucose, Bld 05/25/2017 107* 70 - 99 mg/dL Final  . BUN 05/25/2017 16  6 - 23 mg/dL Final  . Creatinine, Ser 05/25/2017 1.43  0.40 - 1.50 mg/dL Final  . Calcium 05/25/2017 9.5  8.4 - 10.5 mg/dL Final  . GFR 05/25/2017 63.42  >60.00 mL/min Final  . Total Bilirubin 05/25/2017 0.6  0.2 - 1.2 mg/dL Final  . Bilirubin, Direct 05/25/2017 0.2  0.0 - 0.3 mg/dL Final  . Alkaline Phosphatase 05/25/2017 48  39 - 117 U/L Final  . AST 05/25/2017 14  0 - 37 U/L Final  . ALT 05/25/2017 13  0 - 53 U/L Final  . Total Protein 05/25/2017 7.5  6.0 - 8.3 g/dL Final  . Albumin 05/25/2017 4.4  3.5 - 5.2 g/dL Final  . WBC 05/25/2017 8.5  4.0 - 10.5 K/uL Final  . RBC 05/25/2017 5.20  4.22 - 5.81 Mil/uL Final  . Hemoglobin 05/25/2017 16.1  13.0 - 17.0 g/dL Final  . HCT 05/25/2017 49.6  39.0 - 52.0 % Final  . MCV 05/25/2017 95.3  78.0 - 100.0 fl Final  . MCHC 05/25/2017 32.5  30.0 - 36.0 g/dL Final  . RDW 05/25/2017 15.4  11.5 - 15.5 % Final  . Platelets 05/25/2017 164.0  150.0 - 400.0 K/uL Final  . Neutrophils Relative % 05/25/2017 63.3  43.0 - 77.0 % Final  . Lymphocytes Relative 05/25/2017 20.6  12.0 - 46.0 % Final  . Monocytes Relative 05/25/2017 13.7* 3.0 - 12.0 % Final  . Eosinophils Relative 05/25/2017  1.8  0.0 - 5.0 % Final  . Basophils Relative 05/25/2017 0.6  0.0 - 3.0 % Final  . Neutro Abs 05/25/2017 5.4  1.4 - 7.7 K/uL Final  . Lymphs Abs 05/25/2017 1.8  0.7 - 4.0 K/uL Final  . Monocytes Absolute 05/25/2017 1.2* 0.1 - 1.0 K/uL Final  . Eosinophils Absolute 05/25/2017 0.2  0.0 - 0.7 K/uL Final  . Basophils Absolute 05/25/2017 0.1  0.0 - 0.1 K/uL Final  . TSH 05/25/2017 1.78  0.35 - 4.50 uIU/mL Final  . Color, Urine 05/25/2017 YELLOW  Yellow;Lt. Yellow Final  . APPearance 05/25/2017 CLEAR  Clear Final  .  Specific Gravity, Urine 05/25/2017 1.010  1.000 - 1.030 Final  . pH 05/25/2017 6.0  5.0 - 8.0 Final  . Total Protein, Urine 05/25/2017 NEGATIVE  Negative Final  . Urine Glucose 05/25/2017 NEGATIVE  Negative Final  . Ketones, ur 05/25/2017 NEGATIVE  Negative Final  . Bilirubin Urine 05/25/2017 NEGATIVE  Negative Final  . Hgb urine dipstick 05/25/2017 NEGATIVE  Negative Final  . Urobilinogen, UA 05/25/2017 0.2  0.0 - 1.0 Final  . Leukocytes, UA 05/25/2017 NEGATIVE  Negative Final  . Nitrite 05/25/2017 NEGATIVE  Negative Final  . WBC, UA 05/25/2017 0-2/hpf  0-2/hpf Final  . RBC / HPF 05/25/2017 0-2/hpf  0-2/hpf Final  . Squamous Epithelial / LPF 05/25/2017 Rare(0-4/hpf)  Rare(0-4/hpf) Final  . Hyaline Casts, UA 05/25/2017 Presence of* None Final     Allergies as of 06/10/2017      Reactions   Niacin Nausea Only   REACTION: upset stomach      Medication List       Accurate as of 06/10/17 10:42 AM. Always use your most recent med list.          acetaminophen 650 MG CR tablet Commonly known as:  TYLENOL Take 650 mg by mouth every 8 (eight) hours as needed for pain.   CENTRUM ADULTS Tabs Take 1 tablet by mouth daily.   cilostazol 100 MG tablet Commonly known as:  PLETAL Take 1 tablet (100 mg total) by mouth 2 (two) times daily.   clobetasol 0.05 % topical foam Commonly known as:  OLUX Apply 1 application topically 2 (two) times daily as needed.   collagenase ointment Commonly known as:  SANTYL Apply 1 application topically daily as needed.   diltiazem 180 MG 24 hr capsule Commonly known as:  CARTIA XT Take 1 capsule (180 mg total) by mouth 2 (two) times daily.   ENBREL SURECLICK 50 MG/ML injection Generic drug:  etanercept Inject 50 mg into the skin 2 (two) times a week. Inject on Sundays & Wednesdays   Fish Oil 1000 MG Caps Take 2 capsules by mouth 2 (two) times daily.   Flax Seed Oil 1000 MG Caps Take 1 capsule by mouth 2 (two) times daily.     Fluticasone-Salmeterol 100-50 MCG/DOSE Aepb Commonly known as:  ADVAIR Inhale 1 puff into the lungs 2 (two) times daily. Need office visit for refills   furosemide 40 MG tablet Commonly known as:  LASIX take 2 tablets by mouth twice a day   LEVEMIR 100 UNIT/ML injection Generic drug:  insulin detemir Inject 28 Units into the skin at bedtime.   losartan 25 MG tablet Commonly known as:  COZAAR Take 1 tablet (25 mg total) by mouth daily.   metFORMIN 1000 MG tablet Commonly known as:  GLUCOPHAGE Take 1,000 mg by mouth 2 (two) times daily with a meal.   metolazone 2.5 MG tablet  Commonly known as:  ZAROXOLYN Take 1 tablet (2.5 mg total) by mouth as directed. TAKE 1 TABLET TWICE A WEEK   metoprolol tartrate 100 MG tablet Commonly known as:  LOPRESSOR Take 100-150 mg by mouth See admin instructions. 1 tablet every morning And take 1 and 1/2 tablets by mouth every evening   mupirocin ointment 2 % Commonly known as:  BACTROBAN Apply 1 application topically 2 (two) times daily as needed (affected areas).   NOVOFINE 32G X 6 MM Misc Generic drug:  Insulin Pen Needle use twice a day   ONETOUCH DELICA LANCETS FINE Misc TEST BLOOD SUGAR DAILY   potassium chloride SA 20 MEQ tablet Commonly known as:  K-DUR,KLOR-CON take 1 tablet by mouth twice a day   rivaroxaban 20 MG Tabs tablet Commonly known as:  XARELTO Take 20 mg by mouth every morning.   rosuvastatin 10 MG tablet Commonly known as:  CRESTOR Take 1 tablet (10 mg total) by mouth daily.   tamsulosin 0.4 MG Caps capsule Commonly known as:  FLOMAX Take 0.4 mg by mouth daily.   triamcinolone cream 0.1 % Commonly known as:  KENALOG Apply 1 application topically 2 (two) times daily as needed (affected area).   VICTOZA 18 MG/3ML Sopn Generic drug:  liraglutide INJECT 0.3ML (=1.8MG)      SUBCUTANEOUSLY DAILY            Discharge Care Instructions        Start     Ordered   06/10/17 0000  Flu vaccine HIGH DOSE PF      06/10/17 1034      Allergies:  Allergies  Allergen Reactions  . Niacin Nausea Only    REACTION: upset stomach    Past Medical History:  Diagnosis Date  . Adenomatous colon polyp   . Aortic insufficiency    Echo 3/18: Severe concentric LVH, EF 60-65, normal wall motion, moderate AI, mild LAE, mild TR // Echo 9/18: Mild concentric LVH, EF 60-65, normal wall motion, trivial AI, MAC, mild BAE  . Arthritis    left hip replacement  . Atrial flutter ([place])    onset  2011. s/p EPS/RFA 12/2011  . Cataract   . Chronic bronchitis   . Contact dermatitis and other eczema due to plants (except food)   . COPD (chronic obstructive pulmonary disease) ([place])   . GERD (gastroesophageal reflux disease)   . Hyperlipidemia   . Hypertension   . Lung cancer ([place])   . LV dysfunction    EF 45-50% 12/2011  . OSA (obstructive sleep apnea) 10/13/2016   Mild with AHI 9.7/hr with significant oxygen desaturations as low as 76% now on CPAP  . Other peripheral vascular disease(443.89)    bilateral lower extremity  . Tobacco use disorder    dependent  . Transient global amnesia   . Tuberculosis   . Type II diabetes mellitus ([place])     Past Surgical History:  Procedure Laterality Date  . A FLUTTER ABLATION N/A 12/28/2011   Procedure: ABLATION A FLUTTER;  Surgeon: Evans Lance, MD;  Location: [facility] CATH LAB;  Service: Cardiovascular;  Laterality: N/A;  . CARDIAC CATHETERIZATION N/A 07/10/2015   Procedure: Left Heart Cath and Coronary Angiography;  Surgeon: Sherren Mocha, MD;  Location: [facility] CV LAB;  Service: Cardiovascular;  Laterality: N/A;  . CARDIAC ELECTROPHYSIOLOGY MAPPING AND ABLATION  03/2010  . CARDIOVERSION N/A 07/13/2016   Procedure: CARDIOVERSION;  Surgeon: Skeet Latch, MD;  Location: [facility];  Service: Cardiovascular;  Laterality:  N/A;  . COLONOSCOPY    . colonoscopy with polypectomy  2006 & 2011   Dr [name]  . CYSTOSCOPY  11/2007   Dr Jeffie Pollock  . PARTIAL HIP ARTHROPLASTY Right  01/2000   "Right; replaced ball & stem"  . PARTIAL HIP ARTHROPLASTY Left   . POLYPECTOMY    . TEE WITHOUT CARDIOVERSION  12/28/2011   Procedure: TRANSESOPHAGEAL ECHOCARDIOGRAM (TEE);  Surgeon: Larey Dresser, MD;  Location: [facility] ENDOSCOPY;  Service: Cardiovascular;  Laterality: N/A;    Family History  Problem Relation Age of Onset  . Stroke Mother 72  . Hypertension Mother   . Diabetes Mother   . Diabetes Paternal Grandmother   . Lung cancer Father        smoker  . Diabetes Brother   . Heart attack Brother 54  . Diabetes Brother   . Hepatitis C Brother   . Throat cancer Paternal Uncle        1/2 uncle  . Heart attack Brother 64  . Crohn's disease Son   . Colon cancer Neg Hx   . Esophageal cancer Neg Hx   . Rectal cancer Neg Hx   . Stomach cancer Neg Hx     Social History:  reports that he has been smoking Cigarettes.  He has a 44.00 pack-year smoking history. He has never used smokeless tobacco. He reports that he does not drink alcohol or use drugs.    Review of Systems       Lipids: He has  low HDL, LDL 48 ; this has been managed previously by PCP and cardiologis, taking Crestor 10 mg   Lab Results  Component Value Date   CHOL 97 05/25/2017   HDL 31.10 (L) 05/25/2017   LDLCALC 46 05/25/2017   TRIG 98.0 05/25/2017   CHOLHDL 3 05/25/2017              Has no numbness, tingling in the right first or second toes, no burning in feet  Diabetic foot exam in 12/17 showed normal monofilament sensation in the toes and plantar surfaces, no skin lesions or ulcers on the feet and absent pedal pulses  Hypertension: Blood pressure Appears well controlled  He has multiple cardiac and pulmonary issues and is using oxygen  Physical Examination:  BP 108/60 (BP Location: Left Arm, Patient Position: Sitting, Cuff Size: Normal)   Pulse 86   Temp 98.8 F (37.1 C) (Oral)   Ht 5' 11"  (1.803 m)   Wt 267 lb 9.6 oz (121.4 kg)   SpO2 95%   BMI 37.32 kg/m        ASSESSMENT:  Diabetes type 2, uncontrolled    See history of present illness for detailed discussion of current diabetes management, blood sugar patterns and problems identified  Currently on maximum dose of Victoza and metformin along with basal insulin  His control is relatively good with A1c 7.2 He also has a lot of comorbid conditions He has not checked his blood sugars consistently but did not see any consistently abnormal high readings  PLAN:  He does need to check his fasting blood sugar more consistently to help adjust his Levemir Also he will try to moderate on portions and carbohydrates and any given meal More blood sugar readings after meals He will try to walk a little on the treadmill as tolerated   Follow-up in 3 months     There are no Patient Instructions on file for this visit.    Neil Carroll 06/10/2017, 10:42 AM  Note: This office note was prepared with Dragon voice recognition system technology. Any transcriptional errors that result from this process are unintentional.  

## 2017-06-10 ENCOUNTER — Encounter: Payer: Self-pay | Admitting: Endocrinology

## 2017-06-10 ENCOUNTER — Ambulatory Visit (INDEPENDENT_AMBULATORY_CARE_PROVIDER_SITE_OTHER): Payer: Medicare Other | Admitting: Endocrinology

## 2017-06-10 VITALS — BP 108/60 | HR 86 | Temp 98.8°F | Ht 71.0 in | Wt 267.6 lb

## 2017-06-10 DIAGNOSIS — Z23 Encounter for immunization: Secondary | ICD-10-CM | POA: Diagnosis not present

## 2017-06-10 DIAGNOSIS — E1165 Type 2 diabetes mellitus with hyperglycemia: Secondary | ICD-10-CM | POA: Diagnosis not present

## 2017-06-10 DIAGNOSIS — I25709 Atherosclerosis of coronary artery bypass graft(s), unspecified, with unspecified angina pectoris: Secondary | ICD-10-CM | POA: Diagnosis not present

## 2017-06-10 DIAGNOSIS — Z794 Long term (current) use of insulin: Secondary | ICD-10-CM

## 2017-06-10 LAB — MICROALBUMIN / CREATININE URINE RATIO
Creatinine,U: 359.9 mg/dL
MICROALB UR: 3.4 mg/dL — AB (ref 0.0–1.9)
Microalb Creat Ratio: 0.9 mg/g (ref 0.0–30.0)

## 2017-06-21 ENCOUNTER — Ambulatory Visit (INDEPENDENT_AMBULATORY_CARE_PROVIDER_SITE_OTHER): Payer: Medicare Other | Admitting: Cardiovascular Disease

## 2017-06-21 ENCOUNTER — Encounter (INDEPENDENT_AMBULATORY_CARE_PROVIDER_SITE_OTHER): Payer: Self-pay

## 2017-06-21 ENCOUNTER — Encounter: Payer: Self-pay | Admitting: Cardiovascular Disease

## 2017-06-21 VITALS — BP 110/50 | HR 84 | Ht 71.0 in | Wt 262.8 lb

## 2017-06-21 DIAGNOSIS — I5032 Chronic diastolic (congestive) heart failure: Secondary | ICD-10-CM | POA: Diagnosis not present

## 2017-06-21 DIAGNOSIS — I25709 Atherosclerosis of coronary artery bypass graft(s), unspecified, with unspecified angina pectoris: Secondary | ICD-10-CM

## 2017-06-21 DIAGNOSIS — I481 Persistent atrial fibrillation: Secondary | ICD-10-CM

## 2017-06-21 DIAGNOSIS — I4819 Other persistent atrial fibrillation: Secondary | ICD-10-CM

## 2017-06-21 NOTE — Patient Instructions (Signed)
Medication Instructions:  1) STOP CILOSTAZOL (PLETAL)  Labwork: None  Testing/Procedures: None  Follow-Up: Your provider wants you to follow-up in: 6 months with Dr. Burt Knack. You will receive a reminder letter in the mail two months in advance. If you don't receive a letter, please call our office to schedule the follow-up appointment.    Any Other Special Instructions Will Be Listed Below (If Applicable).     If you need a refill on your cardiac medications before your next appointment, please call your pharmacy.

## 2017-06-21 NOTE — Progress Notes (Signed)
Cardiology Office Note Date:  06/22/2017   ID:  Neil Carroll., DOB June 09, 1951, MRN [id number]  PCP:  Hoyt Koch, MD  Cardiologist:  Sherren Mocha, MD    Chief Complaint  Patient presents with  . Follow-up    3 month     History of Present Illness: Neil Carroll. is a 66 y.o. male who presents for follow-up of multiple cardiovascular problems. His hx includes atrial fibrillation and flutter, CAD, PAD with bilateral SFA occlusion, diabetes, ongoing tobacco abuse, and chronic diastolic heart failure. He has been diagnosed with squamous cell lung cancer and was considered a poor operative candidate because of severe COPD And multiple comorbid conditions. He has been treated with radiation therapy. He was last seen by Richardson Dopp 04/12/17 and Dr Curt Bears 04/28/17. He was felt to be stable from a cardiac perspective at those visits.   He had a fall about 6 weeks ago and had soreness over the right chest after his fall. No other chest pain or pressure. Shortness of breath is unchanged. He is on 24/7 home O2. No recent problems with worsening of his chronic leg swelling. In fact he feels his leg swelling has been better than baseline. No chest pain or pressure. Reports compliance with his medications.   Past Medical History:  Diagnosis Date  . Adenomatous colon polyp   . Aortic insufficiency    Echo 3/18: Severe concentric LVH, EF 60-65, normal wall motion, moderate AI, mild LAE, mild TR // Echo 9/18: Mild concentric LVH, EF 60-65, normal wall motion, trivial AI, MAC, mild BAE  . Arthritis    left hip replacement  . Atrial flutter ([place])    onset  2011. s/p EPS/RFA 12/2011  . Cataract   . Chronic bronchitis   . Contact dermatitis and other eczema due to plants (except food)   . COPD (chronic obstructive pulmonary disease) ([place])   . GERD (gastroesophageal reflux disease)   . Hyperlipidemia   . Hypertension   . Lung cancer ([place])   . LV dysfunction    EF  45-50% 12/2011  . OSA (obstructive sleep apnea) 10/13/2016   Mild with AHI 9.7/hr with significant oxygen desaturations as low as 76% now on CPAP  . Other peripheral vascular disease(443.89)    bilateral lower extremity  . Tobacco use disorder    dependent  . Transient global amnesia   . Tuberculosis   . Type II diabetes mellitus ([place])     Past Surgical History:  Procedure Laterality Date  . A FLUTTER ABLATION N/A 12/28/2011   Procedure: ABLATION A FLUTTER;  Surgeon: Evans Lance, MD;  Location: [facility] CATH LAB;  Service: Cardiovascular;  Laterality: N/A;  . CARDIAC CATHETERIZATION N/A 07/10/2015   Procedure: Left Heart Cath and Coronary Angiography;  Surgeon: Sherren Mocha, MD;  Location: [facility] CV LAB;  Service: Cardiovascular;  Laterality: N/A;  . CARDIAC ELECTROPHYSIOLOGY MAPPING AND ABLATION  03/2010  . CARDIOVERSION N/A 07/13/2016   Procedure: CARDIOVERSION;  Surgeon: Skeet Latch, MD;  Location: [facility];  Service: Cardiovascular;  Laterality: N/A;  . COLONOSCOPY    . colonoscopy with polypectomy  2006 & 2011   Dr [name]  . CYSTOSCOPY  11/2007   Dr Jeffie Pollock  . PARTIAL HIP ARTHROPLASTY Right 01/2000   "Right; replaced ball & stem"  . PARTIAL HIP ARTHROPLASTY Left   . POLYPECTOMY    . TEE WITHOUT CARDIOVERSION  12/28/2011   Procedure: TRANSESOPHAGEAL ECHOCARDIOGRAM (TEE);  Surgeon: Larey Dresser, MD;  Location: [facility] ENDOSCOPY;  Service: Cardiovascular;  Laterality: N/A;    Current Outpatient Prescriptions  Medication Sig Dispense Refill  . acetaminophen (TYLENOL) 650 MG CR tablet Take 650 mg by mouth every 8 (eight) hours as needed for pain.    . clobetasol (OLUX) 0.05 % topical foam Apply 1 application topically 2 (two) times daily as needed (skin).   0  . collagenase (SANTYL) ointment Apply 1 application topically daily as needed (dermal ulcer).     [name] diltiazem (CARTIA XT) 180 MG 24 hr capsule Take 1 capsule (180 mg total) by mouth 2 (two) times daily. 180 capsule 3  .  etanercept (ENBREL SURECLICK) 50 MG/ML injection Inject 50 mg into the skin once a week. Inject on Sundays & Wednesdays     . Flaxseed, Linseed, (FLAX SEED OIL) 1000 MG CAPS Take 1 capsule by mouth 2 (two) times daily.     . Fluticasone-Salmeterol (ADVAIR) 100-50 MCG/DOSE AEPB Inhale 1 puff into the lungs 2 (two) times daily. Need office visit for refills 60 each 6  . furosemide (LASIX) 40 MG tablet take 2 tablets by mouth twice a day 360 tablet 3  . insulin detemir (LEVEMIR) 100 UNIT/ML injection Inject 28 Units into the skin at bedtime.     [name] losartan (COZAAR) 25 MG tablet Take 25 mg by mouth daily.    . metFORMIN (GLUCOPHAGE) 1000 MG tablet Take 1,000 mg by mouth 2 (two) times daily with a meal.    . metolazone (ZAROXOLYN) 2.5 MG tablet Take 2.5 mg by mouth as directed. Twice weekly  0  . metoprolol (LOPRESSOR) 100 MG tablet Take 100-150 mg by mouth See admin instructions. 1 tablet every morning And take 1 and 1/2 tablets by mouth every evening    . Multiple Vitamins-Minerals (CENTRUM ADULTS) TABS Take 1 tablet by mouth daily.    . mupirocin ointment (BACTROBAN) 2 % Apply 1 application topically 2 (two) times daily as needed (skin).     . NOVOFINE 32G X 6 MM MISC use twice a day 100 each 5  . Omega-3 Fatty Acids (FISH OIL) 1000 MG CAPS Take 1 capsule by mouth 2 (two) times daily.     Glory Rosebush DELICA LANCETS FINE MISC TEST BLOOD SUGAR DAILY 200 each 12  . potassium chloride SA (K-DUR,KLOR-CON) 20 MEQ tablet take 1 tablet by mouth twice a day 180 tablet 3  . rivaroxaban (XARELTO) 20 MG TABS tablet Take 20 mg by mouth every morning.     . rosuvastatin (CRESTOR) 10 MG tablet Take 1 tablet (10 mg total) by mouth daily. 90 tablet 3  . tamsulosin (FLOMAX) 0.4 MG CAPS capsule Take 0.4 mg by mouth daily.     [name] triamcinolone cream (KENALOG) 0.1 % Apply 1 application topically 2 (two) times daily as needed (affected area/skin).     [name] VICTOZA 18 MG/3ML SOPN INJECT 0.3ML (=1.8MG)      SUBCUTANEOUSLY DAILY  27 mL 1   No current facility-administered medications for this visit.     Allergies:   Niacin   Social History:  The patient  reports that he has been smoking Cigarettes.  He has a 44.00 pack-year smoking history. He has never used smokeless tobacco. He reports that he does not drink alcohol or use drugs.   Family History:  The patient's  family history includes Crohn's disease in his son; Diabetes in his brother, brother, mother, and paternal grandmother; Heart attack (age of onset: 66) in his brother; Heart attack (age of  onset: 32) in his brother; Hepatitis C in his brother; Hypertension in his mother; Lung cancer in his father; Stroke (age of onset: 52) in his mother; Throat cancer in his paternal uncle.    ROS:  Please see the history of present illness.  All other systems are reviewed and negative.    PHYSICAL EXAM: VS:  BP (!) 110/50   Pulse 84   Ht 5' 11"  (1.803 m)   Wt 119.2 kg (262 lb 12.8 oz)   BMI 36.65 kg/m  , BMI Body mass index is 36.65 kg/m. GEN: Well nourished, well developed, in no acute distress  HEENT: normal  Neck: no JVD, no masses. No carotid bruits Cardiac: irregularly irregular without murmur or gallop       Respiratory:  clear to auscultation bilaterally, normal work of breathing GI: soft, nontender, nondistended, + BS MS: no deformity or atrophy  Ext: trace bilateral pretibial edema with chronic stasis dermatitis bilaterally Skin: warm and dry, no rash Neuro:  Strength and sensation are intact Psych: euthymic mood, full affect  EKG:  EKG is not ordered today.  Recent Labs: 12/04/2016: NT-Pro BNP 628 05/25/2017: ALT 13; BUN 16; Creatinine, Ser 1.43; Hemoglobin 16.1; Platelets 164.0; Potassium 3.7; Sodium 142; TSH 1.78   Lipid Panel     Component Value Date/Time   CHOL 97 05/25/2017 1158   TRIG 98.0 05/25/2017 1158   HDL 31.10 (L) 05/25/2017 1158   CHOLHDL 3 05/25/2017 1158   VLDL 19.6 05/25/2017 1158   LDLCALC 46 05/25/2017 1158      Wt  Readings from Last 3 Encounters:  06/21/17 119.2 kg (262 lb 12.8 oz)  06/10/17 121.4 kg (267 lb 9.6 oz)  05/25/17 121.6 kg (268 lb)     Cardiac Studies Reviewed: Cardiac Cath 07-10-2015: Conclusion   1. Normal LV systolic function with normal LVEDP 2. Severe distal left circumflex stenosis - recommend medical therapy as small amount of myocardium supplied 3. Mild nonobstructive stenosis of a large, wrap-around LAD 4. Total occlusion of a small, codominant RCA  Recommend medical therapy, risk reduction measures. I don't think PCI will significantly impact symptoms or overall clinical outcome.   Echo 05/28/2017: Study Conclusions  - Left ventricle: The cavity size was normal. There was mild   concentric hypertrophy. Systolic function was normal. The   estimated ejection fraction was in the range of 60% to 65%. Wall   motion was normal; there were no regional wall motion   abnormalities. - Aortic valve: There was trivial regurgitation. - Mitral valve: Calcified annulus. - Left atrium: The atrium was mildly dilated. - Right atrium: The atrium was mildly dilated. - Pulmonary arteries: Systolic pressure could not be accurately   estimated.  ASSESSMENT AND PLAN: 1.  Chronic diastolic heart failure with NYHA 2 symptoms: encouraged patient to limits salt and fluids, continue same medications. Medications reviewed today.  2. Persistent atrial fibrillation: tolerating anticoagulation with Xarelto. Recommended that he stop cilostazol to reduce bleeding risk.   3. Hypertensive heart disease with heart failure: BP well controlled.   4. CAD, native vessel, with angina: no changes today.  5. Hyperlipidemia: continue crestor  Current medicines are reviewed with the patient today.  The patient does not have concerns regarding medicines.  Labs/ tests ordered today include:  No orders of the defined types were placed in this encounter.  Disposition:   FU 6 months  Signed, Sherren Mocha, MD  06/22/2017 5:54 PM    [facility] [address] [place], [place]  [address] Phone: [phone]; Fax: [phone]

## 2017-06-23 ENCOUNTER — Ambulatory Visit (INDEPENDENT_AMBULATORY_CARE_PROVIDER_SITE_OTHER): Payer: Medicare Other | Admitting: Pulmonary Disease

## 2017-06-23 ENCOUNTER — Encounter: Payer: Self-pay | Admitting: Pulmonary Disease

## 2017-06-23 DIAGNOSIS — I25709 Atherosclerosis of coronary artery bypass graft(s), unspecified, with unspecified angina pectoris: Secondary | ICD-10-CM

## 2017-06-23 DIAGNOSIS — J84112 Idiopathic pulmonary fibrosis: Secondary | ICD-10-CM | POA: Diagnosis not present

## 2017-06-23 DIAGNOSIS — J9611 Chronic respiratory failure with hypoxia: Secondary | ICD-10-CM

## 2017-06-23 DIAGNOSIS — J441 Chronic obstructive pulmonary disease with (acute) exacerbation: Secondary | ICD-10-CM

## 2017-06-23 MED ORDER — FLUTICASONE FUROATE-VILANTEROL 100-25 MCG/INH IN AEPB: 1.0000 | INHALATION_SPRAY | Freq: Every day | RESPIRATORY_TRACT | 4 refills | Status: DC

## 2017-06-23 NOTE — Assessment & Plan Note (Signed)
We discussed starting medication for pulmonary fibrosis called OFEV (pill burden for perfenidone will be too high] We will start the process for insurance approval  You will need liver function test checked 1 month after starting this medication

## 2017-06-23 NOTE — Assessment & Plan Note (Signed)
I offered him pulmonary rehabilitation but he is not interested at this time. Immunizations up-to-date

## 2017-06-23 NOTE — Addendum Note (Signed)
Addended by: Valerie Salts on: 06/23/2017 03:01 PM   Modules accepted: Orders

## 2017-06-23 NOTE — Assessment & Plan Note (Signed)
Trial of BREO 100  instead of Advair

## 2017-06-23 NOTE — Progress Notes (Signed)
   Subjective:    Patient ID: Neil Carroll., male    DOB: 15-Oct-1950, 66 y.o.   MRN: [id number]  HPI  66 year old heavy smoker with chronic diastolic heart failure for FUof nocturnal hypoxia, ILD and COPD. He has CAD, chronic dCHF & chronic atrial fibrillation, underwent DC CV in 06/2016. He is maintained on Enbrel for psoriasis  He continues to smoke 5-10 cigs/d  He underwent SBRT to RUL  squamous cell lung cancer-stage TII N0 M0 02/2017  Repeat CT 04/2017 shows slight decrease in right upper lobe mass to 2.6 cm He continues to be short of breath especially on walking and reports a chronic dry cough.  We discussed pulmonary fibrosis, natural history and treatment options today   Significant tests/ events reviewed  Echo 01/2016 shows normal LVEF with RVSP 39 05/2016 PSG showed mild OSA with AHI 10/hour with lowest desaturation of 76%-on a subsequent titration study, CPAP was titrated to 13 cm with good control of events and he needed 2 L of oxygen to correct for nocturnal hypoxia  Spirometry 09/2016 >>no evidence of airway obstruction with a ratio of 82 and FEV1 of 73% FVC of 68% suggestive of moderate restriction.  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 Adenomatous colon polyp   . Aortic insufficiency    Echo 3/18: Severe concentric LVH, EF 60-65, normal wall motion, moderate AI, mild LAE, mild TR // Echo 9/18: Mild concentric LVH, EF 60-65, normal wall motion, trivial AI, MAC, mild BAE  . Arthritis    left hip replacement  . Atrial flutter ([place])    onset  2011. s/p EPS/RFA 12/2011  . Cataract   . Chronic bronchitis   . Contact dermatitis and other eczema due to plants (except food)   . COPD (chronic obstructive pulmonary disease) ([place])   . GERD (gastroesophageal reflux disease)   . Hyperlipidemia    . Hypertension   . Lung cancer ([place])   . LV dysfunction    EF 45-50% 12/2011  . OSA (obstructive sleep apnea) 10/13/2016   Mild with AHI 9.7/hr with significant oxygen desaturations as low as 76% now on CPAP  . Other peripheral vascular disease(443.89)    bilateral lower extremity  . Tobacco use disorder    dependent  . Transient global amnesia   . Tuberculosis   . Type II diabetes mellitus ([facility])      Review of Systems neg for any significant sore throat, dysphagia, itching, sneezing, nasal congestion or excess/ purulent secretions, fever, chills, sweats, unintended wt loss, pleuritic or exertional cp, hempoptysis, orthopnea pnd or change in chronic leg swelling.   Also denies presyncope, palpitations, heartburn, abdominal pain, nausea, vomiting, diarrhea or change in bowel or urinary habits, dysuria,hematuria, rash, arthralgias, visual complaints, headache, numbness weakness or ataxia.     Objective:   Physical Exam   Gen. Pleasant, obese, in no distress ENT - no lesions, no post nasal drip Neck: No JVD, no thyromegaly, no carotid bruits Lungs: no use of accessory muscles, no dullness to percussion, bibasal rales 1/3, no rhonchi  Cardiovascular: Rhythm regular, heart sounds  normal, no murmurs or gallops, no peripheral edema Musculoskeletal: No deformities, no cyanosis or clubbing , no tremors        Assessment & Plan:

## 2017-06-23 NOTE — Patient Instructions (Signed)
We discussed starting medication for pulmonary fibrosis called OFEV We will start the process for insurance approval  You will need liver function test checked 1 month after starting this medication

## 2017-06-25 ENCOUNTER — Other Ambulatory Visit: Payer: Self-pay

## 2017-06-25 MED ORDER — NINTEDANIB ESYLATE 150 MG PO CAPS: 150.0000 mg | ORAL_CAPSULE | Freq: Two times a day (BID) | ORAL | 12 refills | Status: DC

## 2017-07-02 ENCOUNTER — Telehealth: Payer: Self-pay

## 2017-07-02 NOTE — Telephone Encounter (Signed)
PA for OFEV received. Clinical information sheet filled out with documents for PA on [place]/OFEV. Fax machine is down so I gave the paperwork to Hart to fax on Monday for RA pt. Will route message so she can document fax. FYI Cherina

## 2017-07-07 ENCOUNTER — Encounter (HOSPITAL_COMMUNITY): Payer: Self-pay

## 2017-07-07 ENCOUNTER — Other Ambulatory Visit (HOSPITAL_BASED_OUTPATIENT_CLINIC_OR_DEPARTMENT_OTHER): Payer: Medicare Other

## 2017-07-07 ENCOUNTER — Ambulatory Visit (HOSPITAL_COMMUNITY)
Admission: RE | Admit: 2017-07-07 | Discharge: 2017-07-07 | Disposition: A | Discharge status: Home / Self Care | Payer: Medicare Other | Source: Ambulatory Visit | Attending: Internal Medicine | Admitting: Internal Medicine

## 2017-07-07 DIAGNOSIS — C3491 Malignant neoplasm of unspecified part of right bronchus or lung: Secondary | ICD-10-CM | POA: Insufficient documentation

## 2017-07-07 DIAGNOSIS — C3490 Malignant neoplasm of unspecified part of unspecified bronchus or lung: Secondary | ICD-10-CM | POA: Diagnosis not present

## 2017-07-07 DIAGNOSIS — C3411 Malignant neoplasm of upper lobe, right bronchus or lung: Secondary | ICD-10-CM | POA: Diagnosis present

## 2017-07-07 DIAGNOSIS — J439 Emphysema, unspecified: Secondary | ICD-10-CM | POA: Insufficient documentation

## 2017-07-07 DIAGNOSIS — I288 Other diseases of pulmonary vessels: Secondary | ICD-10-CM | POA: Diagnosis not present

## 2017-07-07 LAB — CBC WITH DIFFERENTIAL/PLATELET
BASO%: 0.5 % (ref 0.0–2.0)
BASOS ABS: 0 10*3/uL (ref 0.0–0.1)
EOS%: 3.1 % (ref 0.0–7.0)
Eosinophils Absolute: 0.2 10*3/uL (ref 0.0–0.5)
HCT: 49.6 % (ref 38.4–49.9)
HGB: 16.2 g/dL (ref 13.0–17.1)
LYMPH%: 21.2 % (ref 14.0–49.0)
MCH: 30.6 pg (ref 27.2–33.4)
MCHC: 32.7 g/dL (ref 32.0–36.0)
MCV: 93.6 fL (ref 79.3–98.0)
MONO#: 0.9 10*3/uL (ref 0.1–0.9)
MONO%: 11.2 % (ref 0.0–14.0)
NEUT#: 5.2 10*3/uL (ref 1.5–6.5)
NEUT%: 64 % (ref 39.0–75.0)
Platelets: 145 10*3/uL (ref 140–400)
RBC: 5.3 10*6/uL (ref 4.20–5.82)
RDW: 14.7 % — ABNORMAL HIGH (ref 11.0–14.6)
WBC: 8.1 10*3/uL (ref 4.0–10.3)
lymph#: 1.7 10*3/uL (ref 0.9–3.3)

## 2017-07-07 LAB — COMPREHENSIVE METABOLIC PANEL
ALT: 15 U/L (ref 0–55)
AST: 15 U/L (ref 5–34)
Albumin: 3.6 g/dL (ref 3.5–5.0)
Alkaline Phosphatase: 55 U/L (ref 40–150)
Anion Gap: 10 mEq/L (ref 3–11)
BUN: 18.8 mg/dL (ref 7.0–26.0)
CALCIUM: 9.5 mg/dL (ref 8.4–10.4)
CHLORIDE: 105 meq/L (ref 98–109)
CO2: 26 mEq/L (ref 22–29)
Creatinine: 1.5 mg/dL — ABNORMAL HIGH (ref 0.7–1.3)
EGFR: 55 mL/min/{1.73_m2} — ABNORMAL LOW (ref >60)
GLUCOSE: 103 mg/dL (ref 70–140)
POTASSIUM: 4.1 meq/L (ref 3.5–5.1)
SODIUM: 140 meq/L (ref 136–145)
Total Bilirubin: 0.51 mg/dL (ref 0.20–1.20)
Total Protein: 7.7 g/dL (ref 6.4–8.3)

## 2017-07-07 MED ORDER — IOPAMIDOL (ISOVUE-300) INJECTION 61%
INTRAVENOUS | Status: AC
  Filled 2017-07-07: qty 75

## 2017-07-07 MED ORDER — IOPAMIDOL (ISOVUE-300) INJECTION 61%
75.0000 mL | Freq: Once | INTRAVENOUS | Status: AC | PRN
  Administered 2017-07-07: 75 mL via INTRAVENOUS

## 2017-07-07 NOTE — Telephone Encounter (Signed)
Forms have been faxed. Will keep in my look-at folder for follow up.

## 2017-07-08 ENCOUNTER — Encounter: Payer: Self-pay | Admitting: Internal Medicine

## 2017-07-08 ENCOUNTER — Ambulatory Visit (HOSPITAL_BASED_OUTPATIENT_CLINIC_OR_DEPARTMENT_OTHER): Payer: Medicare Other | Admitting: Internal Medicine

## 2017-07-08 ENCOUNTER — Telehealth: Payer: Self-pay | Admitting: Internal Medicine

## 2017-07-08 VITALS — BP 107/73 | HR 89 | Temp 98.5°F | Resp 16 | Ht 71.0 in | Wt 262.9 lb

## 2017-07-08 DIAGNOSIS — R0602 Shortness of breath: Secondary | ICD-10-CM

## 2017-07-08 DIAGNOSIS — R05 Cough: Secondary | ICD-10-CM

## 2017-07-08 DIAGNOSIS — C3490 Malignant neoplasm of unspecified part of unspecified bronchus or lung: Secondary | ICD-10-CM

## 2017-07-08 DIAGNOSIS — Z720 Tobacco use: Secondary | ICD-10-CM

## 2017-07-08 DIAGNOSIS — C3411 Malignant neoplasm of upper lobe, right bronchus or lung: Secondary | ICD-10-CM

## 2017-07-08 DIAGNOSIS — C3491 Malignant neoplasm of unspecified part of right bronchus or lung: Secondary | ICD-10-CM

## 2017-07-08 DIAGNOSIS — J449 Chronic obstructive pulmonary disease, unspecified: Secondary | ICD-10-CM

## 2017-07-08 DIAGNOSIS — J9610 Chronic respiratory failure, unspecified whether with hypoxia or hypercapnia: Secondary | ICD-10-CM

## 2017-07-08 NOTE — Telephone Encounter (Signed)
Gave avs and calendar for April 2019 °

## 2017-07-08 NOTE — Patient Instructions (Signed)
Steps to Quit Smoking Smoking tobacco can be bad for your health. It can also affect almost every organ in your body. Smoking puts you and people around you at risk for many serious long-lasting (chronic) diseases. Quitting smoking is hard, but it is one of the best things that you can do for your health. It is never too late to quit. What are the benefits of quitting smoking? When you quit smoking, you lower your risk for getting serious diseases and conditions. They can include:  Lung cancer or lung disease.  Heart disease.  Stroke.  Heart attack.  Not being able to have children (infertility).  Weak bones (osteoporosis) and broken bones (fractures).  If you have coughing, wheezing, and shortness of breath, those symptoms may get better when you quit. You may also get sick less often. If you are pregnant, quitting smoking can help to lower your chances of having a baby of low birth weight. What can I do to help me quit smoking? Talk with your doctor about what can help you quit smoking. Some things you can do (strategies) include:  Quitting smoking totally, instead of slowly cutting back how much you smoke over a period of time.  Going to in-person counseling. You are more likely to quit if you go to many counseling sessions.  Using resources and support systems, such as: ? Online chats with a counselor. ? Phone quitlines. ? Printed self-help materials. ? Support groups or group counseling. ? Text messaging programs. ? Mobile phone apps or applications.  Taking medicines. Some of these medicines may have nicotine in them. If you are pregnant or breastfeeding, do not take any medicines to quit smoking unless your doctor says it is okay. Talk with your doctor about counseling or other things that can help you.  Talk with your doctor about using more than one strategy at the same time, such as taking medicines while you are also going to in-person counseling. This can help make  quitting easier. What things can I do to make it easier to quit? Quitting smoking might feel very hard at first, but there is a lot that you can do to make it easier. Take these steps:  Talk to your family and friends. Ask them to support and encourage you.  Call phone quitlines, reach out to support groups, or work with a counselor.  Ask people who smoke to not smoke around you.  Avoid places that make you want (trigger) to smoke, such as: ? Bars. ? Parties. ? Smoke-break areas at work.  Spend time with people who do not smoke.  Lower the stress in your life. Stress can make you want to smoke. Try these things to help your stress: ? Getting regular exercise. ? Deep-breathing exercises. ? Yoga. ? Meditating. ? Doing a body scan. To do this, close your eyes, focus on one area of your body at a time from head to toe, and notice which parts of your body are tense. Try to relax the muscles in those areas.  Download or buy apps on your mobile phone or tablet that can help you stick to your quit plan. There are many free apps, such as QuitGuide from the CDC (Centers for Disease Control and Prevention). You can find more support from smokefree.gov and other websites.  This information is not intended to replace advice given to you by your health care provider. Make sure you discuss any questions you have with your health care provider. Document Released: 07/04/2009 Document   Revised: 05/05/2016 Document Reviewed: 01/22/2015 Elsevier Interactive Patient Education  2018 Elsevier Inc.  

## 2017-07-08 NOTE — Progress Notes (Signed)
[facility] [phone]   [phone]  OFFICE PROGRESS NOTE  Hoyt Koch, MD [place] [address]  DIAGNOSIS: Stage IB (T2a, N0, M0) non-small cell lung cancer, squamous cell carcinoma presented with right upper lobe pleural-based mass diagnosed in June 2018.  PRIOR THERAPY: definitive and curative SBRT to the right upper lobe lung nodule under the care of Dr. Tammi Klippel completed on 04/05/2017.  CURRENT THERAPY: Observation.  INTERVAL HISTORY: REMIJIO Carroll Sr. 66 y.o. male returns to the clinic today for follow-up visit. The patient continues to complain of shortness of breath and cough productive of whitish sputum. E is currently on home oxygen. He denied having any recent chest pain or hemoptysis. He denied having any weight loss or night sweats. He tolerated the previous curative radiotherapy fairly well. He denied having any nausea, vomiting, diarrhea or constipation. He had repeat CT scan of the chest performed recently and he is here for evaluation and discussion of his scan results.  MEDICAL HISTORY: Past Medical History:  Diagnosis Date  . Adenomatous colon polyp   . Aortic insufficiency    Echo 3/18: Severe concentric LVH, EF 60-65, normal wall motion, moderate AI, mild LAE, mild TR // Echo 9/18: Mild concentric LVH, EF 60-65, normal wall motion, trivial AI, MAC, mild BAE  . Arthritis    left hip replacement  . Atrial flutter ([place])    onset  2011. s/p EPS/RFA 12/2011  . Cataract   . Chronic bronchitis   . Contact dermatitis and other eczema due to plants (except food)   . COPD (chronic obstructive pulmonary disease) ([place])   . GERD (gastroesophageal reflux disease)   . Hyperlipidemia   . Hypertension   . Lung cancer ([place])   . LV dysfunction    EF 45-50% 12/2011  . OSA (obstructive sleep apnea) 10/13/2016   Mild with AHI 9.7/hr with significant oxygen desaturations as low as 76% now on CPAP  . Other  peripheral vascular disease(443.89)    bilateral lower extremity  . Tobacco use disorder    dependent  . Transient global amnesia   . Tuberculosis   . Type II diabetes mellitus ([facility])     ALLERGIES:  is allergic to niacin.  MEDICATIONS:  Current Outpatient Prescriptions  Medication Sig Dispense Refill  . acetaminophen (TYLENOL) 650 MG CR tablet Take 650 mg by mouth every 8 (eight) hours as needed for pain.    . clobetasol (OLUX) 0.05 % topical foam Apply 1 application topically 2 (two) times daily as needed (skin).   0  . collagenase (SANTYL) ointment Apply 1 application topically daily as needed (dermal ulcer).     [name] diltiazem (CARTIA XT) 180 MG 24 hr capsule Take 1 capsule (180 mg total) by mouth 2 (two) times daily. 180 capsule 3  . etanercept (ENBREL SURECLICK) 50 MG/ML injection Inject 50 mg into the skin once a week. Inject on Sundays & Wednesdays     . Flaxseed, Linseed, (FLAX SEED OIL) 1000 MG CAPS Take 1 capsule by mouth 2 (two) times daily.     . fluticasone furoate-vilanterol (BREO ELLIPTA) 100-25 MCG/INH AEPB Inhale 1 puff into the lungs daily. 1 each 4  . furosemide (LASIX) 40 MG tablet take 2 tablets by mouth twice a day 360 tablet 3  . insulin detemir (LEVEMIR) 100 UNIT/ML injection Inject 28 Units into the skin at bedtime.     [name] losartan (COZAAR) 25 MG tablet Take 25  mg by mouth daily.    . metFORMIN (GLUCOPHAGE) 1000 MG tablet Take 1,000 mg by mouth 2 (two) times daily with a meal.    . metolazone (ZAROXOLYN) 2.5 MG tablet Take 2.5 mg by mouth as directed. Twice weekly  0  . metoprolol (LOPRESSOR) 100 MG tablet Take 100-150 mg by mouth See admin instructions. 1 tablet every morning And take 1 and 1/2 tablets by mouth every evening    . Multiple Vitamins-Minerals (CENTRUM ADULTS) TABS Take 1 tablet by mouth daily.    . mupirocin ointment (BACTROBAN) 2 % Apply 1 application topically 2 (two) times daily as needed (skin).     . Nintedanib (OFEV) 150 MG CAPS Take 150 mg by  mouth 2 (two) times daily. 60 capsule 12  . NOVOFINE 32G X 6 MM MISC use twice a day 100 each 5  . Omega-3 Fatty Acids (FISH OIL) 1000 MG CAPS Take 1 capsule by mouth 2 (two) times daily.     Glory Rosebush DELICA LANCETS FINE MISC TEST BLOOD SUGAR DAILY 200 each 12  . potassium chloride SA (K-DUR,KLOR-CON) 20 MEQ tablet take 1 tablet by mouth twice a day 180 tablet 3  . rivaroxaban (XARELTO) 20 MG TABS tablet Take 20 mg by mouth every morning.     . rosuvastatin (CRESTOR) 10 MG tablet Take 1 tablet (10 mg total) by mouth daily. 90 tablet 3  . tamsulosin (FLOMAX) 0.4 MG CAPS capsule Take 0.4 mg by mouth daily.     [name] triamcinolone cream (KENALOG) 0.1 % Apply 1 application topically 2 (two) times daily as needed (affected area/skin).     [name] VICTOZA 18 MG/3ML SOPN INJECT 0.3ML (=1.8MG)      SUBCUTANEOUSLY DAILY 27 mL 1   No current facility-administered medications for this visit.     SURGICAL HISTORY:  Past Surgical History:  Procedure Laterality Date  . A FLUTTER ABLATION N/A 12/28/2011   Procedure: ABLATION A FLUTTER;  Surgeon: Evans Lance, MD;  Location: [facility] CATH LAB;  Service: Cardiovascular;  Laterality: N/A;  . CARDIAC CATHETERIZATION N/A 07/10/2015   Procedure: Left Heart Cath and Coronary Angiography;  Surgeon: Sherren Mocha, MD;  Location: [facility] CV LAB;  Service: Cardiovascular;  Laterality: N/A;  . CARDIAC ELECTROPHYSIOLOGY MAPPING AND ABLATION  03/2010  . CARDIOVERSION N/A 07/13/2016   Procedure: CARDIOVERSION;  Surgeon: Skeet Latch, MD;  Location: [facility];  Service: Cardiovascular;  Laterality: N/A;  . COLONOSCOPY    . colonoscopy with polypectomy  2006 & 2011   Dr [name]  . CYSTOSCOPY  11/2007   Dr Jeffie Pollock  . PARTIAL HIP ARTHROPLASTY Right 01/2000   "Right; replaced ball & stem"  . PARTIAL HIP ARTHROPLASTY Left   . POLYPECTOMY    . TEE WITHOUT CARDIOVERSION  12/28/2011   Procedure: TRANSESOPHAGEAL ECHOCARDIOGRAM (TEE);  Surgeon: Larey Dresser, MD;  Location: [facility];  Service: Cardiovascular;  Laterality: N/A;    REVIEW OF SYSTEMS:  A comprehensive review of systems was negative except for: Constitutional: positive for fatigue Respiratory: positive for cough, dyspnea on exertion and wheezing   PHYSICAL EXAMINATION: General appearance: alert, cooperative, fatigued and no distress Head: Normocephalic, without obvious abnormality, atraumatic Neck: no adenopathy, no JVD, supple, symmetrical, trachea midline and thyroid not enlarged, symmetric, no tenderness/mass/nodules Lymph nodes: Cervical, supraclavicular, and axillary nodes normal. Resp: wheezes bilaterally Back: symmetric, no curvature. ROM normal. No CVA tenderness. Cardio: regular rate and rhythm, S1, S2 normal, no murmur, click, rub or gallop GI: soft, non-tender; bowel  sounds normal; no masses,  no organomegaly Extremities: extremities normal, atraumatic, no cyanosis or edema  ECOG PERFORMANCE STATUS: 1 - Symptomatic but completely ambulatory  Blood pressure 107/73, pulse 89, temperature 98.5 F (36.9 C), temperature source Oral, resp. rate 16, height _0  (1.803 m), weight 262 lb 14.4 oz (119.3 kg), SpO2 92 %.  LABORATORY DATA: Lab Results  Component Value Date   WBC 8.1 07/07/2017   HGB 16.2 07/07/2017   HCT 49.6 07/07/2017   MCV 93.6 07/07/2017   PLT 145 07/07/2017      Chemistry      Component Value Date/Time   NA 140 07/07/2017 1015   K 4.1 07/07/2017 1015   CL 103 05/25/2017 1158   CO2 26 07/07/2017 1015   BUN 18.8 07/07/2017 1015   CREATININE 1.5 (H) 07/07/2017 1015   GLU 149 07/07/2013 1325      Component Value Date/Time   CALCIUM 9.5 07/07/2017 1015   ALKPHOS 55 07/07/2017 1015   AST 15 07/07/2017 1015   ALT 15 07/07/2017 1015   BILITOT 0.51 07/07/2017 1015       RADIOGRAPHIC STUDIES: Ct Chest W Contrast  Result Date: 07/07/2017 CLINICAL DATA:  Right-sided lung cancer EXAM: CT CHEST WITH CONTRAST TECHNIQUE: Multidetector CT imaging of the chest  was performed during intravenous contrast administration. CONTRAST:  73m ISOVUE-300 IOPAMIDOL (ISOVUE-300) INJECTION 61% COMPARISON:  05/18/2017. FINDINGS: Cardiovascular: Heart size upper normal. No pericardial effusion. Coronary artery calcification is evident. Atherosclerotic calcification is noted in the wall of the thoracic aorta. Pulmonary arterial enlargement is again noted. Mediastinum/Nodes: 14 mm precarinal lymph node measured on the previous study is not substantially changed a 12 mm today. The other scattered small mediastinal lymph nodes are evident. Similar appearance of 10 mm short axis right hilar lymph node. Lungs/Pleura: Centrilobular and paraseptal emphysema most evident in the upper lungs bilaterally. As noted on the prior study, fibrotic lung disease has a basilar predominance and is bilateral, right slightly more than left. Peripheral right upper lobe irregular lesion is similar to prior at 1.2 x 2.6 cm today compared to 1.0 x 2.6 cm previously. There is slightly more prominent pleural thickening in this region today, potentially treatment related. Upper Abdomen: Unremarkable. Musculoskeletal: Bone windows reveal no worrisome lytic or sclerotic osseous lesions. IMPRESSION: 1. Similar appearance 2.6 cm right upper lobe peripheral spiculated nodule. Interval slight increase in underlying pleural thickening, potentially treatment related. 2. Persistent enlargement of the main pulmonary artery suggesting pulmonary arterial hypertension. 3. Upper lobe predominant emphysema with lower lung fibrotic disease showing pattern most suggestive of UIP (usual interstitial pneumonia). Electronically Signed   By: EMisty StanleyM.D.   On: 07/07/2017 14:08    ASSESSMENT AND PLAN: this is a very pleasant 66years old white male with history of stage IB (T2a, N0, M0) non-small cell lung cancer, squamous cell carcinoma status post SBRT to the right upper lobe lung mass by Dr. MTammi Klippel The patient is currently  on observation and feeling fine except for the baseline shortness breath secondary to COPD. Unfortunately he continues to smoke too. He had repeat CT scan of the chest performed recently that showed no evidence for disease progression. I discussed the scan results with the patient today and recommended for him to continue on observation with repeat CT scan of the chest in 6 months for restaging of his disease. For smoke cessation, I strongly encouraged the patient to quit smoking and offered him a smoke cessation program. For COPD, he is followed  by pulmonary medicine and his primary care physician. The patient was advised to call immediately if he has any concerning symptoms in the interval. The patient voices understanding of current disease status and treatment options and is in agreement with the current care plan.  All questions were answered. The patient knows to call the clinic with any problems, questions or concerns. We can certainly see the patient much sooner if necessary.  I spent 10 minutes counseling the patient face to face. The total time spent in the appointment was 15 minutes.  Disclaimer: This note was dictated with voice recognition software. Similar sounding words can inadvertently be transcribed and may not be corrected upon review.

## 2017-07-12 ENCOUNTER — Telehealth: Payer: Self-pay | Admitting: Pulmonary Disease

## 2017-07-12 DIAGNOSIS — L400 Psoriasis vulgaris: Secondary | ICD-10-CM | POA: Diagnosis not present

## 2017-07-12 NOTE — Telephone Encounter (Signed)
I have the denial letter. Will look at it when I have a chance today to see what is needed for the appeal.

## 2017-07-12 NOTE — Telephone Encounter (Signed)
Received a denial letter from [facility] stating that the Ofev had been denied. They stated that the use of medication does not meet the service benefit plan criteria for medical necessity due to the patient not having a FVC less than 82% of predicted.    RA, would you like to appeal this decision? We sent [employer] a copy of previous CT scans, OVs and PFTs. Please advise. Thanks!

## 2017-07-13 NOTE — Telephone Encounter (Signed)
Please let patient know. 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  He was recently diagnosed with stage I squamous cell carcinoma and has undergone radiation therapy. We advised to start him on anti-fibrotic medication OFEV.  This was denied because his FVC is 87%  Although his FVC is maintained, his DLCO was decreased at 33% more importantly he is on oxygen suggesting severe lung disease.  HRCT has confirmed probable IPF.  Hence I feel that anti-fibrotic medication is indicated

## 2017-07-14 DIAGNOSIS — B351 Tinea unguium: Secondary | ICD-10-CM | POA: Diagnosis not present

## 2017-07-14 DIAGNOSIS — E1351 Other specified diabetes mellitus with diabetic peripheral angiopathy without gangrene: Secondary | ICD-10-CM | POA: Diagnosis not present

## 2017-07-14 DIAGNOSIS — M79671 Pain in right foot: Secondary | ICD-10-CM | POA: Diagnosis not present

## 2017-07-14 DIAGNOSIS — M79672 Pain in left foot: Secondary | ICD-10-CM | POA: Diagnosis not present

## 2017-07-15 NOTE — Telephone Encounter (Signed)
Letter has been created and printed. Will stamp RA's signature on it and fax when I return to [place] this afternoon.

## 2017-07-16 NOTE — Telephone Encounter (Signed)
Letter was stamped and faxed and given to [place] to scan along with fax confirmation

## 2017-07-23 ENCOUNTER — Other Ambulatory Visit: Payer: Self-pay

## 2017-07-23 ENCOUNTER — Telehealth: Payer: Self-pay | Admitting: Endocrinology

## 2017-07-23 NOTE — Telephone Encounter (Signed)
Patient is requesting to have the following prescriptions called in; 1. Medformin 2. Levemir Flex Touch 3. Victoza To NEW Pharmacy: [facility] at [place].  He is no longer using [facility].

## 2017-07-23 NOTE — Telephone Encounter (Signed)
Please advise correct dosages for:  Metformin Levemir  Not clear on current correct dose for each.  Please advise.

## 2017-07-26 ENCOUNTER — Other Ambulatory Visit: Payer: Self-pay

## 2017-07-26 MED ORDER — LIRAGLUTIDE 18 MG/3ML ~~LOC~~ SOPN: PEN_INJECTOR | SUBCUTANEOUS | 1 refills | Status: DC

## 2017-07-26 MED ORDER — METFORMIN HCL 1000 MG PO TABS: 1000.0000 mg | ORAL_TABLET | Freq: Two times a day (BID) | ORAL | 3 refills | Status: DC

## 2017-07-26 MED ORDER — INSULIN DETEMIR 100 UNIT/ML FLEXPEN: 28.0000 [IU] | PEN_INJECTOR | Freq: Every day | SUBCUTANEOUS | 1 refills | Status: DC

## 2017-07-26 NOTE — Telephone Encounter (Signed)
Dosages are the same as in my notes and previous prescription

## 2017-07-26 NOTE — Telephone Encounter (Signed)
Called patient and left a voice message on his cell phone to let him know that I have sent the 3 prescriptions to the [facility] for him.

## 2017-08-03 ENCOUNTER — Other Ambulatory Visit: Payer: Self-pay | Admitting: Cardiovascular Disease

## 2017-08-03 MED ORDER — METOPROLOL TARTRATE 100 MG PO TABS: ORAL_TABLET | ORAL | 3 refills | Status: AC

## 2017-08-03 NOTE — Telephone Encounter (Signed)
Pt's medication was sent to pt's pharmacy as requested. Confirmation received.  °

## 2017-08-09 ENCOUNTER — Telehealth: Payer: Self-pay | Admitting: Pulmonary Disease

## 2017-08-09 NOTE — Telephone Encounter (Signed)
Spoke with [employer], they stated the pt had to initiate  the appeal. She also saw where we faxed in records for appeal but stated the patient needed to write a letter to them requesting an appeal. I spoke with pt and advised him to do this and mail the letter to his insurance company or fax it to [phone]. Pt understood and will send letter. Nothing further is needed at this time.

## 2017-09-06 ENCOUNTER — Other Ambulatory Visit: Payer: Medicare Other

## 2017-09-08 ENCOUNTER — Other Ambulatory Visit (INDEPENDENT_AMBULATORY_CARE_PROVIDER_SITE_OTHER): Payer: Medicare Other

## 2017-09-08 DIAGNOSIS — E1165 Type 2 diabetes mellitus with hyperglycemia: Secondary | ICD-10-CM | POA: Diagnosis not present

## 2017-09-08 DIAGNOSIS — Z794 Long term (current) use of insulin: Secondary | ICD-10-CM | POA: Diagnosis not present

## 2017-09-08 LAB — COMPREHENSIVE METABOLIC PANEL
ALK PHOS: 51 U/L (ref 39–117)
ALT: 13 U/L (ref 0–53)
AST: 13 U/L (ref 0–37)
Albumin: 3.9 g/dL (ref 3.5–5.2)
BUN: 17 mg/dL (ref 6–23)
CHLORIDE: 101 meq/L (ref 96–112)
CO2: 28 meq/L (ref 19–32)
Calcium: 9 mg/dL (ref 8.4–10.5)
Creatinine, Ser: 1.38 mg/dL (ref 0.40–1.50)
GFR: 66.02 mL/min (ref >60.00)
GLUCOSE: 110 mg/dL — AB (ref 70–99)
POTASSIUM: 4.2 meq/L (ref 3.5–5.1)
SODIUM: 137 meq/L (ref 135–145)
TOTAL PROTEIN: 7.7 g/dL (ref 6.0–8.3)
Total Bilirubin: 0.4 mg/dL (ref 0.2–1.2)

## 2017-09-08 LAB — HEMOGLOBIN A1C: HEMOGLOBIN A1C: 7.4 % — AB (ref 4.6–6.5)

## 2017-09-09 ENCOUNTER — Ambulatory Visit (INDEPENDENT_AMBULATORY_CARE_PROVIDER_SITE_OTHER): Payer: Medicare Other | Admitting: Endocrinology

## 2017-09-09 ENCOUNTER — Encounter: Payer: Self-pay | Admitting: Endocrinology

## 2017-09-09 VITALS — BP 110/68 | HR 71 | Ht 71.0 in | Wt 262.6 lb

## 2017-09-09 DIAGNOSIS — Z794 Long term (current) use of insulin: Secondary | ICD-10-CM | POA: Diagnosis not present

## 2017-09-09 DIAGNOSIS — E1165 Type 2 diabetes mellitus with hyperglycemia: Secondary | ICD-10-CM

## 2017-09-09 DIAGNOSIS — I25709 Atherosclerosis of coronary artery bypass graft(s), unspecified, with unspecified angina pectoris: Secondary | ICD-10-CM | POA: Diagnosis not present

## 2017-09-09 NOTE — Progress Notes (Signed)
Patient ID: Neil Carroll., male   DOB: 1951/03/11, 66 y.o.   MRN: [id number]   Reason for Appointment : Followup for Type 2 Diabetes  History of Present Illness          Diagnosis: Type 2 diabetes mellitus, date of diagnosis:  2008     Past history: He has been treated with metformin only for several years and had relatively good control until about a year ago. However had been taking only 1000 mg of metformin. Because of his progressively higher blood sugars he had been started on Levemir insulin since 6/14.  However with this his A1c was still 10.2 in 9/14 and he was referred here With increasing Victoza to 1.8 mg in 3/15 his weight had come down and his blood sugars had improved   Recent history:   INSULIN regimen is described as:  Levemir 28 units hs   Non-insulin hypoglycemic drugs the patient is taking are: Metformin 2g, Victoza 1.8 mg daily  His A1c is slightly higher, risks range 7.2-7.6 and now 7.4  Current management, blood sugar patterns and problems identified:  He has checked blood sugars sporadically and mostly in the afternoon  He only has 1 or 2 blood sugars in the mornings lately and once this was high at 170  This may be related to his occasionally forgetting to take his Levemir insulin in the evening  Highest blood sugar at night was 172  Although he is trying to do fairly well with his diet and controlling portions his weight has not come down recently  Lab glucose was 110 fasting  Still not able to to exercise because of fatigue and shortness of breath   Side effects from medications have been: None Compliance with the medical regimen: Fair   Glucose monitoring: Irregular        Glucometer: One Touch.      Blood Glucose readings from download:  Mean values apply above for all meters except median for One Touch  PRE-MEAL Fasting Lunch Dinner Bedtime Overall  Glucose range: 118-170   117-150  172    Mean/median:   127   129    Hypoglycemia frequency: Never.           Self-care:   Meals: 2/day. 11 am 6 pm.  He is eating out at times, trying to get more salads  Oatmeal in am for breakfast  Physical activity: exercise:  Doing minimal      Dietician visit: Most recent: 09/2013                 Wt Readings from Last 3 Encounters:  09/09/17 262 lb 9.6 oz (119.1 kg)  07/08/17 262 lb 14.4 oz (119.3 kg)  06/23/17 262 lb (118.8 kg)       Lab Results  Component Value Date   HGBA1C 7.4 (H) 09/08/2017   HGBA1C 7.2 (H) 05/25/2017   HGBA1C 7.6 (H) 03/09/2017   Lab Results  Component Value Date   MICROALBUR 3.4 (H) 06/10/2017   LDLCALC 46 05/25/2017   CREATININE 1.38 09/08/2017   Appointment on 09/08/2017  Component Date Value Ref Range Status  . Sodium 09/08/2017 137  135 - 145 mEq/L Final  . Potassium 09/08/2017 4.2  3.5 - 5.1 mEq/L Final  . Chloride 09/08/2017 101  96 - 112 mEq/L Final  . CO2 09/08/2017 28  19 - 32 mEq/L Final  . Glucose, Bld 09/08/2017 110* 70 -  99 mg/dL Final  . BUN 09/08/2017 17  6 - 23 mg/dL Final  . Creatinine, Ser 09/08/2017 1.38  0.40 - 1.50 mg/dL Final  . Total Bilirubin 09/08/2017 0.4  0.2 - 1.2 mg/dL Final  . Alkaline Phosphatase 09/08/2017 51  39 - 117 U/L Final  . AST 09/08/2017 13  0 - 37 U/L Final  . ALT 09/08/2017 13  0 - 53 U/L Final  . Total Protein 09/08/2017 7.7  6.0 - 8.3 g/dL Final  . Albumin 09/08/2017 3.9  3.5 - 5.2 g/dL Final  . Calcium 09/08/2017 9.0  8.4 - 10.5 mg/dL Final  . GFR 09/08/2017 66.02  >60.00 mL/min Final  . Hgb A1c MFr Bld 09/08/2017 7.4* 4.6 - 6.5 % Final   Glycemic Control Guidelines for People with Diabetes:Non Diabetic:  <6%Goal of Therapy: <7%Additional Action Suggested:  >8%      Allergies as of 09/09/2017      Reactions   Niacin Nausea Only   REACTION: upset stomach      Medication List        Accurate as of 09/09/17 11:59 PM. Always use your most recent med list.          acetaminophen 650 MG CR tablet Commonly known as:   TYLENOL Take 650 mg by mouth every 8 (eight) hours as needed for pain.   CENTRUM ADULTS Tabs Take 1 tablet by mouth daily.   clobetasol 0.05 % topical foam Commonly known as:  OLUX Apply 1 application topically 2 (two) times daily as needed (skin).   collagenase ointment Commonly known as:  SANTYL Apply 1 application topically daily as needed (dermal ulcer).   diltiazem 180 MG 24 hr capsule Commonly known as:  CARTIA XT Take 1 capsule (180 mg total) by mouth 2 (two) times daily.   ENBREL SURECLICK 50 MG/ML injection Generic drug:  etanercept Inject 50 mg into the skin once a week. Inject on Sundays & Wednesdays   Fish Oil 1000 MG Caps Take 1 capsule by mouth 2 (two) times daily.   Flax Seed Oil 1000 MG Caps Take 1 capsule by mouth 2 (two) times daily.   fluticasone furoate-vilanterol 100-25 MCG/INH Aepb Commonly known as:  BREO ELLIPTA Inhale 1 puff into the lungs daily.   furosemide 40 MG tablet Commonly known as:  LASIX take 2 tablets by mouth twice a day   LEVEMIR 100 UNIT/ML injection Generic drug:  insulin detemir Inject 28 Units into the skin at bedtime.   Insulin Detemir 100 UNIT/ML Pen Commonly known as:  LEVEMIR FLEXPEN Inject 28 Units daily at 10 pm into the skin.   liraglutide 18 MG/3ML Sopn Commonly known as:  VICTOZA INJECT 0.3ML (=1.8MG)      SUBCUTANEOUSLY DAILY   losartan 25 MG tablet Commonly known as:  COZAAR Take 25 mg by mouth daily.   metFORMIN 1000 MG tablet Commonly known as:  GLUCOPHAGE Take 1 tablet (1,000 mg total) 2 (two) times daily with a meal by mouth.   metolazone 2.5 MG tablet Commonly known as:  ZAROXOLYN Take 2.5 mg by mouth as directed. Twice weekly   metoprolol tartrate 100 MG tablet Commonly known as:  LOPRESSOR take1 tablet by mouth every morning and take 1 and 1/2 tablets by mouth every evening   mupirocin ointment 2 % Commonly known as:  BACTROBAN Apply 1 application topically 2 (two) times daily as needed  (skin).   Nintedanib 150 MG Caps Commonly known as:  OFEV Take 150 mg by mouth  2 (two) times daily.   NOVOFINE 32G X 6 MM Misc Generic drug:  Insulin Pen Needle use twice a day   ONETOUCH DELICA LANCETS FINE Misc TEST BLOOD SUGAR DAILY   potassium chloride SA 20 MEQ tablet Commonly known as:  K-DUR,KLOR-CON take 1 tablet by mouth twice a day   rivaroxaban 20 MG Tabs tablet Commonly known as:  XARELTO Take 20 mg by mouth every morning.   rosuvastatin 10 MG tablet Commonly known as:  CRESTOR Take 1 tablet (10 mg total) by mouth daily.   tamsulosin 0.4 MG Caps capsule Commonly known as:  FLOMAX Take 0.4 mg by mouth daily.   triamcinolone cream 0.1 % Commonly known as:  KENALOG Apply 1 application topically 2 (two) times daily as needed (affected area/skin).       Allergies:  Allergies  Allergen Reactions  . Niacin Nausea Only    REACTION: upset stomach    Past Medical History:  Diagnosis Date  . Adenomatous colon polyp   . Aortic insufficiency    Echo 3/18: Severe concentric LVH, EF 60-65, normal wall motion, moderate AI, mild LAE, mild TR // Echo 9/18: Mild concentric LVH, EF 60-65, normal wall motion, trivial AI, MAC, mild BAE  . Arthritis    left hip replacement  . Atrial flutter ([place])    onset  2011. s/p EPS/RFA 12/2011  . Cataract   . Chronic bronchitis   . Contact dermatitis and other eczema due to plants (except food)   . COPD (chronic obstructive pulmonary disease) ([place])   . GERD (gastroesophageal reflux disease)   . Hyperlipidemia   . Hypertension   . Lung cancer ([place])   . LV dysfunction    EF 45-50% 12/2011  . OSA (obstructive sleep apnea) 10/13/2016   Mild with AHI 9.7/hr with significant oxygen desaturations as low as 76% now on CPAP  . Other peripheral vascular disease(443.89)    bilateral lower extremity  . Tobacco use disorder    dependent  . Transient global amnesia   . Tuberculosis   . Type II diabetes mellitus ([place])     Past  Surgical History:  Procedure Laterality Date  . A FLUTTER ABLATION N/A 12/28/2011   Procedure: ABLATION A FLUTTER;  Surgeon: Evans Lance, MD;  Location: [facility] CATH LAB;  Service: Cardiovascular;  Laterality: N/A;  . CARDIAC CATHETERIZATION N/A 07/10/2015   Procedure: Left Heart Cath and Coronary Angiography;  Surgeon: Sherren Mocha, MD;  Location: [place] CV LAB;  Service: Cardiovascular;  Laterality: N/A;  . CARDIAC ELECTROPHYSIOLOGY MAPPING AND ABLATION  03/2010  . CARDIOVERSION N/A 07/13/2016   Procedure: CARDIOVERSION;  Surgeon: Skeet Latch, MD;  Location: [place];  Service: Cardiovascular;  Laterality: N/A;  . COLONOSCOPY    . colonoscopy with polypectomy  2006 & 2011   Dr [name]  . CYSTOSCOPY  11/2007   Dr Jeffie Pollock  . PARTIAL HIP ARTHROPLASTY Right 01/2000   "Right; replaced ball & stem"  . PARTIAL HIP ARTHROPLASTY Left   . POLYPECTOMY    . TEE WITHOUT CARDIOVERSION  12/28/2011   Procedure: TRANSESOPHAGEAL ECHOCARDIOGRAM (TEE);  Surgeon: Larey Dresser, MD;  Location: [facility] ENDOSCOPY;  Service: Cardiovascular;  Laterality: N/A;    Family History  Problem Relation Age of Onset  . Stroke Mother 29  . Hypertension Mother   . Diabetes Mother   . Diabetes Paternal Grandmother   . Lung cancer Father        smoker  . Diabetes Brother   . Heart  attack Brother 83  . Diabetes Brother   . Hepatitis C Brother   . Throat cancer Paternal Uncle        1/2 uncle  . Heart attack Brother 72  . Crohn's disease Son   . Colon cancer Neg Hx   . Esophageal cancer Neg Hx   . Rectal cancer Neg Hx   . Stomach cancer Neg Hx     Social History:  reports that he has been smoking cigarettes.  He has a 44.00 pack-year smoking history. he has never used smokeless tobacco. He reports that he does not drink alcohol or use drugs.    Review of Systems       Lipids: He has  low HDL, LDL 48 ; this has been managed previously by PCP and cardiologis, taking Crestor 10 mg   Lab Results    Component Value Date   CHOL 97 05/25/2017   HDL 31.10 (L) 05/25/2017   LDLCALC 46 05/25/2017   TRIG 98.0 05/25/2017   CHOLHDL 3 05/25/2017              Has no numbness, tingling in the right first or second toes, no burning in feet  Diabetic foot exam in 12/17 showed normal monofilament sensation in the toes and plantar surfaces, no skin lesions or ulcers on the feet and absent pedal pulses  Hypertension: Blood pressure Appears well controlled  He has multiple cardiac and pulmonary issues and is using oxygen  Physical Examination:  BP 110/68 (BP Location: Left Arm, Patient Position: Sitting, Cuff Size: Normal)   Pulse 71   Ht _0  (1.803 m)   Wt 262 lb 9.6 oz (119.1 kg)   SpO2 93%   BMI 36.63 kg/m       ASSESSMENT:  Diabetes type 2, uncontrolled    See history of present illness for detailed discussion of current diabetes management, blood sugar patterns and problems identified  Currently on maximum dose of Victoza and metformin along with basal insulin with Levemir  Although his control is fairly good and he is generally watching his diet he still has difficulty losing weight This is partly related to difficulty with exercise He does have sporadic high readings partly related to lability and his diet and also occasionally forgetting his insulin Not clear if he is having 24 action of his Levemir insulin which he takes at night, very few readings being checked after supper   PLAN:  He will be given a trial of Lantus instead of Levemir, he will call when he is ready for a refill Discussed that if he has low normal sugars with this he may be able to cut it back by 2 units at least Encouraged him to try and check sugars more consistently at various times including after meals in the evening Consistent diet He can take his Lantus at suppertime for better compliance  Follow-up in 3 months     Patient Instructions  More sugars AFTER supper  CALL BEFORE Refilling  Levemir will try Lantus  Check blood sugars on waking up  3/7  Also check blood sugars about 2 hours after a meal and do this after different meals by rotation  Recommended blood sugar levels on waking up is 90-130 and about 2 hours after meal is 130-160  Please bring your blood sugar monitor to each visit, thank you      Elayne Snare 09/10/2017, 12:06 PM   Note: This office note was prepared with Dragon voice recognition system  technology. Any transcriptional errors that result from this process are unintentional.

## 2017-09-09 NOTE — Patient Instructions (Addendum)
More sugars AFTER supper  CALL BEFORE Refilling Levemir will try Lantus  Check blood sugars on waking up  3/7  Also check blood sugars about 2 hours after a meal and do this after different meals by rotation  Recommended blood sugar levels on waking up is 90-130 and about 2 hours after meal is 130-160  Please bring your blood sugar monitor to each visit, thank you

## 2017-09-23 DIAGNOSIS — M79671 Pain in right foot: Secondary | ICD-10-CM | POA: Diagnosis not present

## 2017-09-23 DIAGNOSIS — B353 Tinea pedis: Secondary | ICD-10-CM | POA: Diagnosis not present

## 2017-09-23 DIAGNOSIS — I739 Peripheral vascular disease, unspecified: Secondary | ICD-10-CM | POA: Diagnosis not present

## 2017-09-23 DIAGNOSIS — B351 Tinea unguium: Secondary | ICD-10-CM | POA: Diagnosis not present

## 2017-09-23 DIAGNOSIS — M79672 Pain in left foot: Secondary | ICD-10-CM | POA: Diagnosis not present

## 2017-09-23 DIAGNOSIS — E1351 Other specified diabetes mellitus with diabetic peripheral angiopathy without gangrene: Secondary | ICD-10-CM | POA: Diagnosis not present

## 2017-10-12 NOTE — Progress Notes (Addendum)
Cardiology Office Note:    Date:  10/13/2017   ID:  Neil Housekeeper Sr., DOB [birth year], MRN [id number]  PCP:  Hoyt Koch, MD  Cardiologist:  No primary care provider on file.    Referring MD: Hoyt Koch, *   Chief Complaint  Patient presents with  . Sleep Apnea  . Hypertension    History of Present Illness:    Neil Carroll. is a 67 y.o. male with a hx of mild OSA with an AHI of 9.7/hr with significant oxygen desatruations as low as 76% with documented nocturnal hypoxemia despite CPAP therapy.  He underwent CPAP titration to 13cm H20.  He is doing well with his CPAP device.  He tolerates the nasal mask and feels the pressure is adequate.  Since going on CPAP he feels rested in the am and has no significant daytime sleepiness but over the past few weeks has not been sleeping as well and so he feels tired int he am. He says if he gets hot at night he will start coughing and that wakes him up.  He is on chronic O2 24/7 at 4L and is followed by [name].  He occasionally will have some mouth dryness.  He does not know if he snores with the CPAP.  He says that his SOB has gotten worse this am and was very SOB walking from the car to the office and says he has never felt that bad. He has chronic LE edema which he thinks has gotten worse in the past few weeks.  He has gained 10lbs in the past week.    He denies any chest or pressure.  2D echo a few months ago showed normal LVF with trivial AI.     Past Medical History:  Diagnosis Date  . Adenomatous colon polyp   . Aortic insufficiency    Echo 3/18: Severe concentric LVH, EF 60-65, normal wall motion, moderate AI, mild LAE, mild TR // Echo 9/18: Mild concentric LVH, EF 60-65, normal wall motion, trivial AI, MAC, mild BAE  . Arthritis    left hip replacement  . Atrial flutter ([place])    onset  2011. s/p EPS/RFA 12/2011  . Cataract   . Chronic bronchitis   . Contact dermatitis and other eczema due to plants  (except food)   . COPD (chronic obstructive pulmonary disease) ([place])   . GERD (gastroesophageal reflux disease)   . Hyperlipidemia   . Hypertension   . Lung cancer ([place])   . LV dysfunction    EF 45-50% 12/2011  . OSA (obstructive sleep apnea) 10/13/2016   Mild with AHI 9.7/hr with significant oxygen desaturations as low as 76% now on CPAP  . Other peripheral vascular disease(443.89)    bilateral lower extremity  . Tobacco use disorder    dependent  . Transient global amnesia   . Tuberculosis   . Type II diabetes mellitus ([place])     Past Surgical History:  Procedure Laterality Date  . A FLUTTER ABLATION N/A 12/28/2011   Procedure: ABLATION A FLUTTER;  Surgeon: Evans Lance, MD;  Location: [facility] CATH LAB;  Service: Cardiovascular;  Laterality: N/A;  . CARDIAC CATHETERIZATION N/A 07/10/2015   Procedure: Left Heart Cath and Coronary Angiography;  Surgeon: Sherren Mocha, MD;  Location: [place] CV LAB;  Service: Cardiovascular;  Laterality: N/A;  . CARDIAC ELECTROPHYSIOLOGY MAPPING AND ABLATION  03/2010  . CARDIOVERSION N/A 07/13/2016   Procedure: CARDIOVERSION;  Surgeon: Skeet Latch, MD;  Location: [facility] ENDOSCOPY;  Service: Cardiovascular;  Laterality: N/A;  . COLONOSCOPY    . colonoscopy with polypectomy  2006 & 2011   Dr [name]  . CYSTOSCOPY  11/2007   Dr Jeffie Pollock  . PARTIAL HIP ARTHROPLASTY Right 01/2000   "Right; replaced ball & stem"  . PARTIAL HIP ARTHROPLASTY Left   . POLYPECTOMY    . TEE WITHOUT CARDIOVERSION  12/28/2011   Procedure: TRANSESOPHAGEAL ECHOCARDIOGRAM (TEE);  Surgeon: Larey Dresser, MD;  Location: [facility] ENDOSCOPY;  Service: Cardiovascular;  Laterality: N/A;    Current Medications: Current Meds  Medication Sig  . acetaminophen (TYLENOL) 650 MG CR tablet Take 650 mg by mouth every 8 (eight) hours as needed for pain.  . clobetasol (OLUX) 0.05 % topical foam Apply 1 application topically 2 (two) times daily as needed (skin).   . collagenase (SANTYL) ointment Apply  1 application topically daily as needed (dermal ulcer).   [name] diltiazem (CARTIA XT) 180 MG 24 hr capsule Take 1 capsule (180 mg total) by mouth 2 (two) times daily.  [name] etanercept (ENBREL SURECLICK) 50 MG/ML injection Inject 50 mg into the skin once a week. Inject on Sundays & Wednesdays   . Flaxseed, Linseed, (FLAX SEED OIL) 1000 MG CAPS Take 1 capsule by mouth 2 (two) times daily.   . fluticasone furoate-vilanterol (BREO ELLIPTA) 100-25 MCG/INH AEPB Inhale 1 puff into the lungs daily.  . furosemide (LASIX) 40 MG tablet take 2 tablets by mouth twice a day  . Insulin Detemir (LEVEMIR FLEXPEN) 100 UNIT/ML Pen Inject 28 Units daily at 10 pm into the skin.  [name] insulin detemir (LEVEMIR) 100 UNIT/ML injection Inject 28 Units into the skin at bedtime.   . liraglutide (VICTOZA) 18 MG/3ML SOPN INJECT 0.3ML (=1.8MG)      SUBCUTANEOUSLY DAILY  . losartan (COZAAR) 25 MG tablet Take 25 mg by mouth daily.  . metFORMIN (GLUCOPHAGE) 1000 MG tablet Take 1 tablet (1,000 mg total) 2 (two) times daily with a meal by mouth.  . metolazone (ZAROXOLYN) 2.5 MG tablet Take 2.5 mg by mouth as directed. Twice weekly  . metoprolol tartrate (LOPRESSOR) 100 MG tablet take1 tablet by mouth every morning and take 1 and 1/2 tablets by mouth every evening  . Multiple Vitamins-Minerals (CENTRUM ADULTS) TABS Take 1 tablet by mouth daily.  . mupirocin ointment (BACTROBAN) 2 % Apply 1 application topically 2 (two) times daily as needed (skin).   . Nintedanib (OFEV) 150 MG CAPS Take 150 mg by mouth 2 (two) times daily.  [name] NOVOFINE 32G X 6 MM MISC use twice a day  . Omega-3 Fatty Acids (FISH OIL) 1000 MG CAPS Take 1 capsule by mouth 2 (two) times daily.   Glory Rosebush DELICA LANCETS FINE MISC TEST BLOOD SUGAR DAILY  . potassium chloride SA (K-DUR,KLOR-CON) 20 MEQ tablet take 1 tablet by mouth twice a day  . rivaroxaban (XARELTO) 20 MG TABS tablet Take 20 mg by mouth every morning.   . rosuvastatin (CRESTOR) 10 MG tablet Take 1 tablet (10 mg  total) by mouth daily.  . tamsulosin (FLOMAX) 0.4 MG CAPS capsule Take 0.4 mg by mouth daily.   [name] triamcinolone cream (KENALOG) 0.1 % Apply 1 application topically 2 (two) times daily as needed (affected area/skin).      Allergies:   Niacin   Social History   Socioeconomic History  . Marital status: Widowed    Spouse name: None  . Number of children: 5  . Years of education: None  . Highest  education level: None  Social Needs  . Financial resource strain: None  . Food insecurity - worry: None  . Food insecurity - inability: None  . Transportation needs - medical: None  . Transportation needs - non-medical: None  Occupational History  . Occupation: security guard  Tobacco Use  . Smoking status: Current Every Day Smoker    Packs/day: 1.00    Years: 44.00    Pack years: 44.00    Types: Cigarettes  . Smokeless tobacco: Never Used  . Tobacco comment: started @ age 39, up to 2 ppd;1.25-1.5 ppd as of 11/23/13  Substance and Sexual Activity  . Alcohol use: No    Alcohol/week: 0.0 oz    Comment:  11/23/13 "2-3 drinks per year"  . Drug use: No  . Sexual activity: Not Currently  Other Topics Concern  . None  Social History Narrative  . None     Family History: The patient's family history includes Crohn's disease in his son; Diabetes in his brother, brother, mother, and paternal grandmother; Heart attack (age of onset: 17) in his brother; Heart attack (age of onset: 64) in his brother; Hepatitis C in his brother; Hypertension in his mother; Lung cancer in his father; Stroke (age of onset: 42) in his mother; Throat cancer in his paternal uncle. There is no history of Colon cancer, Esophageal cancer, Rectal cancer, or Stomach cancer.  ROS:   Please see the history of present illness.    ROS  All other systems reviewed and negative.   EKGs/Labs/Other Studies Reviewed:    The following studies were reviewed today: CPAP download  EKG:  EKG is ordered today and showed atrial  fibrillation at 95bpm with IRBBB  Recent Labs: 12/04/2016: NT-Pro BNP 628 05/25/2017: TSH 1.78 07/07/2017: HGB 16.2; Platelets 145 09/08/2017: ALT 13; BUN 17; Creatinine, Ser 1.38; Potassium 4.2; Sodium 137   Recent Lipid Panel    Component Value Date/Time   CHOL 97 05/25/2017 1158   TRIG 98.0 05/25/2017 1158   HDL 31.10 (L) 05/25/2017 1158   CHOLHDL 3 05/25/2017 1158   VLDL 19.6 05/25/2017 1158   LDLCALC 46 05/25/2017 1158    Physical Exam:    VS:  BP 110/70   Pulse (!) 106   Ht _0  (1.803 m)   Wt 270 lb 3.2 oz (122.6 kg)   SpO2 90%   BMI 37.69 kg/m     Wt Readings from Last 3 Encounters:  10/13/17 270 lb 3.2 oz (122.6 kg)  09/09/17 262 lb 9.6 oz (119.1 kg)  07/08/17 262 lb 14.4 oz (119.3 kg)     GEN:  Well nourished, well developed in no acute distress HEENT: Normal NECK: No JVD; No carotid bruits LYMPHATICS: No lymphadenopathy CARDIAC: RRR, no murmurs, rubs, gallops RESPIRATORY:  Diffuse rhonchi with expiratory wheezes ABDOMEN: Soft, non-tender, non-distended MUSCULOSKELETAL:  1-2+ edema; No deformity  SKIN: Warm and dry NEUROLOGIC:  Alert and oriented x 3 PSYCHIATRIC:  Normal affect   ASSESSMENT:    1. OSA (obstructive sleep apnea)   2. Hypertensive heart disease with other congestive heart failure (HCC)   3. Obesity (BMI 30-39.9)   4. Acute on chronic diastolic heart failure ([place])   5. SOB (shortness of breath)    PLAN:    In order of problems listed above:  1.  OSA - the patient is tolerating PAP therapy well without any problems. The PAP download was reviewed today and showed an AHI of 1.9/hr on 13 cm H2O with 77% compliance  in using more than 4 hours nightly.  The patient has been using and benefiting from PAP use and will continue to benefit from therapy.   2.  HTN - his BP is well controlled on exam today.  He will continue on Lopressor 149m BID, Cardizem XT  and Losartan 217mdaily.  Creatinine stable at 1.38 in 08/2017.  3.  Obesity - I have  encouraged him to get into a routine exercise program and cut back on carbs and portions.   4.  Chronic diastolic CHF - he appears volume overloaded on exam with increased LE edema and increased SOB.  His weight is up 8lbs.  He is in afib with fairly good rate control. I will check a BMET and BNP today.  I have instructed him to increase his Lasix to 804mID for 3 days and then back to 75m68mD. I will have him followup with Dr. CoopBurt Knack1-2 weeks.   5.  SOB - I suspect that this is mulitfactorial from COPD as well as acute on chronic diastolic CHF. He has a lot of wheezing on exam.  I am going to adjust diuretics for a few days and get in back in to see Dr. AlvaElsworth Sohoedication Adjustments/Labs and Tests Ordered: Current medicines are reviewed at length with the patient today.  Concerns regarding medicines are outlined above.  No orders of the defined types were placed in this encounter.  No orders of the defined types were placed in this encounter.   Signed, TracFransico Him  10/13/2017 11:39 AM    [facility]

## 2017-10-13 ENCOUNTER — Ambulatory Visit (INDEPENDENT_AMBULATORY_CARE_PROVIDER_SITE_OTHER): Payer: Medicare Other | Admitting: Cardiology

## 2017-10-13 ENCOUNTER — Encounter: Payer: Self-pay | Admitting: Cardiology

## 2017-10-13 VITALS — BP 110/70 | HR 106 | Ht 71.0 in | Wt 270.2 lb

## 2017-10-13 DIAGNOSIS — I110 Hypertensive heart disease with heart failure: Secondary | ICD-10-CM | POA: Diagnosis not present

## 2017-10-13 DIAGNOSIS — I5033 Acute on chronic diastolic (congestive) heart failure: Secondary | ICD-10-CM | POA: Diagnosis not present

## 2017-10-13 DIAGNOSIS — I5089 Other heart failure: Secondary | ICD-10-CM | POA: Diagnosis not present

## 2017-10-13 DIAGNOSIS — R0602 Shortness of breath: Secondary | ICD-10-CM | POA: Diagnosis not present

## 2017-10-13 DIAGNOSIS — G4733 Obstructive sleep apnea (adult) (pediatric): Secondary | ICD-10-CM | POA: Diagnosis not present

## 2017-10-13 DIAGNOSIS — E669 Obesity, unspecified: Secondary | ICD-10-CM

## 2017-10-13 MED ORDER — FUROSEMIDE 80 MG PO TABS: 80.0000 mg | ORAL_TABLET | Freq: Two times a day (BID) | ORAL | 3 refills | Status: DC

## 2017-10-13 NOTE — Patient Instructions (Signed)
Medication Instructions:  Your physician has recommended you make the following change in your medication:  INCREASE: lasix to 80 mg (1 tab) 3 times a day for 3 days. Then take Lasix 80 mg two times a day   If you need a refill on your cardiac medications, please contact your pharmacy first.  Labwork: Today for kidney function test, complete blood count, thyroid, and BNP  Testing/Procedures: None ordered   Follow-Up: Your physician recommends that you schedule a follow-up appointment in: 1-2 weeks with DR. Gay physician would like you to see [name] for COPD exacerbation   Any Other Special Instructions Will Be Listed Below (If Applicable).     If you need a refill on your cardiac medications before your next appointment, please call your pharmacy.

## 2017-10-13 NOTE — Addendum Note (Signed)
Addended by: Joaquim Lai on: 10/13/2017 12:54 PM   Modules accepted: Orders

## 2017-10-14 ENCOUNTER — Telehealth: Payer: Self-pay

## 2017-10-14 ENCOUNTER — Ambulatory Visit (INDEPENDENT_AMBULATORY_CARE_PROVIDER_SITE_OTHER): Payer: Medicare Other | Admitting: Internal Medicine

## 2017-10-14 ENCOUNTER — Encounter: Payer: Self-pay | Admitting: Internal Medicine

## 2017-10-14 DIAGNOSIS — F17210 Nicotine dependence, cigarettes, uncomplicated: Secondary | ICD-10-CM | POA: Diagnosis not present

## 2017-10-14 DIAGNOSIS — J9611 Chronic respiratory failure with hypoxia: Secondary | ICD-10-CM | POA: Diagnosis not present

## 2017-10-14 DIAGNOSIS — J441 Chronic obstructive pulmonary disease with (acute) exacerbation: Secondary | ICD-10-CM

## 2017-10-14 DIAGNOSIS — I5043 Acute on chronic combined systolic (congestive) and diastolic (congestive) heart failure: Secondary | ICD-10-CM

## 2017-10-14 DIAGNOSIS — F17200 Nicotine dependence, unspecified, uncomplicated: Secondary | ICD-10-CM

## 2017-10-14 DIAGNOSIS — I5033 Acute on chronic diastolic (congestive) heart failure: Secondary | ICD-10-CM | POA: Diagnosis not present

## 2017-10-14 LAB — CBC
HEMOGLOBIN: 17.1 g/dL (ref 13.0–17.7)
Hematocrit: 49 % (ref 37.5–51.0)
MCH: 31.7 pg (ref 26.6–33.0)
MCHC: 34.9 g/dL (ref 31.5–35.7)
MCV: 91 fL (ref 79–97)
PLATELETS: 150 10*3/uL (ref 150–379)
RBC: 5.39 x10E6/uL (ref 4.14–5.80)
RDW: 16.4 % — ABNORMAL HIGH (ref 12.3–15.4)
WBC: 8.9 10*3/uL (ref 3.4–10.8)

## 2017-10-14 LAB — BASIC METABOLIC PANEL
BUN / CREAT RATIO: 12 (ref 10–24)
BUN: 15 mg/dL (ref 8–27)
CALCIUM: 9 mg/dL (ref 8.6–10.2)
CO2: 24 mmol/L (ref 20–29)
CREATININE: 1.24 mg/dL (ref 0.76–1.27)
Chloride: 101 mmol/L (ref 96–106)
GFR calc Af Amer: 69 mL/min/{1.73_m2} (ref >59)
GFR calc non Af Amer: 60 mL/min/{1.73_m2} (ref >59)
Glucose: 116 mg/dL — ABNORMAL HIGH (ref 65–99)
Potassium: 3.8 mmol/L (ref 3.5–5.2)
Sodium: 142 mmol/L (ref 134–144)

## 2017-10-14 LAB — TSH: TSH: 1.09 u[IU]/mL (ref 0.450–4.500)

## 2017-10-14 LAB — PRO B NATRIURETIC PEPTIDE: NT-PRO BNP: 992 pg/mL — AB (ref 0–376)

## 2017-10-14 MED ORDER — FLUTICASONE-UMECLIDIN-VILANT 100-62.5-25 MCG/INH IN AEPB: 1.0000 | INHALATION_SPRAY | Freq: Every day | RESPIRATORY_TRACT | 11 refills | Status: AC

## 2017-10-14 NOTE — Assessment & Plan Note (Signed)
Rx for prednisone and doxycycline given 1 month course and hypoxia and SOB with minimal exertion. Change breo to trelegy for maximal therapy. Talked to him about the strong need to stop smoking in order to preserve his remaining lung function. Let him know that the medications cannot fight against the smoking and he will continue to decline if he continues to smoke. He is not willing to quit today.

## 2017-10-14 NOTE — Telephone Encounter (Signed)
Notes recorded by Teressa Senter, RN on 10/14/2017 at 10:21 AM EST Patient made aware of results and Dr. Theodosia Blender recommendation for repeat labs in 1 week. Patient in agreement with plan, verbalized understanding and thankful for the call. Patient scheduled for repeat labs on 10/21/17.   Notes recorded by Sueanne Margarita, MD on 10/14/2017 at 8:19 AM EST Please have patient come back for BMET and BNP in 1 week

## 2017-10-14 NOTE — Assessment & Plan Note (Signed)
His weight is still increased per our scales but he insists down 3 pounds since yesterday. Advised to continue increased dose of lasix as instructed as he is likely fluid overloaded still. Suspect some dyspnea is from ongoing smoking and lung disease as well.

## 2017-10-14 NOTE — Patient Instructions (Addendum)
We will change the breo to trilegy which is the same kind of inhaler but has 3 medicines in it to help with the breathing.   Stopping smoking is the best thing you can do with your health. The smoking is causing you a lot of breathing problems. If you continue to keep smoking your lungs will get worse and worse and you may get to a point where you cannot do much for yourself.

## 2017-10-14 NOTE — Assessment & Plan Note (Signed)
With hypoxia and on oxygen although he admits to poor compliance. He only wears to doctor visits so that they think he uses it. Does not feel a difference unless he is exerting himself and he admits that he does not do much at all.

## 2017-10-14 NOTE — Assessment & Plan Note (Signed)
Time spent counseling about tobacco usage: 4 minutes. I have asked about smoking and is smoking same as usual. The patient is advised to quit. The patient is not willing to quit. They would like to try to quit in the next 3 months. We will follow up with them in 3 months.

## 2017-10-14 NOTE — Progress Notes (Signed)
   Subjective:    Patient ID: Neil Mam., male    DOB: 1951/01/11, 67 y.o.   MRN: [id number]  HPI The patient is a 67 YO man coming in for SOB and cough. Going on for several months. He has not been seen in some time and has had lung cancer with resection since our last visit. He is following with pulmonary and they changed him from advair to breo at their last visit and are trying to get another medication approved. He has not done appeal letter he was supposed to do. He is having SOB with even minimal activity. He saw his cardiologist yesterday and states he was having a bad day. He was instructed to increase lasix as he was up about 10 pounds of fluid. He has lost about 3 pounds since that time. He is breathing better today but is still very SOB, has productive cough. He is still smoking and is smoking about 1 pack per day. Not willing to quit right now.   PMH, [facility], social history reviewed and updated.   Review of Systems  Constitutional: Positive for activity change, appetite change, chills and fatigue. Negative for fever and unexpected weight change.  HENT: Positive for congestion and postnasal drip. Negative for ear discharge, ear pain, rhinorrhea, sinus pressure, sinus pain, sneezing, sore throat, tinnitus, trouble swallowing and voice change.   Eyes: Negative.   Respiratory: Positive for cough, shortness of breath and wheezing. Negative for choking and chest tightness.   Cardiovascular: Negative.   Gastrointestinal: Negative.   Skin: Negative.   Neurological: Negative.  Negative for syncope, weakness and numbness.      Objective:   Physical Exam  Constitutional: He is oriented to person, place, and time. He appears well-developed and well-nourished.  HENT:  Head: Normocephalic and atraumatic.  Oropharynx with redness and clear drainage, nose with swollen turbinates, TMs normal bilaterally  Eyes: EOM are normal.  Neck: Normal range of motion. No thyromegaly present.    Cardiovascular: Normal rate and regular rhythm.  Pulmonary/Chest: Effort normal. No respiratory distress. He has wheezes. He has no rales.  Dyspnea after walking with SOB and able to speak in broken sentences, improving gradually to speaking in full sentences after about 5-10 minutes.   Abdominal: Soft.  Musculoskeletal: He exhibits tenderness.  Lymphadenopathy:    He has no cervical adenopathy.  Neurological: He is alert and oriented to person, place, and time.  Skin: Skin is warm and dry.   Vitals:   10/14/17 0828  BP: 100/68  Pulse: 70  Temp: 98.1 F (36.7 C)  TempSrc: Oral  SpO2: (!) 89%  Weight: 267 lb (121.1 kg)  Height: 5\' 11"  (1.803 m)      Assessment & Plan:

## 2017-10-15 ENCOUNTER — Ambulatory Visit (INDEPENDENT_AMBULATORY_CARE_PROVIDER_SITE_OTHER): Payer: Medicare Other | Admitting: Adult Health

## 2017-10-15 ENCOUNTER — Ambulatory Visit (INDEPENDENT_AMBULATORY_CARE_PROVIDER_SITE_OTHER)
Admission: RE | Admit: 2017-10-15 | Discharge: 2017-10-15 | Disposition: A | Discharge status: Home / Self Care | Payer: Medicare Other | Source: Ambulatory Visit | Attending: Adult Health | Admitting: Adult Health

## 2017-10-15 ENCOUNTER — Encounter: Payer: Self-pay | Admitting: Adult Health

## 2017-10-15 VITALS — BP 106/68 | HR 80 | Ht 71.0 in | Wt 265.0 lb

## 2017-10-15 DIAGNOSIS — I5033 Acute on chronic diastolic (congestive) heart failure: Secondary | ICD-10-CM

## 2017-10-15 DIAGNOSIS — R0602 Shortness of breath: Secondary | ICD-10-CM | POA: Diagnosis not present

## 2017-10-15 DIAGNOSIS — J9611 Chronic respiratory failure with hypoxia: Secondary | ICD-10-CM

## 2017-10-15 DIAGNOSIS — J441 Chronic obstructive pulmonary disease with (acute) exacerbation: Secondary | ICD-10-CM

## 2017-10-15 DIAGNOSIS — J84112 Idiopathic pulmonary fibrosis: Secondary | ICD-10-CM | POA: Diagnosis not present

## 2017-10-15 DIAGNOSIS — G4733 Obstructive sleep apnea (adult) (pediatric): Secondary | ICD-10-CM | POA: Diagnosis not present

## 2017-10-15 MED ORDER — PREDNISONE 10 MG PO TABS: ORAL_TABLET | ORAL | 0 refills | Status: DC

## 2017-10-15 NOTE — Assessment & Plan Note (Signed)
Cont on O2 .  

## 2017-10-15 NOTE — Assessment & Plan Note (Signed)
Mild flare in smoker , suspect a lot of symptoms are related to volume overload .  Will give small steroid burst.  Add albuterol neb but pt declines.  Check cxr today  Smoking cessation   Plan  Patient Instructions  Continue on Oxygen 4l/m with walking /activity .  Continue on CPAP At bedtime  With oxygen .  Work on not smoking .  Continue on TRELEGY daily , rinse after use.  Continue on Lasix per Cardiology instructions.  Chest xray today .  Call back if you change your mind on albuterol nebulizer.  Prednisone 20mg  daily for 3 days then 10mg  daily for 3 days and stop.  Please contact office for sooner follow up if symptoms do not improve or worsen or seek emergency care  Follow up with [name]  In 2 months and As needed

## 2017-10-15 NOTE — Patient Instructions (Addendum)
Continue on Oxygen 4l/m with walking /activity .  Continue on CPAP At bedtime  With oxygen .  Work on not smoking .  Continue on TRELEGY daily , rinse after use.  Continue on Lasix per Cardiology instructions.  Chest xray today .  Call back if you change your mind on albuterol nebulizer.  Prednisone 20mg  daily for 3 days then 10mg  daily for 3 days and stop.  Please contact office for sooner follow up if symptoms do not improve or worsen or seek emergency care  Follow up with [name]  In 2 months and As needed

## 2017-10-15 NOTE — Assessment & Plan Note (Signed)
Cont on CPAP w/ O2

## 2017-10-15 NOTE — Assessment & Plan Note (Signed)
Cont plan per cardiology w/ diuretics.

## 2017-10-15 NOTE — Assessment & Plan Note (Signed)
OFEV pending

## 2017-10-15 NOTE — Progress Notes (Signed)
@Patient  ID: Neil Mam., male    DOB: [birth year], 67 y.o.   MRN: [id number]  Chief Complaint  Patient presents with  . 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  HRCT 01/2017 >>probable IPF, 3.0 x 1.6 x 3.4 cm mass in the periphery of the right upper lobe  PFTs 02/2017-no airway obstruction, FVC 87%, TLC 73%, DLCO 33%   10/15/2017 Follow up : COPD , O2 RF , OSA on CPAP /O2 , smoker, D CHF  Patient presents for a follow-up.  Patient says that her breathing has not been doing as good over the last few weeks gets short of breath easily.  Has been retaining fluid.  He has been seen by cardiology and his diuretics have been adjusted.  Has noticed over the last 24 hours breathing is slightly better.  And leg swelling is down some with weight down 5 pounds.  Patient has probable IPF on CT chest.  He is currently awaiting approval of OFEV .   Patient continues to smoke.  Smoking cessation was discussed.  Quit smoking information was given to patient.  He remains on oxygen 4 L.  He admits he has not compliant with this at times.  Patient is encouraged on compliance with oxygen use and advised of dangers of smoking around any type of oxygen  device.  Cough and wheezing have picked up for last couple of weeks. No fever or discolored mucus .      Allergies  Allergen Reactions  . Niacin Nausea Only    REACTION: upset stomach    Immunization History  Administered Date(s) Administered  . Influenza Whole 08/08/2010  . Influenza, High Dose Seasonal PF 06/18/2016, 06/10/2017  . Influenza-Unspecified 06/20/2013, 06/22/2014, 06/22/2015  . Pneumococcal Conjugate-13 05/29/2015  . Pneumococcal Polysaccharide-23 09/06/2013  . Tdap 04/21/2011    Past Medical History:  Diagnosis Date  . Adenomatous colon polyp   . Aortic insufficiency    Echo 3/18: Severe concentric LVH, EF 60-65, normal wall motion, moderate AI, mild LAE, mild TR // Echo 9/18: Mild concentric LVH, EF 60-65, normal wall motion, trivial AI, MAC, mild BAE  . Arthritis    left hip replacement  . Atrial flutter ([place])    onset  2011. s/p EPS/RFA 12/2011  . Cataract   . Chronic bronchitis   . Contact dermatitis and other eczema due to plants (except food)   . COPD (chronic obstructive pulmonary disease) ([place])   . GERD (gastroesophageal reflux disease)   . Hyperlipidemia   . Hypertension   . Lung cancer ([place])   . LV dysfunction    EF 45-50% 12/2011  . OSA (obstructive sleep apnea) 10/13/2016   Mild with AHI 9.7/hr with significant oxygen desaturations as low as 76% now on CPAP  .  Other peripheral vascular disease(443.89)    bilateral lower extremity  . Tobacco use disorder    dependent  . Transient global amnesia   . Tuberculosis   . Type II diabetes mellitus (HCC)     Tobacco History: Social History   Tobacco Use  Smoking Status Current Every Day Smoker  . Packs/day: 1.00  . Years: 44.00  . Pack years: 44.00  . Types: Cigarettes  Smokeless Tobacco Never Used  Tobacco Comment   started @ age 54, up to 2 ppd;1.25-1.5 ppd as of 11/23/13   Ready to quit: No Counseling given: Not Answered Comment: started @ age 22, up to 2 ppd;1.25-1.5 ppd as of  11/23/13   Outpatient Encounter Medications as of 10/15/2017  Medication Sig  . acetaminophen (TYLENOL) 650 MG CR tablet Take 650 mg by mouth every 8 (eight) hours as needed for pain.  . clobetasol (OLUX) 0.05 % topical foam Apply 1 application topically 2 (two) times daily as needed (skin).   . collagenase (SANTYL) ointment Apply 1 application topically daily as needed (dermal ulcer).   [name] diltiazem (CARTIA XT) 180 MG 24 hr capsule Take 1 capsule (180 mg total) by mouth 2 (two) times daily.  [name] etanercept (ENBREL SURECLICK) 50 MG/ML injection Inject 50 mg into the skin once a week. Inject on Sundays & Wednesdays   . Flaxseed, Linseed, (FLAX SEED OIL) 1000 MG CAPS Take 1 capsule by mouth 2 (two) times daily.   . Fluticasone-Umeclidin-Vilant (TRELEGY ELLIPTA) 100-62.5-25 MCG/INH AEPB Inhale 1 puff into the lungs daily.  . furosemide (LASIX) 80 MG tablet Take 1 tablet (80 mg total) by mouth 2 (two) times daily. Take 80 mg (1 tab) three times a day for 3 days then take 80 mg twice a day  . Insulin Detemir (LEVEMIR FLEXPEN) 100 UNIT/ML Pen Inject 28 Units daily at 10 pm into the skin.  [name] insulin detemir (LEVEMIR) 100 UNIT/ML injection Inject 28 Units into the skin at bedtime.   . liraglutide (VICTOZA) 18 MG/3ML SOPN INJECT 0.3ML (=1.8MG)      SUBCUTANEOUSLY DAILY  . losartan (COZAAR) 25 MG tablet Take 25 mg by mouth daily.  . metFORMIN (GLUCOPHAGE) 1000 MG tablet Take 1 tablet (1,000 mg total) 2 (two) times daily with a meal by mouth.  . metolazone (ZAROXOLYN) 2.5 MG tablet Take 2.5 mg by mouth as directed. Twice weekly  . metoprolol tartrate (LOPRESSOR) 100 MG tablet take1 tablet by mouth every morning and take 1 and 1/2 tablets by mouth every evening  . Multiple Vitamins-Minerals (CENTRUM ADULTS) TABS Take 1 tablet by mouth daily.  . mupirocin ointment (BACTROBAN) 2 % Apply 1 application topically 2 (two) times daily as needed (skin).   . NOVOFINE 32G X 6 MM MISC use twice a day  . Omega-3 Fatty  Acids (FISH OIL) 1000 MG CAPS Take 1 capsule by mouth 2 (two) times daily.   Glory Rosebush DELICA LANCETS FINE MISC TEST BLOOD SUGAR DAILY  . potassium chloride SA (K-DUR,KLOR-CON) 20 MEQ tablet take 1 tablet by mouth twice a day  . rivaroxaban (XARELTO) 20 MG TABS tablet Take 20 mg by mouth every morning.   . rosuvastatin (CRESTOR) 10 MG tablet Take 1 tablet (10 mg total) by mouth daily.  . tamsulosin (FLOMAX) 0.4 MG CAPS capsule Take 0.4 mg by mouth daily.   [name] triamcinolone cream (KENALOG) 0.1 % Apply 1 application topically 2 (two) times daily as needed (affected area/skin).   . Nintedanib (OFEV) 150 MG CAPS Take 150  mg by mouth 2 (two) times daily. (Patient not taking: Reported on 10/15/2017)  . predniSONE (DELTASONE) 10 MG tablet 2 tabs for 3 days, then 1 tab for 3 days, then stop   No facility-administered encounter medications on file as of 10/15/2017.      Review of Systems  Constitutional:   No  weight loss, night sweats,  Fevers, chills,  +fatigue, or  lassitude.  HEENT:   No headaches,  Difficulty swallowing,  Tooth/dental problems, or  Sore throat,                No sneezing, itching, ear ache,  +nasal congestion, post nasal drip,   CV:  No chest pain,  Orthopnea, PND, +swelling in lower extremities, No  anasarca, dizziness, palpitations, syncope.   GI  No heartburn, indigestion, abdominal pain, nausea, vomiting, diarrhea, change in bowel habits, loss of appetite, bloody stools.   Resp:    No chest wall deformity  Skin: no rash or lesions.  GU: no dysuria, change in color of urine, no urgency or frequency.  No flank pain, no hematuria   MS:  No joint pain or swelling.  No decreased range of motion.  No back pain.    Physical Exam  BP 106/68 (BP Location: Left Arm, Cuff Size: Normal)   Pulse 80   Ht 5' 11"  (1.803 m)   Wt 265 lb (120.2 kg)   SpO2 90%   BMI 36.96 kg/m   GEN: A/Ox3; pleasant , NAD, obese    HEENT:  [place]/AT,  EACs-clear, TMs-wnl, NOSE-clear,  THROAT-clear, no lesions, no postnasal drip or exudate noted.   NECK:  Supple w/ fair ROM; no JVD; normal carotid impulses w/o bruits; no thyromegaly or nodules palpated; no lymphadenopathy.    RESP  BB crackles . no accessory muscle use, no dullness to percussion  CARD:  RRR, no m/r/g, 1=2 + peripheral edema, pulses intact, no cyanosis or clubbing.  GI:   Soft & nt; nml bowel sounds; no organomegaly or masses detected.   Musco: Warm bil, no deformities or joint swelling noted.   Neuro: alert, no focal deficits noted.    Skin: Warm, no lesions or rashes    Lab Results:  CBC   ProBNP    Component Value Date/Time   PROBNP 992 (H) 10/13/2017 1202    Imaging: Dg Chest 2 View  Result Date: 10/15/2017 CLINICAL DATA:  Increased shortness of breath. EXAM: CHEST  2 VIEW COMPARISON:  Chest CT 07/07/2017. Chest x-ray 05/25/2017. 02/25/2016. PET-CT 02/09/2017. FINDINGS: Mediastinum hilar structures normal. Stable cardiomegaly with normal pulmonary vascularity. Stable right upper nodular density and pleural thickening stable chronic interstitial changes bilaterally. COPD. No acute alveolar infiltrate. No pleural effusion or pneumothorax. IMPRESSION: 1. Stable right upper nodular density and pleural thickening. Pleural-based chronic interstitial lung disease, no change. COPD. 2.  Cardiomegaly.  No pulmonary venous congestion. Electronically Signed   By: Marcello Moores  Register   On: 10/15/2017 12:31     Assessment & Plan:   Bronchitis, chronic obstructive, with exacerbation ([place]) Mild flare in smoker , suspect a lot of symptoms are related to volume overload .  Will give small steroid burst.  Add albuterol neb but pt declines.  Check cxr today  Smoking cessation   Plan  Patient Instructions  Continue on Oxygen 4l/m with walking /activity .  Continue on CPAP At bedtime  With oxygen .  Work on not smoking .  Continue on TRELEGY daily , rinse after use.  Continue on Lasix  per Cardiology  instructions.  Chest xray today .  Call back if you change your mind on albuterol nebulizer.  Prednisone 68m daily for 3 days then 160mdaily for 3 days and stop.  Please contact office for sooner follow up if symptoms do not improve or worsen or seek emergency care  Follow up with Dr. AlElsworth SohoIn 2 months and As needed           Acute on chronic diastolic heart failure ([place] per cardiology w/ diuretics.   IPF (idiopathic pulmonary fibrosis) ([place]   Chronic respiratory failure (HCC) Cont on O2   OSA (obstructive sleep apnea) Cont on CPAP w/ O2      Persephonie Hegwood, NP 10/15/2017

## 2017-10-21 ENCOUNTER — Encounter: Payer: Self-pay | Admitting: Cardiovascular Disease

## 2017-10-21 ENCOUNTER — Ambulatory Visit (INDEPENDENT_AMBULATORY_CARE_PROVIDER_SITE_OTHER): Payer: Medicare Other | Admitting: Cardiovascular Disease

## 2017-10-21 ENCOUNTER — Encounter (INDEPENDENT_AMBULATORY_CARE_PROVIDER_SITE_OTHER): Payer: Self-pay

## 2017-10-21 ENCOUNTER — Other Ambulatory Visit: Payer: Medicare Other | Admitting: *Deleted

## 2017-10-21 VITALS — BP 118/50 | HR 86 | Ht 71.0 in | Wt 265.2 lb

## 2017-10-21 DIAGNOSIS — I5032 Chronic diastolic (congestive) heart failure: Secondary | ICD-10-CM | POA: Diagnosis not present

## 2017-10-21 DIAGNOSIS — I5043 Acute on chronic combined systolic (congestive) and diastolic (congestive) heart failure: Secondary | ICD-10-CM | POA: Diagnosis not present

## 2017-10-21 NOTE — Progress Notes (Signed)
Cardiology Office Note Date:  10/21/2017   ID:  Jb Dulworth., DOB 1950/11/10, MRN [id number]  PCP:  Hoyt Koch, MD  Cardiologist:  Sherren Mocha, MD    Chief Complaint  Patient presents with  . 1-2 week follow up  . Shortness of Breath     History of Present Illness: Neil Carroll. is a 67 y.o. male who presents for follow-up of heart failure and coronary artery disease.  Patient also is followed for atrial fibrillation and flutter, peripheral arterial disease, diabetes, ongoing tobacco abuse.  He is undergone radiation therapy for treatment of squamous cell lung cancer.  The patient is oxygen dependent, continues to smoke cigarettes.   He was recently seen by Rexene Edison, NP for a COPD exacerbation. Feeling much better after a short course of prednisone. Also doing better with increasing his diuretic dose.   He is here alone today.  Wearing his states that he wears it at night and when he goes to the doctor but otherwise really is not compliant with 24/7 home O2.  He is not watching his diet too carefully.  He has lost some weight since his diuretics were increased.  He continues now on a program of furosemide 80 mg twice daily and metolazone 2.5 mg twice weekly.  He admits that he has some trouble remembering the his edema in his legs is stable.  He is not short of breath at rest but does have shortness of breath with just about any activity.  He has had no recent chest pain or pressure.  No orthopnea or PND.   Past Medical History:  Diagnosis Date  . Adenomatous colon polyp   . Aortic insufficiency    Echo 3/18: Severe concentric LVH, EF 60-65, normal wall motion, moderate AI, mild LAE, mild TR // Echo 9/18: Mild concentric LVH, EF 60-65, normal wall motion, trivial AI, MAC, mild BAE  . Arthritis    left hip replacement  . Atrial flutter ([place])    onset  2011. s/p EPS/RFA 12/2011  . Cataract   . Chronic bronchitis   . Contact dermatitis and  other eczema due to plants (except food)   . COPD (chronic obstructive pulmonary disease) ([place])   . GERD (gastroesophageal reflux disease)   . Hyperlipidemia   . Hypertension   . Lung cancer ([place])   . LV dysfunction    EF 45-50% 12/2011  . OSA (obstructive sleep apnea) 10/13/2016   Mild with AHI 9.7/hr with significant oxygen desaturations as low as 76% now on CPAP  . Other peripheral vascular disease(443.89)    bilateral lower extremity  . Tobacco use disorder    dependent  . Transient global amnesia   . Tuberculosis   . Type II diabetes mellitus ([place])     Past Surgical History:  Procedure Laterality Date  . A FLUTTER ABLATION N/A 12/28/2011   Procedure: ABLATION A FLUTTER;  Surgeon: Evans Lance, MD;  Location: [facility] CATH LAB;  Service: Cardiovascular;  Laterality: N/A;  . CARDIAC CATHETERIZATION N/A 07/10/2015   Procedure: Left Heart Cath and Coronary Angiography;  Surgeon: Sherren Mocha, MD;  Location: [facility] CV LAB;  Service: Cardiovascular;  Laterality: N/A;  . CARDIAC ELECTROPHYSIOLOGY MAPPING AND ABLATION  03/2010  . CARDIOVERSION N/A 07/13/2016   Procedure: CARDIOVERSION;  Surgeon: Skeet Latch, MD;  Location: [place];  Service: Cardiovascular;  Laterality: N/A;  . COLONOSCOPY    . colonoscopy with polypectomy  2006 & 2011   Dr  [name]  . CYSTOSCOPY  11/2007   Dr Jeffie Pollock  . PARTIAL HIP ARTHROPLASTY Right 01/2000   "Right; replaced ball & stem"  . PARTIAL HIP ARTHROPLASTY Left   . POLYPECTOMY    . TEE WITHOUT CARDIOVERSION  12/28/2011   Procedure: TRANSESOPHAGEAL ECHOCARDIOGRAM (TEE);  Surgeon: Larey Dresser, MD;  Location: [facility] ENDOSCOPY;  Service: Cardiovascular;  Laterality: N/A;    Current Outpatient Medications  Medication Sig Dispense Refill  . acetaminophen (TYLENOL) 650 MG CR tablet Take 650 mg by mouth every 8 (eight) hours as needed for pain.    . clobetasol (OLUX) 0.05 % topical foam Apply 1 application topically 2 (two) times daily as needed (skin).    0  . collagenase (SANTYL) ointment Apply 1 application topically daily as needed (dermal ulcer).     [name] diltiazem (CARTIA XT) 180 MG 24 hr capsule Take 1 capsule (180 mg total) by mouth 2 (two) times daily. 180 capsule 3  . etanercept (ENBREL SURECLICK) 50 MG/ML injection Inject 50 mg into the skin once a week. Inject on Sundays & Wednesdays     . Flaxseed, Linseed, (FLAX SEED OIL) 1000 MG CAPS Take 1 capsule by mouth 2 (two) times daily.     . Fluticasone-Umeclidin-Vilant (TRELEGY ELLIPTA) 100-62.5-25 MCG/INH AEPB Inhale 1 puff into the lungs daily. 30 each 11  . furosemide (LASIX) 80 MG tablet Take 80 mg by mouth 2 (two) times daily.    . Insulin Detemir (LEVEMIR FLEXPEN) 100 UNIT/ML Pen Inject 28 Units daily at 10 pm into the skin. 15 mL 1  . insulin detemir (LEVEMIR) 100 UNIT/ML injection Inject 28 Units into the skin at bedtime.     . liraglutide (VICTOZA) 18 MG/3ML SOPN INJECT 0.3ML (=1.8MG)      SUBCUTANEOUSLY DAILY 27 mL 1  . losartan (COZAAR) 25 MG tablet Take 25 mg by mouth daily.    . metFORMIN (GLUCOPHAGE) 1000 MG tablet Take 1 tablet (1,000 mg total) 2 (two) times daily with a meal by mouth. 60 tablet 3  . metolazone (ZAROXOLYN) 2.5 MG tablet Take 2.5 mg by mouth as directed. Twice weekly  0  . metoprolol tartrate (LOPRESSOR) 100 MG tablet take1 tablet by mouth every morning and take 1 and 1/2 tablets by mouth every evening 225 tablet 3  . Multiple Vitamins-Minerals (CENTRUM ADULTS) TABS Take 1 tablet by mouth daily.    . mupirocin ointment (BACTROBAN) 2 % Apply 1 application topically 2 (two) times daily as needed (skin).     . Nintedanib (OFEV) 150 MG CAPS Take 150 mg by mouth 2 (two) times daily. 60 capsule 12  . NOVOFINE 32G X 6 MM MISC use twice a day 100 each 5  . Omega-3 Fatty Acids (FISH OIL) 1000 MG CAPS Take 1 capsule by mouth 2 (two) times daily.     Glory Rosebush DELICA LANCETS FINE MISC TEST BLOOD SUGAR DAILY 200 each 12  . potassium chloride SA (K-DUR,KLOR-CON) 20 MEQ  tablet take 1 tablet by mouth twice a day 180 tablet 3  . rivaroxaban (XARELTO) 20 MG TABS tablet Take 20 mg by mouth every morning.     . rosuvastatin (CRESTOR) 10 MG tablet Take 1 tablet (10 mg total) by mouth daily. 90 tablet 3  . tamsulosin (FLOMAX) 0.4 MG CAPS capsule Take 0.4 mg by mouth daily.     [name] triamcinolone cream (KENALOG) 0.1 % Apply 1 application topically 2 (two) times daily as needed (affected area/skin).      No current  facility-administered medications for this visit.     Allergies:   Niacin   Social History:  The patient  reports that he has been smoking cigarettes.  He has a 44.00 pack-year smoking history. he has never used smokeless tobacco. He reports that he does not drink alcohol or use drugs.   Family History:  The patient's  family history includes Crohn's disease in his son; Diabetes in his brother, brother, mother, and paternal grandmother; Heart attack (age of onset: 74) in his brother; Heart attack (age of onset: 61) in his brother; Hepatitis C in his brother; Hypertension in his mother; Lung cancer in his father; Stroke (age of onset: 45) in his mother; Throat cancer in his paternal uncle.    ROS:  Please see the history of present illness.  Otherwise, review of systems is positive for weight gain, leg swelling, cough, shortness of breath with activity, rash, easy bruising.  All other systems are reviewed and negative.    PHYSICAL EXAM: VS:  BP (!) 118/50   Pulse 86   Ht 5' 11"  (1.803 m)   Wt 265 lb 3.2 oz (120.3 kg)   SpO2 (!) 89%   BMI 36.99 kg/m  , BMI Body mass index is 36.99 kg/m. GEN: Well nourished, well developed, in no acute distress  HEENT: normal  Neck: Moderately elevated JVP, no masses. No carotid bruits Cardiac: Irregularly irregular without murmur or gallop                Respiratory:  clear to auscultation bilaterally, normal work of breathing GI: soft, nontender, nondistended, + BS MS: no deformity or atrophy  Ext: 2+ bilateral  pitting pretibial edema Skin: warm and dry, no rash Neuro:  Strength and sensation are intact Psych: euthymic mood, full affect  EKG:  EKG is not ordered today.  Recent Labs: 09/08/2017: ALT 13 10/13/2017: Hemoglobin 17.1; Platelets 150; TSH 1.090 10/21/2017: BUN 17; Creatinine, Ser 1.24; NT-Pro BNP WILL FOLLOW; Potassium 4.1; Sodium 141   Lipid Panel     Component Value Date/Time   CHOL 97 05/25/2017 1158   TRIG 98.0 05/25/2017 1158   HDL 31.10 (L) 05/25/2017 1158   CHOLHDL 3 05/25/2017 1158   VLDL 19.6 05/25/2017 1158   LDLCALC 46 05/25/2017 1158      Wt Readings from Last 3 Encounters:  10/21/17 265 lb 3.2 oz (120.3 kg)  10/15/17 265 lb (120.2 kg)  10/14/17 267 lb (121.1 kg)     Cardiac Studies Reviewed: 2D echocardiogram 05/28/2017: Study Conclusions  - Left ventricle: The cavity size was normal. There was mild   concentric hypertrophy. Systolic function was normal. The   estimated ejection fraction was in the range of 60% to 65%. Wall   motion was normal; there were no regional wall motion   abnormalities. - Aortic valve: There was trivial regurgitation. - Mitral valve: Calcified annulus. - Left atrium: The atrium was mildly dilated. - Right atrium: The atrium was mildly dilated. - Pulmonary arteries: Systolic pressure could not be accurately   estimated.  ASSESSMENT AND PLAN: 1.  Chronic diastolic heart failure: New York Heart Association functional class III symptoms likely combination of severe chronic lung disease and diastolic heart failure.  The patient is improved but continues to have some evidence of volume excess on exam.  He continues on Lasix 80 mg twice daily and metolazone 2.5 mg 2 times weekly with some degree of noncompliance.  We discussed sodium restriction at length.  He will have labs checked today.  We gave him a sliding scale to take an extra metolazone as needed for 3 pound weight gain.  2.  Persistent atrial fibrillation: Anticoagulated with  rivaroxaban.  Overall appears stable.  3.  Hypertensive heart disease with heart failure: As above.  Blood pressure is well controlled today.  4.  Coronary artery disease, native vessel, with angina: His medicine program is reviewed.  He is treated with a beta-blocker without symptoms at present.  5.  Hyperlipidemia: Treated with Crestor 10 mg daily.  6.  Chronic kidney disease: Will review labs today when obtained.  He is treated with losartan.  7.  Type 2 diabetes: Treated with insulin and oral hypoglycemic agents.  Counseling done today.  Current medicines are reviewed with the patient today.  The patient does not have concerns regarding medicines.  Labs/ tests ordered today include:  No orders of the defined types were placed in this encounter.   Disposition:   FU 3 moths Richardson Dopp, 6 months with me  Signed, Sherren Mocha, MD  10/21/2017 11:19 PM    [facility] [address], [place],   [address] Phone: [phone]; Fax: [phone]

## 2017-10-21 NOTE — Patient Instructions (Signed)
Medication Instructions:  If you are up 3 lbs, take one extra dose of Metolazone as needed.  Labwork: TODAY.  Testing/Procedures: None  Follow-Up: Your provider recommends that you schedule a follow-up appointment in 3 MONTHS with Neil Carroll.  Your provider wants you to follow-up in: 6 months with Dr. Burt Carroll. You will receive a reminder letter in the mail two months in advance. If you don't receive a letter, please call our office to schedule the follow-up appointment.    Any Other Special Instructions Will Be Listed Below (If Applicable).     If you need a refill on your cardiac medications before your next appointment, please call your pharmacy.

## 2017-10-22 LAB — BASIC METABOLIC PANEL
BUN / CREAT RATIO: 14 (ref 10–24)
BUN: 17 mg/dL (ref 8–27)
CHLORIDE: 101 mmol/L (ref 96–106)
CO2: 23 mmol/L (ref 20–29)
CREATININE: 1.24 mg/dL (ref 0.76–1.27)
Calcium: 8.9 mg/dL (ref 8.6–10.2)
GFR calc non Af Amer: 60 mL/min/{1.73_m2} (ref >59)
GFR, EST AFRICAN AMERICAN: 69 mL/min/{1.73_m2} (ref >59)
GLUCOSE: 104 mg/dL — AB (ref 65–99)
Potassium: 4.1 mmol/L (ref 3.5–5.2)
SODIUM: 141 mmol/L (ref 134–144)

## 2017-10-22 LAB — PRO B NATRIURETIC PEPTIDE: NT-PRO BNP: 962 pg/mL — AB (ref 0–376)

## 2017-10-27 DIAGNOSIS — N401 Enlarged prostate with lower urinary tract symptoms: Secondary | ICD-10-CM | POA: Diagnosis not present

## 2017-10-27 LAB — PSA: PSA: 1.24

## 2017-10-29 ENCOUNTER — Telehealth: Payer: Self-pay | Admitting: Pulmonary Disease

## 2017-10-29 MED ORDER — NINTEDANIB ESYLATE 150 MG PO CAPS: 150.0000 mg | ORAL_CAPSULE | Freq: Two times a day (BID) | ORAL | 11 refills | Status: AC

## 2017-10-29 NOTE — Telephone Encounter (Signed)
Received a letter from [employer] stating that pt's denial for Dickey Gave was overturned and has been approved from 09/26/17-04/24/18.    Per letter, pt has been made aware of approval.  Resent rx to [facility].  Nothing further needed at this time.

## 2017-11-03 DIAGNOSIS — R351 Nocturia: Secondary | ICD-10-CM | POA: Diagnosis not present

## 2017-11-03 DIAGNOSIS — N401 Enlarged prostate with lower urinary tract symptoms: Secondary | ICD-10-CM | POA: Diagnosis not present

## 2017-11-03 DIAGNOSIS — R350 Frequency of micturition: Secondary | ICD-10-CM | POA: Diagnosis not present

## 2017-11-03 DIAGNOSIS — N5201 Erectile dysfunction due to arterial insufficiency: Secondary | ICD-10-CM | POA: Diagnosis not present

## 2017-11-10 ENCOUNTER — Ambulatory Visit (INDEPENDENT_AMBULATORY_CARE_PROVIDER_SITE_OTHER): Payer: Medicare Other | Admitting: Internal Medicine

## 2017-11-10 ENCOUNTER — Encounter: Payer: Self-pay | Admitting: Internal Medicine

## 2017-11-10 DIAGNOSIS — I5089 Other heart failure: Secondary | ICD-10-CM

## 2017-11-10 DIAGNOSIS — I110 Hypertensive heart disease with heart failure: Secondary | ICD-10-CM

## 2017-11-10 DIAGNOSIS — J9611 Chronic respiratory failure with hypoxia: Secondary | ICD-10-CM

## 2017-11-10 DIAGNOSIS — E782 Mixed hyperlipidemia: Secondary | ICD-10-CM

## 2017-11-10 NOTE — Progress Notes (Signed)
   Subjective:    Patient ID: Buck Mam., male    DOB: 1951-01-30, 67 y.o.   MRN: [id number]  HPI The patient is a 67 YO man coming in for follow up of COPD (just finished prednisone from pulmonary, starting oref as it was approved by insurance, still smoking and no intention to quit, started trelegy since last visit and he has noticed mild benefit), and his cholesterol (taking crestor daily, denies new chest pains or headaches or stroke symptoms, denies side effects), and his blood pressure (taking diltiazem and metoprolol and lasix and losartan and metolazone, denies side effects, denies dizziness or headaches). Does have ongoing cough and SOB which are stable. Still smoking a lot and no desire to quit.   Review of Systems  Constitutional: Positive for activity change. Negative for appetite change, chills, fatigue, fever and unexpected weight change.  HENT: Negative.   Eyes: Negative.   Respiratory: Positive for cough, shortness of breath and wheezing. Negative for chest tightness.   Cardiovascular: Positive for leg swelling. Negative for chest pain and palpitations.  Gastrointestinal: Negative for abdominal distention, abdominal pain, constipation, diarrhea, nausea and vomiting.  Musculoskeletal: Negative.   Skin: Negative.   Neurological: Negative.   Psychiatric/Behavioral: Negative.       Objective:   Physical Exam  Constitutional: He is oriented to person, place, and time. He appears well-developed and well-nourished.  Obese  HENT:  Head: Normocephalic and atraumatic.  Eyes: EOM are normal.  Neck: Normal range of motion.  Cardiovascular: Normal rate and regular rhythm.  Pulmonary/Chest: Effort normal. No respiratory distress. He has wheezes. He has no rales.  Crackles and wheezing on exam, stable from prior  Abdominal: Soft. Bowel sounds are normal. He exhibits no distension. There is no tenderness. There is no rebound.  Musculoskeletal: He exhibits edema.  1-2+  edema, improved from prior, chronic venous stasis color change to bilateraly legs  Neurological: He is alert and oriented to person, place, and time. Coordination normal.  Skin: Skin is warm and dry.  Psychiatric: He has a normal mood and affect.   Vitals:   11/10/17 0922  BP: 100/64  Pulse: 81  Temp: 98.1 F (36.7 C)  TempSrc: Oral  SpO2: 91%  Weight: 266 lb (120.7 kg)  Height: 5\' 11"  (1.803 m)      Assessment & Plan:

## 2017-11-10 NOTE — Assessment & Plan Note (Signed)
Taking diltiazem, lasix, metolazone, metoprolol for BP and it is at goal. Recent BMP from cardiology reviewed with stable kidney function. No indication for change. HR at goal today.

## 2017-11-10 NOTE — Patient Instructions (Signed)
Keep working on the weight and call us or the heart doctor if the weight is increasing more than 3 pounds.

## 2017-11-10 NOTE — Assessment & Plan Note (Signed)
Taking crestor daily, prior LDL at goal. He is reminded to continue this medication to help reduce his risk of heart attack and stroke.

## 2017-11-10 NOTE — Assessment & Plan Note (Signed)
Uses oxygen only sometimes. Has noticed some benefit from change to trelegy and is starting oref and hopes that this will help. Declines any efforts to help him quit as he admits to being stubborn and will not quit even though he knows it is causing his health problems.

## 2017-11-16 ENCOUNTER — Encounter: Payer: Self-pay | Admitting: Internal Medicine

## 2017-11-20 ENCOUNTER — Other Ambulatory Visit: Payer: Self-pay | Admitting: Endocrinology

## 2017-11-24 ENCOUNTER — Encounter: Payer: Self-pay | Admitting: Pulmonary Disease

## 2017-11-24 ENCOUNTER — Other Ambulatory Visit (INDEPENDENT_AMBULATORY_CARE_PROVIDER_SITE_OTHER): Payer: Medicare Other

## 2017-11-24 ENCOUNTER — Ambulatory Visit (INDEPENDENT_AMBULATORY_CARE_PROVIDER_SITE_OTHER): Payer: Medicare Other | Admitting: Pulmonary Disease

## 2017-11-24 VITALS — BP 122/72 | HR 67 | Ht 71.0 in | Wt 271.0 lb

## 2017-11-24 DIAGNOSIS — I5032 Chronic diastolic (congestive) heart failure: Secondary | ICD-10-CM

## 2017-11-24 DIAGNOSIS — I110 Hypertensive heart disease with heart failure: Secondary | ICD-10-CM

## 2017-11-24 DIAGNOSIS — J84112 Idiopathic pulmonary fibrosis: Secondary | ICD-10-CM

## 2017-11-24 DIAGNOSIS — J9611 Chronic respiratory failure with hypoxia: Secondary | ICD-10-CM | POA: Diagnosis not present

## 2017-11-24 LAB — BASIC METABOLIC PANEL
BUN: 15 mg/dL (ref 6–23)
CO2: 31 meq/L (ref 19–32)
CREATININE: 1.33 mg/dL (ref 0.40–1.50)
Calcium: 9.7 mg/dL (ref 8.4–10.5)
Chloride: 101 mEq/L (ref 96–112)
GFR: 68.85 mL/min (ref >60.00)
Glucose, Bld: 125 mg/dL — ABNORMAL HIGH (ref 70–99)
Potassium: 3.9 mEq/L (ref 3.5–5.1)
SODIUM: 141 meq/L (ref 135–145)

## 2017-11-24 LAB — HEPATIC FUNCTION PANEL
ALBUMIN: 4.1 g/dL (ref 3.5–5.2)
ALT: 18 U/L (ref 0–53)
AST: 15 U/L (ref 0–37)
Alkaline Phosphatase: 55 U/L (ref 39–117)
BILIRUBIN DIRECT: 0.1 mg/dL (ref 0.0–0.3)
TOTAL PROTEIN: 8.1 g/dL (ref 6.0–8.3)
Total Bilirubin: 0.5 mg/dL (ref 0.2–1.2)

## 2017-11-24 MED ORDER — PREDNISONE 10 MG PO TABS: ORAL_TABLET | ORAL | 0 refills | Status: DC

## 2017-11-24 MED ORDER — AZITHROMYCIN 250 MG PO TABS: ORAL_TABLET | ORAL | 0 refills | Status: DC

## 2017-11-24 NOTE — Patient Instructions (Signed)
Prednisone 10 mg tabs  Take 2 tabs daily with food x 5ds, then 1 tab daily with food x 5ds then STOP  Zpak Blood work today for liver function tests Increase Lasix to 3 times daily for 3 days until his weight down to baseline

## 2017-11-24 NOTE — Assessment & Plan Note (Signed)
Needs 4 L continuous oxygen and pulse when he is acutely ill and then can hopefully go back to pulse and will reassess in 1 month

## 2017-11-24 NOTE — Progress Notes (Signed)
Subjective:    Patient ID: Neil Mam., male    DOB: 1951/02/06, 67 y.o.   MRN: [id number]  HPI  67 year old heavy smoker with chronic diastolic heart failure for FUof nocturnal hypoxia, ILD and COPD. He has CAD, chronic dCHF & chronic atrial fibrillation, underwent DC CV in 06/2016. He is maintained on Enbrel for psoriasis  He continues to smoke 5-10 cigs/d  He underwent SBRT to RUL  squamous cell lung cancer-stage TII N0 M0 02/2017   He started on O FEV about 3 weeks ago and seems to be tolerating well.  However he has developed some chest tightness over the last 1 week, cough productive of occasional yellow sputum.  His weight is increased from 262-271 pounds. He is compliant with Lasix twice a day in the past has decreased this to 3 times daily when he has developed fluid issues.  He denies preceding URI symptoms. His oxygen saturation was 86% on arrival on 4 L pulse and he was changed to 4 L continuous with improvement in the office.  He states that his main problem is inability to sleep, he has not used a CPAP in the last 2 nights.  Otherwise he remains his cantankerous self.  He had problems with his DME   Significant tests/ events reviewed  Echo 01/2016 shows normal LVEF with RVSP 39 05/2016 PSG showed mild OSA with AHI 10/hour with lowest desaturation of 76%-on a subsequent titration study, CPAP was titrated to 13 cm with good control of events and he needed 2 L of oxygen to correct for nocturnal hypoxia  Spirometry 09/2016 >>no evidence of airway obstruction with a ratio of 82 and FEV1 of 73% FVC of 68% suggestive of moderate restriction.  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 Adenomatous colon polyp   . Aortic insufficiency    Echo 3/18: Severe concentric LVH, EF 60-65, normal wall motion,  moderate AI, mild LAE, mild TR // Echo 9/18: Mild concentric LVH, EF 60-65, normal wall motion, trivial AI, MAC, mild BAE  . Arthritis    left hip replacement  . Atrial flutter ([place])    onset  2011. s/p EPS/RFA 12/2011  . Cataract   . Chronic bronchitis   . Contact dermatitis and other eczema due to plants (except food)   . COPD (chronic obstructive pulmonary disease) ([place])   . GERD (gastroesophageal reflux disease)   . Hyperlipidemia   . Hypertension   . Lung cancer ([place])   . LV dysfunction    EF 45-50% 12/2011  . OSA (obstructive sleep apnea) 10/13/2016   Mild with AHI 9.7/hr with significant oxygen desaturations as low as 76% now on CPAP  . Other peripheral vascular disease(443.89)    bilateral lower extremity  . Tobacco use disorder    dependent  . Transient global amnesia   . Tuberculosis   . Type II diabetes mellitus ([facility])      Review of Systems neg for any significant sore throat, dysphagia, itching, sneezing, nasal congestion or excess/ purulent secretions, fever, chills, sweats, unintended wt loss, pleuritic or exertional cp, hempoptysis, orthopnea pnd or change in chronic leg swelling.   Also denies presyncope, palpitations, heartburn, abdominal pain, nausea, vomiting, diarrhea or change in bowel or urinary habits, dysuria,hematuria, rash, arthralgias, visual complaints, headache, numbness weakness or ataxia.  Objective:   Physical Exam   Gen. Pleasant, obese, in no distress, normal affect ENT -  no post nasal drip, class 2-3 airway Neck: No JVD, no thyromegaly, no carotid bruits Lungs: no use of accessory muscles, no dullness to percussion, decreased without rales , faint BL rhonchi  Cardiovascular: Rhythm regular, heart sounds  normal, no murmurs or gallops, 2+ peripheral edema Abdomen: soft and non-tender, no hepatosplenomegaly, BS normal. Musculoskeletal: No deformities, no cyanosis or clubbing Neuro:  alert, non focal, no tremors         Assessment &  Plan:

## 2017-11-24 NOTE — Assessment & Plan Note (Signed)
toelrating OFEV Check LFTs today. Reassured him that his chest tightness and wheezing is more likely related to fluid overload rather than side effect of ofev

## 2017-11-24 NOTE — Assessment & Plan Note (Signed)
He is more hypoxic today and this may be due to COPD exacerbation or fluid overload from acute on chronic diastolic CHF  Prednisone 10 mg tabs  Take 2 tabs daily with food x 5ds, then 1 tab daily with food x 5ds then STOP  Zpak Ct trelegy

## 2017-11-24 NOTE — Assessment & Plan Note (Signed)
Increase Lasix to 3 times daily for 3 days until his weight down to baseline

## 2017-11-29 ENCOUNTER — Ambulatory Visit: Payer: Medicare Other | Admitting: Pulmonary Disease

## 2017-12-02 DIAGNOSIS — M79671 Pain in right foot: Secondary | ICD-10-CM | POA: Diagnosis not present

## 2017-12-02 DIAGNOSIS — B351 Tinea unguium: Secondary | ICD-10-CM | POA: Diagnosis not present

## 2017-12-02 DIAGNOSIS — E1351 Other specified diabetes mellitus with diabetic peripheral angiopathy without gangrene: Secondary | ICD-10-CM | POA: Diagnosis not present

## 2017-12-02 DIAGNOSIS — M79672 Pain in left foot: Secondary | ICD-10-CM | POA: Diagnosis not present

## 2017-12-08 ENCOUNTER — Ambulatory Visit (INDEPENDENT_AMBULATORY_CARE_PROVIDER_SITE_OTHER): Payer: Medicare Other | Admitting: Endocrinology

## 2017-12-08 ENCOUNTER — Encounter: Payer: Self-pay | Admitting: Endocrinology

## 2017-12-08 VITALS — BP 102/54 | HR 86 | Wt 269.0 lb

## 2017-12-08 DIAGNOSIS — E1165 Type 2 diabetes mellitus with hyperglycemia: Secondary | ICD-10-CM | POA: Diagnosis not present

## 2017-12-08 DIAGNOSIS — Z794 Long term (current) use of insulin: Secondary | ICD-10-CM | POA: Diagnosis not present

## 2017-12-08 LAB — HEMOGLOBIN A1C: Hgb A1c MFr Bld: 7.3 % — ABNORMAL HIGH (ref 4.6–6.5)

## 2017-12-08 MED ORDER — INSULIN GLARGINE 100 UNIT/ML SOLOSTAR PEN: 30.0000 [IU] | PEN_INJECTOR | Freq: Every day | SUBCUTANEOUS | 1 refills | Status: AC

## 2017-12-08 NOTE — Progress Notes (Signed)
Patient ID: Neil Carroll., male   DOB: 09-Jun-1951, 67 y.o.   MRN: [id number]   Reason for Appointment : Followup for Type 2 Diabetes  History of Present Illness          Diagnosis: Type 2 diabetes mellitus, date of diagnosis:  2008     Past history: He has been treated with metformin only for several years and had relatively good control until about a year ago. However had been taking only 1000 mg of metformin. Because of his progressively higher blood sugars he had been started on Levemir insulin since 6/14.  However with this his A1c was still 10.2 in 9/14 and he was referred here With increasing Victoza to 1.8 mg in 3/15 his weight had come down and his blood sugars had improved   Recent history:   INSULIN regimen is described as:  Levemir 28 units hs   Non-insulin hypoglycemic drugs the patient is taking are: Metformin 2g, Victoza 1.8 mg daily  His A1c is previously 7.4  Current management, blood sugar patterns and problems identified:  He was advised to switch to Lantus instead of Levemir but he did not call when he refilled his prescription to switch  Again he has checked blood sugars very infrequently and has only 5 readings in the last months  He thinks his test strips are not expired  He has a couple of readings in the mornings at home that are high at 150 and 181 but his lab glucose was 125 possibly fasting late morning  He also says that he may have high readings when his sleep is disturbed at night  Highest reading was 190 about 1 AM and he cannot remember why it was high  Has a couple of readings of 104 and 117 in the afternoon but no readings after evening meal as recommended  He thinks he is very regular with taking his insulin and Victoza consistently around 11 PM at night  Still not able to to exercise because of fatigue and shortness of breath   Side effects from medications have been: None Compliance with the medical regimen: Fair     Glucose monitoring: Irregular        Glucometer: One Touch.      Blood Glucose readings from download: As above with median 150  Hypoglycemia frequency:  .           Self-care:   Meals: 2/day. 11 am 6 pm.  He is eating out at times Tries to cut back on sodium intake Oatmeal in am for breakfast  Physical activity: exercise:  Doing minimal      Dietician visit: Most recent: 09/2013                 Wt Readings from Last 3 Encounters:  12/08/17 269 lb (122 kg)  11/24/17 271 lb (122.9 kg)  11/10/17 266 lb (120.7 kg)       Lab Results  Component Value Date   HGBA1C 7.4 (H) 09/08/2017   HGBA1C 7.2 (H) 05/25/2017   HGBA1C 7.6 (H) 03/09/2017   Lab Results  Component Value Date   MICROALBUR 3.4 (H) 06/10/2017   LDLCALC 46 05/25/2017   CREATININE 1.33 11/24/2017   No visits with results within 1 Week(s) from this visit.  Latest known visit with results is:  Appointment on 11/24/2017  Component Date Value Ref Range Status  . Sodium 11/24/2017 141  135 - 145  mEq/L Final  . Potassium 11/24/2017 3.9  3.5 - 5.1 mEq/L Final  . Chloride 11/24/2017 101  96 - 112 mEq/L Final  . CO2 11/24/2017 31  19 - 32 mEq/L Final  . Glucose, Bld 11/24/2017 125* 70 - 99 mg/dL Final  . BUN 11/24/2017 15  6 - 23 mg/dL Final  . Creatinine, Ser 11/24/2017 1.33  0.40 - 1.50 mg/dL Final  . Calcium 11/24/2017 9.7  8.4 - 10.5 mg/dL Final  . GFR 11/24/2017 68.85  >60.00 mL/min Final  . Total Bilirubin 11/24/2017 0.5  0.2 - 1.2 mg/dL Final  . Bilirubin, Direct 11/24/2017 0.1  0.0 - 0.3 mg/dL Final  . Alkaline Phosphatase 11/24/2017 55  39 - 117 U/L Final  . AST 11/24/2017 15  0 - 37 U/L Final  . ALT 11/24/2017 18  0 - 53 U/L Final  . Total Protein 11/24/2017 8.1  6.0 - 8.3 g/dL Final  . Albumin 11/24/2017 4.1  3.5 - 5.2 g/dL Final     Allergies as of 12/08/2017      Reactions   Niacin Nausea Only   REACTION: upset stomach      Medication List        Accurate as of 12/08/17 11:41 AM. Always use  your most recent med list.          acetaminophen 650 MG CR tablet Commonly known as:  TYLENOL Take 650 mg by mouth every 8 (eight) hours as needed for pain.   CENTRUM ADULTS Tabs Take 1 tablet by mouth daily.   clobetasol 0.05 % topical foam Commonly known as:  OLUX Apply 1 application topically 2 (two) times daily as needed (skin).   collagenase ointment Commonly known as:  SANTYL Apply 1 application topically daily as needed (dermal ulcer).   diltiazem 180 MG 24 hr capsule Commonly known as:  CARTIA XT Take 1 capsule (180 mg total) by mouth 2 (two) times daily.   ENBREL SURECLICK 50 MG/ML injection Generic drug:  etanercept Inject 50 mg into the skin once a week. Inject on Sundays & Wednesdays   Fish Oil 1000 MG Caps Take 1 capsule by mouth 2 (two) times daily.   Flax Seed Oil 1000 MG Caps Take 1 capsule by mouth 2 (two) times daily.   Fluticasone-Umeclidin-Vilant 100-62.5-25 MCG/INH Aepb Commonly known as:  TRELEGY ELLIPTA Inhale 1 puff into the lungs daily.   furosemide 80 MG tablet Commonly known as:  LASIX Take 80 mg by mouth 2 (two) times daily.   Insulin Glargine 100 UNIT/ML Solostar Pen Commonly known as:  LANTUS SOLOSTAR Inject 30 Units into the skin daily at 10 pm.   LEVEMIR 100 UNIT/ML injection Generic drug:  insulin detemir Inject 28 Units into the skin at bedtime.   Insulin Detemir 100 UNIT/ML Pen Commonly known as:  LEVEMIR FLEXPEN Inject 28 Units daily at 10 pm into the skin.   liraglutide 18 MG/3ML Sopn Commonly known as:  VICTOZA INJECT 0.3ML (=1.8MG)      SUBCUTANEOUSLY DAILY   losartan 25 MG tablet Commonly known as:  COZAAR Take 25 mg by mouth daily.   metFORMIN 1000 MG tablet Commonly known as:  GLUCOPHAGE TAKE 1 TABLET BY MOUTH TWICE A DAY WITH FOOD   metolazone 2.5 MG tablet Commonly known as:  ZAROXOLYN Take 2.5 mg by mouth as directed. Twice weekly   metoprolol tartrate 100 MG tablet Commonly known as:   LOPRESSOR take1 tablet by mouth every morning and take 1 and 1/2 tablets by  mouth every evening   mupirocin ointment 2 % Commonly known as:  BACTROBAN Apply 1 application topically 2 (two) times daily as needed (skin).   Nintedanib 150 MG Caps Commonly known as:  OFEV Take 1 capsule (150 mg total) by mouth 2 (two) times daily.   NOVOFINE 32G X 6 MM Misc Generic drug:  Insulin Pen Needle use twice a day   ONETOUCH DELICA LANCETS FINE Misc TEST BLOOD SUGAR DAILY   potassium chloride SA 20 MEQ tablet Commonly known as:  K-DUR,KLOR-CON take 1 tablet by mouth twice a day   rivaroxaban 20 MG Tabs tablet Commonly known as:  XARELTO Take 20 mg by mouth every morning.   rosuvastatin 10 MG tablet Commonly known as:  CRESTOR Take 1 tablet (10 mg total) by mouth daily.   tamsulosin 0.4 MG Caps capsule Commonly known as:  FLOMAX Take 0.4 mg by mouth daily.   triamcinolone cream 0.1 % Commonly known as:  KENALOG Apply 1 application topically 2 (two) times daily as needed (affected area/skin).       Allergies:  Allergies  Allergen Reactions  . Niacin Nausea Only    REACTION: upset stomach    Past Medical History:  Diagnosis Date  . Adenomatous colon polyp   . Aortic insufficiency    Echo 3/18: Severe concentric LVH, EF 60-65, normal wall motion, moderate AI, mild LAE, mild TR // Echo 9/18: Mild concentric LVH, EF 60-65, normal wall motion, trivial AI, MAC, mild BAE  . Arthritis    left hip replacement  . Atrial flutter ([place])    onset  2011. s/p EPS/RFA 12/2011  . Cataract   . Chronic bronchitis   . Contact dermatitis and other eczema due to plants (except food)   . COPD (chronic obstructive pulmonary disease) ([place])   . GERD (gastroesophageal reflux disease)   . Hyperlipidemia   . Hypertension   . Lung cancer ([place])   . LV dysfunction    EF 45-50% 12/2011  . OSA (obstructive sleep apnea) 10/13/2016   Mild with AHI 9.7/hr with significant oxygen desaturations as low as  76% now on CPAP  . Other peripheral vascular disease(443.89)    bilateral lower extremity  . Tobacco use disorder    dependent  . Transient global amnesia   . Tuberculosis   . Type II diabetes mellitus ([place])     Past Surgical History:  Procedure Laterality Date  . A FLUTTER ABLATION N/A 12/28/2011   Procedure: ABLATION A FLUTTER;  Surgeon: Evans Lance, MD;  Location: [facility] CATH LAB;  Service: Cardiovascular;  Laterality: N/A;  . CARDIAC CATHETERIZATION N/A 07/10/2015   Procedure: Left Heart Cath and Coronary Angiography;  Surgeon: Sherren Mocha, MD;  Location: [place] CV LAB;  Service: Cardiovascular;  Laterality: N/A;  . CARDIAC ELECTROPHYSIOLOGY MAPPING AND ABLATION  03/2010  . CARDIOVERSION N/A 07/13/2016   Procedure: CARDIOVERSION;  Surgeon: Skeet Latch, MD;  Location: [place];  Service: Cardiovascular;  Laterality: N/A;  . COLONOSCOPY    . colonoscopy with polypectomy  2006 & 2011   Dr [name]  . CYSTOSCOPY  11/2007   Dr Jeffie Pollock  . PARTIAL HIP ARTHROPLASTY Right 01/2000   "Right; replaced ball & stem"  . PARTIAL HIP ARTHROPLASTY Left   . POLYPECTOMY    . TEE WITHOUT CARDIOVERSION  12/28/2011   Procedure: TRANSESOPHAGEAL ECHOCARDIOGRAM (TEE);  Surgeon: Larey Dresser, MD;  Location: [facility] ENDOSCOPY;  Service: Cardiovascular;  Laterality: N/A;    Family History  Problem Relation Age  of Onset  . Stroke Mother 81  . Hypertension Mother   . Diabetes Mother   . Diabetes Paternal Grandmother   . Lung cancer Father        smoker  . Diabetes Brother   . Heart attack Brother 25  . Diabetes Brother   . Hepatitis C Brother   . Throat cancer Paternal Uncle        1/2 uncle  . Heart attack Brother 58  . Crohn's disease Son   . Colon cancer Neg Hx   . Esophageal cancer Neg Hx   . Rectal cancer Neg Hx   . Stomach cancer Neg Hx     Social History:  reports that he has been smoking cigarettes.  He has a 44.00 pack-year smoking history. he has never used smokeless tobacco.  He reports that he does not drink alcohol or use drugs.    Review of Systems       Lipids: He has  low HDL  this has been managed previously by PCP and cardiologist, taking Crestor 10 mg   Lab Results  Component Value Date   CHOL 97 05/25/2017   HDL 31.10 (L) 05/25/2017   LDLCALC 46 05/25/2017   TRIG 98.0 05/25/2017   CHOLHDL 3 05/25/2017              Has no numbness, tingling or burning in his feet or toes, has seen podiatrist also  Diabetic foot exam in 3/19 showed normal monofilament sensation in the toes but possibly decreased in the plantar surfaces, no skin lesions or ulcers on the feet and absent pedal pulses    He has multiple cardiac and pulmonary issues and is using oxygen  Physical Examination:  BP (!) 102/54 (BP Location: Left Arm, Patient Position: Sitting)   Pulse 86   Wt 269 lb (122 kg)   SpO2 94%   BMI 37.52 kg/m     Diabetic Foot Exam - Simple   Simple Foot Form Diabetic Foot exam was performed with the following findings:  Yes   Visual Inspection No deformities, no ulcerations, no other skin breakdown bilaterally:  Yes See comments:  Yes Sensation Testing See comments:  Yes Pulse Check See comments:  Yes Comments 2+ edema present Stasis changes on the lower leg and feet present Some dryness of the skin and scattered calluses on the plantar surfaces Monofilament sensation normal on the distal toes superiorly but patchy decrease on the distal plantar surfaces Pedal pulses not palpable partly because of edema      ASSESSMENT:  Diabetes type 2, uncontrolled on insulin and with obesity    See history of present illness for detailed discussion of current diabetes management, blood sugar patterns and problems identified  Currently on maximum dose of Victoza and metformin along with basal insulin with Levemir  A1c is to be done today He is checking blood sugars infrequently and difficult to get a blood sugar pattern Currently taking basal  insulin and Victoza Not clear if he has higher blood sugars after evening meal which is his main meal   PLAN:  He needs to switch to Lantus to see if his blood sugars will be more consistent However he will need to call us if his readings after supper are high and consider adding Humalog at suppertime Consultation with dietitian as he needs to lose weight Follow-up in 3 months  He will discuss his edema with cardiologist   Patient Instructions  Check blood sugars on waking up  3/7  Also check blood sugars about 2 hours after a meal and do this after different meals by rotation  Recommended blood sugar levels on waking up is 90-130 and about 2 hours after meal is 130-160  Please bring your blood sugar monitor to each visit, thank you  Must check sugars at nite at 10 pm      Elayne Snare 12/08/2017, 11:41 AM   Note: This office note was prepared with Dragon voice recognition system technology. Any transcriptional errors that result from this process are unintentional.

## 2017-12-08 NOTE — Patient Instructions (Addendum)
Check blood sugars on waking up  3/7  Also check blood sugars about 2 hours after a meal and do this after different meals by rotation  Recommended blood sugar levels on waking up is 90-130 and about 2 hours after meal is 130-160  Please bring your blood sugar monitor to each visit, thank you  Must check sugars at nite at 10 pm

## 2017-12-19 ENCOUNTER — Other Ambulatory Visit: Payer: Self-pay | Admitting: Endocrinology

## 2017-12-23 ENCOUNTER — Other Ambulatory Visit: Payer: Self-pay | Admitting: Cardiovascular Disease

## 2017-12-24 ENCOUNTER — Encounter (HOSPITAL_COMMUNITY): Payer: Self-pay | Admitting: Emergency Medicine

## 2017-12-24 ENCOUNTER — Emergency Department (HOSPITAL_COMMUNITY): Payer: Medicare Other

## 2017-12-24 ENCOUNTER — Ambulatory Visit: Payer: Self-pay | Admitting: *Deleted

## 2017-12-24 ENCOUNTER — Observation Stay (HOSPITAL_COMMUNITY): Payer: Medicare Other

## 2017-12-24 ENCOUNTER — Inpatient Hospital Stay (HOSPITAL_COMMUNITY)
Admission: EM | Admit: 2017-12-24 | Discharge: 2017-12-29 | DRG: 189 | Disposition: A | Discharge status: Home / Self Care | Payer: Medicare Other | Attending: Internal Medicine | Admitting: Internal Medicine

## 2017-12-24 ENCOUNTER — Other Ambulatory Visit: Payer: Self-pay

## 2017-12-24 DIAGNOSIS — J9610 Chronic respiratory failure, unspecified whether with hypoxia or hypercapnia: Secondary | ICD-10-CM | POA: Diagnosis present

## 2017-12-24 DIAGNOSIS — I272 Pulmonary hypertension, unspecified: Secondary | ICD-10-CM | POA: Diagnosis present

## 2017-12-24 DIAGNOSIS — E118 Type 2 diabetes mellitus with unspecified complications: Secondary | ICD-10-CM | POA: Diagnosis not present

## 2017-12-24 DIAGNOSIS — I5033 Acute on chronic diastolic (congestive) heart failure: Secondary | ICD-10-CM | POA: Diagnosis not present

## 2017-12-24 DIAGNOSIS — N183 Chronic kidney disease, stage 3 (moderate): Secondary | ICD-10-CM | POA: Diagnosis present

## 2017-12-24 DIAGNOSIS — Z96642 Presence of left artificial hip joint: Secondary | ICD-10-CM | POA: Diagnosis present

## 2017-12-24 DIAGNOSIS — L409 Psoriasis, unspecified: Secondary | ICD-10-CM | POA: Diagnosis present

## 2017-12-24 DIAGNOSIS — Z6837 Body mass index (BMI) 37.0-37.9, adult: Secondary | ICD-10-CM

## 2017-12-24 DIAGNOSIS — T380X5A Adverse effect of glucocorticoids and synthetic analogues, initial encounter: Secondary | ICD-10-CM | POA: Diagnosis present

## 2017-12-24 DIAGNOSIS — I10 Essential (primary) hypertension: Secondary | ICD-10-CM | POA: Diagnosis not present

## 2017-12-24 DIAGNOSIS — I481 Persistent atrial fibrillation: Secondary | ICD-10-CM | POA: Diagnosis present

## 2017-12-24 DIAGNOSIS — Z794 Long term (current) use of insulin: Secondary | ICD-10-CM | POA: Diagnosis not present

## 2017-12-24 DIAGNOSIS — I5031 Acute diastolic (congestive) heart failure: Secondary | ICD-10-CM | POA: Diagnosis not present

## 2017-12-24 DIAGNOSIS — J700 Acute pulmonary manifestations due to radiation: Secondary | ICD-10-CM | POA: Diagnosis present

## 2017-12-24 DIAGNOSIS — R531 Weakness: Secondary | ICD-10-CM | POA: Diagnosis not present

## 2017-12-24 DIAGNOSIS — I2510 Atherosclerotic heart disease of native coronary artery without angina pectoris: Secondary | ICD-10-CM | POA: Diagnosis present

## 2017-12-24 DIAGNOSIS — J9620 Acute and chronic respiratory failure, unspecified whether with hypoxia or hypercapnia: Secondary | ICD-10-CM | POA: Diagnosis present

## 2017-12-24 DIAGNOSIS — C3411 Malignant neoplasm of upper lobe, right bronchus or lung: Secondary | ICD-10-CM | POA: Diagnosis not present

## 2017-12-24 DIAGNOSIS — F17200 Nicotine dependence, unspecified, uncomplicated: Secondary | ICD-10-CM

## 2017-12-24 DIAGNOSIS — R5383 Other fatigue: Secondary | ICD-10-CM | POA: Diagnosis not present

## 2017-12-24 DIAGNOSIS — J181 Lobar pneumonia, unspecified organism: Secondary | ICD-10-CM

## 2017-12-24 DIAGNOSIS — J189 Pneumonia, unspecified organism: Secondary | ICD-10-CM | POA: Diagnosis not present

## 2017-12-24 DIAGNOSIS — J441 Chronic obstructive pulmonary disease with (acute) exacerbation: Secondary | ICD-10-CM

## 2017-12-24 DIAGNOSIS — J84112 Idiopathic pulmonary fibrosis: Secondary | ICD-10-CM | POA: Diagnosis present

## 2017-12-24 DIAGNOSIS — C3491 Malignant neoplasm of unspecified part of right bronchus or lung: Secondary | ICD-10-CM | POA: Diagnosis present

## 2017-12-24 DIAGNOSIS — I4891 Unspecified atrial fibrillation: Secondary | ICD-10-CM | POA: Diagnosis not present

## 2017-12-24 DIAGNOSIS — E669 Obesity, unspecified: Secondary | ICD-10-CM | POA: Diagnosis present

## 2017-12-24 DIAGNOSIS — F17210 Nicotine dependence, cigarettes, uncomplicated: Secondary | ICD-10-CM | POA: Diagnosis present

## 2017-12-24 DIAGNOSIS — Z8249 Family history of ischemic heart disease and other diseases of the circulatory system: Secondary | ICD-10-CM

## 2017-12-24 DIAGNOSIS — J9621 Acute and chronic respiratory failure with hypoxia: Secondary | ICD-10-CM | POA: Diagnosis not present

## 2017-12-24 DIAGNOSIS — E785 Hyperlipidemia, unspecified: Secondary | ICD-10-CM | POA: Diagnosis present

## 2017-12-24 DIAGNOSIS — Z7951 Long term (current) use of inhaled steroids: Secondary | ICD-10-CM

## 2017-12-24 DIAGNOSIS — I25719 Atherosclerosis of autologous vein coronary artery bypass graft(s) with unspecified angina pectoris: Secondary | ICD-10-CM | POA: Diagnosis present

## 2017-12-24 DIAGNOSIS — Z8611 Personal history of tuberculosis: Secondary | ICD-10-CM

## 2017-12-24 DIAGNOSIS — Z9889 Other specified postprocedural states: Secondary | ICD-10-CM

## 2017-12-24 DIAGNOSIS — G4733 Obstructive sleep apnea (adult) (pediatric): Secondary | ICD-10-CM | POA: Diagnosis present

## 2017-12-24 DIAGNOSIS — J918 Pleural effusion in other conditions classified elsewhere: Secondary | ICD-10-CM | POA: Diagnosis not present

## 2017-12-24 DIAGNOSIS — E1151 Type 2 diabetes mellitus with diabetic peripheral angiopathy without gangrene: Secondary | ICD-10-CM | POA: Diagnosis present

## 2017-12-24 DIAGNOSIS — Z79899 Other long term (current) drug therapy: Secondary | ICD-10-CM

## 2017-12-24 DIAGNOSIS — I25709 Atherosclerosis of coronary artery bypass graft(s), unspecified, with unspecified angina pectoris: Secondary | ICD-10-CM | POA: Diagnosis present

## 2017-12-24 DIAGNOSIS — Z888 Allergy status to other drugs, medicaments and biological substances status: Secondary | ICD-10-CM

## 2017-12-24 DIAGNOSIS — Z9981 Dependence on supplemental oxygen: Secondary | ICD-10-CM

## 2017-12-24 DIAGNOSIS — Z833 Family history of diabetes mellitus: Secondary | ICD-10-CM

## 2017-12-24 DIAGNOSIS — R0600 Dyspnea, unspecified: Secondary | ICD-10-CM

## 2017-12-24 DIAGNOSIS — Z7901 Long term (current) use of anticoagulants: Secondary | ICD-10-CM

## 2017-12-24 DIAGNOSIS — Z9114 Patient's other noncompliance with medication regimen: Secondary | ICD-10-CM

## 2017-12-24 DIAGNOSIS — I130 Hypertensive heart and chronic kidney disease with heart failure and stage 1 through stage 4 chronic kidney disease, or unspecified chronic kidney disease: Secondary | ICD-10-CM | POA: Diagnosis present

## 2017-12-24 DIAGNOSIS — K219 Gastro-esophageal reflux disease without esophagitis: Secondary | ICD-10-CM | POA: Diagnosis present

## 2017-12-24 DIAGNOSIS — I110 Hypertensive heart disease with heart failure: Secondary | ICD-10-CM | POA: Diagnosis present

## 2017-12-24 DIAGNOSIS — E1159 Type 2 diabetes mellitus with other circulatory complications: Secondary | ICD-10-CM | POA: Diagnosis present

## 2017-12-24 DIAGNOSIS — E1165 Type 2 diabetes mellitus with hyperglycemia: Secondary | ICD-10-CM | POA: Diagnosis present

## 2017-12-24 DIAGNOSIS — I4819 Other persistent atrial fibrillation: Secondary | ICD-10-CM | POA: Diagnosis present

## 2017-12-24 DIAGNOSIS — Z716 Tobacco abuse counseling: Secondary | ICD-10-CM

## 2017-12-24 DIAGNOSIS — Z923 Personal history of irradiation: Secondary | ICD-10-CM

## 2017-12-24 DIAGNOSIS — I872 Venous insufficiency (chronic) (peripheral): Secondary | ICD-10-CM | POA: Diagnosis present

## 2017-12-24 DIAGNOSIS — I351 Nonrheumatic aortic (valve) insufficiency: Secondary | ICD-10-CM | POA: Diagnosis present

## 2017-12-24 DIAGNOSIS — R0602 Shortness of breath: Secondary | ICD-10-CM | POA: Diagnosis not present

## 2017-12-24 DIAGNOSIS — C3490 Malignant neoplasm of unspecified part of unspecified bronchus or lung: Secondary | ICD-10-CM | POA: Diagnosis not present

## 2017-12-24 DIAGNOSIS — Z801 Family history of malignant neoplasm of trachea, bronchus and lung: Secondary | ICD-10-CM

## 2017-12-24 LAB — CBC WITH DIFFERENTIAL/PLATELET
Basophils Absolute: 0 10*3/uL (ref 0.0–0.1)
Basophils Relative: 0 %
EOS ABS: 0.1 10*3/uL (ref 0.0–0.7)
Eosinophils Relative: 2 %
HCT: 50.2 % (ref 39.0–52.0)
HEMOGLOBIN: 16.6 g/dL (ref 13.0–17.0)
LYMPHS ABS: 1.2 10*3/uL (ref 0.7–4.0)
LYMPHS PCT: 14 %
MCH: 31 pg (ref 26.0–34.0)
MCHC: 33.1 g/dL (ref 30.0–36.0)
MCV: 93.8 fL (ref 78.0–100.0)
Monocytes Absolute: 1 10*3/uL (ref 0.1–1.0)
Monocytes Relative: 12 %
NEUTROS PCT: 72 %
Neutro Abs: 6.1 10*3/uL (ref 1.7–7.7)
Platelets: 155 10*3/uL (ref 150–400)
RBC: 5.35 MIL/uL (ref 4.22–5.81)
RDW: 14.7 % (ref 11.5–15.5)
WBC: 8.5 10*3/uL (ref 4.0–10.5)

## 2017-12-24 LAB — COMPREHENSIVE METABOLIC PANEL
ALK PHOS: 57 U/L (ref 38–126)
ALT: 22 U/L (ref 17–63)
AST: 21 U/L (ref 15–41)
Albumin: 3.6 g/dL (ref 3.5–5.0)
Anion gap: 12 (ref 5–15)
BUN: 20 mg/dL (ref 6–20)
CO2: 26 mmol/L (ref 22–32)
CREATININE: 1.23 mg/dL (ref 0.61–1.24)
Calcium: 9.2 mg/dL (ref 8.9–10.3)
Chloride: 104 mmol/L (ref 101–111)
GFR calc non Af Amer: 59 mL/min — ABNORMAL LOW (ref >60)
Glucose, Bld: 137 mg/dL — ABNORMAL HIGH (ref 65–99)
Potassium: 4.3 mmol/L (ref 3.5–5.1)
SODIUM: 142 mmol/L (ref 135–145)
Total Bilirubin: 0.7 mg/dL (ref 0.3–1.2)
Total Protein: 7.8 g/dL (ref 6.5–8.1)

## 2017-12-24 LAB — BLOOD GAS, ARTERIAL
ACID-BASE EXCESS: 2.9 mmol/L — AB (ref 0.0–2.0)
Bicarbonate: 27.4 mmol/L (ref 20.0–28.0)
Drawn by: 257701
O2 Content: 5 L/min
O2 Saturation: 89.6 %
PCO2 ART: 43.3 mmHg (ref 32.0–48.0)
PH ART: 7.417 (ref 7.350–7.450)
Patient temperature: 98.6
pO2, Arterial: 59.6 mmHg — ABNORMAL LOW (ref 83.0–108.0)

## 2017-12-24 LAB — TROPONIN I

## 2017-12-24 LAB — BRAIN NATRIURETIC PEPTIDE: B Natriuretic Peptide: 246.2 pg/mL — ABNORMAL HIGH (ref 0.0–100.0)

## 2017-12-24 LAB — PROCALCITONIN: Procalcitonin: <0.1 ng/mL

## 2017-12-24 LAB — CBG MONITORING, ED: GLUCOSE-CAPILLARY: 180 mg/dL — AB (ref 65–99)

## 2017-12-24 MED ORDER — SENNOSIDES-DOCUSATE SODIUM 8.6-50 MG PO TABS: 1.0000 | ORAL_TABLET | Freq: Every evening | ORAL | Status: DC | PRN

## 2017-12-24 MED ORDER — IPRATROPIUM-ALBUTEROL 0.5-2.5 (3) MG/3ML IN SOLN
3.0000 mL | Freq: Four times a day (QID) | RESPIRATORY_TRACT | Status: DC
  Administered 2017-12-25 (×3): 3 mL via RESPIRATORY_TRACT
  Filled 2017-12-24 (×3): qty 3

## 2017-12-24 MED ORDER — ETANERCEPT 50 MG/ML ~~LOC~~ SOAJ: 50.0000 mg | SUBCUTANEOUS | Status: DC

## 2017-12-24 MED ORDER — SODIUM CHLORIDE 0.9 % IV SOLN
2.0000 g | INTRAVENOUS | Status: DC
  Administered 2017-12-25 – 2017-12-27 (×3): 2 g via INTRAVENOUS
  Filled 2017-12-24 (×3): qty 2

## 2017-12-24 MED ORDER — SODIUM CHLORIDE 0.9 % IV SOLN
100.0000 mg | Freq: Once | INTRAVENOUS | Status: AC
  Administered 2017-12-24: 100 mg via INTRAVENOUS
  Filled 2017-12-24: qty 100

## 2017-12-24 MED ORDER — AZITHROMYCIN 250 MG PO TABS
500.0000 mg | ORAL_TABLET | Freq: Every day | ORAL | Status: AC
  Administered 2017-12-24 – 2017-12-26 (×3): 500 mg via ORAL
  Filled 2017-12-24 (×3): qty 2

## 2017-12-24 MED ORDER — SODIUM CHLORIDE 0.9 % IV SOLN
2.0000 g | Freq: Once | INTRAVENOUS | Status: AC
  Administered 2017-12-24: 2 g via INTRAVENOUS
  Filled 2017-12-24: qty 20

## 2017-12-24 MED ORDER — METHYLPREDNISOLONE SODIUM SUCC 125 MG IJ SOLR
125.0000 mg | Freq: Once | INTRAMUSCULAR | Status: AC
  Administered 2017-12-24: 125 mg via INTRAVENOUS
  Filled 2017-12-24: qty 2

## 2017-12-24 MED ORDER — COLLAGENASE 250 UNIT/GM EX OINT: 1.0000 "application " | TOPICAL_OINTMENT | Freq: Every day | CUTANEOUS | Status: DC | PRN

## 2017-12-24 MED ORDER — ROSUVASTATIN CALCIUM 10 MG PO TABS
10.0000 mg | ORAL_TABLET | Freq: Every day | ORAL | Status: DC
  Administered 2017-12-25 – 2017-12-29 (×5): 10 mg via ORAL
  Filled 2017-12-24 (×5): qty 1

## 2017-12-24 MED ORDER — RIVAROXABAN 20 MG PO TABS
20.0000 mg | ORAL_TABLET | Freq: Every day | ORAL | Status: DC
  Administered 2017-12-25 – 2017-12-29 (×5): 20 mg via ORAL
  Filled 2017-12-24 (×5): qty 1

## 2017-12-24 MED ORDER — BUDESONIDE 0.5 MG/2ML IN SUSP
0.5000 mg | Freq: Two times a day (BID) | RESPIRATORY_TRACT | Status: DC
  Administered 2017-12-24 – 2017-12-29 (×10): 0.5 mg via RESPIRATORY_TRACT
  Filled 2017-12-24 (×10): qty 2

## 2017-12-24 MED ORDER — ARFORMOTEROL TARTRATE 15 MCG/2ML IN NEBU
15.0000 ug | INHALATION_SOLUTION | Freq: Two times a day (BID) | RESPIRATORY_TRACT | Status: DC
  Administered 2017-12-25 – 2017-12-29 (×8): 15 ug via RESPIRATORY_TRACT
  Filled 2017-12-24 (×12): qty 2

## 2017-12-24 MED ORDER — ROSUVASTATIN CALCIUM 10 MG PO TABS
10.0000 mg | ORAL_TABLET | Freq: Every day | ORAL | Status: DC
  Filled 2017-12-24: qty 1

## 2017-12-24 MED ORDER — LIRAGLUTIDE 18 MG/3ML ~~LOC~~ SOPN: 1.8000 mg | PEN_INJECTOR | Freq: Every day | SUBCUTANEOUS | Status: DC

## 2017-12-24 MED ORDER — LOSARTAN POTASSIUM 25 MG PO TABS
25.0000 mg | ORAL_TABLET | Freq: Every day | ORAL | Status: DC
Start: 2017-12-24 — End: 2017-12-29
  Administered 2017-12-25 – 2017-12-29 (×5): 25 mg via ORAL
  Filled 2017-12-24 (×5): qty 1

## 2017-12-24 MED ORDER — CLOBETASOL PROPIONATE 0.05 % EX FOAM: 1.0000 "application " | Freq: Two times a day (BID) | CUTANEOUS | Status: DC | PRN

## 2017-12-24 MED ORDER — ACETAMINOPHEN 325 MG PO TABS: 650.0000 mg | ORAL_TABLET | Freq: Four times a day (QID) | ORAL | Status: DC | PRN

## 2017-12-24 MED ORDER — NINTEDANIB ESYLATE 150 MG PO CAPS: 150.0000 mg | ORAL_CAPSULE | Freq: Two times a day (BID) | ORAL | Status: DC

## 2017-12-24 MED ORDER — ACETAMINOPHEN 650 MG RE SUPP: 650.0000 mg | Freq: Four times a day (QID) | RECTAL | Status: DC | PRN

## 2017-12-24 MED ORDER — FUROSEMIDE 10 MG/ML IJ SOLN
80.0000 mg | Freq: Two times a day (BID) | INTRAMUSCULAR | Status: DC
  Administered 2017-12-24 – 2017-12-29 (×10): 80 mg via INTRAVENOUS
  Filled 2017-12-24 (×10): qty 8

## 2017-12-24 MED ORDER — INSULIN ASPART 100 UNIT/ML ~~LOC~~ SOLN
0.0000 [IU] | Freq: Three times a day (TID) | SUBCUTANEOUS | Status: DC
  Administered 2017-12-25: 4 [IU] via SUBCUTANEOUS
  Administered 2017-12-25: 7 [IU] via SUBCUTANEOUS
  Administered 2017-12-25: 4 [IU] via SUBCUTANEOUS
  Administered 2017-12-26: 11 [IU] via SUBCUTANEOUS
  Administered 2017-12-26: 3 [IU] via SUBCUTANEOUS
  Administered 2017-12-26: 15 [IU] via SUBCUTANEOUS
  Administered 2017-12-27: 11 [IU] via SUBCUTANEOUS
  Administered 2017-12-27: 7 [IU] via SUBCUTANEOUS
  Administered 2017-12-27: 20 [IU] via SUBCUTANEOUS
  Administered 2017-12-28: 4 [IU] via SUBCUTANEOUS
  Administered 2017-12-28 (×2): 7 [IU] via SUBCUTANEOUS
  Administered 2017-12-29: 11 [IU] via SUBCUTANEOUS
  Administered 2017-12-29: 7 [IU] via SUBCUTANEOUS

## 2017-12-24 MED ORDER — IPRATROPIUM-ALBUTEROL 0.5-2.5 (3) MG/3ML IN SOLN
3.0000 mL | Freq: Once | RESPIRATORY_TRACT | Status: AC
  Administered 2017-12-24: 3 mL via RESPIRATORY_TRACT
  Filled 2017-12-24: qty 3

## 2017-12-24 MED ORDER — ONDANSETRON HCL 4 MG/2ML IJ SOLN: 4.0000 mg | Freq: Four times a day (QID) | INTRAMUSCULAR | Status: DC | PRN

## 2017-12-24 MED ORDER — IPRATROPIUM-ALBUTEROL 0.5-2.5 (3) MG/3ML IN SOLN
3.0000 mL | Freq: Four times a day (QID) | RESPIRATORY_TRACT | Status: DC
  Administered 2017-12-24: 3 mL via RESPIRATORY_TRACT
  Filled 2017-12-24: qty 3

## 2017-12-24 MED ORDER — METOPROLOL TARTRATE 50 MG PO TABS
150.0000 mg | ORAL_TABLET | Freq: Every day | ORAL | Status: DC
  Administered 2017-12-24 – 2017-12-28 (×5): 150 mg via ORAL
  Filled 2017-12-24 (×5): qty 3

## 2017-12-24 MED ORDER — IPRATROPIUM-ALBUTEROL 0.5-2.5 (3) MG/3ML IN SOLN: 3.0000 mL | RESPIRATORY_TRACT | Status: DC | PRN

## 2017-12-24 MED ORDER — METOPROLOL TARTRATE 50 MG PO TABS
100.0000 mg | ORAL_TABLET | Freq: Every day | ORAL | Status: DC
  Administered 2017-12-25 – 2017-12-29 (×5): 100 mg via ORAL
  Filled 2017-12-24 (×5): qty 2

## 2017-12-24 MED ORDER — DILTIAZEM HCL ER COATED BEADS 180 MG PO CP24
180.0000 mg | ORAL_CAPSULE | Freq: Two times a day (BID) | ORAL | Status: DC
  Administered 2017-12-24 – 2017-12-29 (×10): 180 mg via ORAL
  Filled 2017-12-24 (×10): qty 1

## 2017-12-24 MED ORDER — ONDANSETRON HCL 4 MG PO TABS: 4.0000 mg | ORAL_TABLET | Freq: Four times a day (QID) | ORAL | Status: DC | PRN

## 2017-12-24 MED ORDER — INSULIN GLARGINE 100 UNIT/ML ~~LOC~~ SOLN
30.0000 [IU] | Freq: Every day | SUBCUTANEOUS | Status: DC
  Administered 2017-12-24 – 2017-12-28 (×5): 30 [IU] via SUBCUTANEOUS
  Filled 2017-12-24 (×6): qty 0.3

## 2017-12-24 MED ORDER — METOLAZONE 5 MG PO TABS
5.0000 mg | ORAL_TABLET | Freq: Once | ORAL | Status: AC
  Administered 2017-12-24: 5 mg via ORAL
  Filled 2017-12-24 (×2): qty 1

## 2017-12-24 MED ORDER — METHYLPREDNISOLONE SODIUM SUCC 125 MG IJ SOLR
60.0000 mg | Freq: Two times a day (BID) | INTRAMUSCULAR | Status: DC
  Administered 2017-12-25 (×2): 60 mg via INTRAVENOUS
  Filled 2017-12-24 (×2): qty 2

## 2017-12-24 MED ORDER — POTASSIUM CHLORIDE CRYS ER 20 MEQ PO TBCR
20.0000 meq | EXTENDED_RELEASE_TABLET | Freq: Two times a day (BID) | ORAL | Status: DC
  Administered 2017-12-24 – 2017-12-29 (×10): 20 meq via ORAL
  Filled 2017-12-24 (×10): qty 1

## 2017-12-24 MED ORDER — TAMSULOSIN HCL 0.4 MG PO CAPS
0.4000 mg | ORAL_CAPSULE | Freq: Every day | ORAL | Status: DC
  Administered 2017-12-25 – 2017-12-29 (×5): 0.4 mg via ORAL
  Filled 2017-12-24 (×5): qty 1

## 2017-12-24 NOTE — ED Provider Notes (Addendum)
[facility] DEPT Provider Note   CSN: [id number] Arrival date & time: 12/24/17  1417     History   Chief Complaint Chief Complaint  Patient presents with  . Shortness of Breath  . Leg Swelling    HPI Neil Imran. is a 67 y.o. male.  HPI   67 year old male with past medical history as below including chronic hypoxic respiratory failure, lung disease, chronic heart failure, here with cough and shortness of breath.  Patient states that over the last week, he has had increased chest congestion as well as sensation of increased shortness of breath.  He is also noticed he has gained approximately 10 pounds, and had increased swelling of his legs as well as difficulty sleeping at night.  He called his PCP today for evaluation and was told to come to the ED.  He states he feels lightheaded with exertion, as well as extremely short of breath.  Denies any chest pain.  Denies any fevers.  No change in his sputum production.  He has noticed increased wheezing.  Denies any alleviating factors.  No known sick contacts.  He is been taking his Lasix as prescribed.  Past Medical History:  Diagnosis Date  . Adenomatous colon polyp   . Aortic insufficiency    Echo 3/18: Severe concentric LVH, EF 60-65, normal wall motion, moderate AI, mild LAE, mild TR // Echo 9/18: Mild concentric LVH, EF 60-65, normal wall motion, trivial AI, MAC, mild BAE  . Arthritis    left hip replacement  . Atrial flutter ([place])    onset  2011. s/p EPS/RFA 12/2011  . Cataract   . Chronic bronchitis   . Contact dermatitis and other eczema due to plants (except food)   . COPD (chronic obstructive pulmonary disease) ([place])   . GERD (gastroesophageal reflux disease)   . Hyperlipidemia   . Hypertension   . Lung cancer ([place])   . LV dysfunction    EF 45-50% 12/2011  . OSA (obstructive sleep apnea) 10/13/2016   Mild with AHI 9.7/hr with significant oxygen desaturations as low as 76% now on  CPAP  . Other peripheral vascular disease(443.89)    bilateral lower extremity  . Tobacco use disorder    dependent  . Transient global amnesia   . Tuberculosis   . Type II diabetes mellitus [facility])     Patient Active Problem List   Diagnosis Date Noted  . Syncope 05/25/2017  . 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 IPF (idiopathic pulmonary fibrosis) ([place]) 02/16/2017  . Chronic respiratory failure ([place]) 01/15/2017  . Aortic insufficiency   . OSA (obstructive sleep apnea) 10/13/2016  . Obesity (BMI 30-39.9) 10/13/2016  . Nocturnal hypoxemia 10/13/2016  . Lower back pain 09/17/2016  . Chronic anticoagulation-Xarelto (CHADs VASc=5) 02/27/2016  . Persistent atrial fibrillation ([place]) 02/26/2016  . Acute on chronic diastolic heart failure ([place]) 02/26/2016  . Coronary artery disease involving coronary bypass graft of native heart with angina pectoris ([place]) 11/10/2015  . Thrombocytopenia ([place]) 11/10/2015  . Pulmonary HTN ([place]) 11/10/2015  . Angina pectoris syndrome ([place]) 07/09/2015  . Left ventricular systolic dysfunction 02/72/5366  . Psoriasis 03/06/2010  . Type 2 diabetes mellitus with vascular disease ([place]) 12/30/2009  . Hypertensive heart disease with CHF (congestive heart failure) ([place]) 05/21/2008  . PVD (peripheral vascular disease) with claudication ([place]) 05/21/2008  . Hyperlipidemia 11/22/2007  . Smoker 11/22/2007  . Bronchitis, chronic obstructive, with exacerbation ([place]) 11/22/2007  Past Surgical History:  Procedure Laterality Date  . A FLUTTER ABLATION N/A 12/28/2011   Procedure: ABLATION A FLUTTER;  Surgeon: Evans Lance, MD;  Location[facility] CATH LAB;  Service: Cardiovascular;  Laterality: N/A;  . CARDIAC CATHETERIZATION N/A 07/10/2015   Procedure: Left Heart Cath and Coronary Angiography;  Surgeon: Sherren Mocha, MD;  Location: [facility] CV LAB;  Service: Cardiovascular;  Laterality: N/A;  . CARDIAC ELECTROPHYSIOLOGY MAPPING AND ABLATION   03/2010  . CARDIOVERSION N/A 07/13/2016   Procedure: CARDIOVERSION;  Surgeon: Skeet Latch, MD;  Location: [facility];  Service: Cardiovascular;  Laterality: N/A;  . COLONOSCOPY    . colonoscopy with polypectomy  2006 & 2011   Dr [name]  . CYSTOSCOPY  11/2007   Dr Jeffie Pollock  . PARTIAL HIP ARTHROPLASTY Right 01/2000   "Right; replaced ball & stem"  . PARTIAL HIP ARTHROPLASTY Left   . POLYPECTOMY    . TEE WITHOUT CARDIOVERSION  12/28/2011   Procedure: TRANSESOPHAGEAL ECHOCARDIOGRAM (TEE);  Surgeon: Larey Dresser, MD;  Location: [facility];  Service: Cardiovascular;  Laterality: N/A;        Home Medications    Prior to Admission medications   Medication Sig Start Date End Date Taking? Authorizing Provider  acetaminophen (TYLENOL) 650 MG CR tablet Take 1,300 mg by mouth every 8 (eight) hours as needed for pain.    Yes [provider]  diltiazem (CARTIA XT) 180 MG 24 hr capsule Take 1 capsule (180 mg total) by mouth 2 (two) times daily. 01/08/17  Yes Sherren Mocha, MD  Flaxseed, Linseed, (FLAX SEED OIL) 1000 MG CAPS Take 1 capsule by mouth 2 (two) times daily.    Yes [provider]  Fluticasone-Salmeterol (ADVAIR) 100-50 MCG/DOSE AEPB Inhale 1 puff into the lungs 2 (two) times daily.   Yes [provider]  Fluticasone-Umeclidin-Vilant (TRELEGY ELLIPTA) 100-62.5-25 MCG/INH AEPB Inhale 1 puff into the lungs daily. 10/14/17  Yes Hoyt Koch, MD  furosemide (LASIX) 80 MG tablet Take 80 mg by mouth 2 (two) times daily.   Yes [provider]  Insulin Glargine (LANTUS SOLOSTAR) 100 UNIT/ML Solostar Pen Inject 30 Units into the skin daily at 10 pm. 12/08/17  Yes Elayne Snare, MD  liraglutide (VICTOZA) 18 MG/3ML SOPN INJECT 0.3ML (=1.8MG)      SUBCUTANEOUSLY DAILY 07/26/17  Yes Elayne Snare, MD  losartan (COZAAR) 25 MG tablet TAKE 1 TABLET BY MOUTH ONCE DAILY 12/23/17  Yes Sherren Mocha, MD  metFORMIN (GLUCOPHAGE) 1000 MG tablet TAKE 1 TABLET BY MOUTH  TWICE DAILY WITH FOOD 12/20/17  Yes Elayne Snare, MD  metoprolol tartrate (LOPRESSOR) 100 MG tablet take1 tablet by mouth every morning and take 1 and 1/2 tablets by mouth every evening 08/03/17  Yes Sherren Mocha, MD  Multiple Vitamins-Minerals (CENTRUM ADULTS) TABS Take 1 tablet by mouth daily.   Yes [provider]  Nintedanib (OFEV) 150 MG CAPS Take 1 capsule (150 mg total) by mouth 2 (two) times daily. 10/29/17  Yes Rigoberto Noel, MD  Omega-3 Fatty Acids (FISH OIL) 1000 MG CAPS Take 1 capsule by mouth 2 (two) times daily.    Yes [provider]  potassium chloride SA (K-DUR,KLOR-CON) 20 MEQ tablet take 1 tablet by mouth twice a day 04/22/17  Yes Imogene Burn, PA-C  rivaroxaban (XARELTO) 20 MG TABS tablet Take 20 mg by mouth every morning.    Yes [provider]  rosuvastatin (CRESTOR) 10 MG tablet Take 1 tablet (10 mg total) by mouth daily. 05/22/17  Yes Nche, Charlene Brooke, NP  tamsulosin (FLOMAX) 0.4 MG CAPS capsule Take 0.4 mg by mouth daily.    Yes [provider]  clobetasol (OLUX) 0.05 % topical foam Apply 1 application topically 2 (two) times daily as needed (skin).  12/27/14   [provider]  collagenase (SANTYL) ointment Apply 1 application topically daily as needed (dermal ulcer).     [provider]  etanercept (ENBREL SURECLICK) 50 MG/ML injection Inject 50 mg into the skin as directed. Inject on Sundays & Wednesdays     [provider]  Insulin Detemir (LEVEMIR FLEXPEN) 100 UNIT/ML Pen Inject 28 Units daily at 10 pm into the skin. Patient not taking: Reported on 12/24/2017 07/26/17   Elayne Snare, MD  mupirocin ointment (BACTROBAN) 2 % Apply 1 application topically 2 (two) times daily as needed (skin).     [provider]  NOVOFINE 32G X 6 MM MISC use twice a day 03/22/17   Elayne Snare, MD  [facility] DELICA LANCETS FINE [place] TEST BLOOD SUGAR DAILY 03/22/17   Elayne Snare, MD  triamcinolone cream (KENALOG) 0.1 % Apply 1  application topically 2 (two) times daily as needed (affected area/skin).     [provider]    Family History Family History  Problem Relation Age of Onset  . Stroke Mother 71  . Hypertension Mother   . Diabetes Mother   . Diabetes Paternal Grandmother   . Lung cancer Father        smoker  . Diabetes Brother   . Heart attack Brother 69  . Diabetes Brother   . Hepatitis C Brother   . Throat cancer Paternal Uncle        1/2 uncle  . Heart attack Brother 23  . Crohn's disease Son   . Colon cancer Neg Hx   . Esophageal cancer Neg Hx   . Rectal cancer Neg Hx   . Stomach cancer Neg Hx     Social History Social History   Tobacco Use  . Smoking status: Current Every Day Smoker    Packs/day: 1.00    Years: 44.00    Pack years: 44.00    Types: Cigarettes  . Smokeless tobacco: Never Used  . Tobacco comment: started @ age 69, up to 2 ppd;1.25-1.5 ppd as of 11/23/13  Substance Use Topics  . Alcohol use: No    Alcohol/week: 0.0 oz    Comment:  11/23/13 "2-3 drinks per year"  . Drug use: No     Allergies   Niacin   Review of Systems Review of Systems  Constitutional: Positive for fatigue. Negative for chills and fever.  HENT: Negative for congestion and rhinorrhea.   Eyes: Negative for visual disturbance.  Respiratory: Positive for cough, shortness of breath and wheezing.   Cardiovascular: Positive for leg swelling. Negative for chest pain.  Gastrointestinal: Negative for abdominal pain, diarrhea, nausea and vomiting.  Genitourinary: Negative for dysuria and flank pain.  Musculoskeletal: Negative for neck pain and neck stiffness.  Skin: Negative for rash and wound.  Allergic/Immunologic: Negative for immunocompromised state.  Neurological: Positive for weakness. Negative for syncope and headaches.  All other systems reviewed and are negative.    Physical Exam Updated Vital Signs BP 102/64 (BP Location: Left Arm)   Pulse 95   Temp 98.2 F (36.8 C) (Oral)    Resp (!) 22   SpO2 92%   Physical Exam  Constitutional: He is oriented to person, place, and time. He appears well-developed and well-nourished.  No distress.  Chronically ill-appearing, appears older than stated age  HENT:  Head: Normocephalic and atraumatic.  Eyes: Conjunctivae are normal.  Neck: Neck supple.  Cardiovascular: Normal rate and normal heart sounds. An irregularly irregular rhythm present. Exam reveals no friction rub.  No murmur heard. Pulmonary/Chest: Effort normal. Tachypnea noted. No respiratory distress. He has decreased breath sounds. He has wheezes in the right upper field, the right middle field, the right lower field, the left upper field, the left middle field and the left lower field. He has rhonchi in the right middle field, the right lower field, the left middle field and the left lower field. He has no rales.  Abdominal: He exhibits no distension.  Musculoskeletal: He exhibits no edema.  Neurological: He is alert and oriented to person, place, and time. He exhibits normal muscle tone.  Skin: Skin is warm. Capillary refill takes less than 2 seconds.  Psychiatric: He has a normal mood and affect.  Nursing note and vitals reviewed.    ED Treatments / Results  Labs (all labs ordered are listed, but only abnormal results are displayed) Labs Reviewed  COMPREHENSIVE METABOLIC PANEL - Abnormal; Notable for the following components:      Result Value   Glucose, Bld 137 (*)    GFR calc non Af Amer 59 (*)    All other components within normal limits  BRAIN NATRIURETIC PEPTIDE - Abnormal; Notable for the following components:   B Natriuretic Peptide 246.2 (*)    All other components within normal limits  BLOOD GAS, ARTERIAL - Abnormal; Notable for the following components:   pO2, Arterial 59.6 (*)    Acid-Base Excess 2.9 (*)    All other components within normal limits  CULTURE, BLOOD (ROUTINE X 2)  CULTURE, BLOOD (ROUTINE X 2)  CBC WITH DIFFERENTIAL/PLATELET   TROPONIN I    EKG EKG Interpretation  Date/Time:  Friday December 24 2017 14:34:40 EDT Ventricular Rate:  87 PR Interval:    QRS Duration: 99 QT Interval:  368 QTC Calculation: 443 R Axis:   90 Text Interpretation:  Atrial fibrillation Borderline right axis deviation Since last EKG, afib appears new Confirmed by Duffy Bruce [phone]) on 12/24/2017 3:03:49 PM   Radiology Dg Chest 2 View  Result Date: 12/24/2017 CLINICAL DATA:  Shortness of breath and lower extremity swelling for 3 weeks. EXAM: CHEST - 2 VIEW COMPARISON:  10/15/2017 FINDINGS: Interval progression of pleuroparenchymal opacity in the right lateral mid hemithorax. Diffuse underlying interstitial lung disease again noted. Cardiopericardial silhouette is at upper limits of normal for size. The visualized bony structures of the thorax are intact. Telemetry leads overlie the chest. IMPRESSION: Interval progression of lateral pleuroparenchymal opacity in the right mid lung. CT chest may be warranted to further evaluate. Underlying changes of chronic interstitial lung disease. Electronically Signed   By: Misty Stanley M.D.   On: 12/24/2017 15:08    Procedures .Critical Care Performed by: Duffy Bruce, MD Authorized by: Duffy Bruce, MD   Critical care provider statement:    Critical care time (minutes):  35   Critical care start time:  01/10/2018 3:28 PM   Critical care time was exclusive of:  Separately billable procedures and treating other patients and teaching time   Critical care was necessary to treat or prevent imminent or life-threatening deterioration of the following conditions:  Cardiac failure, circulatory failure, sepsis and respiratory failure   Critical care was time spent personally by me on the following activities:  Development of treatment  plan with patient or surrogate, discussions with consultants, evaluation of patient's response to treatment, examination of patient, obtaining history from patient or  surrogate, ordering and performing treatments and interventions, ordering and review of laboratory studies, ordering and review of radiographic studies, pulse oximetry, re-evaluation of patient's condition and review of old charts   I assumed direction of critical care for this patient from another provider in my specialty: no     (including critical care time)  Medications Ordered in ED Medications  doxycycline (VIBRAMYCIN) 100 mg in sodium chloride 0.9 % 250 mL IVPB (100 mg Intravenous New Bag/Given 12/24/17 1655)  methylPREDNISolone sodium succinate (SOLU-MEDROL) 125 mg/2 mL injection 125 mg (125 mg Intravenous Given 12/24/17 1544)  ipratropium-albuterol (DUONEB) 0.5-2.5 (3) MG/3ML nebulizer solution 3 mL (3 mLs Nebulization Given 12/24/17 1553)  cefTRIAXone (ROCEPHIN) 2 g in sodium chloride 0.9 % 100 mL IVPB (2 g Intravenous New Bag/Given 12/24/17 1655)     Initial Impression / Assessment and Plan / ED Course  I have reviewed the triage vital signs and the nursing notes.  Pertinent labs & imaging results that were available during my care of the patient were reviewed by me and considered in my medical decision making (see chart for details).  Clinical Course as of Dec 24 1725  Fri Dec 24, 2017  1523 Presentation concerning for acute on chronic, likely multifactorial hypoxic resp failure. Suspect large component of CHF/hypervolemia, but given reported congestion, sputum production, concern for infectious exacerbation of his ILD as well. EKG non-ischemic. Will give steroids and nebs for his wheezing/ILD component, f/u BNP.   [CI]  1524 Possible PNA. Will f/u remainder of labs, BCx sent. Likely needs empiric tx, though his severe underlying lung disease makes interpretation somewhat more difficult.  DG Chest 2 View [CI]  8891 Brain natriuretic peptide(!) [CI]  1659 Lab work overall reassuring.  Chest x-ray with right-sided opacification.  Patient does endorse some infectious symptoms.  Lab work is  otherwise unremarkable.  Suspect component of COPD with possible pneumonia.  Will start on broad-spectrum antibiotics, steroids, and admit.   [CI]    Clinical Course User Index [CI] Duffy Bruce, MD    Final Clinical Impressions(s) / ED Diagnoses   Final diagnoses:  Acute on chronic respiratory failure with hypoxia [facility])  Community acquired pneumonia of right middle lobe of lung [facility])    ED Discharge Orders    None       Duffy Bruce, MD 12/24/17 1727    Duffy Bruce, MD 01/10/18 1529

## 2017-12-24 NOTE — ED Notes (Signed)
ED TO INPATIENT HANDOFF REPORT  Name/Age/Gender Neil Housekeeper Sr. 67 y.o. male  Code Status    Code Status Orders  (From admission, onward)        Start     Ordered   12/24/17 1759  Full code  Continuous     12/24/17 1805    Code Status History    Date Active Date Inactive Code Status Order ID Comments User Context   02/25/2017 1157 02/27/2017 1951 Full Code [id number]  Rush Landmark Inpatient   02/25/2016 1915 02/29/2016 1608 Full Code [id number]  Bonnell Public, MD Inpatient   02/25/2016 1915 02/25/2016 1915 Full Code [id number]  Bonnell Public, MD Inpatient   11/10/2015 1638 11/12/2015 1510 Full Code [id number]  Samella Parr, NP Inpatient   07/10/2015 1259 07/10/2015 1904 Full Code [id number]  Sherren Mocha, MD Inpatient      Home/SNF/Other Home  Chief Complaint shortness of breath; bilateral leg swelling  Level of Care/Admitting Diagnosis ED Disposition    ED Disposition Condition [place]: [facility] [[id number]]  Level of Care: Med-Surg [16]  Diagnosis: Acute on chronic respiratory failure [facility]) [[id number]]  Admitting Physician: Patrecia Pour, EDWIN [[id number]]  Attending Physician: Patrecia Pour, EDWIN [[id number]]  PT Class (Do Not Modify): Observation [104]  PT Acc Code (Do Not Modify): Observation [10022]       Medical History Past Medical History:  Diagnosis Date  . Adenomatous colon polyp   . Aortic insufficiency    Echo 3/18: Severe concentric LVH, EF 60-65, normal wall motion, moderate AI, mild LAE, mild TR // Echo 9/18: Mild concentric LVH, EF 60-65, normal wall motion, trivial AI, MAC, mild BAE  . Arthritis    left hip replacement  . Atrial flutter ([place])    onset  2011. s/p EPS/RFA 12/2011  . Cataract   . Chronic bronchitis   . Contact dermatitis and other eczema due to plants (except food)   . COPD (chronic obstructive pulmonary disease) ([place])   . GERD (gastroesophageal reflux disease)   .  Hyperlipidemia   . Hypertension   . Lung cancer ([place])   . LV dysfunction    EF 45-50% 12/2011  . OSA (obstructive sleep apnea) 10/13/2016   Mild with AHI 9.7/hr with significant oxygen desaturations as low as 76% now on CPAP  . Other peripheral vascular disease(443.89)    bilateral lower extremity  . Tobacco use disorder    dependent  . Transient global amnesia   . Tuberculosis   . Type II diabetes mellitus (H[facility])     Allergies Allergies  Allergen Reactions  . Niacin Nausea Only    REACTION: upset stomach    IV Location/Drains/Wounds Patient Lines/Drains/Airways Status   Active Line/Drains/Airways    Name:   Placement date:   Placement time:   Site:   Days:   Peripheral IV 12/24/17 Left Forearm   12/24/17    1542    Forearm   less than 1   Peripheral IV 12/24/17 Right Forearm   12/24/17    1601    Forearm   less than 1   Chest Tube 1 Right Pleural 14 Fr.   02/25/17    0910    Pleural   302   Closed System Drain   -    -    -      Wound / Incision (Open or Dehisced) 02/25/16 Other (Comment) Leg Right;Left;Bilateral blister open and fluilled filled unopened blisters,  weeping   02/25/16    1621    Leg   668          Labs/Imaging Results for orders placed or performed during the hospital encounter of 12/24/17 (from the past 48 hour(s))  Blood gas, arterial ([facility] & [facility] ONLY)     Status: Abnormal   Collection Time: 12/24/17  3:45 PM  Result Value Ref Range   O2 Content 5.0 L/min   Delivery systems NASAL CANNULA    pH, Arterial 7.417 7.350 - 7.450   pCO2 arterial 43.3 32.0 - 48.0 mmHg   pO2, Arterial 59.6 (L) 83.0 - 108.0 mmHg   Bicarbonate 27.4 20.0 - 28.0 mmol/L   Acid-Base Excess 2.9 (H) 0.0 - 2.0 mmol/L   O2 Saturation 89.6 %   Patient temperature 98.6    Collection site RIGHT RADIAL    Drawn by [id number]    Sample type ARTERIAL DRAW    Allens test (pass/fail) PASS PASS    Comment: Performed at [facility], [place] [address] [place], [place] [address]   CBC with Differential     Status: None   Collection Time: 12/24/17  3:46 PM  Result Value Ref Range   WBC 8.5 4.0 - 10.5 K/uL   RBC 5.35 4.22 - 5.81 MIL/uL   Hemoglobin 16.6 13.0 - 17.0 g/dL   HCT 50.2 39.0 - 52.0 %   MCV 93.8 78.0 - 100.0 fL   MCH 31.0 26.0 - 34.0 pg   MCHC 33.1 30.0 - 36.0 g/dL   RDW 14.7 11.5 - 15.5 %   Platelets 155 150 - 400 K/uL   Neutrophils Relative % 72 %   Neutro Abs 6.1 1.7 - 7.7 K/uL   Lymphocytes Relative 14 %   Lymphs Abs 1.2 0.7 - 4.0 K/uL   Monocytes Relative 12 %   Monocytes Absolute 1.0 0.1 - 1.0 K/uL   Eosinophils Relative 2 %   Eosinophils Absolute 0.1 0.0 - 0.7 K/uL   Basophils Relative 0 %   Basophils Absolute 0.0 0.0 - 0.1 K/uL    Comment: Performed at [facility], [place] [address], [place], [place] [address]  Comprehensive metabolic panel     Status: Abnormal   Collection Time: 12/24/17  3:46 PM  Result Value Ref Range   Sodium 142 135 - 145 mmol/L   Potassium 4.3 3.5 - 5.1 mmol/L   Chloride 104 101 - 111 mmol/L   CO2 26 22 - 32 mmol/L   Glucose, Bld 137 (H) 65 - 99 mg/dL   BUN 20 6 - 20 mg/dL   Creatinine, Ser 1.23 0.61 - 1.24 mg/dL   Calcium 9.2 8.9 - 10.3 mg/dL   Total Protein 7.8 6.5 - 8.1 g/dL   Albumin 3.6 3.5 - 5.0 g/dL   AST 21 15 - 41 U/L   ALT 22 17 - 63 U/L   Alkaline Phosphatase 57 38 - 126 U/L   Total Bilirubin 0.7 0.3 - 1.2 mg/dL   GFR calc non Af Amer 59 (L) >60 mL/min   GFR calc Af Amer >60 >60 mL/min    Comment: (NOTE) The eGFR has been calculated using the CKD EPI equation. This calculation has not been validated in all clinical situations. eGFR's persistently <60 mL/min signify possible Chronic Kidney Disease.    Anion gap 12 5 - 15    Comment: Performed at [facility], [place] [address], [place], [place] [address]  Brain natriuretic peptide  Status: Abnormal   Collection Time: 12/24/17  3:46 PM  Result Value Ref Range   B Natriuretic Peptide 246.2 (H) 0.0 - 100.0  pg/mL    Comment: Performed at [facility], [place] [address], [place], [place] [address]  Troponin I     Status: None   Collection Time: 12/24/17  3:46 PM  Result Value Ref Range   Troponin I <0.03 <0.03 ng/mL    Comment: Performed at [facility], [place] [address] [place],  [address]   Dg Chest 2 View  Result Date: 12/24/2017 CLINICAL DATA:  Shortness of breath and lower extremity swelling for 3 weeks. EXAM: CHEST - 2 VIEW COMPARISON:  10/15/2017 FINDINGS: Interval progression of pleuroparenchymal opacity in the right lateral mid hemithorax. Diffuse underlying interstitial lung disease again noted. Cardiopericardial silhouette is at upper limits of normal for size. The visualized bony structures of the thorax are intact. Telemetry leads overlie the chest. IMPRESSION: Interval progression of lateral pleuroparenchymal opacity in the right mid lung. CT chest may be warranted to further evaluate. Underlying changes of chronic interstitial lung disease. Electronically Signed   By: Misty Stanley M.D.   On: 12/24/2017 15:08    Pending Labs Unresulted Labs (From admission, onward)   Start     Ordered   12/25/17 0500  Comprehensive metabolic panel  Tomorrow morning,   R     12/24/17 1805   12/25/17 0500  CBC  Tomorrow morning,   R     12/24/17 1805   12/24/17 1748  Procalcitonin - Baseline  STAT,   STAT     12/24/17 1747   12/24/17 1522  Blood culture (routine x 2)  BLOOD CULTURE X 2,   STAT     12/24/17 1521      Vitals/Pain Today's Vitals   12/24/17 1508 12/24/17 1600 12/24/17 1800 12/24/17 1830  BP: 102/64 113/73 115/67 104/60  Pulse: 95     Resp: (!) 22 20 (!) 28 (!) 21  Temp: 98.2 F (36.8 C)     TempSrc: Oral     SpO2: 92% 93% 93%     Isolation Precautions No active isolations  Medications Medications  doxycycline (VIBRAMYCIN) 100 mg in sodium chloride 0.9 % 250 mL IVPB (100 mg Intravenous New Bag/Given 12/24/17 1655)  clobetasol  (OLUX) 4.82 % foam 1 application (has no administration in time range)  collagenase (SANTYL) ointment 1 application (has no administration in time range)  diltiazem (CARDIZEM CD) 24 hr capsule 180 mg (has no administration in time range)  etanercept (ENBREL) 50 MG/ML injection 50 mg (has no administration in time range)  Insulin Glargine (LANTUS) Solostar Pen 30 Units (has no administration in time range)  liraglutide (VICTOZA) SOPN 1.8 mg (has no administration in time range)  losartan (COZAAR) tablet 25 mg (has no administration in time range)  metoprolol tartrate (LOPRESSOR) tablet 150 mg (has no administration in time range)  metoprolol tartrate (LOPRESSOR) tablet 100 mg (has no administration in time range)  Nintedanib (OFEV) CAPS 150 mg (has no administration in time range)  potassium chloride SA (K-DUR,KLOR-CON) CR tablet 20 mEq (has no administration in time range)  rivaroxaban (XARELTO) tablet 20 mg (has no administration in time range)  rosuvastatin (CRESTOR) tablet 10 mg (has no administration in time range)  tamsulosin (FLOMAX) capsule 0.4 mg (has no administration in time range)  acetaminophen (TYLENOL) tablet 650 mg (has no administration in time range)    Or  acetaminophen (TYLENOL) suppository 650 mg (  has no administration in time range)  senna-docusate (Senokot-S) tablet 1 tablet (has no administration in time range)  ondansetron (ZOFRAN) tablet 4 mg (has no administration in time range)    Or  ondansetron (ZOFRAN) injection 4 mg (has no administration in time range)  arformoterol (BROVANA) nebulizer solution 15 mcg (has no administration in time range)  budesonide (PULMICORT) nebulizer solution 0.5 mg (has no administration in time range)  methylPREDNISolone sodium succinate (SOLU-MEDROL) 125 mg/2 mL injection 60 mg (has no administration in time range)  ipratropium-albuterol (DUONEB) 0.5-2.5 (3) MG/3ML nebulizer solution 3 mL (has no administration in time range)   furosemide (LASIX) injection 80 mg (has no administration in time range)  azithromycin (ZITHROMAX) tablet 500 mg (has no administration in time range)  cefTRIAXone (ROCEPHIN) 2 g in sodium chloride 0.9 % 100 mL IVPB (has no administration in time range)  metolazone (ZAROXOLYN) tablet 5 mg (has no administration in time range)  insulin aspart (novoLOG) injection 0-20 Units (has no administration in time range)  methylPREDNISolone sodium succinate (SOLU-MEDROL) 125 mg/2 mL injection 125 mg (125 mg Intravenous Given 12/24/17 1544)  ipratropium-albuterol (DUONEB) 0.5-2.5 (3) MG/3ML nebulizer solution 3 mL (3 mLs Nebulization Given 12/24/17 1553)  cefTRIAXone (ROCEPHIN) 2 g in sodium chloride 0.9 % 100 mL IVPB (0 g Intravenous Stopped 12/24/17 1749)    Mobility walks

## 2017-12-24 NOTE — H&P (Addendum)
History and Physical    Neil Carroll WNU:[id number] DOB: [birth year]  DOA: 12/24/2017 PCP: Hoyt Koch, MD  Patient coming from: Home  Chief Complaint: Leg swelling and shortness of breath  HPI: Neil Carroll. is a 67 y.o. male with medical history significant of chronic diastolic heart failure, chronic respiratory failure with ILD and COPD oxygen dependent at 4L, CAD, chronic A. fib psoriasis and venous insufficiency.  Heavy smoker.  Patient presented to the emergency department complaining of worsening shortness of breath and lower extremity edema this has been going on for about 4 weeks prior to admission being treated by pulmonary with increasing Lasix and prednisone taper but patient did not feel significantly improved.  Over the past 2-3 days patient has noticed increasing cough and sputum production.  His oxygen requirement has increased daily up to 5 L and oxygen saturation around low 80s.  Associated symptoms include chest discomfort, unable to sleep flat and lightheadedness and weakness.  Patient also reports gaining 10 pounds recently.  Patient denies any sick contact, no alleviating factors and no recent travel.  ED Course: Oxygen saturation found to be 83 on 4 L nasal cannula, chest x-ray reveal interval progression of lateral pleural parenchymal opacity in the right mid lung and interstitial lung disease.  Concern for pneumonia.  BNP 246, ABG PO2 59, otherwise labs unremarkable.  Patient was given nebulizer treatment and Solu-Medrol with improvement of symptoms.  [facility] called to admit for further management.  Review of Systems:   10 points ROS discussed only positive symptoms listed on HPI.   Past Medical History:  Diagnosis Date  . Adenomatous colon polyp   . Aortic insufficiency    Echo 3/18: Severe concentric LVH, EF 60-65, normal wall motion, moderate AI, mild LAE, mild TR // Echo 9/18: Mild concentric LVH, EF 60-65, normal wall motion, trivial  AI, MAC, mild BAE  . Arthritis    left hip replacement  . Atrial flutter ([place])    onset  2011. s/p EPS/RFA 12/2011  . Cataract   . Chronic bronchitis   . Contact dermatitis and other eczema due to plants (except food)   . COPD (chronic obstructive pulmonary disease) ([place])   . GERD (gastroesophageal reflux disease)   . Hyperlipidemia   . Hypertension   . Lung cancer ([place])   . LV dysfunction    EF 45-50% 12/2011  . OSA (obstructive sleep apnea) 10/13/2016   Mild with AHI 9.7/hr with significant oxygen desaturations as low as 76% now on CPAP  . Other peripheral vascular disease(443.89)    bilateral lower extremity  . Tobacco use disorder    dependent  . Transient global amnesia   . Tuberculosis   . Type II diabetes mellitus ([place])     Past Surgical History:  Procedure Laterality Date  . A FLUTTER ABLATION N/A 12/28/2011   Procedure: ABLATION A FLUTTER;  Surgeon: Evans Lance, MD;  Location: [facility] CATH LAB;  Service: Cardiovascular;  Laterality: N/A;  . CARDIAC CATHETERIZATION N/A 07/10/2015   Procedure: Left Heart Cath and Coronary Angiography;  Surgeon: Sherren Mocha, MD;  Location: [facility] CV LAB;  Service: Cardiovascular;  Laterality: N/A;  . CARDIAC ELECTROPHYSIOLOGY MAPPING AND ABLATION  03/2010  . CARDIOVERSION N/A 07/13/2016   Procedure: CARDIOVERSION;  Surgeon: Skeet Latch, MD;  Location: [facility];  Service: Cardiovascular;  Laterality: N/A;  . COLONOSCOPY    . colonoscopy with polypectomy  2006 & 2011   Dr [name]  . CYSTOSCOPY  11/2007   Dr Jeffie Pollock  . PARTIAL HIP ARTHROPLASTY Right 01/2000   "Right; replaced ball & stem"  . PARTIAL HIP ARTHROPLASTY Left   . POLYPECTOMY    . TEE WITHOUT CARDIOVERSION  12/28/2011   Procedure: TRANSESOPHAGEAL ECHOCARDIOGRAM (TEE);  Surgeon: Larey Dresser, MD;  Location: [facility] ENDOSCOPY;  Service: Cardiovascular;  Laterality: N/A;     reports that he has been smoking cigarettes.  He has a 44.00 pack-year smoking history. He has never  used smokeless tobacco. He reports that he does not drink alcohol or use drugs.  Allergies  Allergen Reactions  . Niacin Nausea Only    REACTION: upset stomach    Family History  Problem Relation Age of Onset  . Stroke Mother 39  . Hypertension Mother   . Diabetes Mother   . Diabetes Paternal Grandmother   . Lung cancer Father        smoker  . Diabetes Brother   . Heart attack Brother 82  . Diabetes Brother   . Hepatitis C Brother   . Throat cancer Paternal Uncle        1/2 uncle  . Heart attack Brother 51  . Crohn's disease Son   . Colon cancer Neg Hx   . Esophageal cancer Neg Hx   . Rectal cancer Neg Hx   . Stomach cancer Neg Hx     Prior to Admission medications   Medication Sig Start Date End Date Taking? Authorizing Provider  acetaminophen (TYLENOL) 650 MG CR tablet Take 1,300 mg by mouth every 8 (eight) hours as needed for pain.    Yes [provider]  diltiazem (CARTIA XT) 180 MG 24 hr capsule Take 1 capsule (180 mg total) by mouth 2 (two) times daily. 01/08/17  Yes Sherren Mocha, MD  Flaxseed, Linseed, (FLAX SEED OIL) 1000 MG CAPS Take 1 capsule by mouth 2 (two) times daily.    Yes [provider]  Fluticasone-Salmeterol (ADVAIR) 100-50 MCG/DOSE AEPB Inhale 1 puff into the lungs 2 (two) times daily.   Yes [provider]  Fluticasone-Umeclidin-Vilant (TRELEGY ELLIPTA) 100-62.5-25 MCG/INH AEPB Inhale 1 puff into the lungs daily. 10/14/17  Yes Hoyt Koch, MD  furosemide (LASIX) 80 MG tablet Take 80 mg by mouth 2 (two) times daily.   Yes [provider]  Insulin Glargine (LANTUS SOLOSTAR) 100 UNIT/ML Solostar Pen Inject 30 Units into the skin daily at 10 pm. 12/08/17  Yes Elayne Snare, MD  liraglutide (VICTOZA) 18 MG/3ML SOPN INJECT 0.3ML (=1.8MG)      SUBCUTANEOUSLY DAILY 07/26/17  Yes Elayne Snare, MD  losartan (COZAAR) 25 MG tablet TAKE 1 TABLET BY MOUTH ONCE DAILY 12/23/17  Yes Sherren Mocha, MD  metFORMIN (GLUCOPHAGE) 1000  MG tablet TAKE 1 TABLET BY MOUTH TWICE DAILY WITH FOOD 12/20/17  Yes Elayne Snare, MD  metoprolol tartrate (LOPRESSOR) 100 MG tablet take1 tablet by mouth every morning and take 1 and 1/2 tablets by mouth every evening 08/03/17  Yes Sherren Mocha, MD  Multiple Vitamins-Minerals (CENTRUM ADULTS) TABS Take 1 tablet by mouth daily.   Yes [provider]  Nintedanib (OFEV) 150 MG CAPS Take 1 capsule (150 mg total) by mouth 2 (two) times daily. 10/29/17  Yes Rigoberto Noel, MD  Omega-3 Fatty Acids (FISH OIL) 1000 MG CAPS Take 1 capsule by mouth 2 (two) times daily.    Yes [provider]  potassium chloride SA (K-DUR,KLOR-CON) 20 MEQ tablet take 1 tablet by mouth twice a day 04/22/17  Yes  Imogene Burn, PA-C  rivaroxaban (XARELTO) 20 MG TABS tablet Take 20 mg by mouth every morning.    Yes [provider]  rosuvastatin (CRESTOR) 10 MG tablet Take 1 tablet (10 mg total) by mouth daily. 05/22/17  Yes Nche, Charlene Brooke, NP  tamsulosin (FLOMAX) 0.4 MG CAPS capsule Take 0.4 mg by mouth daily.    Yes [provider]  clobetasol (OLUX) 0.05 % topical foam Apply 1 application topically 2 (two) times daily as needed (skin).  12/27/14   [provider]  collagenase (SANTYL) ointment Apply 1 application topically daily as needed (dermal ulcer).     [provider]  etanercept (ENBREL SURECLICK) 50 MG/ML injection Inject 50 mg into the skin as directed. Inject on Sundays & Wednesdays     [provider]  Insulin Detemir (LEVEMIR FLEXPEN) 100 UNIT/ML Pen Inject 28 Units daily at 10 pm into the skin. Patient not taking: Reported on 12/24/2017 07/26/17   Elayne Snare, MD  mupirocin ointment (BACTROBAN) 2 % Apply 1 application topically 2 (two) times daily as needed (skin).     [provider]  NOVOFINE 32G X 6 MM MISC use twice a day 03/22/17   Elayne Snare, MD  [facility] DELICA LANCETS FINE [place] TEST BLOOD SUGAR DAILY 03/22/17   Elayne Snare, MD  triamcinolone  cream (KENALOG) 0.1 % Apply 1 application topically 2 (two) times daily as needed (affected area/skin).     [provider]    Physical Exam: Vitals:   12/24/17 1436 12/24/17 1507 12/24/17 1508  BP: 115/68 102/64 102/64  Pulse: 94  95  Resp: 14 17 (!) 22  Temp: 98 F (36.7 C)  98.2 F (36.8 C)  TempSrc: Oral  Oral  SpO2: (!) 83% (!) 89% 92%     Constitutional: NAD, calm, comfortable Eyes: PERRL, lids and conjunctivae normal ENMT: Mucous membranes are moist. Posterior pharynx clear of any exudate or lesions. Neck: normal, supple, no thyromegaly, no JVD noted due to body habitus Respiratory: Normal effort, decreased breath sounds bilateral, diffuse expiratory wheezing, right lower and middle field faint rales, left lower base mild rhonchi. Cardiovascular: S1-S2 irregularly regular no murmurs, 2+ pedal pulses, 2+ LE pitting edema  Abdomen: no tenderness, no masses palpated. No hepatosplenomegaly. Bowel sounds positive.  Musculoskeletal: no clubbing / cyanosis. No joint deformity upper and lower extremities.  Skin: Dark discoloration in bilateral lower extremity consistent with venous insufficiency Neurologic: CN 2-12 grossly intact. Sensation intact  Psychiatric: Normal judgment and insight. Alert and oriented x 3. Normal mood.   Labs on Admission: I have personally reviewed following labs and imaging studies  CBC: Recent Labs  Lab 12/24/17 1546  WBC 8.5  NEUTROABS 6.1  HGB 16.6  HCT 50.2  MCV 93.8  PLT 161   Basic Metabolic Panel: Recent Labs  Lab 12/24/17 1546  NA 142  K 4.3  CL 104  CO2 26  GLUCOSE 137*  BUN 20  CREATININE 1.23  CALCIUM 9.2   GFR: CrCl cannot be calculated (Unknown ideal weight.). Liver Function Tests: Recent Labs  Lab 12/24/17 1546  AST 21  ALT 22  ALKPHOS 57  BILITOT 0.7  PROT 7.8  ALBUMIN 3.6   No results for input(s): LIPASE, AMYLASE in the last 168 hours. No results for input(s): AMMONIA in the last 168  hours. Coagulation Profile: No results for input(s): INR, PROTIME in the last 168 hours. Cardiac Enzymes: Recent Labs  Lab 12/24/17 1546  TROPONINI <0.03   BNP (last  3 results) Recent Labs    10/13/17 1202 10/21/17 1203  PROBNP 992* 962*   HbA1C: No results for input(s): HGBA1C in the last 72 hours. CBG: No results for input(s): GLUCAP in the last 168 hours. Lipid Profile: No results for input(s): CHOL, HDL, LDLCALC, TRIG, CHOLHDL, LDLDIRECT in the last 72 hours. Thyroid Function Tests: No results for input(s): TSH, T4TOTAL, FREET4, T3FREE, THYROIDAB in the last 72 hours. Anemia Panel: No results for input(s): VITAMINB12, FOLATE, FERRITIN, TIBC, IRON, RETICCTPCT in the last 72 hours. Urine analysis:    Component Value Date/Time   COLORURINE YELLOW 05/25/2017 [place] 05/25/2017 1158   LABSPEC 1.010 05/25/2017 1158   PHURINE 6.0 05/25/2017 1158   GLUCOSEU NEGATIVE 05/25/2017 1158   HGBUR NEGATIVE 05/25/2017 1158   BILIRUBINUR NEGATIVE 05/25/2017 1158   KETONESUR NEGATIVE 05/25/2017 1158   UROBILINOGEN 0.2 05/25/2017 1158   NITRITE NEGATIVE 05/25/2017 1158   LEUKOCYTESUR NEGATIVE 05/25/2017 1158   Sepsis Labs: !!!!!!!!!!!!!!!!!!!!!!!!!!!!!!!!!!!!!!!!!!!! _0 (procalcitonin:4,lacticidven:4) )No results found for this or any previous visit (from the past 240 hour(s)).   Radiological Exams on Admission: Dg Chest 2 View  Result Date: 12/24/2017 CLINICAL DATA:  Shortness of breath and lower extremity swelling for 3 weeks. EXAM: CHEST - 2 VIEW COMPARISON:  10/15/2017 FINDINGS: Interval progression of pleuroparenchymal opacity in the right lateral mid hemithorax. Diffuse underlying interstitial lung disease again noted. Cardiopericardial silhouette is at upper limits of normal for size. The visualized bony structures of the thorax are intact. Telemetry leads overlie the chest. IMPRESSION: Interval progression of lateral pleuroparenchymal opacity in the  right mid lung. CT chest may be warranted to further evaluate. Underlying changes of chronic interstitial lung disease. Electronically Signed   By: Misty [facility] M.D.   On: 12/24/2017 15:08    EKG: Independently reviewed.  A. fib rate controlled  Assessment/Plan: Acute on chronic respiratory failure Felt to be multifactorial due to chronic diastolic heart failure, ?  Pneumonia, non-small cell lung cancer/squamous cell carcinoma and severe chronic lung disease.  Chest x-ray with opacity in right middle lung and increasing sputum. Components of him not improving include noncompliance with medications and actively walking.  Patient have some improvement after Solu-Medrol and nebulizers in the ED.  Will place in observation.  Will start Brovana and Pulmicort twice daily.  DuoNeb every 6 hours schedule.  Solu-Medrol 60 mg twice daily.  Will change Lasix to IV formulation 80 mg twice daily and will add metolazone 5 mg x1.  Will obtain CT chest to further identify chest x-ray findings and assess interstitial lung disease.  Check pro-calcitonin.  Blood cultures were sent, obtain sputum culture and strep antigen.  Continue oxygen supplementation to keep O2 sat between 90 and 95%.  Continue OFEV.  Monitor labs in the morning  COPD exacerbation See above  Acute on chronic diastolic CHF - New York Heart Association class III See above Daily weights and low-salt diet  Hypertension BP stable Continue home medication  Type 2 diabetes mellitus A1c on 11/2017 7.3 Continue Lantus, Victoza and hold metformin Resistant SSI  Chronic atrial fibrillation On Cardizem and metoprolol for rate control On Xarelto for anticoagulation Appears stable  Chronic kidney disease stage III Creatinine appears to be at baseline Monitor renal function in a.m.  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 Earlie Server as an  outpatient. Unfortunately patient continues to smoke  Tobacco abuse Smoking cessation discussed    DVT prophylaxis: Xarelto Code  Status: Full code Family Communication: Wife at bedside Disposition Plan: Anticipate discharge to previous home environment.  Consults called: None Admission status: MedSurg/OBs   Time spent - 85 minutes, include 10 minutes on smoking cessation counseling   Chipper Oman MD [facility] Pager: Text Page via [email or web address]  [phone]  If 7PM-7AM, please contact night-coverage [email or web address] Password [facility]  12/24/2017, 5:42 PM

## 2017-12-24 NOTE — Telephone Encounter (Signed)
Pt called complaining of leg and feet swelling for the past 3 weeks and he has gained 8 pounds over the past month; he does report shortness of breath and coughing with exertion; he states that he started wearing his oxygen at 4 liters/min all the time 2 weeks ago; recommendations made per nurse triage; spoke with [place] at [facility], and she states that per Jodi Mourning the pt should go to; the pt verbalizes understanding and states that he will get his friend to take him; will also route to office for notification.   Reason for Disposition . Patient sounds very sick or weak to the triager  Answer Assessment - Initial Assessment Questions 1. ONSET: "When did the swelling start?" (e.g., minutes, hours, days)     3 weeks ago 2. LOCATION: "What part of the leg is swollen?"  "Are both legs swollen or just one leg?"     Both legs and feet 3. SEVERITY: "How bad is the swelling?" (e.g., localized; mild, moderate, severe)  - Localized - small area of swelling localized to one leg  - MILD pedal edema - swelling limited to foot and ankle, pitting edema < 1/4 inch (6 mm) deep, rest and elevation eliminate most or all swelling  - MODERATE edema - swelling of lower leg to knee, pitting edema > 1/4 inch (6 mm) deep, rest and elevation only partially reduce swelling  - SEVERE edema - swelling extends above knee, facial or hand swelling present      Severe; states he does not put his legs up 4. REDNESS: "Does the swelling look red or infected?"     Yes, he had boils on them that burst about a week ago and he has been treating them with triamcinolone cream .1% 5. PAIN: "Is the swelling painful to touch?" If so, ask: "How painful is it?"   (Scale 1-10; mild, moderate or severe)     Mild to moderate 6. FEVER: "Do you have a fever?" If so, ask: "What is it, how was it measured, and when did it start?"      no 7. CAUSE: "What do you think is causing the leg swelling?"     Not getting enough sleep and laying  down 8. MEDICAL HISTORY: "Do you have a history of heart failure, kidney disease, liver failure, or cancer?"     Heart failure 9. RECURRENT SYMPTOM: "Have you had leg swelling before?" If so, ask: "When was the last time?" "What happened that time?"     yes 10. OTHER SYMPTOMS: "Do you have any other symptoms?" (e.g., chest pain, difficulty breathing)      Not able to lay down and sleep since at least March when he saw pulmonology 11. PREGNANCY: "Is there any chance you are pregnant?" "When was your last menstrual period?"      n/a  Protocols used: LEG SWELLING AND EDEMA-A-AH

## 2017-12-24 NOTE — Progress Notes (Signed)
PHARMACY NOTE -  Ceftriaxone  Pharmacy has been consulted to dose Ceftriaxone for CAP. Will order Ceftriaxone 2g IV q24h x 7 days.  No renal adjustment needed; therefore, pharmacy will sign off since need for further dosage adjustment appears unlikely at present.    Please reconsult if a change in clinical status warrants re-evaluation of dosage.  Thank you for the consult.  Peggyann Juba, PharmD, BCPS Pager: [phone] 12/24/2017 6:12 PM

## 2017-12-24 NOTE — ED Triage Notes (Signed)
Patient c/o increased SOB and lower extremity swelling worsening x3 weeks. On 4-5L  at home. Hx lung cancer. Completed radiation last year. Reports chronic cough. States 86% is baseline O2 saturation.

## 2017-12-25 DIAGNOSIS — I5032 Chronic diastolic (congestive) heart failure: Secondary | ICD-10-CM | POA: Diagnosis not present

## 2017-12-25 DIAGNOSIS — Z801 Family history of malignant neoplasm of trachea, bronchus and lung: Secondary | ICD-10-CM | POA: Diagnosis not present

## 2017-12-25 DIAGNOSIS — I25719 Atherosclerosis of autologous vein coronary artery bypass graft(s) with unspecified angina pectoris: Secondary | ICD-10-CM | POA: Diagnosis present

## 2017-12-25 DIAGNOSIS — I481 Persistent atrial fibrillation: Secondary | ICD-10-CM | POA: Diagnosis not present

## 2017-12-25 DIAGNOSIS — E1159 Type 2 diabetes mellitus with other circulatory complications: Secondary | ICD-10-CM | POA: Diagnosis not present

## 2017-12-25 DIAGNOSIS — Z9889 Other specified postprocedural states: Secondary | ICD-10-CM | POA: Diagnosis not present

## 2017-12-25 DIAGNOSIS — J181 Lobar pneumonia, unspecified organism: Secondary | ICD-10-CM | POA: Diagnosis not present

## 2017-12-25 DIAGNOSIS — I351 Nonrheumatic aortic (valve) insufficiency: Secondary | ICD-10-CM | POA: Diagnosis present

## 2017-12-25 DIAGNOSIS — I130 Hypertensive heart and chronic kidney disease with heart failure and stage 1 through stage 4 chronic kidney disease, or unspecified chronic kidney disease: Secondary | ICD-10-CM | POA: Diagnosis present

## 2017-12-25 DIAGNOSIS — J441 Chronic obstructive pulmonary disease with (acute) exacerbation: Secondary | ICD-10-CM | POA: Diagnosis present

## 2017-12-25 DIAGNOSIS — J84112 Idiopathic pulmonary fibrosis: Secondary | ICD-10-CM | POA: Diagnosis present

## 2017-12-25 DIAGNOSIS — I5033 Acute on chronic diastolic (congestive) heart failure: Secondary | ICD-10-CM | POA: Diagnosis not present

## 2017-12-25 DIAGNOSIS — K219 Gastro-esophageal reflux disease without esophagitis: Secondary | ICD-10-CM | POA: Diagnosis present

## 2017-12-25 DIAGNOSIS — F17210 Nicotine dependence, cigarettes, uncomplicated: Secondary | ICD-10-CM | POA: Diagnosis present

## 2017-12-25 DIAGNOSIS — Z716 Tobacco abuse counseling: Secondary | ICD-10-CM

## 2017-12-25 DIAGNOSIS — I872 Venous insufficiency (chronic) (peripheral): Secondary | ICD-10-CM | POA: Diagnosis present

## 2017-12-25 DIAGNOSIS — L409 Psoriasis, unspecified: Secondary | ICD-10-CM | POA: Diagnosis present

## 2017-12-25 DIAGNOSIS — J9621 Acute and chronic respiratory failure with hypoxia: Secondary | ICD-10-CM | POA: Diagnosis not present

## 2017-12-25 DIAGNOSIS — C384 Malignant neoplasm of pleura: Secondary | ICD-10-CM | POA: Diagnosis not present

## 2017-12-25 DIAGNOSIS — E785 Hyperlipidemia, unspecified: Secondary | ICD-10-CM | POA: Diagnosis present

## 2017-12-25 DIAGNOSIS — C3411 Malignant neoplasm of upper lobe, right bronchus or lung: Secondary | ICD-10-CM | POA: Diagnosis present

## 2017-12-25 DIAGNOSIS — J918 Pleural effusion in other conditions classified elsewhere: Secondary | ICD-10-CM | POA: Diagnosis present

## 2017-12-25 DIAGNOSIS — C3491 Malignant neoplasm of unspecified part of right bronchus or lung: Secondary | ICD-10-CM | POA: Diagnosis not present

## 2017-12-25 DIAGNOSIS — G4733 Obstructive sleep apnea (adult) (pediatric): Secondary | ICD-10-CM | POA: Diagnosis present

## 2017-12-25 DIAGNOSIS — J910 Malignant pleural effusion: Secondary | ICD-10-CM | POA: Diagnosis not present

## 2017-12-25 DIAGNOSIS — I272 Pulmonary hypertension, unspecified: Secondary | ICD-10-CM | POA: Diagnosis present

## 2017-12-25 DIAGNOSIS — I110 Hypertensive heart disease with heart failure: Secondary | ICD-10-CM | POA: Diagnosis not present

## 2017-12-25 DIAGNOSIS — N183 Chronic kidney disease, stage 3 (moderate): Secondary | ICD-10-CM | POA: Diagnosis present

## 2017-12-25 DIAGNOSIS — J90 Pleural effusion, not elsewhere classified: Secondary | ICD-10-CM | POA: Diagnosis not present

## 2017-12-25 DIAGNOSIS — E669 Obesity, unspecified: Secondary | ICD-10-CM | POA: Diagnosis present

## 2017-12-25 DIAGNOSIS — E1151 Type 2 diabetes mellitus with diabetic peripheral angiopathy without gangrene: Secondary | ICD-10-CM | POA: Diagnosis present

## 2017-12-25 DIAGNOSIS — J700 Acute pulmonary manifestations due to radiation: Secondary | ICD-10-CM | POA: Diagnosis present

## 2017-12-25 DIAGNOSIS — I2510 Atherosclerotic heart disease of native coronary artery without angina pectoris: Secondary | ICD-10-CM | POA: Diagnosis present

## 2017-12-25 DIAGNOSIS — E1165 Type 2 diabetes mellitus with hyperglycemia: Secondary | ICD-10-CM | POA: Diagnosis present

## 2017-12-25 LAB — COMPREHENSIVE METABOLIC PANEL
ALBUMIN: 3.3 g/dL — AB (ref 3.5–5.0)
ALK PHOS: 50 U/L (ref 38–126)
ALT: 25 U/L (ref 17–63)
AST: 21 U/L (ref 15–41)
Anion gap: 11 (ref 5–15)
BILIRUBIN TOTAL: 0.5 mg/dL (ref 0.3–1.2)
BUN: 22 mg/dL — ABNORMAL HIGH (ref 6–20)
CALCIUM: 8.9 mg/dL (ref 8.9–10.3)
CO2: 25 mmol/L (ref 22–32)
Chloride: 103 mmol/L (ref 101–111)
Creatinine, Ser: 1.21 mg/dL (ref 0.61–1.24)
GFR calc Af Amer: >60 mL/min (ref >60)
GLUCOSE: 162 mg/dL — AB (ref 65–99)
Potassium: 4.5 mmol/L (ref 3.5–5.1)
Sodium: 139 mmol/L (ref 135–145)
TOTAL PROTEIN: 7.3 g/dL (ref 6.5–8.1)

## 2017-12-25 LAB — GLUCOSE, CAPILLARY
GLUCOSE-CAPILLARY: 188 mg/dL — AB (ref 65–99)
GLUCOSE-CAPILLARY: 259 mg/dL — AB (ref 65–99)
Glucose-Capillary: 215 mg/dL — ABNORMAL HIGH (ref 65–99)
Glucose-Capillary: 157 mg/dL — ABNORMAL HIGH (ref 65–99)

## 2017-12-25 LAB — CBC
HCT: 47.7 % (ref 39.0–52.0)
HEMOGLOBIN: 15.7 g/dL (ref 13.0–17.0)
MCH: 30.8 pg (ref 26.0–34.0)
MCHC: 32.9 g/dL (ref 30.0–36.0)
MCV: 93.7 fL (ref 78.0–100.0)
Platelets: 153 10*3/uL (ref 150–400)
RBC: 5.09 MIL/uL (ref 4.22–5.81)
RDW: 14.5 % (ref 11.5–15.5)
WBC: 8.4 10*3/uL (ref 4.0–10.5)

## 2017-12-25 MED ORDER — IPRATROPIUM-ALBUTEROL 0.5-2.5 (3) MG/3ML IN SOLN
3.0000 mL | Freq: Three times a day (TID) | RESPIRATORY_TRACT | Status: DC
  Administered 2017-12-25 – 2017-12-29 (×10): 3 mL via RESPIRATORY_TRACT
  Filled 2017-12-25 (×10): qty 3

## 2017-12-25 MED ORDER — SALINE SPRAY 0.65 % NA SOLN
2.0000 | NASAL | Status: DC | PRN
Start: 2017-12-25 — End: 2017-12-29
  Administered 2017-12-25: 2 via NASAL
  Filled 2017-12-25: qty 44

## 2017-12-25 MED ORDER — METHYLPREDNISOLONE SODIUM SUCC 40 MG IJ SOLR
40.0000 mg | Freq: Two times a day (BID) | INTRAMUSCULAR | Status: DC
  Administered 2017-12-26 – 2017-12-28 (×6): 40 mg via INTRAVENOUS
  Filled 2017-12-25 (×6): qty 1

## 2017-12-25 MED ORDER — SALINE SPRAY 0.65 % NA SOLN: 1.0000 | NASAL | Status: DC | PRN

## 2017-12-25 MED ORDER — NICOTINE 21 MG/24HR TD PT24
21.0000 mg | MEDICATED_PATCH | Freq: Every day | TRANSDERMAL | Status: DC
  Administered 2017-12-25 – 2017-12-29 (×5): 21 mg via TRANSDERMAL
  Filled 2017-12-25 (×5): qty 1

## 2017-12-25 MED ORDER — GUAIFENESIN ER 600 MG PO TB12
600.0000 mg | ORAL_TABLET | Freq: Two times a day (BID) | ORAL | Status: DC
  Administered 2017-12-25 – 2017-12-29 (×8): 600 mg via ORAL
  Filled 2017-12-25 (×8): qty 1

## 2017-12-25 NOTE — Progress Notes (Signed)
Patient Demographics:    Neil Carroll, is a 67 y.o. male, DOB - Aug 24, 1951, YWV:[id number]  Admit date - 12/24/2017   Admitting Physician Doreatha Lew, MD  Outpatient Primary MD for the patient is Hoyt Koch, MD  LOS - 0   Chief Complaint  Patient presents with  . Shortness of Breath  . Leg Swelling        Subjective:    Neil Carroll today has no fevers, no emesis,  No chest pain, cough and shortness of breath persist,  Assessment  & Plan :    Active Problems:   Acute on chronic respiratory failure [facility])   Brief summary:- 67 y.o. male with medical history significant of chronic diastolic heart failure, chronic respiratory failure with ILD and COPD, lung Ca oxygen dependent at 4L, CAD, chronic A. fib psoriasis and venous insufficiency and a  Heavy smoker admitted on 12/24/2017 with worsening respiratory status and acute on chronic hypoxic respiratory failure   Plan:- 1) acute on chronic hypoxic respiratory failure-suspect primarily secondary to COPD exacerbation, pro calcitonin is not elevated, CT chest with findings reflective of possible radiation pneumonitis in the setting of underlying lung malignancy with new large right-sided pleural effusion, cannot rule out superimposed right-sided pneumonia, patient may need right-sided thoracentesis if respiratory symptoms fail to improve, okay to treat empirically with Rocephin/azithromycin, continue bronchodilators and mucolytics, decrease Solu-Medrol to 40 mg every 12 hours  2)HFpEF-at baseline patient has chronic diastolic CHF exacerbation last known EF 60-65%, patient does have increased lower extremity edema, some orthopnea, BNP slightly higher than previous baseline, suspect some component of acute on chronic diastolic CHF exacerbation, continue Lasix 80 mg IV every 12 hours monitor fluid input and output and weight  closely  3)DM-anticipate worsening glycemic control due to steroids, last A1c was 7.3, okay to continue Lantus 30 units daily, Victoza, hold metformin for now, Use Novolog/Humalog Sliding scale insulin with Accu-Cheks/Fingersticks as ordered   4) chronic atrial fibrillation-continue Xarelto for anticoagulation and metoprolol 100 mg in a.m. 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 Earlie Server as an outpatient  6) tobacco abuse-patient smokes 1 and  1/2 pack to 2 packs/day, Smoking cessation counseling for 4 minutes today, consider nicotine patch  7)CKDIII-avoid nephrotoxic agents, monitor renal function with diuresis   Code Status : Full    Disposition Plan  : home   DVT Prophylaxis  :  Xarelto  Lab Results  Component Value Date   PLT 153 12/25/2017    Inpatient Medications  Scheduled Meds: . arformoterol  15 mcg Nebulization BID  . azithromycin  500 mg Oral Daily  . budesonide (PULMICORT) nebulizer solution  0.5 mg Nebulization BID  . diltiazem  180 mg Oral BID  . furosemide  80 mg Intravenous Q12H  . insulin aspart  0-20 Units Subcutaneous TID WC  . insulin glargine  30 Units Subcutaneous Q2200  . ipratropium-albuterol  3 mL Nebulization TID  . liraglutide  1.8 mg Subcutaneous Daily  . losartan  25 mg Oral Daily  . methylPREDNISolone (SOLU-MEDROL) injection  60 mg Intravenous Q12H  . metoprolol tartrate  100 mg Oral Daily  . metoprolol  tartrate  150 mg Oral QHS  . Nintedanib  150 mg Oral BID WC  . potassium chloride SA  20 mEq Oral BID  . rivaroxaban  20 mg Oral QAC lunch  . rosuvastatin  10 mg Oral Daily  . tamsulosin  0.4 mg Oral Daily   Continuous Infusions: . cefTRIAXone (ROCEPHIN)  IV     PRN Meds:.acetaminophen **OR** acetaminophen, collagenase, ipratropium-albuterol, ondansetron **OR** ondansetron (ZOFRAN) IV, senna-docusate, sodium  chloride    Anti-infectives (From admission, onward)   Start     Dose/Rate Route Frequency Ordered Stop   12/25/17 1600  cefTRIAXone (ROCEPHIN) 2 g in sodium chloride 0.9 % 100 mL IVPB     2 g 200 mL/hr over 30 Minutes Intravenous Every 24 hours 12/24/17 1811 12/31/17 1559   12/24/17 2000  azithromycin (ZITHROMAX) tablet 500 mg     500 mg Oral Daily 12/24/17 1805 12/27/17 0959   12/24/17 1645  cefTRIAXone (ROCEPHIN) 2 g in sodium chloride 0.9 % 100 mL IVPB     2 g 200 mL/hr over 30 Minutes Intravenous  Once 12/24/17 1635 12/24/17 1749   12/24/17 1645  doxycycline (VIBRAMYCIN) 100 mg in sodium chloride 0.9 % 250 mL IVPB     100 mg 125 mL/hr over 120 Minutes Intravenous  Once 12/24/17 1635 12/24/17 1857        Objective:   Vitals:   12/25/17 0403 12/25/17 0500 12/25/17 0808 12/25/17 1430  BP: 112/64   (!) 105/56  Pulse: 91   82  Resp: 20   16  Temp: 97.9 F (36.6 C)   98.1 F (36.7 C)  TempSrc: Oral   Oral  SpO2: 94%  93% 93%  Weight:  124.1 kg (273 lb 8 oz)    Height:        Wt Readings from Last 3 Encounters:  12/25/17 124.1 kg (273 lb 8 oz)  12/08/17 122 kg (269 lb)  11/24/17 122.9 kg (271 lb)     Intake/Output Summary (Last 24 hours) at 12/25/2017 1753 Last data filed at 12/25/2017 1202 Gross per 24 hour  Intake 960 ml  Output 700 ml  Net 260 ml     Physical Exam  Gen:- Awake Alert, obese able to speak in short sentences HEENT:- [place].AT, No sclera icterus Neck-Supple Neck,No JVD,.  Lungs-diminished in bases right more than left, faint bibasilar rales, few scattered wheezes in upper lung fields CV- S1, S2 normal, irregular  abd-  +ve B.Sounds, Abd Soft, increased truncal adiposity Extremity/Skin:-Chronic venous stasis dermatitis with discoloration, pedal pulses noted  psych-affect is appropriate, oriented x3 Neuro-no new focal deficits, no tremors   Data Review:   Micro Results Recent Results (from the past 240 hour(s))  Blood culture (routine x 2)      Status: None (Preliminary result)   Collection Time: 12/24/17  3:46 PM  Result Value Ref Range Status   Specimen Description   Final    BLOOD LEFT FOREARM Performed at [facility] [address] [place], [place] [address]    Special Requests   Final    BOTTLES DRAWN AEROBIC AND ANAEROBIC Blood Culture adequate volume Performed at [facility] [address] [place], [place] [address]    Culture   Final    NO GROWTH < 24 HOURS Performed at [facility] [address] [place],  [address]    Report Status PENDING  Incomplete  Blood culture (routine x 2)     Status: None (Preliminary  result)   Collection Time: 12/24/17  3:46 PM  Result Value Ref Range Status   Specimen Description   Final    BLOOD BLOOD RIGHT FOREARM Performed at [facility] [address], [place], [place] [address]    Special Requests   Final    BOTTLES DRAWN AEROBIC AND ANAEROBIC Blood Culture adequate volume Performed at [facility] [address] [place] [address] [place], [place] [address]    Culture   Final    NO GROWTH < 24 HOURS Performed at [facility] [address], [place], [place] [address]    Report Status PENDING  Incomplete    Radiology Reports Dg Chest 2 View  Result Date: 12/24/2017 CLINICAL DATA:  Shortness of breath and lower extremity swelling for 3 weeks. EXAM: CHEST - 2 VIEW COMPARISON:  10/15/2017 FINDINGS: Interval progression of pleuroparenchymal opacity in the right lateral mid hemithorax. Diffuse underlying interstitial lung disease again noted. Cardiopericardial silhouette is at upper limits of normal for size. The visualized bony structures of the thorax are intact. Telemetry leads overlie the chest. IMPRESSION: Interval progression of lateral pleuroparenchymal opacity in the right mid lung. CT chest may be warranted to further evaluate. Underlying changes of chronic interstitial lung disease.  Electronically Signed   By: Misty Stanley M.D.   On: 12/24/2017 15:08   Ct Chest High Resolution  Result Date: 12/25/2017 CLINICAL DATA:  Lung cancer, interstitial lung disease EXAM: CT CHEST WITHOUT CONTRAST TECHNIQUE: Multidetector CT imaging of the chest was performed following the standard protocol without intravenous contrast. High resolution imaging of the lungs, as well as inspiratory and expiratory imaging, was performed. COMPARISON:  Chest CT from 07/07/2017 FINDINGS: Cardiovascular: Coronary, aortic arch, and branch vessel atherosclerotic vascular disease. Mild cardiomegaly. Enlarged main pulmonary artery. Mediastinum/Nodes: Prevascular lymph node 1.1 cm in short axis on image 36/2, formerly 0.9 cm. AP window lymph node 0.9 cm in short axis on image 44/2, formerly 0.7 cm. Partially calcified right lower paratracheal lymph node 1.5 cm in short axis on image 53/2, formerly 1.3 cm by my measurements. Suspected right hilar adenopathy but difficult to measure due to the lack of IV contrast and difficulty separating lymph nodes from vessels in the hilum. Lungs/Pleura: Large right pleural effusion, nonspecific for transudative or exudative etiology, but with some suspected pleural thickening superiorly on the right side for example on image 24/2. This would tend to favor an exudative etiology. There also appear to be potential loculated components laterally and along the fissures. Severe bilateral emphysema. Coarse interstitial accentuation peripherally in the right upper lobe with associated bandlike airspace opacities which are significantly increased compared to prior. The appearance could represent radiation pneumonitis if the patient is received right upper lobe radiation therapy, versus pneumonia. Progressive tumor not excluded. Peripheral interstitial accentuation in the left lung is relatively coarse and potentially with early honeycombing, and favors the lung base. No obvious air trapping on  expiratory images. Upper Abdomen: No adrenal mass identified. Musculoskeletal: Unremarkable IMPRESSION: 1. Worsening peripheral airspace opacities in the right lung, especially the right upper lobe, probably from radiation pneumonitis, less likely pneumonia or progressive lung cancer. Airspace an underlying neoplastic lesion opacity is not hidden by the surrounding readily excluded. There is surrounding coarse interstitial accentuation and again possibly from radiation fibrosis/radiation pneumonitis. 2. This is superimposed upon a background of severe emphysema and coarse peripheral interstitial accentuation favoring the lung bases likely from usual interstitial pneumonia (UIP). 3. New large right pleural effusion with some loculated  components. There some areas of pleural thickening which could be from tumor or inflammation on the right. Mild mediastinal adenopathy, increased from prior. 4. Aortic Atherosclerosis (ICD10-I70.0) and Emphysema (ICD10-J43.9). Coronary atherosclerosis. Prominent main pulmonary artery likely from secondary pulmonary arterial hypertension. Electronically Signed   By: Van Clines M.D.   On: 12/25/2017 08:22     CBC Recent Labs  Lab 12/24/17 1546 12/25/17 0342  WBC 8.5 8.4  HGB 16.6 15.7  HCT 50.2 47.7  PLT 155 153  MCV 93.8 93.7  MCH 31.0 30.8  MCHC 33.1 32.9  RDW 14.7 14.5  LYMPHSABS 1.2  --   MONOABS 1.0  --   EOSABS 0.1  --   BASOSABS 0.0  --     Chemistries  Recent Labs  Lab 12/24/17 1546 12/25/17 0342  NA 142 139  K 4.3 4.5  CL 104 103  CO2 26 25  GLUCOSE 137* 162*  BUN 20 22*  CREATININE 1.23 1.21  CALCIUM 9.2 8.9  AST 21 21  ALT 22 25  ALKPHOS 57 50  BILITOT 0.7 0.5   ------------------------------------------------------------------------------------------------------------------ No results for input(s): CHOL, HDL, LDLCALC, TRIG, CHOLHDL, LDLDIRECT in the last 72 hours.  Lab Results  Component Value Date   HGBA1C 7.3 (H)  12/08/2017   ------------------------------------------------------------------------------------------------------------------ No results for input(s): TSH, T4TOTAL, T3FREE, THYROIDAB in the last 72 hours.  Invalid input(s): FREET3 ------------------------------------------------------------------------------------------------------------------ No results for input(s): VITAMINB12, FOLATE, FERRITIN, TIBC, IRON, RETICCTPCT in the last 72 hours.  Coagulation profile No results for input(s): INR, PROTIME in the last 168 hours.  No results for input(s): DDIMER in the last 72 hours.  Cardiac Enzymes Recent Labs  Lab 12/24/17 1546  TROPONINI <0.03   ------------------------------------------------------------------------------------------------------------------    Component Value Date/Time   BNP 246.2 (H) 12/24/2017 1546     Octave Montrose M.D on 12/25/2017 at 5:53 PM  Between 7am to 7pm - Pager - [phone]  After 7pm go to [email or web address] - password [email or web address]  [facility] -  Office  [phone]   Voice Recognition Viviann Spare dictation system was used to create this note, attempts have been made to correct errors. Please contact the author with questions and/or clarifications.

## 2017-12-26 LAB — GLUCOSE, CAPILLARY
GLUCOSE-CAPILLARY: 259 mg/dL — AB (ref 65–99)
Glucose-Capillary: 314 mg/dL — ABNORMAL HIGH (ref 65–99)
Glucose-Capillary: 147 mg/dL — ABNORMAL HIGH (ref 65–99)
Glucose-Capillary: 338 mg/dL — ABNORMAL HIGH (ref 65–99)

## 2017-12-26 NOTE — Progress Notes (Signed)
Patient Demographics:    Neil Carroll, is a 67 y.o. male, DOB - 23-Mar-1951, IAX:[id number]  Admit date - 12/24/2017   Admitting Physician Doreatha Lew, MD  Outpatient Primary MD for the patient is Hoyt Koch, MD  LOS - 1   Chief Complaint  Patient presents with  . Shortness of Breath  . Leg Swelling        Subjective:    [name] today has no fevers, no emesis,  No chest pain, cough and shortness of breath improving slightly, dyspnea on exertion persist  Assessment  & Plan :    Active Problems:   Acute on chronic respiratory failure (HCC)   Tobacco abuse counseling   Brief summary:- 67 y.o. male with medical history significant of chronic diastolic heart failure, chronic respiratory failure with ILD and COPD, lung Ca oxygen dependent at 4L, CAD, chronic A. fib psoriasis and venous insufficiency and a  Heavy smoker admitted on 12/24/2017 with worsening respiratory status and acute on chronic hypoxic respiratory failure   Plan:- 1) acute on chronic hypoxic respiratory failure-respiratory status and oxygen requirement is improving slowly , suspect primarily secondary to COPD exacerbation and Usual interstitial pneumonia (UIP), pro calcitonin is not elevated, CT chest with findings reflective of possible radiation pneumonitis in the setting of underlying lung malignancy with new large right-sided pleural effusion, has some superimposed right-sided pneumonia/UIP, patient may need right-sided thoracentesis if respiratory symptoms fail to improve, c/n  Rocephin/azithromycin, continue bronchodilators and mucolytics, continue Solu-Medrol  40 mg every 12 hours  2)HFpEF-at baseline patient has chronic diastolic CHF exacerbation last known EF 60-65%, patient does have increased lower extremity edema (venous stasis dermatitis), some orthopnea/DOE , BNP slightly higher than previous  baseline, suspect some component of acute on chronic diastolic CHF exacerbation, continue Lasix 80 mg IV every 12 hours monitor fluid input and output and weight closely  3)DM-anticipate worsening glycemic control due to steroids, last A1c was 7.3, continue Lantus 30 units daily, Victoza, hold metformin for now, Use Novolog/Humalog Sliding scale insulin with Accu-Cheks/Fingersticks as ordered   4) chronic atrial fibrillation- stable, continue Xarelto for anticoagulation and metoprolol 100 mg in a.m. 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 Earlie Server as an outpatient  6) tobacco abuse-patient smokes 1 and  1/2 pack to 2 packs/day, c/n nicotine patch  7)CKDIII-avoid nephrotoxic agents, monitor renal function with diuresis   Code Status : Full   Disposition Plan  : home  DVT Prophylaxis  :  Xarelto  Lab Results  Component Value Date   PLT 153 12/25/2017    Inpatient Medications  Scheduled Meds: . arformoterol  15 mcg Nebulization BID  . budesonide (PULMICORT) nebulizer solution  0.5 mg Nebulization BID  . diltiazem  180 mg Oral BID  . furosemide  80 mg Intravenous Q12H  . guaiFENesin  600 mg Oral BID  . insulin aspart  0-20 Units Subcutaneous TID WC  . insulin glargine  30 Units Subcutaneous Q2200  . ipratropium-albuterol  3 mL Nebulization TID  . liraglutide  1.8 mg Subcutaneous Daily  . losartan  25 mg Oral Daily  . methylPREDNISolone (SOLU-MEDROL) injection  40  mg Intravenous Q12H  . metoprolol tartrate  100 mg Oral Daily  . metoprolol tartrate  150 mg Oral QHS  . nicotine  21 mg Transdermal Daily  . Nintedanib  150 mg Oral BID WC  . potassium chloride SA  20 mEq Oral BID  . rivaroxaban  20 mg Oral QAC lunch  . rosuvastatin  10 mg Oral Daily  . tamsulosin  0.4 mg Oral Daily   Continuous Infusions: . cefTRIAXone (ROCEPHIN)  IV Stopped (12/25/17 1832)   PRN  Meds:.acetaminophen **OR** acetaminophen, collagenase, ipratropium-albuterol, ondansetron **OR** ondansetron (ZOFRAN) IV, senna-docusate, sodium chloride    Anti-infectives (From admission, onward)   Start     Dose/Rate Route Frequency Ordered Stop   12/25/17 1600  cefTRIAXone (ROCEPHIN) 2 g in sodium chloride 0.9 % 100 mL IVPB     2 g 200 mL/hr over 30 Minutes Intravenous Every 24 hours 12/24/17 1811 12/31/17 1559   12/24/17 2000  azithromycin (ZITHROMAX) tablet 500 mg     500 mg Oral Daily 12/24/17 1805 12/26/17 1020   12/24/17 1645  cefTRIAXone (ROCEPHIN) 2 g in sodium chloride 0.9 % 100 mL IVPB     2 g 200 mL/hr over 30 Minutes Intravenous  Once 12/24/17 1635 12/24/17 1749   12/24/17 1645  doxycycline (VIBRAMYCIN) 100 mg in sodium chloride 0.9 % 250 mL IVPB     100 mg 125 mL/hr over 120 Minutes Intravenous  Once 12/24/17 1635 12/24/17 1857        Objective:   Vitals:   12/25/17 2318 12/26/17 0700 12/26/17 0757 12/26/17 1220  BP: 109/62 116/69  96/60  Pulse:  82  81  Resp:  16  16  Temp:  97.9 F (36.6 C)  98.4 F (36.9 C)  TempSrc:  Oral  Oral  SpO2:  93% 96% 94%  Weight:  124.1 kg (273 lb 11.2 oz)    Height:        Wt Readings from Last 3 Encounters:  12/26/17 124.1 kg (273 lb 11.2 oz)  12/08/17 122 kg (269 lb)  11/24/17 122.9 kg (271 lb)     Intake/Output Summary (Last 24 hours) at 12/26/2017 1729 Last data filed at 12/26/2017 1652 Gross per 24 hour  Intake 1315 ml  Output 1750 ml  Net -435 ml     Physical Exam  Gen:- Awake Alert, obese , cooperative  HEENT:- [place].AT, No sclera icterus Neck-Supple Neck,No JVD,.  Nose- [place] 4 L/min Lungs-diminished in bases right more than left, right-sided rhonchi and rales,  few scattered wheezes in upper lung fields CV- S1, S2 normal, irregular  abd-  +ve B.Sounds, Abd Soft, increased truncal adiposity Extremity/Skin:-Chronic venous stasis dermatitis with discoloration, pedal pulses noted  psych-affect is appropriate,  oriented x3 Neuro-no new focal deficits, no tremors   Data Review:   Micro Results Recent Results (from the past 240 hour(s))  Blood culture (routine x 2)     Status: None (Preliminary result)   Collection Time: 12/24/17  3:46 PM  Result Value Ref Range Status   Specimen Description   Final    BLOOD LEFT FOREARM Performed at [facility] [address], [place], [place] [address]    Special Requests   Final    BOTTLES DRAWN AEROBIC AND ANAEROBIC Blood Culture adequate volume Performed at [facility] [address] [place], [place] [address]    Culture   Final    NO GROWTH 2 DAYS Performed at [facility]  [address], [place], [place] [address]    Report Status PENDING  Incomplete  Blood culture (routine x 2)     Status: None (Preliminary result)   Collection Time: 12/24/17  3:46 PM  Result Value Ref Range Status   Specimen Description   Final    BLOOD BLOOD RIGHT FOREARM Performed at [facility] [address], [place], [place] [address]    Special Requests   Final    BOTTLES DRAWN AEROBIC AND ANAEROBIC Blood Culture adequate volume Performed at [facility] [address], [place], [place] [address]    Culture   Final    NO GROWTH 2 DAYS Performed at [facility] [address] [place], [place] [address]    Report Status PENDING  Incomplete    Radiology Reports Dg Chest 2 View  Result Date: 12/24/2017 CLINICAL DATA:  Shortness of breath and lower extremity swelling for 3 weeks. EXAM: CHEST - 2 VIEW COMPARISON:  10/15/2017 FINDINGS: Interval progression of pleuroparenchymal opacity in the right lateral mid hemithorax. Diffuse underlying interstitial lung disease again noted. Cardiopericardial silhouette is at upper limits of normal for size. The visualized bony structures of the thorax are intact. Telemetry leads overlie the chest. IMPRESSION: Interval progression of lateral  pleuroparenchymal opacity in the right mid lung. CT chest may be warranted to further evaluate. Underlying changes of chronic interstitial lung disease. Electronically Signed   By: Misty Stanley M.D.   On: 12/24/2017 15:08   Ct Chest High Resolution  Result Date: 12/25/2017 CLINICAL DATA:  Lung cancer, interstitial lung disease EXAM: CT CHEST WITHOUT CONTRAST TECHNIQUE: Multidetector CT imaging of the chest was performed following the standard protocol without intravenous contrast. High resolution imaging of the lungs, as well as inspiratory and expiratory imaging, was performed. COMPARISON:  Chest CT from 07/07/2017 FINDINGS: Cardiovascular: Coronary, aortic arch, and branch vessel atherosclerotic vascular disease. Mild cardiomegaly. Enlarged main pulmonary artery. Mediastinum/Nodes: Prevascular lymph node 1.1 cm in short axis on image 36/2, formerly 0.9 cm. AP window lymph node 0.9 cm in short axis on image 44/2, formerly 0.7 cm. Partially calcified right lower paratracheal lymph node 1.5 cm in short axis on image 53/2, formerly 1.3 cm by my measurements. Suspected right hilar adenopathy but difficult to measure due to the lack of IV contrast and difficulty separating lymph nodes from vessels in the hilum. Lungs/Pleura: Large right pleural effusion, nonspecific for transudative or exudative etiology, but with some suspected pleural thickening superiorly on the right side for example on image 24/2. This would tend to favor an exudative etiology. There also appear to be potential loculated components laterally and along the fissures. Severe bilateral emphysema. Coarse interstitial accentuation peripherally in the right upper lobe with associated bandlike airspace opacities which are significantly increased compared to prior. The appearance could represent radiation pneumonitis if the patient is received right upper lobe radiation therapy, versus pneumonia. Progressive tumor not excluded. Peripheral interstitial  accentuation in the left lung is relatively coarse and potentially with early honeycombing, and favors the lung base. No obvious air trapping on expiratory images. Upper Abdomen: No adrenal mass identified. Musculoskeletal: Unremarkable IMPRESSION: 1. Worsening peripheral airspace opacities in the right lung, especially the right upper lobe, probably from radiation pneumonitis, less likely pneumonia or progressive lung cancer. Airspace an underlying neoplastic lesion opacity is not hidden by the surrounding readily excluded. There is surrounding coarse interstitial accentuation and again possibly from radiation fibrosis/radiation pneumonitis. 2. This is superimposed upon a background of severe emphysema  and coarse peripheral interstitial accentuation favoring the lung bases likely from usual interstitial pneumonia (UIP). 3. New large right pleural effusion with some loculated components. There some areas of pleural thickening which could be from tumor or inflammation on the right. Mild mediastinal adenopathy, increased from prior. 4. Aortic Atherosclerosis (ICD10-I70.0) and Emphysema (ICD10-J43.9). Coronary atherosclerosis. Prominent main pulmonary artery likely from secondary pulmonary arterial hypertension. Electronically Signed   By: Van Clines M.D.   On: 12/25/2017 08:22     CBC Recent Labs  Lab 12/24/17 1546 12/25/17 0342  WBC 8.5 8.4  HGB 16.6 15.7  HCT 50.2 47.7  PLT 155 153  MCV 93.8 93.7  MCH 31.0 30.8  MCHC 33.1 32.9  RDW 14.7 14.5  LYMPHSABS 1.2  --   MONOABS 1.0  --   EOSABS 0.1  --   BASOSABS 0.0  --     Chemistries  Recent Labs  Lab 12/24/17 1546 12/25/17 0342  NA 142 139  K 4.3 4.5  CL 104 103  CO2 26 25  GLUCOSE 137* 162*  BUN 20 22*  CREATININE 1.23 1.21  CALCIUM 9.2 8.9  AST 21 21  ALT 22 25  ALKPHOS 57 50  BILITOT 0.7 0.5   ------------------------------------------------------------------------------------------------------------------ No results  for input(s): CHOL, HDL, LDLCALC, TRIG, CHOLHDL, LDLDIRECT in the last 72 hours.  Lab Results  Component Value Date   HGBA1C 7.3 (H) 12/08/2017   ------------------------------------------------------------------------------------------------------------------ No results for input(s): TSH, T4TOTAL, T3FREE, THYROIDAB in the last 72 hours.  Invalid input(s): FREET3 ------------------------------------------------------------------------------------------------------------------ No results for input(s): VITAMINB12, FOLATE, FERRITIN, TIBC, IRON, RETICCTPCT in the last 72 hours.  Coagulation profile No results for input(s): INR, PROTIME in the last 168 hours.  No results for input(s): DDIMER in the last 72 hours.  Cardiac Enzymes Recent Labs  Lab 12/24/17 1546  TROPONINI <0.03   ------------------------------------------------------------------------------------------------------------------    Component Value Date/Time   BNP 246.2 (H) 12/24/2017 1546     Akane Tessier M.D on 12/26/2017 at 5:29 PM  Between 7am to 7pm - Pager - [phone]  After 7pm go to [email or web address] - password [email or web address]  [facility] -  Office  [phone]   Voice Recognition Viviann Spare dictation system was used to create this note, attempts have been made to correct errors. Please contact the author with questions and/or clarifications.

## 2017-12-27 DIAGNOSIS — C3491 Malignant neoplasm of unspecified part of right bronchus or lung: Secondary | ICD-10-CM

## 2017-12-27 DIAGNOSIS — E1159 Type 2 diabetes mellitus with other circulatory complications: Secondary | ICD-10-CM

## 2017-12-27 DIAGNOSIS — I110 Hypertensive heart disease with heart failure: Secondary | ICD-10-CM

## 2017-12-27 DIAGNOSIS — J181 Lobar pneumonia, unspecified organism: Secondary | ICD-10-CM

## 2017-12-27 DIAGNOSIS — I5032 Chronic diastolic (congestive) heart failure: Secondary | ICD-10-CM

## 2017-12-27 DIAGNOSIS — I5033 Acute on chronic diastolic (congestive) heart failure: Secondary | ICD-10-CM

## 2017-12-27 DIAGNOSIS — J189 Pneumonia, unspecified organism: Secondary | ICD-10-CM

## 2017-12-27 DIAGNOSIS — I481 Persistent atrial fibrillation: Secondary | ICD-10-CM

## 2017-12-27 DIAGNOSIS — J9621 Acute and chronic respiratory failure with hypoxia: Principal | ICD-10-CM

## 2017-12-27 LAB — GLUCOSE, CAPILLARY
GLUCOSE-CAPILLARY: 241 mg/dL — AB (ref 65–99)
GLUCOSE-CAPILLARY: 267 mg/dL — AB (ref 65–99)
Glucose-Capillary: 288 mg/dL — ABNORMAL HIGH (ref 65–99)
Glucose-Capillary: 352 mg/dL — ABNORMAL HIGH (ref 65–99)

## 2017-12-27 LAB — BASIC METABOLIC PANEL
Anion gap: 12 (ref 5–15)
BUN: 38 mg/dL — ABNORMAL HIGH (ref 6–20)
CHLORIDE: 97 mmol/L — AB (ref 101–111)
CO2: 28 mmol/L (ref 22–32)
CREATININE: 1.38 mg/dL — AB (ref 0.61–1.24)
Calcium: 8.6 mg/dL — ABNORMAL LOW (ref 8.9–10.3)
GFR calc non Af Amer: 51 mL/min — ABNORMAL LOW (ref >60)
GFR, EST AFRICAN AMERICAN: 60 mL/min — AB (ref >60)
Glucose, Bld: 338 mg/dL — ABNORMAL HIGH (ref 65–99)
POTASSIUM: 4.3 mmol/L (ref 3.5–5.1)
Sodium: 137 mmol/L (ref 135–145)

## 2017-12-27 NOTE — Progress Notes (Signed)
PROGRESS NOTE    Neil Carroll  KCM:[id number] DOB: 09/27/1950 DOA: 12/24/2017 PCP: Hoyt Koch, MD    Brief Narrative:  67 y.o.malewith medical history significant ofchronic diastolic heart failure, chronic respiratory failure with ILD and COPD, lung Ca oxygen dependent at 4L, CAD, chronic A. fib psoriasis and venous insufficiency and a Heavy smoker admitted on 12/24/2017 with worsening respiratory status and acute on chronic hypoxic respiratory failure.     Assessment & Plan:   Active Problems:   Acute on chronic respiratory failure (HCC)   Tobacco abuse counseling   Acute on chronic respiratory failure with hypoxia currently on 5 lit of [place] oxygen, probably secondary to acute COPD exacerbation and UIP Flare up.  CT chest shows possible radiation pneumonitis, with new large right pleural effusion.  Continues to have coughing and dyspnea on ambulation.  Continue with IV steroids and plan for US thoracentesis.  Continue with pulmicort/brovana and duonebs.  IV antibiotics for bronchitis.     Mild acute on Chronic diastolic heart failure:  Worsening pedal edema and DOE.  Started on IV lasix BID, at 80 mg.  The intake and output is not reflective. Will need strict intake and output and daily weights.  Resume metoprolol and cozaar.  Check renal parameters and electrolytes while on IV lasix.   Diabetes mellitus:  CBG (last 3)  Recent Labs    12/26/17 2113 12/27/17 0739 12/27/17 1123  GLUCAP 338* 288* 241*   Uncontrolled with hyperglycemia secondary to steroids.  Add novolog 3 units TIDAC, in addition to SSI and lantus.    Chronic atrial fibrillation;  Rate controlled and is on xarelto for anti coagulation.  On cardizem 180 mg daily for rate control.   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 Earlie Server as an outpatient   Tobacco abuse:  On nicotine patch. Counseling given.    Stage 3 CKD:  Last BMP from  4/6. Creatinine at baseline.check bmp TODAY.       DVT prophylaxis: lovenox.  Code Status:full code.  Family Communication: none at bedside.  Disposition Plan:possible home in 1 to 2 days.   Consultants:   None.    Procedures:   Antimicrobials:    Subjective: Reports feeling better but not back to baseline.   Objective: Vitals:   12/26/17 2202 12/27/17 0631 12/27/17 0759 12/27/17 1300  BP: 124/65 104/64  (!) 114/56  Pulse: 91 82  83  Resp: 16 18  18   Temp: 98.2 F (36.8 C) 98.9 F (37.2 C)  99 F (37.2 C)  TempSrc: Oral Oral  Oral  SpO2: 95% 96% 94% 95%  Weight:      Height:        Intake/Output Summary (Last 24 hours) at 12/27/2017 1453 Last data filed at 12/27/2017 1301 Gross per 24 hour  Intake 1560 ml  Output 520 ml  Net 1040 ml   Filed Weights   12/24/17 1923 12/25/17 0500 12/26/17 0700  Weight: 122 kg (269 lb) 124.1 kg (273 lb 8 oz) 124.1 kg (273 lb 11.2 oz)    Examination:  General exam: Appears calm and comfortable on 5 lit of   oxygen.  Respiratory system: bilateral scattered wheezing heard.  Cardiovascular system: S1 & S2 heard, RRR. No JVD,  2+ pedal edema. Gastrointestinal system: Abdomen is nondistended, soft and nontender. No organomegaly or masses felt. Normal bowel sounds heard. Central nervous system: Alert and oriented. No focal neurological deficits. Extremities: chronic venous stasis, hyperpigmentation changes.  Skin: see above.  Psychiatry: Judgement and insight appear normal. Mood & affect appropriate.     Data Reviewed: I have personally reviewed following labs and imaging studies  CBC: Recent Labs  Lab 12/24/17 1546 12/25/17 0342  WBC 8.5 8.4  NEUTROABS 6.1  --   HGB 16.6 15.7  HCT 50.2 47.7  MCV 93.8 93.7  PLT 155 767   Basic Metabolic Panel: Recent Labs  Lab 12/24/17 1546 12/25/17 0342  NA 142 139  K 4.3 4.5  CL 104 103  CO2 26 25  GLUCOSE 137* 162*  BUN 20 22*  CREATININE 1.23 1.21  CALCIUM 9.2 8.9    GFR: Estimated Creatinine Clearance: 79.5 mL/min (by C-G formula based on SCr of 1.21 mg/dL). Liver Function Tests: Recent Labs  Lab 12/24/17 1546 12/25/17 0342  AST 21 21  ALT 22 25  ALKPHOS 57 50  BILITOT 0.7 0.5  PROT 7.8 7.3  ALBUMIN 3.6 3.3*   No results for input(s): LIPASE, AMYLASE in the last 168 hours. No results for input(s): AMMONIA in the last 168 hours. Coagulation Profile: No results for input(s): INR, PROTIME in the last 168 hours. Cardiac Enzymes: Recent Labs  Lab 12/24/17 1546  TROPONINI <0.03   BNP (last 3 results) Recent Labs    10/13/17 1202 10/21/17 1203  PROBNP 992* 962*   HbA1C: No results for input(s): HGBA1C in the last 72 hours. CBG: Recent Labs  Lab 12/26/17 1135 12/26/17 1651 12/26/17 2113 12/27/17 0739 12/27/17 1123  GLUCAP 259* 147* 338* 288* 241*   Lipid Profile: No results for input(s): CHOL, HDL, LDLCALC, TRIG, CHOLHDL, LDLDIRECT in the last 72 hours. Thyroid Function Tests: No results for input(s): TSH, T4TOTAL, FREET4, T3FREE, THYROIDAB in the last 72 hours. Anemia Panel: No results for input(s): VITAMINB12, FOLATE, FERRITIN, TIBC, IRON, RETICCTPCT in the last 72 hours. Sepsis Labs: Recent Labs  Lab 12/24/17 1748  PROCALCITON <0.10    Recent Results (from the past 240 hour(s))  Blood culture (routine x 2)     Status: None (Preliminary result)   Collection Time: 12/24/17  3:46 PM  Result Value Ref Range Status   Specimen Description   Final    BLOOD LEFT FOREARM Performed at [facility], [place] [address], [place], [place] [address]    Special Requests   Final    BOTTLES DRAWN AEROBIC AND ANAEROBIC Blood Culture adequate volume Performed at [facility] [address] [place], [place] [address]    Culture   Final    NO GROWTH 3 DAYS Performed at [facility], [place] [address], [place], [place] [address]    Report Status PENDING  Incomplete  Blood culture (routine x  2)     Status: None (Preliminary result)   Collection Time: 12/24/17  3:46 PM  Result Value Ref Range Status   Specimen Description   Final    BLOOD BLOOD RIGHT FOREARM Performed at [facility] [address] [place], [place] [address]    Special Requests   Final    BOTTLES DRAWN AEROBIC AND ANAEROBIC Blood Culture adequate volume Performed at [facility] [address] [place], [place] [address]    Culture   Final    NO GROWTH 3 DAYS Performed at [facility], [place] [address] [place], [place] [address]    Report Status PENDING  Incomplete         Radiology Studies: No results found.  Scheduled Meds: . arformoterol  15 mcg Nebulization BID  . budesonide (PULMICORT) nebulizer solution  0.5 mg Nebulization BID  . diltiazem  180 mg Oral BID  . furosemide  80 mg Intravenous Q12H  . guaiFENesin  600 mg Oral BID  . insulin aspart  0-20 Units Subcutaneous TID WC  . insulin glargine  30 Units Subcutaneous Q2200  . ipratropium-albuterol  3 mL Nebulization TID  . liraglutide  1.8 mg Subcutaneous Daily  . losartan  25 mg Oral Daily  . methylPREDNISolone (SOLU-MEDROL) injection  40 mg Intravenous Q12H  . metoprolol tartrate  100 mg Oral Daily  . metoprolol tartrate  150 mg Oral QHS  . nicotine  21 mg Transdermal Daily  . Nintedanib  150 mg Oral BID WC  . potassium chloride SA  20 mEq Oral BID  . rivaroxaban  20 mg Oral QAC lunch  . rosuvastatin  10 mg Oral Daily  . tamsulosin  0.4 mg Oral Daily   Continuous Infusions: . cefTRIAXone (ROCEPHIN)  IV Stopped (12/26/17 2002)     LOS: 2 days    Time spent: 35 minutes    Hosie Poisson, MD [facility] Pager [phone]  If 7PM-7AM, please contact night-coverage [email or web address] Password [facility] 12/27/2017, 2:53 PM

## 2017-12-28 ENCOUNTER — Inpatient Hospital Stay (HOSPITAL_COMMUNITY): Payer: Medicare Other

## 2017-12-28 DIAGNOSIS — Z9889 Other specified postprocedural states: Secondary | ICD-10-CM

## 2017-12-28 LAB — BASIC METABOLIC PANEL
Anion gap: 12 (ref 5–15)
BUN: 35 mg/dL — ABNORMAL HIGH (ref 6–20)
CO2: 25 mmol/L (ref 22–32)
CREATININE: 1.25 mg/dL — AB (ref 0.61–1.24)
Calcium: 8.3 mg/dL — ABNORMAL LOW (ref 8.9–10.3)
Chloride: 99 mmol/L — ABNORMAL LOW (ref 101–111)
GFR, EST NON AFRICAN AMERICAN: 58 mL/min — AB (ref >60)
Glucose, Bld: 258 mg/dL — ABNORMAL HIGH (ref 65–99)
POTASSIUM: 4.5 mmol/L (ref 3.5–5.1)
SODIUM: 136 mmol/L (ref 135–145)

## 2017-12-28 LAB — BODY FLUID CELL COUNT WITH DIFFERENTIAL
Lymphs, Fluid: 12 %
Monocyte-Macrophage-Serous Fluid: 56 % (ref 50–90)
NEUTROPHIL FLUID: 32 % — AB (ref 0–25)
Total Nucleated Cell Count, Fluid: 1014 cu mm — ABNORMAL HIGH (ref 0–1000)

## 2017-12-28 LAB — GRAM STAIN

## 2017-12-28 LAB — PROTEIN, PLEURAL OR PERITONEAL FLUID: TOTAL PROTEIN, FLUID: 4.3 g/dL

## 2017-12-28 LAB — GLUCOSE, CAPILLARY
GLUCOSE-CAPILLARY: 228 mg/dL — AB (ref 65–99)
GLUCOSE-CAPILLARY: 197 mg/dL — AB (ref 65–99)
Glucose-Capillary: 227 mg/dL — ABNORMAL HIGH (ref 65–99)
Glucose-Capillary: 293 mg/dL — ABNORMAL HIGH (ref 65–99)

## 2017-12-28 LAB — LACTATE DEHYDROGENASE, PLEURAL OR PERITONEAL FLUID: LD, Fluid: 174 U/L — ABNORMAL HIGH (ref 3–23)

## 2017-12-28 MED ORDER — AZITHROMYCIN 250 MG PO TABS
500.0000 mg | ORAL_TABLET | Freq: Every day | ORAL | Status: DC
  Administered 2017-12-28 – 2017-12-29 (×2): 500 mg via ORAL
  Filled 2017-12-28 (×2): qty 2

## 2017-12-28 MED ORDER — INSULIN ASPART 100 UNIT/ML ~~LOC~~ SOLN
3.0000 [IU] | Freq: Three times a day (TID) | SUBCUTANEOUS | Status: DC
  Administered 2017-12-28 – 2017-12-29 (×5): 3 [IU] via SUBCUTANEOUS

## 2017-12-28 MED ORDER — LIDOCAINE HCL 1 % IJ SOLN
INTRAMUSCULAR | Status: AC
  Filled 2017-12-28: qty 20

## 2017-12-28 MED ORDER — PREDNISONE 20 MG PO TABS
60.0000 mg | ORAL_TABLET | Freq: Every day | ORAL | Status: DC
  Administered 2017-12-29: 60 mg via ORAL
  Filled 2017-12-28: qty 3

## 2017-12-28 NOTE — Progress Notes (Signed)
Inpatient Diabetes Program Recommendations  AACE/ADA: New Consensus Statement on Inpatient Glycemic Control (2015)  Target Ranges:  Prepandial:   less than 140 mg/dL      Peak postprandial:   less than 180 mg/dL (1-2 hours)      Critically ill patients:  140 - 180 mg/dL   Lab Results  Component Value Date   GLUCAP 267 (H) 12/27/2017   HGBA1C 7.3 (H) 12/08/2017    Review of Glycemic ControlResults for Neil Carroll, Neil Carroll. (MRN [id number]) as of 12/28/2017 11:09  Ref. Range 12/26/2017 21:13 12/27/2017 07:39 12/27/2017 11:23 12/27/2017 16:24 12/27/2017 21:27  Glucose-Capillary Latest Ref Range: 65 - 99 mg/dL 338 (H) 288 (H) 241 (H) 352 (H) 267 (H)    Diabetes history: Type 2 DM Outpatient Diabetes medications: Lantus 30 units q HS, Victoza 1.8 mg daily, Metformin 1000 mg bid Current orders for Inpatient glycemic control:  Novolog resistant tid with meals, Lantus 30 units q HS, Novolog 3 units tid with meals Solumedrol 40 mg IV q 12 hours Inpatient Diabetes Program Recommendations:    Please consider increasing Lantus to 36 units daily while on steroids. Also consider increasing Novolog meal coverage to 6 units tid with meals.  May need q 4 hour Novolog correction as well, while on steroids.   Thanks,  Adah Perl, RN, BC-ADM Inpatient Diabetes Coordinator Pager [phone] (8a-5p)

## 2017-12-28 NOTE — Care Management Important Message (Signed)
Important Message  Patient Details  Name: Neil HICKS Sr. MRN: [id number] Date of Birth: [birth year]   Medicare Important Message Given:  Yes    Kerin Salen 12/28/2017, 12:37 [place] Message  Patient Details  Name: Neil POOLE Sr. MRN: [id number] Date of Birth: February 25, 1951   Medicare Important Message Given:  Yes    Kerin Salen 12/28/2017, 12:37 PM

## 2017-12-28 NOTE — Procedures (Signed)
Ultrasound-guided diagnostic and therapeutic right thoracentesis performed yielding 1.1 liters of hazy, yellow fluid. No immediate complications. Follow-up chest x-ray pending. The fluid was sent to the lab for preordered studies.

## 2017-12-28 NOTE — Progress Notes (Signed)
   12/28/17 1000  Clinical Encounter Type  Visited With Patient  Visit Type Initial;Psychological support;Spiritual support  Referral From Nurse  Consult/Referral To Chaplain  Spiritual Encounters  Spiritual Needs Emotional;Other (Comment) (Spiritual Care Conversation)  Stress Factors  Patient Stress Factors Other (Comment) (Advance Directive )  Advance Directives (For Healthcare)  Does Patient Have a Medical Advance Directive? No  Would patient like information on creating a medical advance directive? No - Patient declined   I visited with the patient per [place] for an Advance Directive. The patient stated that he does not want to complete at this time. The patient stated that he wants his next-of-kin, his daughter, to make his decisions and that she already knows his wishes.   Please, contact Spiritual Care for further assistance.   Chaplain Shanon Ace M.Div., [facility]

## 2017-12-28 NOTE — Progress Notes (Signed)
PROGRESS NOTE    Neil Carroll  GBT:[id number] DOB: [birth year] DOA: 12/24/2017 PCP: Hoyt Koch, MD    Brief Narrative:  67 y.o.malewith medical history significant ofchronic diastolic heart failure, chronic respiratory failure with ILD and COPD, lung Ca oxygen dependent at 4L, CAD, chronic A. fib psoriasis and venous insufficiency and a Heavy smoker admitted on 12/24/2017 with worsening respiratory status and acute on chronic hypoxic respiratory failure.     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  CT chest shows possible radiation pneumonitis, with new large right pleural effusion.  Continues to have coughing and dyspnea on ambulation.  Ordered US thoracentesis.  Continue with pulmicort/brovana and duonebs.  IV antibiotics for bronchitis.  Thoracentesis done and fluid analysis sent for analysis. Pt reports breathing has improved after thoracentesis.  Plan to taper the steroids.     Mild acute on Chronic diastolic heart failure:  Worsening pedal edema and DOE.  Started on IV lasix BID, at 80 mg.  The intake and output is not reflective. Will need strict intake and output and daily weights.  Resume  metoprolol and cozaar.  Check renal parameters and electrolytes while on IV lasix.   Diabetes mellitus:  CBG (last 3)  Recent Labs    12/28/17 0740 12/28/17 1228 12/28/17 1643  GLUCAP 228* 197* 227*   Uncontrolled with hyperglycemia secondary to steroids.  Added novolog 3 units TIDAC, in addition to SSI and lantus.    Chronic atrial fibrillation;  Rate controlled and is on xarelto for anti coagulation.  On cardizem 180 mg daily for rate control.   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 Earlie Server as an outpatient   Tobacco abuse:  On nicotine patch. Counseling given.    Stage 3 CKD:  Last BMP from 4/6. Creatinine at baseline.     DVT prophylaxis: lovenox.  Code Status:full code.  Family Communication: none at bedside.  Disposition Plan:possible home in 1 to 2 days.   Consultants:   None.    Procedures:   Antimicrobials:    Subjective: Breathing has improved after thoracentesis.   Objective: Vitals:   12/28/17 1000 12/28/17 1128 12/28/17 1146 12/28/17 1525  BP:  103/75 104/71 105/73  Pulse:    70  Resp:    16  Temp:    97.7 F (36.5 C)  TempSrc:    Oral  SpO2:    98%  Weight: 122.5 kg (270 lb 1 oz)     Height:        Intake/Output Summary (Last 24 hours) at 12/28/2017 1958 Last data filed at 12/28/2017 1700 Gross per 24 hour  Intake 550 ml  Output 2405 ml  Net -1855 ml  Filed Weights   12/26/17 0700 12/28/17 0500 12/28/17 1000  Weight: 124.1 kg (273 lb 11.2 oz) 123.8 kg (273 lb) 122.5 kg (270 lb 1 oz)    Examination:  General exam: good spirits Respiratory system: bilateral scattered wheezing heard.  Cardiovascular system: S1 & S2 heard, RRR. No JVD,  2+ pedal edema. Gastrointestinal system: Abdomen is nondistended, soft and nontender. No organomegaly or masses felt. Normal bowel sounds heard. Central nervous system: Alert and oriented. No focal neurological deficits. Extremities: chronic venous  stasis, hyperpigmentation changes.  Skin: see above.  Psychiatry: Judgement and insight appear normal. Mood & affect appropriate.     Data Reviewed: I have personally reviewed following labs and imaging studies  CBC: Recent Labs  Lab 12/24/17 1546 12/25/17 0342  WBC 8.5 8.4  NEUTROABS 6.1  --   HGB 16.6 15.7  HCT 50.2 47.7  MCV 93.8 93.7  PLT 155 301   Basic Metabolic Panel: Recent Labs  Lab 12/24/17 1546 12/25/17 0342 12/27/17 1522 12/28/17 0407  NA 142 139 137 136  K 4.3 4.5 4.3 4.5  CL 104 103 97* 99*  CO2 26 25 28 25   GLUCOSE 137* 162* 338* 258*  BUN 20 22* 38* 35*  CREATININE 1.23 1.21 1.38* 1.25*  CALCIUM 9.2 8.9 8.6* 8.3*   GFR: Estimated Creatinine Clearance: 76.4 mL/min (A) (by C-G formula based on SCr of 1.25 mg/dL (H)). Liver Function Tests: Recent Labs  Lab 12/24/17 1546 12/25/17 0342  AST 21 21  ALT 22 25  ALKPHOS 57 50  BILITOT 0.7 0.5  PROT 7.8 7.3  ALBUMIN 3.6 3.3*   No results for input(s): LIPASE, AMYLASE in the last 168 hours. No results for input(s): AMMONIA in the last 168 hours. Coagulation Profile: No results for input(s): INR, PROTIME in the last 168 hours. Cardiac Enzymes: Recent Labs  Lab 12/24/17 1546  TROPONINI <0.03   BNP (last 3 results) Recent Labs    10/13/17 1202 10/21/17 1203  PROBNP 992* 962*   HbA1C: No results for input(s): HGBA1C in the last 72 hours. CBG: Recent Labs  Lab 12/27/17 1624 12/27/17 2127 12/28/17 0740 12/28/17 1228 12/28/17 1643  GLUCAP 352* 267* 228* 197* 227*   Lipid Profile: No results for input(s): CHOL, HDL, LDLCALC, TRIG, CHOLHDL, LDLDIRECT in the last 72 hours. Thyroid Function Tests: No results for input(s): TSH, T4TOTAL, FREET4, T3FREE, THYROIDAB in the last 72 hours. Anemia Panel: No results for input(s): VITAMINB12, FOLATE, FERRITIN, TIBC, IRON, RETICCTPCT in the last 72 hours. Sepsis Labs: Recent Labs  Lab 12/24/17 1748  PROCALCITON <0.10    Recent Results (from  the past 240 hour(s))  Blood culture (routine x 2)     Status: None (Preliminary result)   Collection Time: 12/24/17  3:46 PM  Result Value Ref Range Status   Specimen Description   Final    BLOOD LEFT FOREARM Performed at [facility], [place] [address] [place], [place] [address]    Special Requests   Final    BOTTLES DRAWN AEROBIC AND ANAEROBIC Blood Culture adequate volume Performed at [facility] [address], [place], [place] [address]    Culture   Final    NO GROWTH 4 DAYS Performed at [facility], [place] [address] [place], [place] [address]    Report Status PENDING  Incomplete  Blood culture (routine x 2)     Status: None (Preliminary result)   Collection Time: 12/24/17  3:46 PM  Result Value Ref  Range Status   Specimen Description   Final    BLOOD BLOOD RIGHT FOREARM Performed at [facility] [address] [place], [place] [address]    Special Requests   Final    BOTTLES DRAWN AEROBIC AND ANAEROBIC Blood Culture adequate volume Performed at [facility] [address], [place], [place] [address]    Culture   Final    NO GROWTH 4 DAYS Performed at [facility], [place] [address] [place], [place] [address]    Report Status PENDING  Incomplete  Gram stain     Status: None   Collection Time: 12/28/17  1:30 PM  Result Value Ref Range Status   Specimen Description PLEURAL RIGHT  Final   Special Requests NONE  Final   Gram Stain   Final    WBC PRESENT,BOTH PMN AND MONONUCLEAR NO ORGANISMS SEEN CYTOSPIN SMEAR Performed at [facility], [address], [place], [place] [address]    Report Status 12/28/2017 FINAL  Final         Radiology Studies: Dg Chest 1 View  Result Date: 12/28/2017 CLINICAL DATA:  Status post right-sided thoracentesis. EXAM: CHEST  1 VIEW COMPARISON:  Radiograph and CT scan December 24, 2017. FINDINGS: Stable cardiomediastinal silhouette. No  pneumothorax is noted. No significant pleural effusion is noted currently. Stable interstitial densities are noted throughout both lungs most consistent with chronic interstitial lung disease or scarring. Stable opacity seen laterally in right upper lobe as described on prior CT scan. Bony thorax is unremarkable. IMPRESSION: No pneumothorax is noted status post right-sided thoracentesis. No significant pleural effusion is noted. Electronically Signed   By: Marijo Conception, M.D.   On: 12/28/2017 12:53   US Thoracentesis Asp Pleural Space W/img Guide  Result Date: 12/28/2017 INDICATION: Patient with history of CHF/coronary artery disease, COPD, lung cancer, right pleural effusion. Request made for diagnostic and therapeutic right thoracentesis. EXAM: ULTRASOUND GUIDED DIAGNOSTIC AND THERAPEUTIC RIGHT THORACENTESIS MEDICATIONS: None COMPLICATIONS: None immediate. PROCEDURE: An ultrasound guided thoracentesis was thoroughly discussed with the patient and questions answered. The benefits, risks, alternatives and complications were also discussed. The patient understands and wishes to proceed with the procedure. Written consent was obtained. Ultrasound was performed to localize and mark an adequate pocket of fluid in the right chest. The area was then prepped and draped in the normal sterile fashion. 1% Lidocaine was used for local anesthesia. Under ultrasound guidance a 6 Fr Safe-T-Centesis catheter was introduced. Thoracentesis was performed. The catheter was removed and a dressing applied. FINDINGS: A total of approximately 1.1 liters of hazy, yellow fluid was removed. Samples were sent to the laboratory as requested by the clinical team. IMPRESSION: Successful ultrasound guided diagnostic and therapeutic right thoracentesis yielding 1.1 liters of pleural fluid. Read by: Rowe Robert, PA-C Electronically Signed   By: Jacqulynn Cadet M.D.   On: 12/28/2017 12:10        Scheduled Meds: . arformoterol  15 mcg  Nebulization BID  . azithromycin  500 mg Oral Daily  . budesonide (PULMICORT) nebulizer solution  0.5 mg Nebulization BID  . diltiazem  180 mg Oral BID  . furosemide  80 mg Intravenous Q12H  . guaiFENesin  600 mg Oral BID  . insulin aspart  0-20 Units Subcutaneous TID WC  . insulin aspart  3 Units Subcutaneous TID WC  . insulin glargine  30 Units Subcutaneous Q2200  . ipratropium-albuterol  3 mL Nebulization TID  . lidocaine      .  liraglutide  1.8 mg Subcutaneous Daily  . losartan  25 mg Oral Daily  . methylPREDNISolone (SOLU-MEDROL) injection  40 mg Intravenous Q12H  . metoprolol tartrate  100 mg Oral Daily  . metoprolol tartrate  150 mg Oral QHS  . nicotine  21 mg Transdermal Daily  . Nintedanib  150 mg Oral BID WC  . potassium chloride SA  20 mEq Oral BID  . rivaroxaban  20 mg Oral QAC lunch  . rosuvastatin  10 mg Oral Daily  . tamsulosin  0.4 mg Oral Daily   Continuous Infusions:    LOS: 3 days    Time spent: 35 minutes    Hosie Poisson, MD [facility] Pager [phone]  If 7PM-7AM, please contact night-coverage [email or web address] Password [facility] 12/28/2017, 7:58 PM

## 2017-12-29 LAB — BASIC METABOLIC PANEL
ANION GAP: 12 (ref 5–15)
BUN: 34 mg/dL — AB (ref 6–20)
CO2: 28 mmol/L (ref 22–32)
CREATININE: 1.12 mg/dL (ref 0.61–1.24)
Calcium: 8.4 mg/dL — ABNORMAL LOW (ref 8.9–10.3)
Chloride: 97 mmol/L — ABNORMAL LOW (ref 101–111)
GFR calc non Af Amer: >60 mL/min (ref >60)
Glucose, Bld: 297 mg/dL — ABNORMAL HIGH (ref 65–99)
POTASSIUM: 4.7 mmol/L (ref 3.5–5.1)
Sodium: 137 mmol/L (ref 135–145)

## 2017-12-29 LAB — CULTURE, BLOOD (ROUTINE X 2)
Culture: NO GROWTH
Culture: NO GROWTH
SPECIAL REQUESTS: ADEQUATE
SPECIAL REQUESTS: ADEQUATE

## 2017-12-29 LAB — GLUCOSE, CAPILLARY
GLUCOSE-CAPILLARY: 219 mg/dL — AB (ref 65–99)
Glucose-Capillary: 258 mg/dL — ABNORMAL HIGH (ref 65–99)

## 2017-12-29 MED ORDER — PREDNISONE 20 MG PO TABS: ORAL_TABLET | ORAL | 0 refills | Status: DC

## 2017-12-29 MED ORDER — IPRATROPIUM-ALBUTEROL 0.5-2.5 (3) MG/3ML IN SOLN: 3.0000 mL | RESPIRATORY_TRACT | 2 refills | Status: AC | PRN

## 2017-12-29 MED ORDER — SENNOSIDES-DOCUSATE SODIUM 8.6-50 MG PO TABS: 1.0000 | ORAL_TABLET | Freq: Every evening | ORAL | 0 refills | Status: AC | PRN

## 2017-12-29 MED ORDER — NICOTINE 21 MG/24HR TD PT24: 21.0000 mg | MEDICATED_PATCH | Freq: Every day | TRANSDERMAL | 0 refills | Status: AC

## 2017-12-29 MED ORDER — GUAIFENESIN ER 600 MG PO TB12: 600.0000 mg | ORAL_TABLET | Freq: Two times a day (BID) | ORAL | 0 refills | Status: AC

## 2017-12-29 NOTE — Progress Notes (Signed)
Inpatient Diabetes Program Recommendations  AACE/ADA: New Consensus Statement on Inpatient Glycemic Control (2015)  Target Ranges:  Prepandial:   less than 140 mg/dL      Peak postprandial:   less than 180 mg/dL (1-2 hours)      Critically ill patients:  140 - 180 mg/dL   Results for Neil Carroll, Neil SR. (MRN [id number]) as of 12/29/2017 09:49  Ref. Range 12/28/2017 07:40 12/28/2017 12:28 12/28/2017 16:43 12/28/2017 22:01  Glucose-Capillary Latest Ref Range: 65 - 99 mg/dL 228 (H)  10 units NOVOLOG  197 (H)  7 units NOVOLOG  227 (H)  10 units NOVOLOG  293 (H)  30 unist LANTUS   Results for [name], [name] (MRN [id number]) as of 12/29/2017 09:49  Ref. Range 12/29/2017 07:42  Glucose-Capillary Latest Ref Range: 65 - 99 mg/dL 258 (H)  14 units NOVOLOG     Home DM Meds: Lantus 30 units QHS       Victoza 1.8 mg daily       Metformin 1000 mg BID  Current Insulin Orders: Lantus 30 units QHS      Novolog Resistant Correction Scale/ SSI (0-20 units) TID AC      Novolog 3 units TID wit meals      Victoza 1.8 mg daily       Note last dose Solumedrol given yesterday at 5pm.  Now getting Prednisone 60 mg daily.  Patient not receiving Victoza as the hospital does not carry this medication and pt does not have home supply.   MD- Please consider the following in-hospital insulin adjustments while patient remains on steroids in hospital:  1. Increase Lantus slightly to 35 units QHS  2. Increase Novolog Meal Coverage to: Novolog 6 units TID with meals (hold if pt eats <50% of meal)      --Will follow patient during hospitalization--  Wyn Quaker RN, MSN, CDE Diabetes Coordinator Inpatient Glycemic Control Team Team Pager: [phone] (8a-5p)

## 2017-12-29 NOTE — Progress Notes (Signed)
Pt d/c to home. Prescripions, reasons to return to MD/ED, follow up appts. And after visit care reviewed with patient. Understanding confirmed using teachback method. PIV removed with catheter intact and no complications despite pt taking Xarelto/blood thinner. Pt to be escorted off of unit via wheelchair to care and car of friend in main lobby entrance/exit.

## 2017-12-30 ENCOUNTER — Telehealth: Payer: Self-pay | Admitting: *Deleted

## 2017-12-30 ENCOUNTER — Other Ambulatory Visit: Payer: Self-pay | Admitting: Cardiology

## 2017-12-30 NOTE — Telephone Encounter (Signed)
Transition Care Management Follow-up Telephone Call   Date discharged? 12/29/17   How have you been since you were released from the hospital? Pt states he is doing fine   Do you understand why you were in the hospital? YES   Do you understand the discharge instructions? YES   Where were you discharged to? Home   Items Reviewed:  Medications reviewed: YES  Allergies reviewed: YES  Dietary changes reviewed: YES  Referrals reviewed: YES, he states have appt w/pulmonoogist on 01/05/18   Functional Questionnaire:   Activities of Daily Living (ADLs):   He states he are independent in the following: bathing and hygiene, feeding, continence, grooming, toileting and dressing States they require assistance with the following: ambulation   Any transportation issues/concerns?: NO   Any patient concerns? NO   Confirmed importance and date/time of follow-up visits scheduled YES, appt 01/04/18  Provider Appointment booked with Dr. Sharlet Salina  Confirmed with patient if condition begins to worsen call PCP or go to the ER.  Patient was given the office number and encouraged to call back with question or concerns.  : YES

## 2017-12-31 NOTE — Discharge Summary (Signed)
Physician Discharge Summary  Neil Carroll TDD:[id number] DOB: [birth year] DOA: 12/24/2017  PCP: Hoyt Koch, MD  Admit date: 12/24/2017 Discharge date: 12/29/2017  Admitted From: Home.  Disposition: Home  Recommendations for Outpatient Follow-up:  1. Follow up with PCP in 1-2 weeks 2. Please obtain BMP/CBC in one week 3. Please follow up with pulmonology in one week.  4. Please follow up with oncology as recommended.      Discharge Condition:stable.  CODE STATUS: full code.  Diet recommendation: Heart Healthy  Brief/Interim Summary: 67 y.o.malewith medical history significant ofchronic diastolic heart failure, chronic respiratory failure with ILD and COPD, lung Ca oxygen dependent at 4L, CAD, chronic A. fib psoriasis and venous insufficiency and a Heavy smoker admitted on 12/24/2017 with worsening respiratory status and acute on chronic hypoxic respiratory failure.     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  CT chest shows possible radiation pneumonitis, with new large right pleural effusion.  Continues to have coughing and dyspnea on ambulation, hence US thoracentesis after which he felt better. Taper  steroids on discharge.  Continue with pulmicort/brovana and duonebs.  Recommend follow up with pulmonology on discharge to follow up with pleural fluid cultures.    Mild acute on Chronic diastolic heart failure:  Worsening pedal edema and DOE.  Started on IV lasix BID, at 80 mg with appropriate diuresis and clinical improvement. Changed to oral lasix on discharge.  The intake and output is not reflective. Resume metoprolol and cozaar.  Creatinine at baseline on lasix.   Diabetes mellitus:  Uncontrolled with hyperglycemia secondary to steroids.  Adjusted lantus.    Chronic atrial fibrillation;  Rate controlled and is on xarelto for anti coagulation.  On cardizem 180 mg daily for rate control.   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 Earlie Server as an outpatient   Tobacco abuse:  On nicotine patch. Counseling given.    Stage 3 CKD:  Last BMP from 4/6. Creatinine at baseline.        Discharge Instructions  Discharge Instructions    Diet - low sodium heart healthy   Complete by:  As directed    Discharge instructions   Complete by:  As directed    Please follow up with PCP in one week.  Please follow up with cardiology in 1 to 2 weeks.     Allergies as of 12/29/2017      Reactions   Niacin Nausea Only   REACTION: upset stomach      Medication List    STOP taking these medications   Fluticasone-Salmeterol 100-50 MCG/DOSE Aepb Commonly known as:  ADVAIR   Insulin Detemir 100 UNIT/ML Pen Commonly known as:  LEVEMIR FLEXPEN  TAKE these medications   acetaminophen 650 MG CR tablet Commonly known as:  TYLENOL Take 1,300 mg by mouth every 8 (eight) hours as needed for pain.   CENTRUM ADULTS Tabs Take 1 tablet by mouth daily.   clobetasol 0.05 % topical foam Commonly known as:  OLUX Apply 1 application topically 2 (two) times daily as needed (skin).   collagenase ointment Commonly known as:   SANTYL Apply 1 application topically daily as needed (dermal ulcer).   diltiazem 180 MG 24 hr capsule Commonly known as:  CARTIA XT Take 1 capsule (180 mg total) by mouth 2 (two) times daily.   ENBREL SURECLICK 50 MG/ML injection Generic drug:  etanercept Inject 50 mg into the skin as directed. Inject on Sundays & Wednesdays   Fish Oil 1000 MG Caps Take 1 capsule by mouth 2 (two) times daily.   Flax Seed Oil 1000 MG Caps Take 1 capsule by mouth 2 (two) times daily.   Fluticasone-Umeclidin-Vilant 100-62.5-25 MCG/INH Aepb Commonly known as:  TRELEGY ELLIPTA Inhale 1 puff into the lungs daily.   guaiFENesin 600 MG 12 hr tablet Commonly known as:  MUCINEX Take 1 tablet (600 mg total) by mouth 2 (two) times daily.   Insulin Glargine 100 UNIT/ML Solostar Pen Commonly known as:  LANTUS SOLOSTAR Inject 30 Units into the skin daily at 10 pm.   ipratropium-albuterol 0.5-2.5 (3) MG/3ML Soln Commonly known as:  DUONEB Take 3 mLs by nebulization every 4 (four) hours as needed.   liraglutide 18 MG/3ML Sopn Commonly known as:  VICTOZA INJECT 0.3ML (=1.8MG )      SUBCUTANEOUSLY DAILY   losartan 25 MG tablet Commonly known as:  COZAAR TAKE 1 TABLET BY MOUTH ONCE DAILY   metFORMIN 1000 MG tablet Commonly known as:  GLUCOPHAGE TAKE 1 TABLET BY MOUTH TWICE DAILY WITH FOOD   metoprolol tartrate 100 MG tablet Commonly known as:  LOPRESSOR take1 tablet by mouth every morning and take 1 and 1/2 tablets by mouth every evening   mupirocin ointment 2 % Commonly known as:  BACTROBAN Apply 1 application topically 2 (two) times daily as needed (skin).   nicotine 21 mg/24hr patch Commonly known as:  NICODERM CQ - dosed in mg/24 hours Place 1 patch (21 mg total) onto the skin daily.   Nintedanib 150 MG Caps Commonly known as:  OFEV Take 1 capsule (150 mg total) by mouth 2 (two) times daily.   NOVOFINE 32G X 6 MM Misc Generic drug:  Insulin Pen Needle use twice a day   ONETOUCH DELICA  LANCETS FINE Misc TEST BLOOD SUGAR DAILY   potassium chloride SA 20 MEQ tablet Commonly known as:  K-DUR,KLOR-CON take 1 tablet by mouth twice a day   predniSONE 20 MG tablet Commonly known as:  DELTASONE Prednisone 60 mg daily for 2 days followed by  Prednisone 40 mg daily for 3 days followed by  Prednisone 20 mg daily for 3 days .   rivaroxaban 20 MG Tabs tablet Commonly known as:  XARELTO Take 20 mg by mouth every morning.   rosuvastatin 10 MG tablet Commonly known as:  CRESTOR Take 1 tablet (10 mg total) by mouth daily.   senna-docusate 8.6-50 MG tablet Commonly known as:  Senokot-S Take 1 tablet by mouth at bedtime as needed for mild constipation.   tamsulosin 0.4 MG Caps capsule Commonly known as:  FLOMAX Take 0.4 mg by mouth daily.   triamcinolone cream 0.1 % Commonly known as:  KENALOG Apply 1 application topically 2 (  two) times daily as needed (affected area/skin).       Allergies  Allergen Reactions  . Niacin Nausea Only    REACTION: upset stomach    Consultations:  IR   Procedures/Studies: Dg Chest 1 View  Result Date: 12/28/2017 CLINICAL DATA:  Status post right-sided thoracentesis. EXAM: CHEST  1 VIEW COMPARISON:  Radiograph and CT scan December 24, 2017. FINDINGS: Stable cardiomediastinal silhouette. No pneumothorax is noted. No significant pleural effusion is noted currently. Stable interstitial densities are noted throughout both lungs most consistent with chronic interstitial lung disease or scarring. Stable opacity seen laterally in right upper lobe as described on prior CT scan. Bony thorax is unremarkable. IMPRESSION: No pneumothorax is noted status post right-sided thoracentesis. No significant pleural effusion is noted. Electronically Signed   By: Marijo Conception, M.D.   On: 12/28/2017 12:53   Dg Chest 2 View  Result Date: 12/24/2017 CLINICAL DATA:  Shortness of breath and lower extremity swelling for 3 weeks. EXAM: CHEST - 2 VIEW COMPARISON:   10/15/2017 FINDINGS: Interval progression of pleuroparenchymal opacity in the right lateral mid hemithorax. Diffuse underlying interstitial lung disease again noted. Cardiopericardial silhouette is at upper limits of normal for size. The visualized bony structures of the thorax are intact. Telemetry leads overlie the chest. IMPRESSION: Interval progression of lateral pleuroparenchymal opacity in the right mid lung. CT chest may be warranted to further evaluate. Underlying changes of chronic interstitial lung disease. Electronically Signed   By: Misty Stanley M.D.   On: 12/24/2017 15:08   Ct Chest High Resolution  Result Date: 12/25/2017 CLINICAL DATA:  Lung cancer, interstitial lung disease EXAM: CT CHEST WITHOUT CONTRAST TECHNIQUE: Multidetector CT imaging of the chest was performed following the standard protocol without intravenous contrast. High resolution imaging of the lungs, as well as inspiratory and expiratory imaging, was performed. COMPARISON:  Chest CT from 07/07/2017 FINDINGS: Cardiovascular: Coronary, aortic arch, and branch vessel atherosclerotic vascular disease. Mild cardiomegaly. Enlarged main pulmonary artery. Mediastinum/Nodes: Prevascular lymph node 1.1 cm in short axis on image 36/2, formerly 0.9 cm. AP window lymph node 0.9 cm in short axis on image 44/2, formerly 0.7 cm. Partially calcified right lower paratracheal lymph node 1.5 cm in short axis on image 53/2, formerly 1.3 cm by my measurements. Suspected right hilar adenopathy but difficult to measure due to the lack of IV contrast and difficulty separating lymph nodes from vessels in the hilum. Lungs/Pleura: Large right pleural effusion, nonspecific for transudative or exudative etiology, but with some suspected pleural thickening superiorly on the right side for example on image 24/2. This would tend to favor an exudative etiology. There also appear to be potential loculated components laterally and along the fissures. Severe bilateral  emphysema. Coarse interstitial accentuation peripherally in the right upper lobe with associated bandlike airspace opacities which are significantly increased compared to prior. The appearance could represent radiation pneumonitis if the patient is received right upper lobe radiation therapy, versus pneumonia. Progressive tumor not excluded. Peripheral interstitial accentuation in the left lung is relatively coarse and potentially with early honeycombing, and favors the lung base. No obvious air trapping on expiratory images. Upper Abdomen: No adrenal mass identified. Musculoskeletal: Unremarkable IMPRESSION: 1. Worsening peripheral airspace opacities in the right lung, especially the right upper lobe, probably from radiation pneumonitis, less likely pneumonia or progressive lung cancer. Airspace an underlying neoplastic lesion opacity is not hidden by the surrounding readily excluded. There is surrounding coarse interstitial accentuation and again possibly from radiation fibrosis/radiation pneumonitis. 2. This  is superimposed upon a background of severe emphysema and coarse peripheral interstitial accentuation favoring the lung bases likely from usual interstitial pneumonia (UIP). 3. New large right pleural effusion with some loculated components. There some areas of pleural thickening which could be from tumor or inflammation on the right. Mild mediastinal adenopathy, increased from prior. 4. Aortic Atherosclerosis (ICD10-I70.0) and Emphysema (ICD10-J43.9). Coronary atherosclerosis. Prominent main pulmonary artery likely from secondary pulmonary arterial hypertension. Electronically Signed   By: Van Clines M.D.   On: 12/25/2017 08:22   US Thoracentesis Asp Pleural Space W/img Guide  Result Date: 12/28/2017 INDICATION: Patient with history of CHF/coronary artery disease, COPD, lung cancer, right pleural effusion. Request made for diagnostic and therapeutic right thoracentesis. EXAM: ULTRASOUND GUIDED  DIAGNOSTIC AND THERAPEUTIC RIGHT THORACENTESIS MEDICATIONS: None COMPLICATIONS: None immediate. PROCEDURE: An ultrasound guided thoracentesis was thoroughly discussed with the patient and questions answered. The benefits, risks, alternatives and complications were also discussed. The patient understands and wishes to proceed with the procedure. Written consent was obtained. Ultrasound was performed to localize and mark an adequate pocket of fluid in the right chest. The area was then prepped and draped in the normal sterile fashion. 1% Lidocaine was used for local anesthesia. Under ultrasound guidance a 6 Fr Safe-T-Centesis catheter was introduced. Thoracentesis was performed. The catheter was removed and a dressing applied. FINDINGS: A total of approximately 1.1 liters of hazy, yellow fluid was removed. Samples were sent to the laboratory as requested by the clinical team. IMPRESSION: Successful ultrasound guided diagnostic and therapeutic right thoracentesis yielding 1.1 liters of pleural fluid. Read by: Rowe Robert, PA-C Electronically Signed   By: Jacqulynn Cadet M.D.   On: 12/28/2017 12:10       Subjective: No chest pain, sob, or cough.   Discharge Exam: Vitals:   12/29/17 0454 12/29/17 0852  BP: 108/80   Pulse: 92 91  Resp: 18 17  Temp: 98.1 F (36.7 C)   SpO2: 98% 97%   Vitals:   12/28/17 2050 12/28/17 2052 12/29/17 0454 12/29/17 0852  BP:   108/80   Pulse:   92 91  Resp:   18 17  Temp:   98.1 F (36.7 C)   TempSrc:   Oral   SpO2: 97% 97% 98% 97%  Weight:   121.8 kg (268 lb 8.3 oz)   Height:        General: Pt is alert, awake, not in acute distress Cardiovascular: RRR, S1/S2 +, no rubs, no gallops Respiratory: CTA bilaterally, no wheezing, no rhonchi Abdominal: Soft, NT, ND, bowel sounds + Extremities: no edema, no cyanosis    The results of significant diagnostics from this hospitalization (including imaging, microbiology, ancillary and laboratory) are listed below  for reference.     Microbiology: Recent Results (from the past 240 hour(s))  Blood culture (routine x 2)     Status: None   Collection Time: 12/24/17  3:46 PM  Result Value Ref Range Status   Specimen Description   Final    BLOOD LEFT FOREARM Performed at [facility], [place] [address] [place], [place] [address]    Special Requests   Final    BOTTLES DRAWN AEROBIC AND ANAEROBIC Blood Culture adequate volume Performed at [facility] [address] [place], [place] [address]    Culture   Final    NO GROWTH 5 DAYS Performed at [facility], [place] [address], [place],  [address]    Report Status 12/29/2017 FINAL  Final  Blood culture (routine x 2)     Status: None   Collection Time: 12/24/17  3:46 PM  Result Value Ref Range Status   Specimen Description   Final    BLOOD BLOOD RIGHT FOREARM Performed at [facility] [address] [place], [place] [address]    Special Requests   Final    BOTTLES DRAWN AEROBIC AND ANAEROBIC Blood Culture adequate volume Performed at [facility] [address] [place], [place] [address]    Culture   Final    NO GROWTH 5 DAYS Performed at [facility], [place] [address], [place], [place] [address]    Report Status 12/29/2017 FINAL  Final  Culture, body fluid-bottle     Status: None (Preliminary result)   Collection Time: 12/28/17  1:30 PM  Result Value Ref Range Status   Specimen Description PLEURAL RIGHT  Final   Special Requests NONE  Final   Culture   Final    NO GROWTH 2 DAYS Performed at [facility], [place] [address], [place], [place] [address]    Report Status PENDING  Incomplete  Gram stain     Status: None   Collection Time: 12/28/17  1:30 PM  Result Value Ref Range Status   Specimen Description PLEURAL RIGHT  Final   Special Requests NONE  Final   Gram Stain   Final    WBC PRESENT,BOTH PMN AND MONONUCLEAR NO ORGANISMS  SEEN CYTOSPIN SMEAR Performed at [facility], [address] [place],  [address]    Report Status 12/28/2017 FINAL  Final     Labs: BNP (last 3 results) Recent Labs    12/24/17 1546  BNP 177.9*   Basic Metabolic Panel: Recent Labs  Lab 12/24/17 1546 12/25/17 0342 12/27/17 1522 12/28/17 0407 12/29/17 0416  NA 142 139 137 136 137  K 4.3 4.5 4.3 4.5 4.7  CL 104 103 97* 99* 97*  CO2 26 25 28 25 28   GLUCOSE 137* 162* 338* 258* 297*  BUN 20 22* 38* 35* 34*  CREATININE 1.23 1.21 1.38* 1.25* 1.12  CALCIUM 9.2 8.9 8.6* 8.3* 8.4*   Liver Function Tests: Recent Labs  Lab 12/24/17 1546 12/25/17 0342  AST 21 21  ALT 22 25  ALKPHOS 57 50  BILITOT 0.7 0.5  PROT 7.8 7.3  ALBUMIN 3.6 3.3*   No results for input(s): LIPASE, AMYLASE in the last 168 hours. No results for input(s): AMMONIA in the last 168 hours. CBC: Recent Labs  Lab 12/24/17 1546 12/25/17 0342  WBC 8.5 8.4  NEUTROABS 6.1  --   HGB 16.6 15.7  HCT 50.2 47.7  MCV 93.8 93.7  PLT 155 153   Cardiac Enzymes: Recent Labs  Lab 12/24/17 1546  TROPONINI <0.03   BNP: Invalid input(s): POCBNP CBG: Recent Labs  Lab 12/28/17 1228 12/28/17 1643 12/28/17 2201 12/29/17 0742 12/29/17 1153  GLUCAP 197* 227* 293* 258* 219*   D-Dimer No results for input(s): DDIMER in the last 72 hours. Hgb A1c No results for input(s): HGBA1C in the last 72 hours. Lipid Profile No results for input(s): CHOL, HDL, LDLCALC, TRIG, CHOLHDL, LDLDIRECT in the last 72 hours. Thyroid function studies No results for input(s): TSH, T4TOTAL, T3FREE, THYROIDAB in the last 72 hours.  Invalid input(s): FREET3 Anemia work up No results for input(s): VITAMINB12, FOLATE, FERRITIN, TIBC, IRON, RETICCTPCT in the last 72 hours. Urinalysis    Component Value Date/Time   COLORURINE YELLOW 05/25/2017 1158  APPEARANCEUR CLEAR 05/25/2017 1158   LABSPEC 1.010 05/25/2017 1158   PHURINE 6.0 05/25/2017 1158   GLUCOSEU NEGATIVE  05/25/2017 1158   HGBUR NEGATIVE 05/25/2017 1158   BILIRUBINUR NEGATIVE 05/25/2017 1158   KETONESUR NEGATIVE 05/25/2017 1158   UROBILINOGEN 0.2 05/25/2017 1158   NITRITE NEGATIVE 05/25/2017 1158   LEUKOCYTESUR NEGATIVE 05/25/2017 1158   Sepsis Labs Invalid input(s): PROCALCITONIN,  WBC,  LACTICIDVEN Microbiology Recent Results (from the past 240 hour(s))  Blood culture (routine x 2)     Status: None   Collection Time: 12/24/17  3:46 PM  Result Value Ref Range Status   Specimen Description   Final    BLOOD LEFT FOREARM Performed at [facility], [place] [address] [place], [place] [address]    Special Requests   Final    BOTTLES DRAWN AEROBIC AND ANAEROBIC Blood Culture adequate volume Performed at [facility] [address] [place], [place] [address]    Culture   Final    NO GROWTH 5 DAYS Performed at [facility], [place] [address], [place], [place] [address]    Report Status 12/29/2017 FINAL  Final  Blood culture (routine x 2)     Status: None   Collection Time: 12/24/17  3:46 PM  Result Value Ref Range Status   Specimen Description   Final    BLOOD BLOOD RIGHT FOREARM Performed at [facility] [address] [place], [place] [address]    Special Requests   Final    BOTTLES DRAWN AEROBIC AND ANAEROBIC Blood Culture adequate volume Performed at [facility] [address] [place], [place] [address]    Culture   Final    NO GROWTH 5 DAYS Performed at [facility], [place] [address], [place], [place] [address]    Report Status 12/29/2017 FINAL  Final  Culture, body fluid-bottle     Status: None (Preliminary result)   Collection Time: 12/28/17  1:30 PM  Result Value Ref Range Status   Specimen Description PLEURAL RIGHT  Final   Special Requests NONE  Final   Culture   Final    NO GROWTH 2 DAYS Performed at [facility], [place] [address] [place], [place] [address]    Report  Status PENDING  Incomplete  Gram stain     Status: None   Collection Time: 12/28/17  1:30 PM  Result Value Ref Range Status   Specimen Description PLEURAL RIGHT  Final   Special Requests NONE  Final   Gram Stain   Final    WBC PRESENT,BOTH PMN AND MONONUCLEAR NO ORGANISMS SEEN CYTOSPIN SMEAR Performed at [facility], [address] [place], [place] [address]    Report Status 12/28/2017 FINAL  Final     Time coordinating discharge: 35 minutes  SIGNED:   Hosie Poisson, MD  [facility] 12/31/2017, 8:36 AM Pager   If 7PM-7AM, please contact night-coverage [email or web address] Password [email or web address]

## 2018-01-02 LAB — CULTURE, BODY FLUID W GRAM STAIN -BOTTLE

## 2018-01-02 LAB — CULTURE, BODY FLUID-BOTTLE: CULTURE: NO GROWTH

## 2018-01-04 ENCOUNTER — Inpatient Hospital Stay: Payer: Medicare Other | Admitting: Internal Medicine

## 2018-01-05 ENCOUNTER — Ambulatory Visit: Payer: Medicare Other

## 2018-01-05 ENCOUNTER — Ambulatory Visit (INDEPENDENT_AMBULATORY_CARE_PROVIDER_SITE_OTHER)
Admission: RE | Admit: 2018-01-05 | Discharge: 2018-01-05 | Disposition: A | Discharge status: Home / Self Care | Payer: Medicare Other | Source: Ambulatory Visit | Attending: Adult Health | Admitting: Adult Health

## 2018-01-05 ENCOUNTER — Ambulatory Visit (INDEPENDENT_AMBULATORY_CARE_PROVIDER_SITE_OTHER): Payer: Medicare Other | Admitting: Adult Health

## 2018-01-05 ENCOUNTER — Encounter: Payer: Self-pay | Admitting: Adult Health

## 2018-01-05 DIAGNOSIS — C3491 Malignant neoplasm of unspecified part of right bronchus or lung: Secondary | ICD-10-CM

## 2018-01-05 DIAGNOSIS — J84112 Idiopathic pulmonary fibrosis: Secondary | ICD-10-CM | POA: Diagnosis not present

## 2018-01-05 DIAGNOSIS — J441 Chronic obstructive pulmonary disease with (acute) exacerbation: Secondary | ICD-10-CM

## 2018-01-05 DIAGNOSIS — F17200 Nicotine dependence, unspecified, uncomplicated: Secondary | ICD-10-CM

## 2018-01-05 DIAGNOSIS — J9611 Chronic respiratory failure with hypoxia: Secondary | ICD-10-CM | POA: Diagnosis not present

## 2018-01-05 DIAGNOSIS — G4733 Obstructive sleep apnea (adult) (pediatric): Secondary | ICD-10-CM

## 2018-01-05 DIAGNOSIS — I5033 Acute on chronic diastolic (congestive) heart failure: Secondary | ICD-10-CM | POA: Diagnosis not present

## 2018-01-05 DIAGNOSIS — J90 Pleural effusion, not elsewhere classified: Secondary | ICD-10-CM | POA: Diagnosis not present

## 2018-01-05 NOTE — Assessment & Plan Note (Signed)
Smoking cessation  

## 2018-01-05 NOTE — Patient Instructions (Addendum)
Follow up with Oncology next week as planned.  Finish prednisone as directed.  Continue on Oxygen 4l/m  Consider restarting CPAP At bedtime  With oxygen .  Order for CPAP nasal mask .  Saline nasal gel As needed   Work on not smoking .  Continue on TRELEGY daily , rinse after use.  Continue on Lasix . Follow up with [name]  In 6 weeks and As needed   Please contact office for sooner follow up if symptoms do not improve or worsen or seek emergency care

## 2018-01-05 NOTE — Assessment & Plan Note (Signed)
Re-start CPAP 

## 2018-01-05 NOTE — Progress Notes (Signed)
_0  ID: Neil Carroll., male    DOB: [birth year], 67 y.o.   MRN: [id number]  Chief Complaint  Patient presents with  . 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  HRCT 01/2017 >>probable IPF, 3.0 x 1.6 x 3.4 cm mass in the periphery of the right upper lobe  PFTs 02/2017-no airway obstruction, FVC 87%, TLC 73%, DLCO 33%  01/05/2018 Follow up : COPD , O2 RF , OSA  , smoker, CHF , Post hospital follow up  Patient presents for a follow-up.  He was recently admitted last week for a COPD exacerbation, right pleural effusion, UIP exacerbation.  Patient underwent a thoracentesis on the right pleural effusion.  He was treated with aggressive pulmonary hygiene and IV steroids.  Patient did have decompensated diastolic heart failure.  He improved with diuresis.  Pleural fluid cultures were negative.  Pleural fluid cytology was positive for malignant cells-consistent with metastatic adenocarcinoma .  Patient has follow-up with oncology next week  He continues to smoke. Discussed cessation .   Since discharge patient is feeling some better. Cough is decreased. Coughing dark mucus . No hemoptysis last couple of days .   Patient has underlying ILD  remains on O FEV. No n/v  No diarrhea .   Remains on oxygen 4 L.Neil Carroll  oxgyen helps his dyspnea. Gets winded with walking long distance . Has low energy .   Has COPD on TRELEG Y.  Has stopped CPAP . Says he is not going to wear it. Wears oxygen At bedtime  .  We discussed restarting CPAP .    Allergies  Allergen Reactions  . Niacin Nausea Only    REACTION: upset stomach    Immunization History  Administered Date(s) Administered  . Influenza Whole 08/08/2010  . Influenza, High Dose Seasonal PF 06/18/2016, 06/10/2017  . Influenza-Unspecified 06/20/2013, 06/22/2014, 06/22/2015  . Pneumococcal Conjugate-13 05/29/2015  . Pneumococcal Polysaccharide-23 09/06/2013  . Tdap 04/21/2011    Past Medical History:  Diagnosis Date  . Adenomatous colon polyp   . Aortic insufficiency    Echo 3/18: Severe concentric LVH, EF 60-65, normal wall motion, moderate AI, mild LAE, mild TR // Echo 9/18: Mild concentric LVH, EF 60-65, normal wall motion, trivial AI, MAC, mild BAE  . Arthritis    left hip replacement  . Atrial flutter ([place])    onset  2011. s/p EPS/RFA 12/2011  . Cataract   . Chronic bronchitis   . Contact dermatitis and other eczema due to plants (except food)   . COPD (chronic obstructive pulmonary disease) ([place])   . GERD (gastroesophageal reflux disease)   . Hyperlipidemia   . Hypertension   . Lung cancer ([place])   . LV dysfunction    EF 45-50% 12/2011  . OSA (obstructive  sleep apnea) 10/13/2016   Mild with AHI 9.7/hr with significant oxygen desaturations as low as 76% now on CPAP  . Other peripheral vascular disease(443.89)    bilateral lower extremity  . Tobacco use disorder    dependent  . Transient global amnesia   . Tuberculosis   . Type II diabetes mellitus (HCC)     Tobacco History: Social History   Tobacco Use  Smoking Status Current Every Day Smoker  . Packs/day: 1.00  . Years: 44.00  . Pack years: 44.00  . Types: Cigarettes  Smokeless Tobacco Never Used    Tobacco Comment   started @ age 27, up to 2 ppd;1.25-1.5 ppd as of 11/23/13   Ready to quit: No Counseling given: Yes Comment: started @ age 58, up to 2 ppd;1.25-1.5 ppd as of 11/23/13   Outpatient Encounter Medications as of 01/05/2018  Medication Sig  . acetaminophen (TYLENOL) 650 MG CR tablet Take 1,300 mg by mouth every 8 (eight) hours as needed for pain.   . clobetasol (OLUX) 0.05 % topical foam Apply 1 application topically 2 (two) times daily as needed (skin).   . collagenase (SANTYL) ointment Apply 1 application topically daily as needed (dermal ulcer).   Neil Carroll diltiazem (CARTIA XT) 180 MG 24 hr capsule Take 1 capsule (180 mg total) by mouth 2 (two) times daily.  Neil Carroll etanercept (ENBREL SURECLICK) 50 MG/ML injection Inject 50 mg into the skin as directed. Inject on Sundays & Wednesdays   . Flaxseed, Linseed, (FLAX SEED OIL) 1000 MG CAPS Take 1 capsule by mouth 2 (two) times daily.   . Fluticasone-Umeclidin-Vilant (TRELEGY ELLIPTA) 100-62.5-25 MCG/INH AEPB Inhale 1 puff into the lungs daily.  . furosemide (LASIX) 80 MG tablet Take 1 tablet (80 mg total) by mouth 2 (two) times daily.  Neil Carroll guaiFENesin (MUCINEX) 600 MG 12 hr tablet Take 1 tablet (600 mg total) by mouth 2 (two) times daily.  . Insulin Glargine (LANTUS SOLOSTAR) 100 UNIT/ML Solostar Pen Inject 30 Units into the skin daily at 10 pm.  . ipratropium-albuterol (DUONEB) 0.5-2.5 (3) MG/3ML SOLN Take 3 mLs by nebulization every 4 (four) hours as needed.  . liraglutide (VICTOZA) 18 MG/3ML SOPN INJECT 0.3ML (=1.8MG)      SUBCUTANEOUSLY DAILY  . losartan (COZAAR) 25 MG tablet TAKE 1 TABLET BY MOUTH ONCE DAILY  . metFORMIN (GLUCOPHAGE) 1000 MG tablet TAKE 1 TABLET BY MOUTH TWICE DAILY WITH FOOD  . metoprolol tartrate (LOPRESSOR) 100 MG tablet take1 tablet by mouth every morning and take 1 and 1/2 tablets by mouth every evening  . Multiple Vitamins-Minerals (CENTRUM ADULTS) TABS Take 1 tablet by mouth daily.  . mupirocin ointment (BACTROBAN) 2 %  Apply 1 application topically 2 (two) times daily as needed (skin).   . nicotine (NICODERM CQ - DOSED IN MG/24 HOURS) 21 mg/24hr patch Place 1 patch (21 mg total) onto the skin daily.  . Nintedanib (OFEV) 150 MG CAPS Take 1 capsule (150 mg total) by mouth 2 (two) times daily.  Neil Carroll NOVOFINE 32G X 6 MM MISC use twice a day  . Omega-3 Fatty Acids (FISH OIL) 1000 MG CAPS Take 1 capsule by mouth 2 (two) times daily.   Glory Rosebush DELICA LANCETS FINE MISC TEST BLOOD SUGAR DAILY  . potassium chloride SA (K-DUR,KLOR-CON) 20 MEQ tablet take 1 tablet by mouth twice a day  . predniSONE (DELTASONE) 20 MG tablet Prednisone 60 mg daily for 2 days followed by  Prednisone 40 mg daily for 3 days followed by  Prednisone  20 mg daily for 3 days .  . rivaroxaban (XARELTO) 20 MG TABS tablet Take 20 mg by mouth every morning.   . rosuvastatin (CRESTOR) 10 MG tablet Take 1 tablet (10 mg total) by mouth daily.  Neil Carroll senna-docusate (SENOKOT-S) 8.6-50 MG tablet Take 1 tablet by mouth at bedtime as needed for mild constipation.  . tamsulosin (FLOMAX) 0.4 MG CAPS capsule Take 0.4 mg by mouth daily.   Neil Carroll triamcinolone cream (KENALOG) 0.1 % Apply 1 application topically 2 (two) times daily as needed (affected area/skin).    No facility-administered encounter medications on file as of 01/05/2018.      Review of Systems  Constitutional:   No  weight loss, night sweats,  Fevers, chills,  +fatigue, or  lassitude.  HEENT:   No headaches,  Difficulty swallowing,  Tooth/dental problems, or  Sore throat,                No sneezing, itching, ear ache, nasal congestion, post nasal drip,   CV:  No chest pain,  Orthopnea, PND, +swelling in lower extremities, anasarca, dizziness, palpitations, syncope.   GI  No heartburn, indigestion, abdominal pain, nausea, vomiting, diarrhea, change in bowel habits, loss of appetite, bloody stools.   Resp:   No chest wall deformity  Skin: no rash or lesions.  GU: no dysuria, change in color of  urine, no urgency or frequency.  No flank pain, no hematuria   MS:  No joint pain or swelling.  No decreased range of motion.  No back pain.    Physical Exam  BP 130/80 (BP Location: Left Arm, Cuff Size: Normal)   Pulse 78   Ht _0  (1.803 m)   Wt 267 lb (121.1 kg)   SpO2 90%   BMI 37.24 kg/m   GEN: A/Ox3; pleasant , NAD, Chronically ill appearing on O2    HEENT:  [place]/AT,  EACs-clear, TMs-wnl, NOSE-clear, THROAT-clear, no lesions, no postnasal drip or exudate noted.   NECK:  Supple w/ fair ROM; no JVD; normal carotid impulses w/o bruits; no thyromegaly or nodules palpated; no lymphadenopathy.    RESP scattered rhonchi , no accessory muscle use, no dullness to percussion  CARD:  RRR, no m/r/g, 1-2 +  peripheral edema, pulses intact, no cyanosis or clubbing. Stasis dermatic changes   GI:   Soft & nt; nml bowel sounds; no organomegaly or masses detected.   Musco: Warm bil, no deformities or joint swelling noted.   Neuro: alert, no focal deficits noted.    Skin: Warm, no lesions or rashes    Lab Results:  CBC    BNP  ProBNP  Imaging: Dg Chest 1 View  Result Date: 12/28/2017 CLINICAL DATA:  Status post right-sided thoracentesis. EXAM: CHEST  1 VIEW COMPARISON:  Radiograph and CT scan December 24, 2017. FINDINGS: Stable cardiomediastinal silhouette. No pneumothorax is noted. No significant pleural effusion is noted currently. Stable interstitial densities are noted throughout both lungs most consistent with chronic interstitial lung disease or scarring. Stable opacity seen laterally in right upper lobe as described on prior CT scan. Bony thorax is unremarkable. IMPRESSION: No pneumothorax is noted status post right-sided thoracentesis. No significant pleural effusion is noted. Electronically Signed   By: Marijo Conception, M.D.   On: 12/28/2017 12:53   Dg Chest 2 View  Result Date: 12/24/2017 CLINICAL DATA:  Shortness of breath and lower extremity swelling for 3 weeks. EXAM:  CHEST - 2 VIEW COMPARISON:  10/15/2017 FINDINGS: Interval progression of pleuroparenchymal opacity  in the right lateral mid hemithorax. Diffuse underlying interstitial lung disease again noted. Cardiopericardial silhouette is at upper limits of normal for size. The visualized bony structures of the thorax are intact. Telemetry leads overlie the chest. IMPRESSION: Interval progression of lateral pleuroparenchymal opacity in the right mid lung. CT chest may be warranted to further evaluate. Underlying changes of chronic interstitial lung disease. Electronically Signed   By: Misty Stanley M.D.   On: 12/24/2017 15:08   Ct Chest High Resolution  Result Date: 12/25/2017 CLINICAL DATA:  Lung cancer, interstitial lung disease EXAM: CT CHEST WITHOUT CONTRAST TECHNIQUE: Multidetector CT imaging of the chest was performed following the standard protocol without intravenous contrast. High resolution imaging of the lungs, as well as inspiratory and expiratory imaging, was performed. COMPARISON:  Chest CT from 07/07/2017 FINDINGS: Cardiovascular: Coronary, aortic arch, and branch vessel atherosclerotic vascular disease. Mild cardiomegaly. Enlarged main pulmonary artery. Mediastinum/Nodes: Prevascular lymph node 1.1 cm in short axis on image 36/2, formerly 0.9 cm. AP window lymph node 0.9 cm in short axis on image 44/2, formerly 0.7 cm. Partially calcified right lower paratracheal lymph node 1.5 cm in short axis on image 53/2, formerly 1.3 cm by my measurements. Suspected right hilar adenopathy but difficult to measure due to the lack of IV contrast and difficulty separating lymph nodes from vessels in the hilum. Lungs/Pleura: Large right pleural effusion, nonspecific for transudative or exudative etiology, but with some suspected pleural thickening superiorly on the right side for example on image 24/2. This would tend to favor an exudative etiology. There also appear to be potential loculated components laterally and along  the fissures. Severe bilateral emphysema. Coarse interstitial accentuation peripherally in the right upper lobe with associated bandlike airspace opacities which are significantly increased compared to prior. The appearance could represent radiation pneumonitis if the patient is received right upper lobe radiation therapy, versus pneumonia. Progressive tumor not excluded. Peripheral interstitial accentuation in the left lung is relatively coarse and potentially with early honeycombing, and favors the lung base. No obvious air trapping on expiratory images. Upper Abdomen: No adrenal mass identified. Musculoskeletal: Unremarkable IMPRESSION: 1. Worsening peripheral airspace opacities in the right lung, especially the right upper lobe, probably from radiation pneumonitis, less likely pneumonia or progressive lung cancer. Airspace an underlying neoplastic lesion opacity is not hidden by the surrounding readily excluded. There is surrounding coarse interstitial accentuation and again possibly from radiation fibrosis/radiation pneumonitis. 2. This is superimposed upon a background of severe emphysema and coarse peripheral interstitial accentuation favoring the lung bases likely from usual interstitial pneumonia (UIP). 3. New large right pleural effusion with some loculated components. There some areas of pleural thickening which could be from tumor or inflammation on the right. Mild mediastinal adenopathy, increased from prior. 4. Aortic Atherosclerosis (ICD10-I70.0) and Emphysema (ICD10-J43.9). Coronary atherosclerosis. Prominent main pulmonary artery likely from secondary pulmonary arterial hypertension. Electronically Signed   By: Van Clines M.D.   On: 12/25/2017 08:22   US Thoracentesis Asp Pleural Space W/img Guide  Result Date: 12/28/2017 INDICATION: Patient with history of CHF/coronary artery disease, COPD, lung cancer, right pleural effusion. Request made for diagnostic and therapeutic right  thoracentesis. EXAM: ULTRASOUND GUIDED DIAGNOSTIC AND THERAPEUTIC RIGHT THORACENTESIS MEDICATIONS: None COMPLICATIONS: None immediate. PROCEDURE: An ultrasound guided thoracentesis was thoroughly discussed with the patient and questions answered. The benefits, risks, alternatives and complications were also discussed. The patient understands and wishes to proceed with the procedure. Written consent was obtained. Ultrasound was performed to localize and mark an adequate pocket  of fluid in the right chest. The area was then prepped and draped in the normal sterile fashion. 1% Lidocaine was used for local anesthesia. Under ultrasound guidance a 6 Fr Safe-T-Centesis catheter was introduced. Thoracentesis was performed. The catheter was removed and a dressing applied. FINDINGS: A total of approximately 1.1 liters of hazy, yellow fluid was removed. Samples were sent to the laboratory as requested by the clinical team. IMPRESSION: Successful ultrasound guided diagnostic and therapeutic right thoracentesis yielding 1.1 liters of pleural fluid. Read by: Rowe Robert, PA-C Electronically Signed   By: Jacqulynn Cadet M.D.   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  Consistent with a malignant/metastatic pleural effusion Patient has follow-up with oncology next week discussed results with patient.   Smoker Smoking cessation  Bronchitis, chronic obstructive, with exacerbation (HCC) Recent exacerbation with possible pneumonia versus radiation pneumonitis improving on steroids.  Patient's taper steroids as discussed.  Continue on his current regimen  Acute on chronic diastolic heart failure  (HCC) Recent exacerbation now improving.  Patient continue on current regimen follow-up with cardiology  as planned     Rexene Edison, NP 01/05/2018

## 2018-01-05 NOTE — Assessment & Plan Note (Signed)
Right pleural effusion status post thoracentesis with positive malignant cells on cytology.  Consistent with a malignant/metastatic pleural effusion Patient has follow-up with oncology next week discussed results with patient.

## 2018-01-05 NOTE — Addendum Note (Signed)
Addended by: Parke Poisson E on: 01/05/2018 09:52 AM   Modules accepted: Orders

## 2018-01-05 NOTE — Assessment & Plan Note (Signed)
Recent exacerbation with possible pneumonia versus radiation pneumonitis improving on steroids.  Patient's taper steroids as discussed.  Continue on his current regimen

## 2018-01-05 NOTE — Assessment & Plan Note (Signed)
Continue on oxygen 

## 2018-01-05 NOTE — Assessment & Plan Note (Signed)
Continue on 0FEV

## 2018-01-05 NOTE — Assessment & Plan Note (Signed)
Recent exacerbation now improving.  Patient continue on current regimen follow-up with cardiology  as planned

## 2018-01-06 ENCOUNTER — Ambulatory Visit (HOSPITAL_COMMUNITY): Payer: Medicare Other

## 2018-01-06 ENCOUNTER — Other Ambulatory Visit: Payer: Medicare Other

## 2018-01-09 NOTE — Progress Notes (Signed)
Reviewed & agree with plan  

## 2018-01-10 ENCOUNTER — Inpatient Hospital Stay: Payer: Medicare Other

## 2018-01-10 ENCOUNTER — Encounter: Payer: Self-pay | Admitting: *Deleted

## 2018-01-10 ENCOUNTER — Inpatient Hospital Stay: Payer: Medicare Other | Attending: Internal Medicine | Admitting: Internal Medicine

## 2018-01-10 ENCOUNTER — Encounter: Payer: Self-pay | Admitting: Internal Medicine

## 2018-01-10 ENCOUNTER — Telehealth: Payer: Self-pay | Admitting: Internal Medicine

## 2018-01-10 DIAGNOSIS — Z9981 Dependence on supplemental oxygen: Secondary | ICD-10-CM | POA: Diagnosis not present

## 2018-01-10 DIAGNOSIS — E119 Type 2 diabetes mellitus without complications: Secondary | ICD-10-CM | POA: Diagnosis not present

## 2018-01-10 DIAGNOSIS — I700 Atherosclerosis of aorta: Secondary | ICD-10-CM | POA: Insufficient documentation

## 2018-01-10 DIAGNOSIS — C3411 Malignant neoplasm of upper lobe, right bronchus or lung: Secondary | ICD-10-CM | POA: Diagnosis not present

## 2018-01-10 DIAGNOSIS — Z7901 Long term (current) use of anticoagulants: Secondary | ICD-10-CM | POA: Diagnosis not present

## 2018-01-10 DIAGNOSIS — I739 Peripheral vascular disease, unspecified: Secondary | ICD-10-CM | POA: Insufficient documentation

## 2018-01-10 DIAGNOSIS — G4730 Sleep apnea, unspecified: Secondary | ICD-10-CM | POA: Insufficient documentation

## 2018-01-10 DIAGNOSIS — C782 Secondary malignant neoplasm of pleura: Secondary | ICD-10-CM | POA: Insufficient documentation

## 2018-01-10 DIAGNOSIS — Z86010 Personal history of colonic polyps: Secondary | ICD-10-CM | POA: Diagnosis not present

## 2018-01-10 DIAGNOSIS — I509 Heart failure, unspecified: Secondary | ICD-10-CM | POA: Insufficient documentation

## 2018-01-10 DIAGNOSIS — I4892 Unspecified atrial flutter: Secondary | ICD-10-CM

## 2018-01-10 DIAGNOSIS — C3490 Malignant neoplasm of unspecified part of unspecified bronchus or lung: Secondary | ICD-10-CM

## 2018-01-10 DIAGNOSIS — C3491 Malignant neoplasm of unspecified part of right bronchus or lung: Secondary | ICD-10-CM

## 2018-01-10 DIAGNOSIS — E785 Hyperlipidemia, unspecified: Secondary | ICD-10-CM | POA: Diagnosis not present

## 2018-01-10 DIAGNOSIS — M7989 Other specified soft tissue disorders: Secondary | ICD-10-CM

## 2018-01-10 DIAGNOSIS — J449 Chronic obstructive pulmonary disease, unspecified: Secondary | ICD-10-CM

## 2018-01-10 DIAGNOSIS — K219 Gastro-esophageal reflux disease without esophagitis: Secondary | ICD-10-CM | POA: Diagnosis not present

## 2018-01-10 DIAGNOSIS — J910 Malignant pleural effusion: Secondary | ICD-10-CM | POA: Diagnosis not present

## 2018-01-10 DIAGNOSIS — F17210 Nicotine dependence, cigarettes, uncomplicated: Secondary | ICD-10-CM | POA: Diagnosis not present

## 2018-01-10 DIAGNOSIS — Z923 Personal history of irradiation: Secondary | ICD-10-CM | POA: Diagnosis not present

## 2018-01-10 DIAGNOSIS — Z794 Long term (current) use of insulin: Secondary | ICD-10-CM | POA: Insufficient documentation

## 2018-01-10 DIAGNOSIS — J9610 Chronic respiratory failure, unspecified whether with hypoxia or hypercapnia: Secondary | ICD-10-CM

## 2018-01-10 DIAGNOSIS — R599 Enlarged lymph nodes, unspecified: Secondary | ICD-10-CM

## 2018-01-10 LAB — CBC WITH DIFFERENTIAL/PLATELET
Basophils Absolute: 0 10*3/uL (ref 0.0–0.1)
Basophils Relative: 0 %
EOS ABS: 0.2 10*3/uL (ref 0.0–0.5)
EOS PCT: 2 %
HCT: 50.2 % — ABNORMAL HIGH (ref 38.4–49.9)
Hemoglobin: 16.2 g/dL (ref 13.0–17.1)
LYMPHS ABS: 1.6 10*3/uL (ref 0.9–3.3)
LYMPHS PCT: 14 %
MCH: 29.6 pg (ref 27.2–33.4)
MCHC: 32.3 g/dL (ref 32.0–36.0)
MCV: 91.7 fL (ref 79.3–98.0)
Monocytes Absolute: 1 10*3/uL — ABNORMAL HIGH (ref 0.1–0.9)
Monocytes Relative: 9 %
Neutro Abs: 8.5 10*3/uL — ABNORMAL HIGH (ref 1.5–6.5)
Neutrophils Relative %: 75 %
PLATELETS: 143 10*3/uL (ref 140–400)
RBC: 5.48 MIL/uL (ref 4.20–5.82)
RDW: 15.3 % — ABNORMAL HIGH (ref 11.0–14.6)
WBC: 11.3 10*3/uL — AB (ref 4.0–10.3)

## 2018-01-10 LAB — COMPREHENSIVE METABOLIC PANEL
ALBUMIN: 3.3 g/dL — AB (ref 3.5–5.0)
ALT: 27 U/L (ref 0–55)
AST: 13 U/L (ref 5–34)
Alkaline Phosphatase: 51 U/L (ref 40–150)
Anion gap: 14 — ABNORMAL HIGH (ref 3–11)
BILIRUBIN TOTAL: 0.6 mg/dL (ref 0.2–1.2)
BUN: 18 mg/dL (ref 7–26)
CHLORIDE: 95 mmol/L — AB (ref 98–109)
CO2: 29 mmol/L (ref 22–29)
CREATININE: 1.16 mg/dL (ref 0.70–1.30)
Calcium: 9.3 mg/dL (ref 8.4–10.4)
GFR calc Af Amer: >60 mL/min (ref >60)
GLUCOSE: 179 mg/dL — AB (ref 70–140)
POTASSIUM: 3.2 mmol/L — AB (ref 3.5–5.1)
Sodium: 138 mmol/L (ref 136–145)
Total Protein: 6.8 g/dL (ref 6.4–8.3)

## 2018-01-10 NOTE — Telephone Encounter (Signed)
Scheduled appt per 4/22 los - Gave patient AVS and calender per los. - Per MM okay to schedule on 5/10 for f/u

## 2018-01-10 NOTE — Progress Notes (Signed)
Oncology Nurse Navigator Documentation  Oncology Nurse Navigator Flowsheets 01/10/2018  Navigator Location [facility]  Navigator Encounter Type Clinic/MDC/per Dr. Julien Nordmann I requested. I updated pathology to send foundation one and PDL 1 on recent cytology.   Patient Visit Type MedOnc  Treatment Phase Other  Barriers/Navigation Needs Coordination of Care  Interventions Coordination of Care  Coordination of Care Other  Acuity Level 2  Acuity Level 2 Other  Time Spent with Patient 30

## 2018-01-10 NOTE — Progress Notes (Signed)
[facility] [phone]   [phone]  OFFICE PROGRESS NOTE  Hoyt Koch, MD [place] [address]  DIAGNOSIS: Recurrent and metastatic non-small cell lung cancer, adenocarcinoma initially diagnosed as stage IB (T2a, N0, M0) non-small cell lung cancer, squamous cell carcinoma presented with right upper lobe pleural-based mass diagnosed in June 2018.  He has disease recurrence and April 2019 consistent with adenocarcinoma in the right pleural fluid.  PRIOR THERAPY: definitive and curative SBRT to the right upper lobe lung nodule under the care of Dr. Tammi Klippel completed on 04/05/2017.  CURRENT THERAPY: Observation.  INTERVAL HISTORY: Neil CEESAY Sr. 67 y.o. male returns to the clinic today for follow-up visit.  The patient is complaining of right-sided chest pain as well as shortness of breath at baseline increased with exertion.  He is currently on home oxygen.  He also has cough productive of yellowish sputum occasionally blood-tinged.  He also complaining of swelling of the lower extremities and he is currently on Lasix 80 mg p.o. daily.  His blood pressure has been low.  He denied having any recent weight loss or night sweats.  He has no nausea, vomiting, diarrhea or constipation.  He denied having any fever or chills.  He has no headache or visual changes.  He recently underwent ultrasound-guided right thoracentesis and the pleural fluid was positive for malignant cells consistent with adenocarcinoma.  He is here today for evaluation and discussion of his treatment options.   MEDICAL HISTORY: Past Medical History:  Diagnosis Date  . Adenomatous colon polyp   . Aortic insufficiency    Echo 3/18: Severe concentric LVH, EF 60-65, normal wall motion, moderate AI, mild LAE, mild TR // Echo 9/18: Mild concentric LVH, EF 60-65, normal wall motion, trivial AI, MAC, mild BAE  . Arthritis    left hip replacement  . Atrial  flutter ([place])    onset  2011. s/p EPS/RFA 12/2011  . Cataract   . Chronic bronchitis   . Contact dermatitis and other eczema due to plants (except food)   . COPD (chronic obstructive pulmonary disease) ([place])   . GERD (gastroesophageal reflux disease)   . Hyperlipidemia   . Hypertension   . Lung cancer ([place])   . LV dysfunction    EF 45-50% 12/2011  . OSA (obstructive sleep apnea) 10/13/2016   Mild with AHI 9.7/hr with significant oxygen desaturations as low as 76% now on CPAP  . Other peripheral vascular disease(443.89)    bilateral lower extremity  . Tobacco use disorder    dependent  . Transient global amnesia   . Tuberculosis   . Type II diabetes mellitus ([facility])     ALLERGIES:  is allergic to niacin.  MEDICATIONS:  Current Outpatient Medications  Medication Sig Dispense Refill  . acetaminophen (TYLENOL) 650 MG CR tablet Take 1,300 mg by mouth every 8 (eight) hours as needed for pain.     . clobetasol (OLUX) 0.05 % topical foam Apply 1 application topically 2 (two) times daily as needed (skin).   0  . collagenase (SANTYL) ointment Apply 1 application topically daily as needed (dermal ulcer).     [name] diltiazem (CARTIA XT) 180 MG 24 hr capsule Take 1 capsule (180 mg total) by mouth 2 (two) times daily. 180 capsule 3  . etanercept (ENBREL SURECLICK) 50 MG/ML injection Inject 50 mg into the skin as directed. Inject on Sundays & Wednesdays     . Flaxseed, Linseed, (  FLAX SEED OIL) 1000 MG CAPS Take 1 capsule by mouth 2 (two) times daily.     . Fluticasone-Umeclidin-Vilant (TRELEGY ELLIPTA) 100-62.5-25 MCG/INH AEPB Inhale 1 puff into the lungs daily. 30 each 11  . furosemide (LASIX) 80 MG tablet Take 1 tablet (80 mg total) by mouth 2 (two) times daily. 180 tablet 2  . guaiFENesin (MUCINEX) 600 MG 12 hr tablet Take 1 tablet (600 mg total) by mouth 2 (two) times daily. 20 tablet 0  . Insulin Glargine (LANTUS SOLOSTAR) 100 UNIT/ML Solostar Pen Inject 30 Units into the skin daily at 10 pm. 5 pen  1  . ipratropium-albuterol (DUONEB) 0.5-2.5 (3) MG/3ML SOLN Take 3 mLs by nebulization every 4 (four) hours as needed. 360 mL 2  . liraglutide (VICTOZA) 18 MG/3ML SOPN INJECT 0.3ML (=1.8MG)      SUBCUTANEOUSLY DAILY 27 mL 1  . losartan (COZAAR) 25 MG tablet TAKE 1 TABLET BY MOUTH ONCE DAILY 90 tablet 2  . metFORMIN (GLUCOPHAGE) 1000 MG tablet TAKE 1 TABLET BY MOUTH TWICE DAILY WITH FOOD 180 tablet 0  . metoprolol tartrate (LOPRESSOR) 100 MG tablet take1 tablet by mouth every morning and take 1 and 1/2 tablets by mouth every evening 225 tablet 3  . Multiple Vitamins-Minerals (CENTRUM ADULTS) TABS Take 1 tablet by mouth daily.    . mupirocin ointment (BACTROBAN) 2 % Apply 1 application topically 2 (two) times daily as needed (skin).     . nicotine (NICODERM CQ - DOSED IN MG/24 HOURS) 21 mg/24hr patch Place 1 patch (21 mg total) onto the skin daily. 28 patch 0  . Nintedanib (OFEV) 150 MG CAPS Take 1 capsule (150 mg total) by mouth 2 (two) times daily. 60 capsule 11  . NOVOFINE 32G X 6 MM MISC use twice a day 100 each 5  . Omega-3 Fatty Acids (FISH OIL) 1000 MG CAPS Take 1 capsule by mouth 2 (two) times daily.     Glory Rosebush DELICA LANCETS FINE MISC TEST BLOOD SUGAR DAILY 200 each 12  . potassium chloride SA (K-DUR,KLOR-CON) 20 MEQ tablet take 1 tablet by mouth twice a day 180 tablet 3  . predniSONE (DELTASONE) 20 MG tablet Prednisone 60 mg daily for 2 days followed by  Prednisone 40 mg daily for 3 days followed by  Prednisone 20 mg daily for 3 days . 15 tablet 0  . rivaroxaban (XARELTO) 20 MG TABS tablet Take 20 mg by mouth every morning.     . rosuvastatin (CRESTOR) 10 MG tablet Take 1 tablet (10 mg total) by mouth daily. 90 tablet 3  . senna-docusate (SENOKOT-S) 8.6-50 MG tablet Take 1 tablet by mouth at bedtime as needed for mild constipation. 20 tablet 0  . tamsulosin (FLOMAX) 0.4 MG CAPS capsule Take 0.4 mg by mouth daily.     [name] triamcinolone cream (KENALOG) 0.1 % Apply 1 application topically  2 (two) times daily as needed (affected area/skin).      No current facility-administered medications for this visit.     SURGICAL HISTORY:  Past Surgical History:  Procedure Laterality Date  . A FLUTTER ABLATION N/A 12/28/2011   Procedure: ABLATION A FLUTTER;  Surgeon: Evans Lance, MD;  Location: [facility] CATH LAB;  Service: Cardiovascular;  Laterality: N/A;  . CARDIAC CATHETERIZATION N/A 07/10/2015   Procedure: Left Heart Cath and Coronary Angiography;  Surgeon: Sherren Mocha, MD;  Location: [place] CV LAB;  Service: Cardiovascular;  Laterality: N/A;  . CARDIAC ELECTROPHYSIOLOGY MAPPING AND ABLATION  03/2010  . CARDIOVERSION  N/A 07/13/2016   Procedure: CARDIOVERSION;  Surgeon: Skeet Latch, MD;  Location: [place];  Service: Cardiovascular;  Laterality: N/A;  . COLONOSCOPY    . colonoscopy with polypectomy  2006 & 2011   Dr [name]  . CYSTOSCOPY  11/2007   Dr Jeffie Pollock  . PARTIAL HIP ARTHROPLASTY Right 01/2000   "Right; replaced ball & stem"  . PARTIAL HIP ARTHROPLASTY Left   . POLYPECTOMY    . TEE WITHOUT CARDIOVERSION  12/28/2011   Procedure: TRANSESOPHAGEAL ECHOCARDIOGRAM (TEE);  Surgeon: Larey Dresser, MD;  Location: [place];  Service: Cardiovascular;  Laterality: N/A;    REVIEW OF SYSTEMS:  Constitutional: positive for fatigue Eyes: negative Ears, nose, mouth, throat, and face: negative Respiratory: positive for cough, dyspnea on exertion and pleurisy/chest pain Cardiovascular: negative Gastrointestinal: negative Genitourinary:negative Integument/breast: negative Hematologic/lymphatic: negative Musculoskeletal:negative Neurological: negative Behavioral/Psych: negative Endocrine: negative Allergic/Immunologic: negative   PHYSICAL EXAMINATION: General appearance: alert, cooperative, fatigued and no distress Head: Normocephalic, without obvious abnormality, atraumatic Neck: no adenopathy, no JVD, supple, symmetrical, trachea midline and thyroid not enlarged,  symmetric, no tenderness/mass/nodules Lymph nodes: Cervical, supraclavicular, and axillary nodes normal. Resp: diminished breath sounds RLL, dullness to percussion RLL and wheezes bilaterally Back: symmetric, no curvature. ROM normal. No CVA tenderness. Cardio: regular rate and rhythm, S1, S2 normal, no murmur, click, rub or gallop GI: soft, non-tender; bowel sounds normal; no masses,  no organomegaly Extremities: extremities normal, atraumatic, no cyanosis or edema Neurologic: Alert and oriented X 3, normal strength and tone. Normal symmetric reflexes. Normal coordination and gait  ECOG PERFORMANCE STATUS: 1 - Symptomatic but completely ambulatory  Blood pressure (!) 94/54, pulse 91, temperature 97.9 F (36.6 C), temperature source Oral, resp. rate 19, height _0  (1.803 m), weight 267 lb 9.6 oz (121.4 kg), SpO2 96 %.  LABORATORY DATA: Lab Results  Component Value Date   WBC 11.3 (H) 01/10/2018   HGB 16.2 01/10/2018   HCT 50.2 (H) 01/10/2018   MCV 91.7 01/10/2018   PLT 143 01/10/2018      Chemistry      Component Value Date/Time   NA 137 12/29/2017 0416   NA 141 10/21/2017 1203   NA 140 07/07/2017 1015   K 4.7 12/29/2017 0416   K 4.1 07/07/2017 1015   CL 97 (L) 12/29/2017 0416   CO2 28 12/29/2017 0416   CO2 26 07/07/2017 1015   BUN 34 (H) 12/29/2017 0416   BUN 17 10/21/2017 1203   BUN 18.8 07/07/2017 1015   CREATININE 1.12 12/29/2017 0416   CREATININE 1.5 (H) 07/07/2017 1015   GLU 149 07/07/2013 1325      Component Value Date/Time   CALCIUM 8.4 (L) 12/29/2017 0416   CALCIUM 9.5 07/07/2017 1015   ALKPHOS 50 12/25/2017 0342   ALKPHOS 55 07/07/2017 1015   AST 21 12/25/2017 0342   AST 15 07/07/2017 1015   ALT 25 12/25/2017 0342   ALT 15 07/07/2017 1015   BILITOT 0.5 12/25/2017 0342   BILITOT 0.51 07/07/2017 1015       RADIOGRAPHIC STUDIES: Dg Chest 1 View  Result Date: 12/28/2017 CLINICAL DATA:  Status post right-sided thoracentesis. EXAM: CHEST  1 VIEW  COMPARISON:  Radiograph and CT scan December 24, 2017. FINDINGS: Stable cardiomediastinal silhouette. No pneumothorax is noted. No significant pleural effusion is noted currently. Stable interstitial densities are noted throughout both lungs most consistent with chronic interstitial lung disease or scarring. Stable opacity seen laterally in right upper lobe as described on prior CT scan. Bony thorax  is unremarkable. IMPRESSION: No pneumothorax is noted status post right-sided thoracentesis. No significant pleural effusion is noted. Electronically Signed   By: Marijo Conception, M.D.   On: 12/28/2017 12:53   Dg Chest 2 View  Result Date: 01/05/2018 CLINICAL DATA:  Follow-up pleural effusion. EXAM: CHEST - 2 VIEW COMPARISON:  Chest radiograph 12/28/2017. CT chest 12/24/2017. FINDINGS: Cardiomegaly. RIGHT pleural effusion remains improved. Stable interstitial densities throughout both lungs, RIGHT greater than LEFT. Stable opacity seen laterally in RIGHT upper lobe, described on prior CT scan. Bony thorax unremarkable. Aortic atherosclerosis. IMPRESSION: Stable chest. No pneumothorax. No new areas of consolidation or edema. Electronically Signed   By: Staci Righter M.D.   On: 01/05/2018 10:08   Dg Chest 2 View  Result Date: 12/24/2017 CLINICAL DATA:  Shortness of breath and lower extremity swelling for 3 weeks. EXAM: CHEST - 2 VIEW COMPARISON:  10/15/2017 FINDINGS: Interval progression of pleuroparenchymal opacity in the right lateral mid hemithorax. Diffuse underlying interstitial lung disease again noted. Cardiopericardial silhouette is at upper limits of normal for size. The visualized bony structures of the thorax are intact. Telemetry leads overlie the chest. IMPRESSION: Interval progression of lateral pleuroparenchymal opacity in the right mid lung. CT chest may be warranted to further evaluate. Underlying changes of chronic interstitial lung disease. Electronically Signed   By: Misty Stanley M.D.   On:  12/24/2017 15:08   Ct Chest High Resolution  Result Date: 12/25/2017 CLINICAL DATA:  Lung cancer, interstitial lung disease EXAM: CT CHEST WITHOUT CONTRAST TECHNIQUE: Multidetector CT imaging of the chest was performed following the standard protocol without intravenous contrast. High resolution imaging of the lungs, as well as inspiratory and expiratory imaging, was performed. COMPARISON:  Chest CT from 07/07/2017 FINDINGS: Cardiovascular: Coronary, aortic arch, and branch vessel atherosclerotic vascular disease. Mild cardiomegaly. Enlarged main pulmonary artery. Mediastinum/Nodes: Prevascular lymph node 1.1 cm in short axis on image 36/2, formerly 0.9 cm. AP window lymph node 0.9 cm in short axis on image 44/2, formerly 0.7 cm. Partially calcified right lower paratracheal lymph node 1.5 cm in short axis on image 53/2, formerly 1.3 cm by my measurements. Suspected right hilar adenopathy but difficult to measure due to the lack of IV contrast and difficulty separating lymph nodes from vessels in the hilum. Lungs/Pleura: Large right pleural effusion, nonspecific for transudative or exudative etiology, but with some suspected pleural thickening superiorly on the right side for example on image 24/2. This would tend to favor an exudative etiology. There also appear to be potential loculated components laterally and along the fissures. Severe bilateral emphysema. Coarse interstitial accentuation peripherally in the right upper lobe with associated bandlike airspace opacities which are significantly increased compared to prior. The appearance could represent radiation pneumonitis if the patient is received right upper lobe radiation therapy, versus pneumonia. Progressive tumor not excluded. Peripheral interstitial accentuation in the left lung is relatively coarse and potentially with early honeycombing, and favors the lung base. No obvious air trapping on expiratory images. Upper Abdomen: No adrenal mass identified.  Musculoskeletal: Unremarkable IMPRESSION: 1. Worsening peripheral airspace opacities in the right lung, especially the right upper lobe, probably from radiation pneumonitis, less likely pneumonia or progressive lung cancer. Airspace an underlying neoplastic lesion opacity is not hidden by the surrounding readily excluded. There is surrounding coarse interstitial accentuation and again possibly from radiation fibrosis/radiation pneumonitis. 2. This is superimposed upon a background of severe emphysema and coarse peripheral interstitial accentuation favoring the lung bases likely from usual interstitial pneumonia (UIP). 3.  New large right pleural effusion with some loculated components. There some areas of pleural thickening which could be from tumor or inflammation on the right. Mild mediastinal adenopathy, increased from prior. 4. Aortic Atherosclerosis (ICD10-I70.0) and Emphysema (ICD10-J43.9). Coronary atherosclerosis. Prominent main pulmonary artery likely from secondary pulmonary arterial hypertension. Electronically Signed   By: Van Clines M.D.   On: 12/25/2017 08:22   US Thoracentesis Asp Pleural Space W/img Guide  Result Date: 12/28/2017 INDICATION: Patient with history of CHF/coronary artery disease, COPD, lung cancer, right pleural effusion. Request made for diagnostic and therapeutic right thoracentesis. EXAM: ULTRASOUND GUIDED DIAGNOSTIC AND THERAPEUTIC RIGHT THORACENTESIS MEDICATIONS: None COMPLICATIONS: None immediate. PROCEDURE: An ultrasound guided thoracentesis was thoroughly discussed with the patient and questions answered. The benefits, risks, alternatives and complications were also discussed. The patient understands and wishes to proceed with the procedure. Written consent was obtained. Ultrasound was performed to localize and mark an adequate pocket of fluid in the right chest. The area was then prepped and draped in the normal sterile fashion. 1% Lidocaine was used for local  anesthesia. Under ultrasound guidance a 6 Fr Safe-T-Centesis catheter was introduced. Thoracentesis was performed. The catheter was removed and a dressing applied. FINDINGS: A total of approximately 1.1 liters of hazy, yellow fluid was removed. Samples were sent to the laboratory as requested by the clinical team. IMPRESSION: Successful ultrasound guided diagnostic and therapeutic right thoracentesis yielding 1.1 liters of pleural fluid. Read by: Rowe Robert, PA-C Electronically Signed   By: Jacqulynn Cadet M.D.   On: 12/28/2017 12:10    ASSESSMENT AND PLAN: this is a very pleasant 67 years old white male with recurrent and metastatic non-small cell lung cancer, adenocarcinoma that was initially diagnosed as stage IB (T2a, N0, M0) non-small cell lung cancer, squamous cell carcinoma status post SBRT to the right upper lobe lung mass by Dr. Tammi Klippel. The patient had recurrent right pleural effusion and ultrasound-guided thoracentesis showed malignant cells consistent with adenocarcinoma. I personally and independently reviewed the scan images and discussed the results with the patient today. I recommended for the patient to complete the staging work-up by ordering a PET scan for further evaluation of his disease and to rule out any other metastatic disease. I would request the above studies department to send his tissue block for molecular studies and PDL 1 expression by foundation 1. I will see the patient back for follow-up visit in 2-3 weeks for reevaluation and more discussion of his treatment options based on the staging work-up on molecular studies. For the recurrent right pleural effusion, the patient may need Pleurx catheter placement if he reaccumulate fluid soon. For the hypertension, he was advised to discuss with his primary care physician adjustment of his blood pressure medication and diuretics. He was advised to call immediately if he has any concerning symptoms in the interval. The  patient voices understanding of current disease status and treatment options and is in agreement with the current care plan.  All questions were answered. The patient knows to call the clinic with any problems, questions or concerns. We can certainly see the patient much sooner if necessary.  Disclaimer: This note was dictated with voice recognition software. Similar sounding words can inadvertently be transcribed and may not be corrected upon review.

## 2018-01-11 ENCOUNTER — Ambulatory Visit (INDEPENDENT_AMBULATORY_CARE_PROVIDER_SITE_OTHER): Payer: Medicare Other | Admitting: Internal Medicine

## 2018-01-11 ENCOUNTER — Encounter: Payer: Self-pay | Admitting: Internal Medicine

## 2018-01-11 ENCOUNTER — Ambulatory Visit: Payer: Medicare Other

## 2018-01-11 VITALS — BP 100/60 | HR 82 | Temp 98.2°F | Ht 71.0 in | Wt 270.0 lb

## 2018-01-11 DIAGNOSIS — E782 Mixed hyperlipidemia: Secondary | ICD-10-CM | POA: Diagnosis not present

## 2018-01-11 DIAGNOSIS — J9621 Acute and chronic respiratory failure with hypoxia: Secondary | ICD-10-CM

## 2018-01-11 DIAGNOSIS — I5033 Acute on chronic diastolic (congestive) heart failure: Secondary | ICD-10-CM | POA: Diagnosis not present

## 2018-01-11 DIAGNOSIS — L400 Psoriasis vulgaris: Secondary | ICD-10-CM | POA: Diagnosis not present

## 2018-01-11 MED ORDER — CRESTOR 10 MG PO TABS: 10.0000 mg | ORAL_TABLET | Freq: Every day | ORAL | 3 refills | Status: AC

## 2018-01-11 NOTE — Patient Instructions (Signed)
We will wait and see the PET scan shows.   We have sent in the brand name crestor for you.   [phone], this is the eye doctor's number.

## 2018-01-11 NOTE — Progress Notes (Signed)
   Subjective:    Patient ID: Neil Mam., male    DOB: 08-13-1951, 67 y.o.   MRN: [id number]  HPI The patient is a 67 YO man coming in for hospital follow up (in for SOB and had thoracentesis with fluid drained off due to malignancy in his lung). He is stable since leaving the hospital. Still feeling tired. He is some depressed about his current state and was not aware that the lung cancer could cause such problems. Denies fevers or chills. Still smoking some and using oxygen most of the time. Denies abdominal pain or diarrhea or constipation.   PMH, [facility], social history reviewed and updated  Review of Systems  Constitutional: Positive for activity change, appetite change and fatigue. Negative for chills, fever and unexpected weight change.  HENT: Negative.   Eyes: Negative.   Respiratory: Positive for shortness of breath. Negative for chest tightness.   Cardiovascular: Negative for chest pain, palpitations and leg swelling.  Gastrointestinal: Negative for abdominal distention, abdominal pain, constipation, diarrhea, nausea and vomiting.  Musculoskeletal: Negative.   Skin: Negative.   Neurological: Positive for weakness.  Psychiatric/Behavioral: Negative.       Objective:   Physical Exam  Constitutional: He is oriented to person, place, and time. He appears well-developed and well-nourished.  HENT:  Head: Normocephalic and atraumatic.  Eyes: EOM are normal.  Neck: Normal range of motion.  Cardiovascular: Normal rate and regular rhythm.  Pulmonary/Chest: Effort normal. No respiratory distress. He has no wheezes. He has no rales.  Oxygen in place, lung exam stable  Abdominal: Soft. Bowel sounds are normal. He exhibits no distension. There is no tenderness. There is no rebound.  Musculoskeletal: He exhibits no edema.  Neurological: He is alert and oriented to person, place, and time. Coordination normal.  Skin: Skin is warm and dry.   Vitals:   01/11/18 1413  BP:  100/60  Pulse: 82  Temp: 98.2 F (36.8 C)  TempSrc: Oral  SpO2: 91%  Weight: 270 lb (122.5 kg)  Height: 5\' 11"  (1.803 m)      Assessment & Plan:

## 2018-01-13 ENCOUNTER — Encounter (HOSPITAL_COMMUNITY)
Admission: RE | Admit: 2018-01-13 | Discharge: 2018-01-13 | Disposition: A | Discharge status: Home / Self Care | Payer: Medicare Other | Source: Ambulatory Visit | Attending: Internal Medicine | Admitting: Internal Medicine

## 2018-01-13 DIAGNOSIS — C3490 Malignant neoplasm of unspecified part of unspecified bronchus or lung: Secondary | ICD-10-CM | POA: Diagnosis not present

## 2018-01-13 LAB — GLUCOSE, CAPILLARY: GLUCOSE-CAPILLARY: 149 mg/dL — AB (ref 65–99)

## 2018-01-13 MED ORDER — TECHNETIUM TC 99M MEBROFENIN IV KIT
13.6900 | PACK | Freq: Once | INTRAVENOUS | Status: AC | PRN
  Administered 2018-01-13: 13.69 via INTRAVENOUS

## 2018-01-14 ENCOUNTER — Encounter: Payer: Self-pay | Admitting: Internal Medicine

## 2018-01-14 NOTE — Assessment & Plan Note (Signed)
We talked about how this fluid can re accumulate due to the malignancy. He is going to have follow up with pulmonary. Using trelegy and oxygen. We talked about how continued smoking is detrimental to his health.

## 2018-01-14 NOTE — Assessment & Plan Note (Signed)
Weight stable from discharge from hospital. He declines need for PT at home although he is clearly deconditioned on exam.

## 2018-01-14 NOTE — Assessment & Plan Note (Signed)
Needs crestor brand name due to leg and ankle swelling from generic. Rx done today for DAW.

## 2018-01-15 ENCOUNTER — Other Ambulatory Visit: Payer: Self-pay | Admitting: Cardiovascular Disease

## 2018-01-17 NOTE — Telephone Encounter (Signed)
Pt is a 67 yr old male who last saw Dr Burt Knack on 10/21/17. Last noted weight on 01/11/18 was 122.5Kg. Last noted serum creatine was 1.16 on 01/10/18. CrCl is 107 mL/min. Will refill Xarelto 20mg  QD.

## 2018-01-20 ENCOUNTER — Encounter (HOSPITAL_COMMUNITY): Payer: Self-pay | Admitting: Internal Medicine

## 2018-01-21 ENCOUNTER — Ambulatory Visit: Payer: Medicare Other | Admitting: Physician Assistant

## 2018-01-24 ENCOUNTER — Inpatient Hospital Stay (HOSPITAL_COMMUNITY)
Admission: EM | Admit: 2018-01-24 | Discharge: 2018-01-28 | DRG: 189 | Disposition: A | Discharge status: Home Health | Payer: Medicare Other | Attending: Internal Medicine | Admitting: Internal Medicine

## 2018-01-24 ENCOUNTER — Encounter (HOSPITAL_COMMUNITY): Payer: Self-pay

## 2018-01-24 ENCOUNTER — Emergency Department (HOSPITAL_COMMUNITY): Payer: Medicare Other

## 2018-01-24 DIAGNOSIS — Z833 Family history of diabetes mellitus: Secondary | ICD-10-CM

## 2018-01-24 DIAGNOSIS — J441 Chronic obstructive pulmonary disease with (acute) exacerbation: Secondary | ICD-10-CM | POA: Diagnosis not present

## 2018-01-24 DIAGNOSIS — E872 Acidosis, unspecified: Secondary | ICD-10-CM | POA: Diagnosis present

## 2018-01-24 DIAGNOSIS — J90 Pleural effusion, not elsewhere classified: Secondary | ICD-10-CM

## 2018-01-24 DIAGNOSIS — J9621 Acute and chronic respiratory failure with hypoxia: Secondary | ICD-10-CM | POA: Diagnosis not present

## 2018-01-24 DIAGNOSIS — Z7901 Long term (current) use of anticoagulants: Secondary | ICD-10-CM

## 2018-01-24 DIAGNOSIS — K219 Gastro-esophageal reflux disease without esophagitis: Secondary | ICD-10-CM | POA: Diagnosis present

## 2018-01-24 DIAGNOSIS — F17210 Nicotine dependence, cigarettes, uncomplicated: Secondary | ICD-10-CM | POA: Diagnosis present

## 2018-01-24 DIAGNOSIS — C3411 Malignant neoplasm of upper lobe, right bronchus or lung: Secondary | ICD-10-CM | POA: Diagnosis present

## 2018-01-24 DIAGNOSIS — E785 Hyperlipidemia, unspecified: Secondary | ICD-10-CM | POA: Diagnosis present

## 2018-01-24 DIAGNOSIS — I739 Peripheral vascular disease, unspecified: Secondary | ICD-10-CM | POA: Diagnosis present

## 2018-01-24 DIAGNOSIS — Z794 Long term (current) use of insulin: Secondary | ICD-10-CM

## 2018-01-24 DIAGNOSIS — Z888 Allergy status to other drugs, medicaments and biological substances status: Secondary | ICD-10-CM

## 2018-01-24 DIAGNOSIS — I4819 Other persistent atrial fibrillation: Secondary | ICD-10-CM | POA: Diagnosis present

## 2018-01-24 DIAGNOSIS — I351 Nonrheumatic aortic (valve) insufficiency: Secondary | ICD-10-CM | POA: Diagnosis present

## 2018-01-24 DIAGNOSIS — J910 Malignant pleural effusion: Secondary | ICD-10-CM | POA: Diagnosis not present

## 2018-01-24 DIAGNOSIS — E875 Hyperkalemia: Secondary | ICD-10-CM | POA: Diagnosis present

## 2018-01-24 DIAGNOSIS — I481 Persistent atrial fibrillation: Secondary | ICD-10-CM

## 2018-01-24 DIAGNOSIS — R0602 Shortness of breath: Secondary | ICD-10-CM | POA: Diagnosis not present

## 2018-01-24 DIAGNOSIS — I25709 Atherosclerosis of coronary artery bypass graft(s), unspecified, with unspecified angina pectoris: Secondary | ICD-10-CM | POA: Diagnosis not present

## 2018-01-24 DIAGNOSIS — E669 Obesity, unspecified: Secondary | ICD-10-CM | POA: Diagnosis present

## 2018-01-24 DIAGNOSIS — I482 Chronic atrial fibrillation: Secondary | ICD-10-CM | POA: Diagnosis present

## 2018-01-24 DIAGNOSIS — Z6837 Body mass index (BMI) 37.0-37.9, adult: Secondary | ICD-10-CM

## 2018-01-24 DIAGNOSIS — I2510 Atherosclerotic heart disease of native coronary artery without angina pectoris: Secondary | ICD-10-CM | POA: Diagnosis present

## 2018-01-24 DIAGNOSIS — R042 Hemoptysis: Secondary | ICD-10-CM | POA: Diagnosis not present

## 2018-01-24 DIAGNOSIS — Z96643 Presence of artificial hip joint, bilateral: Secondary | ICD-10-CM | POA: Diagnosis present

## 2018-01-24 DIAGNOSIS — Z9981 Dependence on supplemental oxygen: Secondary | ICD-10-CM

## 2018-01-24 DIAGNOSIS — J189 Pneumonia, unspecified organism: Secondary | ICD-10-CM | POA: Diagnosis not present

## 2018-01-24 DIAGNOSIS — I5033 Acute on chronic diastolic (congestive) heart failure: Secondary | ICD-10-CM | POA: Diagnosis not present

## 2018-01-24 DIAGNOSIS — Z86010 Personal history of colonic polyps: Secondary | ICD-10-CM

## 2018-01-24 DIAGNOSIS — G4733 Obstructive sleep apnea (adult) (pediatric): Secondary | ICD-10-CM | POA: Diagnosis present

## 2018-01-24 DIAGNOSIS — C3491 Malignant neoplasm of unspecified part of right bronchus or lung: Secondary | ICD-10-CM | POA: Diagnosis present

## 2018-01-24 DIAGNOSIS — E1159 Type 2 diabetes mellitus with other circulatory complications: Secondary | ICD-10-CM | POA: Diagnosis not present

## 2018-01-24 DIAGNOSIS — Z823 Family history of stroke: Secondary | ICD-10-CM

## 2018-01-24 DIAGNOSIS — Z8249 Family history of ischemic heart disease and other diseases of the circulatory system: Secondary | ICD-10-CM

## 2018-01-24 DIAGNOSIS — I110 Hypertensive heart disease with heart failure: Secondary | ICD-10-CM | POA: Diagnosis present

## 2018-01-24 DIAGNOSIS — J440 Chronic obstructive pulmonary disease with acute lower respiratory infection: Secondary | ICD-10-CM | POA: Diagnosis present

## 2018-01-24 DIAGNOSIS — E1151 Type 2 diabetes mellitus with diabetic peripheral angiopathy without gangrene: Secondary | ICD-10-CM | POA: Diagnosis present

## 2018-01-24 DIAGNOSIS — I4892 Unspecified atrial flutter: Secondary | ICD-10-CM | POA: Diagnosis present

## 2018-01-24 DIAGNOSIS — Z801 Family history of malignant neoplasm of trachea, bronchus and lung: Secondary | ICD-10-CM

## 2018-01-24 DIAGNOSIS — Z808 Family history of malignant neoplasm of other organs or systems: Secondary | ICD-10-CM

## 2018-01-24 DIAGNOSIS — I272 Pulmonary hypertension, unspecified: Secondary | ICD-10-CM | POA: Diagnosis present

## 2018-01-24 LAB — BLOOD GAS, ARTERIAL
Acid-Base Excess: 0.5 mmol/L (ref 0.0–2.0)
Bicarbonate: 23.8 mmol/L (ref 20.0–28.0)
DRAWN BY: 422461
FIO2: 100
O2 CONTENT: 15 L/min
O2 SAT: 98.4 %
Patient temperature: 98.6
pCO2 arterial: 35.7 mmHg (ref 32.0–48.0)
pH, Arterial: 7.439 (ref 7.350–7.450)
pO2, Arterial: 140 mmHg — ABNORMAL HIGH (ref 83.0–108.0)

## 2018-01-24 LAB — CBC
HEMATOCRIT: 47.2 % (ref 39.0–52.0)
Hemoglobin: 15.3 g/dL (ref 13.0–17.0)
MCH: 29.8 pg (ref 26.0–34.0)
MCHC: 32.4 g/dL (ref 30.0–36.0)
MCV: 92 fL (ref 78.0–100.0)
Platelets: 195 10*3/uL (ref 150–400)
RBC: 5.13 MIL/uL (ref 4.22–5.81)
RDW: 14.3 % (ref 11.5–15.5)
WBC: 8.7 10*3/uL (ref 4.0–10.5)

## 2018-01-24 LAB — I-STAT CG4 LACTIC ACID, ED
Lactic Acid, Venous: 2.06 mmol/L (ref 0.5–1.9)
Lactic Acid, Venous: 3.47 mmol/L (ref 0.5–1.9)

## 2018-01-24 LAB — COMPREHENSIVE METABOLIC PANEL
ALBUMIN: 3.3 g/dL — AB (ref 3.5–5.0)
ALT: 28 U/L (ref 17–63)
AST: 25 U/L (ref 15–41)
Alkaline Phosphatase: 67 U/L (ref 38–126)
Anion gap: 16 — ABNORMAL HIGH (ref 5–15)
BUN: 14 mg/dL (ref 6–20)
CHLORIDE: 100 mmol/L — AB (ref 101–111)
CO2: 25 mmol/L (ref 22–32)
Calcium: 9.3 mg/dL (ref 8.9–10.3)
Creatinine, Ser: 1.19 mg/dL (ref 0.61–1.24)
GFR calc Af Amer: >60 mL/min (ref >60)
GFR calc non Af Amer: >60 mL/min (ref >60)
GLUCOSE: 133 mg/dL — AB (ref 65–99)
POTASSIUM: 4 mmol/L (ref 3.5–5.1)
SODIUM: 141 mmol/L (ref 135–145)
Total Bilirubin: 0.6 mg/dL (ref 0.3–1.2)
Total Protein: 8.3 g/dL — ABNORMAL HIGH (ref 6.5–8.1)

## 2018-01-24 LAB — CBG MONITORING, ED: GLUCOSE-CAPILLARY: 168 mg/dL — AB (ref 65–99)

## 2018-01-24 LAB — BRAIN NATRIURETIC PEPTIDE: B Natriuretic Peptide: 139.7 pg/mL — ABNORMAL HIGH (ref 0.0–100.0)

## 2018-01-24 LAB — TROPONIN I: Troponin I: <0.03 ng/mL (ref <0.03)

## 2018-01-24 MED ORDER — IPRATROPIUM BROMIDE 0.02 % IN SOLN
0.5000 mg | Freq: Once | RESPIRATORY_TRACT | Status: AC
  Administered 2018-01-24: 0.5 mg via RESPIRATORY_TRACT
  Filled 2018-01-24: qty 2.5

## 2018-01-24 MED ORDER — SODIUM CHLORIDE 0.9 % IV SOLN: 500.0000 mg | INTRAVENOUS | Status: DC

## 2018-01-24 MED ORDER — SODIUM CHLORIDE 0.9 % IV BOLUS (SEPSIS)
500.0000 mL | Freq: Once | INTRAVENOUS | Status: AC
  Administered 2018-01-24: 500 mL via INTRAVENOUS

## 2018-01-24 MED ORDER — METHYLPREDNISOLONE SODIUM SUCC 125 MG IJ SOLR
125.0000 mg | Freq: Once | INTRAMUSCULAR | Status: AC
  Administered 2018-01-24: 125 mg via INTRAVENOUS
  Filled 2018-01-24: qty 2

## 2018-01-24 MED ORDER — ALBUTEROL SULFATE (2.5 MG/3ML) 0.083% IN NEBU
5.0000 mg | INHALATION_SOLUTION | RESPIRATORY_TRACT | Status: AC
  Administered 2018-01-24 (×3): 5 mg via RESPIRATORY_TRACT
  Filled 2018-01-24 (×3): qty 6

## 2018-01-24 MED ORDER — SODIUM CHLORIDE 0.9 % IV BOLUS (SEPSIS)
1000.0000 mL | Freq: Once | INTRAVENOUS | Status: AC
  Administered 2018-01-24: 1000 mL via INTRAVENOUS

## 2018-01-24 MED ORDER — SODIUM CHLORIDE 0.9 % IV SOLN
1000.0000 mL | INTRAVENOUS | Status: DC
  Administered 2018-01-24: 1000 mL via INTRAVENOUS

## 2018-01-24 MED ORDER — AZITHROMYCIN 250 MG PO TABS
500.0000 mg | ORAL_TABLET | Freq: Once | ORAL | Status: AC
  Administered 2018-01-24: 500 mg via ORAL
  Filled 2018-01-24: qty 2

## 2018-01-24 MED ORDER — SODIUM CHLORIDE 0.9 % IV BOLUS (SEPSIS)
400.0000 mL | Freq: Once | INTRAVENOUS | Status: AC
  Administered 2018-01-24: 400 mL via INTRAVENOUS

## 2018-01-24 MED ORDER — SODIUM CHLORIDE 0.9 % IV SOLN: 2.0000 g | INTRAVENOUS | Status: DC

## 2018-01-24 MED ORDER — SODIUM CHLORIDE 0.9 % IV SOLN
1.0000 g | Freq: Once | INTRAVENOUS | Status: AC
  Administered 2018-01-24: 1 g via INTRAVENOUS
  Filled 2018-01-24: qty 10

## 2018-01-24 NOTE — H&P (Signed)
History and Physical    Neil Carroll MHW:[id number] DOB: [birth year] DOA: 01/24/2018  Referring MD/NP/PA: Dr. Marye Round PCP: Hoyt Koch, MD  Patient coming from: home  Chief Complaint: Shortness of breath  I have personally briefly reviewed patient's old medical records in [place]   HPI: Neil Carroll. is a 67 y.o. male with medical history significant of A. Fib on Xarelto, diastolic CHF, squamous cell carcinoma right lung followed by Dr. 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  Recently admitted from 4/5-4/10; for shortness of breath found to have acute large right pleural effusion.  Patient underwent thoracentesis with improvement of symptoms.  He reports progressively worsening cough and shortness of breath symptoms on home oxygen settings despite utilizing inhalers.  He bumped his oxygen up to 5 L without much improvement.  Denies any complaints of fever, chills, chest pain, or diarrhea. Associated symptoms include chronic lower extremity swelling, orthopnea, intermittent nausea, and vomiting symptoms.  Reports having a follow-up with Dr. Earlie Server on Friday to reevaluate accumulation of fluid on his lung and need placement of a catheter.  ED Course: On admission to the emergency department patient was seen to be afebrile, pulse 75-1 33, respirations 19-22, blood pressure 94/58-120/75, O2 saturations 85% improved to 100% on partial rebreather mask.  Initial ABG revealed pH 7.439, PCO2 35.7,and PO2 140.  Labs revealed BNP 139.7 and lactic acid elevated up to 3.47.  CBC and CMP are relatively within normal limits.  Chest x-ray showing worsening aeration of the right lung field with possibility of infiltrate or effusion.  Patient was empirically given antibiotics of cefepime and vancomycin.  Review of Systems  Constitutional: Positive for  malaise/fatigue. Negative for chills and fever.  HENT: Negative for congestion and ear discharge.   Eyes: Negative for pain and discharge.  Respiratory: Positive for cough, sputum production, shortness of breath and wheezing.   Cardiovascular: Positive for leg swelling. Negative for chest pain.  Gastrointestinal: Positive for nausea and vomiting.  Genitourinary: Negative for dysuria and frequency.  Musculoskeletal: Negative for falls.  Skin: Positive for rash.  Neurological: Positive for weakness. Negative for speech change and focal weakness.  Psychiatric/Behavioral: Negative for suicidal ideas. The patient has insomnia.     Past Medical History:  Diagnosis Date  . Adenomatous colon polyp   . Aortic insufficiency    Echo 3/18: Severe concentric LVH, EF 60-65, normal wall motion, moderate AI, mild LAE, mild TR // Echo 9/18: Mild concentric LVH, EF 60-65, normal wall motion, trivial AI, MAC, mild BAE  . Arthritis    left hip replacement  . Atrial flutter ([place])    onset  2011. s/p EPS/RFA 12/2011  . Cataract   . Chronic bronchitis   . Contact dermatitis and other eczema due to plants (except food)   . COPD (chronic obstructive pulmonary disease) ([place])   . GERD (gastroesophageal reflux disease)   . Hyperlipidemia   . Hypertension   . Lung cancer ([place])   . LV dysfunction    EF 45-50% 12/2011  . OSA (obstructive sleep apnea) 10/13/2016   Mild with AHI 9.7/hr with significant oxygen desaturations as low as 76% now on CPAP  . Other peripheral vascular disease(443.89)    bilateral lower extremity  . Tobacco use disorder    dependent  . Transient global amnesia   . Tuberculosis   . Type II  diabetes mellitus ([place])     Past Surgical History:  Procedure Laterality Date  . A FLUTTER ABLATION N/A 12/28/2011   Procedure: ABLATION A FLUTTER;  Surgeon: Evans Lance, MD;  Location: [facility] CATH LAB;  Service: Cardiovascular;  Laterality: N/A;  . CARDIAC CATHETERIZATION N/A 07/10/2015    Procedure: Left Heart Cath and Coronary Angiography;  Surgeon: Sherren Mocha, MD;  Location: [facility] CV LAB;  Service: Cardiovascular;  Laterality: N/A;  . CARDIAC ELECTROPHYSIOLOGY MAPPING AND ABLATION  03/2010  . CARDIOVERSION N/A 07/13/2016   Procedure: CARDIOVERSION;  Surgeon: Skeet Latch, MD;  Location: [facility];  Service: Cardiovascular;  Laterality: N/A;  . COLONOSCOPY    . colonoscopy with polypectomy  2006 & 2011   Dr [name]  . CYSTOSCOPY  11/2007   Dr Jeffie Pollock  . PARTIAL HIP ARTHROPLASTY Right 01/2000   "Right; replaced ball & stem"  . PARTIAL HIP ARTHROPLASTY Left   . POLYPECTOMY    . TEE WITHOUT CARDIOVERSION  12/28/2011   Procedure: TRANSESOPHAGEAL ECHOCARDIOGRAM (TEE);  Surgeon: Larey Dresser, MD;  Location[facility] ENDOSCOPY;  Service: Cardiovascular;  Laterality: N/A;     reports that he has been smoking cigarettes.  He has a 44.00 pack-year smoking history. He has never used smokeless tobacco. He reports that he does not drink alcohol or use drugs.  Allergies  Allergen Reactions  . Niacin Nausea Only    REACTION: upset stomach    Family History  Problem Relation Age of Onset  . Stroke Mother 75  . Hypertension Mother   . Diabetes Mother   . Diabetes Paternal Grandmother   . Lung cancer Father        smoker  . Diabetes Brother   . Heart attack Brother 6  . Diabetes Brother   . Hepatitis C Brother   . Throat cancer Paternal Uncle        1/2 uncle  . Heart attack Brother 42  . Crohn's disease Son   . Colon cancer Neg Hx   . Esophageal cancer Neg Hx   . Rectal cancer Neg Hx   . Stomach cancer Neg Hx     Prior to Admission medications   Medication Sig Start Date End Date Taking? Authorizing Provider  acetaminophen (TYLENOL) 650 MG CR tablet Take 1,300 mg by mouth every 8 (eight) hours as needed for pain.    Yes [provider]  CRESTOR 10 MG tablet Take 1 tablet (10 mg total) by mouth daily. 01/11/18  Yes Hoyt Koch, MD  diltiazem  (CARTIA XT) 180 MG 24 hr capsule Take 1 capsule (180 mg total) by mouth 2 (two) times daily. 01/08/17  Yes Sherren Mocha, MD  etanercept (ENBREL SURECLICK) 50 MG/ML injection Inject 50 mg into the skin as directed. Inject on Sundays & Wednesdays    Yes [provider]  Flaxseed, Linseed, (FLAX SEED OIL) 1000 MG CAPS Take 1 capsule by mouth 2 (two) times daily.    Yes [provider]  Fluticasone-Umeclidin-Vilant (TRELEGY ELLIPTA) 100-62.5-25 MCG/INH AEPB Inhale 1 puff into the lungs daily. 10/14/17  Yes Hoyt Koch, MD  furosemide (LASIX) 80 MG tablet Take 1 tablet (80 mg total) by mouth 2 (two) times daily. 12/30/17  Yes Sherren Mocha, MD  Insulin Glargine (LANTUS SOLOSTAR) 100 UNIT/ML Solostar Pen Inject 30 Units into the skin daily at 10 pm. 12/08/17  Yes Elayne Snare, MD  ipratropium-albuterol (DUONEB) 0.5-2.5 (3) MG/3ML SOLN Take 3 mLs by nebulization every 4 (four) hours as needed.  12/29/17  Yes Hosie Poisson, MD  liraglutide (VICTOZA) 18 MG/3ML SOPN INJECT 0.3ML (=1.8MG)      SUBCUTANEOUSLY DAILY 07/26/17  Yes Elayne Snare, MD  losartan (COZAAR) 25 MG tablet TAKE 1 TABLET BY MOUTH ONCE DAILY 12/23/17  Yes Sherren Mocha, MD  metFORMIN (GLUCOPHAGE) 1000 MG tablet TAKE 1 TABLET BY MOUTH TWICE DAILY WITH FOOD 12/20/17  Yes Elayne Snare, MD  metoprolol tartrate (LOPRESSOR) 100 MG tablet take1 tablet by mouth every morning and take 1 and 1/2 tablets by mouth every evening 08/03/17  Yes Sherren Mocha, MD  Multiple Vitamins-Minerals (CENTRUM ADULTS) TABS Take 1 tablet by mouth daily.   Yes [provider]  mupirocin ointment (BACTROBAN) 2 % Apply 1 application topically 2 (two) times daily as needed (skin).    Yes [provider]  nicotine (NICODERM CQ - DOSED IN MG/24 HOURS) 21 mg/24hr patch Place 1 patch (21 mg total) onto the skin daily. 12/30/17  Yes Hosie Poisson, MD  Nintedanib (OFEV) 150 MG CAPS Take 1 capsule (150 mg total) by mouth 2 (two) times daily.  10/29/17  Yes Rigoberto Noel, MD  Omega-3 Fatty Acids (FISH OIL) 1000 MG CAPS Take 1 capsule by mouth 2 (two) times daily.    Yes [provider]  potassium chloride SA (K-DUR,KLOR-CON) 20 MEQ tablet take 1 tablet by mouth twice a day 04/22/17  Yes Imogene Burn, PA-C  tamsulosin (FLOMAX) 0.4 MG CAPS capsule Take 0.4 mg by mouth daily.    Yes [provider]  triamcinolone cream (KENALOG) 0.1 % Apply 1 application topically 2 (two) times daily as needed (affected area/skin).    Yes [provider]  XARELTO 20 MG TABS tablet TAKE 1 TABLET BY MOUTH ONCE DAILY 01/17/18  Yes Sherren Mocha, MD  guaiFENesin (MUCINEX) 600 MG 12 hr tablet Take 1 tablet (600 mg total) by mouth 2 (two) times daily. Patient not taking: Reported on 01/11/2018 12/29/17   Hosie Poisson, MD  NOVOFINE 32G X 6 MM MISC use twice a day 03/22/17   Elayne Snare, MD  [facility] DELICA LANCETS FINE MISC TEST BLOOD SUGAR DAILY 03/22/17   Elayne Snare, MD  predniSONE (DELTASONE) 20 MG tablet Prednisone 60 mg daily for 2 days followed by  Prednisone 40 mg daily for 3 days followed by  Prednisone 20 mg daily for 3 days . Patient not taking: Reported on 01/24/2018 12/29/17   Hosie Poisson, MD  senna-docusate (SENOKOT-S) 8.6-50 MG tablet Take 1 tablet by mouth at bedtime as needed for mild constipation. Patient not taking: Reported on 01/11/2018 12/29/17   Hosie Poisson, MD    Physical Exam:  Constitutional: Elderly male in moderate respiratory distress Vitals:   01/24/18 2230 01/24/18 2233 01/24/18 2242 01/24/18 2300  BP:  110/72  104/69  Pulse: (!) 110 (!) 111  (!) 133  Resp: _0 Temp:      TempSrc:      SpO2: 96% 99%  96%  Weight:   122.5 kg (270 lb)   Height:   _1  (1.803 m)    Eyes: PERRL, lids and conjunctivae normal ENMT: Mucous membranes are moist. Posterior pharynx clear of any exudate or lesions.  Neck: normal, supple, no masses, no thyromegaly Respiratory: Positive expiratory wheezes noted with  decreased aeration notably on right lung field.  Patient on partial nonrebreather mask 15 L with accessory muscle usage.  Patient talking in short sentences. Cardiovascular: Tachycardic.  +2 pitting lower extremity edema Abdomen: no tenderness, no  masses palpated. No hepatosplenomegaly. Bowel sounds positive.  Musculoskeletal: no clubbing / cyanosis. No joint deformity upper and lower extremities. Good ROM, no contractures. Normal muscle tone.  Skin: Venous stasis noted of the bilateral lower extremities with 2 cm ulceration present on the anterior aspect of the left leg. Neurologic: CN 2-12 grossly intact. Sensation intact, DTR normal. Strength 5/5 in all 4.  Psychiatric: Normal judgment and insight. Alert and oriented x 3. Normal mood.     Labs on Admission: I have personally reviewed following labs and imaging studies  CBC: Recent Labs  Lab 01/24/18 2002  WBC 8.7  HGB 15.3  HCT 47.2  MCV 92.0  PLT 109   Basic Metabolic Panel: Recent Labs  Lab 01/24/18 2047  NA 141  K 4.0  CL 100*  CO2 25  GLUCOSE 133*  BUN 14  CREATININE 1.19  CALCIUM 9.3   GFR: Estimated Creatinine Clearance: 80.3 mL/min (by C-G formula based on SCr of 1.19 mg/dL). Liver Function Tests: Recent Labs  Lab 01/24/18 2047  AST 25  ALT 28  ALKPHOS 67  BILITOT 0.6  PROT 8.3*  ALBUMIN 3.3*   No results for input(s): LIPASE, AMYLASE in the last 168 hours. No results for input(s): AMMONIA in the last 168 hours. Coagulation Profile: No results for input(s): INR, PROTIME in the last 168 hours. Cardiac Enzymes: Recent Labs  Lab 01/24/18 2047  TROPONINI <0.03   BNP (last 3 results) Recent Labs    10/13/17 1202 10/21/17 1203  PROBNP 992* 962*   HbA1C: No results for input(s): HGBA1C in the last 72 hours. CBG: No results for input(s): GLUCAP in the last 168 hours. Lipid Profile: No results for input(s): CHOL, HDL, LDLCALC, TRIG, CHOLHDL, LDLDIRECT in the last 72 hours. Thyroid Function  Tests: No results for input(s): TSH, T4TOTAL, FREET4, T3FREE, THYROIDAB in the last 72 hours. Anemia Panel: No results for input(s): VITAMINB12, FOLATE, FERRITIN, TIBC, IRON, RETICCTPCT in the last 72 hours. Urine analysis:    Component Value Date/Time   COLORURINE YELLOW 05/25/2017 [place] 05/25/2017 1158   LABSPEC 1.010 05/25/2017 1158   PHURINE 6.0 05/25/2017 1158   GLUCOSEU NEGATIVE 05/25/2017 1158   HGBUR NEGATIVE 05/25/2017 1158   BILIRUBINUR NEGATIVE 05/25/2017 1158   KETONESUR NEGATIVE 05/25/2017 1158   UROBILINOGEN 0.2 05/25/2017 1158   NITRITE NEGATIVE 05/25/2017 1158   LEUKOCYTESUR NEGATIVE 05/25/2017 1158   Sepsis Labs: No results found for this or any previous visit (from the past 240 hour(s)).   Radiological Exams on Admission: Dg Chest 2 View  Result Date: 01/24/2018 CLINICAL DATA:  Shortness of breath.  Lung cancer.  COPD. EXAM: CHEST - 2 VIEW COMPARISON:  01/05/2018. FINDINGS: Marked worsening of BILATERAL pulmonary opacities, particularly on the RIGHT, with consolidation, atelectasis, and effusion. Asymmetric interstitial like opacity at the LEFT base is increased, without effusion or significant volume loss. Cardiomegaly. Thoracic atherosclerosis. IMPRESSION: Marked worsening aeration, with significant increase in RIGHT lung opacity, likely combination of atelectasis, infiltrate, and effusion, likely worsening metastatic disease. Electronically Signed   By: Staci Righter M.D.   On: 01/24/2018 18:44    EKG: Independently reviewed. Atrial fibrillation at 88 bpm  Assessment/Plan Acute on chronic respiratory failure with hypoxia, COPD exacerbation : Patient currently on partial nonrebreather mask to maintain O2 saturations.  Chest x-ray showing new opacities with new and worsening aeration. 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  Question return of malignant pleural effusion and/or underlying infection - Consult IR for possible need of thoracentesis and likely need of Pleurx catheter in a.m.  Lactic acidosis, question possible pneumonia: Acute.  Initial lactic acid elevated up to 3.47.  Question possibility of underlying infection.  Patient was started on empiric antibiotics of ceftriaxone and azithromycin for possible underlying infection.  Other cause for increasing lactic acid could be related to breathing treatments given. - Check procalcitonin - Continue to trend lactic acid level  - Continue empiric antibiotics of ceftriaxone and azithromycin  Chronic diastolic heart failure: Does not appear to be fluid overloaded at this time.  Last EF noted to be 60 to 65% in 05/2017. - Daily weights    Diabetes mellitus type 2: Uncontrolled.  Last hemoglobin A1c noted to be 7.3 on 12/08/2017. 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 Earlie Server in the outpatient setting. - Dr. Julien Nordmann added to treatment team  Permanent atrial fibrillation: CHA2DS2-VASc score = 5 - Continue Xarelto, Cardizem, and metoprolol  CAD  Essential HTN: Stable. - Continue metoprolol, losartan, furosemide  Tobacco abuse - Continue nicotine patch  DVT prophylaxis: Xarelto   Code Status: full  Family Communication No family present at bedside Disposition Plan: TBD Consults called: none  Admission status: Inpatient  Norval Morton MD [facility] Pager [phone]   If 7PM-7AM, please contact night-coverage [email or web address] Password [facility]  01/24/2018, 11:32 PM

## 2018-01-24 NOTE — ED Notes (Signed)
Patient transported to X-ray 

## 2018-01-24 NOTE — ED Triage Notes (Signed)
Patient c/o SOB x 1 week. Patient is on Home O2 4L/min via [place], but has been using O2 5l/min via [place] today due to increased SOB. Patient denies any CP.

## 2018-01-24 NOTE — Progress Notes (Signed)
Pt placed on BiPAP for oxygen saturation and WOB.  Pt wore mask for about 1 minute before demanding that I remove the mask and stating that he was unable to tolerate it due to claustrophobia.  BiPAP mask removed and Pt placed back on NRB.  MD aware.

## 2018-01-24 NOTE — ED Provider Notes (Signed)
[facility] DEPT Provider Note   CSN: [id number] Arrival date & time: 01/24/18  1722     History   Chief Complaint Chief Complaint  Patient presents with  . Shortness of Breath    HPI Neil Carroll. is a 67 y.o. male.  HPI Pt presents to the ED for worsening shortness of breath.  Pt has hx of COPD and also a lung cancer.  Patient also has had trouble with recurrent pleural effusions.  Patient states he started having increasing shortness of breath over the last several days.  He has been taking his medications without any relief.  Patient feels like he may be having recurrent fluid accumulation.  He denies any fevers.  No vomiting or diarrhea.  Patient's normally on oxygen but feels like he has had to  increase it. He does continue to smoke. Past Medical History:  Diagnosis Date  . Adenomatous colon polyp   . Aortic insufficiency    Echo 3/18: Severe concentric LVH, EF 60-65, normal wall motion, moderate AI, mild LAE, mild TR // Echo 9/18: Mild concentric LVH, EF 60-65, normal wall motion, trivial AI, MAC, mild BAE  . Arthritis    left hip replacement  . Atrial flutter ([place])    onset  2011. s/p EPS/RFA 12/2011  . Cataract   . Chronic bronchitis   . Contact dermatitis and other eczema due to plants (except food)   . COPD (chronic obstructive pulmonary disease) ([place])   . GERD (gastroesophageal reflux disease)   . Hyperlipidemia   . Hypertension   . Lung cancer ([place])   . LV dysfunction    EF 45-50% 12/2011  . OSA (obstructive sleep apnea) 10/13/2016   Mild with AHI 9.7/hr with significant oxygen desaturations as low as 76% now on CPAP  . Other peripheral vascular disease(443.89)    bilateral lower extremity  . Tobacco use disorder    dependent  . Transient global amnesia   . Tuberculosis   . Type II diabetes mellitus [facility])     Patient Active Problem List   Diagnosis Date Noted  . Community acquired pneumonia of right middle lobe  of lung ([place])   . Tobacco abuse counseling 12/25/2017  . Acute on chronic respiratory failure ([place]) 12/24/2017  . Syncope 05/25/2017  . 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 IPF (idiopathic pulmonary fibrosis) ([place]) 02/16/2017  . Chronic respiratory failure ([place]) 01/15/2017  . Aortic insufficiency   . OSA (obstructive sleep apnea) 10/13/2016  . Obesity (BMI 30-39.9) 10/13/2016  . Nocturnal hypoxemia 10/13/2016  . Lower back pain 09/17/2016  . Chronic anticoagulation-Xarelto (CHADs VASc=5) 02/27/2016  . Persistent atrial fibrillation ([place]) 02/26/2016  . Acute on chronic diastolic heart failure ([place]) 02/26/2016  . Coronary artery disease involving coronary bypass graft of native heart with angina pectoris ([place]) 11/10/2015  . Thrombocytopenia ([place]) 11/10/2015  . Pulmonary HTN ([place]) 11/10/2015  . Angina pectoris syndrome ([place]) 07/09/2015  . Left ventricular systolic dysfunction 64/33/2951  . Psoriasis 03/06/2010  . 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 PVD (peripheral vascular disease) with claudication ([place]) 05/21/2008  . Hyperlipidemia 11/22/2007  . Smoker 11/22/2007  . Bronchitis, chronic obstructive, with exacerbation ([place]) 11/22/2007    Past Surgical History:  Procedure Laterality Date  . A FLUTTER ABLATION N/A 12/28/2011   Procedure: ABLATION A FLUTTER;  Surgeon: Evans Lance, MD;  Location: [facility] CATH LAB;  Service: Cardiovascular;  Laterality: N/A;  . CARDIAC CATHETERIZATION N/A 07/10/2015   Procedure: Left Heart Cath and Coronary Angiography;  Surgeon: Sherren Mocha, MD;  Location: [facility] CV LAB;  Service: Cardiovascular;  Laterality: N/A;  . CARDIAC ELECTROPHYSIOLOGY MAPPING AND ABLATION  03/2010  . CARDIOVERSION N/A 07/13/2016   Procedure: CARDIOVERSION;  Surgeon: Skeet Latch, MD;  Location: [facility];  Service:  Cardiovascular;  Laterality: N/A;  . COLONOSCOPY    . colonoscopy with polypectomy  2006 & 2011   Dr [name]  . CYSTOSCOPY  11/2007   Dr Jeffie Pollock  . PARTIAL HIP ARTHROPLASTY Right 01/2000   "Right; replaced ball & stem"  . PARTIAL HIP ARTHROPLASTY Left   . POLYPECTOMY    . TEE WITHOUT CARDIOVERSION  12/28/2011   Procedure: TRANSESOPHAGEAL ECHOCARDIOGRAM (TEE);  Surgeon: Larey Dresser, MD;  Location: [facility];  Service: Cardiovascular;  Laterality: N/A;        Home Medications    Prior to Admission medications   Medication Sig Start Date End Date Taking? Authorizing Provider  acetaminophen (TYLENOL) 650 MG CR tablet Take 1,300 mg by mouth every 8 (eight) hours as needed for pain.    Yes [provider]  CRESTOR 10 MG tablet Take 1 tablet (10 mg total) by mouth daily. 01/11/18  Yes Hoyt Koch, MD  diltiazem (CARTIA XT) 180 MG 24 hr capsule Take 1 capsule (180 mg total) by mouth 2 (two) times daily. 01/08/17  Yes Sherren Mocha, MD  etanercept (ENBREL SURECLICK) 50 MG/ML injection Inject 50 mg into the skin as directed. Inject on Sundays & Wednesdays    Yes [provider]  Flaxseed, Linseed, (FLAX SEED OIL) 1000 MG CAPS Take 1 capsule by mouth 2 (two) times daily.    Yes [provider]  Fluticasone-Umeclidin-Vilant (TRELEGY ELLIPTA) 100-62.5-25 MCG/INH AEPB Inhale 1 puff into the lungs daily. 10/14/17  Yes Hoyt Koch, MD  furosemide (LASIX) 80 MG tablet Take 1 tablet (80 mg total) by mouth 2 (two) times daily. 12/30/17  Yes Sherren Mocha, MD  Insulin Glargine (LANTUS SOLOSTAR) 100 UNIT/ML Solostar Pen Inject 30 Units into the skin daily at 10 pm. 12/08/17  Yes Elayne Snare, MD  ipratropium-albuterol (DUONEB) 0.5-2.5 (3) MG/3ML SOLN Take 3 mLs by nebulization every 4 (four) hours as needed. 12/29/17  Yes Hosie Poisson, MD  liraglutide (VICTOZA) 18 MG/3ML SOPN INJECT 0.3ML (=1.8MG)      SUBCUTANEOUSLY DAILY 07/26/17  Yes Elayne Snare, MD  losartan  (COZAAR) 25 MG tablet TAKE 1 TABLET BY MOUTH ONCE DAILY 12/23/17  Yes Sherren Mocha, MD  metFORMIN (GLUCOPHAGE) 1000 MG tablet TAKE 1 TABLET BY MOUTH TWICE DAILY WITH FOOD 12/20/17  Yes Elayne Snare, MD  metoprolol tartrate (LOPRESSOR) 100 MG tablet take1 tablet by mouth every morning and take 1 and 1/2 tablets by mouth every evening 08/03/17  Yes Sherren Mocha, MD  Multiple Vitamins-Minerals (CENTRUM ADULTS) TABS Take 1 tablet by mouth daily.   Yes [provider]  mupirocin ointment (BACTROBAN) 2 % Apply 1 application topically 2 (two) times daily as needed (skin).    Yes [provider]  nicotine (NICODERM CQ - DOSED IN MG/24 HOURS) 21 mg/24hr patch Place 1 patch (21 mg total) onto the skin daily. 12/30/17  Yes Hosie Poisson, MD  Nintedanib (OFEV) 150 MG CAPS Take 1 capsule (150 mg total) by mouth 2 (two) times daily. 10/29/17  Yes Rigoberto Noel, MD  Omega-3 Fatty Acids (FISH OIL) 1000  MG CAPS Take 1 capsule by mouth 2 (two) times daily.    Yes [provider]  potassium chloride SA (K-DUR,KLOR-CON) 20 MEQ tablet take 1 tablet by mouth twice a day 04/22/17  Yes Imogene Burn, PA-C  tamsulosin (FLOMAX) 0.4 MG CAPS capsule Take 0.4 mg by mouth daily.    Yes [provider]  triamcinolone cream (KENALOG) 0.1 % Apply 1 application topically 2 (two) times daily as needed (affected area/skin).    Yes [provider]  XARELTO 20 MG TABS tablet TAKE 1 TABLET BY MOUTH ONCE DAILY 01/17/18  Yes Sherren Mocha, MD  guaiFENesin (MUCINEX) 600 MG 12 hr tablet Take 1 tablet (600 mg total) by mouth 2 (two) times daily. Patient not taking: Reported on 01/11/2018 12/29/17   Hosie Poisson, MD  NOVOFINE 32G X 6 MM MISC use twice a day 03/22/17   Elayne Snare, MD  [facility] DELICA LANCETS FINE MISC TEST BLOOD SUGAR DAILY 03/22/17   Elayne Snare, MD  predniSONE (DELTASONE) 20 MG tablet Prednisone 60 mg daily for 2 days followed by  Prednisone 40 mg daily for 3 days followed by    Prednisone 20 mg daily for 3 days . Patient not taking: Reported on 01/24/2018 12/29/17   Hosie Poisson, MD  senna-docusate (SENOKOT-S) 8.6-50 MG tablet Take 1 tablet by mouth at bedtime as needed for mild constipation. Patient not taking: Reported on 01/11/2018 12/29/17   Hosie Poisson, MD    Family History Family History  Problem Relation Age of Onset  . Stroke Mother 7  . Hypertension Mother   . Diabetes Mother   . Diabetes Paternal Grandmother   . Lung cancer Father        smoker  . Diabetes Brother   . Heart attack Brother 73  . Diabetes Brother   . Hepatitis C Brother   . Throat cancer Paternal Uncle        1/2 uncle  . Heart attack Brother 3  . Crohn's disease Son   . Colon cancer Neg Hx   . Esophageal cancer Neg Hx   . Rectal cancer Neg Hx   . Stomach cancer Neg Hx     Social History Social History   Tobacco Use  . Smoking status: Current Every Day Smoker    Packs/day: 1.00    Years: 44.00    Pack years: 44.00    Types: Cigarettes  . Smokeless tobacco: Never Used  . Tobacco comment: started @ age 28, up to 2 ppd;1.25-1.5 ppd as of 11/23/13  Substance Use Topics  . Alcohol use: No    Alcohol/week: 0.0 oz    Comment:  11/23/13 "2-3 drinks per year"  . Drug use: No     Allergies   Niacin   Review of Systems Review of Systems  All other systems reviewed and are negative.    Physical Exam Updated Vital Signs BP 110/90   Pulse (!) 122   Temp 98.5 F (36.9 C) (Oral)   Resp 20   Ht 1.803 m (_0 )   Wt 122.5 kg (270 lb)   SpO2 (!) 85%   BMI 37.66 kg/m   Physical Exam  Constitutional:  Non-toxic appearance.  HENT:  Head: Normocephalic and atraumatic.  Right Ear: External ear normal.  Left Ear: External ear normal.  Eyes: Conjunctivae are normal. Right eye exhibits no discharge. Left eye exhibits no discharge. No scleral icterus.  Neck: Neck supple. No tracheal deviation present.  Cardiovascular: Normal rate, regular rhythm and  intact distal  pulses.  Pulmonary/Chest: Effort normal. No stridor. No respiratory distress. He has decreased breath sounds. He has no wheezes. He has no rales.  Abdominal: Soft. Bowel sounds are normal. He exhibits no distension. There is no tenderness. There is no rebound and no guarding.  Musculoskeletal: He exhibits no tenderness.       Right lower leg: He exhibits edema.       Left lower leg: He exhibits edema.  Neurological: He is alert. He has normal strength. No sensory deficit. Cranial nerve deficit: no gross deficits. He exhibits normal muscle tone. He displays no seizure activity. Coordination normal.  Skin: Skin is warm and dry. No rash noted.  Psychiatric: He has a normal mood and affect.  Nursing note and vitals reviewed.    ED Treatments / Results  Labs (all labs ordered are listed, but only abnormal results are displayed) Labs Reviewed  BRAIN NATRIURETIC PEPTIDE - Abnormal; Notable for the following components:      Result Value   B Natriuretic Peptide 139.7 (*)    All other components within normal limits  I-STAT CG4 LACTIC ACID, ED - Abnormal; Notable for the following components:   Lactic Acid, Venous 2.06 (*)    All other components within normal limits  I-STAT CG4 LACTIC ACID, ED - Abnormal; Notable for the following components:   Lactic Acid, Venous 3.47 (*)    All other components within normal limits  CULTURE, BLOOD (ROUTINE X 2)  CULTURE, BLOOD (ROUTINE X 2)  CBC  COMPREHENSIVE METABOLIC PANEL  TROPONIN I  BLOOD GAS, ARTERIAL    EKG EKG Interpretation  Date/Time:  Monday Jan 24 2018 17:45:58 EDT Ventricular Rate:  88 PR Interval:    QRS Duration: 102 QT Interval:  375 QTC Calculation: 454 R Axis:   39 Text Interpretation:  Atrial fibrillation Abnormal R-wave progression, early transition Baseline wander in lead(s) V3 V4 No significant change since last tracing Confirmed by Dorie Rank [phone]) on 01/24/2018 5:49:00 PM   Radiology Dg Chest 2 View  Result Date:  01/24/2018 CLINICAL DATA:  Shortness of breath.  Lung cancer.  COPD. EXAM: CHEST - 2 VIEW COMPARISON:  01/05/2018. FINDINGS: Marked worsening of BILATERAL pulmonary opacities, particularly on the RIGHT, with consolidation, atelectasis, and effusion. Asymmetric interstitial like opacity at the LEFT base is increased, without effusion or significant volume loss. Cardiomegaly. Thoracic atherosclerosis. IMPRESSION: Marked worsening aeration, with significant increase in RIGHT lung opacity, likely combination of atelectasis, infiltrate, and effusion, likely worsening metastatic disease. Electronically Signed   By: Staci Righter M.D.   On: 01/24/2018 18:44    Procedures .Critical Care Performed by: Dorie Rank, MD Authorized by: Dorie Rank, MD   Critical care provider statement:    Critical care time (minutes):  45   Critical care was time spent personally by me on the following activities:  Discussions with consultants, evaluation of patient's response to treatment, examination of patient, ordering and performing treatments and interventions, ordering and review of laboratory studies, ordering and review of radiographic studies, pulse oximetry, re-evaluation of patient's condition, obtaining history from patient or surrogate and review of old charts   (including critical care time)  Medications Ordered in ED Medications  sodium chloride 0.9 % bolus 500 mL (500 mLs Intravenous New Bag/Given 01/24/18 2116)    Followed by  0.9 %  sodium chloride infusion (1,000 mLs Intravenous New Bag/Given 01/24/18 2117)  sodium chloride 0.9 % bolus 1,000 mL (has no administration in time range)  And  sodium chloride 0.9 % bolus 1,000 mL (1,000 mLs Intravenous New Bag/Given 01/24/18 2142)    And  sodium chloride 0.9 % bolus 400 mL (has no administration in time range)  albuterol (PROVENTIL) (2.5 MG/3ML) 0.083% nebulizer solution 5 mg (5 mg Nebulization Given 01/24/18 2002)  ipratropium (ATROVENT) nebulizer solution 0.5 mg  (0.5 mg Nebulization Given 01/24/18 1920)  methylPREDNISolone sodium succinate (SOLU-MEDROL) 125 mg/2 mL injection 125 mg (125 mg Intravenous Given 01/24/18 1922)  cefTRIAXone (ROCEPHIN) 1 g in sodium chloride 0.9 % 100 mL IVPB (0 g Intravenous Stopped 01/24/18 2030)  azithromycin (ZITHROMAX) tablet 500 mg (500 mg Oral Given 01/24/18 1922)     Initial Impression / Assessment and Plan / ED Course  I have reviewed the triage vital signs and the nursing notes.  Pertinent labs & imaging results that were available during my care of the patient were reviewed by me and considered in my medical decision making (see chart for details).  Clinical Course as of Jan 25 2152  Mon Jan 24, 2018  2006 Chest x-ray shows worsening aeration in the right lung.  Possibly a combination of tumor, effusion and possibly infection   [JK]  2117 2nd lactic acid level increasing.  Will give fluid bolus.  No signs of chf on cxr.  Abx has been given.  BMET pending.  Plan on admission.   [JK]  2117 Tried bipap with pt's decreased 02 sat however he did not tolerate it.  Started pt on face mask oxygen and o2 sat is greater than 90 at the bedside.   [JK]    Clinical Course User Index [JK] Dorie Rank, MD    Patient presented to the emergency room for evaluation of increasing shortness of breath.  Patient unfortunately has a known history of recurrent lung cancer with malignant pleural effusion.  Patient continues to smoke and has a history of COPD as well as CHF.  Patient's evaluation today is suggestive of a recurrent COPD exacerbation as well as worsening pleural effusion.  Patient's x-ray suggest the possibility of pneumonia so he has been started on IV antibiotics.  Patient does have atrial fibrillation at times he is been tachycardic but this has most likely been related to his breathing treatments.  Patient does have increasing oxygen requirements.  He will need to be admitted to the hospital.  He will likely need a Pleurx  catheter as this has been a short time since the last time he has had his pleural effusion drained.  Final Clinical Impressions(s) / ED Diagnoses   Final diagnoses:  Malignant neoplasm of right lung, unspecified part of lung ([place])  Malignant pleural effusion  COPD exacerbation ([facility])      Dorie Rank, MD 01/24/18 2255

## 2018-01-25 ENCOUNTER — Ambulatory Visit: Payer: Medicare Other | Admitting: Physician Assistant

## 2018-01-25 DIAGNOSIS — I5033 Acute on chronic diastolic (congestive) heart failure: Secondary | ICD-10-CM

## 2018-01-25 DIAGNOSIS — E872 Acidosis, unspecified: Secondary | ICD-10-CM | POA: Diagnosis present

## 2018-01-25 DIAGNOSIS — I272 Pulmonary hypertension, unspecified: Secondary | ICD-10-CM | POA: Diagnosis present

## 2018-01-25 DIAGNOSIS — I2510 Atherosclerotic heart disease of native coronary artery without angina pectoris: Secondary | ICD-10-CM | POA: Diagnosis present

## 2018-01-25 DIAGNOSIS — I351 Nonrheumatic aortic (valve) insufficiency: Secondary | ICD-10-CM | POA: Diagnosis present

## 2018-01-25 DIAGNOSIS — I5032 Chronic diastolic (congestive) heart failure: Secondary | ICD-10-CM | POA: Diagnosis not present

## 2018-01-25 DIAGNOSIS — I4892 Unspecified atrial flutter: Secondary | ICD-10-CM | POA: Diagnosis present

## 2018-01-25 DIAGNOSIS — I361 Nonrheumatic tricuspid (valve) insufficiency: Secondary | ICD-10-CM | POA: Diagnosis not present

## 2018-01-25 DIAGNOSIS — J910 Malignant pleural effusion: Secondary | ICD-10-CM

## 2018-01-25 DIAGNOSIS — C3491 Malignant neoplasm of unspecified part of right bronchus or lung: Secondary | ICD-10-CM | POA: Diagnosis not present

## 2018-01-25 DIAGNOSIS — J189 Pneumonia, unspecified organism: Secondary | ICD-10-CM

## 2018-01-25 DIAGNOSIS — I481 Persistent atrial fibrillation: Secondary | ICD-10-CM | POA: Diagnosis not present

## 2018-01-25 DIAGNOSIS — E1151 Type 2 diabetes mellitus with diabetic peripheral angiopathy without gangrene: Secondary | ICD-10-CM | POA: Diagnosis present

## 2018-01-25 DIAGNOSIS — J9621 Acute and chronic respiratory failure with hypoxia: Secondary | ICD-10-CM | POA: Diagnosis present

## 2018-01-25 DIAGNOSIS — E1159 Type 2 diabetes mellitus with other circulatory complications: Secondary | ICD-10-CM | POA: Diagnosis not present

## 2018-01-25 DIAGNOSIS — G4733 Obstructive sleep apnea (adult) (pediatric): Secondary | ICD-10-CM | POA: Diagnosis present

## 2018-01-25 DIAGNOSIS — R042 Hemoptysis: Secondary | ICD-10-CM | POA: Diagnosis not present

## 2018-01-25 DIAGNOSIS — I25709 Atherosclerosis of coronary artery bypass graft(s), unspecified, with unspecified angina pectoris: Secondary | ICD-10-CM | POA: Diagnosis present

## 2018-01-25 DIAGNOSIS — J441 Chronic obstructive pulmonary disease with (acute) exacerbation: Secondary | ICD-10-CM | POA: Diagnosis not present

## 2018-01-25 DIAGNOSIS — E785 Hyperlipidemia, unspecified: Secondary | ICD-10-CM | POA: Diagnosis present

## 2018-01-25 DIAGNOSIS — C3411 Malignant neoplasm of upper lobe, right bronchus or lung: Secondary | ICD-10-CM | POA: Diagnosis not present

## 2018-01-25 DIAGNOSIS — J440 Chronic obstructive pulmonary disease with acute lower respiratory infection: Secondary | ICD-10-CM | POA: Diagnosis present

## 2018-01-25 DIAGNOSIS — C4490 Unspecified malignant neoplasm of skin, unspecified: Secondary | ICD-10-CM | POA: Diagnosis not present

## 2018-01-25 DIAGNOSIS — J90 Pleural effusion, not elsewhere classified: Secondary | ICD-10-CM | POA: Insufficient documentation

## 2018-01-25 DIAGNOSIS — I110 Hypertensive heart disease with heart failure: Secondary | ICD-10-CM | POA: Diagnosis not present

## 2018-01-25 DIAGNOSIS — Z7901 Long term (current) use of anticoagulants: Secondary | ICD-10-CM | POA: Diagnosis not present

## 2018-01-25 DIAGNOSIS — Z86010 Personal history of colonic polyps: Secondary | ICD-10-CM | POA: Diagnosis not present

## 2018-01-25 DIAGNOSIS — K219 Gastro-esophageal reflux disease without esophagitis: Secondary | ICD-10-CM | POA: Diagnosis present

## 2018-01-25 DIAGNOSIS — Z96643 Presence of artificial hip joint, bilateral: Secondary | ICD-10-CM | POA: Diagnosis present

## 2018-01-25 LAB — GLUCOSE, CAPILLARY
Glucose-Capillary: 170 mg/dL — ABNORMAL HIGH (ref 65–99)
Glucose-Capillary: 240 mg/dL — ABNORMAL HIGH (ref 65–99)

## 2018-01-25 LAB — TROPONIN I
Troponin I: <0.03 ng/mL (ref <0.03)
Troponin I: <0.03 ng/mL (ref <0.03)

## 2018-01-25 LAB — BRAIN NATRIURETIC PEPTIDE: B NATRIURETIC PEPTIDE 5: 134.1 pg/mL — AB (ref 0.0–100.0)

## 2018-01-25 LAB — URINALYSIS, ROUTINE W REFLEX MICROSCOPIC
Bilirubin Urine: NEGATIVE
Glucose, UA: NEGATIVE mg/dL
Hgb urine dipstick: NEGATIVE
Ketones, ur: NEGATIVE mg/dL
Leukocytes, UA: NEGATIVE
Nitrite: NEGATIVE
PH: 6 (ref 5.0–8.0)
Protein, ur: 100 mg/dL — AB
Specific Gravity, Urine: 1.019 (ref 1.005–1.030)

## 2018-01-25 LAB — CBC
HCT: 38.9 % — ABNORMAL LOW (ref 39.0–52.0)
HEMOGLOBIN: 12.4 g/dL — AB (ref 13.0–17.0)
MCH: 29.5 pg (ref 26.0–34.0)
MCHC: 31.9 g/dL (ref 30.0–36.0)
MCV: 92.6 fL (ref 78.0–100.0)
Platelets: 139 10*3/uL — ABNORMAL LOW (ref 150–400)
RBC: 4.2 MIL/uL — AB (ref 4.22–5.81)
RDW: 14.5 % (ref 11.5–15.5)
WBC: 5.2 10*3/uL (ref 4.0–10.5)

## 2018-01-25 LAB — BLOOD GAS, ARTERIAL
ACID-BASE EXCESS: 0.9 mmol/L (ref 0.0–2.0)
Bicarbonate: 24.9 mmol/L (ref 20.0–28.0)
DRAWN BY: 441261
O2 CONTENT: 15 L/min
O2 SAT: 98 %
PATIENT TEMPERATURE: 98.6
PCO2 ART: 39.4 mmHg (ref 32.0–48.0)
pH, Arterial: 7.417 (ref 7.350–7.450)
pO2, Arterial: 122 mmHg — ABNORMAL HIGH (ref 83.0–108.0)

## 2018-01-25 LAB — BASIC METABOLIC PANEL
Anion gap: 9 (ref 5–15)
BUN: 15 mg/dL (ref 6–20)
CO2: 22 mmol/L (ref 22–32)
CREATININE: 0.98 mg/dL (ref 0.61–1.24)
Calcium: 7.9 mg/dL — ABNORMAL LOW (ref 8.9–10.3)
Chloride: 107 mmol/L (ref 101–111)
GFR calc Af Amer: >60 mL/min (ref >60)
GFR calc non Af Amer: >60 mL/min (ref >60)
GLUCOSE: 205 mg/dL — AB (ref 65–99)
POTASSIUM: 5.4 mmol/L — AB (ref 3.5–5.1)
SODIUM: 138 mmol/L (ref 135–145)

## 2018-01-25 LAB — POTASSIUM: POTASSIUM: 4.7 mmol/L (ref 3.5–5.1)

## 2018-01-25 LAB — TSH: TSH: 0.292 u[IU]/mL — AB (ref 0.350–4.500)

## 2018-01-25 LAB — CBG MONITORING, ED
GLUCOSE-CAPILLARY: 194 mg/dL — AB (ref 65–99)
Glucose-Capillary: 228 mg/dL — ABNORMAL HIGH (ref 65–99)

## 2018-01-25 LAB — PROCALCITONIN: Procalcitonin: <0.1 ng/mL

## 2018-01-25 LAB — LACTIC ACID, PLASMA
LACTIC ACID, VENOUS: 2.3 mmol/L — AB (ref 0.5–1.9)
Lactic Acid, Venous: 1.6 mmol/L (ref 0.5–1.9)

## 2018-01-25 LAB — MRSA PCR SCREENING: MRSA BY PCR: NEGATIVE

## 2018-01-25 LAB — STREP PNEUMONIAE URINARY ANTIGEN: Strep Pneumo Urinary Antigen: NEGATIVE

## 2018-01-25 MED ORDER — SODIUM CHLORIDE 0.9 % IV SOLN
1.0000 g | Freq: Three times a day (TID) | INTRAVENOUS | Status: DC
  Administered 2018-01-25 – 2018-01-28 (×10): 1 g via INTRAVENOUS
  Filled 2018-01-25 (×12): qty 1

## 2018-01-25 MED ORDER — METOPROLOL TARTRATE 50 MG PO TABS
100.0000 mg | ORAL_TABLET | Freq: Every day | ORAL | Status: DC
  Administered 2018-01-25 – 2018-01-28 (×4): 100 mg via ORAL
  Filled 2018-01-25: qty 4
  Filled 2018-01-25: qty 2
  Filled 2018-01-25 (×2): qty 4

## 2018-01-25 MED ORDER — GUAIFENESIN ER 600 MG PO TB12
1200.0000 mg | ORAL_TABLET | Freq: Two times a day (BID) | ORAL | Status: DC
  Administered 2018-01-25 – 2018-01-28 (×7): 1200 mg via ORAL
  Filled 2018-01-25 (×7): qty 2

## 2018-01-25 MED ORDER — LIRAGLUTIDE 18 MG/3ML ~~LOC~~ SOPN
1.8000 mg | PEN_INJECTOR | Freq: Every morning | SUBCUTANEOUS | Status: DC
  Administered 2018-01-25: 1.8 mg via SUBCUTANEOUS

## 2018-01-25 MED ORDER — ARFORMOTEROL TARTRATE 15 MCG/2ML IN NEBU
15.0000 ug | INHALATION_SOLUTION | Freq: Two times a day (BID) | RESPIRATORY_TRACT | Status: DC
  Administered 2018-01-25 – 2018-01-28 (×7): 15 ug via RESPIRATORY_TRACT
  Filled 2018-01-25 (×11): qty 2

## 2018-01-25 MED ORDER — TAMSULOSIN HCL 0.4 MG PO CAPS
0.4000 mg | ORAL_CAPSULE | Freq: Every day | ORAL | Status: DC
  Administered 2018-01-25 – 2018-01-28 (×4): 0.4 mg via ORAL
  Filled 2018-01-25 (×4): qty 1

## 2018-01-25 MED ORDER — LORATADINE 10 MG PO TABS
10.0000 mg | ORAL_TABLET | Freq: Every day | ORAL | Status: DC
  Administered 2018-01-25 – 2018-01-28 (×4): 10 mg via ORAL
  Filled 2018-01-25 (×4): qty 1

## 2018-01-25 MED ORDER — RIVAROXABAN 20 MG PO TABS
20.0000 mg | ORAL_TABLET | Freq: Every day | ORAL | Status: DC
  Filled 2018-01-25: qty 1

## 2018-01-25 MED ORDER — VANCOMYCIN HCL 10 G IV SOLR
1250.0000 mg | Freq: Once | INTRAVENOUS | Status: DC
  Filled 2018-01-25: qty 1250

## 2018-01-25 MED ORDER — AZITHROMYCIN 250 MG PO TABS
500.0000 mg | ORAL_TABLET | Freq: Once | ORAL | Status: AC
  Administered 2018-01-25: 500 mg via ORAL
  Filled 2018-01-25: qty 2

## 2018-01-25 MED ORDER — ACETAMINOPHEN 650 MG RE SUPP: 650.0000 mg | Freq: Four times a day (QID) | RECTAL | Status: DC | PRN

## 2018-01-25 MED ORDER — LOSARTAN POTASSIUM 25 MG PO TABS
25.0000 mg | ORAL_TABLET | Freq: Every day | ORAL | Status: DC
  Filled 2018-01-25: qty 1

## 2018-01-25 MED ORDER — NICOTINE 21 MG/24HR TD PT24
21.0000 mg | MEDICATED_PATCH | Freq: Every day | TRANSDERMAL | Status: DC
  Administered 2018-01-25 – 2018-01-28 (×4): 21 mg via TRANSDERMAL
  Filled 2018-01-25 (×4): qty 1

## 2018-01-25 MED ORDER — VANCOMYCIN HCL 10 G IV SOLR
2500.0000 mg | Freq: Once | INTRAVENOUS | Status: AC
  Administered 2018-01-25: 2500 mg via INTRAVENOUS
  Filled 2018-01-25: qty 500

## 2018-01-25 MED ORDER — BUDESONIDE 0.5 MG/2ML IN SUSP
0.5000 mg | Freq: Two times a day (BID) | RESPIRATORY_TRACT | Status: DC
  Administered 2018-01-25 – 2018-01-28 (×7): 0.5 mg via RESPIRATORY_TRACT
  Filled 2018-01-25 (×10): qty 2

## 2018-01-25 MED ORDER — MUPIROCIN 2 % EX OINT
1.0000 "application " | TOPICAL_OINTMENT | Freq: Two times a day (BID) | CUTANEOUS | Status: DC | PRN
  Filled 2018-01-25: qty 22

## 2018-01-25 MED ORDER — ONDANSETRON HCL 4 MG/2ML IJ SOLN: 4.0000 mg | Freq: Four times a day (QID) | INTRAMUSCULAR | Status: DC | PRN

## 2018-01-25 MED ORDER — LEVALBUTEROL HCL 0.63 MG/3ML IN NEBU
0.6300 mg | INHALATION_SOLUTION | Freq: Four times a day (QID) | RESPIRATORY_TRACT | Status: DC
  Administered 2018-01-25 – 2018-01-28 (×12): 0.63 mg via RESPIRATORY_TRACT
  Filled 2018-01-25 (×13): qty 3

## 2018-01-25 MED ORDER — VANCOMYCIN HCL 10 G IV SOLR
1250.0000 mg | Freq: Two times a day (BID) | INTRAVENOUS | Status: DC
  Administered 2018-01-25: 1250 mg via INTRAVENOUS
  Filled 2018-01-25 (×2): qty 1250

## 2018-01-25 MED ORDER — INSULIN GLARGINE 100 UNIT/ML ~~LOC~~ SOLN
30.0000 [IU] | Freq: Every day | SUBCUTANEOUS | Status: DC
  Administered 2018-01-25 – 2018-01-27 (×2): 30 [IU] via SUBCUTANEOUS
  Filled 2018-01-25 (×2): qty 0.3

## 2018-01-25 MED ORDER — GUAIFENESIN ER 600 MG PO TB12
600.0000 mg | ORAL_TABLET | Freq: Two times a day (BID) | ORAL | Status: DC
  Administered 2018-01-25: 600 mg via ORAL
  Filled 2018-01-25: qty 1

## 2018-01-25 MED ORDER — LEVALBUTEROL HCL 0.63 MG/3ML IN NEBU: 0.6300 mg | INHALATION_SOLUTION | RESPIRATORY_TRACT | Status: DC | PRN

## 2018-01-25 MED ORDER — FUROSEMIDE 40 MG PO TABS
80.0000 mg | ORAL_TABLET | Freq: Two times a day (BID) | ORAL | Status: DC
  Administered 2018-01-25: 80 mg via ORAL
  Filled 2018-01-25: qty 2

## 2018-01-25 MED ORDER — METOPROLOL TARTRATE 50 MG PO TABS
150.0000 mg | ORAL_TABLET | Freq: Every day | ORAL | Status: DC
  Administered 2018-01-25 – 2018-01-27 (×4): 150 mg via ORAL
  Filled 2018-01-25 (×3): qty 6
  Filled 2018-01-25 (×4): qty 3

## 2018-01-25 MED ORDER — ETANERCEPT 50 MG/ML ~~LOC~~ SOAJ: 50.0000 mg | SUBCUTANEOUS | Status: DC

## 2018-01-25 MED ORDER — HYDROCODONE-HOMATROPINE 5-1.5 MG/5ML PO SYRP: 5.0000 mL | ORAL_SOLUTION | Freq: Four times a day (QID) | ORAL | Status: DC | PRN

## 2018-01-25 MED ORDER — FUROSEMIDE 10 MG/ML IJ SOLN
80.0000 mg | Freq: Two times a day (BID) | INTRAMUSCULAR | Status: DC
  Administered 2018-01-25 – 2018-01-27 (×4): 80 mg via INTRAVENOUS
  Filled 2018-01-25 (×4): qty 8

## 2018-01-25 MED ORDER — SODIUM CHLORIDE 0.9 % IV SOLN: 1.0000 g | INTRAVENOUS | Status: DC

## 2018-01-25 MED ORDER — INSULIN ASPART 100 UNIT/ML ~~LOC~~ SOLN
0.0000 [IU] | Freq: Three times a day (TID) | SUBCUTANEOUS | Status: DC
  Administered 2018-01-25 (×2): 3 [IU] via SUBCUTANEOUS
  Administered 2018-01-25: 5 [IU] via SUBCUTANEOUS
  Administered 2018-01-26: 3 [IU] via SUBCUTANEOUS
  Administered 2018-01-26 (×2): 5 [IU] via SUBCUTANEOUS
  Filled 2018-01-25 (×2): qty 1

## 2018-01-25 MED ORDER — POTASSIUM CHLORIDE CRYS ER 20 MEQ PO TBCR
20.0000 meq | EXTENDED_RELEASE_TABLET | Freq: Two times a day (BID) | ORAL | Status: DC
  Administered 2018-01-25: 20 meq via ORAL
  Filled 2018-01-25: qty 1

## 2018-01-25 MED ORDER — IPRATROPIUM BROMIDE 0.02 % IN SOLN: 0.5000 mg | RESPIRATORY_TRACT | Status: DC | PRN

## 2018-01-25 MED ORDER — ACETAMINOPHEN 325 MG PO TABS
650.0000 mg | ORAL_TABLET | Freq: Four times a day (QID) | ORAL | Status: DC | PRN
  Administered 2018-01-27: 650 mg via ORAL
  Filled 2018-01-25: qty 2

## 2018-01-25 MED ORDER — IPRATROPIUM BROMIDE 0.02 % IN SOLN
0.5000 mg | Freq: Four times a day (QID) | RESPIRATORY_TRACT | Status: DC
  Administered 2018-01-25 – 2018-01-28 (×12): 0.5 mg via RESPIRATORY_TRACT
  Filled 2018-01-25 (×14): qty 2.5

## 2018-01-25 MED ORDER — METHYLPREDNISOLONE SODIUM SUCC 125 MG IJ SOLR
60.0000 mg | Freq: Three times a day (TID) | INTRAMUSCULAR | Status: DC
  Administered 2018-01-25 – 2018-01-28 (×10): 60 mg via INTRAVENOUS
  Filled 2018-01-25 (×11): qty 2

## 2018-01-25 MED ORDER — PANTOPRAZOLE SODIUM 40 MG PO TBEC
40.0000 mg | DELAYED_RELEASE_TABLET | Freq: Every day | ORAL | Status: DC
  Administered 2018-01-25 – 2018-01-28 (×4): 40 mg via ORAL
  Filled 2018-01-25 (×4): qty 1

## 2018-01-25 MED ORDER — ONDANSETRON HCL 4 MG PO TABS: 4.0000 mg | ORAL_TABLET | Freq: Four times a day (QID) | ORAL | Status: DC | PRN

## 2018-01-25 MED ORDER — IPRATROPIUM-ALBUTEROL 0.5-2.5 (3) MG/3ML IN SOLN
3.0000 mL | Freq: Four times a day (QID) | RESPIRATORY_TRACT | Status: DC
  Filled 2018-01-25 (×2): qty 3

## 2018-01-25 MED ORDER — DILTIAZEM HCL ER COATED BEADS 180 MG PO CP24
180.0000 mg | ORAL_CAPSULE | Freq: Every day | ORAL | Status: DC
  Administered 2018-01-25 – 2018-01-28 (×4): 180 mg via ORAL
  Filled 2018-01-25 (×4): qty 1

## 2018-01-25 MED ORDER — ROSUVASTATIN CALCIUM 10 MG PO TABS
10.0000 mg | ORAL_TABLET | Freq: Every day | ORAL | Status: DC
  Administered 2018-01-25 – 2018-01-28 (×4): 10 mg via ORAL
  Filled 2018-01-25 (×4): qty 1

## 2018-01-25 MED ORDER — IPRATROPIUM-ALBUTEROL 0.5-2.5 (3) MG/3ML IN SOLN: 3.0000 mL | RESPIRATORY_TRACT | Status: DC | PRN

## 2018-01-25 NOTE — Progress Notes (Signed)
Inpatient Diabetes Program Recommendations  AACE/ADA: New Consensus Statement on Inpatient Glycemic Control (2015)  Target Ranges:  Prepandial:   less than 140 mg/dL      Peak postprandial:   less than 180 mg/dL (1-2 hours)      Critically ill patients:  140 - 180 mg/dL   Lab Results  Component Value Date   GLUCAP 228 (H) 01/25/2018   HGBA1C 7.3 (H) 12/08/2017    Review of Glycemic Control  Diabetes history: DM2 Outpatient Diabetes medications: Lantus 30 units QHS, metformin 1000 mg bid, Victoza 1.8 mg QD Current orders for Inpatient glycemic control: Lantus 30 units QHS, Novolog 0-15 units tidwc,   Inpatient Diabetes Program Recommendations:     May need increase in Novolog to 0-20 units tidwc and hs while on high-dose steroids.  Will follow glucose trends.   Thank you. Lorenda Peck, RD, LDN, CDE Inpatient Diabetes Coordinator [phone]

## 2018-01-25 NOTE — Progress Notes (Signed)
This Probation officer spoke with pt regarding use of bipap.  Pt adamantly refused stating he's told several people he will not wear it.  RN aware.

## 2018-01-25 NOTE — Progress Notes (Signed)
PROGRESS NOTE    Neil Carroll  YIF:[id number] DOB: [birth year] DOA: 01/24/2018 PCP: Hoyt Koch, MD   Brief Narrative:  Neil Housekeeper Sr. is a 67 y.o. male with medical history significant of A. Fib on Xarelto, diastolic CHF, squamous cell carcinoma right lung followed by Dr. 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  Recently admitted from 4/5-4/10; for shortness of breath found to have acute large right pleural effusion.  Patient underwent thoracentesis with improvement of symptoms.  He reports progressively worsening cough and shortness of breath symptoms on home oxygen settings despite utilizing inhalers.  He bumped his oxygen up to 5 L without much improvement.  Denies any complaints of fever, chills, chest pain, or diarrhea. Associated symptoms include chronic lower extremity swelling, orthopnea, intermittent nausea, and vomiting symptoms.  Reports having a follow-up with Dr. Earlie Server on Friday to reevaluate accumulation of fluid on his lung and need placement of a catheter.  ED Course: On admission to the emergency department patient was seen to be afebrile, pulse 75-1 33, respirations 19-22, blood pressure 94/58-120/75, O2 saturations 85% improved to 100% on partial rebreather mask.  Initial ABG revealed pH 7.439, PCO2 35.7,and PO2 140.  Labs revealed BNP 139.7 and lactic acid elevated up to 3.47.  CBC and CMP are relatively within normal limits.  Chest x-ray showing worsening aeration of the right lung field with possibility of infiltrate or effusion.  Patient was empirically given antibiotics of cefepime and vancomycin     Assessment & Plan:   Principal Problem:   Acute on chronic respiratory failure with hypoxia (HCC) Active Problems:   Malignant pleural effusion   Acute on chronic diastolic CHF (congestive heart failure) (HCC)   HCAP  (healthcare-associated pneumonia)   Postobstructive pneumonia: Probable   Type 2 diabetes mellitus with vascular disease ([place])   Hyperlipidemia   Hypertensive heart disease with CHF (congestive heart failure) (HCC)   PVD (peripheral vascular disease) with claudication ([place])   Coronary artery disease involving coronary bypass graft of native heart with angina pectoris (HCC)   Persistent atrial fibrillation (HCC)   Chronic anticoagulation-Xarelto (CHADs VASc=5)   OSA (obstructive sleep apnea)   Obesity (BMI 30-39.9)   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  Patient noted to have refused BiPAP overnight as he states the feeling of claustrophobia.  Patient recently admitted April 4 through April 10 secondary to an acute large right malignant pleural effusion as cytology from thoracentesis at that time was positive for malignant cells consistent with metastatic adenocarcinoma.  Patient presented back with worsening shortness of breath chest x-ray concerning for reaccumulation of pleural effusion.  Will have interventional radiology evaluate for therapeutic thoracentesis and evaluation for Pleurx catheter placement.  Patient noted to be significantly volume overloaded on examination and as such we will cycle cardiac enzymes every 6 hours x3.  Check a TSH.  Check a 2D echo.  Check ABG.  Check a urine strep pneumococcus antigen.  Check a urine Legionella antigen.  Blood cultures pending.  Placed on Lasix 80 mg IV every 12 hours.  Strict I's and O's.  Daily weights.  Change IV antibiotics from Rocephin to IV  cefepime and IV vancomycin for broader coverage.  Check a MRSA PCR.  Continue IV steroids.  Change albuterol nebs to Xopenex nebs secondary to tachycardia.  Continue scheduled Atrovent nebs, Brovana, Pulmicort.  Increase Mucinex to 1200 mg twice daily.  Hycodan as needed.  Add Claritin, Flonase, PPI.  Follow.  If pulmonary function worsens will need to consult with critical care for further evaluation and management.  2.  Recurrent malignant right pleural effusion Consult with IR for evaluation for therapeutic thoracentesis and Pleurx catheter placement.  Oncology has been informed via epic of patient's admission.  3.  Acute on chronic diastolic heart failure/coronary artery disease Patient with significant volume overload on examination with 3-4+ lower extremity edema extending up to his hips.  Patient had presented with worsening shortness of breath and also some orthopnea.  We will cycle cardiac enzymes every 6 hours x3.  Check a TSH.  Check a 2D echo.  Placed on Lasix 80 mg IV every 12 hours.  Strict I's and O's.  Daily weights.  Continue beta-blocker, statin and Xarelto.  4.  Persistent atrial fibrillation/atrial flutter CHA2DS2VASC score 5 Continue metoprolol for rate control and Cardizem.Alveda Reasons for anticoagulation.  5. Hyperlipidemia Continue statin.  6.  Type 2 diabetes mellitus Hemoglobin A1c was 7.3 on 12/08/2017.  CBG this morning is 194.  Tinea Lantus at 30 units daily.  Sliding scale insulin.  Disontinue Victoza.  7.  Tobacco abuse Tobacco cessation.  Continue nicotine patch.    8.  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 Lorna Few in the outpatient setting.  Oncologist has been added to the treatment team and notified via epic.  9.  Hypertension Stable.  Continue metoprolol and diuretics.  Discontinue losartan secondary to hyper kalemia   10.  Hyperkalemia Repeat potassium level.  Discontinue losartan.   DVT prophylaxis: Xarelto Code  Status: Full Family Communication: Updated patient.  No family at bedside. Disposition Plan: Awaiting stepdown bed.  Likely home once medically stable with clinical improvement.   Consultants:   None  Procedures:   Chest x-ray 01/24/2018    Antimicrobials:   IV Rocephin 01/24/2018>>>>> 01/25/2018  IV cefepime 01/25/2018  IV vancomycin 01/25/2018   Subjective: Patient sitting in chair on nonrebreather speaking in full sentences.  States some shortness of breath slightly improved since admission however not at baseline.  Denies any chest pain.  No abdominal pain.  Refusing BiPAP due to feeling of claustrophobia.  Objective: Vitals:   01/25/18 0630 01/25/18 0700 01/25/18 0743 01/25/18 0830  BP: 110/71  (!) 101/91 118/65  Pulse: 71 93 93 93  Resp: (!) 26 (!) 30 (!) 26 18  Temp:      TempSrc:      SpO2: 97% 95% 94% 92%  Weight:      Height:        Intake/Output Summary (Last 24 hours) at 01/25/2018 0959 Last data filed at 01/25/2018 0939 Gross per 24 hour  Intake 3300 ml  Output 240 ml  Net 3060 ml   Filed Weights   01/24/18 1732 01/24/18 2242  Weight: 122.5 kg (270 lb) 122.5 kg (270 lb)    Examination:  General exam: Appears calm and comfortable.  On nonrebreather. Respiratory system: On nonrebreather.  Some coarse diffuse breath sounds.  Poor to fair air movement.  Speaking in full sentences.   Respiratory effort normal. Cardiovascular system: Regularly  irregular.  No murmurs rubs or gallops.  3-4+ bilateral lower extremity edema up to hips.  Gastrointestinal system: Abdomen is nondistended, soft and nontender. No organomegaly or masses felt. Normal bowel sounds heard. Central nervous system: Alert and oriented. No focal neurological deficits. Extremities: Symmetric 5 x 5 power. Skin: Bilateral chronic venous stasis changes noted.  No rashes, lesions or ulcers Psychiatry: Judgement and insight appear normal. Mood & affect appropriate.     Data Reviewed: I have  personally reviewed following labs and imaging studies  CBC: Recent Labs  Lab 01/24/18 2002 01/25/18 0529  WBC 8.7 5.2  HGB 15.3 12.4*  HCT 47.2 38.9*  MCV 92.0 92.6  PLT 195 301*   Basic Metabolic Panel: Recent Labs  Lab 01/24/18 2047 01/25/18 0529  NA 141 138  K 4.0 5.4*  CL 100* 107  CO2 25 22  GLUCOSE 133* 205*  BUN 14 15  CREATININE 1.19 0.98  CALCIUM 9.3 7.9*   GFR: Estimated Creatinine Clearance: 97.5 mL/min (by C-G formula based on SCr of 0.98 mg/dL). Liver Function Tests: Recent Labs  Lab 01/24/18 2047  AST 25  ALT 28  ALKPHOS 67  BILITOT 0.6  PROT 8.3*  ALBUMIN 3.3*   No results for input(s): LIPASE, AMYLASE in the last 168 hours. No results for input(s): AMMONIA in the last 168 hours. Coagulation Profile: No results for input(s): INR, PROTIME in the last 168 hours. Cardiac Enzymes: Recent Labs  Lab 01/24/18 2047  TROPONINI <0.03   BNP (last 3 results) Recent Labs    10/13/17 1202 10/21/17 1203  PROBNP 992* 962*   HbA1C: No results for input(s): HGBA1C in the last 72 hours. CBG: Recent Labs  Lab 01/24/18 2354 01/25/18 0747  GLUCAP 168* 194*   Lipid Profile: No results for input(s): CHOL, HDL, LDLCALC, TRIG, CHOLHDL, LDLDIRECT in the last 72 hours. Thyroid Function Tests: No results for input(s): TSH, T4TOTAL, FREET4, T3FREE, THYROIDAB in the last 72 hours. Anemia Panel: No results for input(s): VITAMINB12, FOLATE, FERRITIN, TIBC, IRON, RETICCTPCT in the last 72 hours. Sepsis Labs: Recent Labs  Lab 01/24/18 1938 01/24/18 2113 01/25/18 0134 01/25/18 0529  PROCALCITON  --   --   --  <0.10  LATICACIDVEN 2.06* 3.47* 2.3* 1.6    No results found for this or any previous visit (from the past 240 hour(s)).       Radiology Studies: Dg Chest 2 View  Result Date: 01/24/2018 CLINICAL DATA:  Shortness of breath.  Lung cancer.  COPD. EXAM: CHEST - 2 VIEW COMPARISON:  01/05/2018. FINDINGS: Marked worsening of BILATERAL pulmonary  opacities, particularly on the RIGHT, with consolidation, atelectasis, and effusion. Asymmetric interstitial like opacity at the LEFT base is increased, without effusion or significant volume loss. Cardiomegaly. Thoracic atherosclerosis. IMPRESSION: Marked worsening aeration, with significant increase in RIGHT lung opacity, likely combination of atelectasis, infiltrate, and effusion, likely worsening metastatic disease. Electronically Signed   By: Staci Righter M.D.   On: 01/24/2018 18:44        Scheduled Meds: . arformoterol  15 mcg Nebulization BID  . azithromycin  500 mg Oral Once  . budesonide (PULMICORT) nebulizer solution  0.5 mg Nebulization BID  . diltiazem  180 mg Oral Daily  . furosemide  80 mg Intravenous Q12H  . guaiFENesin  1,200 mg Oral BID  . insulin aspart  0-15 Units Subcutaneous TID WC  . insulin glargine  30 Units Subcutaneous Q2200  . ipratropium  0.5 mg Nebulization Q6H  . levalbuterol  0.63  mg Nebulization Q6H  . liraglutide  1.8 mg Subcutaneous q morning - 10a  . loratadine  10 mg Oral Daily  . methylPREDNISolone (SOLU-MEDROL) injection  60 mg Intravenous Q8H  . metoprolol tartrate  100 mg Oral Daily  . metoprolol tartrate  150 mg Oral QHS  . nicotine  21 mg Transdermal Daily  . pantoprazole  40 mg Oral Q0600  . rivaroxaban  20 mg Oral Daily  . rosuvastatin  10 mg Oral Daily  . tamsulosin  0.4 mg Oral Daily   Continuous Infusions: . ceFEPime (MAXIPIME) IV    . vancomycin    . vancomycin       LOS: 0 days    Time spent: 45 minutes    Irine Seal, MD [facility] Pager [phone]  If 7PM-7AM, please contact night-coverage [email or web address] Password [email or web address] 01/25/2018, 9:59 AM

## 2018-01-25 NOTE — Progress Notes (Signed)
Pharmacy Antibiotic Note  Neil Carroll. is a 67 y.o. male admitted on 01/24/2018 with pneumonia.  Pharmacy has been consulted for vanc dosing.  Plan: 1) Vanc 2500mg  x 1 loading dose then 1250mg  IV q12 - goal AUC 400-500 2) Continue current cefepime dose  Height: 5\' 11"  (180.3 cm) Weight: 270 lb (122.5 kg) IBW/kg (Calculated) : 75.3  Temp (24hrs), Avg:98.5 F (36.9 C), Min:98.5 F (36.9 C), Max:98.5 F (36.9 C)  Recent Labs  Lab 01/24/18 1938 01/24/18 2002 01/24/18 2047 01/24/18 2113 01/25/18 0134 01/25/18 0529  WBC  --  8.7  --   --   --  5.2  CREATININE  --   --  1.19  --   --  0.98  LATICACIDVEN 2.06*  --   --  3.47* 2.3* 1.6    Estimated Creatinine Clearance: 97.5 mL/min (by C-G formula based on SCr of 0.98 mg/dL).    Allergies  Allergen Reactions  . Niacin Nausea Only    REACTION: upset stomach    Thank you for allowing pharmacy to be a part of this patient's care.  Adrian Saran, PharmD, BCPS Pager [phone] 01/25/2018 9:19 AM

## 2018-01-25 NOTE — ED Notes (Signed)
ED TO INPATIENT HANDOFF REPORT  Name/Age/Gender Neil Housekeeper Sr. 67 y.o. male  Code Status    Code Status Orders  (From admission, onward)        Start     Ordered   01/25/18 0008  Full code  Continuous     01/25/18 0009    Code Status History    Date Active Date Inactive Code Status Order ID Comments User Context   12/24/2017 1805 12/29/2017 1640 Full Code [id number]  Doreatha Lew, MD ED   02/25/2017 1157 02/27/2017 1951 Full Code [id number]  Saverio Danker, PA-C Inpatient   02/25/2016 1915 02/29/2016 1608 Full Code [id number]  Bonnell Public, MD Inpatient   02/25/2016 1915 02/25/2016 1915 Full Code [id number]  Bonnell Public, MD Inpatient   11/10/2015 1638 11/12/2015 1510 Full Code [id number]  Samella Parr, NP Inpatient   07/10/2015 1259 07/10/2015 1904 Full Code [id number]  Sherren Mocha, MD Inpatient      Home/SNF/Other Home  Chief Complaint shortness of breath , low energy,pt on H2O  Level of Care/Admitting Diagnosis ED Disposition    ED Disposition Condition [place]: [facility] [[id number]]  Level of Care: Stepdown [14]  Admit to SDU based on following criteria: Respiratory Distress:  Frequent assessment and/or intervention to maintain adequate ventilation/respiration, pulmonary toilet, and respiratory treatment.  Diagnosis: Acute on chronic respiratory failure with hypoxia [facility]) [[id number]]  Admitting Physician: Norval Morton [[id number]]  Attending Physician: Norval Morton [[id number]]  Estimated length of stay: past midnight tomorrow  Certification:: I certify this patient will need inpatient services for at least 2 midnights  PT Class (Do Not Modify): Inpatient [101]  PT Acc Code (Do Not Modify): Private [1]       Medical History Past Medical History:  Diagnosis Date  . Adenomatous colon polyp   . Aortic insufficiency    Echo 3/18: Severe concentric LVH, EF 60-65, normal wall motion, moderate AI, mild  LAE, mild TR // Echo 9/18: Mild concentric LVH, EF 60-65, normal wall motion, trivial AI, MAC, mild BAE  . Arthritis    left hip replacement  . Atrial flutter ([place])    onset  2011. s/p EPS/RFA 12/2011  . Cataract   . Chronic bronchitis   . Contact dermatitis and other eczema due to plants (except food)   . COPD (chronic obstructive pulmonary disease) ([place])   . GERD (gastroesophageal reflux disease)   . Hyperlipidemia   . Hypertension   . Lung cancer ([place])   . LV dysfunction    EF 45-50% 12/2011  . OSA (obstructive sleep apnea) 10/13/2016   Mild with AHI 9.7/hr with significant oxygen desaturations as low as 76% now on CPAP  . Other peripheral vascular disease(443.89)    bilateral lower extremity  . Tobacco use disorder    dependent  . Transient global amnesia   . Tuberculosis   . Type II diabetes mellitus (HCC)     Allergies Allergies  Allergen Reactions  . Niacin Nausea Only    REACTION: upset stomach    IV Location/Drains/Wounds Patient Lines/Drains/Airways Status   Active Line/Drains/Airways    Name:   Placement date:   Placement time:   Site:   Days:   Peripheral IV 01/24/18 Right Hand   01/24/18    2000    Hand   1   Peripheral IV 01/24/18 Left Antecubital   01/24/18    1943    Antecubital   1  Chest Tube 1 Right Pleural 14 Fr.   02/25/17    0910    Pleural   334   Closed System Drain   --    --    --      Wound / Incision (Open or Dehisced) 12/25/17 Other (Comment) Leg Left blister open and fluilled filled unopened blisters, weeping   12/25/17    2330    Leg   31          Labs/Imaging Results for orders placed or performed during the hospital encounter of 01/24/18 (from the past 48 hour(s))  I-Stat CG4 Lactic Acid, ED     Status: Abnormal   Collection Time: 01/24/18  7:38 PM  Result Value Ref Range   Lactic Acid, Venous 2.06 (HH) 0.5 - 1.9 mmol/L   Comment NOTIFIED PHYSICIAN   CBC     Status: None   Collection Time: 01/24/18  8:02 PM  Result Value Ref Range    WBC 8.7 4.0 - 10.5 K/uL   RBC 5.13 4.22 - 5.81 MIL/uL   Hemoglobin 15.3 13.0 - 17.0 g/dL   HCT 47.2 39.0 - 52.0 %   MCV 92.0 78.0 - 100.0 fL   MCH 29.8 26.0 - 34.0 pg   MCHC 32.4 30.0 - 36.0 g/dL   RDW 14.3 11.5 - 15.5 %   Platelets 195 150 - 400 K/uL    Comment: Performed at [facility], [place] [address] [place], [place] [address]  Brain natriuretic peptide     Status: Abnormal   Collection Time: 01/24/18  8:02 PM  Result Value Ref Range   B Natriuretic Peptide 139.7 (H) 0.0 - 100.0 pg/mL    Comment: Performed at [facility], [place] [address] [place], [place] [address]  Comprehensive metabolic panel     Status: Abnormal   Collection Time: 01/24/18  8:47 PM  Result Value Ref Range   Sodium 141 135 - 145 mmol/L   Potassium 4.0 3.5 - 5.1 mmol/L   Chloride 100 (L) 101 - 111 mmol/L   CO2 25 22 - 32 mmol/L   Glucose, Bld 133 (H) 65 - 99 mg/dL   BUN 14 6 - 20 mg/dL   Creatinine, Ser 1.19 0.61 - 1.24 mg/dL   Calcium 9.3 8.9 - 10.3 mg/dL   Total Protein 8.3 (H) 6.5 - 8.1 g/dL   Albumin 3.3 (L) 3.5 - 5.0 g/dL   AST 25 15 - 41 U/L   ALT 28 17 - 63 U/L   Alkaline Phosphatase 67 38 - 126 U/L   Total Bilirubin 0.6 0.3 - 1.2 mg/dL   GFR calc non Af Amer >60 >60 mL/min   GFR calc Af Amer >60 >60 mL/min    Comment: (NOTE) The eGFR has been calculated using the CKD EPI equation. This calculation has not been validated in all clinical situations. eGFR's persistently <60 mL/min signify possible Chronic Kidney Disease.    Anion gap 16 (H) 5 - 15    Comment: Performed at [facility], [place] [address] [place], [place] [address]  Troponin I     Status: None   Collection Time: 01/24/18  8:47 PM  Result Value Ref Range   Troponin I <0.03 <0.03 ng/mL    Comment: Performed at [facility], [place] [address] [place],  [address]  I-Stat CG4 Lactic Acid, ED     Status: Abnormal   Collection Time: 01/24/18  9:13 PM   Result Value  Ref Range   Lactic Acid, Venous 3.47 (HH) 0.5 - 1.9 mmol/L   Comment NOTIFIED PHYSICIAN   Blood gas, arterial     Status: Abnormal   Collection Time: 01/24/18 10:02 PM  Result Value Ref Range   FIO2 100.00    O2 Content 15.0 L/min   Delivery systems NON-REBREATHER OXYGEN MASK    pH, Arterial 7.439 7.350 - 7.450   pCO2 arterial 35.7 32.0 - 48.0 mmHg   pO2, Arterial 140 (H) 83.0 - 108.0 mmHg   Bicarbonate 23.8 20.0 - 28.0 mmol/L   Acid-Base Excess 0.5 0.0 - 2.0 mmol/L   O2 Saturation 98.4 %   Patient temperature 98.6    Collection site RIGHT RADIAL    Drawn by [id number]    Sample type ARTERIAL DRAW    Allens test (pass/fail) PASS PASS    Comment: Performed at [facility], [place] [address] [place], [place] [address]  CBG monitoring, ED     Status: Abnormal   Collection Time: 01/24/18 11:54 PM  Result Value Ref Range   Glucose-Capillary 168 (H) 65 - 99 mg/dL  Lactic acid, plasma     Status: Abnormal   Collection Time: 01/25/18  1:34 AM  Result Value Ref Range   Lactic Acid, Venous 2.3 (HH) 0.5 - 1.9 mmol/L    Comment: CRITICAL RESULT CALLED TO, READ BACK BY AND VERIFIED WITH: T DOSTER RN 01/25/18 0236 A NAVARRO Performed at [facility], [place] [address] [place], [place] [address]   CBC     Status: Abnormal   Collection Time: 01/25/18  5:29 AM  Result Value Ref Range   WBC 5.2 4.0 - 10.5 K/uL   RBC 4.20 (L) 4.22 - 5.81 MIL/uL   Hemoglobin 12.4 (L) 13.0 - 17.0 g/dL   HCT 38.9 (L) 39.0 - 52.0 %   MCV 92.6 78.0 - 100.0 fL   MCH 29.5 26.0 - 34.0 pg   MCHC 31.9 30.0 - 36.0 g/dL   RDW 14.5 11.5 - 15.5 %   Platelets 139 (L) 150 - 400 K/uL    Comment: Performed at [facility], [place] [address] [place], [place] [address]  Basic metabolic panel     Status: Abnormal   Collection Time: 01/25/18  5:29 AM  Result Value Ref Range   Sodium 138 135 - 145 mmol/L   Potassium 5.4 (H) 3.5 - 5.1 mmol/L    Comment: SLIGHT  HEMOLYSIS DELTA CHECK NOTED    Chloride 107 101 - 111 mmol/L   CO2 22 22 - 32 mmol/L   Glucose, Bld 205 (H) 65 - 99 mg/dL   BUN 15 6 - 20 mg/dL   Creatinine, Ser 0.98 0.61 - 1.24 mg/dL   Calcium 7.9 (L) 8.9 - 10.3 mg/dL   GFR calc non Af Amer >60 >60 mL/min   GFR calc Af Amer >60 >60 mL/min    Comment: (NOTE) The eGFR has been calculated using the CKD EPI equation. This calculation has not been validated in all clinical situations. eGFR's persistently <60 mL/min signify possible Chronic Kidney Disease.    Anion gap 9 5 - 15    Comment: Performed at [facility], [place] [address] [place], [place] [address]  Lactic acid, plasma     Status: None   Collection Time: 01/25/18  5:29 AM  Result Value Ref Range   Lactic Acid, Venous 1.6 0.5 - 1.9 mmol/L    Comment: Performed at [facility], [address] [place], [place] [address]  Procalcitonin     Status: None   Collection Time: 01/25/18  5:29 AM  Result Value Ref Range   Procalcitonin <0.10 ng/mL    Comment:        Interpretation: PCT (Procalcitonin) <= 0.5 ng/mL: Systemic infection (sepsis) is not likely. Local bacterial infection is possible. (NOTE)       Sepsis PCT Algorithm           Lower Respiratory Tract                                      Infection PCT Algorithm    ----------------------------     ----------------------------         PCT < 0.25 ng/mL                PCT < 0.10 ng/mL         Strongly encourage             Strongly discourage   discontinuation of antibiotics    initiation of antibiotics    ----------------------------     -----------------------------       PCT 0.25 - 0.50 ng/mL            PCT 0.10 - 0.25 ng/mL               OR       >80% decrease in PCT            Discourage initiation of                                            antibiotics      Encourage discontinuation           of antibiotics    ----------------------------      -----------------------------         PCT >= 0.50 ng/mL              PCT 0.26 - 0.50 ng/mL               AND        <80% decrease in PCT             Encourage initiation of                                             antibiotics       Encourage continuation           of antibiotics    ----------------------------     -----------------------------        PCT >= 0.50 ng/mL                  PCT > 0.50 ng/mL               AND         increase in PCT                  Strongly encourage  initiation of antibiotics    Strongly encourage escalation           of antibiotics                                     -----------------------------                                           PCT <= 0.25 ng/mL                                                 OR                                        > 80% decrease in PCT                                     Discontinue / Do not initiate                                             antibiotics Performed at [place] [address] [place], [place] [address]   Brain natriuretic peptide     Status: Abnormal   Collection Time: 01/25/18  5:29 AM  Result Value Ref Range   B Natriuretic Peptide 134.1 (H) 0.0 - 100.0 pg/mL    Comment: Performed at [facility], [place] [address], [place], [place] [address]  CBG monitoring, ED     Status: Abnormal   Collection Time: 01/25/18  7:47 AM  Result Value Ref Range   Glucose-Capillary 194 (H) 65 - 99 mg/dL  Urinalysis, Routine w reflex microscopic     Status: Abnormal   Collection Time: 01/25/18  9:14 AM  Result Value Ref Range   Color, Urine YELLOW YELLOW   APPearance CLEAR CLEAR   Specific Gravity, Urine 1.019 1.005 - 1.030   pH 6.0 5.0 - 8.0   Glucose, UA NEGATIVE NEGATIVE mg/dL   Hgb urine dipstick NEGATIVE NEGATIVE   Bilirubin Urine NEGATIVE NEGATIVE   Ketones, ur NEGATIVE NEGATIVE mg/dL   Protein, ur 100 (A) NEGATIVE mg/dL   Nitrite  NEGATIVE NEGATIVE   Leukocytes, UA NEGATIVE NEGATIVE   RBC / HPF 0-5 0 - 5 RBC/hpf   WBC, UA 0-5 0 - 5 WBC/hpf   Bacteria, UA RARE (A) NONE SEEN   Squamous Epithelial / LPF 0-5 0 - 5    Comment: Please note change in reference range.   Mucus PRESENT    Hyaline Casts, UA PRESENT     Comment: Performed at [facility], [place] [address], [place], [place] [address]  Strep pneumoniae urinary antigen     Status: None   Collection Time: 01/25/18  9:14 AM  Result Value Ref Range   Strep Pneumo Urinary Antigen NEGATIVE NEGATIVE    Comment:        Infection due  to S. pneumoniae cannot be absolutely ruled out since the antigen present may be below the detection limit of the test. Performed at [facility], [address] [place], [place] [address]   Blood gas, arterial     Status: Abnormal   Collection Time: 01/25/18  9:44 AM  Result Value Ref Range   O2 Content 15.0 L/min   Delivery systems NON-REBREATHER OXYGEN MASK    pH, Arterial 7.417 7.350 - 7.450   pCO2 arterial 39.4 32.0 - 48.0 mmHg   pO2, Arterial 122 (H) 83.0 - 108.0 mmHg   Bicarbonate 24.9 20.0 - 28.0 mmol/L   Acid-Base Excess 0.9 0.0 - 2.0 mmol/L   O2 Saturation 98.0 %   Patient temperature 98.6    Collection site RIGHT RADIAL    Drawn by [id number]    Sample type ARTERIAL DRAW    Allens test (pass/fail) PASS PASS    Comment: Performed at [facility], [place] [address], [place], [place] [address]  Potassium     Status: None   Collection Time: 01/25/18 10:22 AM  Result Value Ref Range   Potassium 4.7 3.5 - 5.1 mmol/L    Comment: Performed at [facility], [place] [address], [place], [place] [address]  Troponin I (q 6hr x 3)     Status: None   Collection Time: 01/25/18 10:22 AM  Result Value Ref Range   Troponin I <0.03 <0.03 ng/mL    Comment: Performed at [facility], [place] [address] [place], [place] [address]  TSH     Status: Abnormal    Collection Time: 01/25/18 10:22 AM  Result Value Ref Range   TSH 0.292 (L) 0.350 - 4.500 uIU/mL    Comment: Performed by a 3rd Generation assay with a functional sensitivity of <=0.01 uIU/mL. Performed at [facility], [place] [address], [place], [place] [address]   CBG monitoring, ED     Status: Abnormal   Collection Time: 01/25/18 12:25 PM  Result Value Ref Range   Glucose-Capillary 228 (H) 65 - 99 mg/dL   Dg Chest 2 View  Result Date: 01/24/2018 CLINICAL DATA:  Shortness of breath.  Lung cancer.  COPD. EXAM: CHEST - 2 VIEW COMPARISON:  01/05/2018. FINDINGS: Marked worsening of BILATERAL pulmonary opacities, particularly on the RIGHT, with consolidation, atelectasis, and effusion. Asymmetric interstitial like opacity at the LEFT base is increased, without effusion or significant volume loss. Cardiomegaly. Thoracic atherosclerosis. IMPRESSION: Marked worsening aeration, with significant increase in RIGHT lung opacity, likely combination of atelectasis, infiltrate, and effusion, likely worsening metastatic disease. Electronically Signed   By: Staci Righter M.D.   On: 01/24/2018 18:44    Pending Labs Unresulted Labs (From admission, onward)   Start     Ordered   01/26/18 0500  CBC with Differential/Platelet  Tomorrow morning,   R     01/25/18 1015   01/26/18 7616  Basic metabolic panel  Tomorrow morning,   R     01/25/18 1015   01/26/18 0500  Magnesium  Tomorrow morning,   R     01/25/18 1015   01/25/18 0932  Troponin I (q 6hr x 3)  Now then every 6 hours,   R     01/25/18 0931   01/25/18 0855  HIV antibody (Routine Screening)  Once,   R     01/25/18 0854   01/25/18 0855  Culture, sputum-assessment  Once,   R     01/25/18 0737  01/25/18 0855  Gram stain  Once,   R     01/25/18 0854   01/25/18 0853  Legionella Pneumophila Serogp 1 Ur Ag  Once,   R     01/25/18 0852   01/25/18 0852  Culture, Urine  Once,   R     01/25/18 0851   01/25/18 0850  MRSA PCR Screening   Once,   R     01/25/18 0849   01/24/18 1858  Blood culture (routine x 2)  BLOOD CULTURE X 2,   STAT     01/24/18 1906      Vitals/Pain Today's Vitals   01/25/18 1130 01/25/18 1230 01/25/18 1325 01/25/18 1330  BP: 118/74 105/66  106/66  Pulse: (!) 55 95    Resp: (!) 21 20  (!) 25  Temp:      TempSrc:      SpO2: 92% 92% 99%   Weight:      Height:      PainSc:        Isolation Precautions No active isolations  Medications Medications  rosuvastatin (CRESTOR) tablet 10 mg (10 mg Oral Given 01/25/18 1009)  diltiazem (CARDIZEM CD) 24 hr capsule 180 mg (180 mg Oral Given 01/25/18 1007)  arformoterol (BROVANA) nebulizer solution 15 mcg (15 mcg Nebulization Not Given 01/25/18 0814)  budesonide (PULMICORT) nebulizer solution 0.5 mg (0.5 mg Nebulization Not Given 01/25/18 0819)  insulin glargine (LANTUS) injection 30 Units (has no administration in time range)  metoprolol tartrate (LOPRESSOR) tablet 150 mg (150 mg Oral Given 01/25/18 0102)  mupirocin ointment (BACTROBAN) 2 % 1 application (has no administration in time range)  tamsulosin (FLOMAX) capsule 0.4 mg (0.4 mg Oral Given 01/25/18 1006)  rivaroxaban (XARELTO) tablet 20 mg (0 mg Oral Hold 01/25/18 0945)  nicotine (NICODERM CQ - dosed in mg/24 hours) patch 21 mg (21 mg Transdermal Patch Applied 01/25/18 1023)  methylPREDNISolone sodium succinate (SOLU-MEDROL) 125 mg/2 mL injection 60 mg (60 mg Intravenous Given 01/25/18 0622)  ondansetron (ZOFRAN) tablet 4 mg (has no administration in time range)    Or  ondansetron (ZOFRAN) injection 4 mg (has no administration in time range)  acetaminophen (TYLENOL) tablet 650 mg (has no administration in time range)    Or  acetaminophen (TYLENOL) suppository 650 mg (has no administration in time range)  insulin aspart (novoLOG) injection 0-15 Units (5 Units Subcutaneous Given 01/25/18 1238)  metoprolol tartrate (LOPRESSOR) tablet 100 mg (100 mg Oral Given 01/25/18 1025)  azithromycin (ZITHROMAX) tablet 500 mg (has  no administration in time range)  ceFEPIme (MAXIPIME) 1 g in sodium chloride 0.9 % 100 mL IVPB (0 g Intravenous Stopped 01/25/18 1050)  guaiFENesin (MUCINEX) 12 hr tablet 1,200 mg (1,200 mg Oral Given 01/25/18 1006)  HYDROcodone-homatropine (HYCODAN) 5-1.5 MG/5ML syrup 5 mL (has no administration in time range)  pantoprazole (PROTONIX) EC tablet 40 mg (40 mg Oral Given 01/25/18 1007)  loratadine (CLARITIN) tablet 10 mg (10 mg Oral Given 01/25/18 1008)  vancomycin (VANCOCIN) 1,250 mg in sodium chloride 0.9 % 250 mL IVPB (has no administration in time range)  furosemide (LASIX) injection 80 mg (80 mg Intravenous Given 01/25/18 1010)  levalbuterol (XOPENEX) nebulizer solution 0.63 mg (0.63 mg Nebulization Given 01/25/18 1345)  ipratropium (ATROVENT) nebulizer solution 0.5 mg (0.5 mg Nebulization Given 01/25/18 1345)  levalbuterol (XOPENEX) nebulizer solution 0.63 mg (has no administration in time range)  ipratropium (ATROVENT) nebulizer solution 0.5 mg (has no administration in time range)  albuterol (PROVENTIL) (2.5 MG/3ML) 0.083% nebulizer solution 5  mg (5 mg Nebulization Given 01/24/18 2002)  ipratropium (ATROVENT) nebulizer solution 0.5 mg (0.5 mg Nebulization Given 01/24/18 1920)  methylPREDNISolone sodium succinate (SOLU-MEDROL) 125 mg/2 mL injection 125 mg (125 mg Intravenous Given 01/24/18 1922)  cefTRIAXone (ROCEPHIN) 1 g in sodium chloride 0.9 % 100 mL IVPB (0 g Intravenous Stopped 01/24/18 2030)  azithromycin (ZITHROMAX) tablet 500 mg (500 mg Oral Given 01/24/18 1922)  sodium chloride 0.9 % bolus 500 mL (0 mLs Intravenous Stopped 01/24/18 2213)  sodium chloride 0.9 % bolus 1,000 mL (0 mLs Intravenous Stopped 01/24/18 2303)    And  sodium chloride 0.9 % bolus 1,000 mL (0 mLs Intravenous Stopped 01/24/18 2249)    And  sodium chloride 0.9 % bolus 400 mL (0 mLs Intravenous Stopped 01/24/18 2315)  vancomycin (VANCOCIN) 2,500 mg in sodium chloride 0.9 % 500 mL IVPB (0 mg Intravenous Stopped 01/25/18 1306)     Mobility walks with person assist

## 2018-01-25 NOTE — ED Notes (Signed)
Pt is breathing very hard and coughing regularly. Oxygen saturations are running around 94% on nonrebreather at full strength. RN attempted to discuss BiPap with pt and pt became very agitated and told RN "I might need it but I ain't wearing it." RN verbalized to patient that if patient's oxygen saturation continues to decrease and work of breathing increases he may be intubated as a result.  Pt stated "Then it will be what it will be" RN told pt "That is your decision sir."  Pt eating breakfast now.

## 2018-01-25 NOTE — Progress Notes (Signed)
Patient on NRB and labored at rest. Requesting to eat breakfast at this time.

## 2018-01-25 NOTE — ED Notes (Signed)
Pt refused BiPap, said he couldn't handle it, was put back on non rebreather

## 2018-01-25 NOTE — ED Notes (Signed)
Admitting MD Grandville Silos at bedside at this time.

## 2018-01-25 NOTE — Progress Notes (Signed)
IR consulted for PleurX placement. Discussed with Dr. Grandville Silos who has also spoken with Dr. Earlie Server. Will work towards placement tomorrow as schedule allows. May need thoracentesis if unable to place PleurX.  Bea Graff Louk, PA-C 01/25/2018, 4:51 PM

## 2018-01-25 NOTE — Progress Notes (Signed)
MEDICATION RELATED CONSULT NOTE - INITIAL   Pharmacy Consult for etanercept Indication: psoriasis  Allergies  Allergen Reactions  . Niacin Nausea Only    REACTION: upset stomach    Patient Measurements: Height: _0  (180.3 cm) Weight: 270 lb (122.5 kg) IBW/kg (Calculated) : 75.3 Adjusted Body Weight:   Vital Signs: Temp: 98.5 F (36.9 C) (05/06 1730) Temp Source: Oral (05/06 1730) BP: 108/58 (05/07 0000) Pulse Rate: 105 (05/07 0000) Intake/Output from previous day: 05/06 0701 - 05/07 0700 In: 3000 [IV Piggyback:3000] Out: -  Intake/Output from this shift: Total I/O In: 3000 [IV Piggyback:3000] Out: -   Labs: Recent Labs    01/24/18 2002 01/24/18 2047  WBC 8.7  --   HGB 15.3  --   HCT 47.2  --   PLT 195  --   CREATININE  --  1.19  ALBUMIN  --  3.3*  PROT  --  8.3*  AST  --  25  ALT  --  28  ALKPHOS  --  67  BILITOT  --  0.6   Estimated Creatinine Clearance: 80.3 mL/min (by C-G formula based on SCr of 1.19 mg/dL).   Microbiology: Recent Results (from the past 720 hour(s))  Culture, body fluid-bottle     Status: None   Collection Time: 12/28/17  1:30 PM  Result Value Ref Range Status   Specimen Description PLEURAL RIGHT  Final   Special Requests NONE  Final   Culture   Final    NO GROWTH 5 DAYS Performed at [facility], [address], [place], [place] [address]    Report Status 01/02/2018 FINAL  Final  Gram stain     Status: None   Collection Time: 12/28/17  1:30 PM  Result Value Ref Range Status   Specimen Description PLEURAL RIGHT  Final   Special Requests NONE  Final   Gram Stain   Final    WBC PRESENT,BOTH PMN AND MONONUCLEAR NO ORGANISMS SEEN CYTOSPIN SMEAR Performed at [facility], [place] [address] [place], [place] [address]    Report Status 12/28/2017 FINAL  Final    Medical History: Past Medical History:  Diagnosis Date  . Adenomatous colon polyp   . Aortic insufficiency    Echo 3/18: Severe concentric LVH, EF  60-65, normal wall motion, moderate AI, mild LAE, mild TR // Echo 9/18: Mild concentric LVH, EF 60-65, normal wall motion, trivial AI, MAC, mild BAE  . Arthritis    left hip replacement  . Atrial flutter ([place])    onset  2011. s/p EPS/RFA 12/2011  . Cataract   . Chronic bronchitis   . Contact dermatitis and other eczema due to plants (except food)   . COPD (chronic obstructive pulmonary disease) ([place])   . GERD (gastroesophageal reflux disease)   . Hyperlipidemia   . Hypertension   . Lung cancer ([place])   . LV dysfunction    EF 45-50% 12/2011  . OSA (obstructive sleep apnea) 10/13/2016   Mild with AHI 9.7/hr with significant oxygen desaturations as low as 76% now on CPAP  . Other peripheral vascular disease(443.89)    bilateral lower extremity  . Tobacco use disorder    dependent  . Transient global amnesia   . Tuberculosis   . Type II diabetes mellitus (HCC)     Medications:   (Not in a hospital admission) Scheduled:  . arformoterol  15 mcg Nebulization BID  . budesonide (PULMICORT) nebulizer solution  0.5 mg Nebulization BID  .  diltiazem  180 mg Oral Daily  . etanercept  50 mg Subcutaneous UD  . furosemide  80 mg Oral BID  . guaiFENesin  600 mg Oral BID  . insulin aspart  0-15 Units Subcutaneous TID WC  . insulin glargine  30 Units Subcutaneous Q2200  . ipratropium-albuterol  3 mL Nebulization QID  . losartan  25 mg Oral Daily  . methylPREDNISolone (SOLU-MEDROL) injection  60 mg Intravenous Q8H  . metoprolol tartrate  50-100 mg Oral UD  . nicotine  21 mg Transdermal Daily  . potassium chloride SA  20 mEq Oral BID  . rivaroxaban  20 mg Oral Daily  . rosuvastatin  10 mg Oral Daily  . tamsulosin  0.4 mg Oral Daily    Assessment: Patient with etanercept prior to admission and reordered.  Per etanercept black box warning, "Etanercept should be discontinued if a patient develops a serious infection or sepsis." Patient as received IV antibiotics, r/o PNA  Goal of Therapy:   Safe and effective use of etanercept  Plan:  D/c order for etanercept  Nani Skillern Crowford 01/25/2018,12:27 AM

## 2018-01-25 NOTE — Progress Notes (Signed)
Per RN patient still refusing to use BiPAP.

## 2018-01-26 ENCOUNTER — Encounter (HOSPITAL_COMMUNITY): Payer: Self-pay | Admitting: Interventional Radiology

## 2018-01-26 ENCOUNTER — Inpatient Hospital Stay (HOSPITAL_COMMUNITY): Payer: Medicare Other

## 2018-01-26 ENCOUNTER — Other Ambulatory Visit: Payer: Self-pay

## 2018-01-26 DIAGNOSIS — J90 Pleural effusion, not elsewhere classified: Secondary | ICD-10-CM

## 2018-01-26 DIAGNOSIS — I110 Hypertensive heart disease with heart failure: Secondary | ICD-10-CM

## 2018-01-26 DIAGNOSIS — I361 Nonrheumatic tricuspid (valve) insufficiency: Secondary | ICD-10-CM

## 2018-01-26 DIAGNOSIS — I5032 Chronic diastolic (congestive) heart failure: Secondary | ICD-10-CM

## 2018-01-26 DIAGNOSIS — C3411 Malignant neoplasm of upper lobe, right bronchus or lung: Secondary | ICD-10-CM

## 2018-01-26 HISTORY — PX: IR GUIDED DRAIN W CATHETER PLACEMENT: IMG719

## 2018-01-26 HISTORY — PX: IR US GUIDE BX ASP/DRAIN: IMG2392

## 2018-01-26 LAB — CBC WITH DIFFERENTIAL/PLATELET
Basophils Absolute: 0 10*3/uL (ref 0.0–0.1)
Basophils Relative: 0 %
EOS ABS: 0 10*3/uL (ref 0.0–0.7)
Eosinophils Relative: 0 %
HEMATOCRIT: 43.3 % (ref 39.0–52.0)
HEMOGLOBIN: 14.1 g/dL (ref 13.0–17.0)
Lymphocytes Relative: 6 %
Lymphs Abs: 0.7 10*3/uL (ref 0.7–4.0)
MCH: 30 pg (ref 26.0–34.0)
MCHC: 32.6 g/dL (ref 30.0–36.0)
MCV: 92.1 fL (ref 78.0–100.0)
MONOS PCT: 6 %
Monocytes Absolute: 0.7 10*3/uL (ref 0.1–1.0)
NEUTROS PCT: 88 %
Neutro Abs: 10 10*3/uL — ABNORMAL HIGH (ref 1.7–7.7)
Platelets: 185 10*3/uL (ref 150–400)
RBC: 4.7 MIL/uL (ref 4.22–5.81)
RDW: 14.5 % (ref 11.5–15.5)
WBC: 11.4 10*3/uL — AB (ref 4.0–10.5)

## 2018-01-26 LAB — URINE CULTURE: Culture: NO GROWTH

## 2018-01-26 LAB — BASIC METABOLIC PANEL
Anion gap: 12 (ref 5–15)
BUN: 26 mg/dL — AB (ref 6–20)
CHLORIDE: 104 mmol/L (ref 101–111)
CO2: 25 mmol/L (ref 22–32)
CREATININE: 1.36 mg/dL — AB (ref 0.61–1.24)
Calcium: 8.7 mg/dL — ABNORMAL LOW (ref 8.9–10.3)
GFR calc Af Amer: >60 mL/min (ref >60)
GFR calc non Af Amer: 52 mL/min — ABNORMAL LOW (ref >60)
Glucose, Bld: 262 mg/dL — ABNORMAL HIGH (ref 65–99)
Potassium: 4.5 mmol/L (ref 3.5–5.1)
Sodium: 141 mmol/L (ref 135–145)

## 2018-01-26 LAB — LEGIONELLA PNEUMOPHILA SEROGP 1 UR AG: L. PNEUMOPHILA SEROGP 1 UR AG: NEGATIVE

## 2018-01-26 LAB — ECHOCARDIOGRAM COMPLETE
HEIGHTINCHES: 71 in
Weight: 4320 oz

## 2018-01-26 LAB — GLUCOSE, CAPILLARY
GLUCOSE-CAPILLARY: 217 mg/dL — AB (ref 65–99)
GLUCOSE-CAPILLARY: 331 mg/dL — AB (ref 65–99)
Glucose-Capillary: 237 mg/dL — ABNORMAL HIGH (ref 65–99)
Glucose-Capillary: 179 mg/dL — ABNORMAL HIGH (ref 65–99)

## 2018-01-26 LAB — HIV ANTIBODY (ROUTINE TESTING W REFLEX): HIV Screen 4th Generation wRfx: NONREACTIVE

## 2018-01-26 LAB — PROTIME-INR
INR: 1.08
Prothrombin Time: 14 seconds (ref 11.4–15.2)

## 2018-01-26 LAB — MAGNESIUM: Magnesium: 2 mg/dL (ref 1.7–2.4)

## 2018-01-26 MED ORDER — LIDOCAINE HCL (PF) 1 % IJ SOLN
INTRAMUSCULAR | Status: AC | PRN
  Administered 2018-01-26: 30 mL

## 2018-01-26 MED ORDER — LIDOCAINE HCL (PF) 1 % IJ SOLN
INTRAMUSCULAR | Status: AC
  Filled 2018-01-26: qty 30

## 2018-01-26 MED ORDER — AMMONIUM LACTATE 12 % EX LOTN
TOPICAL_LOTION | Freq: Every morning | CUTANEOUS | Status: DC
Start: 2018-01-26 — End: 2018-01-28
  Administered 2018-01-26 – 2018-01-27 (×2): via TOPICAL
  Filled 2018-01-26: qty 225

## 2018-01-26 MED ORDER — FENTANYL CITRATE (PF) 100 MCG/2ML IJ SOLN
INTRAMUSCULAR | Status: AC
  Filled 2018-01-26: qty 2

## 2018-01-26 MED ORDER — MIDAZOLAM HCL 2 MG/2ML IJ SOLN
INTRAMUSCULAR | Status: AC
  Filled 2018-01-26: qty 2

## 2018-01-26 MED ORDER — FENTANYL CITRATE (PF) 100 MCG/2ML IJ SOLN
INTRAMUSCULAR | Status: AC | PRN
  Administered 2018-01-26: 50 ug via INTRAVENOUS

## 2018-01-26 MED ORDER — RIVAROXABAN 20 MG PO TABS
20.0000 mg | ORAL_TABLET | Freq: Every day | ORAL | Status: DC
  Administered 2018-01-27: 20 mg via ORAL
  Filled 2018-01-26: qty 1

## 2018-01-26 MED ORDER — SODIUM CHLORIDE 0.9 % IV SOLN
1500.0000 mg | INTRAVENOUS | Status: DC
  Administered 2018-01-27: 1500 mg via INTRAVENOUS
  Filled 2018-01-26: qty 1500

## 2018-01-26 MED ORDER — MIDAZOLAM HCL 2 MG/2ML IJ SOLN
INTRAMUSCULAR | Status: AC | PRN
  Administered 2018-01-26: 1 mg via INTRAVENOUS

## 2018-01-26 NOTE — Sedation Documentation (Signed)
1.8 liters drained via plurex.

## 2018-01-26 NOTE — Progress Notes (Signed)
DIAGNOSIS: Recurrent and metastatic non-small cell lung cancer, adenocarcinoma initially diagnosed as stage IB (T2a, N0, M0) non-small cell lung cancer, squamous cell carcinoma presented with right upper lobe pleural-based mass diagnosed in June 2018.  He has disease recurrence and April 2019 consistent with adenocarcinoma in the right pleural fluid.  PRIOR THERAPY: definitive and curative SBRT to the right upper lobe lung nodule under the care of Dr. Tammi Klippel completed on 04/05/2017.  CURRENT THERAPY: Observation.  Subjective: The patient is seen and examined today.  He is a very pleasant 67 years old white male with recurrent and metastatic non-small cell lung cancer that was initially diagnosed as a stage Ib status post definitive radiotherapy to right upper lobe lung nodule in July 2018.  Unfortunately he was found to have malignant right pleural effusion.  He was admitted to the hospital recently with recurrent right pleural effusion.  He is feeling a little bit better except for the shortness of breath.  He denied having any current fever or chills.  He has no nausea or vomiting.  He underwent right Pleurx catheter placement earlier today.  Objective: Vital signs in last 24 hours: Temp:  [97.7 F (36.5 C)-98.1 F (36.7 C)] 97.7 F (36.5 C) (05/08 1250) Pulse Rate:  [72-133] 115 (05/08 1635) Resp:  [17-28] 23 (05/08 1635) BP: (79-123)/(40-96) 100/53 (05/08 1635) SpO2:  [87 %-97 %] 95 % (05/08 1635)  Intake/Output from previous day: 05/07 0701 - 05/08 0700 In: 1490 [P.O.:540; IV Piggyback:950] Out: 1031 [Urine:1030; Stool:1] Intake/Output this shift: Total I/O In: 100 [IV Piggyback:100] Out: 1600 [Urine:1600]  General appearance: alert, cooperative, fatigued and no distress Resp: clear to auscultation bilaterally Cardio: regular rate and rhythm, S1, S2 normal, no murmur, click, rub or gallop GI: soft, non-tender; bowel sounds normal; no masses,  no organomegaly Extremities:  extremities normal, atraumatic, no cyanosis or edema  Lab Results:  Recent Labs    01/25/18 0529 01/26/18 0259  WBC 5.2 11.4*  HGB 12.4* 14.1  HCT 38.9* 43.3  PLT 139* 185   BMET Recent Labs    01/25/18 0529 01/25/18 1022 01/26/18 0259  NA 138  --  141  K 5.4* 4.7 4.5  CL 107  --  104  CO2 22  --  25  GLUCOSE 205*  --  262*  BUN 15  --  26*  CREATININE 0.98  --  1.36*  CALCIUM 7.9*  --  8.7*    Studies/Results: Dg Chest 2 View  Result Date: 01/24/2018 CLINICAL DATA:  Shortness of breath.  Lung cancer.  COPD. EXAM: CHEST - 2 VIEW COMPARISON:  01/05/2018. FINDINGS: Marked worsening of BILATERAL pulmonary opacities, particularly on the RIGHT, with consolidation, atelectasis, and effusion. Asymmetric interstitial like opacity at the LEFT base is increased, without effusion or significant volume loss. Cardiomegaly. Thoracic atherosclerosis. IMPRESSION: Marked worsening aeration, with significant increase in RIGHT lung opacity, likely combination of atelectasis, infiltrate, and effusion, likely worsening metastatic disease. Electronically Signed   By: Staci Righter M.D.   On: 01/24/2018 18:44    Medications: I have reviewed the patient's current medications.  Assessment/Plan: This is a very pleasant 67 years old white male with recurrent non-small cell lung cancer, adenocarcinoma presented with recurrent malignant right pleural effusion.  A recent PET scan showed no other evidence of metastatic disease outside the chest. The patient had molecular studies performed by [facility] and unfortunately did not show any actionable mutations.  PDL 1 expression was also 0%. I discussed with the patient his  treatment options and he understands that the only available option will be palliative care versus palliative systemic chemotherapy plus/minus immunotherapy. He is a still interested in treatment and we will discuss this on outpatient basis after discharge. For the recurrent right pleural  effusion, the patient underwent right Pleurx catheter placement for drainage of the fluid.  He will need arrangement for advanced home care to help him with the drainage after discharge. The patient has a scheduled appointment with me on Jan 28, 2018 for further evaluation of his condition and more detailed discussion of his systemic treatment options. Thank you for taking good care of Mr. Beneke, I will continue to follow-up the patient with you and assist in his management on as-needed basis.   LOS: 1 day    Eilleen Kempf 01/26/2018

## 2018-01-26 NOTE — Procedures (Signed)
Interventional Radiology Procedure Note  Procedure: Right tunneled pleural drainage catheter with aspiration of 1700 mL pleural fluid.   Complications: None  Estimated Blood Loss: NOne  Recommendations: - Care management consult  Signed,  Criselda Peaches, MD

## 2018-01-26 NOTE — Progress Notes (Signed)
Inpatient Diabetes Program Recommendations  AACE/ADA: New Consensus Statement on Inpatient Glycemic Control (2015)  Target Ranges:  Prepandial:   less than 140 mg/dL      Peak postprandial:   less than 180 mg/dL (1-2 hours)      Critically ill patients:  140 - 180 mg/dL   Lab Results  Component Value Date   GLUCAP 240 (H) 01/25/2018   HGBA1C 7.3 (H) 12/08/2017    Review of Glycemic Control  Blood sugars above goal. Pt is NPO.  Inpatient Diabetes Program Recommendations:     Increase Novolog to 0-20 units Q4H while NPO.  Continue to follow glucose trends.  Thank you. Lorenda Peck, RD, LDN, CDE Inpatient Diabetes Coordinator [phone]

## 2018-01-26 NOTE — Progress Notes (Signed)
Pharmacy Antibiotic Note  Elazar Argabright. is a 67 y.o. male admitted on 01/24/2018 with pneumonia.  Pharmacy has been consulted for vanc dosing.  Today, 01/26/2018 Day #2 Vancomycin/Cefepime SCr 0.98 > 1.36 WBC 11.4 (steroids)  Plan: 1) Decrease Vancomycin to 1500mg  IV q24h for estimated AUC 480 2) Check vancomycin levels at steady state - goal AUC 400-500 2) Continue current cefepime dose  Height: 5\' 11"  (180.3 cm) Weight: 270 lb (122.5 kg) IBW/kg (Calculated) : 75.3  Temp (24hrs), Avg:98 F (36.7 C), Min:97.9 F (36.6 C), Max:98.1 F (36.7 C)  Recent Labs  Lab 01/24/18 1938 01/24/18 2002 01/24/18 2047 01/24/18 2113 01/25/18 0134 01/25/18 0529 01/26/18 0259  WBC  --  8.7  --   --   --  5.2 11.4*  CREATININE  --   --  1.19  --   --  0.98 1.36*  LATICACIDVEN 2.06*  --   --  3.47* 2.3* 1.6  --     Estimated Creatinine Clearance: 70.2 mL/min (A) (by C-G formula based on SCr of 1.36 mg/dL (H)).    Allergies  Allergen Reactions  . Niacin Nausea Only    REACTION: upset stomach   Antimicrobials this admission: 5/7 Zith/Rocephin x 1 5/7 cefepime >> 5/7 vanc >>  Dose adjustments this admission:   Microbiology results: 5/6 BCx: sent 5/7 MRSA PCR: neg  Thank you for allowing pharmacy to be a part of this patient's care.  Peggyann Juba, PharmD, BCPS Pager: [phone] 01/26/2018 7:41 AM

## 2018-01-26 NOTE — Progress Notes (Signed)
Informed Dr. Aileen Fass via text page that pt's daughter Zebedee Iba [phone] would like to speak with him when he gets a moment.  Iantha Fallen RN 8:56 AM 01/26/2018

## 2018-01-26 NOTE — Progress Notes (Signed)
[facility] PROGRESS NOTE    Progress Note  Neil Carroll  UUV:[id number] DOB: 1950-11-08 DOA: 01/24/2018 PCP: Hoyt Koch, MD     Brief Narrative:   Neil Housekeeper Sr. is an 67 y.o. male past medical history of A. fib on Xarelto, chronic diastolic heart failure, squamous cell carcinoma of the right lung, COPD oxygen dependent on 4 L who presents complaining of progressive worsening shortness of breath 1 week prior to admission, she was recently admitted and discharged on 12/29/2017 for shortness of breath and acute large pleural effusion, for which she underwent thoracocentesis during that admission, in the ED was found to be hypoxic chest x-ray show worsening a radiation of the right lung field with a possible infiltrate she was started empirically on IV vancomycin and cefepime  Assessment/Plan:   Acute on chronic respiratory failure with hypoxia (HCC) multifactorial secondary to recurrent malignant right pleural effusion, possibly acute diastolic heart failure and probable postobstructive pneumonia versus healthcare associated pneumonia and COPD exacerbation. Patient is currently on nonrebreather speaking full sentences.  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 he has refused BiPAP. 2D echo pending Blood cultures negative till date Continue empirically on IV vancomycin and cefepime. Started on IV steroids Started on IV DuoNeb's.  Recurrent malignant right pleural effusion: We have consulted IR for possible Pleurx catheter placement.  Acute on chronic diastolic heart failure: On physical exam patient seems to be markedly fluid overloaded. Initially on admission he was fluid resuscitated, now about 3-1/2 L positive. Continue beta-blockers, statin and Xarelto.  Chronic atrial fibrillation: Rate control asymptomatic CHA2DS2VASC score 5 Continue metoprolol for rate control and  Cardizem.Alveda Reasons for anticoagulation.  Hyperlipidemia: Continue statins.  Type 2 diabetes mellitus with vascular disease (HCC) A1c was 7.3 continue sliding scale insulin plus Lantus.  Hypertensive heart disease with CHF (congestive heart failure) (HCC) Continue current medication metoprolol and Lasix. Continue to hold losartan secondary to hyperkalemia.  COPD exacerbation (HCC) Continue oxygen.  Inhalers, IV antibiotics and steroids.  Hyperkalemia: Losartan was discontinued 24 hours prior to admission his potassium is improved.    Chronic anticoagulation-Xarelto (CHADs VASc=5)  OSA/Obesity (BMI 30-39.9)   DVT prophylaxis: Xarelto Family Communication:none Disposition Plan/Barrier to D/C: unable to determine Code Status:     Code Status Orders  (From admission, onward)        Start     Ordered   01/25/18 0008  Full code  Continuous     01/25/18 0009    Code Status History    Date Active Date Inactive Code Status Order ID Comments User Context   12/24/2017 1805 12/29/2017 1640 Full Code [id number]  Doreatha Lew, MD ED   02/25/2017 1157 02/27/2017 1951 Full Code [id number]  Saverio Danker, PA-C Inpatient   02/25/2016 1915 02/29/2016 1608 Full Code [id number]  Bonnell Public, MD Inpatient   02/25/2016 1915 02/25/2016 1915 Full Code [id number]  Bonnell Public, MD Inpatient   11/10/2015 1638 11/12/2015 1510 Full Code [id number]  Samella Parr, NP Inpatient   07/10/2015 1259 07/10/2015 1904 Full Code [id number]  Sherren Mocha, MD Inpatient        IV Access:    Peripheral IV   Procedures and diagnostic studies:   Dg Chest 2 View  Result Date: 01/24/2018 CLINICAL DATA:  Shortness of breath.  Lung cancer.  COPD. EXAM: CHEST - 2 VIEW COMPARISON:  01/05/2018. FINDINGS: Marked worsening of BILATERAL pulmonary opacities, particularly on the RIGHT,  with consolidation, atelectasis, and effusion. Asymmetric interstitial like opacity at the LEFT base is increased, without  effusion or significant volume loss. Cardiomegaly. Thoracic atherosclerosis. IMPRESSION: Marked worsening aeration, with significant increase in RIGHT lung opacity, likely combination of atelectasis, infiltrate, and effusion, likely worsening metastatic disease. Electronically Signed   By: Staci Righter M.D.   On: 01/24/2018 18:44     Medical Consultants:    None.  Anti-Infectives:   IV vancomycin and cefepime.  Subjective:    [place] he relates his breathing is slightly better compared to yesterday. Objective:    Vitals:   01/26/18 0300 01/26/18 0301 01/26/18 0315 01/26/18 0400  BP:   90/67   Pulse: 94  98 96  Resp: (!) 27  (!) 27   Temp:  97.9 F (36.6 C)    TempSrc:  Oral    SpO2: 91%  95% (!) 87%  Weight:      Height:        Intake/Output Summary (Last 24 hours) at 01/26/2018 0739 Last data filed at 01/25/2018 2200 Gross per 24 hour  Intake 1490 ml  Output 1031 ml  Net 459 ml   Filed Weights   01/24/18 1732 01/24/18 2242  Weight: 122.5 kg (270 lb) 122.5 kg (270 lb)    Exam: General exam: In no acute distress. Respiratory system: Good air movement with wheezing on the left decreased sounds on the right lower lung Cardiovascular system: S1 & S2 heard, RRR.  Positive hepatojugular reflux 3+ edema Gastrointestinal system: Abdomen is nondistended, soft and nontender.  Central nervous system: Alert and oriented. No focal neurological deficits. Extremities: 3+ edema with chronic venous stasis changes Skin: No rashes, lesions or ulcers Psychiatry: Judgement and insight appear normal. Mood & affect appropriate.    Data Reviewed:    Labs: Basic Metabolic Panel: Recent Labs  Lab 01/24/18 2047 01/25/18 0529 01/25/18 1022 01/26/18 0259  NA 141 138  --  141  K 4.0 5.4* 4.7 4.5  CL 100* 107  --  104  CO2 25 22  --  25  GLUCOSE 133* 205*  --  262*  BUN 14 15  --  26*  CREATININE 1.19 0.98  --  1.36*  CALCIUM 9.3 7.9*  --  8.7*  MG  --   --    --  2.0   GFR Estimated Creatinine Clearance: 70.2 mL/min (A) (by C-G formula based on SCr of 1.36 mg/dL (H)). Liver Function Tests: Recent Labs  Lab 01/24/18 2047  AST 25  ALT 28  ALKPHOS 67  BILITOT 0.6  PROT 8.3*  ALBUMIN 3.3*   No results for input(s): LIPASE, AMYLASE in the last 168 hours. No results for input(s): AMMONIA in the last 168 hours. Coagulation profile No results for input(s): INR, PROTIME in the last 168 hours.  CBC: Recent Labs  Lab 01/24/18 2002 01/25/18 0529 01/26/18 0259  WBC 8.7 5.2 11.4*  NEUTROABS  --   --  10.0*  HGB 15.3 12.4* 14.1  HCT 47.2 38.9* 43.3  MCV 92.0 92.6 92.1  PLT 195 139* 185   Cardiac Enzymes: Recent Labs  Lab 01/24/18 2047 01/25/18 1022 01/25/18 1537 01/25/18 2149  TROPONINI <0.03 <0.03 <0.03 <0.03   BNP (last 3 results) Recent Labs    10/13/17 1202 10/21/17 1203  PROBNP 992* 962*   CBG: Recent Labs  Lab 01/24/18 2354 01/25/18 0747 01/25/18 1225 01/25/18 1557 01/25/18 2103  GLUCAP 168* 194* 228* 170* 240*   D-Dimer: No results  for input(s): DDIMER in the last 72 hours. Hgb A1c: No results for input(s): HGBA1C in the last 72 hours. Lipid Profile: No results for input(s): CHOL, HDL, LDLCALC, TRIG, CHOLHDL, LDLDIRECT in the last 72 hours. Thyroid function studies: Recent Labs    01/25/18 1022  TSH 0.292*   Anemia work up: No results for input(s): VITAMINB12, FOLATE, FERRITIN, TIBC, IRON, RETICCTPCT in the last 72 hours. Sepsis Labs: Recent Labs  Lab 01/24/18 1938 01/24/18 2002 01/24/18 2113 01/25/18 0134 01/25/18 0529 01/26/18 0259  PROCALCITON  --   --   --   --  <0.10  --   WBC  --  8.7  --   --  5.2 11.4*  LATICACIDVEN 2.06*  --  3.47* 2.3* 1.6  --    Microbiology Recent Results (from the past 240 hour(s))  Blood culture (routine x 2)     Status: None (Preliminary result)   Collection Time: 01/24/18  8:02 PM  Result Value Ref Range Status   Specimen Description   Final    BLOOD RIGHT  HAND Performed at [facility], [place] [address], [place], [place] [address]    Special Requests   Final    AEROBIC BOTTLE ONLY Blood Culture adequate volume Performed at [facility] [address] [place], [place] [address]    Culture   Final    NO GROWTH < 24 HOURS Performed at [place] [address], [place], [place] [address]    Report Status PENDING  Incomplete  MRSA PCR Screening     Status: None   Collection Time: 01/25/18  8:50 AM  Result Value Ref Range Status   MRSA by PCR NEGATIVE NEGATIVE Final    Comment:        The GeneXpert MRSA Assay (FDA approved for NASAL specimens only), is one component of a comprehensive MRSA colonization surveillance program. It is not intended to diagnose MRSA infection nor to guide or monitor treatment for MRSA infections. Performed at [facility], [place] [address], [place], [place] [address]      Medications:   . arformoterol  15 mcg Nebulization BID  . budesonide (PULMICORT) nebulizer solution  0.5 mg Nebulization BID  . diltiazem  180 mg Oral Daily  . furosemide  80 mg Intravenous Q12H  . guaiFENesin  1,200 mg Oral BID  . insulin aspart  0-15 Units Subcutaneous TID WC  . insulin glargine  30 Units Subcutaneous Q2200  . ipratropium  0.5 mg Nebulization Q6H  . levalbuterol  0.63 mg Nebulization Q6H  . loratadine  10 mg Oral Daily  . methylPREDNISolone (SOLU-MEDROL) injection  60 mg Intravenous Q8H  . metoprolol tartrate  100 mg Oral Daily  . metoprolol tartrate  150 mg Oral QHS  . nicotine  21 mg Transdermal Daily  . pantoprazole  40 mg Oral Q0600  . rosuvastatin  10 mg Oral Daily  . tamsulosin  0.4 mg Oral Daily   Continuous Infusions: . ceFEPime (MAXIPIME) IV Stopped (01/26/18 0453)  . vancomycin Stopped (01/25/18 2300)       LOS: 1 day   Neil Carroll  [facility] Pager [phone]  *Please refer to [email or web address], password [email or web address] to get  updated schedule on who will round on this patient, as hospitalists switch teams weekly. If 7PM-7AM, please contact night-coverage at [email or web address], password [email or web address] for any overnight needs.  01/26/2018, 7:39 AM

## 2018-01-26 NOTE — Progress Notes (Signed)
Informed Dr. Aileen Fass via text page the patient is in Afib.  Iantha Fallen RN 10:47 AM 01/26/2018

## 2018-01-26 NOTE — Progress Notes (Signed)
  Echocardiogram 2D Echocardiogram has been performed.  Neil Carroll F 01/26/2018, 3:36 PM

## 2018-01-26 NOTE — Consult Note (Signed)
[facility] Nurse wound consult note Reason for Consult: Left lower leg wound, just below the knee Wound type: Chronic Venous Insufficiency Measurement:1.3 cm x 1.1 cm x 0.2 cm Wound bed: 100% clean, pink Drainage (amount, consistency, odor) None observed.  Area open to air Periwound: Bilateral lower legs with changes associated with chronic venous insufficiency including:  Edema, orange peel skin, hemosiderin staining, and old healed wound areas.  Pt also states he has been treated with Unna boots once or twice a week, off and on, for years.  He has not recently been receiving Unna boots. He does not currently receive home health care for the purposes of Unna boots but has in the past, and is agreeable to working towards that now. Dressing procedure/placement/frequency: The patient is currently receiving oxygen via a high flow face mask, and has +4 pitting edema to LEs bilaterally.  For today, I will enter orders for skin care and Ace wraps to the legs, and I plan to re-evaluate the patient for Unna boot placement tomorrow.  I am concerned that placing Unna boots today may result in additional fluid overload and further compromise the patient's respiratory ability. Val Riles, RN, MSN, CWOCN, CNS-BC, pager [phone]

## 2018-01-26 NOTE — Progress Notes (Signed)
Referring Physician(s): [name],[name],M  Supervising Physician: Jacqulynn Cadet  Patient Status:  [facility] - In-pt  Chief Complaint:  Dyspnea, lung cancer  Subjective: Patient familiar to IR service from prior right lung lesion biopsy with subsequent pneumothorax and chest tube placement on 02/25/2017.  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  He underwent right thoracentesis on 12/28/2017 which revealed metastatic adenocarcinoma.  He presents now with persistent/worsening dyspnea and chest x-ray showing worsening aeration with significant increase in right lung opacity likely combination of atelectasis and infiltrate and effusion with likely worsening metastatic disease.  Request now received for right Pleurx catheter placement.  Patient currently denies fever, headache, chest pain, abdominal/back pain, nausea, vomiting.  He does have significant lower extremity swelling bilaterally. Past Medical History:  Diagnosis Date  . Adenomatous colon polyp   . Aortic insufficiency    Echo 3/18: Severe concentric LVH, EF 60-65, normal wall motion, moderate AI, mild LAE, mild TR // Echo 9/18: Mild concentric LVH, EF 60-65, normal wall motion, trivial AI, MAC, mild BAE  . Arthritis    left hip replacement  . Atrial flutter ([place])    onset  2011. s/p EPS/RFA 12/2011  . Cataract   . Chronic bronchitis   . Contact dermatitis and other eczema due to plants (except food)   . COPD (chronic obstructive pulmonary disease) ([place])   . GERD (gastroesophageal reflux disease)   . Hyperlipidemia   . Hypertension   . Lung cancer ([place])   . LV dysfunction    EF 45-50% 12/2011  . OSA (obstructive sleep apnea) 10/13/2016   Mild with AHI 9.7/hr with significant oxygen desaturations as low as 76% now on CPAP  . Other peripheral vascular disease(443.89)    bilateral lower extremity  . Tobacco use disorder    dependent  . Transient global amnesia   . Tuberculosis   . Type II diabetes mellitus  ([place])    Past Surgical History:  Procedure Laterality Date  . A FLUTTER ABLATION N/A 12/28/2011   Procedure: ABLATION A FLUTTER;  Surgeon: Evans Lance, MD;  Location: [facility] CATH LAB;  Service: Cardiovascular;  Laterality: N/A;  . CARDIAC CATHETERIZATION N/A 07/10/2015   Procedure: Left Heart Cath and Coronary Angiography;  Surgeon: Sherren Mocha, MD;  Location: [facility] CV LAB;  Service: Cardiovascular;  Laterality: N/A;  . CARDIAC ELECTROPHYSIOLOGY MAPPING AND ABLATION  03/2010  . CARDIOVERSION N/A 07/13/2016   Procedure: CARDIOVERSION;  Surgeon: Skeet Latch, MD;  Location: [place];  Service: Cardiovascular;  Laterality: N/A;  . COLONOSCOPY    . colonoscopy with polypectomy  2006 & 2011   Dr [name]  . CYSTOSCOPY  11/2007   Dr Jeffie Pollock  . PARTIAL HIP ARTHROPLASTY Right 01/2000   "Right; replaced ball & stem"  . PARTIAL HIP ARTHROPLASTY Left   . POLYPECTOMY    . TEE WITHOUT CARDIOVERSION  12/28/2011   Procedure: TRANSESOPHAGEAL ECHOCARDIOGRAM (TEE);  Surgeon: Larey Dresser, MD;  Location: [facility] ENDOSCOPY;  Service: Cardiovascular;  Laterality: N/A;     Allergies: Niacin  Medications: Prior to Admission medications   Medication Sig Start Date End Date Taking? Authorizing Provider  acetaminophen (TYLENOL) 650 MG CR tablet Take 1,300 mg by mouth every 8 (eight) hours as needed for pain.    Yes [provider]  CRESTOR 10 MG tablet Take 1 tablet (10 mg total) by mouth daily. 01/11/18  Yes Hoyt Koch, MD  diltiazem (CARTIA XT) 180 MG 24 hr capsule Take 1 capsule (180 mg  total) by mouth 2 (two) times daily. 01/08/17  Yes Sherren Mocha, MD  etanercept (ENBREL SURECLICK) 50 MG/ML injection Inject 50 mg into the skin as directed. Inject on Sundays & Wednesdays    Yes [provider]  Flaxseed, Linseed, (FLAX SEED OIL) 1000 MG CAPS Take 1 capsule by mouth 2 (two) times daily.    Yes [provider]  Fluticasone-Umeclidin-Vilant (TRELEGY ELLIPTA)  100-62.5-25 MCG/INH AEPB Inhale 1 puff into the lungs daily. 10/14/17  Yes Hoyt Koch, MD  furosemide (LASIX) 80 MG tablet Take 1 tablet (80 mg total) by mouth 2 (two) times daily. 12/30/17  Yes Sherren Mocha, MD  Insulin Glargine (LANTUS SOLOSTAR) 100 UNIT/ML Solostar Pen Inject 30 Units into the skin daily at 10 pm. 12/08/17  Yes Elayne Snare, MD  ipratropium-albuterol (DUONEB) 0.5-2.5 (3) MG/3ML SOLN Take 3 mLs by nebulization every 4 (four) hours as needed. 12/29/17  Yes Hosie Poisson, MD  liraglutide (VICTOZA) 18 MG/3ML SOPN INJECT 0.3ML (=1.8MG)      SUBCUTANEOUSLY DAILY 07/26/17  Yes Elayne Snare, MD  losartan (COZAAR) 25 MG tablet TAKE 1 TABLET BY MOUTH ONCE DAILY 12/23/17  Yes Sherren Mocha, MD  metFORMIN (GLUCOPHAGE) 1000 MG tablet TAKE 1 TABLET BY MOUTH TWICE DAILY WITH FOOD 12/20/17  Yes Elayne Snare, MD  metoprolol tartrate (LOPRESSOR) 100 MG tablet take1 tablet by mouth every morning and take 1 and 1/2 tablets by mouth every evening 08/03/17  Yes Sherren Mocha, MD  Multiple Vitamins-Minerals (CENTRUM ADULTS) TABS Take 1 tablet by mouth daily.   Yes [provider]  mupirocin ointment (BACTROBAN) 2 % Apply 1 application topically 2 (two) times daily as needed (skin).    Yes [provider]  nicotine (NICODERM CQ - DOSED IN MG/24 HOURS) 21 mg/24hr patch Place 1 patch (21 mg total) onto the skin daily. 12/30/17  Yes Hosie Poisson, MD  Nintedanib (OFEV) 150 MG CAPS Take 1 capsule (150 mg total) by mouth 2 (two) times daily. 10/29/17  Yes Rigoberto Noel, MD  Omega-3 Fatty Acids (FISH OIL) 1000 MG CAPS Take 1 capsule by mouth 2 (two) times daily.    Yes [provider]  potassium chloride SA (K-DUR,KLOR-CON) 20 MEQ tablet take 1 tablet by mouth twice a day 04/22/17  Yes Imogene Burn, PA-C  tamsulosin (FLOMAX) 0.4 MG CAPS capsule Take 0.4 mg by mouth daily.    Yes [provider]  triamcinolone cream (KENALOG) 0.1 % Apply 1 application topically 2 (two)  times daily as needed (affected area/skin).    Yes [provider]  XARELTO 20 MG TABS tablet TAKE 1 TABLET BY MOUTH ONCE DAILY 01/17/18  Yes Sherren Mocha, MD  guaiFENesin (MUCINEX) 600 MG 12 hr tablet Take 1 tablet (600 mg total) by mouth 2 (two) times daily. Patient not taking: Reported on 01/11/2018 12/29/17   Hosie Poisson, MD  NOVOFINE 32G X 6 MM MISC use twice a day 03/22/17   Elayne Snare, MD  [facility] DELICA LANCETS FINE MISC TEST BLOOD SUGAR DAILY 03/22/17   Elayne Snare, MD  predniSONE (DELTASONE) 20 MG tablet Prednisone 60 mg daily for 2 days followed by  Prednisone 40 mg daily for 3 days followed by  Prednisone 20 mg daily for 3 days . Patient not taking: Reported on 01/24/2018 12/29/17   Hosie Poisson, MD  senna-docusate (SENOKOT-S) 8.6-50 MG tablet Take 1 tablet by mouth at bedtime as needed for mild constipation. Patient not taking: Reported on 01/11/2018 12/29/17   Hosie Poisson, MD  Vital Signs: BP 90/67   Pulse 96   Temp 97.8 F (36.6 C) (Oral)   Resp (!) 27   Ht 5' 11"  (1.803 m)   Wt 270 lb (122.5 kg)   SpO2 97%   BMI 37.66 kg/m   Physical Exam awake, alert; chest with slightly diminished breath sounds right base, left okay.  Heart with irregular rhythm, normal rate; abdomen soft, positive bowel sounds, nontender.  Significant lower extremity edema bilaterally.  Imaging: Dg Chest 2 View  Result Date: 01/24/2018 CLINICAL DATA:  Shortness of breath.  Lung cancer.  COPD. EXAM: CHEST - 2 VIEW COMPARISON:  01/05/2018. FINDINGS: Marked worsening of BILATERAL pulmonary opacities, particularly on the RIGHT, with consolidation, atelectasis, and effusion. Asymmetric interstitial like opacity at the LEFT base is increased, without effusion or significant volume loss. Cardiomegaly. Thoracic atherosclerosis. IMPRESSION: Marked worsening aeration, with significant increase in RIGHT lung opacity, likely combination of atelectasis, infiltrate, and effusion, likely worsening  metastatic disease. Electronically Signed   By: Staci Righter M.D.   On: 01/24/2018 18:44    Labs:  CBC: Recent Labs    01/10/18 1033 01/24/18 2002 01/25/18 0529 01/26/18 0259  WBC 11.3* 8.7 5.2 11.4*  HGB 16.2 15.3 12.4* 14.1  HCT 50.2* 47.2 38.9* 43.3  PLT 143 195 139* 185    COAGS: Recent Labs    02/25/17 0605  INR 1.01  APTT 33    BMP: Recent Labs    01/10/18 1033 01/24/18 2047 01/25/18 0529 01/25/18 1022 01/26/18 0259  NA 138 141 138  --  141  K 3.2* 4.0 5.4* 4.7 4.5  CL 95* 100* 107  --  104  CO2 29 25 22   --  25  GLUCOSE 179* 133* 205*  --  262*  BUN 18 14 15   --  26*  CALCIUM 9.3 9.3 7.9*  --  8.7*  CREATININE 1.16 1.19 0.98  --  1.36*  GFRNONAA >60 >60 >60  --  52*  GFRAA >60 >60 >60  --  >60    LIVER FUNCTION TESTS: Recent Labs    12/24/17 1546 12/25/17 0342 01/10/18 1033 01/24/18 2047  BILITOT 0.7 0.5 0.6 0.6  AST 21 21 13 25   ALT 22 25 27 28   ALKPHOS 57 50 51 67  PROT 7.8 7.3 6.8 8.3*  ALBUMIN 3.6 3.3* 3.3* 3.3*    Assessment and Plan: Patient with history of squamous cell carcinoma right lung diagnosed in 2018; s/p post right thoracentesis on 12/28/2017 yielding metastatic adenocarcinoma.  Patient now with persistent/worsening dyspnea, CXR with combination of atelectasis and effusion /worsening metastatic disease.  Request received for right Pleurx catheter placement.  Imaging studies were reviewed by Dr. Laurence Ferrari.  Current labs include WBC 11.4, hemoglobin 14.1, platelets 185k, creatinine 1.36, PT/INR pending.  Blood/urine cultures negative to date.  Details/risks of procedure, including but not limited to, internal bleeding, infection, injury to adjacent structures//pneumothorax discussed with patient with his understanding and consent.  Procedure tentatively scheduled for later today.   Electronically Signed: D. Rowe Robert, PA-C 01/26/2018, 9:26 AM   I spent a total of 25 minutes at the the patient's bedside AND on the patient's  hospital floor or unit, greater than 50% of which was counseling/coordinating care for right Pleurx catheter placement    Patient ID: Neil Carroll., male   DOB: 1951/02/22, 67 y.o.   MRN: [id number]

## 2018-01-27 ENCOUNTER — Telehealth: Payer: Self-pay | Admitting: Medical Oncology

## 2018-01-27 LAB — GLUCOSE, CAPILLARY
GLUCOSE-CAPILLARY: 398 mg/dL — AB (ref 65–99)
GLUCOSE-CAPILLARY: 310 mg/dL — AB (ref 65–99)
Glucose-Capillary: 399 mg/dL — ABNORMAL HIGH (ref 65–99)
Glucose-Capillary: 359 mg/dL — ABNORMAL HIGH (ref 65–99)

## 2018-01-27 LAB — BASIC METABOLIC PANEL
Anion gap: 11 (ref 5–15)
BUN: 30 mg/dL — ABNORMAL HIGH (ref 6–20)
CALCIUM: 8.8 mg/dL — AB (ref 8.9–10.3)
CHLORIDE: 102 mmol/L (ref 101–111)
CO2: 29 mmol/L (ref 22–32)
CREATININE: 1.21 mg/dL (ref 0.61–1.24)
Glucose, Bld: 271 mg/dL — ABNORMAL HIGH (ref 65–99)
Potassium: 4.5 mmol/L (ref 3.5–5.1)
SODIUM: 142 mmol/L (ref 135–145)

## 2018-01-27 LAB — HEMOGLOBIN A1C
HEMOGLOBIN A1C: 9.2 % — AB (ref 4.8–5.6)
MEAN PLASMA GLUCOSE: 217.34 mg/dL

## 2018-01-27 MED ORDER — INSULIN ASPART 100 UNIT/ML ~~LOC~~ SOLN
0.0000 [IU] | Freq: Three times a day (TID) | SUBCUTANEOUS | Status: DC
  Administered 2018-01-27: 15 [IU] via SUBCUTANEOUS
  Administered 2018-01-27 – 2018-01-28 (×2): 11 [IU] via SUBCUTANEOUS

## 2018-01-27 MED ORDER — FUROSEMIDE 10 MG/ML IJ SOLN
80.0000 mg | Freq: Two times a day (BID) | INTRAMUSCULAR | Status: AC
  Administered 2018-01-27 (×2): 80 mg via INTRAVENOUS
  Filled 2018-01-27 (×2): qty 8

## 2018-01-27 MED ORDER — INSULIN GLARGINE 100 UNIT/ML ~~LOC~~ SOLN
17.0000 [IU] | Freq: Two times a day (BID) | SUBCUTANEOUS | Status: DC
  Administered 2018-01-27: 17 [IU] via SUBCUTANEOUS
  Filled 2018-01-27: qty 0.17

## 2018-01-27 MED ORDER — INSULIN ASPART 100 UNIT/ML ~~LOC~~ SOLN
0.0000 [IU] | Freq: Three times a day (TID) | SUBCUTANEOUS | Status: DC
Start: 2018-01-27 — End: 2018-01-27
  Administered 2018-01-27: 9 [IU] via SUBCUTANEOUS

## 2018-01-27 MED ORDER — INSULIN ASPART 100 UNIT/ML ~~LOC~~ SOLN
0.0000 [IU] | Freq: Every day | SUBCUTANEOUS | Status: DC
  Administered 2018-01-27: 5 [IU] via SUBCUTANEOUS

## 2018-01-27 MED ORDER — INSULIN ASPART 100 UNIT/ML ~~LOC~~ SOLN
3.0000 [IU] | Freq: Three times a day (TID) | SUBCUTANEOUS | Status: DC
  Administered 2018-01-27: 3 [IU] via SUBCUTANEOUS

## 2018-01-27 MED ORDER — INSULIN GLARGINE 100 UNIT/ML ~~LOC~~ SOLN
20.0000 [IU] | Freq: Two times a day (BID) | SUBCUTANEOUS | Status: DC
  Administered 2018-01-27 – 2018-01-28 (×2): 20 [IU] via SUBCUTANEOUS
  Filled 2018-01-27 (×3): qty 0.2

## 2018-01-27 MED ORDER — INSULIN ASPART 100 UNIT/ML ~~LOC~~ SOLN: 0.0000 [IU] | Freq: Every day | SUBCUTANEOUS | Status: DC

## 2018-01-27 MED ORDER — INSULIN ASPART 100 UNIT/ML ~~LOC~~ SOLN
4.0000 [IU] | Freq: Three times a day (TID) | SUBCUTANEOUS | Status: DC
  Administered 2018-01-27 – 2018-01-28 (×3): 4 [IU] via SUBCUTANEOUS

## 2018-01-27 NOTE — Telephone Encounter (Signed)
Order forms for draining pleurix need to be signed. Forms put on Neil Carroll's desk. Please fax back to Alinda Sierras at [phone].

## 2018-01-27 NOTE — Progress Notes (Signed)
Referring Physician(s): Neil Carroll  Supervising Physician: Neil Carroll  Patient Status:  Neil Carroll - In-pt  Chief Complaint:  Recurrent right pleural effusion, lung cancer  Subjective: Pt states he is breathing a little better this am; cont to have occ cough and did have small amt hemoptysis last night; some mild soreness at pleurx site   Allergies: Niacin  Medications: Prior to Admission medications   Medication Sig Start Date End Date Taking? Authorizing Provider  acetaminophen (TYLENOL) 650 MG CR tablet Take 1,300 mg by mouth every 8 (eight) hours as needed for pain.    Yes [provider]  CRESTOR 10 MG tablet Take 1 tablet (10 mg total) by mouth daily. 01/11/18  Yes Hoyt Koch, MD  diltiazem (CARTIA XT) 180 MG 24 hr capsule Take 1 capsule (180 mg total) by mouth 2 (two) times daily. 01/08/17  Yes Sherren Mocha, MD  etanercept (ENBREL SURECLICK) 50 MG/ML injection Inject 50 mg into the skin as directed. Inject on Sundays & Wednesdays    Yes [provider]  Flaxseed, Linseed, (FLAX SEED OIL) 1000 MG CAPS Take 1 capsule by mouth 2 (two) times daily.    Yes [provider]  Fluticasone-Umeclidin-Vilant (TRELEGY ELLIPTA) 100-62.5-25 MCG/INH AEPB Inhale 1 puff into the lungs daily. 10/14/17  Yes Hoyt Koch, MD  furosemide (LASIX) 80 MG tablet Take 1 tablet (80 mg total) by mouth 2 (two) times daily. 12/30/17  Yes Sherren Mocha, MD  Insulin Glargine (LANTUS SOLOSTAR) 100 UNIT/ML Solostar Pen Inject 30 Units into the skin daily at 10 pm. 12/08/17  Yes Elayne Snare, MD  ipratropium-albuterol (DUONEB) 0.5-2.5 (3) MG/3ML SOLN Take 3 mLs by nebulization every 4 (four) hours as needed. 12/29/17  Yes Hosie Poisson, MD  liraglutide (VICTOZA) 18 MG/3ML SOPN INJECT 0.3ML (=1.8MG )      SUBCUTANEOUSLY DAILY 07/26/17  Yes Elayne Snare, MD  losartan (COZAAR) 25 MG tablet TAKE 1 TABLET BY MOUTH ONCE DAILY 12/23/17  Yes Sherren Mocha, MD  metFORMIN  (GLUCOPHAGE) 1000 MG tablet TAKE 1 TABLET BY MOUTH TWICE DAILY WITH FOOD 12/20/17  Yes Elayne Snare, MD  metoprolol tartrate (LOPRESSOR) 100 MG tablet take1 tablet by mouth every morning and take 1 and 1/2 tablets by mouth every evening 08/03/17  Yes Sherren Mocha, MD  Multiple Vitamins-Minerals (CENTRUM ADULTS) TABS Take 1 tablet by mouth daily.   Yes [provider]  mupirocin ointment (BACTROBAN) 2 % Apply 1 application topically 2 (two) times daily as needed (skin).    Yes [provider]  nicotine (NICODERM CQ - DOSED IN MG/24 HOURS) 21 mg/24hr patch Place 1 patch (21 mg total) onto the skin daily. 12/30/17  Yes Hosie Poisson, MD  Nintedanib (OFEV) 150 MG CAPS Take 1 capsule (150 mg total) by mouth 2 (two) times daily. 10/29/17  Yes Rigoberto Noel, MD  Omega-3 Fatty Acids (FISH OIL) 1000 MG CAPS Take 1 capsule by mouth 2 (two) times daily.    Yes [provider]  potassium chloride SA (K-DUR,KLOR-CON) 20 MEQ tablet take 1 tablet by mouth twice a day 04/22/17  Yes Imogene Burn, PA-C  tamsulosin (FLOMAX) 0.4 MG CAPS capsule Take 0.4 mg by mouth daily.    Yes [provider]  triamcinolone cream (KENALOG) 0.1 % Apply 1 application topically 2 (two) times daily as needed (affected area/skin).    Yes [provider]  XARELTO 20 MG TABS tablet TAKE 1 TABLET BY MOUTH ONCE DAILY 01/17/18  Yes Sherren Mocha, MD  guaiFENesin (MUCINEX) 600 MG 12 hr tablet Take 1 tablet (600 mg total) by mouth 2 (two) times daily. Patient not taking: Reported on 01/11/2018 12/29/17   Hosie Poisson, MD  NOVOFINE 32G X 6 MM MISC use twice a day 03/22/17   Elayne Snare, MD  [facility] DELICA LANCETS FINE MISC TEST BLOOD SUGAR DAILY 03/22/17   Elayne Snare, MD  predniSONE (DELTASONE) 20 MG tablet Prednisone 60 mg daily for 2 days followed by  Prednisone 40 mg daily for 3 days followed by  Prednisone 20 mg daily for 3 days . Patient not taking: Reported on 01/24/2018 12/29/17   Hosie Poisson, MD    senna-docusate (SENOKOT-S) 8.6-50 MG tablet Take 1 tablet by mouth at bedtime as needed for mild constipation. Patient not taking: Reported on 01/11/2018 12/29/17   Hosie Poisson, MD     Vital Signs: BP 110/77   Pulse 97   Temp 97.7 F (36.5 C) (Oral)   Resp (!) 21   Ht 5\' 11"  (1.803 m)   Wt 270 lb (122.5 kg)   SpO2 93%   BMI 37.66 kg/m   Physical Exam rt pleurx cath intact, insertion site ok, no leaking or erythema, mildly tender to palpation  Imaging: Dg Chest 2 View  Result Date: 01/24/2018 CLINICAL DATA:  Shortness of breath.  Lung cancer.  COPD. EXAM: CHEST - 2 VIEW COMPARISON:  01/05/2018. FINDINGS: Marked worsening of BILATERAL pulmonary opacities, particularly on the RIGHT, with consolidation, atelectasis, and effusion. Asymmetric interstitial like opacity at the LEFT base is increased, without effusion or significant volume loss. Cardiomegaly. Thoracic atherosclerosis. IMPRESSION: Marked worsening aeration, with significant increase in RIGHT lung opacity, likely combination of atelectasis, infiltrate, and effusion, likely worsening metastatic disease. Electronically Signed   By: Neil Carroll M.D.   On: 01/24/2018 18:44   Ir Guided Niel Hummer W Catheter Placement  Result Date: 01/26/2018 INDICATION: 67 year old male with metastatic squamous cell carcinoma and a recurrent symptomatic malignant right pleural effusion. EXAM: Tunneled right pleural drainage catheter placement COMPARISON:  None. MEDICATIONS: The patient is currently admitted to the hospital and receiving intravenous antibiotics. The antibiotics were administered within an appropriate time frame prior to the initiation of the procedure. ANESTHESIA/SEDATION: Fentanyl 50 mcg IV; Versed 1 mg IV Moderate Sedation Time:  20 minutes The patient was continuously monitored during the procedure by the interventional radiology nurse under my direct supervision. COMPLICATIONS: None immediate. TECHNIQUE: Informed written consent was  obtained from the patient after a thorough discussion of the procedural risks, benefits and alternatives. All questions were addressed. Maximal Sterile Barrier Technique was utilized including caps, mask, sterile gowns, sterile gloves, sterile drape, hand hygiene and skin antiseptic. A timeout was performed prior to the initiation of the procedure. The right chest was interrogated with ultrasound. The largest fluid pocket was identified. Local anesthesia was attained by infiltration with 1% lidocaine. A small dermatotomy was made. Under real-time sonographic guidance, the fluid pocket was accessed with an 18 gauge sheath needle. The sheath was left in the pleural space and the needle was removed. A short Amplatz wire was advanced into the pleural space. A skin exit site was selected 5 cm superior and medial to the pleural entry site. Local anesthesia was again attained by infiltration with 1% lidocaine. A small dermatotomy was made. The tunneled PleurX drainage catheter was then tunneled from the skin exit site to the dermatotomy overlying the pleural access site. The peel-away sheath was then advanced over the wire and into the pleural space. The dilator  and wire were removed. The PleurX catheter was advanced through the peel-away sheath and the peel-away sheath was discarded. The PleurX catheter was connected to vacuum tender with evacuation of 1700 mL pleural fluid. The drainage catheter was secured to the skin with 0 Prolene suture. The dermatotomy overlying the pleural access site was closed with a single inverted interrupted 3-0 Vicryl suture in the epidermis was sealed with Dermabond. The patient tolerated the procedure well. PROCEDURE: As above. IMPRESSION: Successful placement of a right-sided tunneled pleural PleurX drainage catheter. Drainage at the time of placement yields 1.7 L pleural fluid. Signed, Criselda Peaches, MD [facility]  Electronically Signed   By: Jacqulynn Cadet M.D.   On: 01/26/2018 17:16   Ir US Guide Bx Asp/drain  Result Date: 01/26/2018 INDICATION: 67 year old male with metastatic squamous cell carcinoma and a recurrent symptomatic malignant right pleural effusion. EXAM: Tunneled right pleural drainage catheter placement COMPARISON:  None. MEDICATIONS: The patient is currently admitted to the hospital and receiving intravenous antibiotics. The antibiotics were administered within an appropriate time frame prior to the initiation of the procedure. ANESTHESIA/SEDATION: Fentanyl 50 mcg IV; Versed 1 mg IV Moderate Sedation Time:  20 minutes The patient was continuously monitored during the procedure by the interventional radiology nurse under my direct supervision. COMPLICATIONS: None immediate. TECHNIQUE: Informed written consent was obtained from the patient after a thorough discussion of the procedural risks, benefits and alternatives. All questions were addressed. Maximal Sterile Barrier Technique was utilized including caps, mask, sterile gowns, sterile gloves, sterile drape, hand hygiene and skin antiseptic. A timeout was performed prior to the initiation of the procedure. The right chest was interrogated with ultrasound. The largest fluid pocket was identified. Local anesthesia was attained by infiltration with 1% lidocaine. A small dermatotomy was made. Under real-time sonographic guidance, the fluid pocket was accessed with an 18 gauge sheath needle. The sheath was left in the pleural space and the needle was removed. A short Amplatz wire was advanced into the pleural space. A skin exit site was selected 5 cm superior and medial to the pleural entry site. Local anesthesia was again attained by infiltration with 1% lidocaine. A small dermatotomy was made. The tunneled PleurX drainage catheter was then tunneled from the skin exit site to the dermatotomy overlying the pleural access site. The peel-away sheath was then  advanced over the wire and into the pleural space. The dilator and wire were removed. The PleurX catheter was advanced through the peel-away sheath and the peel-away sheath was discarded. The PleurX catheter was connected to vacuum tender with evacuation of 1700 mL pleural fluid. The drainage catheter was secured to the skin with 0 Prolene suture. The dermatotomy overlying the pleural access site was closed with a single inverted interrupted 3-0 Vicryl suture in the epidermis was sealed with Dermabond. The patient tolerated the procedure well. PROCEDURE: As above. IMPRESSION: Successful placement of a right-sided tunneled pleural PleurX drainage catheter. Drainage at the time of placement yields 1.7 L pleural fluid. Signed, Criselda Peaches, MD [facility]iology Electronically Signed   By: Jacqulynn Cadet M.D.   On: 01/26/2018 17:16    Labs:  CBC: Recent Labs    01/10/18 1033 01/24/18 2002 01/25/18 0529 01/26/18 0259  WBC 11.3* 8.7 5.2 11.4*  HGB 16.2 15.3 12.4* 14.1  HCT 50.2* 47.2 38.9* 43.3  PLT 143 195 139* 185    COAGS: Recent Labs    02/25/17 0605 01/26/18  0071  INR 1.01 1.08  APTT 33  --     BMP: Recent Labs    01/24/18 2047 01/25/18 0529 01/25/18 1022 01/26/18 0259 01/27/18 0754  NA 141 138  --  141 142  K 4.0 5.4* 4.7 4.5 4.5  CL 100* 107  --  104 102  CO2 25 22  --  25 29  GLUCOSE 133* 205*  --  262* 271*  BUN 14 15  --  26* 30*  CALCIUM 9.3 7.9*  --  8.7* 8.8*  CREATININE 1.19 0.98  --  1.36* 1.21  GFRNONAA >60 >60  --  52* >60  GFRAA >60 >60  --  >60 >60    LIVER FUNCTION TESTS: Recent Labs    12/24/17 1546 12/25/17 0342 01/10/18 1033 01/24/18 2047  BILITOT 0.7 0.5 0.6 0.6  AST 21 21 13 25   ALT 22 25 27 28   ALKPHOS 57 50 51 67  PROT 7.8 7.3 6.8 8.3*  ALBUMIN 3.6 3.3* 3.3* 3.3*    Assessment and Plan: Pt with hx recurrent lung cancer and malignant rt pleural effusion; s/p rt pleurx 5/8  with 1.7 liters removed; drain pleurx prn based on pt's symptoms; further plans as per Dr. Julien Nordmann   Electronically Signed: D. Rowe Robert, PA-C 01/27/2018, 10:22 AM   I spent a total of 15 minutes at the the patient's bedside AND on the patient's hospital floor or unit, greater than 50% of which was counseling/coordinating care for right pleurx catheter    Patient ID: Neil Mam., male   DOB: [birth year], 67 y.o.   MRN: [id number]

## 2018-01-27 NOTE — Consult Note (Signed)
[place] Nurse wound consult note Reason for Consult: Unna boot placement orders have been entered to be completed today. The patient tolerated the ACE wraps that were placed 5/8 very well.  His respiratory status has improved, and he is requiring much less oxygen support today.  We are ready to initiate unna boots bilaterally.  Orders have been placed and the patient is aware. Monitor the wound area(s) for worsening of condition such as: Signs/symptoms of infection,  Increase in size,  Development of or worsening of odor, Development of pain, or increased pain at the affected locations.  Notify the medical team if any of these develop.  Thank you for the consult.  Discussed plan of care with the patient and bedside nurse.  [place] nurse will not follow at this time.  Please re-consult the [place] team if needed.  Val Riles, RN, MSN, CWOCN, CNS-BC, pager [phone]

## 2018-01-27 NOTE — Progress Notes (Signed)
[facility] PROGRESS NOTE    Progress Note  Lavon Bothwell  EXB:[id number] DOB: 1951-02-14 DOA: 01/24/2018 PCP: Hoyt Koch, MD     Brief Narrative:   Georgann Housekeeper Sr. is an 67 y.o. male past medical history of A. fib on Xarelto, chronic diastolic heart failure, squamous cell carcinoma of the right lung, COPD oxygen dependent on 4 L who presents complaining of progressive worsening shortness of breath 1 week prior to admission, she was recently admitted and discharged on 12/29/2017 for shortness of breath and acute large pleural effusion, for which she underwent thoracocentesis during that admission, in the ED was found to be hypoxic chest x-ray show worsening a radiation of the right lung field with a possible infiltrate she was started empirically on IV vancomycin and cefepime  Assessment/Plan:   Acute on chronic respiratory failure with hypoxia (HCC) multifactorial secondary to recurrent malignant right pleural effusion, possibly postobstructive pneumonia versus healthcare associated pneumonia and COPD exacerbation. Patient is currently on nonrebreather speaking full sentences.  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 Status post ultrasound-guided Pleurx catheter placement with 1700 fluid 2D echo showed preserved EF Blood cultures negative till date Continue empirically on IV cefepime. Started on IV steroids Started on IV DuoNeb's.  Recurrent malignant right pleural effusion: Status post Pleurx catheter placement.  Acute on chronic diastolic heart failure: 2D echo showed preserved EF. Continue beta-blockers, statin and Xarelto. Continue IV Lasix  Chronic atrial fibrillation: Rate control asymptomatic CHA2DS2VASC score 5 Continue metoprolol for rate control and Cardizem.Alveda Reasons for anticoagulation.  Hyperlipidemia: Continue statins.  Type 2 diabetes mellitus with  vascular disease (HCC) A1c was 7.3 continue sliding scale insulin plus Lantus.  Hypertensive heart disease with CHF (congestive heart failure) (HCC) Continue current medication metoprolol and Lasix. Continue to hold losartan secondary to hyperkalemia.  Blood pressure has remained stable.  COPD exacerbation (HCC) Continue oxygen.  Inhalers, IV antibiotics and steroids.  Hyperkalemia: Losartan was discontinued 24 hours prior to admission his potassium is improved.    Chronic anticoagulation-Xarelto (CHADs VASc=5)  OSA/Obesity (BMI 30-39.9)   DVT prophylaxis: Xarelto Family Communication:none Disposition Plan/Barrier to D/C: unable to determine, transferred to [place] Code Status:     Code Status Orders  (From admission, onward)        Start     Ordered   01/25/18 0008  Full code  Continuous     01/25/18 0009    Code Status History    Date Active Date Inactive Code Status Order ID Comments User Context   12/24/2017 1805 12/29/2017 1640 Full Code [id number]  Doreatha Lew, MD ED   02/25/2017 1157 02/27/2017 1951 Full Code [id number]  Saverio Danker, PA-C Inpatient   02/25/2016 1915 02/29/2016 1608 Full Code [id number]  Bonnell Public, MD Inpatient   02/25/2016 1915 02/25/2016 1915 Full Code [id number]  Bonnell Public, MD Inpatient   11/10/2015 1638 11/12/2015 1510 Full Code [id number]  Samella Parr, NP Inpatient   07/10/2015 1259 07/10/2015 1904 Full Code [id number]  Sherren Mocha, MD Inpatient        IV Access:    Peripheral IV   Procedures and diagnostic studies:   Ir Lenise Arena W Catheter Placement  Result Date: 01/26/2018 INDICATION: 67 year old male with metastatic squamous cell carcinoma and a recurrent symptomatic malignant right pleural effusion. EXAM: Tunneled right pleural drainage catheter placement COMPARISON:  None. MEDICATIONS: The patient is currently admitted to the hospital and receiving intravenous  antibiotics. The antibiotics were administered  within an appropriate time frame prior to the initiation of the procedure. ANESTHESIA/SEDATION: Fentanyl 50 mcg IV; Versed 1 mg IV Moderate Sedation Time:  20 minutes The patient was continuously monitored during the procedure by the interventional radiology nurse under my direct supervision. COMPLICATIONS: None immediate. TECHNIQUE: Informed written consent was obtained from the patient after a thorough discussion of the procedural risks, benefits and alternatives. All questions were addressed. Maximal Sterile Barrier Technique was utilized including caps, mask, sterile gowns, sterile gloves, sterile drape, hand hygiene and skin antiseptic. A timeout was performed prior to the initiation of the procedure. The right chest was interrogated with ultrasound. The largest fluid pocket was identified. Local anesthesia was attained by infiltration with 1% lidocaine. A small dermatotomy was made. Under real-time sonographic guidance, the fluid pocket was accessed with an 18 gauge sheath needle. The sheath was left in the pleural space and the needle was removed. A short Amplatz wire was advanced into the pleural space. A skin exit site was selected 5 cm superior and medial to the pleural entry site. Local anesthesia was again attained by infiltration with 1% lidocaine. A small dermatotomy was made. The tunneled PleurX drainage catheter was then tunneled from the skin exit site to the dermatotomy overlying the pleural access site. The peel-away sheath was then advanced over the wire and into the pleural space. The dilator and wire were removed. The PleurX catheter was advanced through the peel-away sheath and the peel-away sheath was discarded. The PleurX catheter was connected to vacuum tender with evacuation of 1700 mL pleural fluid. The drainage catheter was secured to the skin with 0 Prolene suture. The dermatotomy overlying the pleural access site was closed with a single inverted interrupted 3-0 Vicryl suture in the  epidermis was sealed with Dermabond. The patient tolerated the procedure well. PROCEDURE: As above. IMPRESSION: Successful placement of a right-sided tunneled pleural PleurX drainage catheter. Drainage at the time of placement yields 1.7 L pleural fluid. Signed, Criselda Peaches, MD [facility] Radiology Electronically Signed   By: Jacqulynn Cadet M.D.   On: 01/26/2018 17:16   Ir US Guide Bx Asp/drain  Result Date: 01/26/2018 INDICATION: 67 year old male with metastatic squamous cell carcinoma and a recurrent symptomatic malignant right pleural effusion. EXAM: Tunneled right pleural drainage catheter placement COMPARISON:  None. MEDICATIONS: The patient is currently admitted to the hospital and receiving intravenous antibiotics. The antibiotics were administered within an appropriate time frame prior to the initiation of the procedure. ANESTHESIA/SEDATION: Fentanyl 50 mcg IV; Versed 1 mg IV Moderate Sedation Time:  20 minutes The patient was continuously monitored during the procedure by the interventional radiology nurse under my direct supervision. COMPLICATIONS: None immediate. TECHNIQUE: Informed written consent was obtained from the patient after a thorough discussion of the procedural risks, benefits and alternatives. All questions were addressed. Maximal Sterile Barrier Technique was utilized including caps, mask, sterile gowns, sterile gloves, sterile drape, hand hygiene and skin antiseptic. A timeout was performed prior to the initiation of the procedure. The right chest was interrogated with ultrasound. The largest fluid pocket was identified. Local anesthesia was attained by infiltration with 1% lidocaine. A small dermatotomy was made. Under real-time sonographic guidance, the fluid pocket was accessed with an 18 gauge sheath needle. The sheath was left in the pleural space and the needle was removed. A short Amplatz wire was advanced into the  pleural space. A skin exit site was selected 5 cm superior  and medial to the pleural entry site. Local anesthesia was again attained by infiltration with 1% lidocaine. A small dermatotomy was made. The tunneled PleurX drainage catheter was then tunneled from the skin exit site to the dermatotomy overlying the pleural access site. The peel-away sheath was then advanced over the wire and into the pleural space. The dilator and wire were removed. The PleurX catheter was advanced through the peel-away sheath and the peel-away sheath was discarded. The PleurX catheter was connected to vacuum tender with evacuation of 1700 mL pleural fluid. The drainage catheter was secured to the skin with 0 Prolene suture. The dermatotomy overlying the pleural access site was closed with a single inverted interrupted 3-0 Vicryl suture in the epidermis was sealed with Dermabond. The patient tolerated the procedure well. PROCEDURE: As above. IMPRESSION: Successful placement of a right-sided tunneled pleural PleurX drainage catheter. Drainage at the time of placement yields 1.7 L pleural fluid. Signed, Criselda Peaches, MD [facility] Radiology Electronically Signed   By: Jacqulynn Cadet M.D.   On: 01/26/2018 17:16     Medical Consultants:    None.  Anti-Infectives:   IV vancomycin and cefepime.  Subjective:    [place] he relates his breathing is slightly better compared to yesterday. Objective:    Vitals:   01/27/18 0200 01/27/18 0300 01/27/18 0334 01/27/18 0354  BP:   106/69   Pulse: 85 87 (!) 30   Resp: 12 17 (!) 21   Temp:      TempSrc:      SpO2: 97% 94% (!) 82% 93%  Weight:      Height:        Intake/Output Summary (Last 24 hours) at 01/27/2018 0741 Last data filed at 01/27/2018 0600 Gross per 24 hour  Intake 1280 ml  Output 2925 ml  Net -1645 ml   Filed Weights   01/24/18 1732 01/24/18 2242  Weight: 122.5 kg (270 lb) 122.5 kg  (270 lb)    Exam: General exam: In no acute distress. Respiratory system: Good air movement with wheezing on the left decreased sounds on the right lower lung Cardiovascular system: S1 & S2 heard, RRR.  Positive hepatojugular reflux 3+ edema Gastrointestinal system: Abdomen is nondistended, soft and nontender.  Central nervous system: Alert and oriented. No focal neurological deficits. Extremities: 3+ edema with chronic venous stasis changes Skin: No rashes, lesions or ulcers Psychiatry: Judgement and insight appear normal. Mood & affect appropriate.    Data Reviewed:    Labs: Basic Metabolic Panel: Recent Labs  Lab 01/24/18 2047 01/25/18 0529 01/25/18 1022 01/26/18 0259  NA 141 138  --  141  K 4.0 5.4* 4.7 4.5  CL 100* 107  --  104  CO2 25 22  --  25  GLUCOSE 133* 205*  --  262*  BUN 14 15  --  26*  CREATININE 1.19 0.98  --  1.36*  CALCIUM 9.3 7.9*  --  8.7*  MG  --   --   --  2.0   GFR Estimated Creatinine Clearance: 70.2 mL/min (A) (by C-G formula based on SCr of 1.36 mg/dL (H)). Liver Function Tests: Recent Labs  Lab 01/24/18 2047  AST 25  ALT 28  ALKPHOS 67  BILITOT 0.6  PROT 8.3*  ALBUMIN 3.3*   No results for input(s): LIPASE, AMYLASE in the last 168 hours. No results for input(s): AMMONIA in the last 168 hours. Coagulation profile Recent Labs  Lab 01/26/18  3614  INR 1.08    CBC: Recent Labs  Lab 01/24/18 2002 01/25/18 0529 01/26/18 0259  WBC 8.7 5.2 11.4*  NEUTROABS  --   --  10.0*  HGB 15.3 12.4* 14.1  HCT 47.2 38.9* 43.3  MCV 92.0 92.6 92.1  PLT 195 139* 185   Cardiac Enzymes: Recent Labs  Lab 01/24/18 2047 01/25/18 1022 01/25/18 1537 01/25/18 2149  TROPONINI <0.03 <0.03 <0.03 <0.03   BNP (last 3 results) Recent Labs    10/13/17 1202 10/21/17 1203  PROBNP 992* 962*   CBG: Recent Labs  Lab 01/25/18 2103 01/26/18 0828 01/26/18 1246 01/26/18 1721 01/26/18 2142  GLUCAP 240* 237* 217* 179* 331*   D-Dimer: No results  for input(s): DDIMER in the last 72 hours. Hgb A1c: No results for input(s): HGBA1C in the last 72 hours. Lipid Profile: No results for input(s): CHOL, HDL, LDLCALC, TRIG, CHOLHDL, LDLDIRECT in the last 72 hours. Thyroid function studies: Recent Labs    01/25/18 1022  TSH 0.292*   Anemia work up: No results for input(s): VITAMINB12, FOLATE, FERRITIN, TIBC, IRON, RETICCTPCT in the last 72 hours. Sepsis Labs: Recent Labs  Lab 01/24/18 1938 01/24/18 2002 01/24/18 2113 01/25/18 0134 01/25/18 0529 01/26/18 0259  PROCALCITON  --   --   --   --  <0.10  --   WBC  --  8.7  --   --  5.2 11.4*  LATICACIDVEN 2.06*  --  3.47* 2.3* 1.6  --    Microbiology Recent Results (from the past 240 hour(s))  Blood culture (routine x 2)     Status: None (Preliminary result)   Collection Time: 01/24/18  8:02 PM  Result Value Ref Range Status   Specimen Description   Final    BLOOD RIGHT HAND Performed at [facility], [place] [address] [place], [place] [address]    Special Requests   Final    AEROBIC BOTTLE ONLY Blood Culture adequate volume Performed at [place] [address] [place], [place] [address]    Culture   Final    NO GROWTH 2 DAYS Performed at [place] [address], [place], [place] [address]    Report Status PENDING  Incomplete  MRSA PCR Screening     Status: None   Collection Time: 01/25/18  8:50 AM  Result Value Ref Range Status   MRSA by PCR NEGATIVE NEGATIVE Final    Comment:        The GeneXpert MRSA Assay (FDA approved for NASAL specimens only), is one component of a comprehensive MRSA colonization surveillance program. It is not intended to diagnose MRSA infection nor to guide or monitor treatment for MRSA infections. Performed at [facility], [place] [address], [place], [place] [address]   Culture, Urine     Status: None   Collection Time: 01/25/18  9:14 AM  Result Value Ref Range Status     Specimen Description   Final    URINE, CLEAN CATCH Performed at [facility], [place] [address] [place], [place] [address]    Special Requests   Final    NONE Performed at [facility], [place] [address], [place], [place] [address]    Culture   Final    NO GROWTH Performed at [facility], [place] [address], [place], [place] [address]    Report Status 01/26/2018 FINAL  Final     Medications:   . ammonium lactate  Topical q morning - 10a  . arformoterol  15 mcg Nebulization BID  . budesonide (PULMICORT) nebulizer solution  0.5 mg Nebulization BID  . diltiazem  180 mg Oral Daily  . furosemide  80 mg Intravenous Q12H  . guaiFENesin  1,200 mg Oral BID  . insulin aspart  0-15 Units Subcutaneous TID WC  . insulin glargine  30 Units Subcutaneous Q2200  . ipratropium  0.5 mg Nebulization Q6H  . levalbuterol  0.63 mg Nebulization Q6H  . loratadine  10 mg Oral Daily  . methylPREDNISolone (SOLU-MEDROL) injection  60 mg Intravenous Q8H  . metoprolol tartrate  100 mg Oral Daily  . metoprolol tartrate  150 mg Oral QHS  . nicotine  21 mg Transdermal Daily  . pantoprazole  40 mg Oral Q0600  . rivaroxaban  20 mg Oral Q supper  . rosuvastatin  10 mg Oral Daily  . tamsulosin  0.4 mg Oral Daily   Continuous Infusions: . ceFEPime (MAXIPIME) IV Stopped (01/27/18 0329)  . vancomycin Stopped (01/27/18 0222)       LOS: 2 days   [place] [facility] Pager [phone]  *Please refer to [email or web address], password [email or web address] to get updated schedule on who will round on this patient, as hospitalists switch teams weekly. If 7PM-7AM, please contact night-coverage at [email or web address], password [email or web address] for any overnight needs.  01/27/2018, 7:41 AM

## 2018-01-28 ENCOUNTER — Other Ambulatory Visit: Payer: Medicare Other

## 2018-01-28 ENCOUNTER — Telehealth: Payer: Self-pay

## 2018-01-28 ENCOUNTER — Inpatient Hospital Stay: Payer: Medicare Other | Attending: Internal Medicine | Admitting: Internal Medicine

## 2018-01-28 DIAGNOSIS — J449 Chronic obstructive pulmonary disease, unspecified: Secondary | ICD-10-CM | POA: Insufficient documentation

## 2018-01-28 DIAGNOSIS — F17210 Nicotine dependence, cigarettes, uncomplicated: Secondary | ICD-10-CM | POA: Insufficient documentation

## 2018-01-28 DIAGNOSIS — I739 Peripheral vascular disease, unspecified: Secondary | ICD-10-CM | POA: Insufficient documentation

## 2018-01-28 DIAGNOSIS — E119 Type 2 diabetes mellitus without complications: Secondary | ICD-10-CM | POA: Insufficient documentation

## 2018-01-28 DIAGNOSIS — E785 Hyperlipidemia, unspecified: Secondary | ICD-10-CM | POA: Insufficient documentation

## 2018-01-28 DIAGNOSIS — K219 Gastro-esophageal reflux disease without esophagitis: Secondary | ICD-10-CM | POA: Insufficient documentation

## 2018-01-28 DIAGNOSIS — Z5111 Encounter for antineoplastic chemotherapy: Secondary | ICD-10-CM | POA: Insufficient documentation

## 2018-01-28 DIAGNOSIS — I10 Essential (primary) hypertension: Secondary | ICD-10-CM | POA: Insufficient documentation

## 2018-01-28 DIAGNOSIS — C782 Secondary malignant neoplasm of pleura: Secondary | ICD-10-CM | POA: Insufficient documentation

## 2018-01-28 DIAGNOSIS — C3411 Malignant neoplasm of upper lobe, right bronchus or lung: Secondary | ICD-10-CM | POA: Insufficient documentation

## 2018-01-28 DIAGNOSIS — Z86010 Personal history of colonic polyps: Secondary | ICD-10-CM | POA: Insufficient documentation

## 2018-01-28 DIAGNOSIS — I351 Nonrheumatic aortic (valve) insufficiency: Secondary | ICD-10-CM | POA: Insufficient documentation

## 2018-01-28 DIAGNOSIS — Z79899 Other long term (current) drug therapy: Secondary | ICD-10-CM | POA: Insufficient documentation

## 2018-01-28 LAB — BASIC METABOLIC PANEL
ANION GAP: 13 (ref 5–15)
BUN: 31 mg/dL — ABNORMAL HIGH (ref 6–20)
CALCIUM: 8 mg/dL — AB (ref 8.9–10.3)
CHLORIDE: 100 mmol/L — AB (ref 101–111)
CO2: 25 mmol/L (ref 22–32)
Creatinine, Ser: 1.14 mg/dL (ref 0.61–1.24)
GFR calc Af Amer: >60 mL/min (ref >60)
GFR calc non Af Amer: >60 mL/min (ref >60)
GLUCOSE: 347 mg/dL — AB (ref 65–99)
Potassium: 4.3 mmol/L (ref 3.5–5.1)
Sodium: 138 mmol/L (ref 135–145)

## 2018-01-28 LAB — GLUCOSE, CAPILLARY
Glucose-Capillary: 304 mg/dL — ABNORMAL HIGH (ref 65–99)
Glucose-Capillary: 389 mg/dL — ABNORMAL HIGH (ref 65–99)

## 2018-01-28 MED ORDER — INSULIN ASPART 100 UNIT/ML ~~LOC~~ SOLN: 0.0000 [IU] | Freq: Every day | SUBCUTANEOUS | Status: DC

## 2018-01-28 MED ORDER — INSULIN ASPART 100 UNIT/ML ~~LOC~~ SOLN
6.0000 [IU] | Freq: Three times a day (TID) | SUBCUTANEOUS | Status: DC
  Administered 2018-01-28: 6 [IU] via SUBCUTANEOUS

## 2018-01-28 MED ORDER — INSULIN ASPART 100 UNIT/ML ~~LOC~~ SOLN
0.0000 [IU] | Freq: Three times a day (TID) | SUBCUTANEOUS | Status: DC
  Administered 2018-01-28: 20 [IU] via SUBCUTANEOUS

## 2018-01-28 MED ORDER — INSULIN GLARGINE 100 UNIT/ML ~~LOC~~ SOLN
10.0000 [IU] | Freq: Once | SUBCUTANEOUS | Status: AC
  Administered 2018-01-28: 10 [IU] via SUBCUTANEOUS
  Filled 2018-01-28: qty 0.1

## 2018-01-28 MED ORDER — DOXYCYCLINE HYCLATE 50 MG PO CAPS: 50.0000 mg | ORAL_CAPSULE | Freq: Two times a day (BID) | ORAL | 0 refills | Status: AC

## 2018-01-28 MED ORDER — FUROSEMIDE 10 MG/ML IJ SOLN
80.0000 mg | Freq: Once | INTRAMUSCULAR | Status: AC
  Administered 2018-01-28: 80 mg via INTRAVENOUS
  Filled 2018-01-28: qty 8

## 2018-01-28 MED ORDER — LEVALBUTEROL HCL 0.63 MG/3ML IN NEBU: 0.6300 mg | INHALATION_SOLUTION | RESPIRATORY_TRACT | Status: DC | PRN

## 2018-01-28 NOTE — Care Management Note (Signed)
Case Management Note  Patient Details  Name: Neil Carroll. MRN: [id number] Date of Birth: Nov 19, 1950  Pt to dc home with new pleurex drain and una boots. Home health RN needed for assistance with both at home. MD orders received. Choice offered and [facility] chosen. [facility] rep alerted of referral. Pt to be sent home with a box of 10 drainage containers for the Pleurex. This CM has faxed Pleurex Drainage Kit order form for additional kits to Diane at Dr. Su Hoff office to fill out and fax back to me. Will await order form and fax to [place] when completed.  Lynnell Catalan, RN 01/28/2018, 11:25 AM  [phone]

## 2018-01-28 NOTE — Care Management Important Message (Signed)
Important Message  Patient Details  Name: Neil BEAMER Sr. MRN: [id number] Date of Birth: 23-May-1951   Medicare Important Message Given:  Yes    Kerin Salen 01/28/2018, 12:41 [place] Message  Patient Details  Name: Neil POPP Sr. MRN: [id number] Date of Birth: 03-27-1951   Medicare Important Message Given:  Yes    Kerin Salen 01/28/2018, 12:40 PM

## 2018-01-28 NOTE — Discharge Summary (Signed)
Physician Discharge Summary  Neil Carroll UUV:[id number] DOB: [birth year] DOA: 01/24/2018  PCP: Hoyt Koch, MD  Admit date: 01/24/2018 Discharge date: 01/28/2018  Admitted From: home Disposition:  Home  Recommendations for Outpatient Follow-up:  1. Follow up with Oncology in 1-2 weeks 2. Please obtain BMP/CBC in one week   Home Health:Yes Equipment/Devices:none  Discharge Condition:stable CODE STATUS:full Diet recommendation: Heart Healthy   Brief/Interim Summary: 67 y.o. male past medical history of A. fib on Xarelto, chronic diastolic heart failure, squamous cell carcinoma of the right lung, COPD oxygen dependent on 4 L who presents complaining of progressive worsening shortness of breath 1 week prior to admission, she was recently admitted and discharged on 12/29/2017 for shortness of breath and acute large pleural effusion, for which she underwent thoracocentesis during that admission, in the ED was found to be hypoxic chest x-ray show worsening a radiation of the right lung field with a possible infiltrate she was started empirically on IV vancomycin and cefepime    Discharge Diagnoses:  Principal Problem:   Acute on chronic respiratory failure with hypoxia ([place]) Active Problems:   Type 2 diabetes mellitus with vascular disease ([place])   Hyperlipidemia   Hypertensive heart disease with CHF (congestive heart failure) (HCC)   PVD (peripheral vascular disease) with claudication ([place])   COPD exacerbation ([place])   Coronary artery disease involving coronary bypass graft of native heart with angina pectoris (HCC)   Persistent atrial fibrillation (HCC)   Acute on chronic diastolic CHF (congestive heart failure) (HCC)   Chronic anticoagulation-Xarelto (CHADs VASc=5)   OSA (obstructive sleep apnea)   Obesity (BMI 30-39.9)   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 After 3 days of IV antibiotics, he was changed to oral antibiotics which he will continue at home for 3 additional days. Been wean off the steroids  Acute on chronic diastolic heart failure: 2D echo showed preserved EF not able to evaluate diastolic dysfunction. He was continue beta-blockers and Xarelto. He was changed to oral Lasix which she will continue as an outpatient.  Chronic atrial fibrillation asymptomatic CHA2DS2VASC score 5 Continue metoprolol for rate control and Cardizem.Alveda Reasons for anticoagulation.  Hyperlipidemia: Continue statins.  Diabetes mellitus type 2 with vascular disease: No changes made to his medication.  Hypertensive heart disease with heart failure: No changes made to his medication.  COPD exacerbation : we will continue oxygen at home and will continue doxy for 3 additional days.  Hyperkalemia: Losartan was stopped his hyperkalemia resolved likely a due losartan. He was treated with IV Lasix and his hyperkalemia resolved. He will resume losartan as an outpatient.   Discharge Instructions  Discharge Instructions    Diet - low sodium heart healthy   Complete by:  As directed    Increase activity slowly   Complete by:  As directed      Allergies as of 01/28/2018      Reactions   Niacin Nausea Only   REACTION: upset stomach      Medication List    STOP taking these medications   acetaminophen 650 MG CR tablet Commonly known as:  TYLENOL  TAKE these medications   CENTRUM ADULTS Tabs Take 1 tablet by mouth daily.   CRESTOR 10 MG tablet Generic drug:  rosuvastatin Take 1 tablet (10 mg total) by mouth daily.   diltiazem 180 MG 24 hr  capsule Commonly known as:  CARTIA XT Take 1 capsule (180 mg total) by mouth 2 (two) times daily.   ENBREL SURECLICK 50 MG/ML injection Generic drug:  etanercept Inject 50 mg into the skin as directed. Inject on Sundays & Wednesdays   Fish Oil 1000 MG Caps Take 1 capsule by mouth 2 (two) times daily.   Flax Seed Oil 1000 MG Caps Take 1 capsule by mouth 2 (two) times daily.   Fluticasone-Umeclidin-Vilant 100-62.5-25 MCG/INH Aepb Commonly known as:  TRELEGY ELLIPTA Inhale 1 puff into the lungs daily.   furosemide 80 MG tablet Commonly known as:  LASIX Take 1 tablet (80 mg total) by mouth 2 (two) times daily.   guaiFENesin 600 MG 12 hr tablet Commonly known as:  MUCINEX Take 1 tablet (600 mg total) by mouth 2 (two) times daily.   Insulin Glargine 100 UNIT/ML Solostar Pen Commonly known as:  LANTUS SOLOSTAR Inject 30 Units into the skin daily at 10 pm.   ipratropium-albuterol 0.5-2.5 (3) MG/3ML Soln Commonly known as:  DUONEB Take 3 mLs by nebulization every 4 (four) hours as needed.   liraglutide 18 MG/3ML Sopn Commonly known as:  VICTOZA INJECT 0.3ML (=1.8MG )      SUBCUTANEOUSLY DAILY   losartan 25 MG tablet Commonly known as:  COZAAR TAKE 1 TABLET BY MOUTH ONCE DAILY   metFORMIN 1000 MG tablet Commonly known as:  GLUCOPHAGE TAKE 1 TABLET BY MOUTH TWICE DAILY WITH FOOD   metoprolol tartrate 100 MG tablet Commonly known as:  LOPRESSOR take1 tablet by mouth every morning and take 1 and 1/2 tablets by mouth every evening   mupirocin ointment 2 % Commonly known as:  BACTROBAN Apply 1 application topically 2 (two) times daily as needed (skin).   nicotine 21 mg/24hr patch Commonly known as:  NICODERM CQ - dosed in mg/24 hours Place 1 patch (21 mg total) onto the skin daily.   Nintedanib 150 MG Caps Commonly known as:  OFEV Take 1 capsule (150 mg total) by mouth 2 (two) times daily.   NOVOFINE 32G X 6 MM Misc Generic drug:  Insulin Pen Needle use twice a day    ONETOUCH DELICA LANCETS FINE Misc TEST BLOOD SUGAR DAILY   potassium chloride SA 20 MEQ tablet Commonly known as:  K-DUR,KLOR-CON take 1 tablet by mouth twice a day   predniSONE 20 MG tablet Commonly known as:  DELTASONE Prednisone 60 mg daily for 2 days followed by  Prednisone 40 mg daily for 3 days followed by  Prednisone 20 mg daily for 3 days .   senna-docusate 8.6-50 MG tablet Commonly known as:  Senokot-S Take 1 tablet by mouth at bedtime as needed for mild constipation.   tamsulosin 0.4 MG Caps capsule Commonly known as:  FLOMAX Take 0.4 mg by mouth daily.   triamcinolone cream 0.1 % Commonly known as:  KENALOG Apply 1 application topically 2 (two) times daily as needed (affected area/skin).   XARELTO 20 MG Tabs tablet Generic drug:  rivaroxaban TAKE 1 TABLET BY MOUTH ONCE DAILY       Allergies  Allergen Reactions  . Niacin Nausea Only    REACTION: upset stomach    Consultations: None  Procedures/Studies: Dg Chest 2 View  Result Date: 01/24/2018 CLINICAL DATA:  Shortness of breath.  Lung cancer.  COPD. EXAM: CHEST - 2 VIEW COMPARISON:  01/05/2018. FINDINGS: Marked worsening of BILATERAL pulmonary opacities, particularly on the RIGHT, with consolidation, atelectasis, and effusion. Asymmetric interstitial like opacity at the LEFT base is increased, without effusion or significant volume loss. Cardiomegaly. Thoracic atherosclerosis. IMPRESSION: Marked worsening aeration, with significant increase in RIGHT lung opacity, likely combination of atelectasis, infiltrate, and effusion, likely worsening metastatic disease. Electronically Signed   By: Staci Righter M.D.   On: 01/24/2018 18:44   Dg Chest 2 View  Result Date: 01/05/2018 CLINICAL DATA:  Follow-up pleural effusion. EXAM: CHEST - 2 VIEW COMPARISON:  Chest radiograph 12/28/2017. CT chest 12/24/2017. FINDINGS: Cardiomegaly. RIGHT pleural effusion remains improved. Stable interstitial densities throughout both  lungs, RIGHT greater than LEFT. Stable opacity seen laterally in RIGHT upper lobe, described on prior CT scan. Bony thorax unremarkable. Aortic atherosclerosis. IMPRESSION: Stable chest. No pneumothorax. No new areas of consolidation or edema. Electronically Signed   By: Staci Righter M.D.   On: 01/05/2018 10:08   Ir Guided Niel Hummer W Catheter Placement  Result Date: 01/26/2018 INDICATION: 67 year old male with metastatic squamous cell carcinoma and a recurrent symptomatic malignant right pleural effusion. EXAM: Tunneled right pleural drainage catheter placement COMPARISON:  None. MEDICATIONS: The patient is currently admitted to the hospital and receiving intravenous antibiotics. The antibiotics were administered within an appropriate time frame prior to the initiation of the procedure. ANESTHESIA/SEDATION: Fentanyl 50 mcg IV; Versed 1 mg IV Moderate Sedation Time:  20 minutes The patient was continuously monitored during the procedure by the interventional radiology nurse under my direct supervision. COMPLICATIONS: None immediate. TECHNIQUE: Informed written consent was obtained from the patient after a thorough discussion of the procedural risks, benefits and alternatives. All questions were addressed. Maximal Sterile Barrier Technique was utilized including caps, mask, sterile gowns, sterile gloves, sterile drape, hand hygiene and skin antiseptic. A timeout was performed prior to the initiation of the procedure. The right chest was interrogated with ultrasound. The largest fluid pocket was identified. Local anesthesia was attained by infiltration with 1% lidocaine. A small dermatotomy was made. Under real-time sonographic guidance, the fluid pocket was accessed with an 18 gauge sheath needle. The sheath was left in the pleural space and the needle was removed. A short Amplatz wire was advanced into the pleural space. A skin exit site was selected 5 cm superior and medial to the pleural entry site. Local  anesthesia was again attained by infiltration with 1% lidocaine. A small dermatotomy was made. The tunneled PleurX drainage catheter was then tunneled from the skin exit site to the dermatotomy overlying the pleural access site. The peel-away sheath was then advanced over the wire and into the pleural space. The dilator and wire were removed. The PleurX catheter was advanced through the peel-away sheath and the peel-away sheath was discarded. The PleurX catheter was connected to vacuum tender with evacuation of 1700 mL pleural fluid. The drainage catheter was secured to the skin with 0 Prolene suture. The dermatotomy overlying the pleural access site was closed with a single inverted interrupted 3-0 Vicryl suture in the epidermis was sealed with Dermabond. The patient tolerated the procedure well. PROCEDURE: As above. IMPRESSION: Successful placement of a right-sided tunneled pleural PleurX drainage catheter. Drainage at the time of placement yields 1.7 L pleural fluid. Signed, Criselda Peaches, MD [facility] Electronically Signed   By: Jacqulynn Cadet M.D.   On: 01/26/2018 17:16   Nm Pet  Image Restag (ps) Skull Base To Thigh  Result Date: 01/14/2018 CLINICAL DATA:  Subsequent treatment strategy for non-small-cell lung cancer. Malignant right pleural effusion on thoracentesis 12/28/2017. EXAM: NUCLEAR MEDICINE PET SKULL BASE TO THIGH TECHNIQUE: 13.69 mCi F-18 FDG was injected intravenously. Full-ring PET imaging was performed from the skull base to thigh after the radiotracer. CT data was obtained and used for attenuation correction and anatomic localization. Fasting blood glucose: 149 mg/dl COMPARISON:  PET-CT 02/09/2017.  Chest CT 12/24/2017. FINDINGS: Mediastinal blood pool activity: SUV max 2.5 NECK: No hypermetabolic cervical lymph nodes are identified.There are no lesions of the pharyngeal mucosal space. Incidental CT findings: Mild carotid  atherosclerosis bilaterally. CHEST: There are no hypermetabolic mediastinal, hilar or axillary lymph nodes. There is multifocal hypermetabolic activity within the right pleural space, especially posteriorly where there is a focal area of hypermetabolic activity extending 8.2 cm cephalocaudad and having an SUV max of 8.9. There is no abnormal metabolic activity within the left pleural space. There is no significant hypermetabolic activity within the irradiated right upper lobe lesion. However, there is a small focus of right upper lobe parenchymal hypermetabolic activity (SUV max 4.4) more anteriorly. Incidental CT findings: The right pleural effusion has decreased in volume compared with the recent chest CT. Probable radiation changes in the right lung superimposed on underlying pulmonary fibrosis and emphysema are grossly stable. ABDOMEN/PELVIS: There is no hypermetabolic activity within the liver, adrenal glands, spleen or pancreas. There is no hypermetabolic nodal activity. Incidental CT findings: Borderline hepatic steatosis. Aortic and branch vessel atherosclerosis, small hiatal hernia and mild enlargement of the prostate gland. SKELETON: There is no hypermetabolic activity to suggest osseous metastatic disease. Incidental CT findings: Previous bilateral total hip arthroplasty. IMPRESSION: 1. Hypermetabolic pleural activity in the right hemithorax, especially posteriorly, corresponding with known malignant pleural effusion. The pleural effusion has decreased in volume compared with the diagnostic CT of 3 weeks ago. 2. The irradiated right upper lobe lesion demonstrates no significant residual focal hypermetabolic activity. 3. No distant metastases identified. Electronically Signed   By: Richardean Sale M.D.   On: 01/14/2018 09:26   Ir US Guide Bx Asp/drain  Result Date: 01/26/2018 INDICATION: 67 year old male with metastatic squamous cell carcinoma and a recurrent symptomatic malignant right pleural  effusion. EXAM: Tunneled right pleural drainage catheter placement COMPARISON:  None. MEDICATIONS: The patient is currently admitted to the hospital and receiving intravenous antibiotics. The antibiotics were administered within an appropriate time frame prior to the initiation of the procedure. ANESTHESIA/SEDATION: Fentanyl 50 mcg IV; Versed 1 mg IV Moderate Sedation Time:  20 minutes The patient was continuously monitored during the procedure by the interventional radiology nurse under my direct supervision. COMPLICATIONS: None immediate. TECHNIQUE: Informed written consent was obtained from the patient after a thorough discussion of the procedural risks, benefits and alternatives. All questions were addressed. Maximal Sterile Barrier Technique was utilized including caps, mask, sterile gowns, sterile gloves, sterile drape, hand hygiene and skin antiseptic. A timeout was performed prior to the initiation of the procedure. The right chest was interrogated with ultrasound. The largest fluid pocket was identified. Local anesthesia was attained by infiltration with 1% lidocaine. A small dermatotomy was made. Under real-time sonographic guidance, the fluid pocket was accessed with an 18 gauge sheath needle. The sheath was left in the pleural space and the needle was removed. A short Amplatz wire was advanced into the pleural space. A skin exit site was selected 5 cm superior and medial to the pleural entry site. Local anesthesia  was again attained by infiltration with 1% lidocaine. A small dermatotomy was made. The tunneled PleurX drainage catheter was then tunneled from the skin exit site to the dermatotomy overlying the pleural access site. The peel-away sheath was then advanced over the wire and into the pleural space. The dilator and wire were removed. The PleurX catheter was advanced through the peel-away sheath and the peel-away sheath was discarded. The PleurX catheter was connected to vacuum tender with  evacuation of 1700 mL pleural fluid. The drainage catheter was secured to the skin with 0 Prolene suture. The dermatotomy overlying the pleural access site was closed with a single inverted interrupted 3-0 Vicryl suture in the epidermis was sealed with Dermabond. The patient tolerated the procedure well. PROCEDURE: As above. IMPRESSION: Successful placement of a right-sided tunneled pleural PleurX drainage catheter. Drainage at the time of placement yields 1.7 L pleural fluid. Signed, Criselda Peaches, MD [facility] Radiology Electronically Signed   By: Jacqulynn Cadet M.D.   On: 01/26/2018 17:16     Subjective: No complaints feels great.  Discharge Exam: Vitals:   01/28/18 0658 01/28/18 0830  BP: 114/70   Pulse: 68   Resp: 20   Temp: (!) 97.4 F (36.3 C)   SpO2: 98% 93%   Vitals:   01/27/18 2130 01/28/18 0128 01/28/18 0658 01/28/18 0830  BP: 115/82  114/70   Pulse: 78  68   Resp: 20  20   Temp: 98 F (36.7 C)  (!) 97.4 F (36.3 C)   TempSrc: Oral  Oral   SpO2: 94% 93% 98% 93%  Weight:      Height:        General: Pt is alert, awake, not in acute distress Cardiovascular: RRR, S1/S2 +, no rubs, no gallops Respiratory: CTA bilaterally, no wheezing, no rhonchi Abdominal: Soft, NT, ND, bowel sounds + Extremities: no edema, no cyanosis    The results of significant diagnostics from this hospitalization (including imaging, microbiology, ancillary and laboratory) are listed below for reference.     Microbiology: Recent Results (from the past 240 hour(s))  Blood culture (routine x 2)     Status: None (Preliminary result)   Collection Time: 01/24/18  7:31 PM  Result Value Ref Range Status   Specimen Description   Final    BLOOD LEFT ANTECUBITAL Performed at [place] [facility] [address] [place], [place] [address]    Special Requests   Final    BOTTLES DRAWN AEROBIC AND ANAEROBIC Blood Culture adequate  volume Performed at [facility] [address] [place], [place] [address]    Culture   Final    NO GROWTH 4 DAYS Performed at [facility], [place] [address], [place], [place] [address]    Report Status PENDING  Incomplete  Blood culture (routine x 2)     Status: None (Preliminary result)   Collection Time: 01/24/18  8:02 PM  Result Value Ref Range Status   Specimen Description   Final    BLOOD RIGHT HAND Performed at [facility] [address], [place], [place] [address]    Special Requests   Final    AEROBIC BOTTLE ONLY Blood Culture adequate volume Performed at [facility] [address], [place], [place] [address]    Culture   Final    NO GROWTH 4 DAYS Performed at [facility], [place] [address] [place], [place] [address]  Report Status PENDING  Incomplete  MRSA PCR Screening     Status: None   Collection Time: 01/25/18  8:50 AM  Result Value Ref Range Status   MRSA by PCR NEGATIVE NEGATIVE Final    Comment:        The GeneXpert MRSA Assay (FDA approved for NASAL specimens only), is one component of a comprehensive MRSA colonization surveillance program. It is not intended to diagnose MRSA infection nor to guide or monitor treatment for MRSA infections. Performed at [facility], [place] [address] [place], [place] [address]   Culture, Urine     Status: None   Collection Time: 01/25/18  9:14 AM  Result Value Ref Range Status   Specimen Description   Final    URINE, CLEAN CATCH Performed at [facility], [place] [address] [place], [place] [address]    Special Requests   Final    NONE Performed at [facility], [place] [address] [place], [place] [address]    Culture   Final    NO GROWTH Performed at [facility], [place] [address], [place], [place] [address]    Report Status 01/26/2018 FINAL  Final     Labs: BNP (last 3  results) Recent Labs    12/24/17 1546 01/24/18 2002 01/25/18 0529  BNP 246.2* 139.7* 761.6*   Basic Metabolic Panel: Recent Labs  Lab 01/24/18 2047 01/25/18 0529 01/25/18 1022 01/26/18 0259 01/27/18 0754 01/28/18 0340  NA 141 138  --  141 142 138  K 4.0 5.4* 4.7 4.5 4.5 4.3  CL 100* 107  --  104 102 100*  CO2 25 22  --  25 29 25   GLUCOSE 133* 205*  --  262* 271* 347*  BUN 14 15  --  26* 30* 31*  CREATININE 1.19 0.98  --  1.36* 1.21 1.14  CALCIUM 9.3 7.9*  --  8.7* 8.8* 8.0*  MG  --   --   --  2.0  --   --    Liver Function Tests: Recent Labs  Lab 01/24/18 2047  AST 25  ALT 28  ALKPHOS 67  BILITOT 0.6  PROT 8.3*  ALBUMIN 3.3*   No results for input(s): LIPASE, AMYLASE in the last 168 hours. No results for input(s): AMMONIA in the last 168 hours. CBC: Recent Labs  Lab 01/24/18 2002 01/25/18 0529 01/26/18 0259  WBC 8.7 5.2 11.4*  NEUTROABS  --   --  10.0*  HGB 15.3 12.4* 14.1  HCT 47.2 38.9* 43.3  MCV 92.0 92.6 92.1  PLT 195 139* 185   Cardiac Enzymes: Recent Labs  Lab 01/24/18 2047 01/25/18 1022 01/25/18 1537 01/25/18 2149  TROPONINI <0.03 <0.03 <0.03 <0.03   BNP: Invalid input(s): POCBNP CBG: Recent Labs  Lab 01/27/18 0801 01/27/18 1105 01/27/18 1656 01/27/18 2126 01/28/18 0737  GLUCAP 399* 398* 310* 359* 304*   D-Dimer No results for input(s): DDIMER in the last 72 hours. Hgb A1c Recent Labs    01/27/18 0756  HGBA1C 9.2*   Lipid Profile No results for input(s): CHOL, HDL, LDLCALC, TRIG, CHOLHDL, LDLDIRECT in the last 72 hours. Thyroid function studies No results for input(s): TSH, T4TOTAL, T3FREE, THYROIDAB in the last 72 hours.  Invalid input(s): FREET3 Anemia work up No results for input(s): VITAMINB12, FOLATE, FERRITIN, TIBC, IRON, RETICCTPCT in the last 72 hours. Urinalysis    Component Value Date/Time   COLORURINE YELLOW 01/25/2018 0914   APPEARANCEUR CLEAR 01/25/2018 0914  LABSPEC 1.019 01/25/2018 0914   PHURINE  6.0 01/25/2018 0914   GLUCOSEU NEGATIVE 01/25/2018 0914   GLUCOSEU NEGATIVE 05/25/2017 1158   HGBUR NEGATIVE 01/25/2018 0914   BILIRUBINUR NEGATIVE 01/25/2018 0914   KETONESUR NEGATIVE 01/25/2018 0914   PROTEINUR 100 (A) 01/25/2018 0914   UROBILINOGEN 0.2 05/25/2017 1158   NITRITE NEGATIVE 01/25/2018 0914   LEUKOCYTESUR NEGATIVE 01/25/2018 0914   Sepsis Labs Invalid input(s): PROCALCITONIN,  WBC,  LACTICIDVEN Microbiology Recent Results (from the past 240 hour(s))  Blood culture (routine x 2)     Status: None (Preliminary result)   Collection Time: 01/24/18  7:31 PM  Result Value Ref Range Status   Specimen Description   Final    BLOOD LEFT ANTECUBITAL Performed at [facility], [place] [address] [place], [place] [address]    Special Requests   Final    BOTTLES DRAWN AEROBIC AND ANAEROBIC Blood Culture adequate volume Performed at [place] [address] [place], [place] [address]    Culture   Final    NO GROWTH 4 DAYS Performed at [facility], [place] [address], [place], [place] [address]    Report Status PENDING  Incomplete  Blood culture (routine x 2)     Status: None (Preliminary result)   Collection Time: 01/24/18  8:02 PM  Result Value Ref Range Status   Specimen Description   Final    BLOOD RIGHT HAND Performed at [facility] [address] [place], [place] [address]    Special Requests   Final    AEROBIC BOTTLE ONLY Blood Culture adequate volume Performed at [facility] [address] [place], [place] [address]    Culture   Final    NO GROWTH 4 DAYS Performed at [facility], [place] [address] [place], [place] [address]    Report Status PENDING  Incomplete  MRSA PCR Screening     Status: None   Collection Time: 01/25/18  8:50 AM  Result Value Ref Range Status   MRSA by PCR NEGATIVE NEGATIVE Final    Comment:        The GeneXpert MRSA Assay (FDA approved for NASAL  specimens only), is one component of a comprehensive MRSA colonization surveillance program. It is not intended to diagnose MRSA infection nor to guide or monitor treatment for MRSA infections. Performed at [facility], [place] [address], [place], [place] [address]   Culture, Urine     Status: None   Collection Time: 01/25/18  9:14 AM  Result Value Ref Range Status   Specimen Description   Final    URINE, CLEAN CATCH Performed at [facility], [place] [address] [place], [place] [address]    Special Requests   Final    NONE Performed at [facility], [place] [address] [place], [place] [address]    Culture   Final    NO GROWTH Performed at [facility], [place] [address] [place], [place] [address]    Report Status 01/26/2018 FINAL  Final     Time coordinating discharge: 40 minutes  SIGNED:   Charlynne Cousins, MD  [facility] 01/28/2018, 11:20 AM Pager   If 7PM-7AM, please contact night-coverage [email or web address] Password [email or web address]

## 2018-01-28 NOTE — Telephone Encounter (Signed)
Received phone call request for pleurX order forpt from Marney Doctor, [facility] Case Manager as pt has already been discharged. Sent fax to [phone]. Confirmed fax receipt 01/28/18 at 1513.

## 2018-01-28 NOTE — Progress Notes (Signed)
Inpatient Diabetes Program Recommendations  AACE/ADA: New Consensus Statement on Inpatient Glycemic Control (2015)  Target Ranges:  Prepandial:   less than 140 mg/dL      Peak postprandial:   less than 180 mg/dL (1-2 hours)      Critically ill patients:  140 - 180 mg/dL   Lab Results  Component Value Date   GLUCAP 304 (H) 01/28/2018   HGBA1C 9.2 (H) 01/27/2018    Review of Glycemic Control  Diabetes history: DM 2 Outpatient Diabetes medications: Lantus 30 units QHS, metformin 1000 mg bid, Victoza 1.8 mg QAM Current orders for Inpatient glycemic control: Lantus 20 units BID, Novolog Moderate Correction 0-15 units tid + Novolog HS scale 0-5 units, Novolog 4 units tid meal coverage  Inpatient Diabetes Program Recommendations:    Glucose in lower 300's this am. Consider increasing Lantus to 28 units BID.  Thanks,  Tama Headings RN, MSN, BC-ADM, [facility] Inpatient Diabetes Coordinator Team Pager [phone] (8a-5p)

## 2018-01-28 NOTE — Progress Notes (Addendum)
Order form for Pleurx Drainage Kits completed by Dr. Julien Nordmann and faxed to [place] [phone]) by this CM. Successful fax receipt received.  Pt will be contacted by [facility] for delivery of future Pleurex Drainage kits. Marney Doctor RN,BSN,NCM [phone]

## 2018-01-29 LAB — CULTURE, BLOOD (ROUTINE X 2)
CULTURE: NO GROWTH
Culture: NO GROWTH
Special Requests: ADEQUATE
Special Requests: ADEQUATE

## 2018-01-30 DIAGNOSIS — C3491 Malignant neoplasm of unspecified part of right bronchus or lung: Secondary | ICD-10-CM | POA: Diagnosis not present

## 2018-01-30 DIAGNOSIS — E1165 Type 2 diabetes mellitus with hyperglycemia: Secondary | ICD-10-CM | POA: Diagnosis not present

## 2018-01-30 DIAGNOSIS — Z96642 Presence of left artificial hip joint: Secondary | ICD-10-CM | POA: Diagnosis not present

## 2018-01-30 DIAGNOSIS — F17210 Nicotine dependence, cigarettes, uncomplicated: Secondary | ICD-10-CM | POA: Diagnosis not present

## 2018-01-30 DIAGNOSIS — I5033 Acute on chronic diastolic (congestive) heart failure: Secondary | ICD-10-CM | POA: Diagnosis not present

## 2018-01-30 DIAGNOSIS — Z4682 Encounter for fitting and adjustment of non-vascular catheter: Secondary | ICD-10-CM | POA: Diagnosis not present

## 2018-01-30 DIAGNOSIS — Z794 Long term (current) use of insulin: Secondary | ICD-10-CM | POA: Diagnosis not present

## 2018-01-30 DIAGNOSIS — E669 Obesity, unspecified: Secondary | ICD-10-CM | POA: Diagnosis not present

## 2018-01-30 DIAGNOSIS — Z7901 Long term (current) use of anticoagulants: Secondary | ICD-10-CM | POA: Diagnosis not present

## 2018-01-30 DIAGNOSIS — I2510 Atherosclerotic heart disease of native coronary artery without angina pectoris: Secondary | ICD-10-CM | POA: Diagnosis not present

## 2018-01-30 DIAGNOSIS — J440 Chronic obstructive pulmonary disease with acute lower respiratory infection: Secondary | ICD-10-CM | POA: Diagnosis not present

## 2018-01-30 DIAGNOSIS — I110 Hypertensive heart disease with heart failure: Secondary | ICD-10-CM | POA: Diagnosis not present

## 2018-01-30 DIAGNOSIS — J441 Chronic obstructive pulmonary disease with (acute) exacerbation: Secondary | ICD-10-CM | POA: Diagnosis not present

## 2018-01-30 DIAGNOSIS — E1151 Type 2 diabetes mellitus with diabetic peripheral angiopathy without gangrene: Secondary | ICD-10-CM | POA: Diagnosis not present

## 2018-01-30 DIAGNOSIS — G4733 Obstructive sleep apnea (adult) (pediatric): Secondary | ICD-10-CM | POA: Diagnosis not present

## 2018-01-30 DIAGNOSIS — K219 Gastro-esophageal reflux disease without esophagitis: Secondary | ICD-10-CM | POA: Diagnosis not present

## 2018-01-30 DIAGNOSIS — J189 Pneumonia, unspecified organism: Secondary | ICD-10-CM | POA: Diagnosis not present

## 2018-01-30 DIAGNOSIS — J910 Malignant pleural effusion: Secondary | ICD-10-CM | POA: Diagnosis not present

## 2018-01-30 DIAGNOSIS — I4891 Unspecified atrial fibrillation: Secondary | ICD-10-CM | POA: Diagnosis not present

## 2018-01-30 DIAGNOSIS — E785 Hyperlipidemia, unspecified: Secondary | ICD-10-CM | POA: Diagnosis not present

## 2018-01-31 ENCOUNTER — Encounter (HOSPITAL_COMMUNITY): Payer: Self-pay | Admitting: Internal Medicine

## 2018-01-31 ENCOUNTER — Telehealth: Payer: Self-pay | Admitting: Medical Oncology

## 2018-01-31 ENCOUNTER — Telehealth: Payer: Self-pay | Admitting: Internal Medicine

## 2018-01-31 DIAGNOSIS — J440 Chronic obstructive pulmonary disease with acute lower respiratory infection: Secondary | ICD-10-CM | POA: Diagnosis not present

## 2018-01-31 DIAGNOSIS — C3491 Malignant neoplasm of unspecified part of right bronchus or lung: Secondary | ICD-10-CM | POA: Diagnosis not present

## 2018-01-31 DIAGNOSIS — E1151 Type 2 diabetes mellitus with diabetic peripheral angiopathy without gangrene: Secondary | ICD-10-CM | POA: Diagnosis not present

## 2018-01-31 DIAGNOSIS — J189 Pneumonia, unspecified organism: Secondary | ICD-10-CM | POA: Diagnosis not present

## 2018-01-31 DIAGNOSIS — J441 Chronic obstructive pulmonary disease with (acute) exacerbation: Secondary | ICD-10-CM | POA: Diagnosis not present

## 2018-01-31 DIAGNOSIS — J910 Malignant pleural effusion: Secondary | ICD-10-CM | POA: Diagnosis not present

## 2018-01-31 NOTE — Telephone Encounter (Signed)
Asking about f/u appt.  And tx plan. Schedule request sent.

## 2018-01-31 NOTE — Telephone Encounter (Addendum)
PT said pt walked 60 ft today on  Oxygen 4 liters . His highest oxygen sat got up to 82 % on 4 liters after resting for 35 minutes. She is not accomplishing anything with pt.  I told her to stop visits .Nursing scheduled to see pt tomorrow and twice a week.  I called pt and he said his breathing is better since he "had a nap".

## 2018-01-31 NOTE — Telephone Encounter (Signed)
schedule request sent.

## 2018-01-31 NOTE — Telephone Encounter (Signed)
Scheduled appt per 5/13 sch message - left message with appt date and time.

## 2018-02-01 ENCOUNTER — Telehealth: Payer: Self-pay | Admitting: Medical Oncology

## 2018-02-01 DIAGNOSIS — J441 Chronic obstructive pulmonary disease with (acute) exacerbation: Secondary | ICD-10-CM | POA: Diagnosis not present

## 2018-02-01 DIAGNOSIS — J189 Pneumonia, unspecified organism: Secondary | ICD-10-CM | POA: Diagnosis not present

## 2018-02-01 DIAGNOSIS — E1151 Type 2 diabetes mellitus with diabetic peripheral angiopathy without gangrene: Secondary | ICD-10-CM | POA: Diagnosis not present

## 2018-02-01 DIAGNOSIS — J910 Malignant pleural effusion: Secondary | ICD-10-CM | POA: Diagnosis not present

## 2018-02-01 DIAGNOSIS — C3491 Malignant neoplasm of unspecified part of right bronchus or lung: Secondary | ICD-10-CM | POA: Diagnosis not present

## 2018-02-01 DIAGNOSIS — J440 Chronic obstructive pulmonary disease with acute lower respiratory infection: Secondary | ICD-10-CM | POA: Diagnosis not present

## 2018-02-01 NOTE — Telephone Encounter (Signed)
Asking for orders for pleurix drainage -how often. Pt drained today of 550 ml.  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 Per Julien Nordmann  I gave order to drain Pleurix catheter weekly . His oxygen sat today after drainage was 97%.

## 2018-02-02 DIAGNOSIS — J910 Malignant pleural effusion: Secondary | ICD-10-CM | POA: Diagnosis not present

## 2018-02-02 DIAGNOSIS — C3491 Malignant neoplasm of unspecified part of right bronchus or lung: Secondary | ICD-10-CM | POA: Diagnosis not present

## 2018-02-02 DIAGNOSIS — J189 Pneumonia, unspecified organism: Secondary | ICD-10-CM | POA: Diagnosis not present

## 2018-02-02 DIAGNOSIS — J440 Chronic obstructive pulmonary disease with acute lower respiratory infection: Secondary | ICD-10-CM | POA: Diagnosis not present

## 2018-02-02 DIAGNOSIS — J441 Chronic obstructive pulmonary disease with (acute) exacerbation: Secondary | ICD-10-CM | POA: Diagnosis not present

## 2018-02-02 DIAGNOSIS — E1151 Type 2 diabetes mellitus with diabetic peripheral angiopathy without gangrene: Secondary | ICD-10-CM | POA: Diagnosis not present

## 2018-02-04 ENCOUNTER — Other Ambulatory Visit: Payer: Self-pay | Admitting: *Deleted

## 2018-02-04 DIAGNOSIS — C3491 Malignant neoplasm of unspecified part of right bronchus or lung: Secondary | ICD-10-CM | POA: Diagnosis not present

## 2018-02-04 DIAGNOSIS — J189 Pneumonia, unspecified organism: Secondary | ICD-10-CM | POA: Diagnosis not present

## 2018-02-04 DIAGNOSIS — J440 Chronic obstructive pulmonary disease with acute lower respiratory infection: Secondary | ICD-10-CM | POA: Diagnosis not present

## 2018-02-04 DIAGNOSIS — J441 Chronic obstructive pulmonary disease with (acute) exacerbation: Secondary | ICD-10-CM | POA: Diagnosis not present

## 2018-02-04 DIAGNOSIS — J910 Malignant pleural effusion: Secondary | ICD-10-CM | POA: Diagnosis not present

## 2018-02-04 DIAGNOSIS — E1151 Type 2 diabetes mellitus with diabetic peripheral angiopathy without gangrene: Secondary | ICD-10-CM | POA: Diagnosis not present

## 2018-02-04 NOTE — Progress Notes (Unsigned)
Pt daughter called with following concerns: 1. Why did a Education officer, museum not come to house? 2. Pt does not have a case manager, they did not assign him one upon discharge. 3. [facility] is coming to the house 2x week to drain Pluerix. What is taking so long to start his treatment? 4. His legs are swollen, he cant walk. 5. Fall risk because he coughs so hard   he blacks out and then falls hits his head  (this has happened in the past). 6. Pt needs assistance with ADL's and bathing. Daughter requested [facility] to come out and evaluate pt. Pt lives alone and requested not to go to SNF upon discharge from hospital. 7. Daughter advised pt is unable to respond to questions due to lack of oxygen and forgetfulness.  Total of 35 minutes spent with Daghter discussing concerns.  Referral to social work made per her request.  Call to [facility] regarding pt status and fluid drained.  [facility] RN Ubaldo Glassing advised daughter expressed same similar concerns.   Pt is still smoking while wearing oxygen, not complying with diet and BS monitoring.  Ubaldo Glassing will put in referral for Education officer, museum.  Message to [facility] social worker to s/w pt/family at next office visit on 5/21.

## 2018-02-07 ENCOUNTER — Other Ambulatory Visit: Payer: Self-pay | Admitting: Medical Oncology

## 2018-02-07 DIAGNOSIS — C3491 Malignant neoplasm of unspecified part of right bronchus or lung: Secondary | ICD-10-CM

## 2018-02-08 ENCOUNTER — Inpatient Hospital Stay (HOSPITAL_BASED_OUTPATIENT_CLINIC_OR_DEPARTMENT_OTHER): Payer: Medicare Other | Admitting: Internal Medicine

## 2018-02-08 ENCOUNTER — Encounter: Payer: Self-pay | Admitting: Internal Medicine

## 2018-02-08 ENCOUNTER — Inpatient Hospital Stay: Payer: Medicare Other

## 2018-02-08 ENCOUNTER — Telehealth: Payer: Self-pay | Admitting: Internal Medicine

## 2018-02-08 ENCOUNTER — Telehealth: Payer: Self-pay | Admitting: Medical Oncology

## 2018-02-08 ENCOUNTER — Other Ambulatory Visit: Payer: Self-pay | Admitting: Internal Medicine

## 2018-02-08 VITALS — BP 94/55 | HR 98 | Temp 98.2°F | Resp 18 | Ht 71.0 in | Wt 268.9 lb

## 2018-02-08 DIAGNOSIS — Z86010 Personal history of colonic polyps: Secondary | ICD-10-CM | POA: Diagnosis not present

## 2018-02-08 DIAGNOSIS — E785 Hyperlipidemia, unspecified: Secondary | ICD-10-CM | POA: Diagnosis not present

## 2018-02-08 DIAGNOSIS — Z5111 Encounter for antineoplastic chemotherapy: Secondary | ICD-10-CM

## 2018-02-08 DIAGNOSIS — E119 Type 2 diabetes mellitus without complications: Secondary | ICD-10-CM

## 2018-02-08 DIAGNOSIS — I10 Essential (primary) hypertension: Secondary | ICD-10-CM

## 2018-02-08 DIAGNOSIS — Z7189 Other specified counseling: Secondary | ICD-10-CM | POA: Insufficient documentation

## 2018-02-08 DIAGNOSIS — C782 Secondary malignant neoplasm of pleura: Secondary | ICD-10-CM

## 2018-02-08 DIAGNOSIS — J440 Chronic obstructive pulmonary disease with acute lower respiratory infection: Secondary | ICD-10-CM | POA: Diagnosis not present

## 2018-02-08 DIAGNOSIS — Z79899 Other long term (current) drug therapy: Secondary | ICD-10-CM

## 2018-02-08 DIAGNOSIS — C3411 Malignant neoplasm of upper lobe, right bronchus or lung: Secondary | ICD-10-CM

## 2018-02-08 DIAGNOSIS — R5382 Chronic fatigue, unspecified: Secondary | ICD-10-CM

## 2018-02-08 DIAGNOSIS — J441 Chronic obstructive pulmonary disease with (acute) exacerbation: Secondary | ICD-10-CM | POA: Diagnosis not present

## 2018-02-08 DIAGNOSIS — J910 Malignant pleural effusion: Secondary | ICD-10-CM | POA: Diagnosis not present

## 2018-02-08 DIAGNOSIS — J189 Pneumonia, unspecified organism: Secondary | ICD-10-CM | POA: Diagnosis not present

## 2018-02-08 DIAGNOSIS — F17210 Nicotine dependence, cigarettes, uncomplicated: Secondary | ICD-10-CM | POA: Diagnosis not present

## 2018-02-08 DIAGNOSIS — K219 Gastro-esophageal reflux disease without esophagitis: Secondary | ICD-10-CM | POA: Diagnosis not present

## 2018-02-08 DIAGNOSIS — Z5112 Encounter for antineoplastic immunotherapy: Secondary | ICD-10-CM | POA: Insufficient documentation

## 2018-02-08 DIAGNOSIS — I351 Nonrheumatic aortic (valve) insufficiency: Secondary | ICD-10-CM

## 2018-02-08 DIAGNOSIS — J449 Chronic obstructive pulmonary disease, unspecified: Secondary | ICD-10-CM | POA: Diagnosis not present

## 2018-02-08 DIAGNOSIS — I739 Peripheral vascular disease, unspecified: Secondary | ICD-10-CM | POA: Diagnosis not present

## 2018-02-08 DIAGNOSIS — C3491 Malignant neoplasm of unspecified part of right bronchus or lung: Secondary | ICD-10-CM

## 2018-02-08 DIAGNOSIS — E1151 Type 2 diabetes mellitus with diabetic peripheral angiopathy without gangrene: Secondary | ICD-10-CM | POA: Diagnosis not present

## 2018-02-08 LAB — CBC WITH DIFFERENTIAL (CANCER CENTER ONLY)
Basophils Absolute: 0 10*3/uL (ref 0.0–0.1)
Basophils Relative: 0 %
EOS ABS: 0.1 10*3/uL (ref 0.0–0.5)
Eosinophils Relative: 1 %
HEMATOCRIT: 43.6 % (ref 38.4–49.9)
Hemoglobin: 14.1 g/dL (ref 13.0–17.1)
LYMPHS ABS: 1.5 10*3/uL (ref 0.9–3.3)
LYMPHS PCT: 13 %
MCH: 29.4 pg (ref 27.2–33.4)
MCHC: 32.3 g/dL (ref 32.0–36.0)
MCV: 91 fL (ref 79.3–98.0)
Monocytes Absolute: 1.3 10*3/uL — ABNORMAL HIGH (ref 0.1–0.9)
Monocytes Relative: 12 %
NEUTROS ABS: 8.1 10*3/uL — AB (ref 1.5–6.5)
NEUTROS PCT: 74 %
Platelet Count: 171 10*3/uL (ref 140–400)
RBC: 4.79 MIL/uL (ref 4.20–5.82)
RDW: 14.7 % — AB (ref 11.0–14.6)
WBC: 11.1 10*3/uL — AB (ref 4.0–10.3)

## 2018-02-08 LAB — CMP (CANCER CENTER ONLY)
ALT: 19 U/L (ref 0–55)
ANION GAP: 12 — AB (ref 3–11)
AST: 14 U/L (ref 5–34)
Albumin: 2.8 g/dL — ABNORMAL LOW (ref 3.5–5.0)
Alkaline Phosphatase: 67 U/L (ref 40–150)
BUN: 16 mg/dL (ref 7–26)
CO2: 29 mmol/L (ref 22–29)
Calcium: 9.1 mg/dL (ref 8.4–10.4)
Chloride: 98 mmol/L (ref 98–109)
Creatinine: 1.38 mg/dL — ABNORMAL HIGH (ref 0.70–1.30)
GFR, EST AFRICAN AMERICAN: 60 mL/min — AB (ref >60)
GFR, EST NON AFRICAN AMERICAN: 51 mL/min — AB (ref >60)
Glucose, Bld: 151 mg/dL — ABNORMAL HIGH (ref 70–140)
POTASSIUM: 3.7 mmol/L (ref 3.5–5.1)
SODIUM: 139 mmol/L (ref 136–145)
Total Bilirubin: 0.4 mg/dL (ref 0.2–1.2)
Total Protein: 7 g/dL (ref 6.4–8.3)

## 2018-02-08 MED ORDER — FOLIC ACID 1 MG PO TABS: 1.0000 mg | ORAL_TABLET | Freq: Every day | ORAL | 4 refills | Status: AC

## 2018-02-08 MED ORDER — LIDOCAINE-PRILOCAINE 2.5-2.5 % EX CREA: 1.0000 "application " | TOPICAL_CREAM | CUTANEOUS | 0 refills | Status: AC | PRN

## 2018-02-08 MED ORDER — CYANOCOBALAMIN 1000 MCG/ML IJ SOLN
1000.0000 ug | Freq: Once | INTRAMUSCULAR | Status: AC
  Administered 2018-02-08: 1000 ug via INTRAMUSCULAR

## 2018-02-08 MED ORDER — PROCHLORPERAZINE MALEATE 10 MG PO TABS: 10.0000 mg | ORAL_TABLET | Freq: Four times a day (QID) | ORAL | 0 refills | Status: DC | PRN

## 2018-02-08 MED ORDER — CYANOCOBALAMIN 1000 MCG/ML IJ SOLN
INTRAMUSCULAR | Status: AC
  Filled 2018-02-08: qty 1

## 2018-02-08 NOTE — Progress Notes (Signed)
[place] Telephone:([phone]) [phone]   Fax:([phone]) [phone]  OFFICE PROGRESS NOTE  Hoyt Koch, MD [place] [address]  DIAGNOSIS: Recurrent and metastatic non-small cell lung cancer, adenocarcinoma initially diagnosed as stage IB (T2a, N0, M0) non-small cell lung cancer, squamous cell carcinoma presented with right upper lobe pleural-based mass diagnosed in June 2018.  He has disease recurrence and April 2019 consistent with adenocarcinoma in the right pleural fluid.  Biomarker Findings Microsatellite status - MS-Stable Tumor Mutational Burden - TMB-Intermediate (14 Muts/Mb) Genomic Findings For a complete list of the genes assayed, please refer to the Appendix. RAD51D truncation intron 6 CDKN2A/B loss MTAP loss exons 2-8 NBN K260f*16 TP53 R158L   PDL1 Expression 0%.  PRIOR THERAPY: Definitive and curative SBRT to the right upper lobe lung nodule under the care of Dr. MTammi Klippelcompleted on 04/05/2017.  CURRENT THERAPY: Systemic chemotherapy with carboplatin for AUC of 5, Alimta 500 mg/M2 and Keytruda 200 mg IV every 3 weeks.  First dose Feb 17, 2018.  INTERVAL HISTORY: Neil Carroll. 67y.o. male returns to the clinic today for follow-up visit accompanied by his daughter.  The patient is feeling fine today except for the baseline shortness of breath.  [facility] and he has a Pleurx catheter placed for drainage of the recurrent right pleural effusion.  He is feeling much better.  He continues to have the swelling in the lower extremities.  He denied having any chest pain, cough or hemoptysis.  He denied having any fever or chills.  He has no nausea, vomiting, diarrhea or constipation.  He has no recent weight loss or night sweats.  He has drainage of the Pleurx catheter earlier today.  The patient had molecular studies performed on the tissue block and unfortunately it  showed no actionable mutations.  PDL 1 expression was also negative.  He is here today for evaluation and discussion of his treatment options based on the recent molecular studies.  MEDICAL HISTORY: Past Medical History:  Diagnosis Date  . Adenomatous colon polyp   . Aortic insufficiency    Echo 3/18: Severe concentric LVH, EF 60-65, normal wall motion, moderate AI, mild LAE, mild TR // Echo 9/18: Mild concentric LVH, EF 60-65, normal wall motion, trivial AI, MAC, mild BAE  . Arthritis    left hip replacement  . Atrial flutter ([place]    onset  2011. s/p EPS/RFA 12/2011  . Cataract   . Chronic bronchitis   . Contact dermatitis and other eczema due to plants (except food)   . COPD [place] obstructive pulmonary disease) [place]   . GERD (gastroesophageal reflux disease)   . Hyperlipidemia   . Hypertension   . Lung cancer [place]   . LV dysfunction    EF 45-50% 12/2011  . OSA (obstructive sleep apnea) 10/13/2016   Mild with AHI 9.7/hr with significant oxygen desaturations as low as 76% now on CPAP  . Other peripheral vascular disease(443.89)    bilateral lower extremity  . Tobacco use disorder    dependent  . Transient global amnesia   . Tuberculosis   . Type II diabetes mellitus (HCC)     ALLERGIES:  is allergic to niacin.  MEDICATIONS:  Current Outpatient Medications  Medication Sig Dispense Refill  . CRESTOR 10 MG tablet Take 1 tablet (10 mg total) by mouth daily. 90 tablet 3  . diltiazem (CARTIA XT) 180  MG 24 hr capsule Take 1 capsule (180 mg total) by mouth 2 (two) times daily. 180 capsule 3  . doxycycline (VIBRAMYCIN) 50 MG capsule Take 1 capsule (50 mg total) by mouth 2 (two) times daily. 6 capsule 0  . etanercept (ENBREL SURECLICK) 50 MG/ML injection Inject 50 mg into the skin as directed. Inject on Sundays & Wednesdays     . Flaxseed, Linseed, (FLAX SEED OIL) 1000 MG CAPS Take 1 capsule by mouth 2 (two) times daily.     . Fluticasone-Umeclidin-Vilant (TRELEGY ELLIPTA)  100-62.5-25 MCG/INH AEPB Inhale 1 puff into the lungs daily. 30 each 11  . furosemide (LASIX) 80 MG tablet Take 1 tablet (80 mg total) by mouth 2 (two) times daily. 180 tablet 2  . guaiFENesin (MUCINEX) 600 MG 12 hr tablet Take 1 tablet (600 mg total) by mouth 2 (two) times daily. (Patient not taking: Reported on 01/11/2018) 20 tablet 0  . Insulin Glargine (LANTUS SOLOSTAR) 100 UNIT/ML Solostar Pen Inject 30 Units into the skin daily at 10 pm. 5 pen 1  . ipratropium-albuterol (DUONEB) 0.5-2.5 (3) MG/3ML SOLN Take 3 mLs by nebulization every 4 (four) hours as needed. 360 mL 2  . liraglutide (VICTOZA) 18 MG/3ML SOPN INJECT 0.3ML (=1.8MG)      SUBCUTANEOUSLY DAILY 27 mL 1  . losartan (COZAAR) 25 MG tablet TAKE 1 TABLET BY MOUTH ONCE DAILY 90 tablet 2  . metFORMIN (GLUCOPHAGE) 1000 MG tablet TAKE 1 TABLET BY MOUTH TWICE DAILY WITH FOOD 180 tablet 0  . metoprolol tartrate (LOPRESSOR) 100 MG tablet take1 tablet by mouth every morning and take 1 and 1/2 tablets by mouth every evening 225 tablet 3  . Multiple Vitamins-Minerals (CENTRUM ADULTS) TABS Take 1 tablet by mouth daily.    . mupirocin ointment (BACTROBAN) 2 % Apply 1 application topically 2 (two) times daily as needed (skin).     . nicotine (NICODERM CQ - DOSED IN MG/24 HOURS) 21 mg/24hr patch Place 1 patch (21 mg total) onto the skin daily. 28 patch 0  . Nintedanib (OFEV) 150 MG CAPS Take 1 capsule (150 mg total) by mouth 2 (two) times daily. 60 capsule 11  . NOVOFINE 32G X 6 MM MISC use twice a day 100 each 5  . Omega-3 Fatty Acids (FISH OIL) 1000 MG CAPS Take 1 capsule by mouth 2 (two) times daily.     Glory Rosebush DELICA LANCETS FINE MISC TEST BLOOD SUGAR DAILY 200 each 12  . potassium chloride SA (K-DUR,KLOR-CON) 20 MEQ tablet take 1 tablet by mouth twice a day 180 tablet 3  . senna-docusate (SENOKOT-S) 8.6-50 MG tablet Take 1 tablet by mouth at bedtime as needed for mild constipation. (Patient not taking: Reported on 01/11/2018) 20 tablet 0  .  tamsulosin (FLOMAX) 0.4 MG CAPS capsule Take 0.4 mg by mouth daily.     [name] triamcinolone cream (KENALOG) 0.1 % Apply 1 application topically 2 (two) times daily as needed (affected area/skin).     Alveda Reasons 20 MG TABS tablet TAKE 1 TABLET BY MOUTH ONCE DAILY 30 tablet 10   No current facility-administered medications for this visit.     SURGICAL HISTORY:  Past Surgical History:  Procedure Laterality Date  . A FLUTTER ABLATION N/A 12/28/2011   Procedure: ABLATION A FLUTTER;  Surgeon: Evans Lance, MD;  Location: [facility] CATH LAB;  Service: Cardiovascular;  Laterality: N/A;  . CARDIAC CATHETERIZATION N/A 07/10/2015   Procedure: Left Heart Cath and Coronary Angiography;  Surgeon: Sherren Mocha, MD;  Location: [place] CV LAB;  Service: Cardiovascular;  Laterality: N/A;  . CARDIAC ELECTROPHYSIOLOGY MAPPING AND ABLATION  03/2010  . CARDIOVERSION N/A 07/13/2016   Procedure: CARDIOVERSION;  Surgeon: Skeet Latch, MD;  Location: [place];  Service: Cardiovascular;  Laterality: N/A;  . COLONOSCOPY    . colonoscopy with polypectomy  2006 & 2011   Dr [name]  . CYSTOSCOPY  11/2007   Dr Jeffie Pollock  . IR GUIDED DRAIN W CATHETER PLACEMENT  01/26/2018  . IR US GUIDE BX ASP/DRAIN  01/26/2018  . PARTIAL HIP ARTHROPLASTY Right 01/2000   "Right; replaced ball & stem"  . PARTIAL HIP ARTHROPLASTY Left   . POLYPECTOMY    . TEE WITHOUT CARDIOVERSION  12/28/2011   Procedure: TRANSESOPHAGEAL ECHOCARDIOGRAM (TEE);  Surgeon: Larey Dresser, MD;  Location: [place];  Service: Cardiovascular;  Laterality: N/A;    REVIEW OF SYSTEMS:  Constitutional: positive for fatigue Eyes: negative Ears, nose, mouth, throat, and face: negative Respiratory: positive for dyspnea on exertion Cardiovascular: negative Gastrointestinal: negative Genitourinary:negative Integument/breast: negative Hematologic/lymphatic: negative Musculoskeletal:negative Neurological: negative Behavioral/Psych: negative Endocrine:  negative Allergic/Immunologic: negative   PHYSICAL EXAMINATION: General appearance: alert, cooperative, fatigued and no distress Head: Normocephalic, without obvious abnormality, atraumatic Neck: no adenopathy, no JVD, supple, symmetrical, trachea midline and thyroid not enlarged, symmetric, no tenderness/mass/nodules Lymph nodes: Cervical, supraclavicular, and axillary nodes normal. Resp: diminished breath sounds RLL, dullness to percussion RLL and wheezes RUL Back: symmetric, no curvature. ROM normal. No CVA tenderness. Cardio: regular rate and rhythm, S1, S2 normal, no murmur, click, rub or gallop GI: soft, non-tender; bowel sounds normal; no masses,  no organomegaly Extremities: edema 2+ edema Neurologic: Alert and oriented X 3, normal strength and tone. Normal symmetric reflexes. Normal coordination and gait  ECOG PERFORMANCE STATUS: 1 - Symptomatic but completely ambulatory  Blood pressure (!) 94/55, pulse 98, temperature 98.2 F (36.8 C), temperature source Oral, resp. rate 18, height 5' 11"  (1.803 m), weight 268 lb 14.4 oz (122 kg), SpO2 95 %.  LABORATORY DATA: Lab Results  Component Value Date   WBC 11.1 (H) 02/08/2018   HGB 14.1 02/08/2018   HCT 43.6 02/08/2018   MCV 91.0 02/08/2018   PLT 171 02/08/2018      Chemistry      Component Value Date/Time   NA 138 01/28/2018 0340   NA 141 10/21/2017 1203   NA 140 07/07/2017 1015   K 4.3 01/28/2018 0340   K 4.1 07/07/2017 1015   CL 100 (L) 01/28/2018 0340   CO2 25 01/28/2018 0340   CO2 26 07/07/2017 1015   BUN 31 (H) 01/28/2018 0340   BUN 17 10/21/2017 1203   BUN 18.8 07/07/2017 1015   CREATININE 1.14 01/28/2018 0340   CREATININE 1.5 (H) 07/07/2017 1015   GLU 149 07/07/2013 1325      Component Value Date/Time   CALCIUM 8.0 (L) 01/28/2018 0340   CALCIUM 9.5 07/07/2017 1015   ALKPHOS 67 01/24/2018 2047   ALKPHOS 55 07/07/2017 1015   AST 25 01/24/2018 2047   AST 15 07/07/2017 1015   ALT 28 01/24/2018 2047   ALT  15 07/07/2017 1015   BILITOT 0.6 01/24/2018 2047   BILITOT 0.51 07/07/2017 1015       RADIOGRAPHIC STUDIES: Dg Chest 2 View  Result Date: 01/24/2018 CLINICAL DATA:  Shortness of breath.  Lung cancer.  COPD. EXAM: CHEST - 2 VIEW COMPARISON:  01/05/2018. FINDINGS: Marked worsening of BILATERAL pulmonary opacities, particularly on the RIGHT, with consolidation, atelectasis, and effusion. Asymmetric  interstitial like opacity at the LEFT base is increased, without effusion or significant volume loss. Cardiomegaly. Thoracic atherosclerosis. IMPRESSION: Marked worsening aeration, with significant increase in RIGHT lung opacity, likely combination of atelectasis, infiltrate, and effusion, likely worsening metastatic disease. Electronically Signed   By: Staci Righter M.D.   On: 01/24/2018 18:44   Ir Guided Niel Hummer W Catheter Placement  Result Date: 01/26/2018 INDICATION: 67 year old male with metastatic squamous cell carcinoma and a recurrent symptomatic malignant right pleural effusion. EXAM: Tunneled right pleural drainage catheter placement COMPARISON:  None. MEDICATIONS: The patient is currently admitted to the hospital and receiving intravenous antibiotics. The antibiotics were administered within an appropriate time frame prior to the initiation of the procedure. ANESTHESIA/SEDATION: Fentanyl 50 mcg IV; Versed 1 mg IV Moderate Sedation Time:  20 minutes The patient was continuously monitored during the procedure by the interventional radiology nurse under my direct supervision. COMPLICATIONS: None immediate. TECHNIQUE: Informed written consent was obtained from the patient after a thorough discussion of the procedural risks, benefits and alternatives. All questions were addressed. Maximal Sterile Barrier Technique was utilized including caps, mask, sterile gowns, sterile gloves, sterile drape, hand hygiene and skin antiseptic. A timeout was performed prior to the initiation of the procedure. The right chest  was interrogated with ultrasound. The largest fluid pocket was identified. Local anesthesia was attained by infiltration with 1% lidocaine. A small dermatotomy was made. Under real-time sonographic guidance, the fluid pocket was accessed with an 18 gauge sheath needle. The sheath was left in the pleural space and the needle was removed. A short Amplatz wire was advanced into the pleural space. A skin exit site was selected 5 cm superior and medial to the pleural entry site. Local anesthesia was again attained by infiltration with 1% lidocaine. A small dermatotomy was made. The tunneled PleurX drainage catheter was then tunneled from the skin exit site to the dermatotomy overlying the pleural access site. The peel-away sheath was then advanced over the wire and into the pleural space. The dilator and wire were removed. The PleurX catheter was advanced through the peel-away sheath and the peel-away sheath was discarded. The PleurX catheter was connected to vacuum tender with evacuation of 1700 mL pleural fluid. The drainage catheter was secured to the skin with 0 Prolene suture. The dermatotomy overlying the pleural access site was closed with a single inverted interrupted 3-0 Vicryl suture in the epidermis was sealed with Dermabond. The patient tolerated the procedure well. PROCEDURE: As above. IMPRESSION: Successful placement of a right-sided tunneled pleural PleurX drainage catheter. Drainage at the time of placement yields 1.7 L pleural fluid. Signed, Criselda Peaches, MD [facility] Electronically Signed   By: Jacqulynn Cadet M.D.   On: 01/26/2018 17:16   Nm Pet Image Restag (ps) Skull Base To Thigh  Result Date: 01/14/2018 CLINICAL DATA:  Subsequent treatment strategy for non-small-cell lung cancer. Malignant right pleural effusion on thoracentesis 12/28/2017. EXAM: NUCLEAR MEDICINE PET SKULL BASE TO THIGH TECHNIQUE: 13.69 mCi F-18 FDG was  injected intravenously. Full-ring PET imaging was performed from the skull base to thigh after the radiotracer. CT data was obtained and used for attenuation correction and anatomic localization. Fasting blood glucose: 149 mg/dl COMPARISON:  PET-CT 02/09/2017.  Chest CT 12/24/2017. FINDINGS: Mediastinal blood pool activity: SUV max 2.5 NECK: No hypermetabolic cervical lymph nodes are identified.There are no lesions of the pharyngeal mucosal space. Incidental CT findings: Mild carotid atherosclerosis bilaterally. CHEST: There are no hypermetabolic mediastinal, hilar or axillary  lymph nodes. There is multifocal hypermetabolic activity within the right pleural space, especially posteriorly where there is a focal area of hypermetabolic activity extending 8.2 cm cephalocaudad and having an SUV max of 8.9. There is no abnormal metabolic activity within the left pleural space. There is no significant hypermetabolic activity within the irradiated right upper lobe lesion. However, there is a small focus of right upper lobe parenchymal hypermetabolic activity (SUV max 4.4) more anteriorly. Incidental CT findings: The right pleural effusion has decreased in volume compared with the recent chest CT. Probable radiation changes in the right lung superimposed on underlying pulmonary fibrosis and emphysema are grossly stable. ABDOMEN/PELVIS: There is no hypermetabolic activity within the liver, adrenal glands, spleen or pancreas. There is no hypermetabolic nodal activity. Incidental CT findings: Borderline hepatic steatosis. Aortic and branch vessel atherosclerosis, small hiatal hernia and mild enlargement of the prostate gland. SKELETON: There is no hypermetabolic activity to suggest osseous metastatic disease. Incidental CT findings: Previous bilateral total hip arthroplasty. IMPRESSION: 1. Hypermetabolic pleural activity in the right hemithorax, especially posteriorly, corresponding with known malignant pleural effusion. The  pleural effusion has decreased in volume compared with the diagnostic CT of 3 weeks ago. 2. The irradiated right upper lobe lesion demonstrates no significant residual focal hypermetabolic activity. 3. No distant metastases identified. Electronically Signed   By: Richardean Sale M.D.   On: 01/14/2018 09:26   Ir US Guide Bx Asp/drain  Result Date: 01/26/2018 INDICATION: 67 year old male with metastatic squamous cell carcinoma and a recurrent symptomatic malignant right pleural effusion. EXAM: Tunneled right pleural drainage catheter placement COMPARISON:  None. MEDICATIONS: The patient is currently admitted to the hospital and receiving intravenous antibiotics. The antibiotics were administered within an appropriate time frame prior to the initiation of the procedure. ANESTHESIA/SEDATION: Fentanyl 50 mcg IV; Versed 1 mg IV Moderate Sedation Time:  20 minutes The patient was continuously monitored during the procedure by the interventional radiology nurse under my direct supervision. COMPLICATIONS: None immediate. TECHNIQUE: Informed written consent was obtained from the patient after a thorough discussion of the procedural risks, benefits and alternatives. All questions were addressed. Maximal Sterile Barrier Technique was utilized including caps, mask, sterile gowns, sterile gloves, sterile drape, hand hygiene and skin antiseptic. A timeout was performed prior to the initiation of the procedure. The right chest was interrogated with ultrasound. The largest fluid pocket was identified. Local anesthesia was attained by infiltration with 1% lidocaine. A small dermatotomy was made. Under real-time sonographic guidance, the fluid pocket was accessed with an 18 gauge sheath needle. The sheath was left in the pleural space and the needle was removed. A short Amplatz wire was advanced into the pleural space. A skin exit site was selected 5 cm superior and medial to the pleural entry site. Local anesthesia was again  attained by infiltration with 1% lidocaine. A small dermatotomy was made. The tunneled PleurX drainage catheter was then tunneled from the skin exit site to the dermatotomy overlying the pleural access site. The peel-away sheath was then advanced over the wire and into the pleural space. The dilator and wire were removed. The PleurX catheter was advanced through the peel-away sheath and the peel-away sheath was discarded. The PleurX catheter was connected to vacuum tender with evacuation of 1700 mL pleural fluid. The drainage catheter was secured to the skin with 0 Prolene suture. The dermatotomy overlying the pleural access site was closed with a single inverted interrupted 3-0 Vicryl suture in the epidermis was sealed with Dermabond. The patient tolerated  the procedure well. PROCEDURE: As above. IMPRESSION: Successful placement of a right-sided tunneled pleural PleurX drainage catheter. Drainage at the time of placement yields 1.7 L pleural fluid. Signed, Criselda Peaches, MD [facility]iology Electronically Signed   By: Jacqulynn Cadet M.D.   On: 01/26/2018 17:16    ASSESSMENT AND PLAN: this is a very pleasant 67 years old white male with recurrent and metastatic non-small cell lung cancer, adenocarcinoma that was initially diagnosed as stage IB (T2a, N0, M0) non-small cell lung cancer, squamous cell carcinoma status post SBRT to the right upper lobe lung mass by Dr. Tammi Klippel. The patient had recurrent right pleural effusion and ultrasound-guided thoracentesis showed malignant cells consistent with adenocarcinoma. The patient had molecular studies performed by foundation 1 that showed no actionable mutations.  PDL 1 expression was negative. I had a lengthy discussion with the patient and his daughter today about his current condition and treatment options.  I explained to the patient that he has incurable condition and all the treatment will be of  palliative nature.  The patient was given the option of palliative care and hospice referral versus consideration of palliative systemic chemotherapy with carboplatin for AC of 5, Alimta 500 mg/M2 and Keytruda 200 mg IV every 3 weeks.  I discussed with the patient the adverse effect of this treatment including but not limited to mild alopecia, nausea and vomiting, myelosuppression, liver or renal dysfunction in addition to the adverse effect of immunotherapy. The patient is interested in treatment.  He will receive vitamin B12 injection today.  I will send prescription for folic acid 1 mg p.o. daily in addition to Compazine 10 mg p.o. every 6 hours as needed for nausea and Emla Cream to his pharmacy. He is expected to start the first cycle of this treatment next week. I will arrange for the patient to have a chemotherapy education class before the first cycle of his treatment. I will refer the patient to interventional radiology for Port-A-Cath placement. For the recurrent pleural effusion, the patient will continue with the Pleurx catheter drainage 2-3 times a week by advanced home care. He will come back for follow-up visit in 2 weeks for reevaluation and management of any adverse effect of his treatment. He was advised to call immediately if he has any concerning symptoms in the interval. I provided his daughter with the contact information of the social worker at the cancer center for assistant with his home health and transportation. The patient voices understanding of current disease status and treatment options and is in agreement with the current care plan.  All questions were answered. The patient knows to call the clinic with any problems, questions or concerns. We can certainly see the patient much sooner if necessary.  Disclaimer: This note was dictated with voice recognition software. Similar sounding words can inadvertently be transcribed and may not be corrected upon review.

## 2018-02-08 NOTE — Telephone Encounter (Signed)
Pleurxi drainage-850 cc today with increase of oxygen sat 88% to 91%. Previous drain was 7 days ago of  550 ml. He is still smoking . Social worker from [facility] will see pt.  He is seeing [place] today . Alexis told pt to tell Julien Nordmann what he needs. Please call Alexis with any change in care.

## 2018-02-08 NOTE — Telephone Encounter (Signed)
Scheduled appt per 5/21 los - unable to schedule first treatment due to cap- logged - will call pt when appts schedule.

## 2018-02-08 NOTE — Progress Notes (Signed)
START ON PATHWAY REGIMEN - Non-Small Cell Lung     A cycle is every 21 days:     Pembrolizumab      Pemetrexed      Carboplatin   **Always confirm dose/schedule in your pharmacy ordering system**    Patient Characteristics: Stage IV Metastatic, Nonsquamous, Initial Chemotherapy/Immunotherapy, PS = 0, 1, PD-L1 Expression Positive 1-49% (TPS) / Negative / Not Tested / Awaiting Test Results AJCC T Category: T2a Current Disease Status: Distant Metastases AJCC N Category: N0 AJCC M Category: M1a AJCC 8 Stage Grouping: IVA Histology: Nonsquamous Cell ROS1 Rearrangement Status: Negative T790M Mutation Status: Not Applicable - EGFR Mutation Negative/Unknown Other Mutations/Biomarkers: No Other Actionable Mutations NTRK Gene Fusion Status: Negative PD-L1 Expression Status: PD-L1 Negative Chemotherapy/Immunotherapy LOT: Initial Chemotherapy/Immunotherapy Molecular Targeted Therapy: Not Appropriate ALK Translocation Status: Negative EGFR Mutation Status: Negative/Wild Type BRAF V600E Mutation Status: Negative Performance Status: PS = 0, 1 Intent of Therapy: Non-Curative / Palliative Intent, Discussed with Patient

## 2018-02-09 ENCOUNTER — Telehealth: Payer: Self-pay | Admitting: Internal Medicine

## 2018-02-09 ENCOUNTER — Telehealth: Payer: Self-pay

## 2018-02-09 NOTE — Telephone Encounter (Signed)
Pt is aware of appt added for treatments per 5/21 los - pt to get an updated schedule at 5/28 chemo class

## 2018-02-09 NOTE — Telephone Encounter (Signed)
Phone call placed to patient to introduce palliative care and to offer to schedule visit with NP. Visit scheduled for 02/10/18.

## 2018-02-10 ENCOUNTER — Telehealth: Payer: Self-pay | Admitting: *Deleted

## 2018-02-10 ENCOUNTER — Other Ambulatory Visit: Payer: Medicare Other | Admitting: Internal Medicine

## 2018-02-10 DIAGNOSIS — Z7901 Long term (current) use of anticoagulants: Secondary | ICD-10-CM

## 2018-02-10 DIAGNOSIS — F17210 Nicotine dependence, cigarettes, uncomplicated: Secondary | ICD-10-CM

## 2018-02-10 DIAGNOSIS — K219 Gastro-esophageal reflux disease without esophagitis: Secondary | ICD-10-CM

## 2018-02-10 DIAGNOSIS — J441 Chronic obstructive pulmonary disease with (acute) exacerbation: Secondary | ICD-10-CM | POA: Diagnosis not present

## 2018-02-10 DIAGNOSIS — I5033 Acute on chronic diastolic (congestive) heart failure: Secondary | ICD-10-CM | POA: Diagnosis not present

## 2018-02-10 DIAGNOSIS — Z4682 Encounter for fitting and adjustment of non-vascular catheter: Secondary | ICD-10-CM | POA: Diagnosis not present

## 2018-02-10 DIAGNOSIS — R531 Weakness: Secondary | ICD-10-CM

## 2018-02-10 DIAGNOSIS — I110 Hypertensive heart disease with heart failure: Secondary | ICD-10-CM | POA: Diagnosis not present

## 2018-02-10 DIAGNOSIS — G4733 Obstructive sleep apnea (adult) (pediatric): Secondary | ICD-10-CM

## 2018-02-10 DIAGNOSIS — J189 Pneumonia, unspecified organism: Secondary | ICD-10-CM | POA: Diagnosis not present

## 2018-02-10 DIAGNOSIS — E785 Hyperlipidemia, unspecified: Secondary | ICD-10-CM

## 2018-02-10 DIAGNOSIS — Z96642 Presence of left artificial hip joint: Secondary | ICD-10-CM

## 2018-02-10 DIAGNOSIS — C3491 Malignant neoplasm of unspecified part of right bronchus or lung: Secondary | ICD-10-CM | POA: Diagnosis not present

## 2018-02-10 DIAGNOSIS — I4891 Unspecified atrial fibrillation: Secondary | ICD-10-CM | POA: Diagnosis not present

## 2018-02-10 DIAGNOSIS — J910 Malignant pleural effusion: Secondary | ICD-10-CM | POA: Diagnosis not present

## 2018-02-10 DIAGNOSIS — R0600 Dyspnea, unspecified: Secondary | ICD-10-CM | POA: Diagnosis not present

## 2018-02-10 DIAGNOSIS — E1151 Type 2 diabetes mellitus with diabetic peripheral angiopathy without gangrene: Secondary | ICD-10-CM | POA: Diagnosis not present

## 2018-02-10 DIAGNOSIS — I2510 Atherosclerotic heart disease of native coronary artery without angina pectoris: Secondary | ICD-10-CM | POA: Diagnosis not present

## 2018-02-10 DIAGNOSIS — J440 Chronic obstructive pulmonary disease with acute lower respiratory infection: Secondary | ICD-10-CM | POA: Diagnosis not present

## 2018-02-10 DIAGNOSIS — E1165 Type 2 diabetes mellitus with hyperglycemia: Secondary | ICD-10-CM | POA: Diagnosis not present

## 2018-02-10 DIAGNOSIS — E669 Obesity, unspecified: Secondary | ICD-10-CM

## 2018-02-10 DIAGNOSIS — Z7189 Other specified counseling: Secondary | ICD-10-CM

## 2018-02-10 DIAGNOSIS — Z794 Long term (current) use of insulin: Secondary | ICD-10-CM

## 2018-02-10 NOTE — Telephone Encounter (Signed)
Laquan from [facility] called requesting extension of home nursing visits  for pt. Laquan's     Phone     [phone].

## 2018-02-11 ENCOUNTER — Encounter: Payer: Self-pay | Admitting: *Deleted

## 2018-02-11 ENCOUNTER — Encounter: Payer: Self-pay | Admitting: Internal Medicine

## 2018-02-11 ENCOUNTER — Other Ambulatory Visit: Payer: Self-pay | Admitting: Endocrinology

## 2018-02-11 DIAGNOSIS — J910 Malignant pleural effusion: Secondary | ICD-10-CM | POA: Diagnosis not present

## 2018-02-11 DIAGNOSIS — J441 Chronic obstructive pulmonary disease with (acute) exacerbation: Secondary | ICD-10-CM | POA: Diagnosis not present

## 2018-02-11 DIAGNOSIS — Z006 Encounter for examination for normal comparison and control in clinical research program: Secondary | ICD-10-CM

## 2018-02-11 DIAGNOSIS — C3491 Malignant neoplasm of unspecified part of right bronchus or lung: Secondary | ICD-10-CM | POA: Diagnosis not present

## 2018-02-11 DIAGNOSIS — E1151 Type 2 diabetes mellitus with diabetic peripheral angiopathy without gangrene: Secondary | ICD-10-CM | POA: Diagnosis not present

## 2018-02-11 DIAGNOSIS — J440 Chronic obstructive pulmonary disease with acute lower respiratory infection: Secondary | ICD-10-CM | POA: Diagnosis not present

## 2018-02-11 DIAGNOSIS — J189 Pneumonia, unspecified organism: Secondary | ICD-10-CM | POA: Diagnosis not present

## 2018-02-11 NOTE — Progress Notes (Signed)
[place] Work  Clinical Social Work was referred by Psychologist, forensic for information regarding home care services.  Clinical Social Worker contacted caregiver by phone  to offer support and assess for needs.  Patient's daughter, Donella Stade, requested assistance identifying home care resources covered by patient's insurance.  Patient has Medicare, [employer], and [employer]- CSW explained to her understanding, these insurances do not cover daily living needs provided by a CNA.  CSW informed patient's daughter she would have to contact the insurance provider directly if she would like to confirm this accuracy.  Patient's daughter expressed frustrations regarding the care coordination process and communication among the healthcare team- CSW actively listened to daughter's concerns.  CSW agreed to follow up with patient directly next week during infusion treatment to evaluate patient's perspective of needs.     Kennith Center, LCSW  Clinical Social Worker [facility]

## 2018-02-11 NOTE — Progress Notes (Signed)
02/10/2018 PALLIATIVE CARE CONSULT Neil Lobo C . Sr. DOB: 1950/11/12, [place], [place] [id number]. Lemmie [phone], [phone] REFERRING PROVIDER: Dr. Curt Bears (O) [phone], (Fax) [phone]. PCP Dr. Pricilla Holm ([phone], (Fax) [phone], [facility]/SW; [place] assists with bathing 2 X/wk FAMILY: (sister) Neil Carroll, (daughter) Neil Carroll [phone] lives close by  BILLABLE  ICD-10: IMPRESSION/RECOMMENDATIONS: 1. Neil Carroll r/t lung cancer: Marked dyspnea after ambulating approximately 20 feet. He adjusts his oxygen up/down accordingly. He is not short of breath with conversation. He has a right posterior axillary Pleurx catheter (placed for recurrent pleural effusion) that is being drained by [facility] on a weekly basis; anticipating increase of frequency to twice  a week. Need assistance with bathing (3 x's a week with [facility] aide), and even assist markedly dyspneic. He continues to smoke, but is aware of danger of fire/facial burns. He knows to both remove his nasal cannula, and turn off compressor. Occasional coughing spasms; non-productive.  Uses furniture/walls to steady himself when walking about the home. Agreeable for walker to use on his outside trips. Good appetite; eats 100% 3 meals/d. Wt 268, BMRI 37.5kg/m2.  a. Ask Dr. Peggye Ley office if they would please order walker to be delivered from [facility].  2. Care Management: -Patient having difficulty arranging for transportation to and from next week's  appointments at the cancer center (classes/Port-A-Cath placement/chemo administration ). We resourced the [place] [facility] program [phone]), and he secured a rides via their program for two of his appointments; his daughter is able to provide transportation to the other. -Daughter entered tail end of our visit; expressed wish for  hospital bed for her dad. She mentioned she left request with Dr. Peggye Ley office. -Patient aligned with prognosis as shared by Dr. Julien Nordmann (incurable condition/palliative nature of anticipated chemo regimen). I discussed options down the road including Hospice; we spent a bit of time talking about eligibility criteria, and services offered.   3. Advanced Care Planning: - DNR/MOST forms reviewed, discussed; educational/explanatory information also left. At this time he would wish full resuscitative efforts including CPR/intubation/ICU care, as needed.   I spent  75 minutes providing this consultation (from 2pm to 3:15pm). More than 50% of that time was spent coordinating communication.  HPI: 68 yo male, with right lung cancer (pleural effusions/pleurex drain), COPD, HTN, heart disease with heart failure. Recent office visit with Dr. Mckinley Jewel, who discussed patient's underlying incurable condition, and that all of the treatment will be of palliative nature. Options included palliative chemo vs  hospice referral. Patient opted for chemo.  Now referred to Palliative Care for assistance with symptom management, help with coordination of resources, and ongoing discussions regarding goals of care/advanced directives.  CODE STATUS:  full code PPS: 50%.  HOSPICE ELIGIBILITY:  yes, pending stopping aggressive therapies  MEDICATIONS :  MTV, diltiazem 180mg  bid, doxycycline 50mg  bid, fish oil bid, flaxseed oil bid, furosemide 80 mg bid, ipratropium-albuterol (Duo NEB) neb qid (prn; usually prn), Lantus Solostar 30 u's qhs usullly qd, losartan 25mg  qd, metformin 1000mg  qd, nicotine patch 21mg  qd, potassium chloride 20MEQ bid, rosuvastatin 10mg  qd, tamsulosin 0.4mg  qd, liraglutide (Victoza) 1.8mg  sq qd qhs, xarelto 20 mg qd, etanercept (Enbrel sureclick) 50mg /ml sq twice a week, Trelegy Ellpta 1 puff qd, metoprolol tartrate 100mg  1tab qam and 1  tab qhs, senna-docusate 8.6-50mg  qhs prn, 161 day before yesterday.  Anticipating initiation of IV chemo  q3wks, to start nxt week. 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   PHYSICAL EXAM: Obese, pleasantly conversant, easily fatigued VS: Stats 97% 6L, HR 102, BP 100/58 HEENT: [place], AT LUNGS:  clear right fields; diminished RUL, Inspiratory crackles right base CARDIAC:  RRR without MRG ABD: Protrudent, obese. NABS EXTREMITIES: marked LE swelling; elastic wrappings in place SKIN: intact. Right anterior axillary line Pleurex catheter covered w/gauze; no drainage. NEURO: A & O X 3  Neil Gelinas NP-C

## 2018-02-11 NOTE — Telephone Encounter (Signed)
Reviewed with MD regarding Pleurix. Per MD, [facility] to drain Pleurix 2-3x times week as needed. Notified [facility] s/w a Freight forwarder gave verbal order Deanna Artis, RN with MD verbal order. Ubaldo Glassing advised she will go by and see pt today. No further concerns.

## 2018-02-14 ENCOUNTER — Other Ambulatory Visit: Payer: Self-pay | Admitting: Cardiovascular Disease

## 2018-02-14 DIAGNOSIS — E1151 Type 2 diabetes mellitus with diabetic peripheral angiopathy without gangrene: Secondary | ICD-10-CM | POA: Diagnosis not present

## 2018-02-14 DIAGNOSIS — C3491 Malignant neoplasm of unspecified part of right bronchus or lung: Secondary | ICD-10-CM | POA: Diagnosis not present

## 2018-02-14 DIAGNOSIS — J910 Malignant pleural effusion: Secondary | ICD-10-CM | POA: Diagnosis not present

## 2018-02-14 DIAGNOSIS — J440 Chronic obstructive pulmonary disease with acute lower respiratory infection: Secondary | ICD-10-CM | POA: Diagnosis not present

## 2018-02-14 DIAGNOSIS — J441 Chronic obstructive pulmonary disease with (acute) exacerbation: Secondary | ICD-10-CM | POA: Diagnosis not present

## 2018-02-14 DIAGNOSIS — J189 Pneumonia, unspecified organism: Secondary | ICD-10-CM | POA: Diagnosis not present

## 2018-02-15 ENCOUNTER — Inpatient Hospital Stay: Payer: Medicare Other

## 2018-02-15 ENCOUNTER — Telehealth: Payer: Self-pay | Admitting: Medical Oncology

## 2018-02-15 ENCOUNTER — Telehealth: Payer: Self-pay

## 2018-02-15 ENCOUNTER — Inpatient Hospital Stay: Payer: Medicare Other | Admitting: *Deleted

## 2018-02-15 DIAGNOSIS — J440 Chronic obstructive pulmonary disease with acute lower respiratory infection: Secondary | ICD-10-CM | POA: Diagnosis not present

## 2018-02-15 DIAGNOSIS — J441 Chronic obstructive pulmonary disease with (acute) exacerbation: Secondary | ICD-10-CM | POA: Diagnosis not present

## 2018-02-15 DIAGNOSIS — E1151 Type 2 diabetes mellitus with diabetic peripheral angiopathy without gangrene: Secondary | ICD-10-CM | POA: Diagnosis not present

## 2018-02-15 DIAGNOSIS — C3491 Malignant neoplasm of unspecified part of right bronchus or lung: Secondary | ICD-10-CM

## 2018-02-15 DIAGNOSIS — J189 Pneumonia, unspecified organism: Secondary | ICD-10-CM | POA: Diagnosis not present

## 2018-02-15 DIAGNOSIS — J910 Malignant pleural effusion: Secondary | ICD-10-CM | POA: Diagnosis not present

## 2018-02-15 NOTE — Telephone Encounter (Signed)
-----   Message from Sherren Mocha, MD sent at 02/15/2018  6:17 AM EDT ----- Regarding: RE: Xarelto Sorry for the delay in getting back to you. This is fine for him to hold Xarelto. thx Ronalee Belts ----- Message ----- From: Anell Barr Sent: 02/09/2018  11:27 AM To: Sherren Mocha, MD Subject: Constance Goltz Morning,  We have a mutual patient that is scheduled for 5/31 for a [facility] Placement and will need to hold his Xarelto for a full 24 hrs prior to procedure.  Are you ok with this?  Thanks, Jonelle Sidle [facility] IR Scheduler [phone]

## 2018-02-15 NOTE — Progress Notes (Signed)
See Consent note encounter dated 02/11/18 for details of this visit.  Foye Spurling, BSN, RN Clinical Research Nurse 02/15/2018 10:53 AM

## 2018-02-15 NOTE — Telephone Encounter (Signed)
Drained yesterday of 500 cc ( 800 cc 4 days prior ) He continues to smoke with oxygen and increased coughing. Pt instructed on dangers of smoke and oxygen but is non compliant. Pt wants to be drained m-w-f. BP 94/70-asking if he should get IVF tomorrow while he is here at cancer center. Nurse will drain him on Thursdays and prn.

## 2018-02-16 DIAGNOSIS — C3491 Malignant neoplasm of unspecified part of right bronchus or lung: Secondary | ICD-10-CM | POA: Diagnosis not present

## 2018-02-16 DIAGNOSIS — J189 Pneumonia, unspecified organism: Secondary | ICD-10-CM | POA: Diagnosis not present

## 2018-02-16 DIAGNOSIS — J440 Chronic obstructive pulmonary disease with acute lower respiratory infection: Secondary | ICD-10-CM | POA: Diagnosis not present

## 2018-02-16 DIAGNOSIS — E1151 Type 2 diabetes mellitus with diabetic peripheral angiopathy without gangrene: Secondary | ICD-10-CM | POA: Diagnosis not present

## 2018-02-16 DIAGNOSIS — J910 Malignant pleural effusion: Secondary | ICD-10-CM | POA: Diagnosis not present

## 2018-02-16 DIAGNOSIS — J441 Chronic obstructive pulmonary disease with (acute) exacerbation: Secondary | ICD-10-CM | POA: Diagnosis not present

## 2018-02-17 ENCOUNTER — Other Ambulatory Visit: Payer: Self-pay | Admitting: Medical Oncology

## 2018-02-17 ENCOUNTER — Inpatient Hospital Stay: Payer: Medicare Other

## 2018-02-17 ENCOUNTER — Other Ambulatory Visit: Payer: Self-pay | Admitting: Radiology

## 2018-02-17 VITALS — BP 100/57 | HR 70 | Temp 97.7°F | Resp 18

## 2018-02-17 DIAGNOSIS — C3411 Malignant neoplasm of upper lobe, right bronchus or lung: Secondary | ICD-10-CM

## 2018-02-17 DIAGNOSIS — J189 Pneumonia, unspecified organism: Secondary | ICD-10-CM | POA: Diagnosis not present

## 2018-02-17 DIAGNOSIS — Z86010 Personal history of colonic polyps: Secondary | ICD-10-CM | POA: Diagnosis not present

## 2018-02-17 DIAGNOSIS — J449 Chronic obstructive pulmonary disease, unspecified: Secondary | ICD-10-CM | POA: Diagnosis not present

## 2018-02-17 DIAGNOSIS — E1151 Type 2 diabetes mellitus with diabetic peripheral angiopathy without gangrene: Secondary | ICD-10-CM | POA: Diagnosis not present

## 2018-02-17 DIAGNOSIS — C782 Secondary malignant neoplasm of pleura: Secondary | ICD-10-CM | POA: Diagnosis not present

## 2018-02-17 DIAGNOSIS — Z5111 Encounter for antineoplastic chemotherapy: Secondary | ICD-10-CM | POA: Diagnosis not present

## 2018-02-17 DIAGNOSIS — E876 Hypokalemia: Secondary | ICD-10-CM

## 2018-02-17 DIAGNOSIS — J910 Malignant pleural effusion: Secondary | ICD-10-CM | POA: Diagnosis not present

## 2018-02-17 DIAGNOSIS — R5382 Chronic fatigue, unspecified: Secondary | ICD-10-CM

## 2018-02-17 DIAGNOSIS — J440 Chronic obstructive pulmonary disease with acute lower respiratory infection: Secondary | ICD-10-CM | POA: Diagnosis not present

## 2018-02-17 DIAGNOSIS — J441 Chronic obstructive pulmonary disease with (acute) exacerbation: Secondary | ICD-10-CM | POA: Diagnosis not present

## 2018-02-17 DIAGNOSIS — C3491 Malignant neoplasm of unspecified part of right bronchus or lung: Secondary | ICD-10-CM | POA: Diagnosis not present

## 2018-02-17 DIAGNOSIS — I351 Nonrheumatic aortic (valve) insufficiency: Secondary | ICD-10-CM | POA: Diagnosis not present

## 2018-02-17 DIAGNOSIS — Z006 Encounter for examination for normal comparison and control in clinical research program: Secondary | ICD-10-CM

## 2018-02-17 LAB — CBC WITH DIFFERENTIAL (CANCER CENTER ONLY)
Basophils Absolute: 0 10*3/uL (ref 0.0–0.1)
Basophils Relative: 0 %
EOS PCT: 4 %
Eosinophils Absolute: 0.3 10*3/uL (ref 0.0–0.5)
HCT: 42.2 % (ref 38.4–49.9)
HEMOGLOBIN: 14 g/dL (ref 13.0–17.1)
LYMPHS ABS: 1.7 10*3/uL (ref 0.9–3.3)
LYMPHS PCT: 21 %
MCH: 30.4 pg (ref 27.2–33.4)
MCHC: 33.2 g/dL (ref 32.0–36.0)
MCV: 91.5 fL (ref 79.3–98.0)
MONOS PCT: 13 %
Monocytes Absolute: 1 10*3/uL — ABNORMAL HIGH (ref 0.1–0.9)
Neutro Abs: 4.9 10*3/uL (ref 1.5–6.5)
Neutrophils Relative %: 62 %
PLATELETS: 200 10*3/uL (ref 140–400)
RBC: 4.61 MIL/uL (ref 4.20–5.82)
RDW: 14.3 % (ref 11.0–14.6)
WBC: 8 10*3/uL (ref 4.0–10.3)

## 2018-02-17 LAB — CMP (CANCER CENTER ONLY)
ALBUMIN: 2.8 g/dL — AB (ref 3.5–5.0)
ALT: 14 U/L (ref 0–55)
AST: 14 U/L (ref 5–34)
Alkaline Phosphatase: 66 U/L (ref 40–150)
Anion gap: 16 — ABNORMAL HIGH (ref 3–11)
BUN: 19 mg/dL (ref 7–26)
CHLORIDE: 94 mmol/L — AB (ref 98–109)
CO2: 27 mmol/L (ref 22–29)
Calcium: 9.2 mg/dL (ref 8.4–10.4)
Creatinine: 1.32 mg/dL — ABNORMAL HIGH (ref 0.70–1.30)
GFR, EST NON AFRICAN AMERICAN: 54 mL/min — AB (ref >60)
GFR, Est AFR Am: >60 mL/min (ref >60)
Glucose, Bld: 163 mg/dL — ABNORMAL HIGH (ref 70–140)
POTASSIUM: 2.9 mmol/L — AB (ref 3.5–5.1)
SODIUM: 137 mmol/L (ref 136–145)
Total Bilirubin: 0.3 mg/dL (ref 0.2–1.2)
Total Protein: 7 g/dL (ref 6.4–8.3)

## 2018-02-17 LAB — TSH: TSH: 1.274 u[IU]/mL (ref 0.320–4.118)

## 2018-02-17 LAB — RESEARCH LABS

## 2018-02-17 MED ORDER — SODIUM CHLORIDE 0.9 % IV SOLN
573.0000 mg | Freq: Once | INTRAVENOUS | Status: AC
  Administered 2018-02-17: 570 mg via INTRAVENOUS
  Filled 2018-02-17: qty 57

## 2018-02-17 MED ORDER — PALONOSETRON HCL INJECTION 0.25 MG/5ML
INTRAVENOUS | Status: AC
  Filled 2018-02-17: qty 5

## 2018-02-17 MED ORDER — SODIUM CHLORIDE 0.9 % IV SOLN
490.0000 mg/m2 | Freq: Once | INTRAVENOUS | Status: AC
  Administered 2018-02-17: 1200 mg via INTRAVENOUS
  Filled 2018-02-17: qty 40

## 2018-02-17 MED ORDER — SODIUM CHLORIDE 0.9 % IV SOLN
200.0000 mg | Freq: Once | INTRAVENOUS | Status: AC
  Administered 2018-02-17: 200 mg via INTRAVENOUS
  Filled 2018-02-17: qty 8

## 2018-02-17 MED ORDER — POTASSIUM CHLORIDE CRYS ER 20 MEQ PO TBCR: 20.0000 meq | EXTENDED_RELEASE_TABLET | ORAL | 0 refills | Status: AC

## 2018-02-17 MED ORDER — SODIUM CHLORIDE 0.9 % IV SOLN
Freq: Once | INTRAVENOUS | Status: AC
  Administered 2018-02-17: 08:00:00 via INTRAVENOUS

## 2018-02-17 MED ORDER — POTASSIUM CHLORIDE CRYS ER 20 MEQ PO TBCR: 20.0000 meq | EXTENDED_RELEASE_TABLET | Freq: Two times a day (BID) | ORAL | 0 refills | Status: DC

## 2018-02-17 MED ORDER — SODIUM CHLORIDE 0.9 % IV SOLN
Freq: Once | INTRAVENOUS | Status: AC
  Administered 2018-02-17: 10:00:00 via INTRAVENOUS
  Filled 2018-02-17: qty 5

## 2018-02-17 MED ORDER — SODIUM CHLORIDE 0.9 % IV SOLN
Freq: Once | INTRAVENOUS | Status: AC
  Administered 2018-02-17: 10:00:00 via INTRAVENOUS
  Filled 2018-02-17: qty 1000

## 2018-02-17 MED ORDER — PALONOSETRON HCL INJECTION 0.25 MG/5ML
0.2500 mg | Freq: Once | INTRAVENOUS | Status: AC
  Administered 2018-02-17: 0.25 mg via INTRAVENOUS

## 2018-02-17 MED ORDER — SODIUM CHLORIDE 0.9 % IV SOLN
Freq: Once | INTRAVENOUS | Status: DC
  Filled 2018-02-17: qty 1000

## 2018-02-17 NOTE — Progress Notes (Signed)
Pt's potassium is 2.9 today. Per Dr Julien Nordmann pt to receive 40 mEq potassium in 1L fluids today in tx area. Pt is taking his Rx for potassium at home as prescribed.

## 2018-02-17 NOTE — Patient Instructions (Addendum)
[facility] Discharge Instructions for Patients Receiving Chemotherapy  Today you received the following chemotherapy agents Keyrtuda, Alimta, Carboplatin  To help prevent nausea and vomiting after your treatment, we encourage you to take your nausea medication as prescribed.  If you develop nausea and vomiting that is not controlled by your nausea medication, call the clinic.   BELOW ARE SYMPTOMS THAT SHOULD BE REPORTED IMMEDIATELY:  *FEVER GREATER THAN 100.5 F  *CHILLS WITH OR WITHOUT FEVER  NAUSEA AND VOMITING THAT IS NOT CONTROLLED WITH YOUR NAUSEA MEDICATION  *UNUSUAL SHORTNESS OF BREATH  *UNUSUAL BRUISING OR BLEEDING  TENDERNESS IN MOUTH AND THROAT WITH OR WITHOUT PRESENCE OF ULCERS  *URINARY PROBLEMS  *BOWEL PROBLEMS  UNUSUAL RASH Items with * indicate a potential emergency and should be followed up as soon as possible.  Feel free to call the clinic should you have any questions or concerns. The clinic phone number is [phone].  Please show the [facility] at check-in to the Emergency Department and triage nurse.  Pembrolizumab injection Beryle Flock) What is this medicine? PEMBROLIZUMAB (pem broe liz ue mab) is a monoclonal antibody. It is used to treat melanoma, head and neck cancer, Hodgkin lymphoma, non-small cell lung cancer, urothelial cancer, stomach cancer, and cancers that have a certain genetic condition. This medicine may be used for other purposes; ask your health care provider or pharmacist if you have questions. COMMON BRAND NAME(S): Keytruda What should I tell my health care provider before I take this medicine? They need to know if you have any of these conditions: -diabetes -immune system problems -inflammatory bowel disease -liver disease -lung or breathing disease -lupus -organ transplant -an unusual or allergic reaction to pembrolizumab, other medicines, foods, dyes, or preservatives -pregnant or trying to get  pregnant -breast-feeding How should I use this medicine? This medicine is for infusion into a vein. It is given by a health care professional in a hospital or clinic setting. A special MedGuide will be given to you before each treatment. Be sure to read this information carefully each time. Talk to your pediatrician regarding the use of this medicine in children. While this drug may be prescribed for selected conditions, precautions do apply. Overdosage: If you think you have taken too much of this medicine contact a poison control center or emergency room at once. NOTE: This medicine is only for you. Do not share this medicine with others. What if I miss a dose? It is important not to miss your dose. Call your doctor or health care professional if you are unable to keep an appointment. What may interact with this medicine? Interactions have not been studied. Give your health care provider a list of all the medicines, herbs, non-prescription drugs, or dietary supplements you use. Also tell them if you smoke, drink alcohol, or use illegal drugs. Some items may interact with your medicine. This list may not describe all possible interactions. Give your health care provider a list of all the medicines, herbs, non-prescription drugs, or dietary supplements you use. Also tell them if you smoke, drink alcohol, or use illegal drugs. Some items may interact with your medicine. What should I watch for while using this medicine? Your condition will be monitored carefully while you are receiving this medicine. You may need blood work done while you are taking this medicine. Do not become pregnant while taking this medicine or for 4 months after stopping it. Women should inform their doctor if they wish to become pregnant or think  they might be pregnant. There is a potential for serious side effects to an unborn child. Talk to your health care professional or pharmacist for more information. Do not breast-feed  an infant while taking this medicine or for 4 months after the last dose. What side effects may I notice from receiving this medicine? Side effects that you should report to your doctor or health care professional as soon as possible: -allergic reactions like skin rash, itching or hives, swelling of the face, lips, or tongue -bloody or black, tarry -breathing problems -changes in vision -chest pain -chills -constipation -cough -dizziness or feeling faint or lightheaded -fast or irregular heartbeat -fever -flushing -hair loss -low blood counts - this medicine may decrease the number of white blood cells, red blood cells and platelets. You may be at increased risk for infections and bleeding. -muscle pain -muscle weakness -persistent headache -signs and symptoms of high blood sugar such as dizziness; dry mouth; dry skin; fruity breath; nausea; stomach pain; increased hunger or thirst; increased urination -signs and symptoms of kidney injury like trouble passing urine or change in the amount of urine -signs and symptoms of liver injury like dark urine, light-colored stools, loss of appetite, nausea, right upper belly pain, yellowing of the eyes or skin -stomach pain -sweating -weight loss Side effects that usually do not require medical attention (report to your doctor or health care professional if they continue or are bothersome): -decreased appetite -diarrhea -tiredness This list may not describe all possible side effects. Call your doctor for medical advice about side effects. You may report side effects to FDA at 1-800-FDA-1088. Where should I keep my medicine? This drug is given in a hospital or clinic and will not be stored at home. NOTE: This sheet is a summary. It may not cover all possible information. If you have questions about this medicine, talk to your doctor, pharmacist, or health care provider.  2018 Elsevier/Gold Standard (2016-06-16 12:29:36)   Pemetrexed  injection (Alimta) What is this medicine? PEMETREXED (PEM e TREX ed) is a chemotherapy drug used to treat lung cancers like non-small cell lung cancer and mesothelioma. It may also be used to treat other cancers. This medicine may be used for other purposes; ask your health care provider or pharmacist if you have questions. COMMON BRAND NAME(S): Alimta What should I tell my health care provider before I take this medicine? They need to know if you have any of these conditions: -infection (especially a virus infection such as chickenpox, cold sores, or herpes) -kidney disease -low blood counts, like low white cell, platelet, or red cell counts -lung or breathing disease, like asthma -radiation therapy -an unusual or allergic reaction to pemetrexed, other medicines, foods, dyes, or preservative -pregnant or trying to get pregnant -breast-feeding How should I use this medicine? This drug is given as an infusion into a vein. It is administered in a hospital or clinic by a specially trained health care professional. Talk to your pediatrician regarding the use of this medicine in children. Special care may be needed. Overdosage: If you think you have taken too much of this medicine contact a poison control center or emergency room at once. NOTE: This medicine is only for you. Do not share this medicine with others. What if I miss a dose? It is important not to miss your dose. Call your doctor or health care professional if you are unable to keep an appointment. What may interact with this medicine? This medicine may interact with the following  medications: -Ibuprofen This list may not describe all possible interactions. Give your health care provider a list of all the medicines, herbs, non-prescription drugs, or dietary supplements you use. Also tell them if you smoke, drink alcohol, or use illegal drugs. Some items may interact with your medicine. What should I watch for while using this  medicine? Visit your doctor for checks on your progress. This drug may make you feel generally unwell. This is not uncommon, as chemotherapy can affect healthy cells as well as cancer cells. Report any side effects. Continue your course of treatment even though you feel ill unless your doctor tells you to stop. In some cases, you may be given additional medicines to help with side effects. Follow all directions for their use. Call your doctor or health care professional for advice if you get a fever, chills or sore throat, or other symptoms of a cold or flu. Do not treat yourself. This drug decreases your body's ability to fight infections. Try to avoid being around people who are sick. This medicine may increase your risk to bruise or bleed. Call your doctor or health care professional if you notice any unusual bleeding. Be careful brushing and flossing your teeth or using a toothpick because you may get an infection or bleed more easily. If you have any dental work done, tell your dentist you are receiving this medicine. Avoid taking products that contain aspirin, acetaminophen, ibuprofen, naproxen, or ketoprofen unless instructed by your doctor. These medicines may hide a fever. Call your doctor or health care professional if you get diarrhea or mouth sores. Do not treat yourself. To protect your kidneys, drink water or other fluids as directed while you are taking this medicine. Do not become pregnant while taking this medicine or for 6 months after stopping it. Women should inform their doctor if they wish to become pregnant or think they might be pregnant. Men should not father a child while taking this medicine and for 3 months after stopping it. This may interfere with the ability to father a child. You should talk to your doctor or health care professional if you are concerned about your fertility. There is a potential for serious side effects to an unborn child. Talk to your health care  professional or pharmacist for more information. Do not breast-feed an infant while taking this medicine or for 1 week after stopping it. What side effects may I notice from receiving this medicine? Side effects that you should report to your doctor or health care professional as soon as possible: -allergic reactions like skin rash, itching or hives, swelling of the face, lips, or tongue -breathing problems -redness, blistering, peeling or loosening of the skin, including inside the mouth -signs and symptoms of bleeding such as bloody or black, tarry stools; red or dark-brown urine; spitting up blood or brown material that looks like coffee grounds; red spots on the skin; unusual bruising or bleeding from the eye, gums, or nose -signs and symptoms of infection like fever or chills; cough; sore throat; pain or trouble passing urine -signs and symptoms of kidney injury like trouble passing urine or change in the amount of urine -signs and symptoms of liver injury like dark yellow or brown urine; general ill feeling or flu-like symptoms; light-colored stools; loss of appetite; nausea; right upper belly pain; unusually weak or tired; yellowing of the eyes or skin Side effects that usually do not require medical attention (report to your doctor or health care professional if they continue  or are bothersome): -constipation -dizziness -mouth sores -nausea, vomiting -pain, tingling, numbness in the hands or feet -unusually weak or tired This list may not describe all possible side effects. Call your doctor for medical advice about side effects. You may report side effects to FDA at 1-800-FDA-1088. Where should I keep my medicine? This drug is given in a hospital or clinic and will not be stored at home. NOTE: This sheet is a summary. It may not cover all possible information. If you have questions about this medicine, talk to your doctor, pharmacist, or health care provider.  2018 Elsevier/Gold  Standard (2016-07-07 18:51:46)   Carboplatin injection What is this medicine? CARBOPLATIN (KAR boe pla tin) is a chemotherapy drug. It targets fast dividing cells, like cancer cells, and causes these cells to die. This medicine is used to treat ovarian cancer and many other cancers. This medicine may be used for other purposes; ask your health care provider or pharmacist if you have questions. COMMON BRAND NAME(S): Paraplatin What should I tell my health care provider before I take this medicine? They need to know if you have any of these conditions: -blood disorders -hearing problems -kidney disease -recent or ongoing radiation therapy -an unusual or allergic reaction to carboplatin, cisplatin, other chemotherapy, other medicines, foods, dyes, or preservatives -pregnant or trying to get pregnant -breast-feeding How should I use this medicine? This drug is usually given as an infusion into a vein. It is administered in a hospital or clinic by a specially trained health care professional. Talk to your pediatrician regarding the use of this medicine in children. Special care may be needed. Overdosage: If you think you have taken too much of this medicine contact a poison control center or emergency room at once. NOTE: This medicine is only for you. Do not share this medicine with others. What if I miss a dose? It is important not to miss a dose. Call your doctor or health care professional if you are unable to keep an appointment. What may interact with this medicine? -medicines for seizures -medicines to increase blood counts like filgrastim, pegfilgrastim, sargramostim -some antibiotics like amikacin, gentamicin, neomycin, streptomycin, tobramycin -vaccines Talk to your doctor or health care professional before taking any of these medicines: -acetaminophen -aspirin -ibuprofen -ketoprofen -naproxen This list may not describe all possible interactions. Give your health care provider  a list of all the medicines, herbs, non-prescription drugs, or dietary supplements you use. Also tell them if you smoke, drink alcohol, or use illegal drugs. Some items may interact with your medicine. What should I watch for while using this medicine? Your condition will be monitored carefully while you are receiving this medicine. You will need important blood work done while you are taking this medicine. This drug may make you feel generally unwell. This is not uncommon, as chemotherapy can affect healthy cells as well as cancer cells. Report any side effects. Continue your course of treatment even though you feel ill unless your doctor tells you to stop. In some cases, you may be given additional medicines to help with side effects. Follow all directions for their use. Call your doctor or health care professional for advice if you get a fever, chills or sore throat, or other symptoms of a cold or flu. Do not treat yourself. This drug decreases your body's ability to fight infections. Try to avoid being around people who are sick. This medicine may increase your risk to bruise or bleed. Call your doctor or health care professional  if you notice any unusual bleeding. Be careful brushing and flossing your teeth or using a toothpick because you may get an infection or bleed more easily. If you have any dental work done, tell your dentist you are receiving this medicine. Avoid taking products that contain aspirin, acetaminophen, ibuprofen, naproxen, or ketoprofen unless instructed by your doctor. These medicines may hide a fever. Do not become pregnant while taking this medicine. Women should inform their doctor if they wish to become pregnant or think they might be pregnant. There is a potential for serious side effects to an unborn child. Talk to your health care professional or pharmacist for more information. Do not breast-feed an infant while taking this medicine. What side effects may I notice from  receiving this medicine? Side effects that you should report to your doctor or health care professional as soon as possible: -allergic reactions like skin rash, itching or hives, swelling of the face, lips, or tongue -signs of infection - fever or chills, cough, sore throat, pain or difficulty passing urine -signs of decreased platelets or bleeding - bruising, pinpoint red spots on the skin, black, tarry stools, nosebleeds -signs of decreased red blood cells - unusually weak or tired, fainting spells, lightheadedness -breathing problems -changes in hearing -changes in vision -chest pain -high blood pressure -low blood counts - This drug may decrease the number of white blood cells, red blood cells and platelets. You may be at increased risk for infections and bleeding. -nausea and vomiting -pain, swelling, redness or irritation at the injection site -pain, tingling, numbness in the hands or feet -problems with balance, talking, walking -trouble passing urine or change in the amount of urine Side effects that usually do not require medical attention (report to your doctor or health care professional if they continue or are bothersome): -hair loss -loss of appetite -metallic taste in the mouth or changes in taste This list may not describe all possible side effects. Call your doctor for medical advice about side effects. You may report side effects to FDA at 1-800-FDA-1088. Where should I keep my medicine? This drug is given in a hospital or clinic and will not be stored at home. NOTE: This sheet is a summary. It may not cover all possible information. If you have questions about this medicine, talk to your doctor, pharmacist, or health care provider.  2018 Elsevier/Gold Standard (2007-12-13 14:38:05)    Implanted [facility] Guide An implanted port is a type of central line that is placed under the skin. Central lines are used to provide IV access when treatment or nutrition needs to be  given through a person's veins. Implanted ports are used for long-term IV access. An implanted port may be placed because:  You need IV medicine that would be irritating to the small veins in your hands or arms.  You need long-term IV medicines, such as antibiotics.  You need IV nutrition for a long period.  You need frequent blood draws for lab tests.  You need dialysis.  Implanted ports are usually placed in the chest area, but they can also be placed in the upper arm, the abdomen, or the leg. An implanted port has two main parts:  Reservoir. The reservoir is round and will appear as a small, raised area under your skin. The reservoir is the part where a needle is inserted to give medicines or draw blood.  Catheter. The catheter is a thin, flexible tube that extends from the reservoir. The catheter is placed into  a large vein. Medicine that is inserted into the reservoir goes into the catheter and then into the vein.  How will I care for my incision site? Do not get the incision site wet. Bathe or shower as directed by your health care provider. How is my port accessed? Special steps must be taken to access the port:  Before the port is accessed, a numbing cream can be placed on the skin. This helps numb the skin over the port site.  Your health care provider uses a sterile technique to access the port. ? Your health care provider must put on a mask and sterile gloves. ? The skin over your port is cleaned carefully with an antiseptic and allowed to dry. ? The port is gently pinched between sterile gloves, and a needle is inserted into the port.  Only "non-coring" port needles should be used to access the port. Once the port is accessed, a blood return should be checked. This helps ensure that the port is in the vein and is not clogged.  If your port needs to remain accessed for a constant infusion, a clear (transparent) bandage will be placed over the needle site. The bandage and  needle will need to be changed every week, or as directed by your health care provider.  Keep the bandage covering the needle clean and dry. Do not get it wet. Follow your health care provider's instructions on how to take a shower or bath while the port is accessed.  If your port does not need to stay accessed, no bandage is needed over the port.  What is flushing? Flushing helps keep the port from getting clogged. Follow your health care provider's instructions on how and when to flush the port. Ports are usually flushed with saline solution or a medicine called heparin. The need for flushing will depend on how the port is used.  If the port is used for intermittent medicines or blood draws, the port will need to be flushed: ? After medicines have been given. ? After blood has been drawn. ? As part of routine maintenance.  If a constant infusion is running, the port may not need to be flushed.  How long will my port stay implanted? The port can stay in for as long as your health care provider thinks it is needed. When it is time for the port to come out, surgery will be done to remove it. The procedure is similar to the one performed when the port was put in. When should I seek immediate medical care? When you have an implanted port, you should seek immediate medical care if:  You notice a bad smell coming from the incision site.  You have swelling, redness, or drainage at the incision site.  You have more swelling or pain at the port site or the surrounding area.  You have a fever that is not controlled with medicine.  This information is not intended to replace advice given to you by your health care provider. Make sure you discuss any questions you have with your health care provider. Document Released: 09/07/2005 Document Revised: 02/13/2016 Document Reviewed: 05/15/2013 Elsevier Interactive Patient Education  2017 [place].   IMPORTANT!-Pick up your potassium  prescription at your pharmacy today and take as directed.  Hypokalemia (low potassium) Hypokalemia means that the amount of potassium in the blood is lower than normal.Potassium is a chemical that helps regulate the amount of fluid in the body (electrolyte). It also stimulates muscle tightening (contraction)  and helps nerves work properly.Normally, most of the body's potassium is inside of cells, and only a very small amount is in the blood. Because the amount in the blood is so small, minor changes to potassium levels in the blood can be life-threatening. What are the causes? This condition may be caused by:  Antibiotic medicine.  Diarrhea or vomiting. Taking too much of a medicine that helps you have a bowel movement (laxative) can cause diarrhea and lead to hypokalemia.  Chronic kidney disease (CKD).  Medicines that help the body get rid of excess fluid (diuretics).  Eating disorders, such as bulimia.  Low magnesium levels in the body.  Sweating a lot.  What are the signs or symptoms? Symptoms of this condition include:  Weakness.  Constipation.  Fatigue.  Muscle cramps.  Mental confusion.  Skipped heartbeats or irregular heartbeat (palpitations).  Tingling or numbness.  How is this diagnosed? This condition is diagnosed with a blood test. How is this treated? Hypokalemia can be treated by taking potassium supplements by mouth or adjusting the medicines that you take. Treatment may also include eating more foods that contain a lot of potassium. If your potassium level is very low, you may need to get potassium through an IV tube in one of your veins and be monitored in the hospital. Follow these instructions at home:  Take over-the-counter and prescription medicines only as told by your health care provider. This includes vitamins and supplements.  Eat a healthy diet. A healthy diet includes fresh fruits and vegetables, whole grains, healthy fats, and lean  proteins.  If instructed, eat more foods that contain a lot of potassium, such as: ? Nuts, such as peanuts and pistachios. ? Seeds, such as sunflower seeds and pumpkin seeds. ? Peas, lentils, and lima beans. ? Whole grain and bran cereals and breads. ? Fresh fruits and vegetables, such as apricots, avocado, bananas, cantaloupe, kiwi, oranges, tomatoes, asparagus, and potatoes. ? Orange juice. ? Tomato juice. ? Red meats. ? Yogurt.  Keep all follow-up visits as told by your health care provider. This is important. Contact a health care provider if:  You have weakness that gets worse.  You feel your heart pounding or racing.  You vomit.  You have diarrhea.  You have diabetes (diabetes mellitus) and you have trouble keeping your blood sugar (glucose) in your target range. Get help right away if:  You have chest pain.  You have shortness of breath.  You have vomiting or diarrhea that lasts for more than 2 days.  You faint. This information is not intended to replace advice given to you by your health care provider. Make sure you discuss any questions you have with your health care provider. Document Released: 09/07/2005 Document Revised: 04/25/2016 Document Reviewed: 04/25/2016 Elsevier Interactive Patient Education  2018 [employer].

## 2018-02-18 ENCOUNTER — Other Ambulatory Visit: Payer: Self-pay | Admitting: Internal Medicine

## 2018-02-18 ENCOUNTER — Encounter: Payer: Self-pay | Admitting: *Deleted

## 2018-02-18 ENCOUNTER — Encounter (HOSPITAL_COMMUNITY): Payer: Self-pay

## 2018-02-18 ENCOUNTER — Ambulatory Visit (HOSPITAL_COMMUNITY)
Admission: RE | Admit: 2018-02-18 | Discharge: 2018-02-18 | Disposition: A | Discharge status: Home / Self Care | Payer: Medicare Other | Source: Ambulatory Visit | Attending: Internal Medicine | Admitting: Internal Medicine

## 2018-02-18 DIAGNOSIS — G4733 Obstructive sleep apnea (adult) (pediatric): Secondary | ICD-10-CM | POA: Insufficient documentation

## 2018-02-18 DIAGNOSIS — J910 Malignant pleural effusion: Secondary | ICD-10-CM | POA: Diagnosis not present

## 2018-02-18 DIAGNOSIS — I10 Essential (primary) hypertension: Secondary | ICD-10-CM | POA: Diagnosis not present

## 2018-02-18 DIAGNOSIS — J441 Chronic obstructive pulmonary disease with (acute) exacerbation: Secondary | ICD-10-CM | POA: Diagnosis not present

## 2018-02-18 DIAGNOSIS — Z5111 Encounter for antineoplastic chemotherapy: Secondary | ICD-10-CM | POA: Diagnosis not present

## 2018-02-18 DIAGNOSIS — K219 Gastro-esophageal reflux disease without esophagitis: Secondary | ICD-10-CM | POA: Diagnosis not present

## 2018-02-18 DIAGNOSIS — C3411 Malignant neoplasm of upper lobe, right bronchus or lung: Secondary | ICD-10-CM

## 2018-02-18 DIAGNOSIS — E1136 Type 2 diabetes mellitus with diabetic cataract: Secondary | ICD-10-CM | POA: Insufficient documentation

## 2018-02-18 DIAGNOSIS — E785 Hyperlipidemia, unspecified: Secondary | ICD-10-CM | POA: Diagnosis not present

## 2018-02-18 DIAGNOSIS — J449 Chronic obstructive pulmonary disease, unspecified: Secondary | ICD-10-CM | POA: Diagnosis not present

## 2018-02-18 DIAGNOSIS — C3491 Malignant neoplasm of unspecified part of right bronchus or lung: Secondary | ICD-10-CM | POA: Insufficient documentation

## 2018-02-18 DIAGNOSIS — Z794 Long term (current) use of insulin: Secondary | ICD-10-CM | POA: Diagnosis not present

## 2018-02-18 DIAGNOSIS — Z7901 Long term (current) use of anticoagulants: Secondary | ICD-10-CM | POA: Insufficient documentation

## 2018-02-18 DIAGNOSIS — J440 Chronic obstructive pulmonary disease with acute lower respiratory infection: Secondary | ICD-10-CM | POA: Diagnosis not present

## 2018-02-18 DIAGNOSIS — E1151 Type 2 diabetes mellitus with diabetic peripheral angiopathy without gangrene: Secondary | ICD-10-CM | POA: Diagnosis not present

## 2018-02-18 DIAGNOSIS — J189 Pneumonia, unspecified organism: Secondary | ICD-10-CM | POA: Diagnosis not present

## 2018-02-18 HISTORY — PX: IR US GUIDE VASC ACCESS RIGHT: IMG2390

## 2018-02-18 HISTORY — PX: IR FLUORO GUIDE PORT INSERTION RIGHT: IMG5741

## 2018-02-18 LAB — PROTIME-INR
INR: 1.36
Prothrombin Time: 16.7 seconds — ABNORMAL HIGH (ref 11.4–15.2)

## 2018-02-18 LAB — BASIC METABOLIC PANEL
Anion gap: 11 (ref 5–15)
BUN: 23 mg/dL — AB (ref 6–20)
CALCIUM: 8.6 mg/dL — AB (ref 8.9–10.3)
CO2: 29 mmol/L (ref 22–32)
Chloride: 98 mmol/L — ABNORMAL LOW (ref 101–111)
Creatinine, Ser: 1.2 mg/dL (ref 0.61–1.24)
GFR calc Af Amer: >60 mL/min (ref >60)
GLUCOSE: 143 mg/dL — AB (ref 65–99)
Potassium: 3.7 mmol/L (ref 3.5–5.1)
Sodium: 138 mmol/L (ref 135–145)

## 2018-02-18 LAB — CBC WITH DIFFERENTIAL/PLATELET
BASOS ABS: 0 10*3/uL (ref 0.0–0.1)
BASOS PCT: 0 %
EOS PCT: 0 %
Eosinophils Absolute: 0 10*3/uL (ref 0.0–0.7)
HCT: 42 % (ref 39.0–52.0)
Hemoglobin: 13.8 g/dL (ref 13.0–17.0)
Lymphocytes Relative: 10 %
Lymphs Abs: 0.9 10*3/uL (ref 0.7–4.0)
MCH: 30.1 pg (ref 26.0–34.0)
MCHC: 32.9 g/dL (ref 30.0–36.0)
MCV: 91.7 fL (ref 78.0–100.0)
MONO ABS: 0.9 10*3/uL (ref 0.1–1.0)
Monocytes Relative: 10 %
Neutro Abs: 7.4 10*3/uL (ref 1.7–7.7)
Neutrophils Relative %: 80 %
PLATELETS: 208 10*3/uL (ref 150–400)
RBC: 4.58 MIL/uL (ref 4.22–5.81)
RDW: 14.1 % (ref 11.5–15.5)
WBC: 9.3 10*3/uL (ref 4.0–10.5)

## 2018-02-18 LAB — GLUCOSE, CAPILLARY: GLUCOSE-CAPILLARY: 121 mg/dL — AB (ref 65–99)

## 2018-02-18 MED ORDER — FENTANYL CITRATE (PF) 100 MCG/2ML IJ SOLN
INTRAMUSCULAR | Status: AC
  Filled 2018-02-18: qty 2

## 2018-02-18 MED ORDER — LIDOCAINE-EPINEPHRINE (PF) 2 %-1:200000 IJ SOLN
INTRAMUSCULAR | Status: AC | PRN
  Administered 2018-02-18: 20 mL

## 2018-02-18 MED ORDER — MIDAZOLAM HCL 2 MG/2ML IJ SOLN
INTRAMUSCULAR | Status: AC
  Filled 2018-02-18: qty 2

## 2018-02-18 MED ORDER — LIDOCAINE-EPINEPHRINE (PF) 2 %-1:200000 IJ SOLN
INTRAMUSCULAR | Status: AC
  Filled 2018-02-18: qty 20

## 2018-02-18 MED ORDER — HEPARIN SOD (PORK) LOCK FLUSH 100 UNIT/ML IV SOLN
INTRAVENOUS | Status: AC | PRN
  Administered 2018-02-18: 500 [IU] via INTRAVENOUS

## 2018-02-18 MED ORDER — MIDAZOLAM HCL 2 MG/2ML IJ SOLN
INTRAMUSCULAR | Status: AC | PRN
  Administered 2018-02-18: 0.5 mg via INTRAVENOUS

## 2018-02-18 MED ORDER — HEPARIN SOD (PORK) LOCK FLUSH 100 UNIT/ML IV SOLN
INTRAVENOUS | Status: AC
  Filled 2018-02-18: qty 5

## 2018-02-18 MED ORDER — DEXTROSE 5 % IV SOLN
3.0000 g | INTRAVENOUS | Status: AC
  Administered 2018-02-18: 3 g via INTRAVENOUS
  Filled 2018-02-18: qty 3

## 2018-02-18 MED ORDER — SODIUM CHLORIDE 0.9 % IV SOLN
INTRAVENOUS | Status: DC
  Administered 2018-02-18: 10:00:00 via INTRAVENOUS

## 2018-02-18 NOTE — Progress Notes (Signed)
[place] [facility]  Clinical Social Work was referred by patient  to review and complete healthcare advance directives.  Clinical Social Worker met with patient in infusion room. The patient designated daughter, Huey Bienenstock, as their primary healthcare agent and sister, Kathline Magic, as their secondary agent.  Patient also completed healthcare living will.    Clinical Social Worker notarized documents and made copies for patient/family. Clinical Social Worker will send documents to medical records to be scanned into patient's chart. Clinical Social Worker encouraged patient/family to contact with any additional questions or concerns.  Maryjean Morn, MSW, LCSW Clinical Social Worker [facility] [phone]

## 2018-02-18 NOTE — Discharge Instructions (Signed)
Moderate Conscious Sedation, Adult, Care After °These instructions provide you with information about caring for yourself after your procedure. Your health care provider may also give you more specific instructions. Your treatment has been planned according to current medical practices, but problems sometimes occur. Call your health care provider if you have any problems or questions after your procedure. °What can I expect after the procedure? °After your procedure, it is common: °· To feel sleepy for several hours. °· To feel clumsy and have poor balance for several hours. °· To have poor judgment for several hours. °· To vomit if you eat too soon. ° °Follow these instructions at home: °For at least 24 hours after the procedure: ° °· Do not: °? Participate in activities where you could fall or become injured. °? Drive. °? Use heavy machinery. °? Drink alcohol. °? Take sleeping pills or medicines that cause drowsiness. °? Make important decisions or sign legal documents. °? Take care of children on your own. °· Rest. °Eating and drinking °· Follow the diet recommended by your health care provider. °· If you vomit: °? Drink water, juice, or soup when you can drink without vomiting. °? Make sure you have little or no nausea before eating solid foods. °General instructions °· Have a responsible adult stay with you until you are awake and alert. °· Take over-the-counter and prescription medicines only as told by your health care provider. °· If you smoke, do not smoke without supervision. °· Keep all follow-up visits as told by your health care provider. This is important. °Contact a health care provider if: °· You keep feeling nauseous or you keep vomiting. °· You feel light-headed. °· You develop a rash. °· You have a fever. °Get help right away if: °· You have trouble breathing. °This information is not intended to replace advice given to you by your health care provider. Make sure you discuss any questions you have  with your health care provider. °Document Released: 06/28/2013 Document Revised: 02/10/2016 Document Reviewed: 12/28/2015 °Elsevier Interactive Patient Education © 2018 [employer] ° ° °Implanted Port Insertion, Care After °This sheet gives you information about how to care for yourself after your procedure. Your health care provider may also give you more specific instructions. If you have problems or questions, contact your health care provider. °What can I expect after the procedure? °After your procedure, it is common to have: °· Discomfort at the port insertion site. °· Bruising on the skin over the port. This should improve over 3-4 days. ° °Follow these instructions at home: °Port care °· After your port is placed, you will get a manufacturer's information card. The card has information about your port. Keep this card with you at all times. °· Take care of the port as told by your health care provider. Ask your health care provider if you or a family member can get training for taking care of the port at home. A home health care nurse may also take care of the port. °· Make sure to remember what type of port you have. °Incision care °· Follow instructions from your health care provider about how to take care of your port insertion site. Make sure you: °? Wash your hands with soap and water before you change your bandage (dressing). If soap and water are not available, use hand sanitizer. °? Change your dressing as told by your health care provider.  You may remove your dressing tomorrow. °? Leave skin glue in place. These skin closures   may need to stay in place for 2 weeks or longer. If adhesive strip edges start to loosen and curl up, you may trim the loose edges. Do not remove adhesive strips completely unless your health care provider tells you to do that.  DO NOT use EMLA cream for 2 weeks after port placement as this cream will remove surgical glue on your incision.  Check your port insertion site  every day for signs of infection. Check for: ? More redness, swelling, or pain. ? More fluid or blood. ? Warmth. ? Pus or a bad smell. General instructions  Do not take baths, swim, or use a hot tub until your health care provider approves.  You may shower tomorrow.  Do not lift anything that is heavier than 10 lb (4.5 kg) for a week, or as told by your health care provider.  Ask your health care provider when it is okay to: ? Return to work or school. ? Resume usual physical activities or sports.  Do not drive for 24 hours if you were given a medicine to help you relax (sedative).  Take over-the-counter and prescription medicines only as told by your health care provider.  Wear a medical alert bracelet in case of an emergency. This will tell any health care providers that you have a port.  Keep all follow-up visits as told by your health care provider. This is important. Contact a health care provider if:  You have a fever or chills.  You have more redness, swelling, or pain around your port insertion site.  You have more fluid or blood coming from your port insertion site.  Your port insertion site feels warm to the touch.  You have pus or a bad smell coming from the port insertion site. Get help right away if:  You have chest pain or shortness of breath.  You have bleeding from your port that you cannot control. Summary  Take care of the port as told by your health care provider.  Change your dressing as told by your health care provider.  Keep all follow-up visits as told by your health care provider. This information is not intended to replace advice given to you by your health care provider. Make sure you discuss any questions you have with your health care provider. Document Released: 06/28/2013 Document Revised: 07/29/2016 Document Reviewed: 07/29/2016 Elsevier Interactive Patient Education  2017 [employer].

## 2018-02-18 NOTE — H&P (Signed)
Referring Physician(s): Mohamed,Mohamed  Supervising Physician: Arne Cleveland  Patient Status:  [facility] OP  Chief Complaint:  "I'm here for a port a cath"  Subjective: Patient familiar to IR service from prior right lung mass biopsy and subsequent chest tube placement with pneumothorax on 02/25/2017 as well as right thoracentesis on 12/28/2017 and right Pleurx catheter placement on 01/26/2018.  He has a history of recurrent metastatic right lung carcinoma and presents today for Port-A-Cath placement for additional treatment.  He currently denies fever, headache, chest pain, abdominal/back pain, nausea, vomiting or bleeding.  He does have chronic dyspnea, occasional cough.  Past Medical History:  Diagnosis Date  . Adenomatous colon polyp   . Aortic insufficiency    Echo 3/18: Severe concentric LVH, EF 60-65, normal wall motion, moderate AI, mild LAE, mild TR // Echo 9/18: Mild concentric LVH, EF 60-65, normal wall motion, trivial AI, MAC, mild BAE  . Arthritis    left hip replacement  . Atrial flutter ([place])    onset  2011. s/p EPS/RFA 12/2011  . Cataract   . Chronic bronchitis   . Contact dermatitis and other eczema due to plants (except food)   . COPD (chronic obstructive pulmonary disease) ([place])   . GERD (gastroesophageal reflux disease)   . Hyperlipidemia   . Hypertension   . Lung cancer ([place])   . LV dysfunction    EF 45-50% 12/2011  . OSA (obstructive sleep apnea) 10/13/2016   Mild with AHI 9.7/hr with significant oxygen desaturations as low as 76% now on CPAP  . Other peripheral vascular disease(443.89)    bilateral lower extremity  . Tobacco use disorder    dependent  . Transient global amnesia   . Tuberculosis   . Type II diabetes mellitus ([place])    Past Surgical History:  Procedure Laterality Date  . A FLUTTER ABLATION N/A 12/28/2011   Procedure: ABLATION A FLUTTER;  Surgeon: Evans Lance, MD;  Location[facility] CATH LAB;  Service: Cardiovascular;  Laterality: N/A;  .  CARDIAC CATHETERIZATION N/A 07/10/2015   Procedure: Left Heart Cath and Coronary Angiography;  Surgeon: Sherren Mocha, MD;  Location: [facility] CV LAB;  Service: Cardiovascular;  Laterality: N/A;  . CARDIAC ELECTROPHYSIOLOGY MAPPING AND ABLATION  03/2010  . CARDIOVERSION N/A 07/13/2016   Procedure: CARDIOVERSION;  Surgeon: Skeet Latch, MD;  Location: [facility];  Service: Cardiovascular;  Laterality: N/A;  . COLONOSCOPY    . colonoscopy with polypectomy  2006 & 2011   Dr [name]  . CYSTOSCOPY  11/2007   Dr Jeffie Pollock  . IR GUIDED DRAIN W CATHETER PLACEMENT  01/26/2018  . IR US GUIDE BX ASP/DRAIN  01/26/2018  . PARTIAL HIP ARTHROPLASTY Right 01/2000   "Right; replaced ball & stem"  . PARTIAL HIP ARTHROPLASTY Left   . POLYPECTOMY    . TEE WITHOUT CARDIOVERSION  12/28/2011   Procedure: TRANSESOPHAGEAL ECHOCARDIOGRAM (TEE);  Surgeon: Larey Dresser, MD;  Location: [facility] ENDOSCOPY;  Service: Cardiovascular;  Laterality: N/A;   Allergies: Niacin  Medications: Prior to Admission medications   Medication Sig Start Date End Date Taking? Authorizing Provider  CARTIA XT 180 MG 24 hr capsule TAKE 1 CAPSULE BY MOUTH TWICE A DAY 02/16/18   Sherren Mocha, MD  CRESTOR 10 MG tablet Take 1 tablet (10 mg total) by mouth daily. Patient not taking: Reported on 02/08/2018 01/11/18   Hoyt Koch, MD  doxycycline (VIBRAMYCIN) 50 MG capsule Take 1 capsule (50 mg total) by mouth 2 (two) times daily. 01/28/18  Charlynne Cousins, MD  etanercept (ENBREL SURECLICK) 50 MG/ML injection Inject 50 mg into the skin as directed. Inject on Sundays & Wednesdays     [provider]  Flaxseed, Linseed, (FLAX SEED OIL) 1000 MG CAPS Take 1 capsule by mouth 2 (two) times daily.     [provider]  Fluticasone-Umeclidin-Vilant (TRELEGY ELLIPTA) 100-62.5-25 MCG/INH AEPB Inhale 1 puff into the lungs daily. 10/14/17   Hoyt Koch, MD  folic acid (FOLVITE) 1 MG tablet Take 1 tablet (1 mg total) by  mouth daily. 02/08/18   Curt Bears, MD  furosemide (LASIX) 80 MG tablet Take 1 tablet (80 mg total) by mouth 2 (two) times daily. 12/30/17   Sherren Mocha, MD  guaiFENesin (MUCINEX) 600 MG 12 hr tablet Take 1 tablet (600 mg total) by mouth 2 (two) times daily. 12/29/17   Hosie Poisson, MD  Insulin Glargine (LANTUS SOLOSTAR) 100 UNIT/ML Solostar Pen Inject 30 Units into the skin daily at 10 pm. 12/08/17   Elayne Snare, MD  ipratropium-albuterol (DUONEB) 0.5-2.5 (3) MG/3ML SOLN Take 3 mLs by nebulization every 4 (four) hours as needed. 12/29/17   Hosie Poisson, MD  lidocaine-prilocaine (EMLA) cream Apply 1 application topically as needed. 02/08/18   Curt Bears, MD  liraglutide (VICTOZA) 18 MG/3ML SOPN INJECT 1.8 MILLIGRAM SUBCUTANEOUSLY DAILY 02/12/18   Elayne Snare, MD  losartan (COZAAR) 25 MG tablet TAKE 1 TABLET BY MOUTH ONCE DAILY 12/23/17   Sherren Mocha, MD  metFORMIN (GLUCOPHAGE) 1000 MG tablet TAKE 1 TABLET BY MOUTH TWICE DAILY WITH FOOD 12/20/17   Elayne Snare, MD  metoprolol tartrate (LOPRESSOR) 100 MG tablet take1 tablet by mouth every morning and take 1 and 1/2 tablets by mouth every evening 08/03/17   Sherren Mocha, MD  Multiple Vitamins-Minerals (CENTRUM ADULTS) TABS Take 1 tablet by mouth daily.    [provider]  mupirocin ointment (BACTROBAN) 2 % Apply 1 application topically 2 (two) times daily as needed (skin).     [provider]  nicotine (NICODERM CQ - DOSED IN MG/24 HOURS) 21 mg/24hr patch Place 1 patch (21 mg total) onto the skin daily. 12/30/17   Hosie Poisson, MD  Nintedanib (OFEV) 150 MG CAPS Take 1 capsule (150 mg total) by mouth 2 (two) times daily. 10/29/17   Rigoberto Noel, MD  NOVOFINE 32G X 6 MM MISC use twice a day 03/22/17   Elayne Snare, MD  Omega-3 Fatty Acids (FISH OIL) 1000 MG CAPS Take 1 capsule by mouth 2 (two) times daily.     [provider]  [place] TEST BLOOD SUGAR DAILY 03/22/17   Elayne Snare, MD  potassium  chloride SA (K-DUR,KLOR-CON) 20 MEQ tablet take 1 tablet by mouth twice a day 04/22/17   Imogene Burn, PA-C  potassium chloride SA (K-DUR,KLOR-CON) 20 MEQ tablet Take 1 tablet (20 mEq total) by mouth as directed. Take 2 tablets in am and 1 tablet in pm for 7 days 02/17/18   Curt Bears, MD  prochlorperazine (COMPAZINE) 10 MG tablet TAKE 1 TABLET BY MOUTH EVERY 6 HOURS AS NEEDED FOR NAUSEA OR VOMITING 02/08/18   Curt Bears, MD  senna-docusate (SENOKOT-S) 8.6-50 MG tablet Take 1 tablet by mouth at bedtime as needed for mild constipation. Patient not taking: Reported on 01/11/2018 12/29/17   Hosie Poisson, MD  tamsulosin (FLOMAX) 0.4 MG CAPS capsule Take 0.4 mg by mouth daily.     [provider]  triamcinolone cream (KENALOG) 0.1 % Apply 1 application  topically 2 (two) times daily as needed (affected area/skin).     [provider]  XARELTO 20 MG TABS tablet TAKE 1 TABLET BY MOUTH ONCE DAILY 01/17/18   Sherren Mocha, MD     Vital Signs: BP (!) 95/58 (BP Location: Left Arm)   Pulse 92   Temp 97.7 F (36.5 C) (Oral)   Resp 18   SpO2 90%   Physical Exam awake, alert.  Chest with few left basilar crackles, diminished breath sounds right base; clean, intact right Pleurx catheter.  Heart with normal rate, occasional ectopy noted.  Abdomen obese, soft, positive bowel sounds, nontender.  Imaging: No results found.  Labs:  CBC: Recent Labs    01/25/18 0529 01/26/18 0259 02/08/18 1539 02/17/18 0738  WBC 5.2 11.4* 11.1* 8.0  HGB 12.4* 14.1 14.1 14.0  HCT 38.9* 43.3 43.6 42.2  PLT 139* 185 171 200    COAGS: Recent Labs    02/25/17 0605 01/26/18 0929  INR 1.01 1.08  APTT 33  --     BMP: Recent Labs    01/27/18 0754 01/28/18 0340 02/08/18 1539 02/17/18 0738  NA 142 138 139 137  K 4.5 4.3 3.7 2.9*  CL 102 100* 98 94*  CO2 _0 GLUCOSE 271* 347* 151* 163*  BUN 30* 31* 16 19  CALCIUM 8.8* 8.0* 9.1 9.2  CREATININE 1.21 1.14 1.38* 1.32*    GFRNONAA >60 >60 51* 54*  GFRAA >60 >60 60* >60    LIVER FUNCTION TESTS: Recent Labs    01/10/18 1033 01/24/18 2047 02/08/18 1539 02/17/18 0738  BILITOT 0.6 0.6 0.4 0.3  AST _1 ALT _2 ALKPHOS 51 67 67 66  PROT 6.8 8.3* 7.0 7.0  ALBUMIN 3.3* 3.3* 2.8* 2.8*    Assessment and Plan: Pt with history of recurrent metastatic right lung carcinoma ; presents today for Port-A-Cath placement for additional treatment.  Right Pleurx catheter in place.Risks and benefits of image guided port-a-catheter placement was discussed with the patient including, but not limited to bleeding, infection, pneumothorax, or fibrin sheath development and need for additional procedures.  All of the patient's questions were answered, patient is agreeable to proceed. Consent signed and in chart.  LABS PENDING  Electronically Signed: D. Rowe Robert, PA-C 02/18/2018, 9:54 AM   I spent a total of 25 minutes at the the patient's bedside AND on the patient's hospital floor or unit, greater than 50% of which was counseling/coordinating care for Port-A-Cath placement

## 2018-02-18 NOTE — Sedation Documentation (Signed)
Patient is resting comfortably singing with music playing in background. Denies pain. MD suturing port pocket at this time

## 2018-02-18 NOTE — Procedures (Signed)
  Procedure: R IJ Port catheter   EBL:   minimal Complications:  none immediate  See full dictation in [facility] PACS.  D. Doralee Kocak MD Main # [phone] Pager  [phone]    

## 2018-02-22 ENCOUNTER — Ambulatory Visit: Payer: Medicare Other | Admitting: Pulmonary Disease

## 2018-02-22 DIAGNOSIS — J440 Chronic obstructive pulmonary disease with acute lower respiratory infection: Secondary | ICD-10-CM | POA: Diagnosis not present

## 2018-02-22 DIAGNOSIS — C3491 Malignant neoplasm of unspecified part of right bronchus or lung: Secondary | ICD-10-CM | POA: Diagnosis not present

## 2018-02-22 DIAGNOSIS — J910 Malignant pleural effusion: Secondary | ICD-10-CM | POA: Diagnosis not present

## 2018-02-22 DIAGNOSIS — J441 Chronic obstructive pulmonary disease with (acute) exacerbation: Secondary | ICD-10-CM | POA: Diagnosis not present

## 2018-02-22 DIAGNOSIS — J189 Pneumonia, unspecified organism: Secondary | ICD-10-CM | POA: Diagnosis not present

## 2018-02-22 DIAGNOSIS — E1151 Type 2 diabetes mellitus with diabetic peripheral angiopathy without gangrene: Secondary | ICD-10-CM | POA: Diagnosis not present

## 2018-02-24 ENCOUNTER — Inpatient Hospital Stay: Payer: Medicare Other

## 2018-02-24 ENCOUNTER — Inpatient Hospital Stay: Payer: Medicare Other | Admitting: Medical

## 2018-02-24 ENCOUNTER — Encounter (HOSPITAL_COMMUNITY): Payer: Self-pay | Admitting: Emergency Medicine

## 2018-02-24 ENCOUNTER — Ambulatory Visit (HOSPITAL_COMMUNITY)
Admission: RE | Admit: 2018-02-24 | Discharge: 2018-02-24 | Disposition: A | Discharge status: Home / Self Care | Payer: Medicare Other | Source: Ambulatory Visit | Attending: Medical | Admitting: Medical

## 2018-02-24 ENCOUNTER — Other Ambulatory Visit: Payer: Self-pay

## 2018-02-24 ENCOUNTER — Inpatient Hospital Stay (HOSPITAL_COMMUNITY)
Admission: EM | Admit: 2018-02-24 | Discharge: 2018-03-21 | DRG: 871 | Disposition: E | Payer: Medicare Other | Attending: Internal Medicine | Admitting: Internal Medicine

## 2018-02-24 ENCOUNTER — Inpatient Hospital Stay: Payer: Medicare Other | Attending: Internal Medicine

## 2018-02-24 ENCOUNTER — Inpatient Hospital Stay (HOSPITAL_BASED_OUTPATIENT_CLINIC_OR_DEPARTMENT_OTHER): Payer: Medicare Other | Admitting: Medical

## 2018-02-24 ENCOUNTER — Other Ambulatory Visit: Payer: Self-pay | Admitting: Medical

## 2018-02-24 VITALS — BP 116/79 | HR 150 | Temp 99.1°F | Resp 18

## 2018-02-24 DIAGNOSIS — Z801 Family history of malignant neoplasm of trachea, bronchus and lung: Secondary | ICD-10-CM

## 2018-02-24 DIAGNOSIS — E119 Type 2 diabetes mellitus without complications: Secondary | ICD-10-CM | POA: Diagnosis not present

## 2018-02-24 DIAGNOSIS — T451X5A Adverse effect of antineoplastic and immunosuppressive drugs, initial encounter: Secondary | ICD-10-CM | POA: Diagnosis present

## 2018-02-24 DIAGNOSIS — G4730 Sleep apnea, unspecified: Secondary | ICD-10-CM | POA: Insufficient documentation

## 2018-02-24 DIAGNOSIS — R05 Cough: Secondary | ICD-10-CM | POA: Diagnosis not present

## 2018-02-24 DIAGNOSIS — R509 Fever, unspecified: Secondary | ICD-10-CM

## 2018-02-24 DIAGNOSIS — M129 Arthropathy, unspecified: Secondary | ICD-10-CM

## 2018-02-24 DIAGNOSIS — F17210 Nicotine dependence, cigarettes, uncomplicated: Secondary | ICD-10-CM | POA: Insufficient documentation

## 2018-02-24 DIAGNOSIS — R0602 Shortness of breath: Secondary | ICD-10-CM | POA: Diagnosis not present

## 2018-02-24 DIAGNOSIS — Z515 Encounter for palliative care: Secondary | ICD-10-CM | POA: Diagnosis present

## 2018-02-24 DIAGNOSIS — Z9221 Personal history of antineoplastic chemotherapy: Secondary | ICD-10-CM | POA: Insufficient documentation

## 2018-02-24 DIAGNOSIS — I482 Chronic atrial fibrillation: Secondary | ICD-10-CM | POA: Diagnosis present

## 2018-02-24 DIAGNOSIS — C3411 Malignant neoplasm of upper lobe, right bronchus or lung: Secondary | ICD-10-CM

## 2018-02-24 DIAGNOSIS — E785 Hyperlipidemia, unspecified: Secondary | ICD-10-CM

## 2018-02-24 DIAGNOSIS — J181 Lobar pneumonia, unspecified organism: Secondary | ICD-10-CM | POA: Diagnosis present

## 2018-02-24 DIAGNOSIS — I10 Essential (primary) hypertension: Secondary | ICD-10-CM

## 2018-02-24 DIAGNOSIS — J189 Pneumonia, unspecified organism: Secondary | ICD-10-CM | POA: Insufficient documentation

## 2018-02-24 DIAGNOSIS — D61810 Antineoplastic chemotherapy induced pancytopenia: Secondary | ICD-10-CM | POA: Diagnosis present

## 2018-02-24 DIAGNOSIS — C3490 Malignant neoplasm of unspecified part of unspecified bronchus or lung: Secondary | ICD-10-CM | POA: Diagnosis not present

## 2018-02-24 DIAGNOSIS — R609 Edema, unspecified: Secondary | ICD-10-CM | POA: Diagnosis not present

## 2018-02-24 DIAGNOSIS — D61818 Other pancytopenia: Secondary | ICD-10-CM | POA: Diagnosis not present

## 2018-02-24 DIAGNOSIS — E876 Hypokalemia: Secondary | ICD-10-CM | POA: Diagnosis present

## 2018-02-24 DIAGNOSIS — I4891 Unspecified atrial fibrillation: Secondary | ICD-10-CM | POA: Diagnosis not present

## 2018-02-24 DIAGNOSIS — Z79899 Other long term (current) drug therapy: Secondary | ICD-10-CM

## 2018-02-24 DIAGNOSIS — J9621 Acute and chronic respiratory failure with hypoxia: Secondary | ICD-10-CM | POA: Diagnosis not present

## 2018-02-24 DIAGNOSIS — R0902 Hypoxemia: Secondary | ICD-10-CM

## 2018-02-24 DIAGNOSIS — Z86718 Personal history of other venous thrombosis and embolism: Secondary | ICD-10-CM

## 2018-02-24 DIAGNOSIS — Z006 Encounter for examination for normal comparison and control in clinical research program: Secondary | ICD-10-CM

## 2018-02-24 DIAGNOSIS — Y95 Nosocomial condition: Secondary | ICD-10-CM | POA: Diagnosis present

## 2018-02-24 DIAGNOSIS — Z96642 Presence of left artificial hip joint: Secondary | ICD-10-CM | POA: Diagnosis present

## 2018-02-24 DIAGNOSIS — C3491 Malignant neoplasm of unspecified part of right bronchus or lung: Secondary | ICD-10-CM | POA: Diagnosis present

## 2018-02-24 DIAGNOSIS — G4733 Obstructive sleep apnea (adult) (pediatric): Secondary | ICD-10-CM | POA: Diagnosis present

## 2018-02-24 DIAGNOSIS — K219 Gastro-esophageal reflux disease without esophagitis: Secondary | ICD-10-CM | POA: Insufficient documentation

## 2018-02-24 DIAGNOSIS — Z8249 Family history of ischemic heart disease and other diseases of the circulatory system: Secondary | ICD-10-CM | POA: Diagnosis not present

## 2018-02-24 DIAGNOSIS — J9601 Acute respiratory failure with hypoxia: Secondary | ICD-10-CM | POA: Diagnosis not present

## 2018-02-24 DIAGNOSIS — A419 Sepsis, unspecified organism: Secondary | ICD-10-CM | POA: Diagnosis not present

## 2018-02-24 DIAGNOSIS — J910 Malignant pleural effusion: Secondary | ICD-10-CM | POA: Diagnosis not present

## 2018-02-24 DIAGNOSIS — I4892 Unspecified atrial flutter: Secondary | ICD-10-CM | POA: Diagnosis not present

## 2018-02-24 DIAGNOSIS — I959 Hypotension, unspecified: Secondary | ICD-10-CM | POA: Diagnosis present

## 2018-02-24 DIAGNOSIS — F419 Anxiety disorder, unspecified: Secondary | ICD-10-CM | POA: Diagnosis present

## 2018-02-24 DIAGNOSIS — Z6838 Body mass index (BMI) 38.0-38.9, adult: Secondary | ICD-10-CM

## 2018-02-24 DIAGNOSIS — I110 Hypertensive heart disease with heart failure: Secondary | ICD-10-CM | POA: Diagnosis present

## 2018-02-24 DIAGNOSIS — Z794 Long term (current) use of insulin: Secondary | ICD-10-CM

## 2018-02-24 DIAGNOSIS — I5033 Acute on chronic diastolic (congestive) heart failure: Secondary | ICD-10-CM | POA: Diagnosis present

## 2018-02-24 DIAGNOSIS — Z66 Do not resuscitate: Secondary | ICD-10-CM | POA: Diagnosis present

## 2018-02-24 DIAGNOSIS — E1151 Type 2 diabetes mellitus with diabetic peripheral angiopathy without gangrene: Secondary | ICD-10-CM | POA: Diagnosis present

## 2018-02-24 DIAGNOSIS — J449 Chronic obstructive pulmonary disease, unspecified: Secondary | ICD-10-CM

## 2018-02-24 DIAGNOSIS — R6883 Chills (without fever): Secondary | ICD-10-CM

## 2018-02-24 DIAGNOSIS — Z7901 Long term (current) use of anticoagulants: Secondary | ICD-10-CM | POA: Diagnosis not present

## 2018-02-24 DIAGNOSIS — J849 Interstitial pulmonary disease, unspecified: Secondary | ICD-10-CM | POA: Diagnosis not present

## 2018-02-24 DIAGNOSIS — E669 Obesity, unspecified: Secondary | ICD-10-CM | POA: Diagnosis present

## 2018-02-24 DIAGNOSIS — J440 Chronic obstructive pulmonary disease with acute lower respiratory infection: Secondary | ICD-10-CM | POA: Diagnosis present

## 2018-02-24 DIAGNOSIS — R059 Cough, unspecified: Secondary | ICD-10-CM

## 2018-02-24 DIAGNOSIS — Z7951 Long term (current) use of inhaled steroids: Secondary | ICD-10-CM

## 2018-02-24 DIAGNOSIS — Z833 Family history of diabetes mellitus: Secondary | ICD-10-CM

## 2018-02-24 LAB — COMPREHENSIVE METABOLIC PANEL
ALK PHOS: 61 U/L (ref 38–126)
ALT: 36 U/L (ref 17–63)
ANION GAP: 11 (ref 5–15)
AST: 29 U/L (ref 15–41)
Albumin: 2.7 g/dL — ABNORMAL LOW (ref 3.5–5.0)
BUN: 16 mg/dL (ref 6–20)
CALCIUM: 8.2 mg/dL — AB (ref 8.9–10.3)
CO2: 30 mmol/L (ref 22–32)
Chloride: 98 mmol/L — ABNORMAL LOW (ref 101–111)
Creatinine, Ser: 1.19 mg/dL (ref 0.61–1.24)
GFR calc non Af Amer: >60 mL/min (ref >60)
GLUCOSE: 110 mg/dL — AB (ref 65–99)
Potassium: 3.9 mmol/L (ref 3.5–5.1)
SODIUM: 139 mmol/L (ref 135–145)
Total Bilirubin: 0.6 mg/dL (ref 0.3–1.2)
Total Protein: 6.6 g/dL (ref 6.5–8.1)

## 2018-02-24 LAB — CBC WITH DIFFERENTIAL/PLATELET
Basophils Absolute: 0 10*3/uL (ref 0.0–0.1)
Basophils Relative: 0 %
Eosinophils Absolute: 0.1 10*3/uL (ref 0.0–0.7)
Eosinophils Relative: 2 %
HEMATOCRIT: 35.1 % — AB (ref 39.0–52.0)
HEMOGLOBIN: 11.5 g/dL — AB (ref 13.0–17.0)
LYMPHS ABS: 0.8 10*3/uL (ref 0.7–4.0)
Lymphocytes Relative: 28 %
MCH: 29.8 pg (ref 26.0–34.0)
MCHC: 32.8 g/dL (ref 30.0–36.0)
MCV: 90.9 fL (ref 78.0–100.0)
MONOS PCT: 12 %
Monocytes Absolute: 0.4 10*3/uL (ref 0.1–1.0)
NEUTROS ABS: 1.8 10*3/uL (ref 1.7–7.7)
NEUTROS PCT: 58 %
Platelets: 100 10*3/uL — ABNORMAL LOW (ref 150–400)
RBC: 3.86 MIL/uL — ABNORMAL LOW (ref 4.22–5.81)
RDW: 14.4 % (ref 11.5–15.5)
WBC: 3.1 10*3/uL — ABNORMAL LOW (ref 4.0–10.5)

## 2018-02-24 LAB — LACTIC ACID, PLASMA
Lactic Acid, Venous: 0.9 mmol/L (ref 0.5–1.9)
Lactic Acid, Venous: 0.9 mmol/L (ref 0.5–1.9)

## 2018-02-24 LAB — CMP (CANCER CENTER ONLY)
ALBUMIN: 2.6 g/dL — AB (ref 3.5–5.0)
ALK PHOS: 74 U/L (ref 40–150)
ALT: 31 U/L (ref 0–55)
AST: 26 U/L (ref 5–34)
Anion gap: 13 — ABNORMAL HIGH (ref 3–11)
BUN: 16 mg/dL (ref 7–26)
CALCIUM: 8.8 mg/dL (ref 8.4–10.4)
CHLORIDE: 98 mmol/L (ref 98–109)
CO2: 26 mmol/L (ref 22–29)
CREATININE: 1.15 mg/dL (ref 0.70–1.30)
GFR, Estimated: >60 mL/min (ref >60)
GLUCOSE: 145 mg/dL — AB (ref 70–140)
Potassium: 3.8 mmol/L (ref 3.5–5.1)
SODIUM: 137 mmol/L (ref 136–145)
Total Bilirubin: 0.6 mg/dL (ref 0.2–1.2)
Total Protein: 7 g/dL (ref 6.4–8.3)

## 2018-02-24 LAB — CBC WITH DIFFERENTIAL (CANCER CENTER ONLY)
BASOS ABS: 0 10*3/uL (ref 0.0–0.1)
BASOS PCT: 0 %
EOS ABS: 0.1 10*3/uL (ref 0.0–0.5)
EOS PCT: 1 %
HCT: 36.3 % — ABNORMAL LOW (ref 38.4–49.9)
HEMOGLOBIN: 11.7 g/dL — AB (ref 13.0–17.1)
Lymphocytes Relative: 38 %
Lymphs Abs: 1.4 10*3/uL (ref 0.9–3.3)
MCH: 29.5 pg (ref 27.2–33.4)
MCHC: 32.2 g/dL (ref 32.0–36.0)
MCV: 91.7 fL (ref 79.3–98.0)
Monocytes Absolute: 0.3 10*3/uL (ref 0.1–0.9)
Monocytes Relative: 9 %
NEUTROS PCT: 52 %
Neutro Abs: 1.9 10*3/uL (ref 1.5–6.5)
PLATELETS: 91 10*3/uL — AB (ref 140–400)
RBC: 3.96 MIL/uL — AB (ref 4.20–5.82)
RDW: 14.4 % (ref 11.0–14.6)
WBC: 3.6 10*3/uL — AB (ref 4.0–10.3)

## 2018-02-24 LAB — URINALYSIS, ROUTINE W REFLEX MICROSCOPIC
BILIRUBIN URINE: NEGATIVE
Glucose, UA: NEGATIVE mg/dL
Ketones, ur: NEGATIVE mg/dL
Leukocytes, UA: NEGATIVE
Nitrite: NEGATIVE
PH: 7 (ref 5.0–8.0)
Protein, ur: 30 mg/dL — AB
SPECIFIC GRAVITY, URINE: 1.018 (ref 1.005–1.030)

## 2018-02-24 LAB — I-STAT TROPONIN, ED: Troponin i, poc: 0.01 ng/mL (ref 0.00–0.08)

## 2018-02-24 LAB — I-STAT CG4 LACTIC ACID, ED
LACTIC ACID, VENOUS: 0.93 mmol/L (ref 0.5–1.9)
Lactic Acid, Venous: 2.14 mmol/L (ref 0.5–1.9)

## 2018-02-24 LAB — MRSA PCR SCREENING: MRSA BY PCR: NEGATIVE

## 2018-02-24 LAB — RESEARCH LABS

## 2018-02-24 MED ORDER — FLUTICASONE FUROATE-VILANTEROL 100-25 MCG/INH IN AEPB
1.0000 | INHALATION_SPRAY | Freq: Every day | RESPIRATORY_TRACT | Status: DC
  Filled 2018-02-24: qty 28

## 2018-02-24 MED ORDER — SODIUM CHLORIDE 0.9 % IV BOLUS
1000.0000 mL | Freq: Once | INTRAVENOUS | Status: AC
  Administered 2018-02-24: 1000 mL via INTRAVENOUS

## 2018-02-24 MED ORDER — METOPROLOL TARTRATE 25 MG PO TABS
50.0000 mg | ORAL_TABLET | Freq: Two times a day (BID) | ORAL | Status: DC
  Administered 2018-02-25 – 2018-02-27 (×5): 50 mg via ORAL
  Filled 2018-02-24 (×5): qty 2

## 2018-02-24 MED ORDER — POTASSIUM CHLORIDE CRYS ER 20 MEQ PO TBCR: 20.0000 meq | EXTENDED_RELEASE_TABLET | ORAL | Status: DC

## 2018-02-24 MED ORDER — SODIUM CHLORIDE 0.9 % IV BOLUS (SEPSIS)
1000.0000 mL | Freq: Once | INTRAVENOUS | Status: AC
  Administered 2018-02-24: 1000 mL via INTRAVENOUS

## 2018-02-24 MED ORDER — INSULIN GLARGINE 100 UNIT/ML ~~LOC~~ SOLN
30.0000 [IU] | Freq: Every day | SUBCUTANEOUS | Status: DC
  Administered 2018-02-24 – 2018-02-26 (×3): 30 [IU] via SUBCUTANEOUS
  Filled 2018-02-24 (×4): qty 0.3

## 2018-02-24 MED ORDER — ACETAMINOPHEN 325 MG PO TABS
650.0000 mg | ORAL_TABLET | ORAL | Status: DC | PRN
Start: 2018-02-24 — End: 2018-02-27
  Administered 2018-02-25 – 2018-02-26 (×3): 650 mg via ORAL
  Filled 2018-02-24 (×3): qty 2

## 2018-02-24 MED ORDER — AMIODARONE HCL IN DEXTROSE 360-4.14 MG/200ML-% IV SOLN
30.0000 mg/h | INTRAVENOUS | Status: DC
  Administered 2018-02-25: 30 mg/h via INTRAVENOUS
  Filled 2018-02-24: qty 200

## 2018-02-24 MED ORDER — SENNOSIDES-DOCUSATE SODIUM 8.6-50 MG PO TABS: 1.0000 | ORAL_TABLET | Freq: Every evening | ORAL | Status: DC | PRN

## 2018-02-24 MED ORDER — GUAIFENESIN ER 600 MG PO TB12
600.0000 mg | ORAL_TABLET | Freq: Two times a day (BID) | ORAL | Status: DC
  Administered 2018-02-24 – 2018-02-27 (×6): 600 mg via ORAL
  Filled 2018-02-24 (×6): qty 1

## 2018-02-24 MED ORDER — PHENYLEPHRINE HCL-NACL 10-0.9 MG/250ML-% IV SOLN
0.0000 ug/min | INTRAVENOUS | Status: DC
  Filled 2018-02-24: qty 250

## 2018-02-24 MED ORDER — SODIUM CHLORIDE 0.9 % IV SOLN
2.0000 g | Freq: Three times a day (TID) | INTRAVENOUS | Status: DC
  Administered 2018-02-24 – 2018-02-27 (×8): 2 g via INTRAVENOUS
  Filled 2018-02-24 (×9): qty 2

## 2018-02-24 MED ORDER — AMIODARONE HCL IN DEXTROSE 360-4.14 MG/200ML-% IV SOLN
60.0000 mg/h | INTRAVENOUS | Status: AC
Start: 2018-02-24 — End: 2018-02-25
  Administered 2018-02-24 – 2018-02-25 (×2): 60 mg/h via INTRAVENOUS
  Filled 2018-02-24 (×2): qty 200

## 2018-02-24 MED ORDER — HEPARIN SOD (PORK) LOCK FLUSH 100 UNIT/ML IV SOLN
500.0000 [IU] | INTRAVENOUS | Status: DC | PRN
  Filled 2018-02-24: qty 5

## 2018-02-24 MED ORDER — SODIUM CHLORIDE 0.9 % IV SOLN
0.0000 ug/min | INTRAVENOUS | Status: DC
  Filled 2018-02-24: qty 1

## 2018-02-24 MED ORDER — LACTATED RINGERS IV SOLN
INTRAVENOUS | Status: DC
  Administered 2018-02-24: 19:00:00 via INTRAVENOUS

## 2018-02-24 MED ORDER — RIVAROXABAN 10 MG PO TABS: 10.0000 mg | ORAL_TABLET | Freq: Every day | ORAL | Status: DC

## 2018-02-24 MED ORDER — AMIODARONE IV BOLUS ONLY 150 MG/100ML
150.0000 mg | Freq: Once | INTRAVENOUS | Status: DC
  Administered 2018-02-24: 150 mg via INTRAVENOUS

## 2018-02-24 MED ORDER — ETANERCEPT 50 MG/ML ~~LOC~~ SOAJ: 50.0000 mg | SUBCUTANEOUS | Status: DC

## 2018-02-24 MED ORDER — FUROSEMIDE 40 MG PO TABS
80.0000 mg | ORAL_TABLET | Freq: Two times a day (BID) | ORAL | Status: DC
  Administered 2018-02-24 – 2018-02-25 (×2): 80 mg via ORAL
  Filled 2018-02-24 (×2): qty 2

## 2018-02-24 MED ORDER — RIVAROXABAN 20 MG PO TABS
20.0000 mg | ORAL_TABLET | Freq: Every day | ORAL | Status: DC
  Administered 2018-02-24 – 2018-02-25 (×2): 20 mg via ORAL
  Filled 2018-02-24 (×2): qty 1

## 2018-02-24 MED ORDER — ACETAMINOPHEN 500 MG PO TABS
1000.0000 mg | ORAL_TABLET | Freq: Once | ORAL | Status: AC
  Administered 2018-02-24: 1000 mg via ORAL
  Filled 2018-02-24: qty 2

## 2018-02-24 MED ORDER — UMECLIDINIUM BROMIDE 62.5 MCG/INH IN AEPB
1.0000 | INHALATION_SPRAY | Freq: Every day | RESPIRATORY_TRACT | Status: DC
  Filled 2018-02-24: qty 7

## 2018-02-24 MED ORDER — ONDANSETRON HCL 4 MG/2ML IJ SOLN: 4.0000 mg | Freq: Four times a day (QID) | INTRAMUSCULAR | Status: DC | PRN

## 2018-02-24 MED ORDER — IPRATROPIUM-ALBUTEROL 0.5-2.5 (3) MG/3ML IN SOLN: 3.0000 mL | Freq: Four times a day (QID) | RESPIRATORY_TRACT | Status: DC

## 2018-02-24 MED ORDER — VANCOMYCIN HCL IN DEXTROSE 1-5 GM/200ML-% IV SOLN: 1000.0000 mg | Freq: Three times a day (TID) | INTRAVENOUS | Status: DC

## 2018-02-24 MED ORDER — SODIUM CHLORIDE 0.9 % IV SOLN
2.0000 g | Freq: Once | INTRAVENOUS | Status: AC
  Administered 2018-02-24: 2 g via INTRAVENOUS
  Filled 2018-02-24: qty 2

## 2018-02-24 MED ORDER — DOXYCYCLINE HYCLATE 50 MG PO CAPS: 50.0000 mg | ORAL_CAPSULE | Freq: Two times a day (BID) | ORAL | Status: DC

## 2018-02-24 MED ORDER — VANCOMYCIN HCL 10 G IV SOLR
1750.0000 mg | INTRAVENOUS | Status: DC
  Administered 2018-02-24: 1750 mg via INTRAVENOUS
  Filled 2018-02-24: qty 1750

## 2018-02-24 MED ORDER — INSULIN GLARGINE 100 UNIT/ML SOLOSTAR PEN: 30.0000 [IU] | PEN_INJECTOR | Freq: Every day | SUBCUTANEOUS | Status: DC

## 2018-02-24 MED ORDER — ALBUTEROL SULFATE (2.5 MG/3ML) 0.083% IN NEBU
2.5000 mg | INHALATION_SOLUTION | RESPIRATORY_TRACT | Status: DC | PRN
  Administered 2018-02-25 – 2018-02-27 (×3): 2.5 mg via RESPIRATORY_TRACT
  Filled 2018-02-24 (×2): qty 3

## 2018-02-24 MED ORDER — VANCOMYCIN HCL IN DEXTROSE 1-5 GM/200ML-% IV SOLN
1000.0000 mg | Freq: Once | INTRAVENOUS | Status: AC
  Administered 2018-02-24: 1000 mg via INTRAVENOUS
  Filled 2018-02-24: qty 200

## 2018-02-24 MED ORDER — METOPROLOL TARTRATE 5 MG/5ML IV SOLN
10.0000 mg | Freq: Once | INTRAVENOUS | Status: AC
  Administered 2018-02-24: 10 mg via INTRAVENOUS
  Filled 2018-02-24: qty 10

## 2018-02-24 MED ORDER — SODIUM CHLORIDE 0.9 % IV SOLN: 2.0000 g | Freq: Once | INTRAVENOUS | Status: DC

## 2018-02-24 MED ORDER — SODIUM CHLORIDE 0.9% FLUSH
10.0000 mL | INTRAVENOUS | Status: DC | PRN
  Filled 2018-02-24: qty 10

## 2018-02-24 MED ORDER — IPRATROPIUM BROMIDE 0.02 % IN SOLN
0.5000 mg | Freq: Four times a day (QID) | RESPIRATORY_TRACT | Status: DC
  Administered 2018-02-24 – 2018-02-25 (×2): 0.5 mg via RESPIRATORY_TRACT
  Filled 2018-02-24 (×3): qty 2.5

## 2018-02-24 MED ORDER — FLUTICASONE-UMECLIDIN-VILANT 100-62.5-25 MCG/INH IN AEPB: 1.0000 | INHALATION_SPRAY | Freq: Every day | RESPIRATORY_TRACT | Status: DC

## 2018-02-24 MED ORDER — SODIUM CHLORIDE 0.9 % IV SOLN: 250.0000 mL | INTRAVENOUS | Status: DC | PRN

## 2018-02-24 MED ORDER — ARFORMOTEROL TARTRATE 15 MCG/2ML IN NEBU
15.0000 ug | INHALATION_SOLUTION | Freq: Two times a day (BID) | RESPIRATORY_TRACT | Status: DC
Start: 2018-02-24 — End: 2018-02-25
  Administered 2018-02-24: 15 ug via RESPIRATORY_TRACT
  Filled 2018-02-24 (×2): qty 2

## 2018-02-24 MED ORDER — FOLIC ACID 1 MG PO TABS
1.0000 mg | ORAL_TABLET | Freq: Every day | ORAL | Status: DC
  Administered 2018-02-25 – 2018-02-27 (×3): 1 mg via ORAL
  Filled 2018-02-24 (×4): qty 1

## 2018-02-24 MED ORDER — BUDESONIDE 0.25 MG/2ML IN SUSP
0.2500 mg | Freq: Two times a day (BID) | RESPIRATORY_TRACT | Status: DC
Start: 2018-02-24 — End: 2018-02-25
  Administered 2018-02-24: 0.25 mg via RESPIRATORY_TRACT
  Filled 2018-02-24 (×2): qty 2

## 2018-02-24 NOTE — ED Notes (Signed)
Bed: RESB Expected date:  Expected time:  Means of arrival:  Comments: [facility]

## 2018-02-24 NOTE — ED Provider Notes (Signed)
[facility] DEPT Provider Note   CSN: [id number] Arrival date & time: 03/12/2018  1247     History   Chief Complaint Chief Complaint  Patient presents with  . Pneumonia    HPI Neil SAGAN Sr. is a 67 y.o. male hx of aflutter on xarelto, COPD, GERD, HTN, lung cancer here with hypoxia, cough.  Patient states that he has been having productive cough for the last several days as well as some chills.  Patient also has diffuse weakness as well.  Patient denies any vomiting or sick contacts.  Patient went to oncology center was noted to be in rapid A. fib and hypoxic.  Patient is on 4 L nasal cannula at baseline and was put on nonrebreather.  Patient had a chest x-ray earlier today that showed a pneumonia. Patient sent here for further evaluation and admission.    The history is provided by the patient.    Past Medical History:  Diagnosis Date  . Adenomatous colon polyp   . Aortic insufficiency    Echo 3/18: Severe concentric LVH, EF 60-65, normal wall motion, moderate AI, mild LAE, mild TR // Echo 9/18: Mild concentric LVH, EF 60-65, normal wall motion, trivial AI, MAC, mild BAE  . Arthritis    left hip replacement  . Atrial flutter ([place])    onset  2011. s/p EPS/RFA 12/2011  . Cataract   . Chronic bronchitis   . Contact dermatitis and other eczema due to plants (except food)   . COPD (chronic obstructive pulmonary disease) ([place])   . GERD (gastroesophageal reflux disease)   . Hyperlipidemia   . Hypertension   . Lung cancer ([place])   . LV dysfunction    EF 45-50% 12/2011  . OSA (obstructive sleep apnea) 10/13/2016   Mild with AHI 9.7/hr with significant oxygen desaturations as low as 76% now on CPAP  . Other peripheral vascular disease(443.89)    bilateral lower extremity  . Tobacco use disorder    dependent  . Transient global amnesia   . Tuberculosis   . Type II diabetes mellitus [facility])     Patient Active Problem List   Diagnosis Date  Noted  . Goals of care, counseling/discussion 02/08/2018  . Encounter for antineoplastic chemotherapy 02/08/2018  . Encounter for antineoplastic immunotherapy 02/08/2018  . Acute on chronic respiratory failure with hypoxia ([place]) 01/25/2018  . Malignant pleural effusion 01/25/2018  . Lactic acidosis 01/25/2018  . HCAP (healthcare-associated pneumonia) 01/25/2018  . Postobstructive pneumonia: Probable 01/25/2018  . Malignant neoplasm of right lung ([place])   . Recurrent pleural effusion on right   . Community acquired pneumonia of right middle lobe of lung ([place])   . Tobacco abuse counseling 12/25/2017  . Acute on chronic respiratory failure ([place]) 12/24/2017  . Syncope 05/25/2017  . 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 IPF (idiopathic pulmonary fibrosis) ([place]) 02/16/2017  . Chronic respiratory failure ([place]) 01/15/2017  . Aortic insufficiency   . OSA (obstructive sleep apnea) 10/13/2016  . Obesity (BMI 30-39.9) 10/13/2016  . Nocturnal hypoxemia 10/13/2016  . Lower back pain 09/17/2016  . Chronic anticoagulation-Xarelto (CHADs VASc=5) 02/27/2016  . Persistent atrial fibrillation ([place]) 02/26/2016  . Acute on chronic diastolic CHF (congestive heart failure) ([place]) 02/26/2016  . COPD exacerbation ([place]) 11/10/2015  . Coronary artery disease involving coronary bypass graft of native heart with angina pectoris ([place]) 11/10/2015  . Thrombocytopenia ([place]) 11/10/2015  . Pulmonary HTN ([place]) 11/10/2015  . Angina pectoris syndrome (  [place]) 07/09/2015  . Left ventricular systolic dysfunction 67/54/4920  . Psoriasis 03/06/2010  . Type 2 diabetes mellitus with vascular disease ([place]) 12/30/2009  . Hypertensive heart disease with CHF (congestive heart failure) ([place]) 05/21/2008  . PVD (peripheral vascular disease) with claudication ([place]) 05/21/2008  . Hyperlipidemia 11/22/2007  . Smoker 11/22/2007  . Bronchitis, chronic obstructive, with exacerbation ([place]) 11/22/2007    Past  Surgical History:  Procedure Laterality Date  . A FLUTTER ABLATION N/A 12/28/2011   Procedure: ABLATION A FLUTTER;  Surgeon: Evans Lance, MD;  Location: [facility] CATH LAB;  Service: Cardiovascular;  Laterality: N/A;  . CARDIAC CATHETERIZATION N/A 07/10/2015   Procedure: Left Heart Cath and Coronary Angiography;  Surgeon: Sherren Mocha, MD;  Location: [facility] CV LAB;  Service: Cardiovascular;  Laterality: N/A;  . CARDIAC ELECTROPHYSIOLOGY MAPPING AND ABLATION  03/2010  . CARDIOVERSION N/A 07/13/2016   Procedure: CARDIOVERSION;  Surgeon: Skeet Latch, MD;  Location: [facility];  Service: Cardiovascular;  Laterality: N/A;  . COLONOSCOPY    . colonoscopy with polypectomy  2006 & 2011   Dr [name]  . CYSTOSCOPY  11/2007   Dr Jeffie Pollock  . IR FLUORO GUIDE PORT INSERTION RIGHT  02/18/2018  . IR GUIDED DRAIN W CATHETER PLACEMENT  01/26/2018  . IR US GUIDE BX ASP/DRAIN  01/26/2018  . IR US GUIDE VASC ACCESS RIGHT  02/18/2018  . PARTIAL HIP ARTHROPLASTY Right 01/2000   "Right; replaced ball & stem"  . PARTIAL HIP ARTHROPLASTY Left   . POLYPECTOMY    . TEE WITHOUT CARDIOVERSION  12/28/2011   Procedure: TRANSESOPHAGEAL ECHOCARDIOGRAM (TEE);  Surgeon: Larey Dresser, MD;  Location: [facility];  Service: Cardiovascular;  Laterality: N/A;        Home Medications    Prior to Admission medications   Medication Sig Start Date End Date Taking? Authorizing Provider  CARTIA XT 180 MG 24 hr capsule TAKE 1 CAPSULE BY MOUTH TWICE A DAY 02/16/18   Sherren Mocha, MD  CRESTOR 10 MG tablet Take 1 tablet (10 mg total) by mouth daily. Patient not taking: Reported on 02/08/2018 01/11/18   Hoyt Koch, MD  doxycycline (VIBRAMYCIN) 50 MG capsule Take 1 capsule (50 mg total) by mouth 2 (two) times daily. Patient not taking: Reported on 03/03/2018 01/28/18   Charlynne Cousins, MD  etanercept (ENBREL SURECLICK) 50 MG/ML injection Inject 50 mg into the skin as directed. Inject on Sundays & Wednesdays      [provider]  Flaxseed, Linseed, (FLAX SEED OIL) 1000 MG CAPS Take 1 capsule by mouth 2 (two) times daily.     [provider]  Fluticasone-Umeclidin-Vilant (TRELEGY ELLIPTA) 100-62.5-25 MCG/INH AEPB Inhale 1 puff into the lungs daily. 10/14/17   Hoyt Koch, MD  folic acid (FOLVITE) 1 MG tablet Take 1 tablet (1 mg total) by mouth daily. 02/08/18   Curt Bears, MD  furosemide (LASIX) 80 MG tablet Take 1 tablet (80 mg total) by mouth 2 (two) times daily. 12/30/17   Sherren Mocha, MD  guaiFENesin (MUCINEX) 600 MG 12 hr tablet Take 1 tablet (600 mg total) by mouth 2 (two) times daily. Patient not taking: Reported on 02/22/2018 12/29/17   Hosie Poisson, MD  Insulin Glargine (LANTUS SOLOSTAR) 100 UNIT/ML Solostar Pen Inject 30 Units into the skin daily at 10 pm. 12/08/17   Elayne Snare, MD  ipratropium-albuterol (DUONEB) 0.5-2.5 (3) MG/3ML SOLN Take 3 mLs by nebulization every 4 (four) hours as needed. 12/29/17   Hosie Poisson, MD  lidocaine-prilocaine (  EMLA) cream Apply 1 application topically as needed. 02/08/18   Curt Bears, MD  liraglutide (VICTOZA) 18 MG/3ML SOPN INJECT 1.8 MILLIGRAM SUBCUTANEOUSLY DAILY 02/12/18   Elayne Snare, MD  losartan (COZAAR) 25 MG tablet TAKE 1 TABLET BY MOUTH ONCE DAILY 12/23/17   Sherren Mocha, MD  metFORMIN (GLUCOPHAGE) 1000 MG tablet TAKE 1 TABLET BY MOUTH TWICE DAILY WITH FOOD 12/20/17   Elayne Snare, MD  metoprolol tartrate (LOPRESSOR) 100 MG tablet take1 tablet by mouth every morning and take 1 and 1/2 tablets by mouth every evening 08/03/17   Sherren Mocha, MD  Multiple Vitamins-Minerals (CENTRUM ADULTS) TABS Take 1 tablet by mouth daily.    [provider]  mupirocin ointment (BACTROBAN) 2 % Apply 1 application topically 2 (two) times daily as needed (skin).     [provider]  nicotine (NICODERM CQ - DOSED IN MG/24 HOURS) 21 mg/24hr patch Place 1 patch (21 mg total) onto the skin daily. 12/30/17   Hosie Poisson, MD    Nintedanib (OFEV) 150 MG CAPS Take 1 capsule (150 mg total) by mouth 2 (two) times daily. 10/29/17   Rigoberto Noel, MD  NOVOFINE 32G X 6 MM MISC use twice a day 03/22/17   Elayne Snare, MD  Omega-3 Fatty Acids (FISH OIL) 1000 MG CAPS Take 1 capsule by mouth 2 (two) times daily.     [provider]  [facility] TEST BLOOD SUGAR DAILY 03/22/17   Elayne Snare, MD  potassium chloride SA (K-DUR,KLOR-CON) 20 MEQ tablet take 1 tablet by mouth twice a day 04/22/17   Imogene Burn, PA-C  potassium chloride SA (K-DUR,KLOR-CON) 20 MEQ tablet Take 1 tablet (20 mEq total) by mouth as directed. Take 2 tablets in am and 1 tablet in pm for 7 days 02/17/18   Curt Bears, MD  prochlorperazine (COMPAZINE) 10 MG tablet TAKE 1 TABLET BY MOUTH EVERY 6 HOURS AS NEEDED FOR NAUSEA OR VOMITING 02/08/18   Curt Bears, MD  senna-docusate (SENOKOT-S) 8.6-50 MG tablet Take 1 tablet by mouth at bedtime as needed for mild constipation. Patient not taking: Reported on 01/11/2018 12/29/17   Hosie Poisson, MD  tamsulosin (FLOMAX) 0.4 MG CAPS capsule Take 0.4 mg by mouth daily.     [provider]  triamcinolone cream (KENALOG) 0.1 % Apply 1 application topically 2 (two) times daily as needed (affected area/skin).     [provider]  XARELTO 20 MG TABS tablet TAKE 1 TABLET BY MOUTH ONCE DAILY 01/17/18   Sherren Mocha, MD    Family History Family History  Problem Relation Age of Onset  . Stroke Mother 14  . Hypertension Mother   . Diabetes Mother   . Diabetes Paternal Grandmother   . Lung cancer Father        smoker  . Diabetes Brother   . Heart attack Brother 19  . Diabetes Brother   . Hepatitis C Brother   . Throat cancer Paternal Uncle        1/2 uncle  . Heart attack Brother 68  . Crohn's disease Son   . Colon cancer Neg Hx   . Esophageal cancer Neg Hx   . Rectal cancer Neg Hx   . Stomach cancer Neg Hx     Social History Social History   Tobacco Use  .  Smoking status: Current Every Day Smoker    Packs/day: 1.00    Years: 44.00    Pack years: 44.00    Types: Cigarettes  .  Smokeless tobacco: Never Used  . Tobacco comment: started @ age 39, up to 2 ppd;1.25-1.5 ppd as of 11/23/13  Substance Use Topics  . Alcohol use: No    Alcohol/week: 0.0 oz    Comment:  11/23/13 "2-3 drinks per year"  . Drug use: No     Allergies   Niacin   Review of Systems Review of Systems  Constitutional: Positive for chills and fever.  Respiratory: Positive for cough and shortness of breath.   All other systems reviewed and are negative.    Physical Exam Updated Vital Signs BP 116/81   Pulse (!) 154   Temp (!) 102 F (38.9 C) (Axillary)   Resp (!) 25   SpO2 (!) 87%   Physical Exam  Constitutional: He is oriented to person, place, and time.  Ill appearing, tachypneic   HENT:  Head: Normocephalic.  MM dry   Eyes: Pupils are equal, round, and reactive to light. EOM are normal.  Neck: Normal range of motion. Neck supple.  Cardiovascular:  Tachy, irregular   Pulmonary/Chest:  Crackles L base   Abdominal: Soft. Bowel sounds are normal. He exhibits no distension. There is no tenderness.  Musculoskeletal: Normal range of motion. He exhibits no edema or deformity.  Neurological: He is alert and oriented to person, place, and time. No cranial nerve deficit. Coordination normal.  Skin: Skin is warm.  Psychiatric: He has a normal mood and affect.  Nursing note and vitals reviewed.    ED Treatments / Results  Labs (all labs ordered are listed, but only abnormal results are displayed) Labs Reviewed  COMPREHENSIVE METABOLIC PANEL - Abnormal; Notable for the following components:      Result Value   Chloride 98 (*)    Glucose, Bld 110 (*)    Calcium 8.2 (*)    Albumin 2.7 (*)    All other components within normal limits  CBC WITH DIFFERENTIAL/PLATELET - Abnormal; Notable for the following components:   WBC 3.1 (*)    RBC 3.86 (*)     Hemoglobin 11.5 (*)    HCT 35.1 (*)    Platelets 100 (*)    All other components within normal limits  BLOOD GAS, VENOUS - Abnormal; Notable for the following components:   Bicarbonate 29.9 (*)    Acid-Base Excess 4.6 (*)    All other components within normal limits  I-STAT CG4 LACTIC ACID, ED - Abnormal; Notable for the following components:   Lactic Acid, Venous 2.14 (*)    All other components within normal limits  CULTURE, BLOOD (ROUTINE X 2)  CULTURE, BLOOD (ROUTINE X 2)  URINE CULTURE  URINALYSIS, ROUTINE W REFLEX MICROSCOPIC  I-STAT TROPONIN, ED    EKG None  Radiology Dg Chest 2 View  Result Date: 03/07/2018 CLINICAL DATA:  Shortness of breath, fever.  History of lung cancer. EXAM: CHEST - 2 VIEW COMPARISON:  Radiographs of Jan 24, 2018. FINDINGS: Stable cardiomediastinal silhouette. Interval placement of right internal jugular Port-A-Cath with distal tip in expected position of cavoatrial junction. No pneumothorax is noted. Increased left basilar opacity is noted concerning for worsening edema or possibly pneumonia. Stable right lung opacities are noted concerning for pneumonia or atelectasis with probable loculated pleural effusion. Interval placement of right pleural drain is noted. Bony thorax is unremarkable. IMPRESSION: Worsening left basilar edema or pneumonia. Stable right lung opacities are noted concerning for pneumonia or atelectasis with probable loculated right pleural effusion. Interval placement of right internal jugular Port-A-Cath. Interval placement of right-sided  pleural drainage catheter. Electronically Signed   By: Marijo Conception, M.D.   On: 03/13/2018 12:11    Procedures Procedures (including critical care time)  CRITICAL CARE Performed by: Wandra Arthurs   Total critical care time: 40 minutes  Critical care time was exclusive of separately billable procedures and treating other patients.  Critical care was necessary to treat or prevent imminent or  life-threatening deterioration.  Critical care was time spent personally by me on the following activities: development of treatment plan with patient and/or surrogate as well as nursing, discussions with consultants, evaluation of patient's response to treatment, examination of patient, obtaining history from patient or surrogate, ordering and performing treatments and interventions, ordering and review of laboratory studies, ordering and review of radiographic studies, pulse oximetry and re-evaluation of patient's condition.   Medications Ordered in ED Medications  vancomycin (VANCOCIN) IVPB 1000 mg/200 mL premix (1,000 mg Intravenous New Bag/Given 03/04/2018 1415)  sodium chloride 0.9 % bolus 1,000 mL (1,000 mLs Intravenous New Bag/Given 03/16/2018 1333)    And  sodium chloride 0.9 % bolus 1,000 mL (1,000 mLs Intravenous New Bag/Given 03/08/2018 1419)    And  sodium chloride 0.9 % bolus 1,000 mL (1,000 mLs Intravenous New Bag/Given 03/17/2018 1430)    And  sodium chloride 0.9 % bolus 1,000 mL (has no administration in time range)  metoprolol tartrate (LOPRESSOR) injection 10 mg (has no administration in time range)  ceFEPIme (MAXIPIME) 2 g in sodium chloride 0.9 % 100 mL IVPB (0 g Intravenous Stopped 02/23/2018 1420)  acetaminophen (TYLENOL) tablet 1,000 mg (1,000 mg Oral Given 03/09/2018 1332)     Initial Impression / Assessment and Plan / ED Course  I have reviewed the triage vital signs and the nursing notes.  Pertinent labs & imaging results that were available during my care of the patient were reviewed by me and considered in my medical decision making (see chart for details).     /Neil C Mitro Sr. is a 67 y.o. hx of lung cancer here with cough, fever, rapid afib. I am concerned that he may be septic from pneumonia and associated rapid afib. Code sepsis initiated. Will get labs, lactate, cultures. Will given vanc/cefepime empirically and 30 cc/kg bolus.   2:40 PM HR still 150s. VBG reassuring  on nonrebreather. He is still tachypneic and hypoxic. Lactate elevated likely from pneumonia. Will give lopressor as he is on beta blockers at home. As per previous oncology notes, he might be hospice candidate. Will admit to hospitalist service.   Final Clinical Impressions(s) / ED Diagnoses   Final diagnoses:  None    ED Discharge Orders    None       Drenda Freeze, MD 02/20/2018 1441

## 2018-02-24 NOTE — ED Notes (Signed)
Report called to [facility] at 2W

## 2018-02-24 NOTE — ED Notes (Signed)
EDP paged ICU doctor who will come see pt and make a plan for care

## 2018-02-24 NOTE — Progress Notes (Signed)
eLink Physician-Brief Progress Note Patient Name: Neil Carroll DOB: 09-Mar-1951 MRN: [id number]   Date of Service  03/09/2018  HPI/Events of Note  Afib/flutter with RVR. Pt with soft blood pressures precluding effective attempt at rate control with Cardizem or Metoprolol.  eICU Interventions  Amiodarone bolus + infusion, continue Xarelto, treat hypotension with Phenylephrine infusion for MAP < 60 mmHg        Okoron[place]wo U Ogan 03/06/2018, 10:41 PM

## 2018-02-24 NOTE — ED Notes (Signed)
ICU doctor at bedside

## 2018-02-24 NOTE — ED Notes (Signed)
Pt has urinal and made aware of need for urine specimen.

## 2018-02-24 NOTE — ED Provider Notes (Signed)
Pt signed out by Dr. Darl Householder pending hospitalist admission.  Pt with lung cancer and pna and sepsis.  Pt's BP dropped to 87/52, so the hospitalist asked me to contact ICU attending.  The nurse also told me that he did receive metoprolol 10 mg IV at 1428 as ordered by Dr. Darl Householder for afib with rvr.  The ICU attending (Dr. Lynetta Mare) did call back and will admit patient.  CRITICAL CARE Performed by: Isla Pence   Total critical care time: 30 minutes  Critical care time was exclusive of separately billable procedures and treating other patients.  Critical care was necessary to treat or prevent imminent or life-threatening deterioration.  Critical care was time spent personally by me on the following activities: development of treatment plan with patient and/or surrogate as well as nursing, discussions with consultants, evaluation of patient's response to treatment, examination of patient, obtaining history from patient or surrogate, ordering and performing treatments and interventions, ordering and review of laboratory studies, ordering and review of radiographic studies, pulse oximetry and re-evaluation of patient's condition.   Isla Pence, MD 03/13/2018 [phone]

## 2018-02-24 NOTE — ED Notes (Signed)
Pt reminded of need for urine specimen 

## 2018-02-24 NOTE — ED Triage Notes (Signed)
Pt from [facility] diagnosed with LL pneumonia. Usually uses 4 lpm [place]; on NRB on arrival.

## 2018-02-24 NOTE — Progress Notes (Signed)
A consult was received from an ED physician for vancomycin and cefepime per pharmacy dosing (for an indication other than meningitis). The patient's profile has been reviewed for ht/wt/allergies/indication/available labs. A one time order has been placed for the above antibiotics.  Further antibiotics/pharmacy consults should be ordered by admitting physician if indicated.                       Reuel Boom, PharmD, BCPS [phone] 02/28/2018, 1:17 PM

## 2018-02-24 NOTE — Progress Notes (Signed)
Pharmacy Antibiotic Note  Neil Carroll. is a 67 y.o. male admitted on 02/20/2018 with pneumonia.  Pharmacy has been consulted for vancomycin + cefepime dosing. Patient received vancomycin 1000 mg and cefepime 2 g IV in ED this afternoon ~1400.   Patient has NSCLC and Pleurx catheter. Was admitted from infusion center today with progressive SOB and productive cough.   Today, 03/16/2018  WBC 3.1  Tmax 102 F  Wt = 122 kg in May 2019. Will use this weight for calculations as no weight entered this visit.  SCr 1.2, Adjusted body weight = 94 kg, CrCl ~ 80 mL/min  Plan:  Cefepime 2 g IV q8h  Vancomycin 1000 mg IV received in ED ~ 1400 this afternoon. Will begin vancomycin 1750 mg IV q24h   Goal AUC 400-500  Follow renal function, cultures, and vancomycin level once at steady state  Recommend checking MRSA PCR for potential de-escalation.     Temp (24hrs), Avg:100.6 F (38.1 C), Min:99.1 F (37.3 C), Max:102 F (38.9 C)  Recent Labs  Lab 02/18/18 1000 03/09/2018 1107 02/23/2018 1322 02/20/2018 1332 03/18/2018 1520  WBC 9.3 3.6* 3.1*  --   --   CREATININE 1.20 1.15 1.19  --   --   LATICACIDVEN  --   --   --  2.14* 0.93    CrCl cannot be calculated (Unknown ideal weight.).    Allergies  Allergen Reactions  . Niacin Nausea Only    REACTION: upset stomach   Antimicrobials this admission: cefepime 6/6 >>  vancomycin 6/6 >>   Dose adjustments this admission:  Microbiology results: 6/6 BCx: Sent 6/6 UCx: Sent   Thank you for allowing pharmacy to be a part of this patient's care.  Theodis Shove, PharmD, BCPS Clinical Pharmacist Pager: [phone] 03/03/2018 7:23 PM

## 2018-02-24 NOTE — Progress Notes (Signed)
[facility]  Patient Status: Active (receiving services up to time of hospitalization)  [facility] is providing the following services: RN and PT  If patient discharges after hours, please call [phone].   Neil Carroll 03/09/2018, 3:50 PM

## 2018-02-24 NOTE — ED Notes (Signed)
Pt had blood culture drawn in [place]; 2nd set drawn at present.

## 2018-02-24 NOTE — Progress Notes (Signed)
Unable to get second set of blood cultures before starting abx.  Peripheral stick unsuccessful, pt did not allow further attempts.  Pa Van aware.  Reports given to [facility] in ED.  Pt transferred via [place].

## 2018-02-24 NOTE — Progress Notes (Signed)
Follow up - Critical Care Medicine Note  Patient Details:    Neil Ritchie. is an 67 y.o. male.   Followed by Dr Julien Nordmann for recurrent and metastatic non-small cell lung cancer, adenocarcinoma initially diagnosed as stage IB (T2a, N0, M0) non-small cell lung cancer, squamous cell carcinoma presented with right upper lobe pleural-based mass diagnosed in June 2018.  He has disease recurrence and April 2019 consistent with adenocarcinoma in the right pleural fluid, and undergoes getter drainages via a Pleurx catheter.  Last drainage was 2 days ago with minimal symptom relief.  He was sent over from the infusion center today out of concern that he does not look well.  Indeed, he reports progressive shortness of breath particularly over the last 2 weeks associated productive cough.  His leg edema has been persistent and stable.  In the emergency department he was found to be more hypotensive than usual and hypoxic requiring a nonrebreather.  A code sepsis was initiated and the patient has received 4 L of fluid and broad-spectrum antibiotics.  Clinical perfusion has improved.  Initial lactate is 2.4.  Critical care was consulted for admission.  Past Medical History:  Diagnosis Date  . Adenomatous colon polyp   . Aortic insufficiency    Echo 3/18: Severe concentric LVH, EF 60-65, normal wall motion, moderate AI, mild LAE, mild TR // Echo 9/18: Mild concentric LVH, EF 60-65, normal wall motion, trivial AI, MAC, mild BAE  . Arthritis    left hip replacement  . Atrial flutter ([place])    onset  2011. s/p EPS/RFA 12/2011  . Cataract   . Chronic bronchitis   . Contact dermatitis and other eczema due to plants (except food)   . COPD (chronic obstructive pulmonary disease) ([place])   . GERD (gastroesophageal reflux disease)   . Hyperlipidemia   . Hypertension   . Lung cancer ([place])   . LV dysfunction    EF 45-50% 12/2011  . OSA (obstructive sleep apnea) 10/13/2016   Mild with AHI 9.7/hr with  significant oxygen desaturations as low as 76% now on CPAP  . Other peripheral vascular disease(443.89)    bilateral lower extremity  . Tobacco use disorder    dependent  . Transient global amnesia   . Tuberculosis   . Type II diabetes mellitus ([place])    Past Surgical History:  Procedure Laterality Date  . A FLUTTER ABLATION N/A 12/28/2011   Procedure: ABLATION A FLUTTER;  Surgeon: Evans Lance, MD;  Location: [facility] CATH LAB;  Service: Cardiovascular;  Laterality: N/A;  . CARDIAC CATHETERIZATION N/A 07/10/2015   Procedure: Left Heart Cath and Coronary Angiography;  Surgeon: Sherren Mocha, MD;  Location: [place] CV LAB;  Service: Cardiovascular;  Laterality: N/A;  . CARDIAC ELECTROPHYSIOLOGY MAPPING AND ABLATION  03/2010  . CARDIOVERSION N/A 07/13/2016   Procedure: CARDIOVERSION;  Surgeon: Skeet Latch, MD;  Location: [facility];  Service: Cardiovascular;  Laterality: N/A;  . COLONOSCOPY    . colonoscopy with polypectomy  2006 & 2011   Dr [name]  . CYSTOSCOPY  11/2007   Dr Jeffie Pollock  . IR FLUORO GUIDE PORT INSERTION RIGHT  02/18/2018  . IR GUIDED DRAIN W CATHETER PLACEMENT  01/26/2018  . IR US GUIDE BX ASP/DRAIN  01/26/2018  . IR US GUIDE VASC ACCESS RIGHT  02/18/2018  . PARTIAL HIP ARTHROPLASTY Right 01/2000   "Right; replaced ball & stem"  . PARTIAL HIP ARTHROPLASTY Left   . POLYPECTOMY    . TEE WITHOUT CARDIOVERSION  12/28/2011   Procedure: TRANSESOPHAGEAL ECHOCARDIOGRAM (TEE);  Surgeon: Larey Dresser, MD;  Location: [facility] ENDOSCOPY;  Service: Cardiovascular;  Laterality: N/A;   Family History  Problem Relation Age of Onset  . Stroke Mother 31  . Hypertension Mother   . Diabetes Mother   . Diabetes Paternal Grandmother   . Lung cancer Father        smoker  . Diabetes Brother   . Heart attack Brother 54  . Diabetes Brother   . Hepatitis C Brother   . Throat cancer Paternal Uncle        1/2 uncle  . Heart attack Brother 76  . Crohn's disease Son   . Colon cancer Neg Hx   .  Esophageal cancer Neg Hx   . Rectal cancer Neg Hx   . Stomach cancer Neg Hx    Social History   Socioeconomic History  . Marital status: Widowed    Spouse name: Not on file  . Number of children: 5  . Years of education: Not on file  . Highest education level: Not on file  Occupational History  . Occupation: Presenter, broadcasting  Social Needs  . Financial resource strain: Not on file  . Food insecurity:    Worry: Not on file    Inability: Not on file  . Transportation needs:    Medical: Not on file    Non-medical: Not on file  Tobacco Use  . Smoking status: Current Every Day Smoker    Packs/day: 1.00    Years: 44.00    Pack years: 44.00    Types: Cigarettes  . Smokeless tobacco: Never Used  . Tobacco comment: started @ age 19, up to 2 ppd;1.25-1.5 ppd as of 11/23/13  Substance and Sexual Activity  . Alcohol use: No    Alcohol/week: 0.0 oz    Comment:  11/23/13 "2-3 drinks per year"  . Drug use: No  . Sexual activity: Not Currently  Lifestyle  . Physical activity:    Days per week: Not on file    Minutes per session: Not on file  . Stress: Not on file  Relationships  . Social connections:    Talks on phone: Not on file    Gets together: Not on file    Attends religious service: Not on file    Active member of club or organization: Not on file    Attends meetings of clubs or organizations: Not on file    Relationship status: Not on file  . Intimate partner violence:    Fear of current or ex partner: Not on file    Emotionally abused: Not on file    Physically abused: Not on file    Forced sexual activity: Not on file  Other Topics Concern  . Not on file  Social History Narrative  . Not on file   Review of Systems  Constitutional: Positive for chills and malaise/fatigue. Negative for fever.       Decreased appetite  HENT: Negative.   Eyes: Negative.   Respiratory: Positive for cough, sputum production and shortness of breath.   Cardiovascular: Negative for chest  pain.  Gastrointestinal: Negative for abdominal pain and vomiting.  Genitourinary: Negative.   Musculoskeletal: Negative.   Skin: Negative.   Neurological: Negative.   Endo/Heme/Allergies: Negative.   Psychiatric/Behavioral: Negative.     Lines, Airways, Drains: Implanted Port 02/18/18 Right Chest (Active)  Site Assessment Intact;Clean;Dry 03/19/2018 12:18 PM  Port Intervention Flushed 03/08/2018 12:18 PM  Needle Size H  20 gauge 02/25/2018 11:30 AM  Line Care Connections checked and tightened 03/15/2018 12:18 PM  Line Status Infusing;Flushed;Blood return noted 03/10/2018 12:18 PM  Dressing Type Transparent;Occlusive 02/20/2018 12:18 PM  Dressing Status Clean;Dry;Intact 03/15/2018 12:18 PM  Dressing Intervention New dressing 03/09/2018 11:30 AM     Chest Tube 1 Right Pleural 14 Fr. (Active)     Chest Tube 1 Right;Lateral Pleural 15.5 Fr. (Active)     Closed System Drain (Active)    Anti-infectives:  Anti-infectives (From admission, onward)   Start     Dose/Rate Route Frequency Ordered Stop   03/18/2018 2200  doxycycline (VIBRAMYCIN) 50 MG capsule 50 mg     50 mg Oral 2 times daily 03/11/2018 1804     03/01/2018 2200  ceFEPIme (MAXIPIME) 2 g in sodium chloride 0.9 % 100 mL IVPB     2 g 200 mL/hr over 30 Minutes Intravenous Every 8 hours 03/03/2018 1807 03/03/18 2159   03/03/2018 1815  vancomycin (VANCOCIN) IVPB 1000 mg/200 mL premix     1,000 mg 200 mL/hr over 60 Minutes Intravenous Every 8 hours 02/19/2018 1807     02/23/2018 1315  ceFEPIme (MAXIPIME) 2 g in sodium chloride 0.9 % 100 mL IVPB     2 g 200 mL/hr over 30 Minutes Intravenous  Once 02/20/2018 1305 03/08/2018 1420   03/01/2018 1315  vancomycin (VANCOCIN) IVPB 1000 mg/200 mL premix     1,000 mg 200 mL/hr over 60 Minutes Intravenous  Once 03/11/2018 1305 02/23/2018 1540      Microbiology: Results for orders placed or performed in visit on 03/06/2018  Culture, Blood     Status: None (Preliminary result)   Collection Time: 03/12/2018 11:52 AM  Result  Value Ref Range Status   Specimen Description BLOOD PORTA CATH  Final   Special Requests   Final    BOTTLES DRAWN AEROBIC AND ANAEROBIC Blood Culture adequate volume Performed at [facility], [place] [address], [place], [place] [address]    Culture PENDING  Incomplete   Report Status PENDING  Incomplete    Best Practice/Protocols:  VTE Prophylaxis: Lovenox (prophylaxtic dose) Sepsis (date>>>03/13/2018)  Events:  Code sepsis initiated and received appropriate care bundle.   Studies: Dg Chest 2 View  I have personally reviewed and concur.  Result Date: 02/21/2018 CLINICAL DATA:  Shortness of breath, fever.  History of lung cancer. EXAM: CHEST - 2 VIEW COMPARISON:  Radiographs of Jan 24, 2018. FINDINGS: Stable cardiomediastinal silhouette. Interval placement of right internal jugular Port-A-Cath with distal tip in expected position of cavoatrial junction. No pneumothorax is noted. Increased left basilar opacity is noted concerning for worsening edema or possibly pneumonia. Stable right lung opacities are noted concerning for pneumonia or atelectasis with probable loculated pleural effusion. Interval placement of right pleural drain is noted. Bony thorax is unremarkable. IMPRESSION: Worsening left basilar edema or pneumonia. Stable right lung opacities are noted concerning for pneumonia or atelectasis with probable loculated right pleural effusion. Interval placement of right internal jugular Port-A-Cath. Interval placement of right-sided pleural drainage catheter. Electronically Signed   By: Marijo Conception, M.D.   On: 02/23/2018 12:11   Consults: PCCM    Subjective:    Feels better than on arrival.  Overnight Issues:  N/A  Objective:  Vital signs for last 24 hours: Temp:  [99.1 F (37.3 C)-102 F (38.9 C)] 102 F (38.9 C) (06/06 1253) Pulse Rate:  [83-168] 119 (06/06 1738) Resp:  [13-32] 19 (06/06 1738) BP: (87-173)/(52-148) 90/60 (06/06 1759)  SpO2:  [85 %-98 %] 98 % (06/06  1738)  Hemodynamic parameters for last 24 hours:    Intake/Output from previous day: No intake/output data recorded.  Intake/Output this shift: No intake/output data recorded.  Vent settings for last 24 hours:  On 100% non-rebreather  Physical Exam:  General: alert and moderate respiratory distress Neuro: alert, oriented and nonfocal exam HEENT/Neck: no JVD Resp: rhonchi bilaterally CVS: regular rate and rhythm, S1, S2 normal, no murmur, click, rub or gallop GI: soft, nontender, BS WNL, no r/g and obese Skin: no rash Extremities: no edema, no erythema, pulses WNL and edema 3+  Assessment/Plan:   NEURO  Neurological intact with no pain.   Plan: non-opioid analgesia for now.  PULM  Pneumonia: community-acquired organism not specified Acute on Chronic Respiratory Failure not due to CHF   Plan: Continue Oxygen therapy. HFNC may be more comfortable and may decrease risk of intubation. Continue home bronchodilators  CARDIO  Hypotension due to sepsis. Difficult to obtain accurate BP, but clinical perfusion appears adequate. Atrial fibrillation   Plan: Continue gentle hydration - has receive adequate initial resuscitation. Initiate vasopressors if lactate not clearing. Continue Xarelto and metoprolol, hold diltiazem in light of hypotension.  RENAL  No renal failure at this time.   Plan: Monitor  GI  Nutritional Deficiency moderate nutritional risk as appetite decreasing.   Plan: Diet as tolerated.  ID  Pneumonia (community-acquired , unspecified organism.)   Plan: Continue 7 days of antibiotics and tailor as culture data allows.  HEME  Known NSCLC.    Plan: Consider imaging and init  ENDO Diabetes Mellitus (Type 2, on insulin and OHA at home)   Plan: Reduced Insulin regimen given poor intake.  Global Issues   Will admit to the ICU, prognosis is guarded.  Full code for now as per prior Advanced Directives.   LOS: 0 days   Additional comments:I have discussed and  reviewed with family members patient's wife.  Critical Care Total Time*: 45 Minutes  Neil Carroll 03/08/2018  *Care during the described time interval was provided by me and/or other providers on the critical care team.  I have reviewed this patient's available data, including medical history, events of note, physical examination and test results as part of my evaluation.

## 2018-02-24 NOTE — Progress Notes (Signed)
ANTICOAGULATION CONSULT NOTE - Initial Consult  Pharmacy Consult for rivaroxaban Indication: atrial fibrillation  Allergies  Allergen Reactions  . Niacin Nausea Only    REACTION: upset stomach    Patient Measurements:    Vital Signs: Temp: 102 F (38.9 C) (06/06 1253) Temp Source: Axillary (06/06 1253) BP: 90/60 (06/06 1759) Pulse Rate: 119 (06/06 1738)  Labs: Recent Labs    03/20/2018 1107 03/15/2018 1322  HGB 11.7* 11.5*  HCT 36.3* 35.1*  PLT 91* 100*  CREATININE 1.15 1.19    CrCl cannot be calculated (Unknown ideal weight.).   Medical History:  Medications:  Scheduled:  . Fluticasone-Umeclidin-Vilant  1 puff Inhalation Daily  . folic acid  1 mg Oral Daily  . furosemide  80 mg Oral BID  . guaiFENesin  600 mg Oral BID  . Insulin Glargine  30 Units Subcutaneous Q2200  . ipratropium-albuterol  3 mL Nebulization Q6H  . [START ON 02/25/2018] metoprolol tartrate  50 mg Oral BID  . potassium chloride SA  20 mEq Oral UD  . [START ON 02/25/2018] rivaroxaban  20 mg Oral Q supper   Infusions:  . sodium chloride    . ceFEPime (MAXIPIME) IV    . lactated ringers 75 mL/hr at 02/28/2018 1833  . vancomycin      Assessment: Pharmacy consulted to assist with dosing/monitoring rivaroxaban in this 67 year old male who was taking rivaroxaban PTA for atrial fibrillation. Patient has NSCLC and appears to be undergoing chemotherapy (Keytruda, Alimta, & Carboplatin charted on 02/17/18).   CrCl ~ 80 mL/min using wt = 122 kg charted in May 2019 as weight has not been entered on chart this admission despite several requests.  Goal of Therapy:  Monitor platelets by anticoagulation protocol: Yes   Plan:   Resume rivaroxaban 20 mg PO daily  Closely monitor CBC given recent chemotherapy  Lenis Noon, PharmD, BCPS Clinical Pharmacist 03/20/2018,7:34 PM

## 2018-02-24 NOTE — Patient Instructions (Signed)
Implanted Port Home Guide An implanted port is a type of central line that is placed under the skin. Central lines are used to provide IV access when treatment or nutrition needs to be given through a person's veins. Implanted ports are used for long-term IV access. An implanted port may be placed because:  You need IV medicine that would be irritating to the small veins in your hands or arms.  You need long-term IV medicines, such as antibiotics.  You need IV nutrition for a long period.  You need frequent blood draws for lab tests.  You need dialysis.  Implanted ports are usually placed in the chest area, but they can also be placed in the upper arm, the abdomen, or the leg. An implanted port has two main parts:  Reservoir. The reservoir is round and will appear as a small, raised area under your skin. The reservoir is the part where a needle is inserted to give medicines or draw blood.  Catheter. The catheter is a thin, flexible tube that extends from the reservoir. The catheter is placed into a large vein. Medicine that is inserted into the reservoir goes into the catheter and then into the vein.  How will I care for my incision site? Do not get the incision site wet. Bathe or shower as directed by your health care provider. How is my port accessed? Special steps must be taken to access the port:  Before the port is accessed, a numbing cream can be placed on the skin. This helps numb the skin over the port site.  Your health care provider uses a sterile technique to access the port. ? Your health care provider must put on a mask and sterile gloves. ? The skin over your port is cleaned carefully with an antiseptic and allowed to dry. ? The port is gently pinched between sterile gloves, and a needle is inserted into the port.  Only "non-coring" port needles should be used to access the port. Once the port is accessed, a blood return should be checked. This helps ensure that the port  is in the vein and is not clogged.  If your port needs to remain accessed for a constant infusion, a clear (transparent) bandage will be placed over the needle site. The bandage and needle will need to be changed every week, or as directed by your health care provider.  Keep the bandage covering the needle clean and dry. Do not get it wet. Follow your health care provider's instructions on how to take a shower or bath while the port is accessed.  If your port does not need to stay accessed, no bandage is needed over the port.  What is flushing? Flushing helps keep the port from getting clogged. Follow your health care provider's instructions on how and when to flush the port. Ports are usually flushed with saline solution or a medicine called heparin. The need for flushing will depend on how the port is used.  If the port is used for intermittent medicines or blood draws, the port will need to be flushed: ? After medicines have been given. ? After blood has been drawn. ? As part of routine maintenance.  If a constant infusion is running, the port may not need to be flushed.  How long will my port stay implanted? The port can stay in for as long as your health care provider thinks it is needed. When it is time for the port to come out, surgery will be   done to remove it. The procedure is similar to the one performed when the port was put in. When should I seek immediate medical care? When you have an implanted port, you should seek immediate medical care if:  You notice a bad smell coming from the incision site.  You have swelling, redness, or drainage at the incision site.  You have more swelling or pain at the port site or the surrounding area.  You have a fever that is not controlled with medicine.  This information is not intended to replace advice given to you by your health care provider. Make sure you discuss any questions you have with your health care provider. Document  Released: 09/07/2005 Document Revised: 02/13/2016 Document Reviewed: 05/15/2013 Elsevier Interactive Patient Education  2017 [employer] Inc[employer]  

## 2018-02-24 NOTE — Progress Notes (Signed)
Patient presented to injection room for scheduled PAC access and lab draw. Noted patient to have chills, cough and tachypnea. C/o progressive productive cough x 1-2 weeks. States he has had intermittent chills for "a few days." Patient c/o worsening dyspnea. Sandi Mealy, PA-C notified and came to evaluate patient in injection room. Care turned over to [facility] for further evaluation and treatment.

## 2018-02-25 ENCOUNTER — Inpatient Hospital Stay (HOSPITAL_COMMUNITY): Payer: Medicare Other

## 2018-02-25 DIAGNOSIS — J849 Interstitial pulmonary disease, unspecified: Secondary | ICD-10-CM

## 2018-02-25 DIAGNOSIS — J910 Malignant pleural effusion: Secondary | ICD-10-CM

## 2018-02-25 DIAGNOSIS — J9601 Acute respiratory failure with hypoxia: Secondary | ICD-10-CM

## 2018-02-25 LAB — STREP PNEUMONIAE URINARY ANTIGEN: STREP PNEUMO URINARY ANTIGEN: NEGATIVE

## 2018-02-25 LAB — BASIC METABOLIC PANEL
ANION GAP: 11 (ref 5–15)
BUN: 10 mg/dL (ref 6–20)
CALCIUM: 7.7 mg/dL — AB (ref 8.9–10.3)
CO2: 28 mmol/L (ref 22–32)
Chloride: 100 mmol/L — ABNORMAL LOW (ref 101–111)
Creatinine, Ser: 0.95 mg/dL (ref 0.61–1.24)
GLUCOSE: 113 mg/dL — AB (ref 65–99)
POTASSIUM: 3.3 mmol/L — AB (ref 3.5–5.1)
Sodium: 139 mmol/L (ref 135–145)

## 2018-02-25 LAB — BLOOD GAS, VENOUS
ACID-BASE EXCESS: 4.6 mmol/L — AB (ref 0.0–2.0)
BICARBONATE: 29.9 mmol/L — AB (ref 20.0–28.0)
O2 Saturation: 36.3 %
PCO2 VEN: 49.8 mmHg (ref 44.0–60.0)
PH VEN: 7.397 (ref 7.250–7.430)
Patient temperature: 98.6

## 2018-02-25 LAB — CBC
HEMATOCRIT: 32.7 % — AB (ref 39.0–52.0)
Hemoglobin: 10.7 g/dL — ABNORMAL LOW (ref 13.0–17.0)
MCH: 30.1 pg (ref 26.0–34.0)
MCHC: 32.7 g/dL (ref 30.0–36.0)
MCV: 91.9 fL (ref 78.0–100.0)
Platelets: 86 10*3/uL — ABNORMAL LOW (ref 150–400)
RBC: 3.56 MIL/uL — ABNORMAL LOW (ref 4.22–5.81)
RDW: 14.4 % (ref 11.5–15.5)
WBC: 3.1 10*3/uL — AB (ref 4.0–10.5)

## 2018-02-25 LAB — EXPECTORATED SPUTUM ASSESSMENT W REFEX TO RESP CULTURE

## 2018-02-25 LAB — MAGNESIUM: Magnesium: 0.8 mg/dL — CL (ref 1.7–2.4)

## 2018-02-25 LAB — URINE CULTURE

## 2018-02-25 LAB — GLUCOSE, CAPILLARY: GLUCOSE-CAPILLARY: 136 mg/dL — AB (ref 65–99)

## 2018-02-25 LAB — EXPECTORATED SPUTUM ASSESSMENT W GRAM STAIN, RFLX TO RESP C

## 2018-02-25 LAB — PROCALCITONIN

## 2018-02-25 MED ORDER — DILTIAZEM HCL 60 MG PO TABS
60.0000 mg | ORAL_TABLET | Freq: Four times a day (QID) | ORAL | Status: DC
  Administered 2018-02-25 – 2018-02-27 (×8): 60 mg via ORAL
  Filled 2018-02-25 (×8): qty 1

## 2018-02-25 MED ORDER — SALINE SPRAY 0.65 % NA SOLN
1.0000 | Freq: Four times a day (QID) | NASAL | Status: DC
  Administered 2018-02-25 – 2018-02-27 (×8): 1 via NASAL
  Filled 2018-02-25: qty 44

## 2018-02-25 MED ORDER — IPRATROPIUM BROMIDE 0.02 % IN SOLN: 0.5000 mg | Freq: Four times a day (QID) | RESPIRATORY_TRACT | Status: DC

## 2018-02-25 MED ORDER — ARFORMOTEROL TARTRATE 15 MCG/2ML IN NEBU
15.0000 ug | INHALATION_SOLUTION | Freq: Two times a day (BID) | RESPIRATORY_TRACT | Status: DC
  Administered 2018-02-25 – 2018-02-27 (×5): 15 ug via RESPIRATORY_TRACT
  Filled 2018-02-25 (×4): qty 2

## 2018-02-25 MED ORDER — INSULIN ASPART 100 UNIT/ML ~~LOC~~ SOLN
0.0000 [IU] | Freq: Three times a day (TID) | SUBCUTANEOUS | Status: DC
  Administered 2018-02-25: 1 [IU] via SUBCUTANEOUS
  Administered 2018-02-26: 2 [IU] via SUBCUTANEOUS

## 2018-02-25 MED ORDER — LORAZEPAM 2 MG/ML IJ SOLN
0.5000 mg | INTRAMUSCULAR | Status: DC | PRN
  Administered 2018-02-25 – 2018-02-27 (×5): 1 mg via INTRAVENOUS
  Filled 2018-02-25 (×5): qty 1

## 2018-02-25 MED ORDER — ARFORMOTEROL TARTRATE 15 MCG/2ML IN NEBU: 15.0000 ug | INHALATION_SOLUTION | Freq: Two times a day (BID) | RESPIRATORY_TRACT | Status: DC

## 2018-02-25 MED ORDER — BUDESONIDE 0.5 MG/2ML IN SUSP: 0.5000 mg | Freq: Two times a day (BID) | RESPIRATORY_TRACT | Status: DC

## 2018-02-25 MED ORDER — METOPROLOL TARTRATE 5 MG/5ML IV SOLN
2.5000 mg | Freq: Once | INTRAVENOUS | Status: AC
  Administered 2018-02-25: 2.5 mg via INTRAVENOUS
  Filled 2018-02-25: qty 5

## 2018-02-25 MED ORDER — ROSUVASTATIN CALCIUM 10 MG PO TABS
10.0000 mg | ORAL_TABLET | Freq: Every day | ORAL | Status: DC
  Administered 2018-02-25 – 2018-02-27 (×3): 10 mg via ORAL
  Filled 2018-02-25 (×3): qty 1

## 2018-02-25 MED ORDER — METOPROLOL TARTRATE 5 MG/5ML IV SOLN
INTRAVENOUS | Status: AC
  Filled 2018-02-25: qty 5

## 2018-02-25 MED ORDER — METOPROLOL TARTRATE 5 MG/5ML IV SOLN
2.5000 mg | Freq: Once | INTRAVENOUS | Status: AC
  Administered 2018-02-25: 2.5 mg via INTRAVENOUS

## 2018-02-25 MED ORDER — NICOTINE 21 MG/24HR TD PT24
21.0000 mg | MEDICATED_PATCH | Freq: Every day | TRANSDERMAL | Status: DC
  Administered 2018-02-25 – 2018-02-27 (×3): 21 mg via TRANSDERMAL
  Filled 2018-02-25 (×2): qty 1

## 2018-02-25 MED ORDER — DILTIAZEM HCL ER COATED BEADS 180 MG PO CP24
180.0000 mg | ORAL_CAPSULE | Freq: Two times a day (BID) | ORAL | Status: DC
  Administered 2018-02-25: 180 mg via ORAL
  Filled 2018-02-25: qty 1

## 2018-02-25 MED ORDER — FLUTICASONE PROPIONATE 50 MCG/ACT NA SUSP
1.0000 | Freq: Every day | NASAL | Status: DC
  Administered 2018-02-25 – 2018-02-27 (×3): 1 via NASAL
  Filled 2018-02-25: qty 16

## 2018-02-25 MED ORDER — POTASSIUM CHLORIDE CRYS ER 20 MEQ PO TBCR
20.0000 meq | EXTENDED_RELEASE_TABLET | ORAL | Status: AC
  Administered 2018-02-25 (×2): 20 meq via ORAL
  Filled 2018-02-25 (×2): qty 1

## 2018-02-25 MED ORDER — FUROSEMIDE 10 MG/ML IJ SOLN
40.0000 mg | Freq: Two times a day (BID) | INTRAMUSCULAR | Status: DC
  Administered 2018-02-25 – 2018-02-27 (×4): 40 mg via INTRAVENOUS
  Filled 2018-02-25 (×4): qty 4

## 2018-02-25 MED ORDER — BUDESONIDE 0.5 MG/2ML IN SUSP
0.5000 mg | Freq: Two times a day (BID) | RESPIRATORY_TRACT | Status: DC
Start: 2018-02-25 — End: 2018-02-27
  Administered 2018-02-25 – 2018-02-27 (×5): 0.5 mg via RESPIRATORY_TRACT
  Filled 2018-02-25 (×5): qty 2

## 2018-02-25 MED ORDER — MAGNESIUM SULFATE 4 GM/100ML IV SOLN
4.0000 g | Freq: Once | INTRAVENOUS | Status: AC
  Administered 2018-02-25: 4 g via INTRAVENOUS
  Filled 2018-02-25: qty 100

## 2018-02-25 MED ORDER — IPRATROPIUM BROMIDE 0.02 % IN SOLN
0.5000 mg | Freq: Four times a day (QID) | RESPIRATORY_TRACT | Status: DC
  Administered 2018-02-25 – 2018-02-27 (×9): 0.5 mg via RESPIRATORY_TRACT
  Filled 2018-02-25 (×8): qty 2.5

## 2018-02-25 MED ORDER — BENZONATATE 100 MG PO CAPS
200.0000 mg | ORAL_CAPSULE | Freq: Three times a day (TID) | ORAL | Status: DC | PRN
  Administered 2018-02-25 – 2018-02-26 (×3): 200 mg via ORAL
  Filled 2018-02-25 (×3): qty 2

## 2018-02-25 MED ORDER — METOPROLOL TARTRATE 5 MG/5ML IV SOLN
5.0000 mg | INTRAVENOUS | Status: DC | PRN
  Administered 2018-02-25 – 2018-02-27 (×6): 5 mg via INTRAVENOUS
  Filled 2018-02-25 (×6): qty 5

## 2018-02-25 MED ORDER — VANCOMYCIN HCL IN DEXTROSE 1-5 GM/200ML-% IV SOLN
1000.0000 mg | Freq: Two times a day (BID) | INTRAVENOUS | Status: DC
  Administered 2018-02-25 – 2018-02-26 (×2): 1000 mg via INTRAVENOUS
  Filled 2018-02-25 (×2): qty 200

## 2018-02-25 NOTE — Progress Notes (Signed)
[place] Progress Note Patient Name: Neil Carroll DOB: Jul 17, 1951 MRN: [id number]   Date of Service  02/25/2018  HPI/Events of Note  Magnesium 0.8  eICU Interventions  Magnesium 4 gm iv , recheck magnesium level at 9:00 AM        [name] U [name] 02/25/2018, 6:46 AM

## 2018-02-25 NOTE — Progress Notes (Signed)
[facility] ADULT ICU REPLACEMENT PROTOCOL FOR AM LAB REPLACEMENT ONLY  The patient does apply for the [facility] Adult ICU Electrolyte Replacment Protocol based on the criteria listed below:   1. Is GFR >/= 40 ml/min? Yes.    Patient's GFR today is >60 2. Is urine output >/= 0.5 ml/kg/hr for the last 6 hours? Yes.   Patient's UOP is 1.2 ml/kg/hr 3. Is BUN < 60 mg/dL? Yes.    Patient's BUN today is 10 4. Abnormal electrolyte(s): K-3.3 5. Ordered repletion with: per protocol 6. If a panic level lab has been reported, has the CCM MD in charge been notified? Yes.  .   Physician:  Dr. Jonetta Speak, Philis Nettle 02/25/2018 6:46 AM

## 2018-02-25 NOTE — Progress Notes (Signed)
These preliminary result these preliminary results were noted.  Awaiting final report.

## 2018-02-25 NOTE — Progress Notes (Signed)
[facility] Progress Note   Neil Carroll [id number] 07-Oct-1950 67 y.o.  Neil Housekeeper Sr. is managed by Dr. Fanny Bien. Neil Carroll   Actively treated with chemotherapy: yes  Current Therapy: Carboplatin, Keytruda, and Alimta  Last Treated: 02/17/2018 (cycle 1, day 1)  Assessment: Plan:    Fever, unspecified fever cause - Plan: Culture, Blood  SOB (shortness of breath) - Plan: Culture, Blood  Cough - Plan: Culture, Blood  Chills - Plan: Culture, Blood  Pneumonia of left lower lobe due to infectious organism ([place]) - Plan: DISCONTINUED: ceFEPIme (MAXIPIME) 2 g in sodium chloride 0.9 % 100 mL IVPB   Cough, fever, shortness of breath, and chills: The patient was referred for a chest x-ray which returned showing left basilar edema or pneumonia.  Also noted was a stable right lung opacity concerning for pneumonia or atelectasis with a probable loculated right pleural effusion.  Pneumonia of the left lower lobe with hypoxia: The patient was noted to have hypoxia with an oxygen saturation of 81% despite 6 L of oxygen via a nasal cannula: He was transported to the emergency room for further evaluation and management.  Please see After Visit Summary for patient specific instructions.  Future Appointments  Date Time Provider [place]  03/03/2018 11:00 AM [facility] LAB 1 [facility] None  03/04/2018  9:30 AM Rigoberto Noel, MD [facility] None  03/10/2018 11:00 AM [facility] ONLY [facility] None  03/10/2018 11:30 AM Alla Feeling, NP [facility] None  03/10/2018 12:30 PM [facility] I26 DNS [facility] None  03/17/2018 10:00 AM Elayne Snare, MD [facility] None  03/17/2018 11:45 AM [facility] LAB 1 [facility] None  03/23/2018 11:15 AM [facility] LAB 2 [facility] None  03/31/2018  9:30 AM [facility] LAB 1 [facility] None  03/31/2018 10:00 AM Curt Bears, MD [facility] None  03/31/2018 11:00 AM [facility] I25 DNS [facility] None    04/05/2018 10:20 AM Hoyt Koch, MD [facility]  04/07/2018 11:00 AM [facility] LAB 4 [facility] None  04/14/2018 11:15 AM [facility] LAB 3 [facility] None  04/19/2018 10:15 AM Richardson Dopp T, PA-C [facility]  04/21/2018  9:45 AM [facility] LAB 1 [facility] None  04/21/2018 10:15 AM Curt Bears, MD [facility] None  04/21/2018 11:00 AM [facility] H30 [facility] None    Orders Placed This Encounter  Procedures  . Culture, Blood       Subjective:   Patient ID:  Neil Carter. is a 67 y.o. (DOB [birth year]) male.  Chief Complaint:  Chief Complaint  Patient presents with  . Shortness of Breath    HPI Neil Castellana. is a 67 year old male with a history of a recurrent and metastatic non-small cell lung cancer, adenocarcinoma which was initially diagnosed as a stage Ib (T2 a, in 0, M0) non-small cell lung cancer, squamous cell carcinoma when he presented with a right upper lobe pleural-based mass in June 2018.  He was found to have a disease recurrence in April 2019 consistent with an adenocarcinoma and the right pleural fluid.  The patient is managed by Dr. Fanny Bien. Neil Carroll and was most recently treated with cycle 1, day 1 of carboplatin, Keytruda, and Alimta on 02/17/2018.  He presents to the office today with a progressive cough, shortness of breath, and dyspnea on exertion despite receiving oxygen at 4 L/min via nasal cannula.  Neil Carroll initially presented to the injection room today for scheduled access of his Port-A-Cath with lab draws.  At that time he was noted to  have chills, cough, and tachypnea.  The patient reported that he had had a 1 to 2-week history of progressive cough with intermittent chills over the "last few days."  He continues to have bilateral lower extremity edema and has Unna boots in place.  The patient has a history of A. fib and reports that he is taken all of his medications this morning.  Medications: I have  reviewed the patient's current medications.  Allergies:  Allergies  Allergen Reactions  . Niacin Nausea Only    REACTION: upset stomach    Past Medical History:  Diagnosis Date  . Adenomatous colon polyp   . Aortic insufficiency    Echo 3/18: Severe concentric LVH, EF 60-65, normal wall motion, moderate AI, mild LAE, mild TR // Echo 9/18: Mild concentric LVH, EF 60-65, normal wall motion, trivial AI, MAC, mild BAE  . Arthritis    left hip replacement  . Atrial flutter ([place])    onset  2011. s/p EPS/RFA 12/2011  . Cataract   . Chronic bronchitis   . Contact dermatitis and other eczema due to plants (except food)   . COPD (chronic obstructive pulmonary disease) ([place])   . GERD (gastroesophageal reflux disease)   . Hyperlipidemia   . Hypertension   . Lung cancer ([place])   . LV dysfunction    EF 45-50% 12/2011  . OSA (obstructive sleep apnea) 10/13/2016   Mild with AHI 9.7/hr with significant oxygen desaturations as low as 76% now on CPAP  . Other peripheral vascular disease(443.89)    bilateral lower extremity  . Tobacco use disorder    dependent  . Transient global amnesia   . Tuberculosis   . Type II diabetes mellitus ([place])     Past Surgical History:  Procedure Laterality Date  . A FLUTTER ABLATION N/A 12/28/2011   Procedure: ABLATION A FLUTTER;  Surgeon: Evans Lance, MD;  Location: [facility] CATH LAB;  Service: Cardiovascular;  Laterality: N/A;  . CARDIAC CATHETERIZATION N/A 07/10/2015   Procedure: Left Heart Cath and Coronary Angiography;  Surgeon: Sherren Mocha, MD;  Location: [place] CV LAB;  Service: Cardiovascular;  Laterality: N/A;  . CARDIAC ELECTROPHYSIOLOGY MAPPING AND ABLATION  03/2010  . CARDIOVERSION N/A 07/13/2016   Procedure: CARDIOVERSION;  Surgeon: Skeet Latch, MD;  Location: [place];  Service: Cardiovascular;  Laterality: N/A;  . COLONOSCOPY    . colonoscopy with polypectomy  2006 & 2011   Dr [name]  . CYSTOSCOPY  11/2007   Dr Jeffie Pollock  . IR FLUORO  GUIDE PORT INSERTION RIGHT  02/18/2018  . IR GUIDED DRAIN W CATHETER PLACEMENT  01/26/2018  . IR US GUIDE BX ASP/DRAIN  01/26/2018  . IR US GUIDE VASC ACCESS RIGHT  02/18/2018  . PARTIAL HIP ARTHROPLASTY Right 01/2000   "Right; replaced ball & stem"  . PARTIAL HIP ARTHROPLASTY Left   . POLYPECTOMY    . TEE WITHOUT CARDIOVERSION  12/28/2011   Procedure: TRANSESOPHAGEAL ECHOCARDIOGRAM (TEE);  Surgeon: Larey Dresser, MD;  Location: [facility] ENDOSCOPY;  Service: Cardiovascular;  Laterality: N/A;    Family History  Problem Relation Age of Onset  . Stroke Mother 9  . Hypertension Mother   . Diabetes Mother   . Diabetes Paternal Grandmother   . Lung cancer Father        smoker  . Diabetes Brother   . Heart attack Brother 68  . Diabetes Brother   . Hepatitis C Brother   . Throat cancer Paternal Uncle  1/2 uncle  . Heart attack Brother 32  . Crohn's disease Son   . Colon cancer Neg Hx   . Esophageal cancer Neg Hx   . Rectal cancer Neg Hx   . Stomach cancer Neg Hx     Social History   Socioeconomic History  . Marital status: Widowed    Spouse name: Not on file  . Number of children: 5  . Years of education: Not on file  . Highest education level: Not on file  Occupational History  . Occupation: Presenter, broadcasting  Social Needs  . Financial resource strain: Not on file  . Food insecurity:    Worry: Not on file    Inability: Not on file  . Transportation needs:    Medical: Not on file    Non-medical: Not on file  Tobacco Use  . Smoking status: Current Every Day Smoker    Packs/day: 1.00    Years: 44.00    Pack years: 44.00    Types: Cigarettes  . Smokeless tobacco: Never Used  . Tobacco comment: started @ age 60, up to 2 ppd;1.25-1.5 ppd as of 11/23/13  Substance and Sexual Activity  . Alcohol use: No    Alcohol/week: 0.0 oz    Comment:  11/23/13 "2-3 drinks per year"  . Drug use: No  . Sexual activity: Not Currently  Lifestyle  . Physical activity:    Days per week: Not on  file    Minutes per session: Not on file  . Stress: Not on file  Relationships  . Social connections:    Talks on phone: Not on file    Gets together: Not on file    Attends religious service: Not on file    Active member of club or organization: Not on file    Attends meetings of clubs or organizations: Not on file    Relationship status: Not on file  . Intimate partner violence:    Fear of current or ex partner: Not on file    Emotionally abused: Not on file    Physically abused: Not on file    Forced sexual activity: Not on file  Other Topics Concern  . Not on file  Social History Narrative  . Not on file    Past Medical History, Surgical history, Social history, and Family history were reviewed and updated as appropriate.   Please see review of systems for further details on the patient's review from today.   Review of Systems:  Review of Systems  Constitutional: Positive for chills and fever. Negative for diaphoresis.  HENT: Negative for congestion, postnasal drip, rhinorrhea, sinus pressure, sinus pain, sneezing and sore throat.   Respiratory: Positive for cough and shortness of breath. Negative for choking, chest tightness and wheezing.   Cardiovascular: Positive for leg swelling. Negative for chest pain and palpitations.  Neurological: Negative for headaches.    Objective:   Physical Exam:  BP 116/79 (BP Location: Left Arm, Patient Position: Sitting)   Pulse (!) 150   Temp 99.1 F (37.3 C) (Oral)   Resp 18   SpO2 (!) 88% Comment: 10 L NRB mask ECOG: 3  Physical Exam  Constitutional: He appears distressed (The patient is an adult male who is receiving O2 via nasal cannula and appears to be short of breath with tachypnea.).  The patient is an adult male who appears to be chronically ill and is receiving O2 via nasal cannula.  HENT:  Head: Normocephalic and atraumatic.  Right Ear: External  ear normal.  Left Ear: External ear normal.  Neck: Normal range of  motion. Neck supple.  Cardiovascular: S1 normal and S2 normal. An irregularly irregular rhythm present. Tachycardia present. Exam reveals no gallop and no friction rub.  No murmur heard. Pulmonary/Chest: Accessory muscle usage present. Tachypnea noted. He has decreased breath sounds in the right upper field, the right middle field, the right lower field, the left upper field, the left middle field and the left lower field. He has rales in the left lower field.  Lymphadenopathy:    He has no cervical adenopathy.  Neurological: He is alert. Coordination normal.  Skin: Skin is warm and dry. No rash noted. He is not diaphoretic. No erythema.  Psychiatric: He has a normal mood and affect. His behavior is normal. Judgment and thought content normal.    Lab Review:     Component Value Date/Time   NA 139 02/25/2018 0510   NA 141 10/21/2017 1203   NA 140 07/07/2017 1015   K 3.3 (L) 02/25/2018 0510   K 4.1 07/07/2017 1015   CL 100 (L) 02/25/2018 0510   CO2 28 02/25/2018 0510   CO2 26 07/07/2017 1015   GLUCOSE 113 (H) 02/25/2018 0510   GLUCOSE 103 07/07/2017 1015   BUN 10 02/25/2018 0510   BUN 17 10/21/2017 1203   BUN 18.8 07/07/2017 1015   CREATININE 0.95 02/25/2018 0510   CREATININE 1.15 02/23/2018 1107   CREATININE 1.5 (H) 07/07/2017 1015   CALCIUM 7.7 (L) 02/25/2018 0510   CALCIUM 9.5 07/07/2017 1015   PROT 6.6 03/09/2018 1322   PROT 7.7 07/07/2017 1015   ALBUMIN 2.7 (L) 02/23/2018 1322   ALBUMIN 3.6 07/07/2017 1015   AST 29 03/11/2018 1322   AST 26 03/18/2018 1107   AST 15 07/07/2017 1015   ALT 36 03/14/2018 1322   ALT 31 03/18/2018 1107   ALT 15 07/07/2017 1015   ALKPHOS 61 03/06/2018 1322   ALKPHOS 55 07/07/2017 1015   BILITOT 0.6 03/15/2018 1322   BILITOT 0.6 03/04/2018 1107   BILITOT 0.51 07/07/2017 1015   GFRNONAA >60 02/25/2018 0510   GFRNONAA >60 03/01/2018 1107   GFRAA >60 02/25/2018 0510   GFRAA >60 03/17/2018 1107       Component Value Date/Time   WBC 3.1  (L) 02/25/2018 0510   RBC 3.56 (L) 02/25/2018 0510   HGB 10.7 (L) 02/25/2018 0510   HGB 11.7 (L) 03/19/2018 1107   HGB 17.1 10/13/2017 1202   HGB 16.2 07/07/2017 1015   HCT 32.7 (L) 02/25/2018 0510   HCT 49.0 10/13/2017 1202   HCT 49.6 07/07/2017 1015   PLT 86 (L) 02/25/2018 0510   PLT 91 (L) 03/20/2018 1107   PLT 150 10/13/2017 1202   MCV 91.9 02/25/2018 0510   MCV 91 10/13/2017 1202   MCV 93.6 07/07/2017 1015   MCH 30.1 02/25/2018 0510   MCHC 32.7 02/25/2018 0510   RDW 14.4 02/25/2018 0510   RDW 16.4 (H) 10/13/2017 1202   RDW 14.7 (H) 07/07/2017 1015   LYMPHSABS 0.8 03/11/2018 1322   LYMPHSABS 1.7 07/07/2017 1015   MONOABS 0.4 03/14/2018 1322   MONOABS 0.9 07/07/2017 1015   EOSABS 0.1 03/08/2018 1322   EOSABS 0.2 07/07/2017 1015   BASOSABS 0.0 02/23/2018 1322   BASOSABS 0.0 07/07/2017 1015   -------------------------------  Imaging from last 24 hours (if applicable):  Radiology interpretation: Dg Chest 2 View  Result Date: 02/26/2018 CLINICAL DATA:  Shortness of breath, fever.  History of lung  cancer. EXAM: CHEST - 2 VIEW COMPARISON:  Radiographs of Jan 24, 2018. FINDINGS: Stable cardiomediastinal silhouette. Interval placement of right internal jugular Port-A-Cath with distal tip in expected position of cavoatrial junction. No pneumothorax is noted. Increased left basilar opacity is noted concerning for worsening edema or possibly pneumonia. Stable right lung opacities are noted concerning for pneumonia or atelectasis with probable loculated pleural effusion. Interval placement of right pleural drain is noted. Bony thorax is unremarkable. IMPRESSION: Worsening left basilar edema or pneumonia. Stable right lung opacities are noted concerning for pneumonia or atelectasis with probable loculated right pleural effusion. Interval placement of right internal jugular Port-A-Cath. Interval placement of right-sided pleural drainage catheter. Electronically Signed   By: Marijo Conception,  M.D.   On: 02/19/2018 12:11   Ir Guided Niel Hummer W Catheter Placement  Result Date: 01/26/2018 INDICATION: 67 year old male with metastatic squamous cell carcinoma and a recurrent symptomatic malignant right pleural effusion. EXAM: Tunneled right pleural drainage catheter placement COMPARISON:  None. MEDICATIONS: The patient is currently admitted to the hospital and receiving intravenous antibiotics. The antibiotics were administered within an appropriate time frame prior to the initiation of the procedure. ANESTHESIA/SEDATION: Fentanyl 50 mcg IV; Versed 1 mg IV Moderate Sedation Time:  20 minutes The patient was continuously monitored during the procedure by the interventional radiology nurse under my direct supervision. COMPLICATIONS: None immediate. TECHNIQUE: Informed written consent was obtained from the patient after a thorough discussion of the procedural risks, benefits and alternatives. All questions were addressed. Maximal Sterile Barrier Technique was utilized including caps, mask, sterile gowns, sterile gloves, sterile drape, hand hygiene and skin antiseptic. A timeout was performed prior to the initiation of the procedure. The right chest was interrogated with ultrasound. The largest fluid pocket was identified. Local anesthesia was attained by infiltration with 1% lidocaine. A small dermatotomy was made. Under real-time sonographic guidance, the fluid pocket was accessed with an 18 gauge sheath needle. The sheath was left in the pleural space and the needle was removed. A short Amplatz wire was advanced into the pleural space. A skin exit site was selected 5 cm superior and medial to the pleural entry site. Local anesthesia was again attained by infiltration with 1% lidocaine. A small dermatotomy was made. The tunneled PleurX drainage catheter was then tunneled from the skin exit site to the dermatotomy overlying the pleural access site. The peel-away sheath was then advanced over the wire and into the  pleural space. The dilator and wire were removed. The PleurX catheter was advanced through the peel-away sheath and the peel-away sheath was discarded. The PleurX catheter was connected to vacuum tender with evacuation of 1700 mL pleural fluid. The drainage catheter was secured to the skin with 0 Prolene suture. The dermatotomy overlying the pleural access site was closed with a single inverted interrupted 3-0 Vicryl suture in the epidermis was sealed with Dermabond. The patient tolerated the procedure well. PROCEDURE: As above. IMPRESSION: Successful placement of a right-sided tunneled pleural PleurX drainage catheter. Drainage at the time of placement yields 1.7 L pleural fluid. Signed, Criselda Peaches, MD [facility] Electronically Signed   By: Jacqulynn Cadet M.D.   On: 01/26/2018 17:16   Ir US Guide Vasc Access Right  Result Date: 02/18/2018 CLINICAL DATA:  Metastatic right lung carcinoma. Needs durable venous access for chemotherapy regimen. EXAM: TUNNELED PORT CATHETER PLACEMENT WITH ULTRASOUND AND FLUOROSCOPIC GUIDANCE FLUOROSCOPY TIME:  0.1 minutes; 104 uGym2 DAP ANESTHESIA/SEDATION: Intravenous Fentanyl and Versed were  administered as conscious sedation during continuous monitoring of the patient's level of consciousness and physiological / cardiorespiratory status by the radiology RN, with a total moderate sedation time of 17 minutes. TECHNIQUE: The procedure, risks, benefits, and alternatives were explained to the patient. Questions regarding the procedure were encouraged and answered. The patient understands and consents to the procedure. As antibiotic prophylaxis, cefazolin 3 g was ordered pre-procedure and administered intravenously within one hour of incision. Patency of the right IJ vein was confirmed with ultrasound with image documentation. An appropriate skin site was determined. Skin site was marked. Region was prepped using  maximum barrier technique including cap and mask, sterile gown, sterile gloves, large sterile sheet, and Chlorhexidine as cutaneous antisepsis. The region was infiltrated locally with 1% lidocaine. Under real-time ultrasound guidance, the right IJ vein was accessed with a 21 gauge micropuncture needle; the needle tip within the vein was confirmed with ultrasound image documentation. Needle was exchanged over a 018 guidewire for transitional dilator which allowed passage of the [facility] wire into the IVC. Over this, the transitional dilator was exchanged for a 5 [place] MPA catheter. A small incision was made on the right anterior chest wall and a subcutaneous pocket fashioned. The power-injectable port was positioned and its catheter tunneled to the right IJ dermatotomy site. The MPA catheter was exchanged over an Amplatz wire for a peel-away sheath, through which the port catheter, which had been trimmed to the appropriate length, was advanced and positioned under fluoroscopy with its tip at the cavoatrial junction. Spot chest radiograph confirms good catheter position and no pneumothorax. The pocket was closed with deep interrupted and subcuticular continuous 3-0 Monocryl sutures. The port was flushed per protocol. The incisions were covered with Dermabond then covered with a sterile dressing. COMPLICATIONS: COMPLICATIONS None immediate IMPRESSION: Technically successful right IJ power-injectable port catheter placement. Ready for routine use. Electronically Signed   By: Lucrezia Europe M.D.   On: 02/18/2018 13:40   Ir US Guide Bx Asp/drain  Result Date: 01/26/2018 INDICATION: 67 year old male with metastatic squamous cell carcinoma and a recurrent symptomatic malignant right pleural effusion. EXAM: Tunneled right pleural drainage catheter placement COMPARISON:  None. MEDICATIONS: The patient is currently admitted to the hospital and receiving intravenous antibiotics. The antibiotics were administered within an  appropriate time frame prior to the initiation of the procedure. ANESTHESIA/SEDATION: Fentanyl 50 mcg IV; Versed 1 mg IV Moderate Sedation Time:  20 minutes The patient was continuously monitored during the procedure by the interventional radiology nurse under my direct supervision. COMPLICATIONS: None immediate. TECHNIQUE: Informed written consent was obtained from the patient after a thorough discussion of the procedural risks, benefits and alternatives. All questions were addressed. Maximal Sterile Barrier Technique was utilized including caps, mask, sterile gowns, sterile gloves, sterile drape, hand hygiene and skin antiseptic. A timeout was performed prior to the initiation of the procedure. The right chest was interrogated with ultrasound. The largest fluid pocket was identified. Local anesthesia was attained by infiltration with 1% lidocaine. A small dermatotomy was made. Under real-time sonographic guidance, the fluid pocket was accessed with an 18 gauge sheath needle. The sheath was left in the pleural space and the needle was removed. A short Amplatz wire was advanced into the pleural space. A skin exit site was selected 5 cm superior and medial to the pleural entry site. Local anesthesia was again attained by infiltration with 1% lidocaine. A small dermatotomy was made. The tunneled PleurX drainage catheter was then tunneled from the skin exit site  to the dermatotomy overlying the pleural access site. The peel-away sheath was then advanced over the wire and into the pleural space. The dilator and wire were removed. The PleurX catheter was advanced through the peel-away sheath and the peel-away sheath was discarded. The PleurX catheter was connected to vacuum tender with evacuation of 1700 mL pleural fluid. The drainage catheter was secured to the skin with 0 Prolene suture. The dermatotomy overlying the pleural access site was closed with a single inverted interrupted 3-0 Vicryl suture in the epidermis  was sealed with Dermabond. The patient tolerated the procedure well. PROCEDURE: As above. IMPRESSION: Successful placement of a right-sided tunneled pleural PleurX drainage catheter. Drainage at the time of placement yields 1.7 L pleural fluid. Signed, Criselda Peaches, MD [facility] Electronically Signed   By: Jacqulynn Cadet M.D.   On: 01/26/2018 17:16   Dg Chest Port 1 View  Result Date: 02/25/2018 CLINICAL DATA:  Acute onset of shortness of breath. Follow-up pneumonia. EXAM: PORTABLE CHEST 1 VIEW COMPARISON:  Chest radiograph performed 02/23/2018 FINDINGS: Pulmonary fibrotic change and postradiation change are again noted. A small right pleural effusion is noted. Increased left basilar airspace opacification raises concern for superimposed pneumonia. No pneumothorax is seen. The cardiomediastinal silhouette is normal in size. A right-sided chest port is noted ending about the distal SVC. No acute osseous abnormalities are identified. IMPRESSION: Increased left basilar airspace opacification raises concern for superimposed pneumonia. Small right pleural effusion noted. Pulmonary fibrotic change and postradiation change again noted. Electronically Signed   By: Garald Balding M.D.   On: 02/25/2018 04:51   Ir Fluoro Guide Port Insertion Right  Result Date: 02/18/2018 CLINICAL DATA:  Metastatic right lung carcinoma. Needs durable venous access for chemotherapy regimen. EXAM: TUNNELED PORT CATHETER PLACEMENT WITH ULTRASOUND AND FLUOROSCOPIC GUIDANCE FLUOROSCOPY TIME:  0.1 minutes; 104 uGym2 DAP ANESTHESIA/SEDATION: Intravenous Fentanyl and Versed were administered as conscious sedation during continuous monitoring of the patient's level of consciousness and physiological / cardiorespiratory status by the radiology RN, with a total moderate sedation time of 17 minutes. TECHNIQUE: The procedure, risks, benefits, and alternatives were explained to  the patient. Questions regarding the procedure were encouraged and answered. The patient understands and consents to the procedure. As antibiotic prophylaxis, cefazolin 3 g was ordered pre-procedure and administered intravenously within one hour of incision. Patency of the right IJ vein was confirmed with ultrasound with image documentation. An appropriate skin site was determined. Skin site was marked. Region was prepped using maximum barrier technique including cap and mask, sterile gown, sterile gloves, large sterile sheet, and Chlorhexidine as cutaneous antisepsis. The region was infiltrated locally with 1% lidocaine. Under real-time ultrasound guidance, the right IJ vein was accessed with a 21 gauge micropuncture needle; the needle tip within the vein was confirmed with ultrasound image documentation. Needle was exchanged over a 018 guidewire for transitional dilator which allowed passage of the [facility] wire into the IVC. Over this, the transitional dilator was exchanged for a 5 [place] MPA catheter. A small incision was made on the right anterior chest wall and a subcutaneous pocket fashioned. The power-injectable port was positioned and its catheter tunneled to the right IJ dermatotomy site. The MPA catheter was exchanged over an Amplatz wire for a peel-away sheath, through which the port catheter, which had been trimmed to the appropriate length, was advanced and positioned under fluoroscopy with its tip at the cavoatrial junction. Spot chest radiograph confirms good catheter position and no pneumothorax.  The pocket was closed with deep interrupted and subcuticular continuous 3-0 Monocryl sutures. The port was flushed per protocol. The incisions were covered with Dermabond then covered with a sterile dressing. COMPLICATIONS: COMPLICATIONS None immediate IMPRESSION: Technically successful right IJ power-injectable port catheter placement. Ready for routine use. Electronically Signed   By: Lucrezia Europe M.D.   On:  02/18/2018 13:40        This case was discussed with Dr. Julien Nordmann. He expressed agreement with my management of this patient.

## 2018-02-25 NOTE — Progress Notes (Signed)
Pts. HR continues to be in the 135-160 range. BP -126/71. Informed MD. PRN orders for IV Lopressor 5 mg q 4 hrs given.   Will continue to monitor.

## 2018-02-25 NOTE — Progress Notes (Signed)
[facility] Progress Note Patient Name: Silvester Reierson DOB: 1951/07/17 MRN: [id number]   Date of Service  02/25/2018  HPI/Events of Note  Persistent RVR despite amiodarone infusion for 3.5 hours so far. BP somewhat better.  eICU Interventions  Metoprolol 2.5 mg iv x 1 now, will repeat the dose if HR remains > 110 and SBP > 110 mmHg        Amire Leazer U Aviella Disbrow 02/25/2018, 2:52 AM

## 2018-02-25 NOTE — Progress Notes (Signed)
Pharmacy - Brief Note (Vancomycin dosing)  Scr improved from admission MRSA PCR neg BCx pending  Antimicrobials this admission: cefepime 6/6 >>  vancomycin 6/6 >>    Dose adjustments this admission: 6/7 Adj vanco to 1gm VI q12h for Improved renal function   Microbiology results: 6/6 BCx: Sent 6/6 UCx: Sent  6/6 MRSA PCR: neg  6/7 strep ag:   Plan:  Adjust vancomycin to 1gm IV q12h per current renal function  MRSA PCR negative, suggest stopping vancomycin as clinically indicated  Doreene Eland, PharmD, BCPS.   Pager: [phone] 02/25/2018 10:27 AM

## 2018-02-25 NOTE — Progress Notes (Signed)
Pts HR from 130's-160. PO Cardizem given early. MD notified. Instructed to monitor at this time.

## 2018-02-25 NOTE — Progress Notes (Signed)
CRITICAL VALUE ALERT  Critical Value:  Magnesium 0.8  Date & Time Notied:  02/25/18 7209  Provider Notified: Yes  Orders Received/Actions taken: Awaiting orders.

## 2018-02-25 NOTE — Progress Notes (Addendum)
PULMONARY / CRITICAL CARE MEDICINE   Name: Neil VIEAU Sr. MRN: [id number] DOB: Aug 15, 1951    ADMISSION DATE:  03/13/2018 CONSULTATION DATE:  02/25/18  REFERRING MD:  Dr. Darl Householder / EDP   CHIEF COMPLAINT:  Hypotension / Hypoxia   BRIEF SUMMARY:  67 y/o M who presented from the infusion center with reports of "not looking well".  He reported 2 week hx of progressive SOB & productive cough. No change in BLE edema.  He carries a history of recurrent and metastatic non-small cell lung cancer (presented in 02/2017 with pleural based mass), adenocarcinoma initially diagnosed as stage IB (T2a, N0, M0) non-small cell lung cancer, squamous cell carcinoma.  He had pleural effusion in 12/2017 with adenocarcinoma in the right pleural fluid s/p PleurX.  In the ER, he was found to be hypotensive & hypoxic requiring a non rebreather.  A code sepsis was initiated and he received 4L of IVF and broad spectrum abx with improvement.     SUBJECTIVE:  Tmax 102.  Positive balance 472.  Remains on HFNC 50% / 25L flow. Pt reports his nose is very dry/stuffy.  Remains on IVF / amiodarone & receiving lasix.    VITAL SIGNS: BP (!) 105/59   Pulse (!) 144   Temp 98.8 F (37.1 C) (Oral)   Resp (!) 21   Ht 5\' 11"  (1.803 m)   Wt 274 lb 4 oz (124.4 kg)   SpO2 95%   BMI 38.25 kg/m   HEMODYNAMICS:    VENTILATOR SETTINGS: FiO2 (%):  [80 %] 80 %  INTAKE / OUTPUT: I/O last 3 completed shifts: In: 2762 [I.V.:1462; IV Piggyback:1300] Out: 2800 [Urine:2800]  PHYSICAL EXAMINATION: General:  Adult male in NAD, sitting up in chair  Neuro:  AAOx4, speech clear, MAE  HEENT:  MM pink/moist, no jvd  Cardiovascular:  s1s2 irr irr, AF 110's-130's on monitor   Lungs: mild dyspnea with conversation Abdomen:  Obese/soft, bsx4 active  Musculoskeletal:  No acute deformities  Skin:  Warm/dry, BLE's wrapped with una-boot  LABS:  BMET Recent Labs  Lab 02/19/2018 1107 03/20/2018 1322 02/25/18 0510  NA 137 139 139  K 3.8  3.9 3.3*  CL 98 98* 100*  CO2 26 30 28   BUN 16 16 10   CREATININE 1.15 1.19 0.95  GLUCOSE 145* 110* 113*    Electrolytes Recent Labs  Lab 03/08/2018 1107 02/23/2018 1322 02/25/18 0510  CALCIUM 8.8 8.2* 7.7*  MG  --   --  0.8*    CBC Recent Labs  Lab 02/23/2018 1107 03/14/2018 1322 02/25/18 0510  WBC 3.6* 3.1* 3.1*  HGB 11.7* 11.5* 10.7*  HCT 36.3* 35.1* 32.7*  PLT 91* 100* 86*    Coag's Recent Labs  Lab 02/18/18 1000  INR 1.36    Sepsis Markers Recent Labs  Lab 02/21/2018 1520 03/13/2018 1835 02/23/2018 2022  LATICACIDVEN 0.93 0.9 0.9    ABG No results for input(s): PHART, PCO2ART, PO2ART in the last 168 hours.  Liver Enzymes Recent Labs  Lab 02/23/2018 1107 03/14/2018 1322  AST 26 29  ALT 31 36  ALKPHOS 74 61  BILITOT 0.6 0.6  ALBUMIN 2.6* 2.7*    Cardiac Enzymes No results for input(s): TROPONINI, PROBNP in the last 168 hours.  Glucose Recent Labs  Lab 02/18/18 0938  GLUCAP 121*    Imaging Dg Chest 2 View  Result Date: 03/02/2018 CLINICAL DATA:  Shortness of breath, fever.  History of lung cancer. EXAM: CHEST - 2 VIEW COMPARISON:  Radiographs of  Jan 24, 2018. FINDINGS: Stable cardiomediastinal silhouette. Interval placement of right internal jugular Port-A-Cath with distal tip in expected position of cavoatrial junction. No pneumothorax is noted. Increased left basilar opacity is noted concerning for worsening edema or possibly pneumonia. Stable right lung opacities are noted concerning for pneumonia or atelectasis with probable loculated pleural effusion. Interval placement of right pleural drain is noted. Bony thorax is unremarkable. IMPRESSION: Worsening left basilar edema or pneumonia. Stable right lung opacities are noted concerning for pneumonia or atelectasis with probable loculated right pleural effusion. Interval placement of right internal jugular Port-A-Cath. Interval placement of right-sided pleural drainage catheter. Electronically Signed   By:  Marijo Conception, M.D.   On: 02/25/2018 12:11   Dg Chest Port 1 View  Result Date: 02/25/2018 CLINICAL DATA:  Acute onset of shortness of breath. Follow-up pneumonia. EXAM: PORTABLE CHEST 1 VIEW COMPARISON:  Chest radiograph performed 03/02/2018 FINDINGS: Pulmonary fibrotic change and postradiation change are again noted. A small right pleural effusion is noted. Increased left basilar airspace opacification raises concern for superimposed pneumonia. No pneumothorax is seen. The cardiomediastinal silhouette is normal in size. A right-sided chest port is noted ending about the distal SVC. No acute osseous abnormalities are identified. IMPRESSION: Increased left basilar airspace opacification raises concern for superimposed pneumonia. Small right pleural effusion noted. Pulmonary fibrotic change and postradiation change again noted. Electronically Signed   By: Garald Balding M.D.   On: 02/25/2018 04:51     STUDIES:   CULTURES: BCx2 6/6 >>  UC 6/6 >>  U. Strep Antigen 6/7 >>   ANTIBIOTICS: Vanco 6/5 >>  Cefepime 6/5 >>   SIGNIFICANT EVENTS: 6/05  Admit from cancer center with concern for SIRS / Sepsis   LINES/TUBES: R Port >>   DISCUSSION: 67 y/o M with metastatic NSCLC admitted from infusion center with weakness, hypoxia and hypotension.  Concern for sepsis in setting of suspected LLL PNA.  Responded well to volume.  Remains on high flow O2.    ASSESSMENT / PLAN:  PULMONARY A: Acute Hypoxic Respiratory Failure  LLL PNA (NOS) Metastatic NSCLC P:   Wean O2 for sats 90-95% Continue mucinex  Pulmonary hygiene - IS / flutter, mobilize  Brovana + Pulmicort BID, Atrovent Q6 Follow intermittent CXR Add nasal hygiene - Q6 nasal saline + flonase BID  CARDIOVASCULAR A:  Sepsis  Hypotension - resolved with IVF AF - on Xarelto HLD P:  ICU monitoring  Amiodarone gtt, wean to off  Resume home Cartia, Crestor Hold home cozaar  Continue home metoprolol 50 mg PO BID  Lasix 80 mg BID   Continue xarelto  Decrease IVF to 10 ml/hr  RENAL A:   Hypokalemia Hypomagnesemia  P:   Trend BMP / urinary output Follow up magnesium in am  Replace electrolytes as indicated Avoid nephrotoxic agents, ensure adequate renal perfusion  GASTROINTESTINAL A:   At Risk Malnutrition - in setting of chronic illness  P:   Advance diet as tolerated  Bowel regimen > senokot  HEMATOLOGIC A:   Pancytopenia  P:  Trend CBC  Monitor for bleeding  SCD's for DVT prophylaxis  Continue folic acid   INFECTIOUS A:   Sepsis - LLL PNA P:   Follow cultures as above  ABX as ordered  Trend fever curve / WBC Assess strep urine antigen   ENDOCRINE A:   DM    P:   Continue lantus QHS Carb modified diet  CBG ACHS Hold home metformin  NEUROLOGIC A:   Anxiety  P:   RASS goal: n/a PT efforts as able  PRN ativan for anxiety    FAMILY  - Updates: Patient updated on plan of care  - Inter-disciplinary family meet or Palliative Care meeting due by:  6/13  - Global:  To TRH as of 6/8.  PCCM will remain as pulmonary consult.   Noe Gens, NP-C [facility] Pgr: [phone] or if no answer [phone] 02/25/2018, 8:12 AM

## 2018-02-25 NOTE — Progress Notes (Signed)
Patient refuses to stay in bed and demands to sit in the chair. RN compromised with patient and placed the bed in chair position. Patient still not satisfied and tries to get out of bed. Patient now placed in chair to sleep. Patient educated on safety. In the case of an emergency or cardiac arrest, being in a chair will prolong care and it would be difficult to place patient back in the bed. Patient also educated on fall risks. Chair alarm on. Will continue to monitor patient.

## 2018-02-26 ENCOUNTER — Inpatient Hospital Stay (HOSPITAL_COMMUNITY): Payer: Medicare Other

## 2018-02-26 DIAGNOSIS — D61818 Other pancytopenia: Secondary | ICD-10-CM

## 2018-02-26 DIAGNOSIS — A419 Sepsis, unspecified organism: Principal | ICD-10-CM

## 2018-02-26 DIAGNOSIS — I4891 Unspecified atrial fibrillation: Secondary | ICD-10-CM

## 2018-02-26 DIAGNOSIS — C3490 Malignant neoplasm of unspecified part of unspecified bronchus or lung: Secondary | ICD-10-CM

## 2018-02-26 DIAGNOSIS — J189 Pneumonia, unspecified organism: Secondary | ICD-10-CM

## 2018-02-26 DIAGNOSIS — J9621 Acute and chronic respiratory failure with hypoxia: Secondary | ICD-10-CM

## 2018-02-26 LAB — CBC
HCT: 31 % — ABNORMAL LOW (ref 39.0–52.0)
HEMOGLOBIN: 10.2 g/dL — AB (ref 13.0–17.0)
MCH: 30.1 pg (ref 26.0–34.0)
MCHC: 32.9 g/dL (ref 30.0–36.0)
MCV: 91.4 fL (ref 78.0–100.0)
Platelets: 52 10*3/uL — ABNORMAL LOW (ref 150–400)
RBC: 3.39 MIL/uL — ABNORMAL LOW (ref 4.22–5.81)
RDW: 14.4 % (ref 11.5–15.5)
WBC: 3.4 10*3/uL — ABNORMAL LOW (ref 4.0–10.5)

## 2018-02-26 LAB — PROCALCITONIN: Procalcitonin: <0.1 ng/mL

## 2018-02-26 LAB — BASIC METABOLIC PANEL
Anion gap: 9 (ref 5–15)
BUN: 10 mg/dL (ref 6–20)
CALCIUM: 7.6 mg/dL — AB (ref 8.9–10.3)
CO2: 29 mmol/L (ref 22–32)
Chloride: 100 mmol/L — ABNORMAL LOW (ref 101–111)
Creatinine, Ser: 1 mg/dL (ref 0.61–1.24)
GFR calc Af Amer: >60 mL/min (ref >60)
GLUCOSE: 130 mg/dL — AB (ref 65–99)
Potassium: 3.3 mmol/L — ABNORMAL LOW (ref 3.5–5.1)
Sodium: 138 mmol/L (ref 135–145)

## 2018-02-26 LAB — PHOSPHORUS: PHOSPHORUS: 3 mg/dL (ref 2.5–4.6)

## 2018-02-26 LAB — MAGNESIUM: Magnesium: 1.8 mg/dL (ref 1.7–2.4)

## 2018-02-26 MED ORDER — INSULIN ASPART 100 UNIT/ML ~~LOC~~ SOLN: 0.0000 [IU] | Freq: Every day | SUBCUTANEOUS | Status: DC

## 2018-02-26 MED ORDER — MORPHINE SULFATE (PF) 2 MG/ML IV SOLN
1.0000 mg | INTRAVENOUS | Status: DC | PRN
  Administered 2018-02-26 – 2018-02-27 (×2): 2 mg via INTRAVENOUS
  Filled 2018-02-26 (×2): qty 1

## 2018-02-26 MED ORDER — INSULIN ASPART 100 UNIT/ML ~~LOC~~ SOLN
0.0000 [IU] | Freq: Three times a day (TID) | SUBCUTANEOUS | Status: DC
  Administered 2018-02-26: 4 [IU] via SUBCUTANEOUS
  Administered 2018-02-27: 3 [IU] via SUBCUTANEOUS

## 2018-02-26 MED ORDER — POTASSIUM CHLORIDE CRYS ER 20 MEQ PO TBCR
40.0000 meq | EXTENDED_RELEASE_TABLET | ORAL | Status: AC
  Administered 2018-02-26 (×2): 40 meq via ORAL
  Filled 2018-02-26 (×2): qty 2

## 2018-02-26 MED ORDER — SODIUM CHLORIDE 0.9% FLUSH: 10.0000 mL | INTRAVENOUS | Status: DC | PRN

## 2018-02-26 MED ORDER — CHLORHEXIDINE GLUCONATE CLOTH 2 % EX PADS
6.0000 | MEDICATED_PAD | Freq: Every day | CUTANEOUS | Status: DC
  Administered 2018-02-26 – 2018-02-27 (×2): 6 via TOPICAL

## 2018-02-26 MED ORDER — SODIUM CHLORIDE 0.9% FLUSH
10.0000 mL | Freq: Two times a day (BID) | INTRAVENOUS | Status: DC
  Administered 2018-02-26: 10 mL

## 2018-02-26 NOTE — Progress Notes (Addendum)
Palliative care consult received  Case discussed with [name] and he has been having [place] conversations with Mr. Ruppe.    After talking with Dr Elsworth Soho, will hold on formal palliative consult at this time, but we remain available to help in whatever capacity in the care of Mr. Raczka.  Please reconsult if we can be of further assistance moving forward.  Micheline Rough, MD [facility] Team [phone]  NO CHARGE NOTE

## 2018-02-26 NOTE — Progress Notes (Signed)
67 year old heavy smoker with chronic diastolic heart failure, COPD and ILD.  He has chronic atrial fibrillation.  He is maintained on Enbrel for psoriasis.  He underwent SBRT to right upper lobe squamous cell lung cancer in 02/2017.  Unfortunately he had a pleural recurrence has a malignant effusion and had a Pleurx catheter placed.  He is now undergoing systemic chemotherapy. He  also has OSA but refuses to use CPAP  He was admitted 6/6 with progressive shortness of breath for 2 weeks, hypotensive and hypoxic requiring a nonrebreather & increasing left-sided airspace disease and right effusion.    He remains critically ill, on high flow oxygen at 100% with 30 L, and atrial fibrillation with RVR, he is out of bed to chair and gets back in full sentences although use of accessory muscles and gets dyspneic, mild JVD, 2+ bipedal edema, bilateral one half crackles and no rhonchi show  Chest x-ray personally reviewed and shows unchanged left-sided airspace disease and right effusion. Labs show hypokalemia and low procalcitonin and pancytopenia.  Impression/plan  Acute on chronic respiratory failure -with extremely low procalcitonin HCAP seems less likely and this may be related to lymphangitic spread of cancer  -Continue high flow oxygen -Continue broad-spectrum antibiotics  ILD -can stop ofev given poor overall prognosis  Chronic basilar heart failure-continue Lasix 40 every 12, replete potassium as needed.  Atrial fibrillation/RVR-continue Cardizem 60 every 6, discontinue amiodarone  Detailed discussion with patient in presence of his family, brother and sister-in-law.  Have also discussed with his daughter.  The only reversible part of his illness would be pneumonia and this seems less likely with a low procalcitonin.  I strongly emphasized against life support because he would never come off mechanical ventilation, CPR in this situation would be futile.  He finally agreed and we will pass a DO  NOT RESUSCITATE order. Who remains to be seen and is without his oxygen requirements will decrease enough for [name] to send him home with hospice, otherwise we may have to send him to facility.  My critical care time x 66m  Kara Mead MD. FCCP. [facility] Pager [phone] If no response call [phone]   02/26/2018

## 2018-02-26 NOTE — Evaluation (Signed)
Clinical/Bedside Swallow Evaluation Patient Details  Name: Neil SALAY Sr. MRN: [id number] Date of Birth: 03/24/1951  Today's Date: 02/26/2018 Time: SLP Start Time (ACUTE ONLY): 1508 SLP Stop Time (ACUTE ONLY): 1523 SLP Time Calculation (min) (ACUTE ONLY): 15 min  Past Medical History:  Past Medical History:  Diagnosis Date  . Adenomatous colon polyp   . Aortic insufficiency    Echo 3/18: Severe concentric LVH, EF 60-65, normal wall motion, moderate AI, mild LAE, mild TR // Echo 9/18: Mild concentric LVH, EF 60-65, normal wall motion, trivial AI, MAC, mild BAE  . Arthritis    left hip replacement  . Atrial flutter ([place])    onset  2011. s/p EPS/RFA 12/2011  . Cataract   . Chronic bronchitis   . Contact dermatitis and other eczema due to plants (except food)   . COPD (chronic obstructive pulmonary disease) ([place])   . GERD (gastroesophageal reflux disease)   . Hyperlipidemia   . Hypertension   . Lung cancer ([place])   . LV dysfunction    EF 45-50% 12/2011  . OSA (obstructive sleep apnea) 10/13/2016   Mild with AHI 9.7/hr with significant oxygen desaturations as low as 76% now on CPAP  . Other peripheral vascular disease(443.89)    bilateral lower extremity  . Tobacco use disorder    dependent  . Transient global amnesia   . Tuberculosis   . Type II diabetes mellitus ([place])    Past Surgical History:  Past Surgical History:  Procedure Laterality Date  . A FLUTTER ABLATION N/A 12/28/2011   Procedure: ABLATION A FLUTTER;  Surgeon: Evans Lance, MD;  Location: [facility] CATH LAB;  Service: Cardiovascular;  Laterality: N/A;  . CARDIAC CATHETERIZATION N/A 07/10/2015   Procedure: Left Heart Cath and Coronary Angiography;  Surgeon: Sherren Mocha, MD;  Location: [facility] CV LAB;  Service: Cardiovascular;  Laterality: N/A;  . CARDIAC ELECTROPHYSIOLOGY MAPPING AND ABLATION  03/2010  . CARDIOVERSION N/A 07/13/2016   Procedure: CARDIOVERSION;  Surgeon: Skeet Latch, MD;  Location: [facility] [place];  Service: Cardiovascular;  Laterality: N/A;  . COLONOSCOPY    . colonoscopy with polypectomy  2006 & 2011   Dr [name]  . CYSTOSCOPY  11/2007   Dr Jeffie Pollock  . IR FLUORO GUIDE PORT INSERTION RIGHT  02/18/2018  . IR GUIDED DRAIN W CATHETER PLACEMENT  01/26/2018  . IR US GUIDE BX ASP/DRAIN  01/26/2018  . IR US GUIDE VASC ACCESS RIGHT  02/18/2018  . PARTIAL HIP ARTHROPLASTY Right 01/2000   "Right; replaced ball & stem"  . PARTIAL HIP ARTHROPLASTY Left   . POLYPECTOMY    . TEE WITHOUT CARDIOVERSION  12/28/2011   Procedure: TRANSESOPHAGEAL ECHOCARDIOGRAM (TEE);  Surgeon: Larey Dresser, MD;  Location: [facility] ENDOSCOPY;  Service: Cardiovascular;  Laterality: N/A;   HPI:  Neil Carrollis a 67 y.o.malehx of aflutter, COPD, GERD, HTN, lung cancer here with hypoxia, cough.Per chart productive cough for the last several days as well as some chills.Patient had a chest x-ray 6/6 that showed a pneumonia.    Assessment / Plan / Recommendation Clinical Impression  Pt in chair for assessment, dyspneic and coughing at baseline. RR flucutating high 20's-ow 30's, on 30% HFNC. During trials of thin, puree and solid there was no clear coorelation between cough and po's (this cannot be fully assessed through clinical swallow evaluation). Educated pt re: COPD and dyshagia and precautions to mitigate aspiration risk (take rest breaks, do not eat when SOB, no talking immediately after swallowing etc.).  CCM MD's note states he is suspicous that HCAP seems less likely and respiratory failure may be related to lymphangitic spread of cancerRecommend continue thin liquids, regular diet with pt choosing softer foods to conserve energy. Do not recommend further ST at this time. If MD would like objective assessment for pna, please reconsult.     SLP Visit Diagnosis: Dysphagia, unspecified (R13.10)    Aspiration Risk  Moderate aspiration risk    Diet Recommendation Regular;Thin liquid   Liquid Administration  via: Cup;Straw Medication Administration: Whole meds with liquid Supervision: Patient able to self feed Compensations: Slow rate;Small sips/bites(rest breaks if needed) Postural Changes: Seated upright at 90 degrees    Other  Recommendations Oral Care Recommendations: Oral care BID   Follow up Recommendations None      Frequency and Duration            Prognosis        Swallow Study   General HPI: Neil Carrollis a 67 y.o.malehx of aflutter, COPD, GERD, HTN, lung cancer here with hypoxia, cough.Per chart productive cough for the last several days as well as some chills.Patient had a chest x-ray 6/6 that showed a pneumonia.  Type of Study: Bedside Swallow Evaluation Previous Swallow Assessment: (NONE) Diet Prior to this Study: Regular;Thin liquids Temperature Spikes Noted: Yes Respiratory Status: Nasal cannula History of Recent Intubation: No Behavior/Cognition: Alert;Cooperative;Pleasant mood Oral Cavity Assessment: Within Functional Limits Oral Care Completed by SLP: No Oral Cavity - Dentition: Adequate natural dentition Vision: Functional for self-feeding Self-Feeding Abilities: Able to feed self Patient Positioning: Upright in chair Baseline Vocal Quality: Normal Volitional Cough: Strong Volitional Swallow: Able to elicit    Oral/Motor/Sensory Function Overall Oral Motor/Sensory Function: Within functional limits   Ice Chips Ice chips: Not tested   Thin Liquid Thin Liquid: Within functional limits Presentation: Cup;Straw    Nectar Thick Nectar Thick Liquid: Not tested   Honey Thick Honey Thick Liquid: Not tested   Puree Puree: Within functional limits   Solid   GO   Solid: Within functional limits        Houston Siren 02/26/2018,3:46 PM  Orbie Pyo Colvin Caroli.Ed [employer] [phone]

## 2018-02-26 NOTE — Progress Notes (Signed)
Patient ID: Neil Schertzer., male   DOB: [birth year], 67 y.o.   MRN: [id number]  PROGRESS NOTE    Neil Carroll  BTD:[id number] DOB: 1950/11/13 DOA: 03/06/2018 PCP: Hoyt Koch, MD   Brief Narrative: 67 year old male with history of tobacco use, chronic diastolic heart failure, COPD, ILD, chronic atrial fibrillation, psoriasis, recurrent and metastatic non-small cell lung cancer with pleural effusion status post right-sided Pleurx catheter in 12/2017, OSA not on CPAP presented on 03/06/2018 with progressive shortness of breath for 2 weeks and was found to be hypotensive and hypoxic requiring nonrebreather and he was started on IV antibiotics for pneumonia.  He was initially admitted to ICU and required very high flow oxygen.  He also went into A. fib with RVR which he had to be started on amiodarone drip which was discontinued.  He was transferred to hospitalist service from 02/26/2018.  Assessment & Plan:   Active Problems:   Acute respiratory failure with hypoxia (HCC)  Acute on chronic hypoxic respiratory failure probably secondary to HCAP -Still requiring very high flow oxygen. -Pulmonary following -Overall prognosis is guarded to poor.  Palliative care consult is pending.  Healthcare associated pneumonia with concern for multidrug-resistant gram-negative rods versus MRSA -Unclear if this is worsening cancer versus pneumonia.  Procalcitonin less than 0.10.  If respiratory status does not improve, might need CT scan of the chest.  Currently on broad-spectrum antibiotics which will be continued though MRSA nasal PCR is negative.  Because of poor respiratory status currently, will continue vancomycin. -Cultures have been negative so far.  Interstitial lung disease -ofev discontinued by pulmonary because of overall poor prognosis  COPD  -Continue nebulizers.  Incentive spirometry  sepsis with hypotension -Probably secondary to pneumonia.  Continue antibiotics.   Cultures negative so far.  Blood pressure improved.  atrial fibrillation with rapid ventricular rate patient with chronic A. fib -Off amiodarone drip.  Currently on oral Cardizem which will be continued. Will discontinue Xarelto because of thrombocytopenia.  Metastatic non-small cell lung cancer with right-sided pleural effusion and Pleurx catheter placement -Recently treated with chemotherapy on 02/17/2018.  Follows up with Dr. Mohamed/oncology -Outpatient follow-up with oncology -Overall prognosis is poor.  Consider hospice evaluation if patient agrees.  Palliative care consult is pending.  Pancytopenia -Probably secondary to cancer/recent chemotherapy/sepsis and infection -Worsening thrombocytopenia.  No evidence of bleeding.  Will hold Xarelto for now.  If platelets continue to drop, might need hematology evaluation  Acute on chronic diastolic heart failure -Echo on 01/26/2018 had shown EF of 60 to 65%.  Continue Lasix intravenous.  Strict input and output.  Daily weights. negative balance of 1016 mL since admission.  Hypokalemia -Replace.  Repeat a.m. labs  Obesity -Outpatient follow-up.  Diabetes mellitus type 2 -Oral medications on hold.  Continue Lantus and Accu-Cheks with coverage  Dyslipidemia -Continue statin.  DVT prophylaxis: Stop Xarelto Code Status: Full Family Communication: None at bedside Disposition Plan: Depends on clinical outcome  Consultants: PCCM/palliative care  Procedures: None  Antimicrobials: Vancomycin and cefepime 03/06/2018 onwards   Subjective: Patient seen and examined at bedside.  He is still short of breath but feels slightly better.  No overnight fever, nausea or vomiting.  No overnight chest pain.  Objective: Vitals:   02/26/18 0836 02/26/18 0900 02/26/18 1000 02/26/18 1100  BP:  115/72 112/78 113/68  Pulse: (!) 126 (!) 128 (!) 132 93  Resp: (!) 26 (!) 32 (!) 31 (!) 23  Temp:      TempSrc:  SpO2: (!) 89% 94% (!) 87% 91%  Weight:       Height:        Intake/Output Summary (Last 24 hours) at 02/26/2018 1235 Last data filed at 02/26/2018 1152 Gross per 24 hour  Intake 1358.65 ml  Output 2750 ml  Net -1391.35 ml   Filed Weights   03/02/2018 2003 02/25/18 0500  Weight: 124.4 kg (274 lb 4 oz) 124.4 kg (274 lb 4 oz)    Examination:  General exam: Appears in mild distress secondary to shortness of breath.  Awake and answering questions. Respiratory system: Bilateral decreased breath sound at bases with scattered crackles Cardiovascular system: S1 & S2 heard, tachycardic intermittently.  Gastrointestinal system: Abdomen is nondistended, soft and nontender. Normal bowel sounds heard. Central nervous system: Alert and oriented. No focal neurological deficits. Moving extremities Extremities: No cyanosis, clubbing; bilateral lower extremity's are wrapped   skin: No other rashes, lesions or ulcers Psychiatry: Judgement and insight appear normal. Mood & affect appropriate.     Data Reviewed: I have personally reviewed following labs and imaging studies  CBC: Recent Labs  Lab 02/25/2018 1107 02/26/2018 1322 02/25/18 0510 02/26/18 0627  WBC 3.6* 3.1* 3.1* 3.4*  NEUTROABS 1.9 1.8  --   --   HGB 11.7* 11.5* 10.7* 10.2*  HCT 36.3* 35.1* 32.7* 31.0*  MCV 91.7 90.9 91.9 91.4  PLT 91* 100* 86* 52*   Basic Metabolic Panel: Recent Labs  Lab 03/17/2018 1107 03/07/2018 1322 02/25/18 0510 02/26/18 0627  NA 137 139 139 138  K 3.8 3.9 3.3* 3.3*  CL 98 98* 100* 100*  CO2 26 30 28 29   GLUCOSE 145* 110* 113* 130*  BUN 16 16 10 10   CREATININE 1.15 1.19 0.95 1.00  CALCIUM 8.8 8.2* 7.7* 7.6*  MG  --   --  0.8* 1.8  PHOS  --   --   --  3.0   GFR: Estimated Creatinine Clearance: 96.2 mL/min (by C-G formula based on SCr of 1 mg/dL). Liver Function Tests: Recent Labs  Lab 03/04/2018 1107 03/04/2018 1322  AST 26 29  ALT 31 36  ALKPHOS 74 61  BILITOT 0.6 0.6  PROT 7.0 6.6  ALBUMIN 2.6* 2.7*   No results for input(s): LIPASE,  AMYLASE in the last 168 hours. No results for input(s): AMMONIA in the last 168 hours. Coagulation Profile: No results for input(s): INR, PROTIME in the last 168 hours. Cardiac Enzymes: No results for input(s): CKTOTAL, CKMB, CKMBINDEX, TROPONINI in the last 168 hours. BNP (last 3 results) Recent Labs    10/13/17 1202 10/21/17 1203  PROBNP 992* 962*   HbA1C: No results for input(s): HGBA1C in the last 72 hours. CBG: Recent Labs  Lab 02/25/18 1220  GLUCAP 136*   Lipid Profile: No results for input(s): CHOL, HDL, LDLCALC, TRIG, CHOLHDL, LDLDIRECT in the last 72 hours. Thyroid Function Tests: No results for input(s): TSH, T4TOTAL, FREET4, T3FREE, THYROIDAB in the last 72 hours. Anemia Panel: No results for input(s): VITAMINB12, FOLATE, FERRITIN, TIBC, IRON, RETICCTPCT in the last 72 hours. Sepsis Labs: Recent Labs  Lab 02/23/2018 1332 03/17/2018 1520 03/07/2018 1835 02/28/2018 2022 02/25/18 0510 02/26/18 0627  PROCALCITON  --   --   --   --  <0.10 <0.10  LATICACIDVEN 2.14* 0.93 0.9 0.9  --   --     Recent Results (from the past 240 hour(s))  Culture, Blood     Status: None (Preliminary result)   Collection Time: 02/21/2018 11:52 AM  Result Value  Ref Range Status   Specimen Description BLOOD PORTA CATH  Final   Special Requests   Final    BOTTLES DRAWN AEROBIC AND ANAEROBIC Blood Culture adequate volume   Culture   Final    NO GROWTH < 24 HOURS Performed at [facility], [address] [place], [place] [address]    Report Status PENDING  Incomplete  Blood Culture (routine x 2)     Status: None (Preliminary result)   Collection Time: 02/19/2018  1:22 PM  Result Value Ref Range Status   Specimen Description   Final    BLOOD LEFT FOREARM Performed at [facility] [address] [place], [place] [address]    Special Requests   Final    BOTTLES DRAWN AEROBIC AND ANAEROBIC Blood Culture results may not be optimal due to an inadequate volume of blood  received in culture bottles Performed at [facility] [address] [place], [place] [address]    Culture   Final    NO GROWTH < 24 HOURS Performed at [facility] [address] [place], [place] [address]    Report Status PENDING  Incomplete  Urine culture     Status: Abnormal   Collection Time: 03/17/2018  5:16 PM  Result Value Ref Range Status   Specimen Description   Final    URINE, CLEAN CATCH Performed at [facility], [place] [address], [place], [place] [address]    Special Requests   Final    NONE Performed at [facility], [place] [address] [place], [place] [address]    Culture MULTIPLE SPECIES PRESENT, SUGGEST RECOLLECTION (A)  Final   Report Status 02/25/2018 FINAL  Final  MRSA PCR Screening     Status: None   Collection Time: 02/19/2018  8:13 PM  Result Value Ref Range Status   MRSA by PCR NEGATIVE NEGATIVE Final    Comment:        The GeneXpert MRSA Assay (FDA approved for NASAL specimens only), is one component of a comprehensive MRSA colonization surveillance program. It is not intended to diagnose MRSA infection nor to guide or monitor treatment for MRSA infections. Performed at [facility], [place] [address] [place], [place] [address]   Blood Culture (routine x 2)     Status: None (Preliminary result)   Collection Time: 03/16/2018  8:39 PM  Result Value Ref Range Status   Specimen Description   Final    BLOOD LEFT HAND Performed at [facility] [address], [place], [place] [address]    Special Requests   Final    BOTTLES DRAWN AEROBIC ONLY Blood Culture adequate volume   Culture PENDING  Incomplete   Report Status PENDING  Incomplete  Culture, expectorated sputum-assessment     Status: None   Collection Time: 02/25/18 11:13 AM  Result Value Ref Range Status   Specimen Description SPUTUM  Final   Special Requests NONE  Final   Sputum evaluation    Final    THIS SPECIMEN IS ACCEPTABLE FOR SPUTUM CULTURE Performed at [facility], [place] [address], [place],  [address]    Report Status 02/25/2018 FINAL  Final  Culture, respiratory (NON-Expectorated)     Status: None (Preliminary result)   Collection Time: 02/25/18 11:13 AM  Result Value Ref Range Status   Specimen Description   Final    SPUTUM Performed at [facility]  [address] [place], [place] [address]    Special Requests   Final    NONE Reflexed from [id number] Performed at [facility], [place] [address], [place], [place] [address]    Gram Stain   Final    FEW WBC PRESENT,BOTH PMN AND MONONUCLEAR FEW SQUAMOUS EPITHELIAL CELLS PRESENT FEW GRAM POSITIVE COCCI IN CLUSTERS RARE GRAM NEGATIVE COCCI IN PAIRS RARE GRAM POSITIVE RODS    Culture   Final    CULTURE REINCUBATED FOR BETTER GROWTH Performed at [facility], [place] [address] [place], [place] [address]    Report Status PENDING  Incomplete         Radiology Studies: Dg Chest Port 1 View  Result Date: 02/26/2018 CLINICAL DATA:  Shortness of breath EXAM: PORTABLE CHEST 1 VIEW COMPARISON:  Yesterday FINDINGS: Coarse interstitial opacities on the right more than left. On the right there is extrapleural thickening that is stable. Normal heart size. Stable right-sided porta catheter positioning. IMPRESSION: Stable extensive opacity superimposed on COPD and fibrosis. Stable right-sided pleural fluid and thickening with pleural drain in place. Electronically Signed   By: Monte Fantasia M.D.   On: 02/26/2018 07:16   Dg Chest Port 1 View  Result Date: 02/25/2018 CLINICAL DATA:  Acute onset of shortness of breath. Follow-up pneumonia. EXAM: PORTABLE CHEST 1 VIEW COMPARISON:  Chest radiograph performed 03/19/2018 FINDINGS: Pulmonary fibrotic change and postradiation change are again noted. A small right pleural effusion is noted. Increased left basilar airspace  opacification raises concern for superimposed pneumonia. No pneumothorax is seen. The cardiomediastinal silhouette is normal in size. A right-sided chest port is noted ending about the distal SVC. No acute osseous abnormalities are identified. IMPRESSION: Increased left basilar airspace opacification raises concern for superimposed pneumonia. Small right pleural effusion noted. Pulmonary fibrotic change and postradiation change again noted. Electronically Signed   By: Garald Balding M.D.   On: 02/25/2018 04:51        Scheduled Meds: . budesonide (PULMICORT) nebulizer solution  0.5 mg Nebulization BID   And  . ipratropium  0.5 mg Nebulization Q6H   And  . arformoterol  15 mcg Nebulization BID  . Chlorhexidine Gluconate Cloth  6 each Topical Daily  . diltiazem  60 mg Oral Q6H  . fluticasone  1 spray Each Nare Daily  . folic acid  1 mg Oral Daily  . furosemide  40 mg Intravenous Q12H  . guaiFENesin  600 mg Oral BID  . insulin aspart  0-9 Units Subcutaneous TID WC  . insulin glargine  30 Units Subcutaneous QHS  . metoprolol tartrate  50 mg Oral BID  . nicotine  21 mg Transdermal Daily  . rivaroxaban  20 mg Oral Q supper  . rosuvastatin  10 mg Oral Daily  . sodium chloride  1 spray Each Nare Q6H  . sodium chloride flush  10-40 mL Intracatheter Q12H   Continuous Infusions: . sodium chloride 10 mL/hr at 02/26/18 0500  . ceFEPime (MAXIPIME) IV Stopped (02/26/18 0543)  . lactated ringers 10 mL/hr at 02/26/18 0500  . vancomycin Stopped (02/26/18 0930)     LOS: 2 days        Aline August, MD [facility] Pager [phone]  If 7PM-7AM, please contact night-coverage [email or web address] Password [facility] 02/26/2018, 12:35 PM

## 2018-02-27 LAB — PROCALCITONIN

## 2018-02-27 LAB — CULTURE, RESPIRATORY W GRAM STAIN

## 2018-02-27 LAB — CBC WITH DIFFERENTIAL/PLATELET
BAND NEUTROPHILS: 0 %
BASOS ABS: 0 10*3/uL (ref 0.0–0.1)
BLASTS: 0 %
Basophils Relative: 0 %
Eosinophils Absolute: 0.1 10*3/uL (ref 0.0–0.7)
Eosinophils Relative: 2 %
HCT: 32.2 % — ABNORMAL LOW (ref 39.0–52.0)
HEMOGLOBIN: 10.5 g/dL — AB (ref 13.0–17.0)
Lymphocytes Relative: 26 %
Lymphs Abs: 1.2 10*3/uL (ref 0.7–4.0)
MCH: 29.7 pg (ref 26.0–34.0)
MCHC: 32.6 g/dL (ref 30.0–36.0)
MCV: 91.2 fL (ref 78.0–100.0)
METAMYELOCYTES PCT: 0 %
MYELOCYTES: 0 %
Monocytes Absolute: 0.8 10*3/uL (ref 0.1–1.0)
Monocytes Relative: 17 %
Neutro Abs: 2.5 10*3/uL (ref 1.7–7.7)
Neutrophils Relative %: 55 %
Platelets: 49 10*3/uL — ABNORMAL LOW (ref 150–400)
Promyelocytes Relative: 0 %
RBC: 3.53 MIL/uL — AB (ref 4.22–5.81)
RDW: 14.6 % (ref 11.5–15.5)
WBC: 4.6 10*3/uL (ref 4.0–10.5)
nRBC: 0 /100 WBC

## 2018-02-27 LAB — COMPREHENSIVE METABOLIC PANEL
ALK PHOS: 71 U/L (ref 38–126)
ALT: 62 U/L (ref 17–63)
AST: 40 U/L (ref 15–41)
Albumin: 2.6 g/dL — ABNORMAL LOW (ref 3.5–5.0)
Anion gap: 10 (ref 5–15)
BUN: 11 mg/dL (ref 6–20)
CALCIUM: 7.8 mg/dL — AB (ref 8.9–10.3)
CO2: 27 mmol/L (ref 22–32)
CREATININE: 1.02 mg/dL (ref 0.61–1.24)
Chloride: 99 mmol/L — ABNORMAL LOW (ref 101–111)
GFR calc non Af Amer: >60 mL/min (ref >60)
Glucose, Bld: 150 mg/dL — ABNORMAL HIGH (ref 65–99)
Potassium: 3.7 mmol/L (ref 3.5–5.1)
Sodium: 136 mmol/L (ref 135–145)
Total Bilirubin: 0.8 mg/dL (ref 0.3–1.2)
Total Protein: 6.8 g/dL (ref 6.5–8.1)

## 2018-02-27 LAB — MAGNESIUM: Magnesium: 1.9 mg/dL (ref 1.7–2.4)

## 2018-02-27 LAB — CULTURE, RESPIRATORY: CULTURE: NORMAL

## 2018-02-27 MED ORDER — LORAZEPAM 2 MG/ML IJ SOLN
0.5000 mg/h | INTRAVENOUS | Status: DC
  Administered 2018-02-27: 0.5 mg/h via INTRAVENOUS
  Filled 2018-02-27: qty 25

## 2018-02-27 MED ORDER — LORAZEPAM 2 MG/ML IJ SOLN
1.0000 mg | INTRAMUSCULAR | Status: DC | PRN
  Administered 2018-02-27: 1 mg via INTRAVENOUS
  Filled 2018-02-27: qty 1

## 2018-02-27 MED ORDER — HALOPERIDOL LACTATE 5 MG/ML IJ SOLN: 5.0000 mg | Freq: Four times a day (QID) | INTRAMUSCULAR | Status: DC | PRN

## 2018-02-27 MED ORDER — MORPHINE 100MG IN NS 100ML (1MG/ML) PREMIX INFUSION
1.0000 mg/h | INTRAVENOUS | Status: DC
  Administered 2018-02-27: 1 mg/h via INTRAVENOUS
  Filled 2018-02-27: qty 100

## 2018-02-28 LAB — GLUCOSE, CAPILLARY
GLUCOSE-CAPILLARY: 156 mg/dL — AB (ref 65–99)
GLUCOSE-CAPILLARY: 119 mg/dL — AB (ref 65–99)
GLUCOSE-CAPILLARY: 162 mg/dL — AB (ref 65–99)
GLUCOSE-CAPILLARY: 126 mg/dL — AB (ref 65–99)
GLUCOSE-CAPILLARY: 153 mg/dL — AB (ref 65–99)
Glucose-Capillary: 131 mg/dL — ABNORMAL HIGH (ref 65–99)
Glucose-Capillary: 172 mg/dL — ABNORMAL HIGH (ref 65–99)
Glucose-Capillary: 152 mg/dL — ABNORMAL HIGH (ref 65–99)

## 2018-03-01 ENCOUNTER — Encounter: Payer: Self-pay | Admitting: *Deleted

## 2018-03-01 LAB — CULTURE, BLOOD (ROUTINE X 2): Culture: NO GROWTH

## 2018-03-01 LAB — CULTURE, BLOOD (SINGLE)
Culture: NO GROWTH
Special Requests: ADEQUATE

## 2018-03-01 NOTE — Progress Notes (Signed)
These preliminary result these preliminary results were noted.  Awaiting final report.

## 2018-03-02 LAB — CULTURE, BLOOD (ROUTINE X 2)
Culture: NO GROWTH
SPECIAL REQUESTS: ADEQUATE

## 2018-03-03 ENCOUNTER — Other Ambulatory Visit: Payer: Medicare Other

## 2018-03-04 ENCOUNTER — Ambulatory Visit: Payer: Medicare Other | Admitting: Pulmonary Disease

## 2018-03-10 ENCOUNTER — Ambulatory Visit: Payer: Medicare Other

## 2018-03-10 ENCOUNTER — Other Ambulatory Visit: Payer: Medicare Other

## 2018-03-10 ENCOUNTER — Ambulatory Visit: Payer: Medicare Other | Admitting: Nurse Practitioner

## 2018-03-17 ENCOUNTER — Other Ambulatory Visit: Payer: Medicare Other

## 2018-03-17 ENCOUNTER — Ambulatory Visit: Payer: Medicare Other | Admitting: Endocrinology

## 2018-03-21 NOTE — Progress Notes (Signed)
Called to patients room by family member, patient with no heart beat and no respirations. Verified with Wilkie Aye RN.

## 2018-03-21 NOTE — Progress Notes (Signed)
Patient transferred from ICU in chair on non rebreather mask, which was changed to [place] due to patient agitation. Morphine drip infusing, Ativan drip ordered for comfort. Patient very anxious, families questions discussed. Will continue to monitor.

## 2018-03-21 NOTE — Progress Notes (Signed)
Morphine 40 ml and Ativan 30 ml wasted in sink with Aldean Baker RN.

## 2018-03-21 NOTE — Death Summary Note (Signed)
Death Summary  Neil Carroll VOJ:[id number] DOB: 1951/01/11 DOA: Mar 09, 2018  PCP: Hoyt Koch, MD  Admit date: 03/09/2018 Date of Death: 03-12-18  time of Death: 13-Dec-1643   History of present illness:  67 year old male with history of tobacco use, chronic diastolic heart failure, COPD, ILD, chronic atrial fibrillation, psoriasis, recurrent and metastatic non-small cell lung cancer with pleural effusion status post right-sided Pleurx catheter in 12/2017, OSA not on CPAP presented on Mar 09, 2018 with progressive shortness of breath for 2 weeks and was found to be hypotensive and hypoxic requiring nonrebreather and he was started on IV antibiotics for pneumonia.  He was initially admitted to ICU and required very high flow oxygen.  He also went into A. fib with RVR for which he had to be started on amiodarone drip which was discontinued.  He was transferred to hospitalist service from 02/26/2018.  He was initially full code but his condition did not improve and his CODE STATUS was changed to DNR after patient's and family members discussion with PCCM.  Because of worsening general condition, he was made comfort measures on 2018-03-12 and was started on morphine drip.  He expired on Mar 12, 2018.   Final Diagnoses:  Acute on chronic hypoxic restaurant failure secondary to [place] associated pneumonia with concern for multidrug-resistant gram-negative rods versus MRSA Interstitial lung disease COPD Sepsis with hypotension Atrial fibrillation with rapid ventricular rate in a patient with chronic A. fib Diastolic non-small cell lung cancer with right-sided pleural effusion and Pleurx catheter placement Pancytopenia Acute on chronic diastolic heart failure Hypokalemia Obesity Diabetes mellitus type 2 Dyslipidemia    The results of significant diagnostics from this hospitalization (including imaging, microbiology, ancillary and laboratory) are listed below for reference.     Significant Diagnostic Studies: Dg Chest 2 View  Result Date: 03/09/18 CLINICAL DATA:  Shortness of breath, fever.  History of lung cancer. EXAM: CHEST - 2 VIEW COMPARISON:  Radiographs of Jan 24, 2018. FINDINGS: Stable cardiomediastinal silhouette. Interval placement of right internal jugular Port-A-Cath with distal tip in expected position of cavoatrial junction. No pneumothorax is noted. Increased left basilar opacity is noted concerning for worsening edema or possibly pneumonia. Stable right lung opacities are noted concerning for pneumonia or atelectasis with probable loculated pleural effusion. Interval placement of right pleural drain is noted. Bony thorax is unremarkable. IMPRESSION: Worsening left basilar edema or pneumonia. Stable right lung opacities are noted concerning for pneumonia or atelectasis with probable loculated right pleural effusion. Interval placement of right internal jugular Port-A-Cath. Interval placement of right-sided pleural drainage catheter. Electronically Signed   By: Marijo Conception, M.D.   On: 2018-03-09 12:11   Ir US Guide Vasc Access Right  Result Date: 02/18/2018 CLINICAL DATA:  Metastatic right lung carcinoma. Needs durable venous access for chemotherapy regimen. EXAM: TUNNELED PORT CATHETER PLACEMENT WITH ULTRASOUND AND FLUOROSCOPIC GUIDANCE FLUOROSCOPY TIME:  0.1 minutes; 104 uGym2 DAP ANESTHESIA/SEDATION: Intravenous Fentanyl and Versed were administered as conscious sedation during continuous monitoring of the patient's level of consciousness and physiological / cardiorespiratory status by the radiology RN, with a total moderate sedation time of 17 minutes. TECHNIQUE: The procedure, risks, benefits, and alternatives were explained to the patient. Questions regarding the procedure were encouraged and answered. The patient understands and consents to the procedure. As antibiotic prophylaxis, cefazolin 3 g was ordered pre-procedure and administered intravenously  within one hour of incision. Patency of the right IJ vein was confirmed with ultrasound with image documentation. An appropriate skin site was determined. Skin  site was marked. Region was prepped using maximum barrier technique including cap and mask, sterile gown, sterile gloves, large sterile sheet, and Chlorhexidine as cutaneous antisepsis. The region was infiltrated locally with 1% lidocaine. Under real-time ultrasound guidance, the right IJ vein was accessed with a 21 gauge micropuncture needle; the needle tip within the vein was confirmed with ultrasound image documentation. Needle was exchanged over a 018 guidewire for transitional dilator which allowed passage of the [facility] wire into the IVC. Over this, the transitional dilator was exchanged for a 5 [place] MPA catheter. A small incision was made on the right anterior chest wall and a subcutaneous pocket fashioned. The power-injectable port was positioned and its catheter tunneled to the right IJ dermatotomy site. The MPA catheter was exchanged over an Amplatz wire for a peel-away sheath, through which the port catheter, which had been trimmed to the appropriate length, was advanced and positioned under fluoroscopy with its tip at the cavoatrial junction. Spot chest radiograph confirms good catheter position and no pneumothorax. The pocket was closed with deep interrupted and subcuticular continuous 3-0 Monocryl sutures. The port was flushed per protocol. The incisions were covered with Dermabond then covered with a sterile dressing. COMPLICATIONS: COMPLICATIONS None immediate IMPRESSION: Technically successful right IJ power-injectable port catheter placement. Ready for routine use. Electronically Signed   By: Lucrezia Europe M.D.   On: 02/18/2018 13:40   Dg Chest Port 1 View  Result Date: 02/26/2018 CLINICAL DATA:  Shortness of breath EXAM: PORTABLE CHEST 1 VIEW COMPARISON:  Yesterday FINDINGS: Coarse interstitial opacities on the right more than left. On the  right there is extrapleural thickening that is stable. Normal heart size. Stable right-sided porta catheter positioning. IMPRESSION: Stable extensive opacity superimposed on COPD and fibrosis. Stable right-sided pleural fluid and thickening with pleural drain in place. Electronically Signed   By: Monte Fantasia M.D.   On: 02/26/2018 07:16   Dg Chest Port 1 View  Result Date: 02/25/2018 CLINICAL DATA:  Acute onset of shortness of breath. Follow-up pneumonia. EXAM: PORTABLE CHEST 1 VIEW COMPARISON:  Chest radiograph performed 03/13/2018 FINDINGS: Pulmonary fibrotic change and postradiation change are again noted. A small right pleural effusion is noted. Increased left basilar airspace opacification raises concern for superimposed pneumonia. No pneumothorax is seen. The cardiomediastinal silhouette is normal in size. A right-sided chest port is noted ending about the distal SVC. No acute osseous abnormalities are identified. IMPRESSION: Increased left basilar airspace opacification raises concern for superimposed pneumonia. Small right pleural effusion noted. Pulmonary fibrotic change and postradiation change again noted. Electronically Signed   By: Garald Balding M.D.   On: 02/25/2018 04:51   Ir Fluoro Guide Port Insertion Right  Result Date: 02/18/2018 CLINICAL DATA:  Metastatic right lung carcinoma. Needs durable venous access for chemotherapy regimen. EXAM: TUNNELED PORT CATHETER PLACEMENT WITH ULTRASOUND AND FLUOROSCOPIC GUIDANCE FLUOROSCOPY TIME:  0.1 minutes; 104 uGym2 DAP ANESTHESIA/SEDATION: Intravenous Fentanyl and Versed were administered as conscious sedation during continuous monitoring of the patient's level of consciousness and physiological / cardiorespiratory status by the radiology RN, with a total moderate sedation time of 17 minutes. TECHNIQUE: The procedure, risks, benefits, and alternatives were explained to the patient. Questions regarding the procedure were encouraged and answered. The  patient understands and consents to the procedure. As antibiotic prophylaxis, cefazolin 3 g was ordered pre-procedure and administered intravenously within one hour of incision. Patency of the right IJ vein was confirmed with ultrasound with image documentation. An appropriate skin site was determined. Skin site was  marked. Region was prepped using maximum barrier technique including cap and mask, sterile gown, sterile gloves, large sterile sheet, and Chlorhexidine as cutaneous antisepsis. The region was infiltrated locally with 1% lidocaine. Under real-time ultrasound guidance, the right IJ vein was accessed with a 21 gauge micropuncture needle; the needle tip within the vein was confirmed with ultrasound image documentation. Needle was exchanged over a 018 guidewire for transitional dilator which allowed passage of the [facility] wire into the IVC. Over this, the transitional dilator was exchanged for a 5 [place] MPA catheter. A small incision was made on the right anterior chest wall and a subcutaneous pocket fashioned. The power-injectable port was positioned and its catheter tunneled to the right IJ dermatotomy site. The MPA catheter was exchanged over an Amplatz wire for a peel-away sheath, through which the port catheter, which had been trimmed to the appropriate length, was advanced and positioned under fluoroscopy with its tip at the cavoatrial junction. Spot chest radiograph confirms good catheter position and no pneumothorax. The pocket was closed with deep interrupted and subcuticular continuous 3-0 Monocryl sutures. The port was flushed per protocol. The incisions were covered with Dermabond then covered with a sterile dressing. COMPLICATIONS: COMPLICATIONS None immediate IMPRESSION: Technically successful right IJ power-injectable port catheter placement. Ready for routine use. Electronically Signed   By: Lucrezia Europe M.D.   On: 02/18/2018 13:40    Microbiology: Recent Results (from the past 240 hour(s))    Culture, Blood     Status: None (Preliminary result)   Collection Time: 03/18/2018 11:52 AM  Result Value Ref Range Status   Specimen Description BLOOD PORTA CATH  Final   Special Requests   Final    BOTTLES DRAWN AEROBIC AND ANAEROBIC Blood Culture adequate volume   Culture   Final    NO GROWTH 3 DAYS Performed at [facility], [address], [place], [place] [address]    Report Status PENDING  Incomplete  Blood Culture (routine x 2)     Status: None (Preliminary result)   Collection Time: 02/28/2018  1:22 PM  Result Value Ref Range Status   Specimen Description   Final    BLOOD LEFT FOREARM Performed at [place] [address], [place], [place] [address]    Special Requests   Final    BOTTLES DRAWN AEROBIC AND ANAEROBIC Blood Culture results may not be optimal due to an inadequate volume of blood received in culture bottles Performed at [place] [address] [place], [place] [address]    Culture   Final    NO GROWTH 3 DAYS Performed at [facility], [place] [address] [place], [place] [address]    Report Status PENDING  Incomplete  Urine culture     Status: Abnormal   Collection Time: 02/26/2018  5:16 PM  Result Value Ref Range Status   Specimen Description   Final    URINE, CLEAN CATCH Performed at [facility], [place] [address], [place], [place] [address]    Special Requests   Final    NONE Performed at [facility], [place] [address] [place],  [address]    Culture MULTIPLE SPECIES PRESENT, SUGGEST RECOLLECTION (A)  Final   Report Status 02/25/2018 FINAL  Final  MRSA PCR Screening     Status: None   Collection Time: 03/19/2018  8:13 PM  Result Value Ref Range Status   MRSA by PCR NEGATIVE NEGATIVE Final    Comment:  The GeneXpert MRSA Assay (FDA approved for NASAL specimens only), is one component of a comprehensive MRSA colonization surveillance program. It is  not intended to diagnose MRSA infection nor to guide or monitor treatment for MRSA infections. Performed at [facility], [place] [address], [place], [place] [address]   Blood Culture (routine x 2)     Status: None (Preliminary result)   Collection Time: 03/06/2018  8:39 PM  Result Value Ref Range Status   Specimen Description   Final    BLOOD LEFT HAND Performed at [place] [address] [place], [place] [address]    Special Requests   Final    BOTTLES DRAWN AEROBIC ONLY Blood Culture adequate volume   Culture   Final    NO GROWTH 2 DAYS Performed at [facility], [place] [address] [place], [place] [address]    Report Status PENDING  Incomplete  Culture, expectorated sputum-assessment     Status: None   Collection Time: 02/25/18 11:13 AM  Result Value Ref Range Status   Specimen Description SPUTUM  Final   Special Requests NONE  Final   Sputum evaluation   Final    THIS SPECIMEN IS ACCEPTABLE FOR SPUTUM CULTURE Performed at [facility], [place] [address], [place], [place] [address]    Report Status 02/25/2018 FINAL  Final  Culture, respiratory (NON-Expectorated)     Status: None   Collection Time: 02/25/18 11:13 AM  Result Value Ref Range Status   Specimen Description   Final    SPUTUM Performed at [place] [address], [place], [place] [address]    Special Requests   Final    NONE Reflexed from [id number] Performed at [place] [address] [place], [place] [address]    Gram Stain   Final    FEW WBC PRESENT,BOTH PMN AND MONONUCLEAR FEW SQUAMOUS EPITHELIAL CELLS PRESENT FEW GRAM POSITIVE COCCI IN CLUSTERS RARE GRAM NEGATIVE COCCI IN PAIRS RARE GRAM POSITIVE RODS    Culture   Final    Consistent with normal respiratory flora. Performed at [facility], [place] [address] [place], [place] [address]    Report Status 03/12/2018 FINAL  Final      Labs: Basic Metabolic Panel: Recent Labs  Lab 02/23/2018 1107 02/20/2018 1322 02/25/18 0510 02/26/18 0627 03/12/2018 0511  NA 137 139 139 138 136  K 3.8 3.9 3.3* 3.3* 3.7  CL 98 98* 100* 100* 99*  CO2 26 30 28 29 27   GLUCOSE 145* 110* 113* 130* 150*  BUN 16 16 10 10 11   CREATININE 1.15 1.19 0.95 1.00 1.02  CALCIUM 8.8 8.2* 7.7* 7.6* 7.8*  MG  --   --  0.8* 1.8 1.9  PHOS  --   --   --  3.0  --    Liver Function Tests: Recent Labs  Lab 03/10/2018 1107 02/21/2018 1322 2018-03-12 0511  AST 26 29 40  ALT 31 36 62  ALKPHOS 74 61 71  BILITOT 0.6 0.6 0.8  PROT 7.0 6.6 6.8  ALBUMIN 2.6* 2.7* 2.6*   No results for input(s): LIPASE, AMYLASE in the last 168 hours. No results for input(s): AMMONIA in the last 168 hours. CBC: Recent Labs  Lab 03/04/2018 1107 03/04/2018 1322 02/25/18 0510 02/26/18 0627 12-Mar-2018 0511  WBC 3.6* 3.1* 3.1* 3.4* 4.6  NEUTROABS 1.9 1.8  --   --  2.5  HGB 11.7* 11.5* 10.7* 10.2* 10.5*  HCT  36.3* 35.1* 32.7* 31.0* 32.2*  MCV 91.7 90.9 91.9 91.4 91.2  PLT 91* 100* 86* 52* 49*   Cardiac Enzymes: No results for input(s): CKTOTAL, CKMB, CKMBINDEX, TROPONINI in the last 168 hours. D-Dimer No results for input(s): DDIMER in the last 72 hours. BNP: Invalid input(s): POCBNP CBG: Recent Labs  Lab 02/26/18 1151 02/26/18 1558 02/26/18 2150 03/22/18 0737 03-22-18 1135  GLUCAP 119* 152* 162* 126* 153*   Anemia work up No results for input(s): VITAMINB12, FOLATE, FERRITIN, TIBC, IRON, RETICCTPCT in the last 72 hours. Urinalysis    Component Value Date/Time   COLORURINE YELLOW 02/20/2018 1716   APPEARANCEUR CLEAR 03/12/2018 1716   LABSPEC 1.018 02/19/2018 1716   PHURINE 7.0 02/22/2018 1716   GLUCOSEU NEGATIVE 03/14/2018 [place] 05/25/2017 1158   HGBUR SMALL (A) 03/16/2018 1716   BILIRUBINUR NEGATIVE 02/19/2018 1716   KETONESUR NEGATIVE 02/20/2018 1716   PROTEINUR 30 (A) 03/05/2018 1716   UROBILINOGEN 0.2 05/25/2017 1158   NITRITE  NEGATIVE 03/02/2018 1716   LEUKOCYTESUR NEGATIVE 02/25/2018 1716   Sepsis Labs Invalid input(s): PROCALCITONIN,  WBC,  LACTICIDVEN     SIGNED:  Aline August, MD  [facility] 02/28/2018, 11:09 AM Pager: [phone]  If 7PM-7AM, please contact night-coverage [email or web address] Password [email or web address]

## 2018-03-21 NOTE — Progress Notes (Signed)
Patient ID: Neil Mosley., male   DOB: 01/27/1951, 67 y.o.   MRN: [id number]  PROGRESS NOTE    Neil Carroll  PYP:[id number] DOB: [birth year] DOA: 02/23/2018 PCP: Hoyt Koch, MD   Brief Narrative: 67 year old male with history of tobacco use, chronic diastolic heart failure, COPD, ILD, chronic atrial fibrillation, psoriasis, recurrent and metastatic non-small cell lung cancer with pleural effusion status post right-sided Pleurx catheter in 12/2017, OSA not on CPAP presented on 02/23/2018 with progressive shortness of breath for 2 weeks and was found to be hypotensive and hypoxic requiring nonrebreather and he was started on IV antibiotics for pneumonia.  He was initially admitted to ICU and required very high flow oxygen.  He also went into A. fib with RVR which he had to be started on amiodarone drip which was discontinued.  He was transferred to hospitalist service from 02/26/2018.  Assessment & Plan:   Active Problems:   Acute respiratory failure with hypoxia (HCC)   Atrial fibrillation with RVR (HCC)  Acute on chronic hypoxic respiratory failure probably secondary to HCAP -Still requiring very high flow oxygen. -Pulmonary following -Overall prognosis is very poor. -Extremely short of breath this morning and struggling to breathe.  Patient was given a dose of intravenous morphine and is still very tachypneic and short of breath.  After discussion with the patient, he is agreed for comfort measures on morphine drip because of the very poor prognosis.  Will discontinue lab work, further imaging and other medications including antibiotics.  Will focus only on comfort with oxygen and morphine drip. -Discontinue telemetry.  Transfer to [place].   Healthcare associated pneumonia with concern for multidrug-resistant gram-negative rods versus MRSA -Unclear if this is worsening cancer versus pneumonia.  Procalcitonin less than 0.10.  Currently on cefepime.  Vancomycin was  discontinued.  Cultures negative so far. -Plan as above  Interstitial lung disease -ofev discontinued by pulmonary because of overall poor prognosis  COPD  -Plan as above  sepsis with hypotension -Probably secondary to pneumonia.  Discontinue antibiotics.  atrial fibrillation with rapid ventricular rate patient with chronic A. fib -Currently very tachycardic.  Plan as above.  Metastatic non-small cell lung cancer with right-sided pleural effusion and Pleurx catheter placement -Recently treated with chemotherapy on 02/17/2018.  Follows up with Dr. Mohamed/oncology -Start comfort measures.  Pancytopenia -Probably secondary to cancer/recent chemotherapy/sepsis and infection -No further labs.  Acute on chronic diastolic heart failure -Echo on 01/26/2018 had shown EF of 60 to 65%.  Plan as above  Hypokalemia -No further labs will be done  Obesity  Diabetes mellitus type 2 -Discontinue insulin and Accu-Cheks  Dyslipidemia -Discontinue statin  DVT prophylaxis: None for comfort measures Code Status: DNR Family Communication: None at bedside Disposition Plan: Depends on clinical outcome  Consultants: PCCM/palliative care  Procedures: None  Antimicrobials:  cefepime 03/03/2018 onwards Vancomycin from 03/05/2018-02/26/2018   Subjective: Patient seen and examined at bedside.  I saw him twice today in rounds.  At first, he was short of breath but still comfortable.  I was called by the nurse later on saying that he was extremely short of breath.  On my evaluation, patient is very tachypneic and struggling to breathe, unable to complete sentences but he wants to be made as comfortable as possible and wants comfort measures with morphine.  He is okay for me to discontinue other medications.  Objective: Vitals:   03-08-2018 0601 03-08-2018 0700 03/08/18 0758 2018/03/08 0800  BP: 115/83 107/63  124/61  Pulse:  [name])  139 (!) 128 (!) 36  Resp:   (!) 28 18  Temp:    98.5 F (36.9 C)  TempSrc:     Oral  SpO2:  96% 94% 91%  Weight:      Height:        Intake/Output Summary (Last 24 hours) at 03/17/18 1000 Last data filed at Mar 17, 2018 0700 Gross per 24 hour  Intake 680 ml  Output 1025 ml  Net -345 ml   Filed Weights   03/17/2018 2003 02/25/18 0500  Weight: 124.4 kg (274 lb 4 oz) 124.4 kg (274 lb 4 oz)    Examination:  General exam: Extreme distress secondary to severe shortness of breath.  Extremely ill looking respiratory system: Bilateral decreased breath sound at bases with scattered crackles usually Cardiovascular system: S1 & S2 heard, extremely tachycardic Gastrointestinal system: Abdomen is nondistended, soft and nontender. Normal bowel sounds heard. Central nervous system: Alert and oriented. No focal neurological deficits. Moving extremities Extremities: Bilateral lower extremity's are wrapped, no cyanosis or clubbing skin: No other rashes, lesions or ulcers Psychiatry: Looks anxious Lymph: No cervical lymphadenopathy    Data Reviewed: I have personally reviewed following labs and imaging studies  CBC: Recent Labs  Lab 03/07/2018 1107 02/26/2018 1322 02/25/18 0510 02/26/18 0627 2018-03-17 0511  WBC 3.6* 3.1* 3.1* 3.4* 4.6  NEUTROABS 1.9 1.8  --   --  2.5  HGB 11.7* 11.5* 10.7* 10.2* 10.5*  HCT 36.3* 35.1* 32.7* 31.0* 32.2*  MCV 91.7 90.9 91.9 91.4 91.2  PLT 91* 100* 86* 52* 49*   Basic Metabolic Panel: Recent Labs  Lab 03/15/2018 1107 02/22/2018 1322 02/25/18 0510 02/26/18 0627 Mar 17, 2018 0511  NA 137 139 139 138 136  K 3.8 3.9 3.3* 3.3* 3.7  CL 98 98* 100* 100* 99*  CO2 26 30 28 29 27   GLUCOSE 145* 110* 113* 130* 150*  BUN 16 16 10 10 11   CREATININE 1.15 1.19 0.95 1.00 1.02  CALCIUM 8.8 8.2* 7.7* 7.6* 7.8*  MG  --   --  0.8* 1.8 1.9  PHOS  --   --   --  3.0  --    GFR: Estimated Creatinine Clearance: 94.3 mL/min (by C-G formula based on SCr of 1.02 mg/dL). Liver Function Tests: Recent Labs  Lab 02/23/2018 1107 03/18/2018 1322 03/17/18 0511    AST 26 29 40  ALT 31 36 62  ALKPHOS 74 61 71  BILITOT 0.6 0.6 0.8  PROT 7.0 6.6 6.8  ALBUMIN 2.6* 2.7* 2.6*   No results for input(s): LIPASE, AMYLASE in the last 168 hours. No results for input(s): AMMONIA in the last 168 hours. Coagulation Profile: No results for input(s): INR, PROTIME in the last 168 hours. Cardiac Enzymes: No results for input(s): CKTOTAL, CKMB, CKMBINDEX, TROPONINI in the last 168 hours. BNP (last 3 results) Recent Labs    10/13/17 1202 10/21/17 1203  PROBNP 992* 962*   HbA1C: No results for input(s): HGBA1C in the last 72 hours. CBG: Recent Labs  Lab 02/25/18 1220  GLUCAP 136*   Lipid Profile: No results for input(s): CHOL, HDL, LDLCALC, TRIG, CHOLHDL, LDLDIRECT in the last 72 hours. Thyroid Function Tests: No results for input(s): TSH, T4TOTAL, FREET4, T3FREE, THYROIDAB in the last 72 hours. Anemia Panel: No results for input(s): VITAMINB12, FOLATE, FERRITIN, TIBC, IRON, RETICCTPCT in the last 72 hours. Sepsis Labs: Recent Labs  Lab 03/01/2018 1332 03/01/2018 1520 03/14/2018 1835 02/23/2018 2022 02/25/18 0510 02/26/18 0627 17-Mar-2018 0511  PROCALCITON  --   --   --   --  <  0.10 <0.10 <0.10  LATICACIDVEN 2.14* 0.93 0.9 0.9  --   --   --     Recent Results (from the past 240 hour(s))  Culture, Blood     Status: None (Preliminary result)   Collection Time: 02/26/2018 11:52 AM  Result Value Ref Range Status   Specimen Description BLOOD PORTA CATH  Final   Special Requests   Final    BOTTLES DRAWN AEROBIC AND ANAEROBIC Blood Culture adequate volume   Culture   Final    NO GROWTH 2 DAYS Performed at [facility], [address] [place], [place] [address]    Report Status PENDING  Incomplete  Blood Culture (routine x 2)     Status: None (Preliminary result)   Collection Time: 02/25/2018  1:22 PM  Result Value Ref Range Status   Specimen Description   Final    BLOOD LEFT FOREARM Performed at [facility] [address], [place], [place] [address]    Special Requests   Final    BOTTLES DRAWN AEROBIC AND ANAEROBIC Blood Culture results may not be optimal due to an inadequate volume of blood received in culture bottles Performed at [facility] [address] [place], [place] [address]    Culture   Final    NO GROWTH 2 DAYS Performed at [facility] [address], [place], [place] [address]    Report Status PENDING  Incomplete  Urine culture     Status: Abnormal   Collection Time: 03/05/2018  5:16 PM  Result Value Ref Range Status   Specimen Description   Final    URINE, CLEAN CATCH Performed at [facility], [place] [address], [place], [place] [address]    Special Requests   Final    NONE Performed at [facility], [place] [address], [place], [place] [address]    Culture MULTIPLE SPECIES PRESENT, SUGGEST RECOLLECTION (A)  Final   Report Status 02/25/2018 FINAL  Final  MRSA PCR Screening     Status: None   Collection Time: 03/01/2018  8:13 PM  Result Value Ref Range Status   MRSA by PCR NEGATIVE NEGATIVE Final    Comment:        The GeneXpert MRSA Assay (FDA approved for NASAL specimens only), is one component of a comprehensive MRSA colonization surveillance program. It is not intended to diagnose MRSA infection nor to guide or monitor treatment for MRSA infections. Performed at [facility], [place] [address] [place], [place] [address]   Blood Culture (routine x 2)     Status: None (Preliminary result)   Collection Time: 02/22/2018  8:39 PM  Result Value Ref Range Status   Specimen Description   Final    BLOOD LEFT HAND Performed at [place] [address] [place], [place] [address]    Special Requests   Final    BOTTLES DRAWN AEROBIC ONLY Blood Culture adequate volume   Culture   Final    NO GROWTH 1 DAY Performed at [facility], [place] [address] [place], [place] [address]     Report Status PENDING  Incomplete  Culture, expectorated sputum-assessment     Status: None   Collection Time: 02/25/18 11:13 AM  Result Value Ref Range Status   Specimen Description SPUTUM  Final   Special Requests NONE  Final   Sputum evaluation   Final    THIS SPECIMEN IS ACCEPTABLE FOR SPUTUM  CULTURE Performed at [facility], [place] [address], [place], [place] [address]    Report Status 02/25/2018 FINAL  Final  Culture, respiratory (NON-Expectorated)     Status: None (Preliminary result)   Collection Time: 02/25/18 11:13 AM  Result Value Ref Range Status   Specimen Description   Final    SPUTUM Performed at [facility] [address] [place], [place] [address]    Special Requests   Final    NONE Reflexed from [id number] Performed at [place] [facility] [address] [place], [place] [address]    Gram Stain   Final    FEW WBC PRESENT,BOTH PMN AND MONONUCLEAR FEW SQUAMOUS EPITHELIAL CELLS PRESENT FEW GRAM POSITIVE COCCI IN CLUSTERS RARE GRAM NEGATIVE COCCI IN PAIRS RARE GRAM POSITIVE RODS    Culture   Final    CULTURE REINCUBATED FOR BETTER GROWTH Performed at [facility], [place] [address], [place], [place] [address]    Report Status PENDING  Incomplete         Radiology Studies: Dg Chest Port 1 View  Result Date: 02/26/2018 CLINICAL DATA:  Shortness of breath EXAM: PORTABLE CHEST 1 VIEW COMPARISON:  Yesterday FINDINGS: Coarse interstitial opacities on the right more than left. On the right there is extrapleural thickening that is stable. Normal heart size. Stable right-sided porta catheter positioning. IMPRESSION: Stable extensive opacity superimposed on COPD and fibrosis. Stable right-sided pleural fluid and thickening with pleural drain in place. Electronically Signed   By: Monte Fantasia M.D.   On: 02/26/2018 07:16        Scheduled Meds: . budesonide (PULMICORT) nebulizer solution  0.5 mg Nebulization BID    And  . ipratropium  0.5 mg Nebulization Q6H   And  . arformoterol  15 mcg Nebulization BID  . Chlorhexidine Gluconate Cloth  6 each Topical Daily  . diltiazem  60 mg Oral Q6H  . fluticasone  1 spray Each Nare Daily  . folic acid  1 mg Oral Daily  . furosemide  40 mg Intravenous Q12H  . guaiFENesin  600 mg Oral BID  . insulin aspart  0-20 Units Subcutaneous TID WC  . insulin aspart  0-5 Units Subcutaneous QHS  . insulin glargine  30 Units Subcutaneous QHS  . metoprolol tartrate  50 mg Oral BID  . nicotine  21 mg Transdermal Daily  . rosuvastatin  10 mg Oral Daily  . sodium chloride  1 spray Each Nare Q6H  . sodium chloride flush  10-40 mL Intracatheter Q12H   Continuous Infusions: . sodium chloride 10 mL/hr at 03/16/2018 0600  . ceFEPime (MAXIPIME) IV 2 g (03/16/18 0602)  . lactated ringers Stopped (2018/03/16 0400)  . morphine       LOS: 3 days        Aline August, MD [facility] Pager [phone]  If 7PM-7AM, please contact night-coverage [email or web address] Password [email or web address] 03-16-18, 10:00 AM

## 2018-03-21 NOTE — Progress Notes (Signed)
Patient refusing to wear NRB for transport to floor (patient on [facility] 35L/100%). Patient has been firm on not wearing a BiPAP mask for any of his prior admissions, but RT attempted to explain how NRB is different from BiPAP mask. Patient still refusing to wear mask. Patient educated on the risks involved with not wearing NRB on transport. Per RN, patient is full comfort measures at this time. Will transport patient on [place] when patient ready to transfer to floor. MD made aware.

## 2018-03-21 NOTE — Progress Notes (Signed)
Patient tolerated wearing the 100%  NRB mask during the transfer to 1616 via chair.  When arrived in 1616 the patient became anxious and wanted the mask removed.  The oxygen was changed to O2 @ 10L/[place].  Patient is struggling to breathe.  Morphine drip increased to 15 mg /hr.  Family at bedside and want the patient more comfortable.  Ativan drip to start when available.  Cont to monitor patient.  Wayde Gopaul Roselie Awkward RN

## 2018-03-21 DEATH — deceased

## 2018-03-23 ENCOUNTER — Other Ambulatory Visit: Payer: Medicare Other

## 2018-03-28 ENCOUNTER — Other Ambulatory Visit: Payer: Self-pay | Admitting: Endocrinology

## 2018-03-31 ENCOUNTER — Ambulatory Visit: Payer: Medicare Other | Admitting: Internal Medicine

## 2018-03-31 ENCOUNTER — Ambulatory Visit: Payer: Medicare Other

## 2018-03-31 ENCOUNTER — Other Ambulatory Visit: Payer: Medicare Other

## 2018-04-05 ENCOUNTER — Ambulatory Visit: Payer: Medicare Other | Admitting: Internal Medicine

## 2018-04-07 ENCOUNTER — Other Ambulatory Visit: Payer: Medicare Other

## 2018-04-14 ENCOUNTER — Other Ambulatory Visit: Payer: Medicare Other

## 2018-04-19 ENCOUNTER — Ambulatory Visit: Payer: Medicare Other | Admitting: Physician Assistant

## 2018-04-21 ENCOUNTER — Ambulatory Visit: Payer: Medicare Other | Admitting: Internal Medicine

## 2018-04-21 ENCOUNTER — Ambulatory Visit: Payer: Medicare Other

## 2018-04-21 ENCOUNTER — Other Ambulatory Visit: Payer: Medicare Other

## 2018-05-05 ENCOUNTER — Other Ambulatory Visit: Payer: Self-pay | Admitting: Endocrinology

## 2018-06-18 IMAGING — CR DG CHEST 2V
2 series · 2 of 2 positions shown · non-contrast
Comparison: 10/15/2017

CLINICAL DATA: Shortness of breath and lower extremity swelling for
3 weeks.

EXAM:
CHEST - 2 VIEW

[w chest pa]
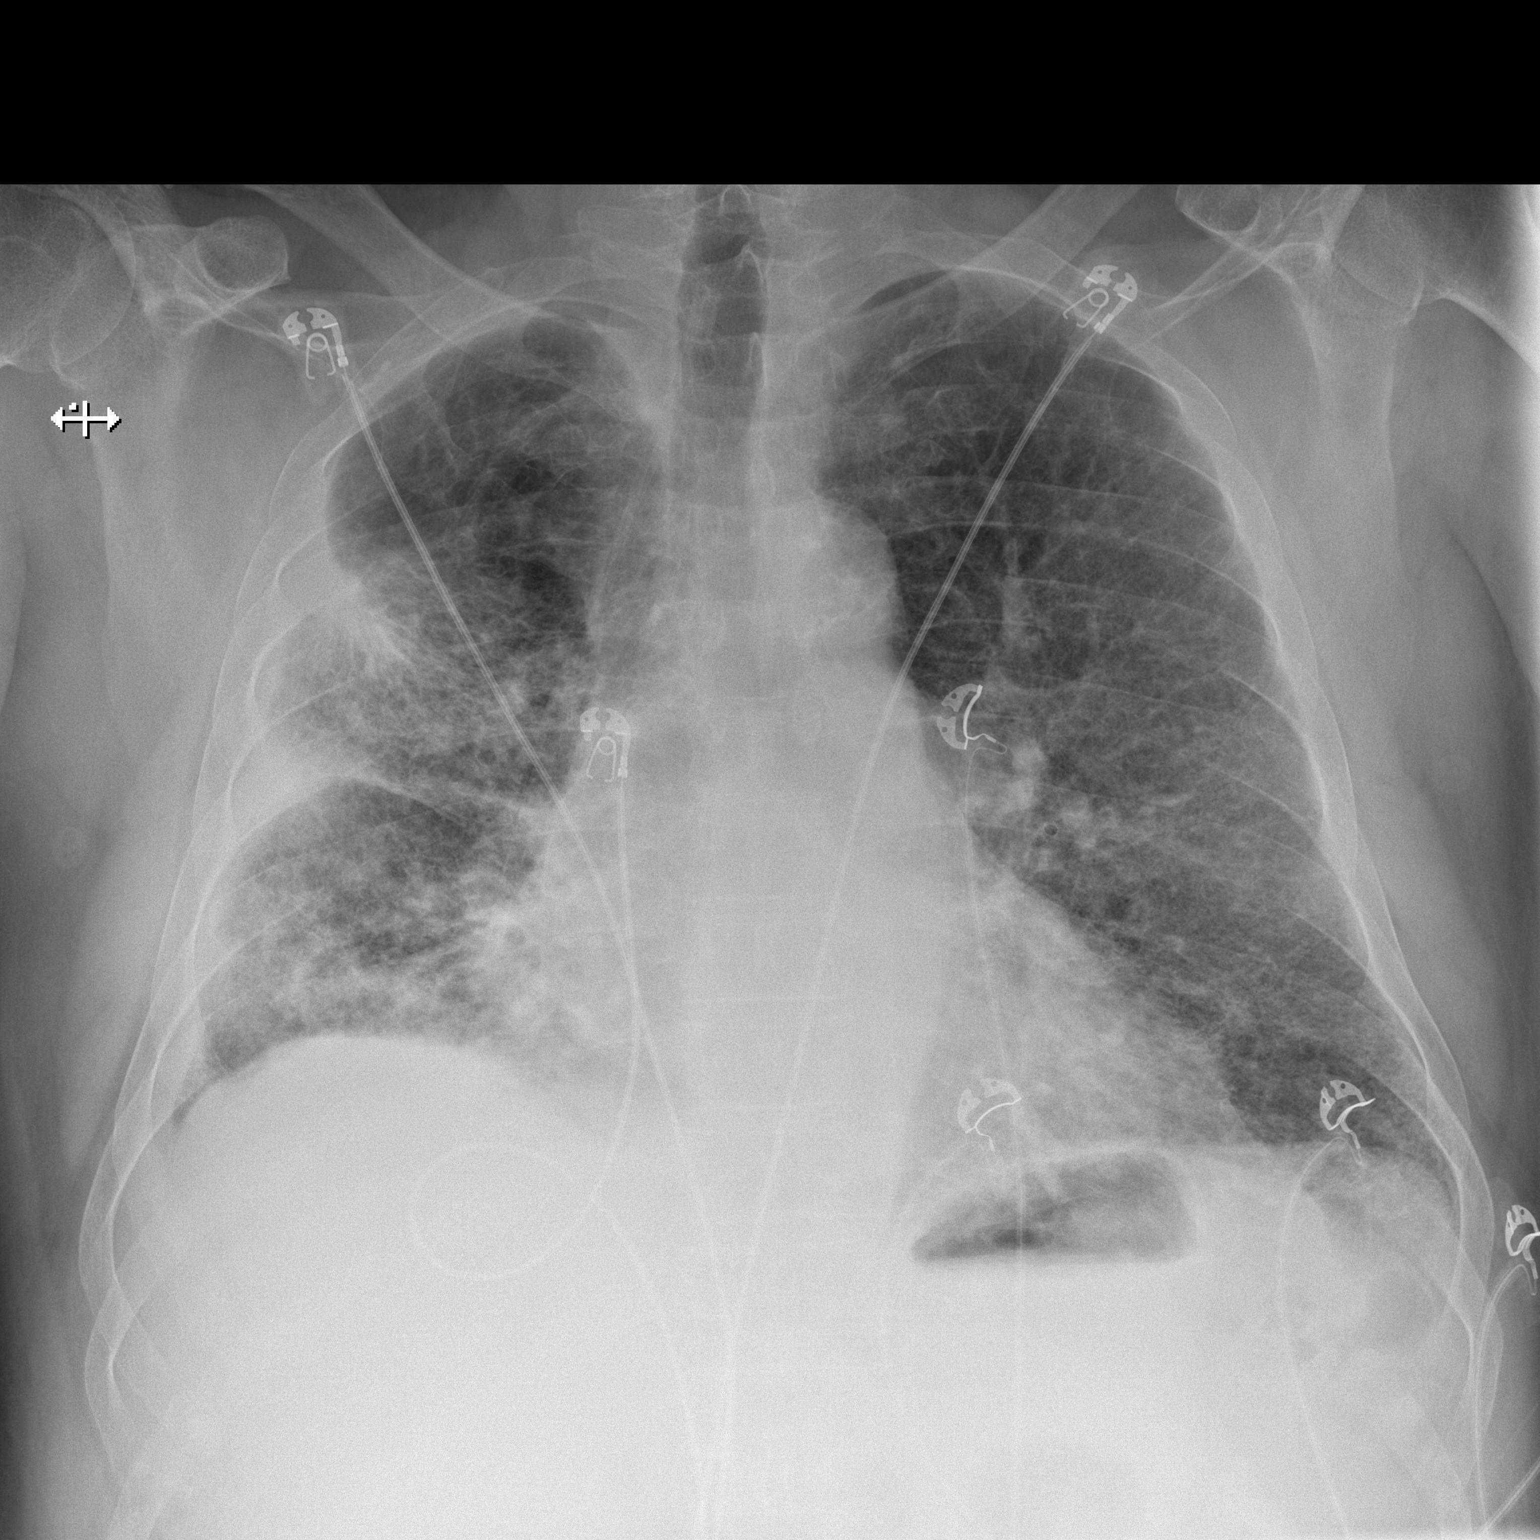

[w chest lat]
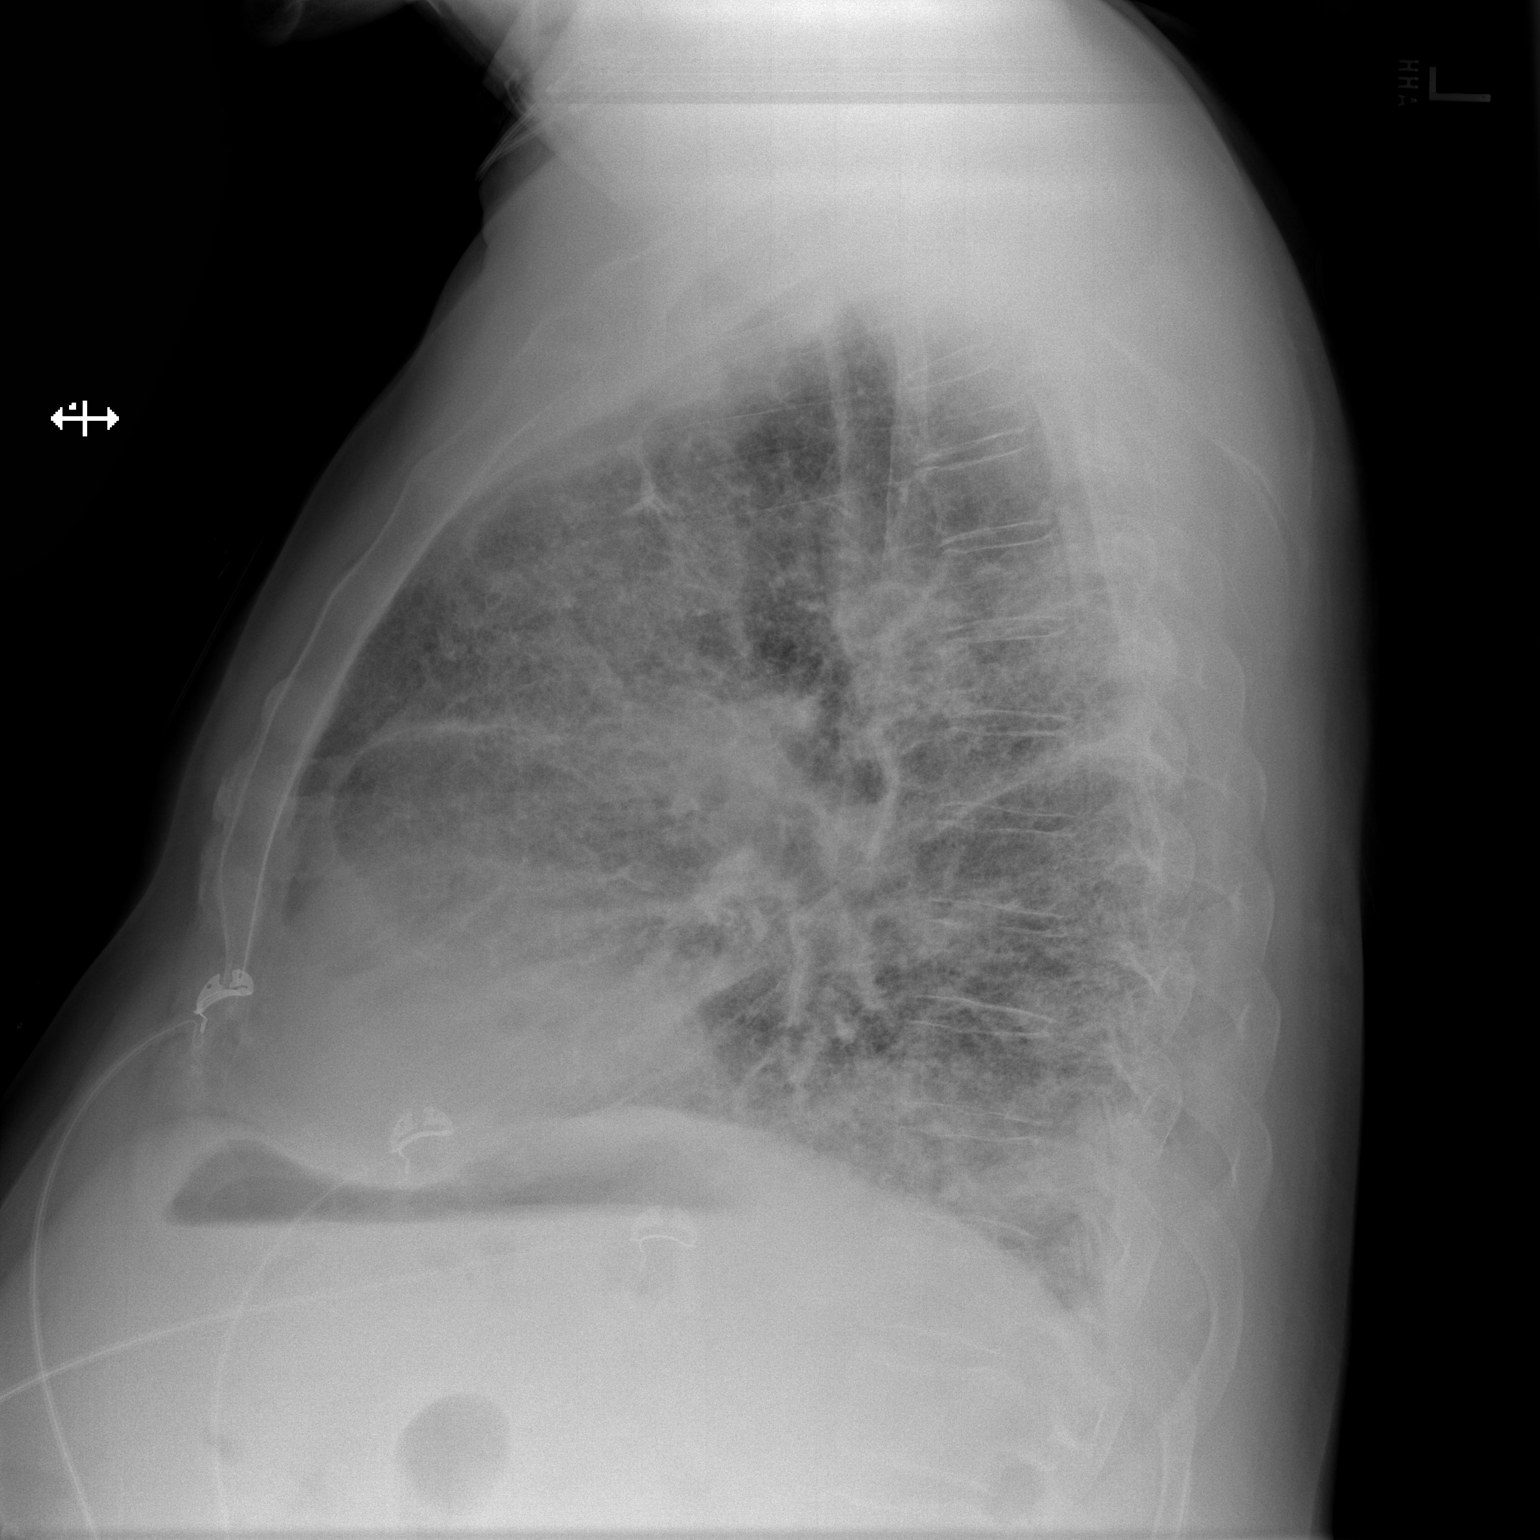

[2 of 2 positions shown; findings below may reference images not displayed]

FINDINGS: Interval progression of pleuroparenchymal opacity in the right
lateral mid hemithorax. Diffuse underlying interstitial lung disease
again noted. Cardiopericardial silhouette is at upper limits of
normal for size. The visualized bony structures of the thorax are
intact. Telemetry leads overlie the chest.
IMPRESSION: Interval progression of lateral pleuroparenchymal opacity in the
right mid lung. CT chest may be warranted to further evaluate.

Underlying changes of chronic interstitial lung disease.

## 2018-08-21 IMAGING — DX DG CHEST 1V PORT
1 series · 1 of 1 positions shown · non-contrast
Comparison: Yesterday

CLINICAL DATA: Shortness of breath

EXAM:
PORTABLE CHEST 1 VIEW

[chest ap]
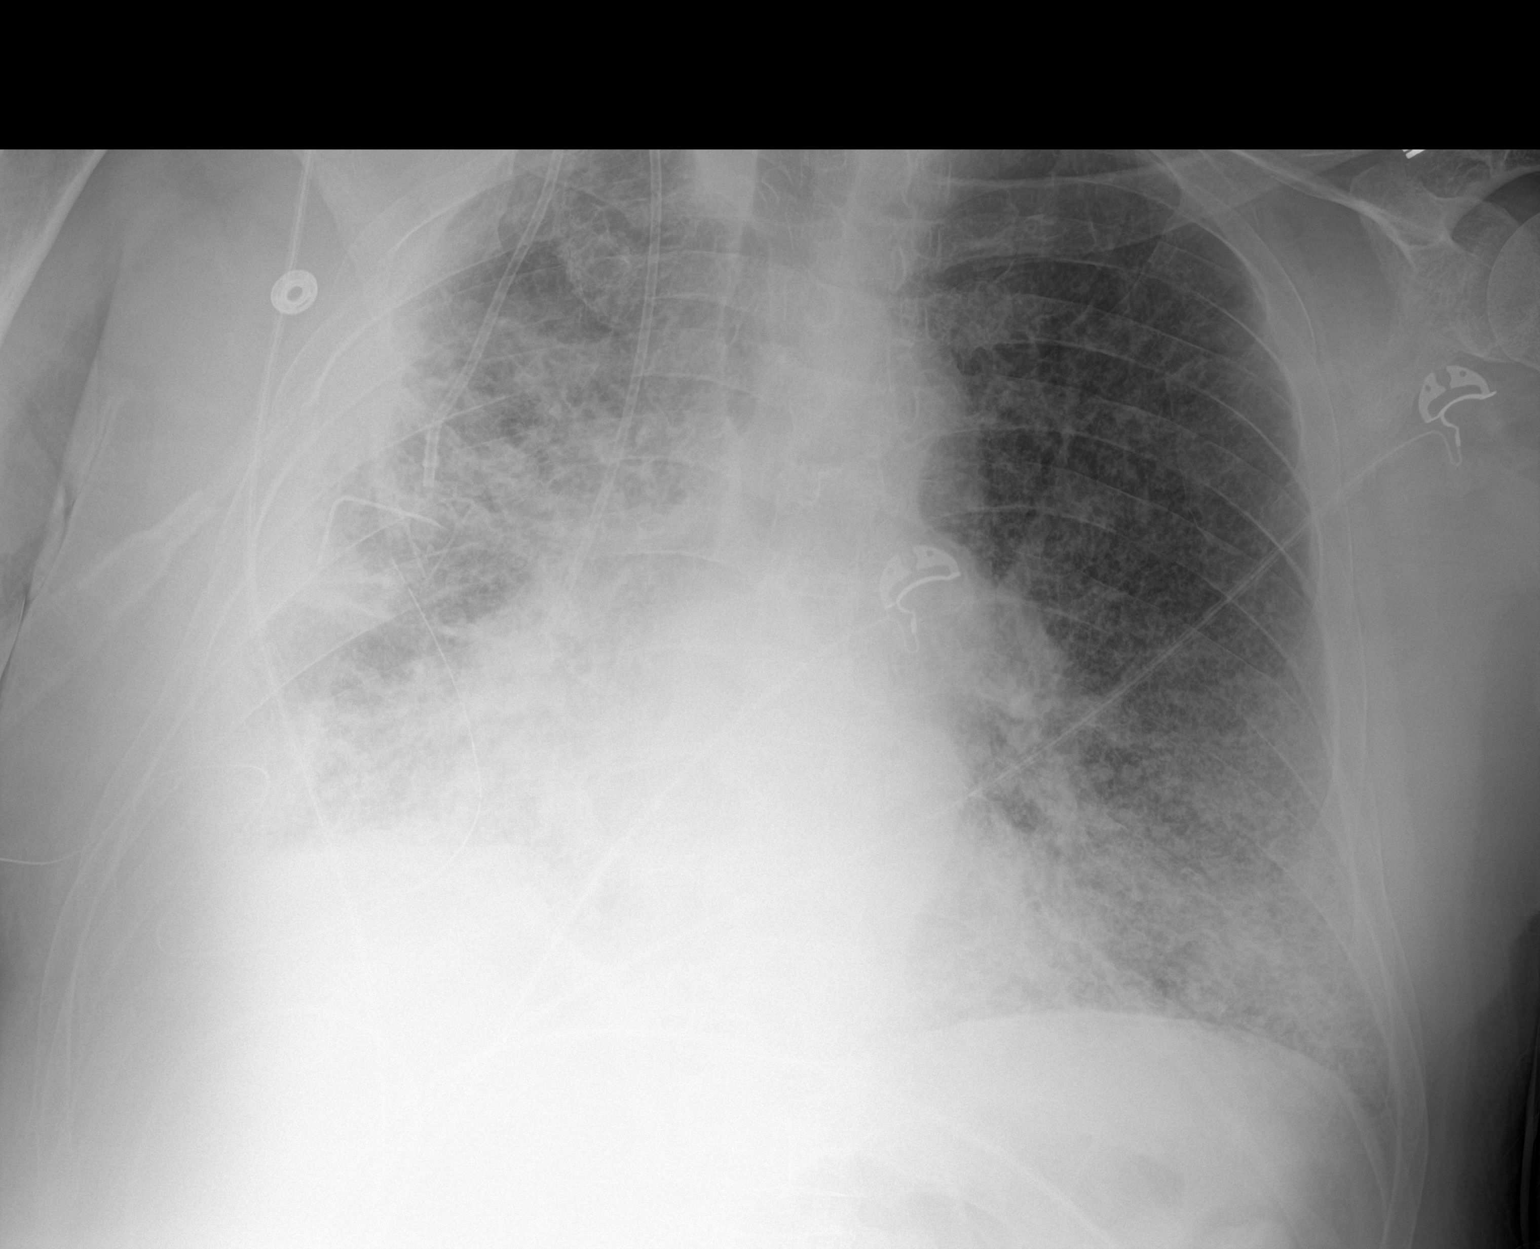

[1 of 1 positions shown; findings below may reference images not displayed]

FINDINGS: Coarse interstitial opacities on the right more than left. On the
right there is extrapleural thickening that is stable. Normal heart
size. Stable right-sided porta catheter positioning.
IMPRESSION: Stable extensive opacity superimposed on COPD and fibrosis. Stable
right-sided pleural fluid and thickening with pleural drain in
place.
